# Patient Record
Sex: Female | Born: 1935 | Race: White | Hispanic: No | Smoking: Never smoker
Health system: Southern US, Community
[De-identification: ages and names within clinical notes are randomized; demographics above are authoritative.]

## PROBLEM LIST (undated history)

## (undated) DIAGNOSIS — I6350 Cerebral infarction due to unspecified occlusion or stenosis of unspecified cerebral artery: Secondary | ICD-10-CM

## (undated) DIAGNOSIS — I739 Peripheral vascular disease, unspecified: Secondary | ICD-10-CM

## (undated) DIAGNOSIS — I447 Left bundle-branch block, unspecified: Secondary | ICD-10-CM

## (undated) DIAGNOSIS — I5042 Chronic combined systolic (congestive) and diastolic (congestive) heart failure: Secondary | ICD-10-CM

## (undated) DIAGNOSIS — Z8619 Personal history of other infectious and parasitic diseases: Secondary | ICD-10-CM

## (undated) DIAGNOSIS — I6529 Occlusion and stenosis of unspecified carotid artery: Secondary | ICD-10-CM

## (undated) DIAGNOSIS — I10 Essential (primary) hypertension: Secondary | ICD-10-CM

## (undated) DIAGNOSIS — I350 Nonrheumatic aortic (valve) stenosis: Secondary | ICD-10-CM

## (undated) DIAGNOSIS — M1990 Unspecified osteoarthritis, unspecified site: Secondary | ICD-10-CM

## (undated) DIAGNOSIS — S82409A Unspecified fracture of shaft of unspecified fibula, initial encounter for closed fracture: Secondary | ICD-10-CM

## (undated) DIAGNOSIS — D509 Iron deficiency anemia, unspecified: Secondary | ICD-10-CM

## (undated) DIAGNOSIS — E785 Hyperlipidemia, unspecified: Secondary | ICD-10-CM

## (undated) DIAGNOSIS — I340 Nonrheumatic mitral (valve) insufficiency: Secondary | ICD-10-CM

## (undated) DIAGNOSIS — Z953 Presence of xenogenic heart valve: Secondary | ICD-10-CM

## (undated) HISTORY — DX: Cerebral infarction due to unspecified occlusion or stenosis of unspecified cerebral artery: I63.50

## (undated) HISTORY — DX: Left bundle-branch block, unspecified: I44.7

## (undated) HISTORY — DX: Iron deficiency anemia, unspecified: D50.9

## (undated) HISTORY — PX: PACEMAKER INSERTION: SHX728

## (undated) HISTORY — DX: Nonrheumatic aortic (valve) stenosis: I35.0

## (undated) HISTORY — DX: Nonrheumatic mitral (valve) insufficiency: I34.0

## (undated) HISTORY — PX: COLONOSCOPY: SHX174

## (undated) HISTORY — DX: Peripheral vascular disease, unspecified: I73.9

## (undated) HISTORY — DX: Occlusion and stenosis of unspecified carotid artery: I65.29

---

## 2000-04-03 ENCOUNTER — Encounter: Payer: Self-pay | Admitting: Internal Medicine

## 2000-04-03 ENCOUNTER — Encounter: Admission: RE | Admit: 2000-04-03 | Discharge: 2000-04-03 | Payer: Self-pay | Admitting: Internal Medicine

## 2001-04-09 ENCOUNTER — Encounter: Payer: Self-pay | Admitting: Internal Medicine

## 2001-04-09 ENCOUNTER — Encounter: Admission: RE | Admit: 2001-04-09 | Discharge: 2001-04-09 | Payer: Self-pay | Admitting: Internal Medicine

## 2001-04-30 ENCOUNTER — Other Ambulatory Visit: Admission: RE | Admit: 2001-04-30 | Discharge: 2001-04-30 | Payer: Self-pay | Admitting: Gynecology

## 2002-04-15 ENCOUNTER — Encounter: Payer: Self-pay | Admitting: Internal Medicine

## 2002-04-15 ENCOUNTER — Encounter: Admission: RE | Admit: 2002-04-15 | Discharge: 2002-04-15 | Payer: Self-pay | Admitting: Internal Medicine

## 2002-11-11 ENCOUNTER — Other Ambulatory Visit: Admission: RE | Admit: 2002-11-11 | Discharge: 2002-11-11 | Payer: Self-pay | Admitting: Gynecology

## 2003-04-21 ENCOUNTER — Encounter: Admission: RE | Admit: 2003-04-21 | Discharge: 2003-04-21 | Payer: Self-pay | Admitting: Internal Medicine

## 2003-04-21 ENCOUNTER — Encounter: Payer: Self-pay | Admitting: Internal Medicine

## 2003-11-17 ENCOUNTER — Other Ambulatory Visit: Admission: RE | Admit: 2003-11-17 | Discharge: 2003-11-17 | Payer: Self-pay | Admitting: Gynecology

## 2003-11-27 ENCOUNTER — Encounter: Admission: RE | Admit: 2003-11-27 | Discharge: 2003-11-27 | Payer: Self-pay | Admitting: Internal Medicine

## 2003-11-27 ENCOUNTER — Emergency Department (HOSPITAL_COMMUNITY): Admission: EM | Admit: 2003-11-27 | Discharge: 2003-11-28 | Payer: Self-pay | Admitting: Emergency Medicine

## 2003-12-10 ENCOUNTER — Ambulatory Visit (HOSPITAL_COMMUNITY): Admission: RE | Admit: 2003-12-10 | Discharge: 2003-12-10 | Payer: Self-pay | Admitting: Internal Medicine

## 2004-05-03 ENCOUNTER — Encounter: Admission: RE | Admit: 2004-05-03 | Discharge: 2004-05-03 | Payer: Self-pay | Admitting: Internal Medicine

## 2004-11-01 ENCOUNTER — Other Ambulatory Visit: Admission: RE | Admit: 2004-11-01 | Discharge: 2004-11-01 | Payer: Self-pay | Admitting: Gynecology

## 2004-11-13 ENCOUNTER — Ambulatory Visit: Payer: Self-pay | Admitting: Internal Medicine

## 2004-11-15 ENCOUNTER — Ambulatory Visit: Payer: Self-pay | Admitting: Internal Medicine

## 2004-12-06 ENCOUNTER — Ambulatory Visit: Payer: Self-pay | Admitting: Internal Medicine

## 2004-12-20 ENCOUNTER — Ambulatory Visit: Payer: Self-pay | Admitting: Internal Medicine

## 2004-12-27 ENCOUNTER — Ambulatory Visit: Payer: Self-pay | Admitting: Internal Medicine

## 2004-12-27 HISTORY — PX: POLYPECTOMY: SHX149

## 2005-05-09 ENCOUNTER — Encounter: Admission: RE | Admit: 2005-05-09 | Discharge: 2005-05-09 | Payer: Self-pay | Admitting: Internal Medicine

## 2005-08-15 ENCOUNTER — Ambulatory Visit: Payer: Self-pay | Admitting: Internal Medicine

## 2005-09-12 ENCOUNTER — Ambulatory Visit: Payer: Self-pay | Admitting: Internal Medicine

## 2005-10-31 ENCOUNTER — Ambulatory Visit: Payer: Self-pay | Admitting: Internal Medicine

## 2005-11-07 ENCOUNTER — Other Ambulatory Visit: Admission: RE | Admit: 2005-11-07 | Discharge: 2005-11-07 | Payer: Self-pay | Admitting: Gynecology

## 2005-12-12 ENCOUNTER — Ambulatory Visit: Payer: Self-pay | Admitting: Internal Medicine

## 2006-01-30 ENCOUNTER — Ambulatory Visit: Payer: Self-pay | Admitting: Internal Medicine

## 2006-03-02 ENCOUNTER — Ambulatory Visit: Payer: Self-pay | Admitting: Internal Medicine

## 2006-05-15 ENCOUNTER — Encounter: Admission: RE | Admit: 2006-05-15 | Discharge: 2006-05-15 | Payer: Self-pay | Admitting: Internal Medicine

## 2006-05-29 ENCOUNTER — Encounter: Admission: RE | Admit: 2006-05-29 | Discharge: 2006-05-29 | Payer: Self-pay | Admitting: Internal Medicine

## 2006-10-20 ENCOUNTER — Encounter: Payer: Self-pay | Admitting: Internal Medicine

## 2006-11-13 ENCOUNTER — Other Ambulatory Visit: Admission: RE | Admit: 2006-11-13 | Discharge: 2006-11-13 | Payer: Self-pay | Admitting: Gynecology

## 2006-11-20 ENCOUNTER — Ambulatory Visit: Payer: Self-pay | Admitting: Internal Medicine

## 2006-11-20 LAB — CONVERTED CEMR LAB
BUN: 12 mg/dL (ref 6–23)
Basophils Absolute: 0 10*3/uL (ref 0.0–0.1)
CO2: 30 meq/L (ref 19–32)
Calcium: 9.9 mg/dL (ref 8.4–10.5)
Chloride: 98 meq/L (ref 96–112)
Cholesterol: 171 mg/dL (ref 0–200)
Eosinophils Relative: 2.6 % (ref 0.0–5.0)
Glucose, Bld: 158 mg/dL — ABNORMAL HIGH (ref 70–99)
HCT: 33.8 % — ABNORMAL LOW (ref 36.0–46.0)
HDL: 85.8 mg/dL (ref >39.0)
LDL Cholesterol: 68 mg/dL (ref 0–99)
Lymphocytes Relative: 23.4 % (ref 12.0–46.0)
MCV: 89.5 fL (ref 78.0–100.0)
Neutro Abs: 5.5 10*3/uL (ref 1.4–7.7)
Neutrophils Relative %: 68.2 % (ref 43.0–77.0)
Nitrite: NEGATIVE
Platelets: 340 10*3/uL (ref 150–400)
Potassium: 4.6 meq/L (ref 3.5–5.1)
RBC: 3.78 M/uL — ABNORMAL LOW (ref 3.87–5.11)
Specific Gravity, Urine: 1.01 (ref 1.000–1.03)
TSH: 1.32 microintl units/mL (ref 0.35–5.50)
Total Protein: 6.9 g/dL (ref 6.0–8.3)
pH: 7.5 (ref 5.0–8.0)

## 2006-12-04 ENCOUNTER — Ambulatory Visit: Payer: Self-pay | Admitting: Internal Medicine

## 2006-12-08 ENCOUNTER — Ambulatory Visit: Payer: Self-pay

## 2006-12-10 ENCOUNTER — Ambulatory Visit: Payer: Self-pay | Admitting: Cardiology

## 2006-12-25 ENCOUNTER — Ambulatory Visit: Payer: Self-pay

## 2006-12-25 ENCOUNTER — Encounter: Payer: Self-pay | Admitting: Cardiology

## 2007-01-12 ENCOUNTER — Ambulatory Visit: Payer: Self-pay | Admitting: Cardiology

## 2007-01-12 LAB — CONVERTED CEMR LAB
CO2: 30 meq/L (ref 19–32)
Chloride: 98 meq/L (ref 96–112)
Eosinophils Absolute: 0.2 10*3/uL (ref 0.0–0.6)
GFR calc Af Amer: 105 mL/min
HCT: 33.5 % — ABNORMAL LOW (ref 36.0–46.0)
INR: 1 (ref 0.9–2.0)
Lymphocytes Relative: 25.6 % (ref 12.0–46.0)
Monocytes Relative: 6 % (ref 3.0–11.0)
Platelets: 282 10*3/uL (ref 150–400)
Potassium: 4 meq/L (ref 3.5–5.1)
Prothrombin Time: 11.8 s (ref 10.0–14.0)
RBC: 3.8 M/uL — ABNORMAL LOW (ref 3.87–5.11)
Sodium: 135 meq/L (ref 135–145)
WBC: 8 10*3/uL (ref 4.5–10.5)
aPTT: 25 s — ABNORMAL LOW (ref 26.5–36.5)

## 2007-01-14 ENCOUNTER — Ambulatory Visit: Payer: Self-pay | Admitting: Cardiology

## 2007-01-14 ENCOUNTER — Inpatient Hospital Stay (HOSPITAL_BASED_OUTPATIENT_CLINIC_OR_DEPARTMENT_OTHER): Admission: RE | Admit: 2007-01-14 | Discharge: 2007-01-14 | Payer: Self-pay | Admitting: Cardiology

## 2007-02-08 ENCOUNTER — Ambulatory Visit: Payer: Self-pay | Admitting: Cardiology

## 2007-05-21 ENCOUNTER — Ambulatory Visit: Payer: Self-pay | Admitting: Internal Medicine

## 2007-05-21 LAB — CONVERTED CEMR LAB
AST: 17 units/L (ref 0–37)
Glucose, Bld: 275 mg/dL — ABNORMAL HIGH (ref 70–99)
HDL: 87.6 mg/dL (ref >39.0)
Hgb A1c MFr Bld: 12.6 % — ABNORMAL HIGH (ref 4.6–6.0)
Total CHOL/HDL Ratio: 2.5
Triglycerides: 99 mg/dL (ref 0–149)
VLDL: 20 mg/dL (ref 0–40)

## 2007-06-04 ENCOUNTER — Encounter: Admission: RE | Admit: 2007-06-04 | Discharge: 2007-06-04 | Payer: Self-pay | Admitting: Internal Medicine

## 2007-06-18 ENCOUNTER — Ambulatory Visit: Payer: Self-pay | Admitting: Internal Medicine

## 2007-07-30 ENCOUNTER — Encounter: Payer: Self-pay | Admitting: Internal Medicine

## 2007-07-30 DIAGNOSIS — I739 Peripheral vascular disease, unspecified: Secondary | ICD-10-CM

## 2007-07-30 DIAGNOSIS — E1129 Type 2 diabetes mellitus with other diabetic kidney complication: Secondary | ICD-10-CM

## 2007-07-30 DIAGNOSIS — I10 Essential (primary) hypertension: Secondary | ICD-10-CM

## 2007-07-30 DIAGNOSIS — I447 Left bundle-branch block, unspecified: Secondary | ICD-10-CM | POA: Insufficient documentation

## 2007-07-30 DIAGNOSIS — E785 Hyperlipidemia, unspecified: Secondary | ICD-10-CM

## 2007-07-30 DIAGNOSIS — Z9189 Other specified personal risk factors, not elsewhere classified: Secondary | ICD-10-CM | POA: Insufficient documentation

## 2007-07-30 HISTORY — DX: Peripheral vascular disease, unspecified: I73.9

## 2007-09-10 ENCOUNTER — Telehealth: Payer: Self-pay | Admitting: Internal Medicine

## 2007-09-17 ENCOUNTER — Ambulatory Visit: Payer: Self-pay | Admitting: Internal Medicine

## 2007-09-17 DIAGNOSIS — E783 Hyperchylomicronemia: Secondary | ICD-10-CM | POA: Insufficient documentation

## 2007-09-17 DIAGNOSIS — T7841XA Arthus phenomenon, initial encounter: Secondary | ICD-10-CM | POA: Insufficient documentation

## 2007-09-17 LAB — CONVERTED CEMR LAB
AST: 19 units/L (ref 0–37)
Bilirubin, Direct: 0.1 mg/dL (ref 0.0–0.3)
CO2: 30 meq/L (ref 19–32)
Cholesterol: 170 mg/dL (ref 0–200)
GFR calc non Af Amer: 74 mL/min
Glucose, Bld: 148 mg/dL — ABNORMAL HIGH (ref 70–99)
Hgb A1c MFr Bld: 7.8 % — ABNORMAL HIGH (ref 4.6–6.0)
Sodium: 137 meq/L (ref 135–145)
Total CHOL/HDL Ratio: 1.8
Total Protein: 6.8 g/dL (ref 6.0–8.3)
Triglycerides: 50 mg/dL (ref 0–149)
VLDL: 10 mg/dL (ref 0–40)

## 2007-09-19 ENCOUNTER — Encounter: Payer: Self-pay | Admitting: Internal Medicine

## 2007-09-29 ENCOUNTER — Ambulatory Visit: Payer: Self-pay | Admitting: Internal Medicine

## 2007-09-29 DIAGNOSIS — B029 Zoster without complications: Secondary | ICD-10-CM | POA: Insufficient documentation

## 2007-10-21 HISTORY — PX: CATARACT EXTRACTION: SUR2

## 2007-10-22 ENCOUNTER — Ambulatory Visit: Payer: Self-pay | Admitting: Internal Medicine

## 2007-12-17 ENCOUNTER — Encounter: Payer: Self-pay | Admitting: Internal Medicine

## 2008-02-11 ENCOUNTER — Ambulatory Visit: Payer: Self-pay | Admitting: Internal Medicine

## 2008-02-24 ENCOUNTER — Encounter: Payer: Self-pay | Admitting: Internal Medicine

## 2008-02-24 ENCOUNTER — Ambulatory Visit: Payer: Self-pay | Admitting: Internal Medicine

## 2008-02-29 ENCOUNTER — Encounter: Payer: Self-pay | Admitting: Internal Medicine

## 2008-04-07 ENCOUNTER — Encounter (INDEPENDENT_AMBULATORY_CARE_PROVIDER_SITE_OTHER): Payer: Self-pay | Admitting: *Deleted

## 2008-04-07 ENCOUNTER — Ambulatory Visit: Payer: Self-pay | Admitting: Internal Medicine

## 2008-04-07 DIAGNOSIS — L259 Unspecified contact dermatitis, unspecified cause: Secondary | ICD-10-CM | POA: Insufficient documentation

## 2008-04-07 DIAGNOSIS — R609 Edema, unspecified: Secondary | ICD-10-CM

## 2008-04-07 LAB — CONVERTED CEMR LAB
ALT: 15 units/L (ref 0–35)
AST: 22 units/L (ref 0–37)
Albumin: 4.2 g/dL (ref 3.5–5.2)
CO2: 30 meq/L (ref 19–32)
Calcium: 9.7 mg/dL (ref 8.4–10.5)
Cholesterol: 171 mg/dL (ref 0–200)
GFR calc Af Amer: 78 mL/min
GFR calc non Af Amer: 65 mL/min
HDL: 85 mg/dL (ref >39.0)
Hgb A1c MFr Bld: 6.9 % — ABNORMAL HIGH (ref 4.6–6.0)
Potassium: 4.2 meq/L (ref 3.5–5.1)

## 2008-04-11 ENCOUNTER — Encounter: Payer: Self-pay | Admitting: Internal Medicine

## 2008-04-12 ENCOUNTER — Encounter: Payer: Self-pay | Admitting: Internal Medicine

## 2008-06-02 ENCOUNTER — Telehealth: Payer: Self-pay | Admitting: Internal Medicine

## 2008-06-09 ENCOUNTER — Encounter: Admission: RE | Admit: 2008-06-09 | Discharge: 2008-06-09 | Payer: Self-pay | Admitting: Internal Medicine

## 2008-06-16 ENCOUNTER — Ambulatory Visit: Payer: Self-pay | Admitting: Cardiology

## 2008-07-03 ENCOUNTER — Encounter: Payer: Self-pay | Admitting: Internal Medicine

## 2008-07-14 ENCOUNTER — Ambulatory Visit: Payer: Self-pay | Admitting: Internal Medicine

## 2008-08-25 ENCOUNTER — Ambulatory Visit: Payer: Self-pay | Admitting: Internal Medicine

## 2008-08-25 LAB — CONVERTED CEMR LAB
ALT: 14 units/L (ref 0–35)
Cholesterol: 170 mg/dL (ref 0–200)
HDL: 92.4 mg/dL (ref >39.0)
Hgb A1c MFr Bld: 6.8 % — ABNORMAL HIGH (ref 4.6–6.0)
Total CHOL/HDL Ratio: 1.8
Total Protein: 7.1 g/dL (ref 6.0–8.3)
Triglycerides: 28 mg/dL (ref 0–149)

## 2008-08-29 ENCOUNTER — Encounter: Payer: Self-pay | Admitting: Internal Medicine

## 2008-09-08 ENCOUNTER — Encounter: Payer: Self-pay | Admitting: Internal Medicine

## 2008-10-18 ENCOUNTER — Telehealth: Payer: Self-pay | Admitting: Internal Medicine

## 2008-12-22 ENCOUNTER — Encounter: Payer: Self-pay | Admitting: Internal Medicine

## 2008-12-29 ENCOUNTER — Ambulatory Visit: Payer: Self-pay

## 2009-01-05 ENCOUNTER — Encounter: Payer: Self-pay | Admitting: Cardiology

## 2009-01-05 ENCOUNTER — Ambulatory Visit: Payer: Self-pay | Admitting: Cardiology

## 2009-01-05 DIAGNOSIS — I6529 Occlusion and stenosis of unspecified carotid artery: Secondary | ICD-10-CM

## 2009-01-05 DIAGNOSIS — I739 Peripheral vascular disease, unspecified: Secondary | ICD-10-CM

## 2009-01-05 HISTORY — DX: Occlusion and stenosis of unspecified carotid artery: I65.29

## 2009-01-26 ENCOUNTER — Encounter: Payer: Self-pay | Admitting: Cardiology

## 2009-01-26 ENCOUNTER — Ambulatory Visit: Payer: Self-pay

## 2009-03-23 ENCOUNTER — Ambulatory Visit: Payer: Self-pay | Admitting: Cardiology

## 2009-03-23 DIAGNOSIS — I272 Pulmonary hypertension, unspecified: Secondary | ICD-10-CM | POA: Insufficient documentation

## 2009-06-29 ENCOUNTER — Encounter: Admission: RE | Admit: 2009-06-29 | Discharge: 2009-06-29 | Payer: Self-pay | Admitting: Internal Medicine

## 2009-07-06 ENCOUNTER — Ambulatory Visit: Payer: Self-pay | Admitting: Cardiology

## 2009-09-21 ENCOUNTER — Encounter: Payer: Self-pay | Admitting: Internal Medicine

## 2009-09-25 ENCOUNTER — Telehealth: Payer: Self-pay | Admitting: Internal Medicine

## 2009-10-05 ENCOUNTER — Telehealth: Payer: Self-pay | Admitting: Internal Medicine

## 2009-10-05 ENCOUNTER — Ambulatory Visit: Payer: Self-pay | Admitting: Internal Medicine

## 2009-10-20 HISTORY — PX: CAROTID ENDARTERECTOMY: SUR193

## 2009-10-26 LAB — CONVERTED CEMR LAB
ALT: 13 units/L (ref 0–35)
Bilirubin, Direct: 0.1 mg/dL (ref 0.0–0.3)
Calcium: 9.8 mg/dL (ref 8.4–10.5)
Cholesterol: 187 mg/dL (ref 0–200)
Creatinine, Ser: 0.9 mg/dL (ref 0.4–1.2)
Glucose, Bld: 140 mg/dL — ABNORMAL HIGH (ref 70–99)
LDL Cholesterol: 89 mg/dL (ref 0–99)
Sodium: 143 meq/L (ref 135–145)
TSH: 1.47 microintl units/mL (ref 0.35–5.50)
VLDL: 10.2 mg/dL (ref 0.0–40.0)

## 2009-10-28 ENCOUNTER — Encounter: Payer: Self-pay | Admitting: Internal Medicine

## 2009-12-21 ENCOUNTER — Ambulatory Visit: Payer: Self-pay

## 2009-12-21 ENCOUNTER — Encounter: Payer: Self-pay | Admitting: Cardiology

## 2009-12-28 ENCOUNTER — Encounter: Payer: Self-pay | Admitting: Internal Medicine

## 2010-02-21 ENCOUNTER — Encounter: Payer: Self-pay | Admitting: Cardiology

## 2010-02-21 DIAGNOSIS — I359 Nonrheumatic aortic valve disorder, unspecified: Secondary | ICD-10-CM | POA: Insufficient documentation

## 2010-02-22 ENCOUNTER — Ambulatory Visit: Payer: Self-pay | Admitting: Cardiology

## 2010-03-04 ENCOUNTER — Encounter: Payer: Self-pay | Admitting: Cardiology

## 2010-03-12 ENCOUNTER — Encounter: Payer: Self-pay | Admitting: Cardiology

## 2010-03-12 ENCOUNTER — Telehealth: Payer: Self-pay | Admitting: Internal Medicine

## 2010-03-14 ENCOUNTER — Ambulatory Visit: Payer: Self-pay | Admitting: Cardiology

## 2010-03-14 DIAGNOSIS — I6350 Cerebral infarction due to unspecified occlusion or stenosis of unspecified cerebral artery: Secondary | ICD-10-CM

## 2010-03-14 HISTORY — DX: Cerebral infarction due to unspecified occlusion or stenosis of unspecified cerebral artery: I63.50

## 2010-03-15 ENCOUNTER — Ambulatory Visit: Payer: Self-pay | Admitting: Cardiology

## 2010-03-15 LAB — CONVERTED CEMR LAB
CO2: 31 meq/L (ref 19–32)
Calcium: 9.9 mg/dL (ref 8.4–10.5)
Chloride: 103 meq/L (ref 96–112)
Creatinine, Ser: 1 mg/dL (ref 0.4–1.2)
Sodium: 139 meq/L (ref 135–145)

## 2010-03-21 ENCOUNTER — Ambulatory Visit: Payer: Self-pay | Admitting: Vascular Surgery

## 2010-03-21 ENCOUNTER — Encounter: Payer: Self-pay | Admitting: Cardiology

## 2010-03-22 ENCOUNTER — Ambulatory Visit: Payer: Self-pay | Admitting: Vascular Surgery

## 2010-03-22 ENCOUNTER — Encounter: Payer: Self-pay | Admitting: Vascular Surgery

## 2010-03-22 ENCOUNTER — Inpatient Hospital Stay (HOSPITAL_COMMUNITY): Admission: RE | Admit: 2010-03-22 | Discharge: 2010-03-23 | Payer: Self-pay | Admitting: Vascular Surgery

## 2010-04-04 ENCOUNTER — Encounter: Admission: RE | Admit: 2010-04-04 | Discharge: 2010-04-04 | Payer: Self-pay | Admitting: Vascular Surgery

## 2010-04-11 ENCOUNTER — Ambulatory Visit: Payer: Self-pay | Admitting: Vascular Surgery

## 2010-04-11 ENCOUNTER — Encounter: Payer: Self-pay | Admitting: Internal Medicine

## 2010-07-05 ENCOUNTER — Encounter: Admission: RE | Admit: 2010-07-05 | Discharge: 2010-07-05 | Payer: Self-pay | Admitting: Internal Medicine

## 2010-07-05 LAB — HM MAMMOGRAPHY: HM Mammogram: NEGATIVE

## 2010-09-01 ENCOUNTER — Encounter: Payer: Self-pay | Admitting: Internal Medicine

## 2010-09-05 ENCOUNTER — Encounter: Payer: Self-pay | Admitting: Cardiology

## 2010-09-06 ENCOUNTER — Ambulatory Visit: Payer: Self-pay | Admitting: Cardiology

## 2010-09-27 ENCOUNTER — Ambulatory Visit: Payer: Self-pay

## 2010-09-27 ENCOUNTER — Encounter: Payer: Self-pay | Admitting: Cardiology

## 2010-09-27 ENCOUNTER — Ambulatory Visit (HOSPITAL_COMMUNITY)
Admission: RE | Admit: 2010-09-27 | Discharge: 2010-09-27 | Payer: Self-pay | Source: Home / Self Care | Attending: Cardiology | Admitting: Cardiology

## 2010-10-09 ENCOUNTER — Telehealth: Payer: Self-pay | Admitting: Internal Medicine

## 2010-10-09 ENCOUNTER — Ambulatory Visit: Payer: Self-pay | Admitting: Vascular Surgery

## 2010-10-10 ENCOUNTER — Telehealth: Payer: Self-pay | Admitting: Cardiology

## 2010-10-20 HISTORY — PX: CARDIAC CATHETERIZATION: SHX172

## 2010-10-23 ENCOUNTER — Encounter: Payer: Self-pay | Admitting: Internal Medicine

## 2010-10-25 ENCOUNTER — Telehealth: Payer: Self-pay | Admitting: Internal Medicine

## 2010-11-10 ENCOUNTER — Encounter: Payer: Self-pay | Admitting: Internal Medicine

## 2010-11-15 ENCOUNTER — Other Ambulatory Visit: Payer: Self-pay | Admitting: Internal Medicine

## 2010-11-15 ENCOUNTER — Ambulatory Visit
Admission: RE | Admit: 2010-11-15 | Discharge: 2010-11-15 | Payer: Self-pay | Source: Home / Self Care | Attending: Internal Medicine | Admitting: Internal Medicine

## 2010-11-15 LAB — CBC WITH DIFFERENTIAL/PLATELET
Basophils Absolute: 0 10*3/uL (ref 0.0–0.1)
Basophils Relative: 0.2 % (ref 0.0–3.0)
Eosinophils Relative: 2.3 % (ref 0.0–5.0)
Hemoglobin: 8 g/dL — ABNORMAL LOW (ref 12.0–15.0)
Lymphocytes Relative: 21.8 % (ref 12.0–46.0)
Lymphs Abs: 2.1 10*3/uL (ref 0.7–4.0)
MCHC: 33.3 g/dL (ref 30.0–36.0)
MCV: 80.3 fl (ref 78.0–100.0)
Monocytes Relative: 6.7 % (ref 3.0–12.0)
Neutrophils Relative %: 69 % (ref 43.0–77.0)
Platelets: 272 10*3/uL (ref 150.0–400.0)
RBC: 3.01 Mil/uL — ABNORMAL LOW (ref 3.87–5.11)
WBC: 9.8 10*3/uL (ref 4.5–10.5)

## 2010-11-15 LAB — BASIC METABOLIC PANEL: Calcium: 10.5 mg/dL (ref 8.4–10.5)

## 2010-11-15 LAB — LIPID PANEL
Cholesterol: 190 mg/dL (ref 0–200)
LDL Cholesterol: 75 mg/dL (ref 0–99)
Total CHOL/HDL Ratio: 2

## 2010-11-15 LAB — HEPATIC FUNCTION PANEL
ALT: 14 U/L (ref 0–35)
AST: 21 U/L (ref 0–37)
Total Bilirubin: 0.6 mg/dL (ref 0.3–1.2)

## 2010-11-21 NOTE — Letter (Signed)
Summary: Chief Financial Officer Insurance Certification Notification   Imported By: Roderic Ovens 05/07/2010 11:52:18  _____________________________________________________________________  External Attachment:    Type:   Image     Comment:   External Document

## 2010-11-21 NOTE — Letter (Signed)
Summary: [facility]  [facility] [place] [facility]   Imported By: Sherian Rein 04/30/2010 08:32:12  _____________________________________________________________________  External Attachment:    Type:   Image     Comment:   External Document

## 2010-11-21 NOTE — Miscellaneous (Signed)
  Clinical Lists Changes  Problems: Removed problem of SYSTOLIC MURMUR (ZOX-096.0) - There is aortic valve sclerosis. Added new problem of AORTIC VALVE SCLEROSIS (ICD-424.1) Added new problem of EDEMA (ICD-782.3) Observations: Added new observation of PAST MED HX: PERIPHERAL VASCULAR DISEASE (ICD-443.9) DM (ICD-250.00) HYPERTENSION (ICD-401.9) HYPERLIPIDEMIA (ICD-272.4) LBBB SYSTOLIC MURMUR (AVW-098.1) Carotid artery disease.... Doppler... September, 2010.Marland KitchenMarland KitchenMarland Kitchen 40-59% RICA, 60-79% ( high in the range, but stable) LICA  CARDIAC CATH MARCH '08 ... minimal coronary plaque STEROID ALLERGY Edema.... mild chronic EF 65-70%.... echo.... February, 2008 Aortic valve sclerosis but no stenosis..... echo.... February 2 008 Pulmonary hypertension...Marland KitchenMarland Kitchen PA pressure 60 mmHg.... echo.... February, 2008      (02/21/2010 10:43) Added new observation of REFERRING MD: Illene Regulus M.D. (02/21/2010 10:43) Added new observation of PRIMARY MD: Jacques Navy MD (02/21/2010 10:43)       Past History:  Past Medical History: PERIPHERAL VASCULAR DISEASE (ICD-443.9) DM (ICD-250.00) HYPERTENSION (ICD-401.9) HYPERLIPIDEMIA (ICD-272.4) LBBB SYSTOLIC MURMUR (XBJ-478.2) Carotid artery disease.... Doppler... September, 2010.Marland KitchenMarland KitchenMarland Kitchen 40-59% RICA, 60-79% ( high in the range, but stable) LICA  CARDIAC CATH [facility]ARCH '08 ... minimal coronary plaque STEROID ALLERGY Edema.... mild chronic EF 65-70%.... echo.... February, 2008 Aortic valve sclerosis but no stenosis..... echo.... February 2 008 Pulmonary hypertension...Marland KitchenMarland Kitchen PA pressure 60 mmHg.... echo.... February, 2008

## 2010-11-21 NOTE — Medication Information (Signed)
Summary: [facility]  [facility]   Imported By: Sherian Rein 09/04/2010 10:18:43  _____________________________________________________________________  External Attachment:    Type:   Image     Comment:   External Document

## 2010-11-21 NOTE — Progress Notes (Signed)
Summary: pt rtn call   Phone Note Call from Patient Call back at Home Phone [phone]   Caller: Patient Reason for Call: Talk to Nurse, Talk to Doctor Summary of Call: pt rtn call to get results and she is at work and would like [place] to leave the message on her home phone because we will not be able to get her at work Initial call taken by: Omer Jack,  October 10, 2010 9:09 AM  Follow-up for Phone Call        left mess of echo and carotid results on machine Meredith Staggers, RN  October 10, 2010 9:55 AM

## 2010-11-21 NOTE — Miscellaneous (Signed)
Summary: Orders Update  Clinical Lists Changes 

## 2010-11-21 NOTE — Progress Notes (Signed)
  Phone Note Refill Request Message from:  Fax from Pharmacy on Mar 12, 2010 11:47 AM  Refills Requested: Medication #1:  LOSARTAN POTASSIUM-HCTZ 100-12.5 MG TABS 1 by mouth once daily Initial call taken by: Ami Bullins CMA,  Mar 12, 2010 11:47 AM    Prescriptions: LOSARTAN POTASSIUM-HCTZ 100-12.5 MG TABS (LOSARTAN POTASSIUM-HCTZ) 1 by mouth once daily  #30 x 0   Entered by:   Ami Bullins CMA   Authorized by:   Jacques Navy MD   Signed by:   Bill Salinas CMA on 03/12/2010   Method used:   Electronically to        [facility] [address] [phone]* (retail)       [address]       [place], [place]  [address]       Ph: [phone] or [phone]       Fax: [phone]   RxID:   [id number]

## 2010-11-21 NOTE — Miscellaneous (Signed)
Summary: Orders Update  Clinical Lists Changes  Orders: Added new Referral order of CT Scan  (CT Scan) - Signed 

## 2010-11-21 NOTE — Letter (Signed)
Summary: Beather Arbour MD  Beather Arbour MD   Imported By: Sherian Rein 01/03/2010 08:37:33  _____________________________________________________________________  External Attachment:    Type:   Image     Comment:   External Document

## 2010-11-21 NOTE — Miscellaneous (Signed)
Summary: Orders Update  Clinical Lists Changes  Orders: Added new Test order of Carotid Duplex (Carotid Duplex) - Signed 

## 2010-11-21 NOTE — Progress Notes (Signed)
Summary: RF  Phone Note Call from Patient Call back at [facility] Phone [phone]   Summary of Call: Pt left vm req refill but did not give name of med.  Initial call taken by: Lamar Sprinkles, CMA,  October 09, 2010 10:03 AM  Follow-up for Phone Call        left mess to call office back w/info Follow-up by: Lamar Sprinkles, CMA,  October 09, 2010 11:04 AM  Additional Follow-up for Phone Call Additional follow up Details #1::        Pt informed  Additional Follow-up by: Lamar Sprinkles, CMA,  October 09, 2010 3:16 PM    Prescriptions: KLOR-CON M20 20 MEQ  TBCR (POTASSIUM CHLORIDE CRYS CR) once daily  #90 x 3   Entered by:   Lamar Sprinkles, CMA   Authorized by:   Jacques Navy MD   Signed by:   Lamar Sprinkles, CMA on 10/09/2010   Method used:   Faxed to ...       [facility])             , [place]         Ph: [phone]       Fax: [phone]   RxID:   [id number]

## 2010-11-21 NOTE — Miscellaneous (Signed)
  Clinical Lists Changes  Observations: Added new observation of PAST MED HX: PERIPHERAL VASCULAR DISEASE (ICD-443.9) DM (ICD-250.00) HYPERTENSION (ICD-401.9) HYPERLIPIDEMIA (ICD-272.4) LBBB SYSTOLIC MURMUR (WUJ-811.9) Carotid artery disease.... Doppler... September, 2010.Marland KitchenMarland KitchenMarland Kitchen 40-59% RICA, 60-79% ( high in the range, but stable) LICA /  Doppler... march, 2011... stable, 40-59% RICA, 60-79% LICA ( high and the range), bilateral ostial subclavian artery stenoses, there was a recommendation to consider CTA to further assess ICA stenosis and rule out surgical disease....  /  CTA abnormal..... carotid endarterectomy... June, 2011... Dr. Edilia Bo  CARDIAC CATH [facility] '08 ... minimal coronary plaque STEROID ALLERGY Edema.... mild chronic EF 65-70%.... echo.... February, 2008 Aortic valve sclerosis but no stenosis..... echo.... February 2 008 Pulmonary hypertension...Marland KitchenMarland Kitchen PA pressure 60 mmHg.... echo.... February, 2008      (09/05/2010 13:20) Added new observation of REFERRING MD: Illene Regulus M.D. (09/05/2010 13:20) Added new observation of PRIMARY MD: Jacques Navy MD (09/05/2010 13:20)       Past History:  Past Medical History: PERIPHERAL VASCULAR DISEASE (ICD-443.9) DM (ICD-250.00) HYPERTENSION (ICD-401.9) HYPERLIPIDEMIA (ICD-272.4) LBBB SYSTOLIC MURMUR (JYN-829.5) Carotid artery disease.... Doppler... September, 2010.Marland KitchenMarland KitchenMarland Kitchen 40-59% RICA, 60-79% ( high in the range, but stable) LICA /  Doppler... march, 2011... stable, 40-59% RICA, 60-79% LICA ( high and the range), bilateral ostial subclavian artery stenoses, there was a recommendation to consider CTA to further assess ICA stenosis and rule out surgical disease....  /  CTA abnormal..... carotid endarterectomy... June, 2011... Dr. Edilia Bo  CARDIAC CATH [facility] '08 ... minimal coronary plaque STEROID ALLERGY Edema.... mild chronic EF 65-70%.... echo.... February, 2008 Aortic valve sclerosis but no stenosis..... echo.... February 2  008 Pulmonary hypertension...Marland KitchenMarland Kitchen PA pressure 60 mmHg.... echo.... February, 2008

## 2010-11-21 NOTE — Assessment & Plan Note (Signed)
Summary: 6 month      Allergies Added:   Visit Type:  Follow-up Primary Provider:  Jacques Navy MD  CC:  aortic valve disease.  History of Present Illness: The patient is seen for followup of hypertension, aortic valve disease, carotid artery disease.  When I saw her last we were concerned about her carotid status.  Ultimately she had a carotid endarterectomy in June, 2011.  She has done very well.  Her last echo was done in 2008.  She had aortic valve sclerosis but no significant stenosis.  She does have pulmonary hypertension.  She's not had chest pain or shortness of breath.  Current Medications (verified): 1)  Citracal + D 250-200 Mg-Unit  Tabs (Calcium Citrate-Vitamin D) .... Four Times A Day 2)  Cartia Xt 180 Mg  Cp24 (Diltiazem Hcl Coated Beads) .... Once Daily 3)  Klor-Con M20 20 Meq  Tbcr (Potassium Chloride Crys Cr) .... Once Daily 4)  Losartan Potassium-Hctz 100-12.5 Mg Tabs (Losartan Potassium-Hctz) .Marland Kitchen.. 1 By Mouth Once Daily 5)  Metformin Hcl 1000 Mg  Tabs (Metformin Hcl) .... Take 1 Tablet By Mouth Two Times A Day 6)  Simvastatin 80 Mg Tabs (Simvastatin) .Marland Kitchen.. 1 By Mouth Once Daily 7)  Vitamin D 1000 Unit  Tabs (Cholecalciferol) .... Once Daily 8)  Adult Aspirin Low Strength 81 Mg  Tbdp (Aspirin) .... Once Daily 9)  Garlique 400 Mg  Tbec (Garlic) .... Once Daily 10)  Fish Oil 1000 Mg  Caps (Omega-3 Fatty Acids) .... Once Daily 11)  Actos 45 Mg  Tabs (Pioglitazone Hcl) .Marland Kitchen.. 1 Once Daily 30  Allergies (verified): 1)  Steroids  Past History:  Past Medical History: PERIPHERAL VASCULAR DISEASE  DM  HYPERTENSION  HYPERLIPIDEMIA LBBB SYSTOLIC MURMUR Carotid artery disease.... Doppler... September, 2010.Marland KitchenMarland KitchenMarland Kitchen 40-59% RICA, 60-79% ( high in the range, but stable) LICA /  Doppler... march, 2011... stable, 40-59% RICA, 60-79% LICA ( high and the range), bilateral ostial subclavian artery stenoses, there was a recommendation to consider CTA to further assess ICA stenosis and  rule out surgical disease....  /  CTA abnormal..... carotid endarterectomy... June, 2011... Dr. Edilia Bo  CARDIAC CATH [facility] '08 ... minimal coronary plaque STEROID ALLERGY Edema.... mild chronic EF 65-70%.... echo.... February, 2008 Aortic valve sclerosis but no stenosis..... echo.... February 2 008 Pulmonary hypertension...Marland KitchenMarland Kitchen PA pressure 60 mmHg.... echo.... February, 2008  Review of Systems       Patient denies fever, chills, headache, sweats, rash, change in vision, hearing, chest pain, cough, nausea vomiting, urinary symptoms.  All other systems are reviewed and are negative.  Vital Signs:  Patient profile:   77 year old female Height:      62 inches Weight:      159 pounds BMI:     29.19 Pulse rate:   85 / minute BP sitting:   192 / 68  (left arm) Cuff size:   regular  Vitals Entered By: Hardin Negus, RMA (September 06, 2010 12:02 PM)  Physical Exam  General:  patient is stable today. Head:  head is atraumatic. Eyes:  no xanthelasma. Neck:  patient has a radiated murmur to the neck.  Her left carotid endarterectomy site is nicely healed. Chest Wall:  no chest wall tenderness. Lungs:  lungs are clear.  Respiratory effort is nonlabored. Heart:  cardiac exam reveals S1-S2.  There is a 3/6 crescendo decrescendo systolic murmur compatible with aortic valve disease. Abdomen:  abdomen is soft. Msk:  no musculoskeletal deformities. Extremities:  no peripheral edema. Skin:  no skin rashes. Psych:  patient is oriented to person time and place.  Affect is normal.   Impression & Recommendations:  Problem # 1:  EDEMA (ICD-782.3) The patient has no edema.  No further workup needed.  Problem # 2:  AORTIC VALVE SCLEROSIS (ICD-424.1)  Her updated medication list for this problem includes:    Losartan Potassium-hctz 100-12.5 Mg Tabs (Losartan potassium-hctz) .Marland Kitchen... 1 by mouth once daily The patient's aortic murmur is more prominent.  Her last echo was done 3 years ago.  We will  schedule a followup 2-D echo to reassess LV function and the status of her aortic valve.  I'll be in touch with her with the information.  Orders: Echocardiogram (Echo)  Problem # 3:  PULMONARY HYPERTENSION (ICD-416.8) There is a history of pulmonary hypertension.  Will obtain more information from her echo.  Problem # 4:  CAROTID ARTERY STENOSIS (ICD-433.10)  Her updated medication list for this problem includes:    Adult Aspirin Low Strength 81 Mg Tbdp (Aspirin) ..... Once daily The patient had successful carotid endarterectomy in June, 2011.  She is followed carefully by Dr. Edilia Bo.  Problem # 5:  HYPERTENSION (ICD-401.9)  Her updated medication list for this problem includes:    Cartia Xt 180 Mg Cp24 (Diltiazem hcl coated beads) ..... Once daily    Losartan Potassium-hctz 100-12.5 Mg Tabs (Losartan potassium-hctz) .Marland Kitchen... 1 by mouth once daily    Adult Aspirin Low Strength 81 Mg Tbdp (Aspirin) ..... Once daily blood pressure is higher than I want to see it today.  By history there is white coat syndrome.  She insists that her pressure is always normal at [employer].  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  Other Orders: EKG w/ Interpretation (93000)  Patient Instructions: 1)  Your physician has requested that you have an echocardiogram.  Echocardiography is a painless test that uses sound waves to create images of your heart. It provides your doctor with information about the size and shape of your heart and how well your heart's chambers and valves are working.  This procedure takes approximately one hour. There are no restrictions for this procedure. 2)  Monitor your BP at [employer] and give me a call with some readings in a few weeks. 3)  Your physician wants you to follow-up in: 6 months.  You will receive a reminder letter in the mail two months in advance. If you don't receive a letter, please call  our office to schedule the follow-up appointment.

## 2010-11-21 NOTE — Letter (Signed)
Summary: [facility]   Imported By: Lennie Odor 11/08/2010 14:49:02  _____________________________________________________________________  External Attachment:    Type:   Image     Comment:   External Document

## 2010-11-21 NOTE — Progress Notes (Signed)
     Diabetes Management Exam:    Eye Exam:       Eye Exam done elsewhere          Date: 10/04/2010          Results: normal          Done by: Dr Ernesto Rutherford

## 2010-11-21 NOTE — Assessment & Plan Note (Signed)
Summary: rov      Allergies Added:   Visit Type:  Follow-up Primary Provider:  Jacques Navy MD  CC:  aortic valve disease.  History of Present Illness: The patient is seen for followup aortic valve disease, carotid artery disease, and pulmonary hypertension.  She feels well.  She is fully active.  She is not having chest pain or shortness of breath.  There is been no syncope or presyncope.  She has significant carotid artery disease. Her last carotid Doppler was done December 21, 2009.  It appeared to be stable.  However she does have significant disease.  There was a recommendation to consider CT angiography for further assessment.  Current Medications (verified): 1)  Citracal + D 250-200 Mg-Unit  Tabs (Calcium Citrate-Vitamin D) .... Four Times A Day 2)  Cartia Xt 180 Mg  Cp24 (Diltiazem Hcl Coated Beads) .... Once Daily 3)  Klor-Con M20 20 Meq  Tbcr (Potassium Chloride Crys Cr) .... Once Daily 4)  Losartan Potassium-Hctz 100-12.5 Mg Tabs (Losartan Potassium-Hctz) .Marland Kitchen.. 1 By Mouth Once Daily 5)  Metformin Hcl 1000 Mg  Tabs (Metformin Hcl) .... Take 1 Tablet By Mouth Two Times A Day 6)  Simvastatin 80 Mg Tabs (Simvastatin) .Marland Kitchen.. 1 By Mouth Once Daily 7)  Vitamin D 1000 Unit  Tabs (Cholecalciferol) .... Once Daily 8)  Adult Aspirin Low Strength 81 Mg  Tbdp (Aspirin) .... Once Daily 9)  Garlique 400 Mg  Tbec (Garlic) .... Once Daily 10)  Fish Oil 1000 Mg  Caps (Omega-3 Fatty Acids) .... Once Daily 11)  Actos 45 Mg  Tabs (Pioglitazone Hcl) .Marland Kitchen.. 1 Once Daily 30  Allergies (verified): 1)  Steroids  Past History:  Past Medical History: PERIPHERAL VASCULAR DISEASE (ICD-443.9) DM (ICD-250.00) HYPERTENSION (ICD-401.9) HYPERLIPIDEMIA (ICD-272.4) LBBB SYSTOLIC MURMUR (WUJ-811.9) Carotid artery disease.... Doppler... September, 2010.Marland KitchenMarland KitchenMarland Kitchen 40-59% RICA, 60-79% ( high in the range, but stable) LICA /  Doppler... march, 2011... stable, 40-59% RICA, 60-79% LICA ( high and the range), bilateral  ostial subclavian artery stenoses, there was a recommendation to consider CTA to further assess ICA stenosis and rule out surgical disease.  CARDIAC CATH [facility] '08 ... minimal coronary plaque STEROID ALLERGY Edema.... mild chronic EF 65-70%.... echo.... February, 2008 Aortic valve sclerosis but no stenosis..... echo.... February 2 008 Pulmonary hypertension...Marland KitchenMarland Kitchen PA pressure 60 mmHg.... echo.... February, 2008      Review of Systems       Patient denies fever, chills, headache, sweats, rash, change in vision, change in hearing, chest pain, cough, nausea vomiting, urinary symptoms.  All other systems are reviewed and are negative.  Vital Signs:  Patient profile:   77 year old female Height:      62 inches Weight:      160 pounds BMI:     29.37 Pulse rate:   94 / minute BP sitting:   184 / 62  (left arm) Cuff size:   regular  Vitals Entered By: Hardin Negus, RMA (Feb 22, 2010 11:38 AM)  Physical Exam  General:  patient is quite stable today. Head:  head is atraumatic. Eyes:  no xanthelasma. Neck:  there are carotid bruits. Chest Wall:  no chest wall tenderness. Lungs:  lungs are clear.  Respiratory effort is nonlabored. Heart:  cardiac evaluation reveals an S1 and S2.  There is a 3/6 systolic murmur. Abdomen:  abdomen is soft. Msk:  no musculoskeletal deformities. Extremities:  no peripheral edema. Skin:  no skin rashes. Psych:  patient is oriented to person time  and place.  Affect is normal.   Impression & Recommendations:  Problem # 1:  AORTIC VALVE SCLEROSIS (ICD-424.1) The patient has a history of aortic valve sclerosis.  By physical exam she may have some aortic stenosis now.  She has not had an echo since 2008 but is stable.  There no symptoms.  I will look into further echo evaluation over time.  Problem # 2:  PULMONARY HYPERTENSION (ICD-416.8)  Patient has pulmonary hypertension and has been stable.  No change in therapy.  Orders: Cardiac CTA (Cardiac  CTA)  Problem # 3:  CAROTID ARTERY STENOSIS (ICD-433.10)  Her updated medication list for this problem includes:    Adult Aspirin Low Strength 81 Mg Tbdp (Aspirin) ..... Once daily The patient has severe carotid disease.  As outlined in her carotid Doppler report we need to proceed with CT angiography to further assess her anatomy.  This will be scheduled.  Orders: Cardiac CTA (Cardiac CTA)  Problem # 4:  BUNDLE BRANCH BLOCK, LEFT (ICD-426.3)  Her updated medication list for this problem includes:    Cartia Xt 180 Mg Cp24 (Diltiazem hcl coated beads) ..... Once daily    Adult Aspirin Low Strength 81 Mg Tbdp (Aspirin) ..... Once daily There is history of left bundle branch block.  EKG is done today and reviewed by me.  She has left bundle with sinus rhythm.  No further workup.  Problem # 5:  HYPERTENSION (ICD-401.9)  Her updated medication list for this problem includes:    Cartia Xt 180 Mg Cp24 (Diltiazem hcl coated beads) ..... Once daily    Losartan Potassium-hctz 100-12.5 Mg Tabs (Losartan potassium-hctz) .Marland Kitchen... 1 by mouth once daily    Adult Aspirin Low Strength 81 Mg Tbdp (Aspirin) ..... Once daily Systolic blood pressure is mildly elevated today.  She has white coat syndrome.  Her pressure when checked at [employer] is always normal  Other Orders: EKG w/ Interpretation (93000)  Patient Instructions: 1)  Non-Cardiac CT Angiography (CTA), is a special type of CT scan that uses a computer to produce multi-dimensional views of major blood vessels throughout the body. In CT angiography, a contrast material is injected through an IV to help visualize the blood vessels. 2)  Your physician recommends that you schedule a follow-up appointment in: 6 MONTHS WITH DR.Staley Budzinski 3)  Your physician recommends that you continue on your current medications as directed. Please refer to the Current Medication list given to you today.

## 2010-11-21 NOTE — Letter (Signed)
Summary: Chief Financial Officer Insurance Certification Notification   Imported By: Roderic Ovens 05/07/2010 11:51:14  _____________________________________________________________________  External Attachment:    Type:   Image     Comment:   External Document

## 2010-11-21 NOTE — Letter (Signed)
   [facility] [address] [place], [place]  [address] Phone: [phone]      October 29, 2009   Eleen Mott [address] [place], [place] [address]  RE:  LAB RESULTS  Dear  Ms. Fors,  The following is an interpretation of your most recent lab tests.  Please take note of any instructions provided or changes to medications that have resulted from your lab work.  ELECTROLYTES:  Good - no changes needed  KIDNEY FUNCTION TESTS:  Good - no changes needed  LIVER FUNCTION TESTS:  Good - no changes needed  LIPID PANEL:  Good - no changes needed Triglyceride: 51.0   Cholesterol: 187   LDL: 89   HDL: 87.50   Chol/HDL%:  2  THYROID STUDIES:  Thyroid studies normal TSH: 1.47     DIABETIC STUDIES:  Good - no changes needed Blood Glucose: 140   HgbA1C: 6.8      Lab results are fine.    Sincerely Yours,    Jacques Navy MD

## 2010-11-21 NOTE — Assessment & Plan Note (Signed)
Summary:  Cardiology    Referring Provider:  Illene Regulus M.D. Primary Provider:  Jacques Navy MD   History of Present Illness: Yesterday I reviewed carotid CTA. There is a tight stenosis. I spoke with DR Edilia Bo who will see the patient soon. I gave him my office note and CTA report.  Allergies: 1)  Steroids

## 2010-11-27 NOTE — Assessment & Plan Note (Signed)
Summary: PER YEARLY--D/T---STC   Vital Signs:  Patient profile:   77 year old female Height:      64 inches Weight:      160 pounds BMI:     27.56 O2 Sat:      96 % Temp:     99.1 degrees F Pulse rate:   84 / minute BP sitting:   160 / 70  (left arm) Cuff size:   regular  Vitals Entered By: Lamar Sprinkles, CMA (November 15, 2010 1:53 PM) CC: Yearly exam   Primary Care Provider:  Jacques Navy MD  CC:  Yearly exam.  History of Present Illness: Patient presents for a medicare wellness exam. Her history is significant for left Endarterectomy with Dacron Patch angioplasty in June '11 for a greater than 80% blocked Left ICA. She did very well with surgery and had a follow-up carotid doppler Dec 9,'11 with a 40-59% RICA and LICA looked good. She has had follow-up with the Vascular surgeons Jan 4th , '12 with a very good report.   She has seen Dr. Myrtis Ser as recently as Dec 9th,'11 and had a good report. She had a 2 Decho with an EF 55-60% and normal wall motion.   She remains 100% independent in all ADLs. She has had no falls. She is cognitively intact and continues to work full-time at [employer]. She has no signs of depression .  Preventive Screening-Counseling & Management  Alcohol-Tobacco     Alcohol drinks/day: 0     Smoking Status: never  Caffeine-Diet-Exercise     Caffeine use/day: 2 -3 cups of coffee a day     Diet Comments: healthy diet     Does Patient Exercise: yes     Type of exercise: walking     Exercise (avg: min/session): 30-60     Times/week: 7  Hep-HIV-STD-Contraception     Hepatitis Risk: no risk noted     HIV Risk: no risk noted     STD Risk: no risk noted     Dental Visit-last 6 months yes     SBE monthly: yes     Sun Exposure-Excessive: no  Safety-Violence-Falls     Seat Belt Use: yes     Helmet Use: n/a     Firearms in the Home: no firearms in the home     Smoke Detectors: no     Violence in the Home: no risk noted     Sexual Abuse: no   Drug Use:  never.        Blood Transfusions:  no.    Current Medications (verified): 1)  Citracal + D 250-200 Mg-Unit  Tabs (Calcium Citrate-Vitamin D) .... Four Times A Day 2)  Cartia Xt 180 Mg  Cp24 (Diltiazem Hcl Coated Beads) .... Once Daily 3)  Klor-Con M20 20 Meq  Tbcr (Potassium Chloride Crys Cr) .... Once Daily 4)  Losartan Potassium-Hctz 100-12.5 Mg Tabs (Losartan Potassium-Hctz) .Marland Kitchen.. 1 By Mouth Once Daily 5)  Metformin Hcl 1000 Mg  Tabs (Metformin Hcl) .... Take 1 Tablet By Mouth Two Times A Day 6)  Simvastatin 80 Mg Tabs (Simvastatin) .Marland Kitchen.. 1 By Mouth Once Daily 7)  Vitamin D 1000 Unit  Tabs (Cholecalciferol) .... Once Daily 8)  Adult Aspirin Low Strength 81 Mg  Tbdp (Aspirin) .... Once Daily 9)  Garlique 400 Mg  Tbec (Garlic) .... Once Daily 10)  Fish Oil 1000 Mg  Caps (Omega-3 Fatty Acids) .... Once Daily 11)  Actos 45 Mg  Tabs (  Pioglitazone Hcl) .Marland Kitchen.. 1 Once Daily 30  Allergies (verified): 1)  Steroids  Past History:  Family History: Last updated: 01/04/2009 father - ? never met mother - deceased @67 : colon cancer Neg-breast cancer, CAD, DM  Social History: Last updated: 11/15/2010  Patient lives alone  here in town.  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  She is divorced after 23 years of marriage. 1 son -'26 -minister; 1 daughter ' 69: 4 grandchildren End of life care - provided packet on living will.    . She does not smoke.  Past Medical History: PERIPHERAL VASCULAR DISEASE  DM  HYPERTENSION  HYPERLIPIDEMIA LBBB SYSTOLIC MURMUR Carotid artery disease.... Doppler... September, 2010.Marland KitchenMarland KitchenMarland Kitchen 40-59% RICA, 60-79% ( high in the range, but stable) LICA /  Doppler... march, 2011... stable, 40-59% RICA, 60-79% LICA ( high and the range), bilateral ostial subclavian artery stenoses, there was a recommendation to consider CTA to further assess ICA stenosis and rule out surgical disease....  /  CTA abnormal..... carotid endarterectomy... June, 2011...  Dr. Edilia Bo  CARDIAC CATH [facility] '08 ... minimal coronary plaque STEROID ALLERGY Edema.... mild chronic EF 65-70%.... echo.... February, 2008 Aortic valve sclerosis but no stenosis..... echo.... February 2 008 Pulmonary hypertension...Marland KitchenMarland Kitchen PA pressure 60 mmHg.... echo.... February, 2008 h/o shingles  Past Surgical History: POLYPECTOMY, HX OF (ICD-V15.9) (12/27/2004) Cataract extraction/iol  '09 Left Carotid edarterectomy with dacron patch June '11 (Dr. Edilia Bo)  Social History:  Patient lives alone  here in town.  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  She is divorced after 23 years of marriage. 1 son -'50 -minister; 1 daughter ' 69: 4 grandchildren End of life care - provided packet on living will.    . She does not smoke. Caffeine use/day:  2 -3 cups of coffee a day Dental Care w/in 6 mos.:  yes Sun Exposure-Excessive:  no Seat Belt Use:  yes Drug Use:  never Blood Transfusions:  no Hepatitis Risk:  no risk noted STD Risk:  no risk noted HIV Risk:  no risk noted  Review of Systems  The patient denies anorexia, fever, weight loss, weight gain, hoarseness, chest pain, dyspnea on exertion, prolonged cough, hemoptysis, abdominal pain, incontinence, muscle weakness, suspicious skin lesions, difficulty walking, unusual weight change, and angioedema.    Physical Exam  General:  Well-developed,well-nourished,in no acute distress; alert,appropriate and cooperative throughout examination Head:  normocephalic, atraumatic, and no abnormalities observed.   Eyes:  vision grossly intact, pupils equal, pupils round, corneas and lenses clear, and no injection.   Ears:  R ear normal and L ear normal.   Nose:  no external deformity and no external erythema.   Mouth:   o buccal or palatal lesions. Posterior pharynx clear Neck:  supple, full ROM, and no thyromegaly.  II/VI murmur right neck Chest Wall:  no deformities.   Breasts:  deferred to mammogram Lungs:  Normal  respiratory effort, chest expands symmetrically. Lungs are clear to auscultation, no crackles or wheezes. Heart:  Normal rate and regular rhythm. S1 and S2 normal without gallop, murmur, click, rub or other extra sounds. Abdomen:  soft, non-tender, normal bowel sounds, and no guarding.   Genitalia:  deferred to age Msk:  normal ROM, no joint tenderness, no joint swelling, no joint warmth, and no joint deformities.   Pulses:  2+ radial  Extremities:  No clubbing, cyanosis, edema, or deformity noted with normal full range of motion of all joints.   Neurologic:  alert & oriented  X3, cranial nerves II-XII intact, gait normal, and DTRs symmetrical and normal.   Skin:  turgor normal, color normal, no rashes, and no suspicious lesions.   Cervical Nodes:  no anterior cervical adenopathy and no posterior cervical adenopathy.   Psych:  Oriented X3, memory intact for recent and remote, normally interactive, good eye contact, and not anxious appearing.     Impression & Recommendations:  Problem # 1:  CVA (ICD-434.91) Very stable with a normal exam. No report of any recent symptoms.  Her updated medication list for this problem includes:    Adult Aspirin Low Strength 81 Mg Tbdp (Aspirin) ..... Once daily  Problem # 2:  CAROTID ARTERY STENOSIS (ICD-433.10) Did very well with left CE. Has non-obstructive occlusino on the right which is being followed.  Plan - continue ASA therapy.  Her updated medication list for this problem includes:    Adult Aspirin Low Strength 81 Mg Tbdp (Aspirin) ..... Once daily  Problem # 3:  PERIPHERAL VASCULAR DISEASE (ICD-443.9) No limitation in activities. No eveidence of claudication. Carotid disease being closely followed by Vascular surgery and Cardiology.  Orders: TLB-CBC Platelet - w/Differential (85025-CBCD)  Problem # 4:  HYPERTENSION (ICD-401.9) Her updated medication list for this problem includes:    Cartia Xt 180 Mg Cp24 (Diltiazem hcl coated beads) .....  Once daily    Losartan Potassium-hctz 100-12.5 Mg Tabs (Losartan potassium-hctz) .Marland Kitchen... 1 by mouth once daily  Orders: TLB-BMP (Basic Metabolic Panel-BMET) (80048-METABOL)  BP today: 160/70 Prior BP: 192/68 (09/06/2010)  BP was more elevated at her lzst visit to Dr. Myrtis Ser. she wasn to have BP monitored and by her rep;ort it is much better than at todays visit.   Plan - continue present regimen.           Have BP checked at [facility] etc and report back.   Suboptimal control on present medication.   Labs Reviewed: K+: 4.6 (03/14/2010) Creat: : 1.0 (03/14/2010)    Problem # 5:  HYPERLIPIDEMIA (ICD-272.4)  Her updated medication list for this problem includes:    Simvastatin 80 Mg Tabs (Simvastatin) .Marland Kitchen... 1 by mouth once daily  Orders: TLB-Lipid Panel (80061-LIPID) TLB-Hepatic/Liver Function Pnl (80076-HEPATIC)  Labs Reviewed: SGOT: 21 (10/26/2009)   SGPT: 13 (10/26/2009)   HDL:87.50 (10/26/2009), 92.4 (08/25/2008)  LDL:89 (10/26/2009), 72 (08/25/2008)  Chol:187 (10/26/2009), 170 (08/25/2008)  Trig:51.0 (10/26/2009), 28 (08/25/2008)  Excellent lipid panel. No change in regimen.  Problem # 6:  DM (ICD-250.00) Her updated medication list for this problem includes:    Losartan Potassium-hctz 100-12.5 Mg Tabs (Losartan potassium-hctz) .Marland Kitchen... 1 by mouth once daily    Metformin Hcl 1000 Mg Tabs (Metformin hcl) .Marland Kitchen... Take 1 tablet by mouth two times a day    Adult Aspirin Low Strength 81 Mg Tbdp (Aspirin) ..... Once daily    Actos 45 Mg Tabs (Pioglitazone hcl) .Marland Kitchen... 1 once daily 30  Orders: TLB-A1C / Hgb A1C (Glycohemoglobin) (83036-A1C)  Addendum - A1C 7.7% - above goal of 7% but below level for mandatory medication change.  Plan continue prsent medications         Increase vigilence in reagerd to no sugar low carb diet.  If there are questions about acutal menues will refer to dietician.  Problem # 7:  Preventive Health Care (ICD-V70.0) Interval hisotyr is benign. Limited  physical exam is normal. Lab results, except for A1C, within normal limits. Last colonosocopy May '09. Last mammogram Sept '11. Immunizations: Tetnus - today; Pneumovax May '98; flu Nov '11.  In summary - a very nice woman who remains acitve and employed at the [employer]. She is medically stable except for a need to monitor her out of office BP. she will return in 6 months for routine follow-up, sooner as needed.   Complete Medication List: 1)  Citracal + D 250-200 Mg-unit Tabs (Calcium citrate-vitamin d) .... Four times a day 2)  Cartia Xt 180 Mg Cp24 (Diltiazem hcl coated beads) .... Once daily 3)  Klor-con M20 20 Meq Tbcr (Potassium chloride crys cr) .... Once daily 4)  Losartan Potassium-hctz 100-12.5 Mg Tabs (Losartan potassium-hctz) .Marland Kitchen.. 1 by mouth once daily 5)  Metformin Hcl 1000 Mg Tabs (Metformin hcl) .... Take 1 tablet by mouth two times a day 6)  Simvastatin 80 Mg Tabs (Simvastatin) .Marland Kitchen.. 1 by mouth once daily 7)  Vitamin D 1000 Unit Tabs (Cholecalciferol) .... Once daily 8)  Adult Aspirin Low Strength 81 Mg Tbdp (Aspirin) .... Once daily 9)  Garlique 400 Mg Tbec (Garlic) .... Once daily 10)  Fish Oil 1000 Mg Caps (Omega-3 fatty acids) .... Once daily 11)  Actos 45 Mg Tabs (Pioglitazone hcl) .Marland Kitchen.. 1 once daily 30  Other Orders: TD Toxoids IM 7 YR + (16109) Admin 1st Vaccine (60454) Medicare -1st Annual Wellness Visit [phone])  Patient: Joanna [name] Note: All result statuses are Final unless otherwise noted.  Tests: (1) Lipid Panel (LIPID)   Cholesterol               190 mg/dL                   9-147     ATP III Classification            Desirable:  < 200 mg/dL                    Borderline High:  200 - 239 mg/dL               High:  > = 240 mg/dL   Triglycerides             88.0 mg/dL                  8.2-956.2     Normal:  <150 mg/dL     Borderline High:  130 - 199 mg/dL   HDL                       86.57 mg/dL                 >84.69   VLDL Cholesterol          17.6  mg/dL                  6.2-95.2   LDL Cholesterol           75 mg/dL                    8-41  CHO/HDL Ratio:  CHD Risk                             2                    Men          Women     1/2 Average Risk     3.4          3.3  Average Risk          5.0          4.4     2X Average Risk          9.6          7.1     3X Average Risk          15.0          11.0                           Tests: (2) Hepatic/Liver Function Panel (HEPATIC)   Total Bilirubin           0.6 mg/dL                   1.9-1.4   Direct Bilirubin          0.1 mg/dL                   7.8-2.9   Alkaline Phosphatase      44 U/L                      39-117   AST                       21 U/L                      0-37   ALT                       14 U/L                      0-35   Total Protein             7.0 g/dL                    5.6-2.1   Albumin                   4.2 g/dL                    3.0-8.6  Tests: (3) BMP (METABOL)   Sodium                    139 mEq/L                   135-145   Potassium                 4.8 mEq/L                   3.5-5.1   Chloride                  102 mEq/L                   96-112   Carbon Dioxide            30 mEq/L                    19-32   Glucose              [H]  109 mg/dL                   57-84   BUN                  [  H]  29 mg/dL                    1-61   Creatinine                1.0 mg/dL                   0.9-6.0   Calcium                   10.5 mg/dL                  4.5-40.9   GFR                  [L]  58.98 mL/min                >60.00  Tests: (4) Hemoglobin A1C (A1C)   Hemoglobin A1C       [H]  7.5 %                       4.6-6.5     Glycemic Control Guidelines for People with Diabetes:     Non Diabetic:  <6%     Goal of Therapy: <7%     Additional Action Suggested:  >8%   Tests: (5) CBC Platelet w/Diff (CBCD)   White Cell Count          9.8 K/uL                    4.5-10.5   Red Cell Count       [L]  3.01 Mil/uL                 3.87-5.11   Hemoglobin            [L]  8.0 g/dL                    81.1-91.4     Rechecked and verified result.   Hematocrit           [L]  24.1 %                      36.0-46.0   MCV                       80.3 fl                     78.0-100.0   MCHC                      33.3 g/dL                   78.2-95.6   RDW                  [H]  16.2 %                      11.5-14.6   Platelet Count            272.0 K/uL                  150.0-400.0   Neutrophil %              69.0 %                      43.0-77.0  Lymphocyte %              21.8 %                      12.0-46.0   Monocyte %                6.7 %                       3.0-12.0   Eosinophils%              2.3 %                       0.0-5.0   Basophils %               0.2 %                       0.0-3.0   Neutrophill Absolute      6.8 K/uL                    1.4-7.7   Lymphocyte Absolute       2.1 K/uL                    0.7-4.0   Monocyte Absolute         0.7 K/uL                    0.1-1.0  Eosinophils, Absolute                             0.2 K/uL                    0.0-0.7   Basophils Absolute        0.0 K/uL                    0.0-0.1Prescriptions: ACTOS 45 MG  TABS (PIOGLITAZONE HCL) 1 once daily 30  #90 x 3   Entered and Authorized by:   Jacques Navy MD   Signed by:   Jacques Navy MD on 11/15/2010   Method used:   Faxed to ...       [employer] Environmental education officer)       [address] [address]       [place], [place]  [address]       Ph: [phone]       Fax: [phone]   RxID:   [id number] SIMVASTATIN 80 MG TABS (SIMVASTATIN) 1 by mouth once daily  #90 x 3   Entered and Authorized by:   Jacques Navy MD   Signed by:   Jacques Navy MD on 11/15/2010   Method used:   Faxed to ...       [facility] Environmental education officer)       [address] [address]       [place], [place]  [address]       Ph: [phone]       Fax: [phone]   RxID:   [id number] METFORMIN HCL 1000 MG  TABS (METFORMIN HCL) Take 1 tablet by mouth two times a day  #180 x 3   Entered and  Authorized by:   Jacques Navy MD   Signed by:   Jacques Navy MD on 11/15/2010   Method used:   Faxed to .Marland KitchenMarland Kitchen  [employer] Environmental education officer)       [address] [address]       [place], [place]  [address]       Ph: [phone]       Fax: [phone]   RxID:   [id number] LOSARTAN POTASSIUM-HCTZ 100-12.5 MG TABS (LOSARTAN POTASSIUM-HCTZ) 1 by mouth once daily  #90 x 3   Entered and Authorized by:   Jacques Navy MD   Signed by:   Jacques Navy MD on 11/15/2010   Method used:   Faxed to ...       [facility] Environmental education officer)       [address] [address]       [place], [place]  [address]       Ph: [phone]       Fax: [phone]   RxID:   [id number] KLOR-CON M20 20 MEQ  TBCR (POTASSIUM CHLORIDE CRYS CR) once daily  #90 x 3   Entered and Authorized by:   Jacques Navy MD   Signed by:   Jacques Navy MD on 11/15/2010   Method used:   Faxed to ...       [facility] Environmental education officer)       [address] [address]       [place], [place]  [address]       Ph: [phone]       Fax: [phone]   RxID:   [id number] CARTIA XT 180 MG  CP24 (DILTIAZEM HCL COATED BEADS) once daily  #90 x 3   Entered and Authorized by:   Jacques Navy MD   Signed by:   Jacques Navy MD on 11/15/2010   Method used:   Faxed to ...       [facility] Environmental education officer)       [address] [address]       [place], [place]  [address]       Ph: [phone]       Fax: [phone]   RxID:   [id number]    Orders Added: 1)  TD Toxoids IM 7 YR + [90714] 2)  Admin 1st Vaccine [90471] 3)  Medicare -1st Annual Wellness Visit [G0438] 4)  Est. Patient Level III [24235] 5)  TLB-Lipid Panel [80061-LIPID] 6)  TLB-Hepatic/Liver Function Pnl [80076-HEPATIC] 7)  TLB-BMP (Basic Metabolic Panel-BMET) [80048-METABOL] 8)  TLB-A1C / Hgb A1C (Glycohemoglobin) [83036-A1C] 9)  TLB-CBC Platelet - w/Differential [85025-CBCD]   Immunization History:  Influenza Immunization History:    Influenza:  historical (08/30/2010)  Immunizations  Administered:  Tetanus Vaccine:    Vaccine Type: Td    Site: left deltoid    Mfr: Sanofi Pasteur    Dose: 0.5 ml    Route: IM    Given by: Lamar Sprinkles, CMA    Exp. Date: 02/06/2012    Lot #: [id number]    VIS given: 09/06/08 version given November 15, 2010.   Immunization History:  Influenza Immunization History:    Influenza:  Historical (08/30/2010)  Immunizations Administered:  Tetanus Vaccine:    Vaccine Type: Td    Site: left deltoid    Mfr: Sanofi Pasteur    Dose: 0.5 ml    Route: IM    Given by: Lamar Sprinkles, CMA    Exp. Date: 02/06/2012    Lot #: [id number]    VIS given: 09/06/08 version given November 15, 2010.

## 2011-01-03 ENCOUNTER — Other Ambulatory Visit: Payer: Self-pay | Admitting: Gynecology

## 2011-01-06 LAB — BASIC METABOLIC PANEL
BUN: 13 mg/dL (ref 6–23)
Chloride: 101 mEq/L (ref 96–112)
GFR calc non Af Amer: >60 mL/min (ref >60)
Glucose, Bld: 195 mg/dL — ABNORMAL HIGH (ref 70–99)
Potassium: 3.3 mEq/L — ABNORMAL LOW (ref 3.5–5.1)
Sodium: 133 mEq/L — ABNORMAL LOW (ref 135–145)

## 2011-01-06 LAB — COMPREHENSIVE METABOLIC PANEL
ALT: 14 U/L (ref 0–35)
AST: 20 U/L (ref 0–37)
Albumin: 4.1 g/dL (ref 3.5–5.2)
CO2: 29 mEq/L (ref 19–32)
Chloride: 103 mEq/L (ref 96–112)
GFR calc Af Amer: >60 mL/min (ref >60)
GFR calc non Af Amer: >60 mL/min (ref >60)
Potassium: 4.1 mEq/L (ref 3.5–5.1)
Sodium: 139 mEq/L (ref 135–145)
Total Bilirubin: 0.6 mg/dL (ref 0.3–1.2)

## 2011-01-06 LAB — GLUCOSE, CAPILLARY: Glucose-Capillary: 244 mg/dL — ABNORMAL HIGH (ref 70–99)

## 2011-01-06 LAB — CBC
HCT: 21.8 % — ABNORMAL LOW (ref 36.0–46.0)
Hemoglobin: 7.3 g/dL — ABNORMAL LOW (ref 12.0–15.0)
MCV: 81.9 fL (ref 78.0–100.0)
Platelets: 176 10*3/uL (ref 150–400)
WBC: 5.4 10*3/uL (ref 4.0–10.5)
WBC: 8.5 10*3/uL (ref 4.0–10.5)

## 2011-01-06 LAB — HEMOGLOBIN A1C: Hgb A1c MFr Bld: 7 % — ABNORMAL HIGH (ref <5.7)

## 2011-01-06 LAB — MRSA PCR SCREENING: MRSA by PCR: NEGATIVE

## 2011-01-06 LAB — URINALYSIS, ROUTINE W REFLEX MICROSCOPIC
Hgb urine dipstick: NEGATIVE
Specific Gravity, Urine: 1.015 (ref 1.005–1.030)
pH: 7 (ref 5.0–8.0)

## 2011-01-06 LAB — TYPE AND SCREEN: Antibody Screen: NEGATIVE

## 2011-01-06 LAB — ABO/RH: ABO/RH(D): A POS

## 2011-01-06 LAB — APTT: aPTT: 27 seconds (ref 24–37)

## 2011-03-04 NOTE — Assessment & Plan Note (Signed)
OFFICE VISIT   GARTON, Lekeya H  DOB:  19-Jun-1930                                       10/23/2010  ZOXWR#:[id number]   I saw the patient in the office today for followup after her previous  left carotid endarterectomy.  This is a pleasant 77 year old woman who  developed a greater than 80% left carotid stenosis.  She had a string  sign on the left and underwent left carotid endarterectomy with Dacron  patch angioplasty on 03/22/2010.  She returns for a 47-month followup  visit.  Since I saw her last, she has had no history of stroke, TIAs,  expressive or receptive aphasia or amaurosis fugax.  She continues to  work.   REVIEW OF SYSTEMS:  Cardiovascular:  no chest pain, chest pressure,  palpitations or arrhythmias.  Pulmonary: No productive cough, bronchitis, asthma or wheezing.   PHYSICAL EXAMINATION:  This is a pleasant 77 year old woman who appears  her stated age.  Blood pressure is 156/54, heart rate 98, respiratory  rate is 12.  Lungs:  Clear bilaterally to auscultation.  Cardiovascular:  She has bilateral carotid bruits.  She has a systolic murmur.  Abdomen  is soft and non-tender with normal pitched bowel sounds. Neurologic:  Nonfocal.   I did review her carotid duplex scan which was done at the [facility]  office, and this shows a widely patent left carotid endarterectomy site  with a 40% to 59% right carotid stenosis.   She is scheduled to have yearly carotid studies at the [facility] office,  and I will see her back p.r.n.  if she develops any progression of her  stenosis on the right, I  would be happy to see her back at any time.  In the meantime, she knows to continue taking her aspirin.     Di Kindle. Edilia Bo, M.D.  Electronically Signed   CSD/MEDQ  D:  10/23/2010  T:  10/24/2010  Job:  [id number]   cc:   Luis Abed, MD, [facility]  Rosalyn Gess. Norins, MD

## 2011-03-04 NOTE — Assessment & Plan Note (Signed)
OFFICE VISIT   ZELL, Teniola H  DOB:  1929-11-26                                       04/11/2010  EAVWU#:[id number]   I saw the patient in the office today for follow-up after her recent  left carotid endarterectomy.  This is a 77 year old woman with known  carotid disease.  This progressed to greater than 80% on the left.  CT  angio had suggested a string sign on the left side.  She underwent left  carotid endarterectomy with Dacron patch angioplasty on 03/2010 in order  to lower her risk of future stroke.  She did well postoperatively and  was discharged on postoperative day #1.  She returns for her first  follow-up visit.  Overall she has been doing quite well has no specific  complaints.  She had been gradually resuming her normal activities.   PHYSICAL EXAMINATION:  Blood pressure is 180/53, heart rate is 81,  temperature 97.9.  Lungs:  Clear bilaterally to auscultation.  Cardiac  exam:  She has a regular rate and rhythm.  Neurologic exam:  Nonfocal.  Incision is healing nicely.   Overall I am pleased with her progress.  I will plan on seeing her back  in 6 months with a follow-up duplex scan.  She knows to call sooner if  she has problems.  In the meantime she knows to continue taking her  aspirin.     Di Kindle. Edilia Bo, M.D.  Electronically Signed   CSD/MEDQ  D:  04/11/2010  T:  04/12/2010  Job:  [id number]   cc:   Luis Abed, MD, [facility]  Rosalyn Gess. Norins, MD

## 2011-03-04 NOTE — Assessment & Plan Note (Signed)
[facility]                            CARDIOLOGY OFFICE NOTE   NAME:Joanna [name], Joanna [name]                       MRN:          [id number]  DATE:06/16/2008                            DOB:          [birth year]    Ms. Dickard is seen for followup.  She is actually doing well.  She has  not had any chest pain.  There has been no shortness of breath, syncope  or presyncope.  She has a history of a left bundle branch block.  She  has significant carotid disease that has been stable.  There was a  question of an abnormal Myoview scan in the past, but her  catheterization revealed no significant disease.  Her last Doppler had  been done in February 2008.  There was plan to have another one done in  followup, but this has not yet been done and we will arrange it as of  today.   PAST MEDICAL HISTORY:   ALLERGIES:  STEROIDS.   MEDICATIONS:  1. Cartia XT 180.  2. Potassium.  3. Diovan.  4. Hydrochlorothiazide.  5. Metformin.  6. Vytorin 10/40.  7. Vitamin D.  8. Aspirin.  9. Fish oil.   OTHER MEDICAL PROBLEMS:  See the list below.   REVIEW OF SYSTEMS:  Her review of systems is negative.  She feels well  and she is not having any significant problems.   PHYSICAL EXAMINATION:  VITAL SIGNS:  Weight is 162 pounds which is  stable for her.  Blood pressure 138/58 with a pulse of 90.  GENERAL:  The patient is oriented to person, time and place.  Affect is  normal.  HEENT:  No xanthelasma.  She has normal extraocular motion.  NECK:  The patient has carotid bruit.  This is on the left.  There is no  jugular venous distention.  LUNGS:  Clear.  Respiratory effort is not labored.  CARDIAC:  An S1 with an S2.  There is a soft systolic murmur.  ABDOMEN:  Soft.  EXTREMITIES:  She does have 1+ peripheral edema.   EKG shows old left bundle branch block.   PROBLEMS:  1. History of hypertension.  Blood pressure is stable today.  2. Hyperlipidemia on medication.  3.  Diabetes, treated.  4. Peripheral vascular disease.  She does have significant stenosis of      the left internal carotid.  She needs followup Dopplers done now.  5. Old left bundle branch block.  6. Systolic murmur with aortic valve sclerosis.  Followup echo was not      needed.  7. Normal LV function.  8. History of an abnormal Myoview and his cath in the past.  This may      be some distal disease but no major proximal coronary disease.   She is doing well.  She needs carotid Dopplers in the near future.  Followup in 1 year.  peripheral edema.  This is from venous  insufficiency.  It disappears completely at night.  Dr. Debby Bud has  already instructed her to wear support hose, which she says she is  willing to do when she has pants on.  She is wearing shorts today.     Luis Abed, MD, [facility]  Electronically Signed    JDK/MedQ  DD: 06/16/2008  DT: 06/17/2008  Job #: [id number]   cc:   Rosalyn Gess. Norins, MD

## 2011-03-07 NOTE — Assessment & Plan Note (Signed)
[facility]                            CARDIOLOGY OFFICE NOTE   NAME:[name], [name]                       MRN:          [id number]  DATE:01/12/2007                            DOB:          1930/07/20    [name] feels well today. See my complete note of December 10, 2006.  She has a left bundle branch block that is new over the past year. We  assessed her completely. She does have vascular disease affecting her  carotids. We decided to proceed with a 2D echo and a Myoview scan. The  echo reveals good LV function. Ejection fraction 65%. There is a mild  aortic valve sclerosis and mild pulmonary hypertension. There was mild  mitral annular calcification. Her adenosine Myoview scan is not normal.  The study reveals decreased activity in a small area at the apical  aspect of the inferior wall. There is some reversibility in this area  suggesting the possibility that there is ischemia here. There is some  dyssynergy of the septum. It could be related to bundle branch block.  Overall this is a mildly abnormal study. Today I have seen the patient  back, and she is feeling well. She has not had chest pain, but there has  been no syncope or presyncope.   I had a very careful and complete discussion with the patient and her  daughter describing the results. See the further discussion below.   ALLERGIES:  Question of a steroid allergy.   MEDICATIONS:  1. Calcium.  2. Cardia XT 180.  3. Potassium.  4. Diovan.  5. Hydrochlorothiazide.  6. Metformin.  7. Vytorin.  8. Vitamin D.  9. Aspirin.  10.Fish oil.   OTHER MEDICAL PROBLEMS:  See the list below.   REVIEW OF SYSTEMS:  She feels well, and her Review of Systems is  negative.   PHYSICAL EXAMINATION:  Physical exam today is limited.  VITAL SIGNS:  Blood pressure 170/73 today which is increased. Her  pressures while in the nuclear lab were not significantly elevated.  However, there are times she  might need further adjustment to her blood  pressure meds.  GENERAL:  The patient is oriented to person, time, and place. Affect is  normal.   PROBLEMS:  1. Hypertension. There may be a white coat component. She needs      outpatient blood pressure followup.  2. Hyperlipidemia. She is on medication.  3. Diabetes being treated.  4. Peripheral vascular disease. She does have 0-30% stenosis on the      right and 60-79% stenosis in the left carotid. This will need      followup in six months.  5. Left bundle branch block.  6. Systolic murmur. There is aortic valve sclerosis.  7. Normal LV function.  8. Abnormal Myoview scan with the probability of some distal inferior      ischemia.   I have discussed these findings at length with the patient and her  daughter. Considering the fact that she has other vascular disease  already proven and that she has diabetes and that she  has a new left  bundle branch block, I feel that proceeding with diagnostic  catheterization is appropriate. I have explained this at length. The  patient and her daughter are trying to decide. I have considered other  medication changes, but have chosen not to add them at this time. She  will make her final decision about cath.     Luis Abed, MD, [facility]  Electronically Signed    JDK/MedQ  DD: 01/12/2007  DT: 01/12/2007  Job #: [id number]   cc:   Rosalyn Gess. Norins, MD  Gretta Cool, M.D.

## 2011-03-07 NOTE — Assessment & Plan Note (Signed)
[facility]                            CARDIOLOGY OFFICE NOTE   NAME:Joanna [name], Joanna [name]                       MRN:          [id number]  DATE:12/10/2006                            DOB:          1929/12/06    Joanna [name] is a delightful 77 year old female who is here for cardiac  evaluation. She is here today with her daughter.   The patient has had the diagnosis made of a new left bundle branch  block. She is not having any chest pain. She is fully active. She is not  having any significant shortness of breath. Dr. Debby Bud also heard a left  carotid bruit and carotid Doppler study was arranged. This was done on  December 08, 2006 and I now see that the patient does have 60% to 79%  stenosis of her left internal carotid artery. She has not had any  presyncope or syncope.   PAST MEDICAL HISTORY:   ALLERGIES:  QUESTION OF STEROID ALLERGY.   MEDICATIONS:  1. Calcium.  2. Cartia XT 180.  3. Potassium.  4. Diovan hydrochlorothiazide.  5. Metformin.  6. Vytorin.  7. Vitamin D.  8. Aspirin.  9. Fish oil.   OTHER MEDICAL PROBLEMS:  See the list below.   SOCIAL HISTORY:  Patient lives here in town. 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 She is divorced. She does not smoke.   FAMILY HISTORY:  Noncontributory at this time.   REVIEW OF SYSTEMS:  Patient has some mild arthritis. Otherwise her  review of systems is negative. She walks actively everyday.   PHYSICAL EXAMINATION:  Blood pressure today is 160/78 with a pulse of  88. Her weight is 165 pounds.  Patient is oriented to person, time, and place. Affect is normal.  She has no xanthelasma. She has normal extraocular motion.  There is no jugular venous distension. There is a soft left carotid  bruit.  LUNGS: Clear. Respiratory effort is not labored.  CARDIAC EXAM: Reveals an S1 with an S2. She does have a 2/6  crescendo/decrescendo murmur at the left sternal border.  ABDOMEN:  Soft. There are no masses or bruits.  She has no peripheral edema. Her feet are warm. She has a 2+ posterior  tib pulse on the right and decreased pulses in her left foot.  She has no major musculoskeletal deformities.   EKG done today reverifies that she remains in left bundle branch block.  As mentioned above, her carotid Doppler was done on December 08, 2006.  She has moderate heterogenous plaque. There is 0% to 39% stenosis on the  right and 60% to 79% stenosis in the left carotid. This will need follow  up in 6 months.   PROBLEMS:  Include:  1. Hypertension. There is question of a white coat component. She      needs very careful attention to making sure that her blood pressure      is controlled.  2. Hyperlipidemia.  Keeping her LDL as low as possible is clearly      appropriate.  3. Diabetes.  This  is being treated and followed very carefully by Dr.      Debby Bud.  4. Peripheral vascular disease. This is now documented with her      Doppler.  5. Left bundle branch block. This is news over the years. At this      point, it appears she has no cardiac symptoms. She does need an      adenosine Myoview to be sure that she does not have silent      ischemia.  6. Systolic murmur. She needs a 2D echo to assess the murmur and to      carefully assess left ventricular function, keeping in mind that      she will have a septal abnormality related to her left bundle      branch block.   Patient is stable. I have tried to reassure her. Her daughter is here to  help her. We will obtain these tests and I will see her back in follow  up.     Luis Abed, MD, [facility]  Electronically Signed    JDK/MedQ  DD: 12/10/2006  DT: 12/10/2006  Job #: [id number]   cc:   Rosalyn Gess. Norins, MD  Gretta Cool, M.D.

## 2011-03-07 NOTE — Assessment & Plan Note (Signed)
[facility]                            CARDIOLOGY OFFICE NOTE   NAME:[name], [name]                       MRN:          [id number]  DATE:02/08/2007                            DOB:          [birth year]    Joanna [name] is seen for followup.  I had seen her last on January 13, 2007.  We decided that based on multiple factors we needed to proceed with a  diagnostic catheterization.  This was done in the outpatient lab by Dr.  Antoine Poche on January 14, 2007.  She had only minimal coronary plaque.  She  had no significant obstructing coronary lesions.  Today she is back for  followup and she is doing well.   I have had a nice discussion with her and her daughter.  She is not  having any significant symptoms.  Her cath site is nicely healed.   PAST MEDICAL HISTORY:  ALLERGIES:  QUESTION OF A STEROID ALLERGY.   MEDICATIONS:  Citracal, Cartia XT, potassium, Diovan,  hydrochlorothiazide, metformin, Vytorin, vitamin D, aspirin, fish oil.   OTHER MEDICAL PROBLEMS:  See the list on my note of January 13, 2007.   REVIEW OF SYSTEMS:  She is feeling fine and review of systems is  negative.   PHYSICAL EXAMINATION:  Blood pressure today is 143/63 which is very good  for her in the office.  She reports as an outpatient her blood pressure  is under control.  Pulse is 76.  The patient is oriented to person,  time, and place and her affect is normal.  LUNGS:  Clear.  Respiratory effort is not labored.  HEENT:  Reveals no xanthelasma.  She has normal extraocular motion.  She  does have a soft left carotid bruit.  There is no jugular venous  distention.  CARDIAC:  Reveals an S1 with an S2.  ABDOMEN:  Soft.  She has no peripheral edema.   No labs are done today.  See the results of her cath report as outlined  above.   PROBLEMS:  Are number 1 through 8 on my visit of January 13, 2007.  1. Hypertension.  Her blood pressure is controlled at this time.  2. Peripheral vascular  disease.  She will have followup carotid study      in approximately 6 months.  3. Left bundle branch block of no significance.  4. Abnormal Myoview and the question of some distal inferior ischemia.      Catheterization revealed no significant disease.  No further      cardiac workup is needed.  She does need      careful followup of her blood pressure and her peripheral vascular      disease.  I can see her back in 12 months for Cardiology followup.     Luis Abed, MD, [facility]  Electronically Signed    JDK/MedQ  DD: 02/08/2007  DT: 02/08/2007  Job #: [id number]   cc:   Rosalyn Gess. Norins, MD  Gretta Cool, M.D.

## 2011-03-07 NOTE — Cardiovascular Report (Signed)
[name], Joanna [name]                ACCOUNT NO.:  [id number]   MEDICAL RECORD NO.:  [id number]          PATIENT TYPE:  OIB   LOCATION:  NA                           FACILITY:  [facility]   PHYSICIAN:  Rollene Rotunda, MD, FACCDATE OF BIRTH:  1930/10/20   DATE OF PROCEDURE:  01/14/2007  DATE OF DISCHARGE:                            CARDIAC CATHETERIZATION   PRIMARY:  Rosalyn Gess. Norins, MD   CARDIOLOGIST:  Luis Abed, MD, [facility]   PROCEDURE:  Left heart catheterization.  Selective coronary  arteriography.   INDICATIONS:  The patient with an abnormal EKG and abnormal Cardiolite.   PROCEDURE NOTE:  Left heart catheterization performed via right femoral  artery.  Artery was cannulated using the anterior wall puncture.  A #4-  [place] arterial sheath was inserted via the modified Seldinger  technique.  Preformed Judkins and pigtail catheter were utilized.  The  patient tolerated procedure well and left lab in stable condition.   RESULTS:   HEMODYNAMIC RESULTS:  LV 137/4, AO 139/96.  Coronaries: left main was  normal.  The LAD was normal with a mid diagonal that was moderate-sized  and normal.  The circumflex in the AV groove was normal.  There is a  large ramus intermediate which was normal.  Mid obtuse marginal was  large and normal.  The right coronary artery is dominant vessel.  There  was long proximal luminal irregularities.  There was mid long 25%  stenosis.  The PDA was moderate size normal.  Posterolateral was  moderate size normal.   LEFT VENTRICULOGRAM:  Left ventriculogram was obtained in the RAO  projection.  The EF was 65% and normal.   CONCLUSION:  Mild coronary plaque.  Normal left ventricular function.   PLAN:  No further cardiac workup is suggested.  The patient will follow  with Dr. Debby Bud.      Rollene Rotunda, MD, [facility]  Electronically Signed     JH/MEDQ  D:  01/14/2007  T:  01/14/2007  Job:  [id number]   cc:   Rosalyn Gess. Norins, MD  Luis Abed, MD,  [facility]

## 2011-03-07 NOTE — Assessment & Plan Note (Signed)
[facility]                           PRIMARY CARE OFFICE NOTE   NAME:Joanna [name], Joanna [name]                       MRN:          [id number]  DATE:12/04/2006                            DOB:          1930-04-23    [name] is a delightful 77 year old woman who presents for  followup evaluation and exam.  She reports she recently has been seen by  Dr. Nicholas [name] for her annual examination, and Dr. Nicholas [name] did note that she  had developed a carotid bruit, greater on the left than the right.   Patient reports that she generally has been doing well.  She continues  to follow her blood pressures at home, and reports they have been in the  120 to 130 range over 80s when checked.  She does have a history of  white coat hypertension.   PAST MEDICAL HISTORY:  Well documented in my note of October 31, 2005  without significant change.   CURRENT MEDICATIONS:  1. Centrum Silver daily.  2. Fish oil daily.  3. Garlic 1200 mg daily.  4. Coral calcium daily.  5. Potassium 20 mEq daily.  6. Metformin mg 500 mg b.i.d.  7. Vytorin 10/40 nightly.  8. Diovan 320/25 once daily.  9. Cartia XT 180 mg daily.  10.Vitamin D daily.   HEALTH MAINTENANCE:  Patient's last mammogram was August 2007, where she  had diagnostic views done, which showed mammographic evidence of  malignancy.  Last pelvic and Pap smear by Dr. Nicholas [name] January 2008.  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  Patient had pneumonia vaccine in  1998.   REVIEW OF SYSTEMS:  Negative for any constitutional, cardiovascular,  respiratory, GI or GU complaints.   EXAMINATION:  Temperature was 97.9.  Blood pressure 182/75.  Pulse 87.  Weight 167.  GENERAL APPEARANCE:  This is a heavyset Caucasian woman who looks her  stated age, in no acute distress.  HEENT EXAM:  Normocephalic and atraumatic.  EACs and TMs were  unremarkable.   Oropharynx with native dentition in good repair.  No  buccal or palate lesions were noted.  Posterior pharynx was clear.  Conjunctivae and sclerae were clear.  Pupils equal, round and reactive.  Funduscopic exam deferred to Ophthalmology.  NECK:  Supple without thyromegaly.  NODES:  No adenopathy was noted in the cervical or supraclavicular  regions.  CHEST:  No CVA tenderness.  No deformity was noted.  LUNGS:  Clear with no rales, wheezes or rhonchi.  CARDIOVASCULAR:  Two plus radial pulse.  She had no JVD.  She had  carotid bruits bilaterally, left greater than right.  Precordium was  quiet.  She had a regular rate and rhythm without murmurs, rubs or  gallops.  BREAST EXAM:  Deferred to Gynecology.  ABDOMEN:  Soft.  No guarding or rebound.  No organosplenomegaly.  PELVIC AND RECTAL EXAM:  Deferred to Gynecology and GI.  EXTREMITIES:  Without clubbing, cyanosis, edema or deformities.  NEUROLOGIC:  Patient is awake and alert.  She is oriented to  person,  place, time and context, and her exam is nonfocal.  SKIN:  Clear with no suspicious lesions noted.   DATABASE:  Hemoglobin was 11.8 gm.  White count was 8.1 with a normal  differential.  Chemistries revealed a sodium low at 134.  Potassium  normal at 4.6.  Glucose was 158.  Creatinine was 0.8.  Liver functions  were normal.  GFR was normal at 75.  Cholesterol 171, triglycerides 88,  HDL 85.8, LDL 68.  Thyroid function normal with a TSH of 1.32.  Urinalysis was negative.   Twelve-lead electrocardiogram revealed the patient to have developed a  left bundle branch block since her last EKG performed November 2003.   ASSESSMENT AND PLAN:  1. Hypertension.  Patient does have white coat hypertension by her      report.  Clearly, her blood pressure is elevated at today's visit      despite being on 3-medication regimen.  Plan:  We will continue her      present medications.  The patient is asked to take her blood      pressure  periodically at home and keep a record.  2. Lipids.  Patient has excellent control of her hyperlipidemia with      an LDL definitely at goal at 68.  Plan:  Patient to continue her      present medications.  3. Diabetes:  Patient did not have a hemoglobin A1c.  In reviewing      laboratory from last May, patient had a serum glucose of 149 with a      corresponding A1c of 6.8%.  We will make the assumption that the      patient's hemoglobin A1c will continue to be in the 6.8% to 7%      range.  Plan:  Patient to continue with metformin and to follow a      sugar free, low-carb diet.  4. Peripheral vascular disease.  Patient does have carotid bruits      noted.  She is scheduled to have carotid Doppler study, which has      been scheduled for her at [facility] on Tuesday, December 08, 2006 at 11 a.m.  Patient is aware of this appointment and      location of the study.  5. Cardiovascular.  Patient has developed new left bundle branch      block.  She has had no cardiac complaints.  Her risk factors do      include hyperlipidemia and diabetes.  Question whether this is      ischemic in origin.  Plan:  Patient will be referred to Cardiology      for consultation and evaluation.  6. Health maintenance.  Patient is current with her gynecologist.      Current with breast screening.  Current for colon cancer screening.      Current with her pneumonia vaccine.   In summary, this is a delightful woman who is scheduled for carotid  Dopplers noted, who will be referred to Cardiology for evaluation.     Joanna Gess Norins, MD  Electronically Signed    MEN/MedQ  DD: 12/05/2006  DT: 12/05/2006  Job #: [id number]   cc:   Joanna [name]

## 2011-07-09 ENCOUNTER — Other Ambulatory Visit: Payer: Self-pay | Admitting: Internal Medicine

## 2011-07-09 DIAGNOSIS — Z1231 Encounter for screening mammogram for malignant neoplasm of breast: Secondary | ICD-10-CM

## 2011-07-18 ENCOUNTER — Ambulatory Visit
Admission: RE | Admit: 2011-07-18 | Discharge: 2011-07-18 | Disposition: A | Discharge status: Home / Self Care | Payer: Medicare Other | Source: Ambulatory Visit | Attending: Internal Medicine | Admitting: Internal Medicine

## 2011-07-18 DIAGNOSIS — Z1231 Encounter for screening mammogram for malignant neoplasm of breast: Secondary | ICD-10-CM

## 2011-09-16 ENCOUNTER — Inpatient Hospital Stay (HOSPITAL_COMMUNITY)
Admission: EM | Admit: 2011-09-16 | Discharge: 2011-09-21 | DRG: 287 | Disposition: A | Discharge status: Home / Self Care | Payer: Medicare Other | Attending: Cardiology | Admitting: Cardiology

## 2011-09-16 ENCOUNTER — Encounter: Payer: Self-pay | Admitting: Emergency Medicine

## 2011-09-16 ENCOUNTER — Emergency Department (HOSPITAL_COMMUNITY): Payer: Medicare Other

## 2011-09-16 DIAGNOSIS — E119 Type 2 diabetes mellitus without complications: Secondary | ICD-10-CM | POA: Diagnosis present

## 2011-09-16 DIAGNOSIS — R609 Edema, unspecified: Secondary | ICD-10-CM

## 2011-09-16 DIAGNOSIS — I739 Peripheral vascular disease, unspecified: Secondary | ICD-10-CM | POA: Diagnosis present

## 2011-09-16 DIAGNOSIS — I350 Nonrheumatic aortic (valve) stenosis: Secondary | ICD-10-CM

## 2011-09-16 DIAGNOSIS — Z79899 Other long term (current) drug therapy: Secondary | ICD-10-CM

## 2011-09-16 DIAGNOSIS — I509 Heart failure, unspecified: Secondary | ICD-10-CM

## 2011-09-16 DIAGNOSIS — I359 Nonrheumatic aortic valve disorder, unspecified: Secondary | ICD-10-CM

## 2011-09-16 DIAGNOSIS — I2789 Other specified pulmonary heart diseases: Secondary | ICD-10-CM | POA: Diagnosis present

## 2011-09-16 DIAGNOSIS — E785 Hyperlipidemia, unspecified: Secondary | ICD-10-CM | POA: Diagnosis present

## 2011-09-16 DIAGNOSIS — I10 Essential (primary) hypertension: Secondary | ICD-10-CM | POA: Diagnosis present

## 2011-09-16 DIAGNOSIS — R0602 Shortness of breath: Secondary | ICD-10-CM

## 2011-09-16 DIAGNOSIS — I5023 Acute on chronic systolic (congestive) heart failure: Principal | ICD-10-CM | POA: Diagnosis present

## 2011-09-16 DIAGNOSIS — Z7982 Long term (current) use of aspirin: Secondary | ICD-10-CM

## 2011-09-16 DIAGNOSIS — Z8673 Personal history of transient ischemic attack (TIA), and cerebral infarction without residual deficits: Secondary | ICD-10-CM

## 2011-09-16 DIAGNOSIS — D509 Iron deficiency anemia, unspecified: Secondary | ICD-10-CM | POA: Diagnosis present

## 2011-09-16 DIAGNOSIS — E1129 Type 2 diabetes mellitus with other diabetic kidney complication: Secondary | ICD-10-CM | POA: Insufficient documentation

## 2011-09-16 DIAGNOSIS — E876 Hypokalemia: Secondary | ICD-10-CM | POA: Diagnosis not present

## 2011-09-16 HISTORY — DX: Hyperlipidemia, unspecified: E78.5

## 2011-09-16 HISTORY — DX: Peripheral vascular disease, unspecified: I73.9

## 2011-09-16 HISTORY — DX: Unspecified osteoarthritis, unspecified site: M19.90

## 2011-09-16 HISTORY — DX: Essential (primary) hypertension: I10

## 2011-09-16 LAB — RETICULOCYTES: RBC.: 2.78 MIL/uL — ABNORMAL LOW (ref 3.87–5.11)

## 2011-09-16 LAB — URINALYSIS, ROUTINE W REFLEX MICROSCOPIC
Bilirubin Urine: NEGATIVE
Glucose, UA: NEGATIVE mg/dL
Protein, ur: NEGATIVE mg/dL
Specific Gravity, Urine: 1.007 (ref 1.005–1.030)

## 2011-09-16 LAB — IRON AND TIBC
Iron: 17 ug/dL — ABNORMAL LOW (ref 42–135)
Saturation Ratios: 4 % — ABNORMAL LOW (ref 20–55)

## 2011-09-16 LAB — HEPATIC FUNCTION PANEL
ALT: 22 U/L (ref 0–35)
AST: 27 U/L (ref 0–37)
Albumin: 3.4 g/dL — ABNORMAL LOW (ref 3.5–5.2)
Total Bilirubin: 0.3 mg/dL (ref 0.3–1.2)

## 2011-09-16 LAB — BASIC METABOLIC PANEL
BUN: 25 mg/dL — ABNORMAL HIGH (ref 6–23)
CO2: 27 mEq/L (ref 19–32)
Calcium: 10.9 mg/dL — ABNORMAL HIGH (ref 8.4–10.5)
Chloride: 99 mEq/L (ref 96–112)
Creatinine, Ser: 0.82 mg/dL (ref 0.50–1.10)
Glucose, Bld: 152 mg/dL — ABNORMAL HIGH (ref 70–99)

## 2011-09-16 LAB — CARDIAC PANEL(CRET KIN+CKTOT+MB+TROPI)
CK, MB: 6.9 ng/mL (ref 0.3–4.0)
CK, MB: 6.6 ng/mL (ref 0.3–4.0)
Relative Index: 4 — ABNORMAL HIGH (ref 0.0–2.5)
Total CK: 172 U/L (ref 7–177)
Total CK: 154 U/L (ref 7–177)
Troponin I: <0.3 ng/mL (ref <0.30)
Troponin I: <0.3 ng/mL (ref <0.30)

## 2011-09-16 LAB — GLUCOSE, CAPILLARY

## 2011-09-16 LAB — FOLATE: Folate: >20 ng/mL

## 2011-09-16 LAB — VITAMIN B12: Vitamin B-12: 320 pg/mL (ref 211–911)

## 2011-09-16 LAB — PROTIME-INR: Prothrombin Time: 14.6 seconds (ref 11.6–15.2)

## 2011-09-16 LAB — CBC
HCT: 23.5 % — ABNORMAL LOW (ref 36.0–46.0)
MCH: 24.1 pg — ABNORMAL LOW (ref 26.0–34.0)
MCV: 79.9 fL (ref 78.0–100.0)
RBC: 2.94 MIL/uL — ABNORMAL LOW (ref 3.87–5.11)
WBC: 7.1 10*3/uL (ref 4.0–10.5)

## 2011-09-16 MED ORDER — LORAZEPAM 2 MG/ML IJ SOLN
0.5000 mg | Freq: Once | INTRAMUSCULAR | Status: AC
  Administered 2011-09-16: 0.5 mg via INTRAVENOUS
  Filled 2011-09-16: qty 1

## 2011-09-16 MED ORDER — INSULIN ASPART 100 UNIT/ML ~~LOC~~ SOLN
0.0000 [IU] | Freq: Three times a day (TID) | SUBCUTANEOUS | Status: DC
  Administered 2011-09-17 (×3): 1 [IU] via SUBCUTANEOUS
  Administered 2011-09-18: 3 [IU] via SUBCUTANEOUS
  Administered 2011-09-18: 1 [IU] via SUBCUTANEOUS
  Administered 2011-09-19: 2 [IU] via SUBCUTANEOUS
  Administered 2011-09-20: 1 [IU] via SUBCUTANEOUS
  Administered 2011-09-20 (×2): 2 [IU] via SUBCUTANEOUS
  Administered 2011-09-21: 1 [IU] via SUBCUTANEOUS
  Filled 2011-09-16: qty 3

## 2011-09-16 MED ORDER — SODIUM CHLORIDE 0.9 % IJ SOLN
3.0000 mL | Freq: Two times a day (BID) | INTRAMUSCULAR | Status: DC
  Administered 2011-09-16: 3 mL via INTRAVENOUS

## 2011-09-16 MED ORDER — HYDROCHLOROTHIAZIDE 12.5 MG PO CAPS
12.5000 mg | ORAL_CAPSULE | Freq: Every day | ORAL | Status: DC
  Administered 2011-09-16: 12.5 mg via ORAL
  Filled 2011-09-16 (×2): qty 1

## 2011-09-16 MED ORDER — FUROSEMIDE 10 MG/ML IJ SOLN
20.0000 mg | Freq: Once | INTRAMUSCULAR | Status: AC
  Administered 2011-09-16: 20 mg via INTRAVENOUS
  Filled 2011-09-16: qty 2

## 2011-09-16 MED ORDER — CALCIUM CARBONATE-VITAMIN D 500-200 MG-UNIT PO TABS
1.0000 | ORAL_TABLET | Freq: Two times a day (BID) | ORAL | Status: DC
  Administered 2011-09-16 – 2011-09-21 (×10): 1 via ORAL
  Filled 2011-09-16 (×11): qty 1

## 2011-09-16 MED ORDER — FUROSEMIDE 10 MG/ML IJ SOLN
40.0000 mg | Freq: Two times a day (BID) | INTRAMUSCULAR | Status: DC
  Administered 2011-09-16 – 2011-09-18 (×4): 40 mg via INTRAVENOUS
  Filled 2011-09-16 (×5): qty 4

## 2011-09-16 MED ORDER — ONDANSETRON HCL 4 MG/2ML IJ SOLN: 4.0000 mg | Freq: Four times a day (QID) | INTRAMUSCULAR | Status: DC | PRN

## 2011-09-16 MED ORDER — CALCIUM CITRATE-VITAMIN D 315-200 MG-UNIT PO TABS: 1.0000 | ORAL_TABLET | Freq: Two times a day (BID) | ORAL | Status: DC

## 2011-09-16 MED ORDER — PIOGLITAZONE HCL 45 MG PO TABS
45.0000 mg | ORAL_TABLET | Freq: Every day | ORAL | Status: DC
  Administered 2011-09-17: 45 mg via ORAL
  Filled 2011-09-16 (×2): qty 1

## 2011-09-16 MED ORDER — OMEGA-3-ACID ETHYL ESTERS 1 G PO CAPS
1.0000 g | ORAL_CAPSULE | Freq: Three times a day (TID) | ORAL | Status: DC
  Administered 2011-09-16 – 2011-09-21 (×13): 1 g via ORAL
  Filled 2011-09-16 (×16): qty 1

## 2011-09-16 MED ORDER — METFORMIN HCL 500 MG PO TABS
500.0000 mg | ORAL_TABLET | Freq: Two times a day (BID) | ORAL | Status: DC
  Administered 2011-09-17: 500 mg via ORAL
  Filled 2011-09-16 (×3): qty 1

## 2011-09-16 MED ORDER — ACETAMINOPHEN 325 MG PO TABS: 650.0000 mg | ORAL_TABLET | ORAL | Status: DC | PRN

## 2011-09-16 MED ORDER — DILTIAZEM HCL ER COATED BEADS 180 MG PO CP24
180.0000 mg | ORAL_CAPSULE | Freq: Every day | ORAL | Status: DC
  Administered 2011-09-16 – 2011-09-18 (×3): 180 mg via ORAL
  Filled 2011-09-16 (×4): qty 1

## 2011-09-16 MED ORDER — LOSARTAN POTASSIUM 50 MG PO TABS
50.0000 mg | ORAL_TABLET | Freq: Every day | ORAL | Status: DC
  Filled 2011-09-16: qty 1

## 2011-09-16 MED ORDER — SODIUM CHLORIDE 0.9 % IV SOLN: 250.0000 mL | INTRAVENOUS | Status: DC | PRN

## 2011-09-16 MED ORDER — SODIUM CHLORIDE 0.9 % IJ SOLN: 3.0000 mL | INTRAMUSCULAR | Status: DC | PRN

## 2011-09-16 MED ORDER — ASPIRIN EC 81 MG PO TBEC
81.0000 mg | DELAYED_RELEASE_TABLET | Freq: Every day | ORAL | Status: DC
  Administered 2011-09-16 – 2011-09-17 (×2): 81 mg via ORAL
  Filled 2011-09-16 (×2): qty 1

## 2011-09-16 MED ORDER — LOSARTAN POTASSIUM-HCTZ 100-12.5 MG PO TABS: 1.0000 | ORAL_TABLET | Freq: Every day | ORAL | Status: DC

## 2011-09-16 MED ORDER — ASPIRIN EC 81 MG PO TBEC: 81.0000 mg | DELAYED_RELEASE_TABLET | Freq: Every day | ORAL | Status: DC

## 2011-09-16 MED ORDER — VITAMIN D3 25 MCG (1000 UNIT) PO TABS
2000.0000 [IU] | ORAL_TABLET | Freq: Every day | ORAL | Status: DC
  Administered 2011-09-16 – 2011-09-21 (×6): 2000 [IU] via ORAL
  Filled 2011-09-16 (×6): qty 2

## 2011-09-16 MED ORDER — ROSUVASTATIN CALCIUM 20 MG PO TABS
20.0000 mg | ORAL_TABLET | Freq: Every day | ORAL | Status: DC
  Administered 2011-09-16 – 2011-09-21 (×6): 20 mg via ORAL
  Filled 2011-09-16 (×6): qty 1

## 2011-09-16 MED ORDER — LOSARTAN POTASSIUM 50 MG PO TABS
100.0000 mg | ORAL_TABLET | Freq: Every day | ORAL | Status: DC
  Administered 2011-09-16: 100 mg via ORAL
  Filled 2011-09-16 (×2): qty 2

## 2011-09-16 MED ORDER — POTASSIUM CHLORIDE CRYS ER 20 MEQ PO TBCR
20.0000 meq | EXTENDED_RELEASE_TABLET | Freq: Every day | ORAL | Status: DC
  Administered 2011-09-16: 20 meq via ORAL
  Filled 2011-09-16 (×2): qty 1

## 2011-09-16 NOTE — ED Notes (Signed)
Clear, light yellow urine returned upon placement of foley catheter

## 2011-09-16 NOTE — ED Notes (Signed)
Vital signs stable. 

## 2011-09-16 NOTE — Progress Notes (Signed)
  Echocardiogram 2D Echocardiogram has been performed.  Joanna [name] Joanna [name] 09/16/2011, 5:08 PM

## 2011-09-16 NOTE — ED Notes (Signed)
Family at bedside. 

## 2011-09-16 NOTE — ED Notes (Addendum)
Last 3 days pt with difficulty breathing esp at night. Unable to sleep lying down. EMS arrived sats in low 90s. 34mg  lasix, 4mg  zofran, 1 nitro sl, EKG, 20g left hand

## 2011-09-16 NOTE — ED Notes (Signed)
PT absent from room for testing 

## 2011-09-16 NOTE — ED Notes (Signed)
Patient denies pain and is resting comfortably.  

## 2011-09-16 NOTE — ED Provider Notes (Addendum)
History     CSN: [id number] Arrival date & time: 09/16/2011 10:50 AM   First MD Initiated Contact with Patient 09/16/11 1110      Chief Complaint  Patient presents with  . Respiratory Distress    (Consider location/radiation/quality/duration/timing/severity/associated sxs/prior treatment) HPI Comments: Patient presents with slowly progressive and worsening shortness of breath over the last few weeks.  It is specifically worsened over the last 3 days and the patient has had orthopnea at home.  She's had a sleep completely upright due to her difficulty breathing.  She's noted worsening bilateral lower extremity swelling that is up to her knees.  She's been taking all of her heart and heart failure medications but her symptoms are worsening despite that.  Patient denies fevers or chest pain.  EMS was called today because of significant respiratory distress and the patient was found to be satting only 90% and a gave her Lasix and nitroglycerin in route.  They also placed her on BiPAP which is been continued here.  Patient is a 77 y.o. female presenting with shortness of breath. The history is provided by the patient and a relative.  Shortness of Breath  The current episode started 3 to 5 days ago. The problem occurs continuously. The problem has been gradually worsening. The problem is severe. The symptoms are relieved by nothing. The symptoms are aggravated by activity and a supine position. Associated symptoms include orthopnea and shortness of breath. Pertinent negatives include no chest pain, no chest pressure, no fever, no rhinorrhea, no sore throat, no cough and no wheezing.    Past Medical History  Diagnosis Date  . HTN (hypertension)   . Diabetes mellitus   . Coronary artery disease   . CHF (congestive heart failure)     Past Surgical History  Procedure Date  . Carotid endarterectomy     No family history on file.  History  Substance Use Topics  . Smoking status: Never  Smoker   . Smokeless tobacco: Not on file  . Alcohol Use: No    OB History    Grav Para Term Preterm Abortions TAB SAB Ect Mult Living                  Review of Systems  Constitutional: Negative.  Negative for fever and chills.  HENT: Negative.  Negative for sore throat and rhinorrhea.   Eyes: Negative.  Negative for discharge and redness.  Respiratory: Positive for shortness of breath. Negative for cough and wheezing.   Cardiovascular: Positive for orthopnea and leg swelling. Negative for chest pain.  Gastrointestinal: Negative.  Negative for nausea, vomiting, abdominal pain and diarrhea.  Genitourinary: Negative.  Negative for dysuria and vaginal discharge.  Musculoskeletal: Negative.  Negative for back pain.  Skin: Negative.  Negative for color change and rash.  Neurological: Negative.  Negative for syncope and headaches.  Hematological: Negative.  Negative for adenopathy.  Psychiatric/Behavioral: Negative.  Negative for confusion.  All other systems reviewed and are negative.    Allergies  Prednisone  Home Medications  No current outpatient prescriptions on file.  BP 152/86  Pulse 122  Temp(Src) 98 F (36.7 C) (Tympanic)  Resp 28  SpO2 99%  Physical Exam  Constitutional: She is oriented to person, place, and time. She appears well-developed and well-nourished.  Non-toxic appearance. She does not have a sickly appearance.  HENT:  Head: Normocephalic and atraumatic.  Eyes: Conjunctivae, EOM and lids are normal. Pupils are equal, round, and reactive to light. No scleral  icterus.  Neck: Trachea normal and normal range of motion. Neck supple.  Cardiovascular: Regular rhythm, normal heart sounds and normal pulses.  Tachycardia present.   Pulmonary/Chest: She has decreased breath sounds. She has no wheezes. She has no rhonchi.       Patient is on BiPAP when I enter the room and at this time is in no distress on the BiPAP.  She is able to talk in short sentences.    Abdominal: Soft. Normal appearance. There is no tenderness. There is no rebound, no guarding and no CVA tenderness.  Musculoskeletal: Normal range of motion.  Neurological: She is alert and oriented to person, place, and time. She has normal strength.  Skin: Skin is warm, dry and intact. No rash noted.  Psychiatric: She has a normal mood and affect. Her behavior is normal. Judgment and thought content normal.    ED Course  Procedures (including critical care time)  Results for orders placed during the hospital encounter of 09/16/11  CBC      Component Value Range   WBC 7.1  4.0 - 10.5 (K/uL)   RBC 2.94 (*) 3.87 - 5.11 (MIL/uL)   Hemoglobin 7.1 (*) 12.0 - 15.0 (g/dL)   HCT 16.1 (*) 09.6 - 46.0 (%)   MCV 79.9  78.0 - 100.0 (fL)   MCH 24.1 (*) 26.0 - 34.0 (pg)   MCHC 30.2  30.0 - 36.0 (g/dL)   RDW 04.5 (*) 40.9 - 15.5 (%)   Platelets 373  150 - 400 (K/uL)  BASIC METABOLIC PANEL      Component Value Range   Sodium 136  135 - 145 (mEq/L)   Potassium 4.7  3.5 - 5.1 (mEq/L)   Chloride 99  96 - 112 (mEq/L)   CO2 27  19 - 32 (mEq/L)   Glucose, Bld 152 (*) 70 - 99 (mg/dL)   BUN 25 (*) 6 - 23 (mg/dL)   Creatinine, Ser 8.11  0.50 - 1.10 (mg/dL)   Calcium 91.4 (*) 8.4 - 10.5 (mg/dL)   GFR calc non Af Amer 66 (*) >90 (mL/min)   GFR calc Af Amer 77 (*) >90 (mL/min)  PROTIME-INR      Component Value Range   Prothrombin Time 14.6  11.6 - 15.2 (seconds)   INR 1.12  0.00 - 1.49   APTT      Component Value Range   aPTT 29  24 - 37 (seconds)  CARDIAC PANEL(CRET KIN+CKTOT+MB+TROPI)      Component Value Range   Total CK 172  7 - 177 (U/L)   CK, MB 6.9 (*) 0.3 - 4.0 (ng/mL)   Troponin I <0.30  <0.30 (ng/mL)   Relative Index 4.0 (*) 0.0 - 2.5   PRO B NATRIURETIC PEPTIDE      Component Value Range   BNP, POC 8569.0 (*) 0 - 450 (pg/mL)   Dg Chest Portable 1 View  09/16/2011  *RADIOLOGY REPORT*  Clinical Data: Shortness of breath.  PORTABLE CHEST - 1 VIEW  Comparison: 03/22/2010  Findings:  There is cardiomegaly with vascular congestion. Bilateral interstitial prominence may reflect interstitial edema. Focal bibasilar atelectasis or infiltrates, left greater than right.  Question small bilateral effusions.  IMPRESSION: Cardiomegaly with interstitial edema.  Bibasilar atelectasis or infiltrates, left greater than right.  Small effusions.  Original Report Authenticated By: Cyndie Chime, M.D.      Date: 09/16/2011  Rate: 119  Rhythm: sinus tachycardia  QRS Axis: left  Intervals: normal  ST/T Wave abnormalities: indeterminate  Conduction Disutrbances:left bundle branch block  Narrative Interpretation:   Old EKG Reviewed: unchanged from 02/22/2010 which also had left bundle branch block and left axis deviation.    MDM  Shunt with symptoms of an acute CHF exacerbation on evaluation.  Her chest x-ray confirms that she is pulmonary edema.  Patient was doing well on BiPAP that was placed by EMS and has been continued here in the emergency department.  She is starting to diuresis with the Lasix given by EMS and here in the emergency department as well.  Given her significant pulmonary edema I did contact cardiology to consult for admission for her CHF exacerbation.  Her EKG shows a left bundle branch block which is present on her old EKG.  Her tachycardia is also resolving with treatment here as her heart rate is now down in the 80s to 90s.  Patient may be able to be weaned off of BiPAP in the next one to 2 hours with continued improvement.  Cardiology is at the bedside currently.  CRITICAL CARE Performed by: Emeline General A   Total critical care time: 34 minutes  Critical care time was exclusive of separately billable procedures and treating other patients.  Critical care was necessary to treat or prevent imminent or life-threatening deterioration.  Critical care was time spent personally by me on the following activities: development of treatment plan with patient and/or  surrogate as well as nursing, discussions with consultants, evaluation of patient's response to treatment, examination of patient, obtaining history from patient or surrogate, ordering and performing treatments and interventions, ordering and review of laboratory studies, ordering and review of radiographic studies, pulse oximetry and re-evaluation of patient's condition.         Nat Christen, MD 09/16/11 1409  Nat Christen, MD 09/16/11 [phone]

## 2011-09-16 NOTE — ED Notes (Signed)
Patient is resting comfortably. 

## 2011-09-16 NOTE — ED Notes (Signed)
Attempt to call report to [phone]. 

## 2011-09-16 NOTE — ED Notes (Signed)
Family at bedside. Updated on patient status/ plan of care

## 2011-09-16 NOTE — H&P (Signed)
[facility] Cardiology Hospital Admission Note Date: 09/16/2011  Patient name: Joanna [name] Medical record number: [id number] Date of birth: 15-Apr-1930 Age: 77 y.o. Gender: female PCP: Illene Regulus, MD, MD  Chief Complaint: SOB and LE edema.   History of Present Illness: Patient is a 77 year old white female with no prior cardiac history, normal recent 2-D echo in 09/2011 and a normal cardiac catheterization in 2008, with a past medical history of hypertension, diabetes mellitus type II, hyperlipidemia, and peripheral artery disease. Presents to [facility] ED with complaints of lower extremity edema orthopnea/PND for the past 3 weeks with progressive shortness of breath for the past 3 days, when the patient woke up this morning and she noted increased difficulty breathing, this prompted her to come to ED for further evaluation. Patient reports that she has not had any recent changes in her diet or medications, and denies being ill recently. Upon arrival to the ED she was found to be volume overloaded, we were contacted to admit the patient for management and further workup of her newly diagnosed CHF. Patient reports that otherwise she is feeling well, denies any chest pain, fever, chills, nausea or vomiting.    Meds:   aspirin EC 81 MG tablet    calcium citrate-vitamin D (CITRACAL+D) 315-200 MG-UNIT    cholecalciferol (VITAMIN D) 1000 UNITS tablet    diltiazem (CARDIZEM CD) 180 MG 24 hr capsule    fish oil-omega-3 fatty acids 1000 MG capsule    Garlic 1000 MG CAPS    losartan-hydrochlorothiazide (HYZAAR) 100-12.5 MG per tablet    metFORMIN (GLUCOPHAGE) 1000 MG tablet   pioglitazone (ACTOS) 45 MG tablet    potassium chloride SA (K-DUR,KLOR-CON) 20 MEQ tablet   simvastatin (ZOCOR) 80 MG tablet   Allergies: Prednisone Past Medical History  Diagnosis Date  . HTN (hypertension)   . Diabetes mellitus   . Peripheral artery disease   . Hyperlipidemia   . Anemia    Past Surgical History    Procedure Date  . Carotid endarterectomy    History reviewed. No pertinent family history. History   Social History  . Marital Status: Divorced    Spouse Name: N/A    Number of Children: 2  . Years of Education: N/A   Occupational History  . Works as a Firefighter   Social History Main Topics  . Smoking status: Never Smoker   . Smokeless tobacco: No  . Alcohol Use: No  . Drug Use: No  . Sexually Active: No   Social History Narrative  . Lives at home alone, is able to perform all ADL by herself    Review of Systems: Negative except per HPI   Physical Exam: Blood pressure 100/48, pulse 89, temperature 98 F (36.7 C), temperature source Tympanic, resp. rate 19, SpO2 92.00%. General:  alert, well-developed, and cooperative to examination.   Head:  normocephalic and atraumatic.   Eyes:  vision grossly intact, pupils equal, pupils round, pupils reactive to light, no injection and anicteric.   Mouth:  pharynx pink and moist, no erythema, and no exudates.   Neck:  supple, full ROM, no thyromegaly, JVD elevated to the angle of the mandible  Lungs:  normal respiratory effort, no accessory muscle use, bibasilar crackles Heart:  normal rate, regular rhythm, 2/6 SEM at LUSB and radiates to carotids. Abdomen:  soft, non-tender, normal bowel sounds, no distention, no guarding, no rebound tenderness, no hepatomegaly, and no splenomegaly.   Msk:  no joint swelling, no joint warmth,  and no redness over joints.   Pulses:  2+ DP/PT pulses bilaterally Extremities:  No cyanosis, clubbing, 3+ LE pitting edema Neurologic:  alert & oriented X3, cranial nerves II-XII intact, strength normal in all extremities, sensation intact to light touch. Skin:  turgor normal and no rashes.   Psych:  Oriented X3, memory intact for recent and remote, normally interactive, good eye contact, not anxious appearing, and not depressed appearing.    Lab results: Basic Metabolic Panel:  [facility] 09/16/11  1129  NA 136  K 4.7  CL 99  CO2 27  GLUCOSE 152*  BUN 25*  CREATININE 0.82  CALCIUM 10.9*  MG --  PHOS --   CBC:  Basename 09/16/11 1129  WBC 7.1  NEUTROABS --  HGB 7.1*  HCT 23.5*  MCV 79.9  PLT 373   Cardiac Enzymes:  Basename 09/16/11 1129  CKTOTAL 172  CKMB 6.9*  CKMBINDEX --  TROPONINI <0.30   BNP:  Basename 09/16/11 1215  POCBNP 8569.0*   Coagulation:  Basename 09/16/11 1129  LABPROT 14.6  INR 1.12    Imaging results:  Dg Chest Portable 1 View  09/16/2011  *RADIOLOGY REPORT*  Clinical Data: Shortness of breath.  PORTABLE CHEST - 1 VIEW  Comparison: 03/22/2010  Findings: There is cardiomegaly with vascular congestion. Bilateral interstitial prominence may reflect interstitial edema. Focal bibasilar atelectasis or infiltrates, left greater than right.  Question small bilateral effusions.  IMPRESSION: Cardiomegaly with interstitial edema.  Bibasilar atelectasis or infiltrates, left greater than right.  Small effusions.  Original Report Authenticated By: Cyndie Chime, M.D.    Other results: EKG: Sinus tach, LBBB, Unchanged from prior EKG.   Assessment & Plan by Problem: 77 yo female with PMH DM, HTN, HL presents with progressive PND and orthopnea for the past 3 weeks, presented to the ED today given worsening SOB. Exam and objective findings are consistent with CHF exacerbation.   CHF (congestive heart failure) - Unclear trigger for this as patient has no recent illness, change in meds or diet, and prior Echo from 09/2011 showed normal EF 55/60% with G1 diastolic dysfunction.  --2D Echo  --Lasix 40mg  IV BID  --strict i/o  --CE x3   ANEMIA - This has been ongoing for past several months with Hg as low as 7, it does not appear to have been worked up in the past.  --Hemocult stool > if positive consider GI consult.  --Anemia Panel   DM - Well controlled on oral antihyperglycemic agents.  --Hemoglobin A1c  --CBG q ACHS  --SSI-Sensitive    HYPERLIPIDEMIA -   --Check Fasting lipids in AM  --Continue home statin   HYPERTENSION  --Continue home meds   Signed: Jalicia Roszak 09/16/2011, 3:22 PM   History reviewed with the patient.  She presents with increased dyspnea and PND/orthopnea x 3 weeks.  She has had increased lower extremity edema.  She is not describing angina.  In the ER she has pulmonary edema on CXR and exam.  She is improved with Lasix.  She has a significant anemia that has been present before but for which we do not see an etiology in previous records  The patient exam reveals apical mid peaking AS murmur, JVD to the jaw at 45 degrees, moderate/severe bilateral edema..  All available labs, radiology testing, previous records reviewed. Agree with documented assessment and plan.  She will be treated with IV lasix, enzymes cycled and a repeat echo. Given the anemia I will treat with a low dose ASA  but no heparin at this time.  She will need an anemia work up during this admission.  Fayrene Fearing Hochrein  2:50 PM 09/05/2011

## 2011-09-16 NOTE — ED Notes (Signed)
cbg 194 

## 2011-09-16 NOTE — ED Notes (Signed)
Meal ordered for lunch 

## 2011-09-17 ENCOUNTER — Encounter (HOSPITAL_COMMUNITY): Payer: Self-pay | Admitting: General Practice

## 2011-09-17 DIAGNOSIS — I519 Heart disease, unspecified: Secondary | ICD-10-CM

## 2011-09-17 DIAGNOSIS — I5021 Acute systolic (congestive) heart failure: Secondary | ICD-10-CM

## 2011-09-17 LAB — BASIC METABOLIC PANEL
BUN: 23 mg/dL (ref 6–23)
BUN: 22 mg/dL (ref 6–23)
CO2: 30 mEq/L (ref 19–32)
Chloride: 95 mEq/L — ABNORMAL LOW (ref 96–112)
Creatinine, Ser: 0.99 mg/dL (ref 0.50–1.10)
Glucose, Bld: 138 mg/dL — ABNORMAL HIGH (ref 70–99)
Glucose, Bld: 149 mg/dL — ABNORMAL HIGH (ref 70–99)
Potassium: 3.4 mEq/L — ABNORMAL LOW (ref 3.5–5.1)
Potassium: 4.1 mEq/L (ref 3.5–5.1)
Sodium: 136 mEq/L (ref 135–145)

## 2011-09-17 LAB — CARDIAC PANEL(CRET KIN+CKTOT+MB+TROPI)
CK, MB: 5 ng/mL — ABNORMAL HIGH (ref 0.3–4.0)
CK, MB: 4.2 ng/mL — ABNORMAL HIGH (ref 0.3–4.0)
Relative Index: INVALID (ref 0.0–2.5)
Total CK: 98 U/L (ref 7–177)
Troponin I: <0.3 ng/mL (ref <0.30)

## 2011-09-17 LAB — CBC
HCT: 24.8 % — ABNORMAL LOW (ref 36.0–46.0)
Hemoglobin: 7.3 g/dL — ABNORMAL LOW (ref 12.0–15.0)
MCH: 23.7 pg — ABNORMAL LOW (ref 26.0–34.0)
MCH: 23.7 pg — ABNORMAL LOW (ref 26.0–34.0)
MCV: 80.5 fL (ref 78.0–100.0)
Platelets: UNDETERMINED 10*3/uL (ref 150–400)
RBC: 3.25 MIL/uL — ABNORMAL LOW (ref 3.87–5.11)
RBC: 3.08 MIL/uL — ABNORMAL LOW (ref 3.87–5.11)
WBC: 7.4 10*3/uL (ref 4.0–10.5)
WBC: 8.9 10*3/uL (ref 4.0–10.5)

## 2011-09-17 LAB — GLUCOSE, CAPILLARY
Glucose-Capillary: 129 mg/dL — ABNORMAL HIGH (ref 70–99)
Glucose-Capillary: 143 mg/dL — ABNORMAL HIGH (ref 70–99)
Glucose-Capillary: 174 mg/dL — ABNORMAL HIGH (ref 70–99)

## 2011-09-17 MED ORDER — POTASSIUM CHLORIDE CRYS ER 20 MEQ PO TBCR
20.0000 meq | EXTENDED_RELEASE_TABLET | Freq: Once | ORAL | Status: AC
  Administered 2011-09-17: 20 meq via ORAL

## 2011-09-17 MED ORDER — METFORMIN HCL 500 MG PO TABS
1000.0000 mg | ORAL_TABLET | Freq: Two times a day (BID) | ORAL | Status: DC
  Administered 2011-09-17: 1000 mg via ORAL
  Filled 2011-09-17 (×4): qty 2

## 2011-09-17 MED ORDER — POTASSIUM CHLORIDE CRYS ER 20 MEQ PO TBCR
20.0000 meq | EXTENDED_RELEASE_TABLET | Freq: Two times a day (BID) | ORAL | Status: AC
  Administered 2011-09-17 – 2011-09-18 (×3): 20 meq via ORAL
  Filled 2011-09-17 (×4): qty 1

## 2011-09-17 MED ORDER — INFLUENZA VIRUS VACC SPLIT PF IM SUSP
0.5000 mL | INTRAMUSCULAR | Status: AC
  Administered 2011-09-18: 0.5 mL via INTRAMUSCULAR
  Filled 2011-09-17: qty 0.5

## 2011-09-17 MED ORDER — SODIUM CHLORIDE 0.9 % IV SOLN: 250.0000 mL | INTRAVENOUS | Status: DC | PRN

## 2011-09-17 MED ORDER — CARVEDILOL 3.125 MG PO TABS
3.1250 mg | ORAL_TABLET | Freq: Two times a day (BID) | ORAL | Status: DC
  Administered 2011-09-17 – 2011-09-19 (×4): 3.125 mg via ORAL
  Filled 2011-09-17 (×8): qty 1

## 2011-09-17 MED ORDER — SODIUM CHLORIDE 0.9 % IJ SOLN
3.0000 mL | INTRAMUSCULAR | Status: DC | PRN
  Administered 2011-09-19: 3 mL via INTRAVENOUS

## 2011-09-17 MED ORDER — FERROUS SULFATE 325 (65 FE) MG PO TABS
325.0000 mg | ORAL_TABLET | Freq: Two times a day (BID) | ORAL | Status: DC
  Administered 2011-09-17 – 2011-09-21 (×8): 325 mg via ORAL
  Filled 2011-09-17 (×10): qty 1

## 2011-09-17 MED ORDER — SODIUM CHLORIDE 0.9 % IV SOLN
INTRAVENOUS | Status: DC
  Administered 2011-09-18 – 2011-09-19 (×2): via INTRAVENOUS

## 2011-09-17 MED ORDER — SPIRONOLACTONE 12.5 MG HALF TABLET
12.5000 mg | ORAL_TABLET | Freq: Every day | ORAL | Status: DC
  Administered 2011-09-17 – 2011-09-21 (×5): 12.5 mg via ORAL
  Filled 2011-09-17 (×5): qty 1

## 2011-09-17 MED ORDER — SODIUM CHLORIDE 0.9 % IJ SOLN
3.0000 mL | Freq: Two times a day (BID) | INTRAMUSCULAR | Status: DC
  Administered 2011-09-17 – 2011-09-21 (×7): 3 mL via INTRAVENOUS

## 2011-09-17 MED ORDER — DOCUSATE SODIUM 100 MG PO CAPS
100.0000 mg | ORAL_CAPSULE | Freq: Two times a day (BID) | ORAL | Status: DC
  Administered 2011-09-17 – 2011-09-21 (×9): 100 mg via ORAL
  Filled 2011-09-17 (×10): qty 1

## 2011-09-17 MED ORDER — LOSARTAN POTASSIUM 50 MG PO TABS
100.0000 mg | ORAL_TABLET | Freq: Every day | ORAL | Status: DC
  Administered 2011-09-17 – 2011-09-20 (×4): 100 mg via ORAL
  Filled 2011-09-17 (×4): qty 2

## 2011-09-17 NOTE — Progress Notes (Addendum)
Subjective:   Patient is a 77 year old white female with mild to moderate aortic stenosis by echo in 09/2010 and a normal cardiac catheterization in 2008, with a past medical history of hypertension, diabetes mellitus type II, hyperlipidemia, and peripheral artery disease.   Admitted yesterday with progressive SOB/edema/chest tightness over the last 3 weeks. Found to have acute systolic HF.EF 30-35% Symptoms much improved with diuresis. Echo suggests moderate to severe AS with question of LV apical thrombus.   Currently denies CP, orthopnea or PND.     Intake/Output Summary (Last 24 hours) at 09/17/11 1217 Last data filed at 09/17/11 1127  Gross per 24 hour  Intake    240 ml  Output   4050 ml  Net  -3810 ml    Current meds:    . aspirin EC  81 mg Oral Daily  . calcium-vitamin D  1 tablet Oral BID  . cholecalciferol  2,000 Units Oral Daily  . diltiazem  180 mg Oral Daily  . furosemide  40 mg Intravenous BID  . influenza  inactive virus vaccine  0.5 mL Intramuscular Tomorrow-1000  . insulin aspart  0-9 Units Subcutaneous TID WC  . losartan  100 mg Oral Daily  . metFORMIN  500 mg Oral BID WC  . omega-3 acid ethyl esters  1 g Oral TID  . potassium chloride SA  20 mEq Oral BID  . potassium chloride  20 mEq Oral Once  . rosuvastatin  20 mg Oral Daily  . sodium chloride  3 mL Intravenous Q12H  . spironolactone  12.5 mg Oral Daily  . DISCONTD: aspirin EC  81 mg Oral Daily  . DISCONTD: calcium citrate-vitamin D  1-2 tablet Oral BID  . DISCONTD: hydrochlorothiazide  12.5 mg Oral Daily  . DISCONTD: losartan  100 mg Oral Daily  . DISCONTD: losartan  50 mg Oral Daily  . DISCONTD: losartan-hydrochlorothiazide  1 tablet Oral Daily  . DISCONTD: pioglitazone  45 mg Oral Q breakfast  . DISCONTD: potassium chloride SA  20 mEq Oral Daily   Infusions:     Objective:  Blood pressure 108/61, pulse 80, temperature 98 F (36.7 C), temperature source Oral, resp. rate 20, height 5\' 3"  (1.6  m), weight 68.1 kg (150 lb 2.1 oz), SpO2 95.00%. Weight change:   Physical Exam: General:  Well appearing. No resp difficulty HEENT: normal Neck: supple. JVP to jaw  . Carotids 2+ bilat; + bilat bruits. No lymphadenopathy or thryomegaly appreciated. Cor: PMI nondisplaced. Regular rate & rhythm. No rubs, gallops or 2/6  AS murmur S2 well preserved Lungs: LLL crackles on 2 liters Abdomen: soft, nontender, nondistended. No hepatosplenomegaly. No bruits or masses. Good bowel sounds. Extremities: no cyanosis, clubbing, rash, 1-2+ extremity edema Neuro: alert & orientedx3, cranial nerves grossly intact. moves all 4 extremities w/o difficulty. Affect pleasant  Telemetry:   Lab Results: Basic Metabolic Panel:  Lab 09/17/11 1610 09/16/11 1129  NA 136 136  K 3.4* 4.7  CL 97 99  CO2 30 27  GLUCOSE 138* 152*  BUN 23 25*  CREATININE 0.95 0.82  CALCIUM 10.3 10.9*  MG -- --  PHOS -- --   Liver Function Tests:  Lab 09/16/11 1539  AST 27  ALT 22  ALKPHOS 33*  BILITOT 0.3  PROT 5.8*  ALBUMIN 3.4*   No results found for this basename: LIPASE:5,AMYLASE:5 in the last 168 hours No results found for this basename: AMMONIA:5 in the last 168 hours CBC:  Lab 09/16/11 1129  WBC  7.1  NEUTROABS --  HGB 7.1*  HCT 23.5*  MCV 79.9  PLT 373   Cardiac Enzymes:  Lab 09/17/11 0830 09/17/11 0116 09/16/11 1539 09/16/11 1129  CKTOTAL 98 121 154 172  CKMB 4.2* 5.0* 6.6* 6.9*  CKMBINDEX -- -- -- --  TROPONINI <0.30 <0.30 <0.30 <0.30   BNP:  Lab 09/16/11 1215  POCBNP 8569.0*   CBG:  Lab 09/17/11 1110 09/17/11 0610 09/16/11 2240  GLUCAP 143* 129* 179*   Microbiology: No results found for this basename: cult   No results found for this basename: CULT:2,SDES:2 in the last 168 hours  Imaging: Dg Chest Portable 1 View  09/16/2011  *RADIOLOGY REPORT*  Clinical Data: Shortness of breath.  PORTABLE CHEST - 1 VIEW  Comparison: 03/22/2010  Findings: There is cardiomegaly with vascular  congestion. Bilateral interstitial prominence may reflect interstitial edema. Focal bibasilar atelectasis or infiltrates, left greater than right.  Question small bilateral effusions.  IMPRESSION: Cardiomegaly with interstitial edema.  Bibasilar atelectasis or infiltrates, left greater than right.  Small effusions.  Original Report Authenticated By: Cyndie Chime, M.D.     ASSESSMENT:  1. Acute systolic heart failure EF 30-35% 2. HTN 3. DM 4. Hyperlipidemia 5. Anemia 6. questionable apical thrombus 7. Hypokalemia  PLAN/DISCUSSION:  77 y/o woman with h/o mild to moderate AS, DM2 admitted with acute systolic HF. EF 30%. Improving with diuresis. Major question is what is etiology of her cardiomyopathy. On exam, AS doesn't appear severe enough to cause LV dysfunction. Thus will proceed with R and L heart cath to rule out ischemic heart disease and assess hemodynamics. If no LV clot on echo will also attempt to cross aortic valve and further interrogate its severity. (gradients on echo may be underestimated due to LV dysfunction). If LV clot will likely need TEE. Start low-dose b-blocker.   Will need anemia anemia work-up. Check stool guaiac and labs.   MD time spent 40 minutes.

## 2011-09-17 NOTE — Plan of Care (Signed)
Problem: Limited Adherence to Nutrition-Related Recommendations (NB-1.6) Goal: Nutrition education Formal process to instruct or train a patient/client in a skill or to impart knowledge to help patients/clients voluntarily manage or modify food choices and eating behavior to maintain or improve health.  Outcome: Completed/Met Date Met:  09/17/11 Chart reviewed. Discussed CHF diet guidelines with patient; handout from [employer] provided. PO intake 100%. No further nutrition intervention at this time.

## 2011-09-17 NOTE — Progress Notes (Signed)
Clinical Social worker completed the psychosocial assessment which can be found in the shadow chart. Patient has no history of falls, continues to work and is very independent at home. Patient also has no desire for home health services. Clinical Social Worker will sign off for now as social work intervention is no longer needed. Please consult [place] again if new need arises.

## 2011-09-17 NOTE — Progress Notes (Signed)
Subjective:  Patient is a 77 year old white female with mild to moderate aortic stenosis by echo in 09/2010 and a normal cardiac catheterization in 2008, with a past medical history of hypertension, diabetes mellitus type II, hyperlipidemia, and peripheral artery disease.   Admitted yesterday with progressive SOB/edema over the last 3 weeks. She works full time and but over the last 3 week she was unable to perform her job due to SOB. She had +PND/Othopnea and SOB at rest. PTA independent and living alone. Followed by Dr Myrtis Ser for HTN.   Breathing better today after Lasix 60 mg IV. Denis SOB./PND/CP. Diuresed 2 liters. ECHO EF decreased from 55%-60 09/2010 to 30-35%. Aortic valve moderate (to severe) stenosis noted compared to mild stenosis last year.   Denies CP, orthopnea or PND.     Intake/Output Summary (Last 24 hours) at 09/17/11 0853 Last data filed at 09/17/11 0834  Gross per 24 hour  Intake    240 ml  Output   2250 ml  Net  -2010 ml    Current meds:    . aspirin EC  81 mg Oral Daily  . calcium-vitamin D  1 tablet Oral BID  . cholecalciferol  2,000 Units Oral Daily  . diltiazem  180 mg Oral Daily  . furosemide  20 mg Intravenous Once  . furosemide  40 mg Intravenous BID  . losartan  100 mg Oral Daily   And  . hydrochlorothiazide  12.5 mg Oral Daily  . insulin aspart  0-9 Units Subcutaneous TID WC  . LORazepam  0.5 mg Intravenous Once  . metFORMIN  500 mg Oral BID WC  . omega-3 acid ethyl esters  1 g Oral TID  . pioglitazone  45 mg Oral Q breakfast  . potassium chloride SA  20 mEq Oral Daily  . rosuvastatin  20 mg Oral Daily  . sodium chloride  3 mL Intravenous Q12H  . DISCONTD: aspirin EC  81 mg Oral Daily  . DISCONTD: calcium citrate-vitamin D  1-2 tablet Oral BID  . DISCONTD: losartan  50 mg Oral Daily  . DISCONTD: losartan-hydrochlorothiazide  1 tablet Oral Daily   Infusions:     Objective:  Blood pressure 108/61, pulse 80, temperature 98 F (36.7 C),  temperature source Oral, resp. rate 20, height 5\' 3"  (1.6 m), weight 68.1 kg (150 lb 2.1 oz), SpO2 95.00%. Weight change:   Physical Exam: General:  Well appearing. No resp difficulty HEENT: normal Neck: supple. JVP to jaw  . Carotids 2+ bilat; + bilat bruits. No lymphadenopathy or thryomegaly appreciated. Cor: PMI nondisplaced. Regular rate & rhythm. No rubs, gallops or 2/6  AS murmur S2 well preserved Lungs: LLL crackles on 2 liters Abdomen: soft, nontender, nondistended. No hepatosplenomegaly. No bruits or masses. Good bowel sounds. Extremities: no cyanosis, clubbing, rash, 1-2+ extremity edema Neuro: alert & orientedx3, cranial nerves grossly intact. moves all 4 extremities w/o difficulty. Affect pleasant  Telemetry:   Lab Results: Basic Metabolic Panel:  Lab 09/17/11 1610 09/16/11 1129  NA 136 136  K 3.4* 4.7  CL 97 99  CO2 30 27  GLUCOSE 138* 152*  BUN 23 25*  CREATININE 0.95 0.82  CALCIUM 10.3 10.9*  MG -- --  PHOS -- --   Liver Function Tests:  Lab 09/16/11 1539  AST 27  ALT 22  ALKPHOS 33*  BILITOT 0.3  PROT 5.8*  ALBUMIN 3.4*   No results found for this basename: LIPASE:5,AMYLASE:5 in the last 168 hours No results found  for this basename: AMMONIA:5 in the last 168 hours CBC:  Lab 09/16/11 1129  WBC 7.1  NEUTROABS --  HGB 7.1*  HCT 23.5*  MCV 79.9  PLT 373   Cardiac Enzymes:  Lab 09/17/11 0116 09/16/11 1539 09/16/11 1129  CKTOTAL 121 154 172  CKMB 5.0* 6.6* 6.9*  CKMBINDEX -- -- --  TROPONINI <0.30 <0.30 <0.30   BNP:  Lab 09/16/11 1215  POCBNP 8569.0*   CBG:  Lab 09/17/11 0610 09/16/11 2240  GLUCAP 129* 179*   Microbiology: No results found for this basename: cult   No results found for this basename: CULT:2,SDES:2 in the last 168 hours  Imaging: Dg Chest Portable 1 View  09/16/2011  *RADIOLOGY REPORT*  Clinical Data: Shortness of breath.  PORTABLE CHEST - 1 VIEW  Comparison: 03/22/2010  Findings: There is cardiomegaly with  vascular congestion. Bilateral interstitial prominence may reflect interstitial edema. Focal bibasilar atelectasis or infiltrates, left greater than right.  Question small bilateral effusions.  IMPRESSION: Cardiomegaly with interstitial edema.  Bibasilar atelectasis or infiltrates, left greater than right.  Small effusions.  Original Report Authenticated By: Cyndie Chime, M.D.     ASSESSMENT:  1. Acute systolic heart failure EF 30-35% 2. HTN 3. DM 4. Hyperlipidemia 5. Anemia 6. questionable apical thrombus 7. Hypokalemia PLAN/DISCUSSION: 77 year old female with new acute systolic heart failure with decreased EF noted. Progressive aortic stenosis noted. Volume status remains elevated.WIill stop Actos due systolic heart failure and volume overload. Will order 2 ECHO with contrast to further evaluate possible apical thrombus. Add Spironolactone 12.5 mg daily. Give extra 20 meq KDUR this am and then 20 meq KDUR bid. Will case management and dietitian for HF needs   LOS: 1 day   CLEGG,AMY, NP 09/17/2011, 8:53 AM  Patient seen and examined with Tonye Becket, NP. We discussed all aspects of the encounter. I agree with the assessment and plan as stated above. See my full  note.   Aramis Zobel BensimhonMD 12:17 PM

## 2011-09-17 NOTE — Progress Notes (Signed)
  Echocardiogram 2D Echocardiogram with Definity has been performed.  Cathie Beams Deneen 09/17/2011, 12:53 PM

## 2011-09-18 DIAGNOSIS — D509 Iron deficiency anemia, unspecified: Secondary | ICD-10-CM

## 2011-09-18 LAB — GLUCOSE, CAPILLARY
Glucose-Capillary: 155 mg/dL — ABNORMAL HIGH (ref 70–99)
Glucose-Capillary: 128 mg/dL — ABNORMAL HIGH (ref 70–99)
Glucose-Capillary: 195 mg/dL — ABNORMAL HIGH (ref 70–99)

## 2011-09-18 LAB — BASIC METABOLIC PANEL
BUN: 31 mg/dL — ABNORMAL HIGH (ref 6–23)
CO2: 32 mEq/L (ref 19–32)
Chloride: 97 mEq/L (ref 96–112)
GFR calc Af Amer: 58 mL/min — ABNORMAL LOW (ref >90)
Glucose, Bld: 146 mg/dL — ABNORMAL HIGH (ref 70–99)
Potassium: 3.9 mEq/L (ref 3.5–5.1)

## 2011-09-18 LAB — CBC
MCH: 24.1 pg — ABNORMAL LOW (ref 26.0–34.0)
Platelets: 308 10*3/uL (ref 150–400)
RBC: 2.82 MIL/uL — ABNORMAL LOW (ref 3.87–5.11)

## 2011-09-18 MED ORDER — METFORMIN HCL 500 MG PO TABS
1000.0000 mg | ORAL_TABLET | Freq: Two times a day (BID) | ORAL | Status: DC
  Administered 2011-09-21: 1000 mg via ORAL
  Filled 2011-09-18 (×3): qty 2

## 2011-09-18 MED ORDER — FUROSEMIDE 40 MG PO TABS
40.0000 mg | ORAL_TABLET | Freq: Two times a day (BID) | ORAL | Status: DC
  Filled 2011-09-18 (×2): qty 1

## 2011-09-18 MED ORDER — FUROSEMIDE 10 MG/ML IJ SOLN
20.0000 mg | Freq: Once | INTRAMUSCULAR | Status: AC
  Administered 2011-09-19: 20 mg via INTRAVENOUS

## 2011-09-18 MED ORDER — SODIUM CHLORIDE 0.9 % IJ SOLN: 3.0000 mL | INTRAMUSCULAR | Status: DC | PRN

## 2011-09-18 MED ORDER — SODIUM CHLORIDE 0.9 % IV SOLN: 250.0000 mL | INTRAVENOUS | Status: DC | PRN

## 2011-09-18 MED ORDER — SODIUM CHLORIDE 0.9 % IJ SOLN: 3.0000 mL | Freq: Two times a day (BID) | INTRAMUSCULAR | Status: DC

## 2011-09-18 NOTE — Consult Note (Signed)
[facility] Consult: 2:44 PM 09/18/2011   Referring Provider: Bishop Dublin  Primary Care Physician:  Illene Regulus, MD, MD Primary Gastroenterologist:  gessner   Reason for Consultation:  anemia  HPI: Joanna [name] is a 77 y.o. female.  Admitted  2 days ago with CHF: sob and edema, fatigue.  Anemic with Hgb of 7.1, mcv normal.   Hemmoccult status unknown.  Last colonoscopy cc'd below was in 2009.  Hgb in June 2011 was 9.0 6/3/2011pre carotid artery surgery and 7.3 following surgery  03/23/2010.  Did not get transfused.  Not taking iron at home. Takes up to 4 full strength Aspirin a day, one for heart the others for joint pain and stiffness. Took it for 2 weeks after carotid surgery but it caused stomach pain and she stopped it. Eats red meet rarely, every 3 months or so.  Diet mostly grains, fruits, cereals. No nausea.  Appetite decreased last few weeks,  No n/v.  No belly pain. Occasional constipation relieved by stools softener.   Past Medical History  Diagnosis Date  . HTN (hypertension)   . Diabetes mellitus   . Peripheral artery disease   . Hyperlipidemia   . Anemia   . PONV (postoperative nausea and vomiting)   . Shortness of breath   . CHF (congestive heart failure)   . Arthritis     Past Surgical History  Procedure Date  . Carotid endarterectomy     Prior to Admission medications   Medication Sig Start Date End Date Taking? Authorizing Provider  aspirin EC 81 MG tablet Take 81 mg by mouth daily.     Yes Historical Provider, MD  calcium citrate-vitamin D (CITRACAL+D) 315-200 MG-UNIT per tablet Take 1-2 tablets by mouth 2 (two) times daily.    Yes Historical Provider, MD  cholecalciferol (VITAMIN D) 1000 UNITS tablet Take 2,000 Units by mouth daily.     Yes Historical Provider, MD  diltiazem (CARDIZEM CD) 180 MG 24 hr capsule Take 180 mg by mouth daily.     Yes Historical Provider, MD  fish oil-omega-3 fatty acids 1000 MG capsule Take  1 g by mouth 3 (three) times daily.     Yes Historical Provider, MD  Garlic 1000 MG CAPS Take 1 capsule by mouth daily.     Yes Historical Provider, MD  losartan-hydrochlorothiazide (HYZAAR) 100-12.5 MG per tablet Take 1 tablet by mouth daily.     Yes Historical Provider, MD  metFORMIN (GLUCOPHAGE) 1000 MG tablet Take 500 mg by mouth 2 (two) times daily with a meal.     Yes Historical Provider, MD  pioglitazone (ACTOS) 45 MG tablet Take 45 mg by mouth daily.     Yes Historical Provider, MD  potassium chloride SA (K-DUR,KLOR-CON) 20 MEQ tablet Take 20 mEq by mouth daily.     Yes Historical Provider, MD  simvastatin (ZOCOR) 80 MG tablet Take 80 mg by mouth daily.     Yes Historical Provider, MD    Scheduled Meds:    . calcium-vitamin D  1 tablet Oral BID  . carvedilol  3.125 mg Oral BID WC  . cholecalciferol  2,000 Units Oral Daily  . diltiazem  180 mg Oral Daily  . docusate sodium  100 mg Oral BID  . ferrous sulfate  325 mg Oral BID WC  . influenza  inactive virus vaccine  0.5 mL Intramuscular Tomorrow-1000  . insulin aspart  0-9 Units Subcutaneous TID WC  . losartan  100 mg Oral Daily  . metFORMIN  1,000 mg Oral BID WC  . omega-3 acid ethyl esters  1 g Oral TID  . potassium chloride SA  20 mEq Oral BID  . rosuvastatin  20 mg Oral Daily  . sodium chloride  3 mL Intravenous Q12H  . spironolactone  12.5 mg Oral Daily  . DISCONTD: furosemide  40 mg Intravenous BID  . DISCONTD: furosemide  40 mg Oral BID  . DISCONTD: metFORMIN  1,000 mg Oral BID WC  . DISCONTD: metFORMIN  500 mg Oral BID WC   Infusions:    . sodium chloride 10 mL/hr at 09/18/11 0553   PRN Meds: sodium chloride, sodium chloride, acetaminophen, ondansetron (ZOFRAN) IV, sodium chloride   Allergies as of 09/16/2011 - Review Complete 09/16/2011  Allergen Reaction Noted  . Other Hives 09/16/2011  . Prednisone Hives and Other (See Comments) 09/16/2011    History reviewed. No pertinent family history.  History    Social History  . Marital Status: Divorced    Spouse Name: N/A    Number of Children: N/A  . Years of Education: N/A   Occupational History  . Not on file.   Social History Main Topics  . Smoking status: Never Smoker   . Smokeless tobacco: Never Used  . Alcohol Use: No  . Drug Use: No  . Sexually Active: Not Currently    Birth Control/ Protection: Post-menopausal   Other Topics Concern  . Not on file   Social History Narrative  . No narrative on file    REVIEW OF SYSTEMS: Constitutional:  Weakness, fatigue ENT:  No epistaxis Pulm:  Dyspnea, better following diuretics CV:  No chest pain GU:  No dysuria, + increased urine after diuretics GI:  Above.  No dysphagia Heme:  No unusual bleeding.    Transfusions:  None before Neuro:  No HA, no syncope Derm:  No rash, itching. + dry skin Endocrine:  No sweats Immunization:  Flu shot up to datwe Travel:  None out of state   PHYSICAL EXAM: Vital signs in last 24 hours: Temp:  [98.5 F (36.9 C)-99.2 F (37.3 C)] 99.2 F (37.3 C) (11/29 1337) Pulse Rate:  [90-95] 95  (11/29 1337) Resp:  [20] 20  (11/29 1337) BP: (85-116)/(47-63) 85/47 mmHg (11/29 1345) SpO2:  [90 %-97 %] 97 % (11/29 1337) Weight:  [67 kg (147 lb 11.3 oz)] 147 lb 11.3 oz (67 kg) (11/29 0607)  Last BM Date: 09/17/11  General: Looks well Head:  No trauma  Eyes:  No pallor Ears:  Slight HOH  Nose:  No discharge Mouth:  Pink, moist MM Neck:  no JVD Lungs:  Clear B.  No dyspnea with speach Heart: RRR. Harsh systolic murmer Abdomen:  Soft, NT, ND, BS active.   Rectal: FOB- on good specimen.  No mass   Musc/Skeltl: no joint swelling Extremities:  Slight pedal edema  Neurologic:  Oriented to place,time, self, condition Skin:  No rash Tattoos:  none Psych:  Cooperative, relaxed  Intake/Output from previous day: 11/28 0701 - 11/29 0700 In: 606.5 [P.O.:600; I.V.:6.5] Out: 2600 [Urine:2600] Intake/Output this shift: Total I/O In: 240  [P.O.:240] Out: 1400 [Urine:1400]  LAB RESULTS:  Basename 09/18/11 0900 09/17/11 1530 09/17/11 1206  WBC 7.4 8.9 7.4  HGB 6.8* 7.3* 7.7*  HCT 22.6* 24.8* 26.4*  PLT 308 347 PLATELET CLUMPS NOTED ON SMEAR, UNABLE TO ESTIMATE   BMET Lab Results  Component Value Date   NA 136 09/18/2011   NA 139 09/17/2011   NA 136 09/17/2011  K 3.9 09/18/2011   K 4.1 09/17/2011   K 3.4* 09/17/2011   CL 97 09/18/2011   CL 95* 09/17/2011   CL 97 09/17/2011   CO2 32 09/18/2011   CO2 34* 09/17/2011   CO2 30 09/17/2011   GLUCOSE 146* 09/18/2011   GLUCOSE 149* 09/17/2011   GLUCOSE 138* 09/17/2011   BUN 31* 09/18/2011   BUN 22 09/17/2011   BUN 23 09/17/2011   CREATININE 1.03 09/18/2011   CREATININE 0.99 09/17/2011   CREATININE 0.95 09/17/2011   CALCIUM 10.7* 09/18/2011   CALCIUM 11.1* 09/17/2011   CALCIUM 10.3 09/17/2011   LFT Lab Results  Component Value Date   PROT 5.8* 09/16/2011   PROT 7.0 11/15/2010   PROT 6.9 03/22/2010   ALBUMIN 3.4* 09/16/2011   ALBUMIN 4.2 11/15/2010   ALBUMIN 4.1 03/22/2010   AST 27 09/16/2011   AST 21 11/15/2010   AST 20 03/22/2010   ALT 22 09/16/2011   ALT 14 11/15/2010   ALT 14 03/22/2010   ALKPHOS 33* 09/16/2011   ALKPHOS 44 11/15/2010   ALKPHOS 42 03/22/2010   BILITOT 0.3 09/16/2011   BILITOT 0.6 11/15/2010   BILITOT 0.6 03/22/2010   BILIDIR <0.1 09/16/2011   BILIDIR 0.1 11/15/2010   BILIDIR 0.1 10/26/2009   IBILI NOT CALCULATED 09/16/2011   PT/INR Lab Results  Component Value Date   INR 1.12 09/17/2011   INR 1.12 09/16/2011   INR 1.02 03/22/2010     RADIOLOGY STUDIES: No results found.  ENDOSCOPIC STUDIES:  Never had an EGD    Endo/oscopies by Iva Boop, MD on 02/24/2008 10:21 AM       Colonoscopy  Procedure date: 02/24/2008  Location: [facility].  Surveillance of:  Adenomatous Polyp(s). This is an initial surveillance exam. Initial  polypectomy was performed in 2006. in Mar. 1-2 Polyps were found at  Index Exam. Largest polyp  removed was 6 to 9 mm. Prior polyp located  in both proximal and distal colon.  Increased Risk Screening:  For family history of colorectal neoplasia, in parent   Exam:  Extent of exam reached: Cecum, extent intended: Cecum. The cecum was  identified by appendiceal orifice and IC valve. Patient position: on  left side. Time to Cecum: 00:06:22. Time for Withdrawl: 00:12:57.  Colon retroflexion performed. Images taken. ASA Classification: II.  Tolerance: excellent.  Monitoring:  Pulse and BP monitoring, Oximetry used. Supplemental O2 given.  Colon Prep  Used MiraLax for colon prep. Prep results: good.  Sedation Meds:  Patient assessed and found to be appropriate for moderate (conscious)  sedation. Fentanyl 50 mcg. given IV. Versed 7 mg. given IV.  Findings  - DIVERTICULOSIS: Ascending Colon to Sigmoid Colon. Comments: mod-  severe in sigmoid.  POLYP: Cecum, Maximum size: 3 mm. diminutive, sessile polyp.  Procedure: biopsy without cautery, removed, retrieved, Polyp sent  to pathology.  HEMORRHOIDS: External. Size: Small.  Assessment  Comments:  1) 3 MM CECAL POLYP REMOVED  2) PAN-DIVERTICULOSIS  3) SMALL EXTERNAL HEMORRHOIDS  4) GOOD PREP  5) HX OF ADENOMAS AND FHX COLON CANCER   Await pathology to schedule patient. Colonoscopy, LIKELY 5 YRS IF  HEALTHY,   SURGICAL PATHOLOGY  Case #: [id number]  Patient Name: SUNDERLAND, Avanell H.  Office Chart Number: [id number]  MRN: [id number]  Pathologist: Alden Server A. Delila Spence, MD  DOB/Age [birth year] (Age: 35) Gender: F  Date Taken: 02/24/2008  Date Received: 02/24/2008  FINAL DIAGNOSIS  MICROSCOPIC EXAMINATION AND DIAGNOSIS  COLON, CECUM, POLYP(S): ADENOMATOUS POLYP(S). NO  HIGH GRADE  DYSPLASIA OR INVASIVE MALIGNANCY IDENTIFIED.   Date Reported: 02/27/2008 Lyn Hollingshead. Delila Spence, MD      IMPRESSION: 1.  Iron deficiency anemia.  FOB-.  Has been anemic since at least 03/2010.  Did not tolerate po iron before, seems to be tolerating at present.  Wonder  if this is lack of dietary iron since she is fob- and gives no hx to suggest blood loss. 2 units of blood ordered. 2.  CHF 3.  Hx colon adenoma, due repeat colonoscopy 2014.  PLAN 1.  See dr Lamar Sprinkles reccommendations.   LOS: 2 days   Jennye Moccasin  09/18/2011, 2:44 PM Pager: [phone]  GI ATTENDING HX REVIEWED. PATIENT SEEN AND EXAMINED. LABS AND PRIOR ENDOSCOPIC PROCEDURES REVIEWED. AGREE WITH H&P AS OUTLINED ABOVE. ELDERLY FEMALE WITH SIGNIFICANT MEDICAL PROBLEMS AS OUTLINED. ASKED TO SEE FOR ANEMIA. THIS IS CHRONIC, LIKELY IRON DEFICIENT, AND NOT SIGNIFICANTLY CHANGED OVER THE PAST YEAR. GI ROS IS NEGATIVE. STOOLS ARE NEGATIVE FOR OCCULT BLOOD. PRIOR COLONOSCOPY IN 2006 AND AGAIN IN 2009 AS OUTLINED. HER ANEMIA MAY BE DUE TO OCCULT BLOOD LOSS IN GUT (THOUGH NO EVIDENCE) FROM SUCH ENTITIES AS AVMs OR EROSIONS.  RECOMMEND: 1. TRANSFUSE TO DESIRED HG                            2. IRON REPLACEMENT THERAPY - FESO4 325MG  BID                            3. OUT PATIENT MONITORING OF CBC BY PCP  - MONTHLY                            4. CONSIDER MORE ADVANCED GI WORKUP (EGD/CAPSULE ENDOSCOPY) IF ANEMIA REFRACTORY TO IRON, RECURRENT, OR IN ASSOCIATION WITH GI BLEEDING (OCCULT OR OVERT). DISCUSSED WITH PATIENT. WE ARE AVAILABLE PRN. WILL SIGN OFF. THANKS...JNP [phone]

## 2011-09-18 NOTE — Progress Notes (Signed)
Subjective:  Patient is a 77 year old white female with mild to moderate aortic stenosis by echo in 09/2010 and a normal cardiac catheterization in 2008, with a past medical history of hypertension, diabetes mellitus type II, hyperlipidemia, and peripheral artery disease  11/28 ECHO with contrast - EF 25-30%NO LV thrombus Beta blocker initiated yesterday blood pressure stable.   Feels better. Denies dyspnea when ambulating in room.  Denies SOB/PND/[place].  Weight decreased 3 pounds. Total weight loss 10 pounds since admit. SW consult appreciated no HH needs identified. BM yesterday not sent for guiac. Hgb continues to drop. No obvious sites of bleeding.   Intake/Output Summary (Last 24 hours) at 09/18/11 0902 Last data filed at 09/18/11 0830  Gross per 24 hour  Intake  366.5 ml  Output   2600 ml  Net -2233.5 ml    Current meds:    . calcium-vitamin D  1 tablet Oral BID  . carvedilol  3.125 mg Oral BID WC  . cholecalciferol  2,000 Units Oral Daily  . diltiazem  180 mg Oral Daily  . docusate sodium  100 mg Oral BID  . ferrous sulfate  325 mg Oral BID WC  . furosemide  40 mg Intravenous BID  . influenza  inactive virus vaccine  0.5 mL Intramuscular Tomorrow-1000  . insulin aspart  0-9 Units Subcutaneous TID WC  . losartan  100 mg Oral Daily  . metFORMIN  1,000 mg Oral BID WC  . omega-3 acid ethyl esters  1 g Oral TID  . potassium chloride SA  20 mEq Oral BID  . potassium chloride  20 mEq Oral Once  . rosuvastatin  20 mg Oral Daily  . sodium chloride  3 mL Intravenous Q12H  . spironolactone  12.5 mg Oral Daily  . DISCONTD: aspirin EC  81 mg Oral Daily  . DISCONTD: hydrochlorothiazide  12.5 mg Oral Daily  . DISCONTD: losartan  100 mg Oral Daily  . DISCONTD: metFORMIN  500 mg Oral BID WC  . DISCONTD: pioglitazone  45 mg Oral Q breakfast  . DISCONTD: potassium chloride SA  20 mEq Oral Daily  . DISCONTD: sodium chloride  3 mL Intravenous Q12H   Infusions:    . sodium chloride  10 mL/hr at 09/18/11 0553     Objective:  Blood pressure 116/63, pulse 90, temperature 98.6 F (37 C), temperature source Oral, resp. rate 20, height 5\' 3"  (1.6 m), weight 67 kg (147 lb 11.3 oz), SpO2 90.00%. Weight change: -2.6 kg (-5 lb 11.7 oz)  Physical Exam: General:  Well appearing. Somewhat pale. No resp difficulty HEENT: normal Neck: supple. JVP 10. Carotids 2+ bilat; no bruits. No lymphadenopathy or thryomegaly appreciated. Cor: PMI nondisplaced. Regular rate & rhythm. No rubs, gallops or 2/6 AS murmurs. Lungs: clear on room air Abdomen: soft, nontender, nondistended. No hepatosplenomegaly. No bruits or masses. Good bowel sounds. Extremities: no cyanosis, clubbing, rash, trace lower extremity edema Neuro: alert & orientedx3, cranial nerves grossly intact. moves all 4 extremities w/o difficulty. Affect pleasant  Telemetry: SR BBB  Lab Results: Basic Metabolic Panel:  Lab 09/18/11 4540 09/17/11 1530 09/17/11 0130 09/16/11 1129  NA 136 139 136 136  K 3.9 4.1 -- --  CL 97 95* 97 99  CO2 32 34* 30 27  GLUCOSE 146* 149* 138* 152*  BUN 31* 22 23 25*  CREATININE 1.03 0.99 0.95 0.82  CALCIUM 10.7* 11.1* 10.3 10.9*  MG -- -- -- --  PHOS -- -- -- --  Liver Function Tests:  Lab 09/16/11 1539  AST 27  ALT 22  ALKPHOS 33*  BILITOT 0.3  PROT 5.8*  ALBUMIN 3.4*   No results found for this basename: LIPASE:5,AMYLASE:5 in the last 168 hours No results found for this basename: AMMONIA:5 in the last 168 hours CBC:  Lab 09/17/11 1530 09/17/11 1206 09/16/11 1129  WBC 8.9 7.4 7.1  NEUTROABS -- -- --  HGB 7.3* 7.7* 7.1*  HCT 24.8* 26.4* 23.5*  MCV 80.5 81.2 79.9  PLT 347 PLATELET CLUMPS NOTED ON SMEAR, UNABLE TO ESTIMATE 373   Cardiac Enzymes:  Lab 09/17/11 0830 09/17/11 0116 09/16/11 1539 09/16/11 1129  CKTOTAL 98 121 154 172  CKMB 4.2* 5.0* 6.6* 6.9*  CKMBINDEX -- -- -- --  TROPONINI <0.30 <0.30 <0.30 <0.30   BNP:  Lab 09/16/11 1215  POCBNP 8569.0*    CBG:  Lab 09/18/11 0659 09/17/11 2046 09/17/11 1703 09/17/11 1110 09/17/11 0610  GLUCAP 155* 174* 138* 143* 129*   Microbiology: No results found for this basename: cult   No results found for this basename: CULT:2,SDES:2 in the last 168 hours  Imaging: Dg Chest Portable 1 View  09/16/2011  *RADIOLOGY REPORT*  Clinical Data: Shortness of breath.  PORTABLE CHEST - 1 VIEW  Comparison: 03/22/2010  Findings: There is cardiomegaly with vascular congestion. Bilateral interstitial prominence may reflect interstitial edema. Focal bibasilar atelectasis or infiltrates, left greater than right.  Question small bilateral effusions.  IMPRESSION: Cardiomegaly with interstitial edema.  Bibasilar atelectasis or infiltrates, left greater than right.  Small effusions.  Original Report Authenticated By: Cyndie Chime, M.D.     ASSESSMENT:  1. Acute systolic heart failure EF 30%  2. HTN  3. DM  4. Hyperlipidemia  5. Anemia, iron deficient 6. Hypokalemia (resolved) 7. Aortic stenosis   PLAN/DISCUSSION  Clinically improving from HF standpoint but hgb continues to drop. No obvious source. Will transfuse 2 units. Start iron. GI to see. If stable will plan diagnostic R & L heart cath tomorrow. Give lasix after transfusion.   Reuel Boom BensimhonMD 4:57 PM

## 2011-09-19 ENCOUNTER — Encounter (HOSPITAL_COMMUNITY): Payer: Self-pay | Admitting: Internal Medicine

## 2011-09-19 ENCOUNTER — Encounter (HOSPITAL_COMMUNITY)
Admission: EM | Disposition: A | Discharge status: Home / Self Care | Payer: Self-pay | Source: Home / Self Care | Attending: Cardiology

## 2011-09-19 DIAGNOSIS — I359 Nonrheumatic aortic valve disorder, unspecified: Secondary | ICD-10-CM

## 2011-09-19 HISTORY — PX: LEFT AND RIGHT HEART CATHETERIZATION WITH CORONARY ANGIOGRAM: SHX5449

## 2011-09-19 LAB — POCT I-STAT 3, VENOUS BLOOD GAS (G3P V)
Bicarbonate: 30.2 mEq/L — ABNORMAL HIGH (ref 20.0–24.0)
pCO2, Ven: 44.4 mmHg — ABNORMAL LOW (ref 45.0–50.0)
pH, Ven: 7.436 — ABNORMAL HIGH (ref 7.250–7.300)
pH, Ven: 7.444 — ABNORMAL HIGH (ref 7.250–7.300)
pO2, Ven: 30 mmHg (ref 30.0–45.0)

## 2011-09-19 LAB — CREATININE, SERUM
GFR calc Af Amer: >90 mL/min (ref >90)
GFR calc non Af Amer: 78 mL/min — ABNORMAL LOW (ref >90)

## 2011-09-19 LAB — GLUCOSE, CAPILLARY
Glucose-Capillary: 167 mg/dL — ABNORMAL HIGH (ref 70–99)
Glucose-Capillary: 142 mg/dL — ABNORMAL HIGH (ref 70–99)

## 2011-09-19 LAB — TYPE AND SCREEN
ABO/RH(D): A POS
Unit division: 0
Unit division: 0

## 2011-09-19 LAB — BASIC METABOLIC PANEL
CO2: 34 mEq/L — ABNORMAL HIGH (ref 19–32)
Calcium: 10.8 mg/dL — ABNORMAL HIGH (ref 8.4–10.5)
Chloride: 96 mEq/L (ref 96–112)
Creatinine, Ser: 0.86 mg/dL (ref 0.50–1.10)
Glucose, Bld: 190 mg/dL — ABNORMAL HIGH (ref 70–99)
Sodium: 138 mEq/L (ref 135–145)

## 2011-09-19 LAB — CBC
HCT: 30.8 % — ABNORMAL LOW (ref 36.0–46.0)
Hemoglobin: 9.7 g/dL — ABNORMAL LOW (ref 12.0–15.0)
MCHC: 31.5 g/dL (ref 30.0–36.0)
MCV: 80.4 fL (ref 78.0–100.0)
WBC: 8.9 10*3/uL (ref 4.0–10.5)

## 2011-09-19 LAB — POCT I-STAT 3, ART BLOOD GAS (G3+)
Bicarbonate: 31.5 mEq/L — ABNORMAL HIGH (ref 20.0–24.0)
pCO2 arterial: 40.4 mmHg (ref 35.0–45.0)
pH, Arterial: 7.5 — ABNORMAL HIGH (ref 7.350–7.400)
pO2, Arterial: 67 mmHg — ABNORMAL LOW (ref 80.0–100.0)

## 2011-09-19 LAB — HEMOGLOBIN AND HEMATOCRIT, BLOOD
HCT: 31.9 % — ABNORMAL LOW (ref 36.0–46.0)
Hemoglobin: 9.9 g/dL — ABNORMAL LOW (ref 12.0–15.0)

## 2011-09-19 SURGERY — LEFT AND RIGHT HEART CATHETERIZATION WITH CORONARY ANGIOGRAM
Anesthesia: LOCAL

## 2011-09-19 MED ORDER — ASPIRIN 81 MG PO CHEW
324.0000 mg | CHEWABLE_TABLET | ORAL | Status: AC
  Administered 2011-09-20: 324 mg via ORAL
  Filled 2011-09-19: qty 4

## 2011-09-19 MED ORDER — NITROGLYCERIN 0.2 MG/ML ON CALL CATH LAB
INTRAVENOUS | Status: AC
  Filled 2011-09-19: qty 1

## 2011-09-19 MED ORDER — FUROSEMIDE 40 MG PO TABS
40.0000 mg | ORAL_TABLET | Freq: Every day | ORAL | Status: DC
  Administered 2011-09-19 – 2011-09-21 (×3): 40 mg via ORAL
  Filled 2011-09-19 (×3): qty 1

## 2011-09-19 MED ORDER — METFORMIN HCL 500 MG PO TABS: 500.0000 mg | ORAL_TABLET | Freq: Two times a day (BID) | ORAL | Status: DC

## 2011-09-19 MED ORDER — SODIUM CHLORIDE 0.9 % IV SOLN
INTRAVENOUS | Status: AC
  Administered 2011-09-19: 15:00:00 via INTRAVENOUS

## 2011-09-19 MED ORDER — LIDOCAINE HCL (PF) 1 % IJ SOLN
INTRAMUSCULAR | Status: AC
  Filled 2011-09-19: qty 30

## 2011-09-19 MED ORDER — ACETAMINOPHEN 325 MG PO TABS: 650.0000 mg | ORAL_TABLET | ORAL | Status: DC | PRN

## 2011-09-19 MED ORDER — SODIUM CHLORIDE 0.9 % IV SOLN: INTRAVENOUS | Status: DC

## 2011-09-19 MED ORDER — HEPARIN SODIUM (PORCINE) 5000 UNIT/ML IJ SOLN
5000.0000 [IU] | Freq: Three times a day (TID) | INTRAMUSCULAR | Status: DC
  Administered 2011-09-19 – 2011-09-21 (×5): 5000 [IU] via SUBCUTANEOUS
  Filled 2011-09-19 (×8): qty 1

## 2011-09-19 MED ORDER — CARVEDILOL 6.25 MG PO TABS
6.2500 mg | ORAL_TABLET | Freq: Two times a day (BID) | ORAL | Status: DC
  Administered 2011-09-19 – 2011-09-21 (×3): 6.25 mg via ORAL
  Filled 2011-09-19 (×6): qty 1

## 2011-09-19 MED ORDER — CARVEDILOL 3.125 MG PO TABS
3.1250 mg | ORAL_TABLET | Freq: Once | ORAL | Status: AC
  Administered 2011-09-19: 3.125 mg via ORAL
  Filled 2011-09-19: qty 1

## 2011-09-19 MED ORDER — FUROSEMIDE 10 MG/ML IJ SOLN
20.0000 mg | Freq: Once | INTRAMUSCULAR | Status: AC
  Administered 2011-09-19: 20 mg via INTRAVENOUS
  Filled 2011-09-19: qty 2

## 2011-09-19 MED ORDER — HEPARIN (PORCINE) IN NACL 2-0.9 UNIT/ML-% IJ SOLN
INTRAMUSCULAR | Status: AC
  Filled 2011-09-19: qty 2000

## 2011-09-19 MED ORDER — ONDANSETRON HCL 4 MG/2ML IJ SOLN: 4.0000 mg | Freq: Four times a day (QID) | INTRAMUSCULAR | Status: DC | PRN

## 2011-09-19 NOTE — Interval H&P Note (Signed)
History and Physical Interval Note:  09/19/2011 1:05 PM  Joanna [name]  has presented today for surgery, with the diagnosis of aortic stenosis and heart failure. The various methods of treatment have been discussed with the patient and family. After consideration of risks, benefits and other options for treatment, the patient has consented to  Procedure(s): LEFT AND RIGHT HEART CATHETERIZATION WITH CORONARY ANGIOGRAM as a surgical intervention .  The patients' history has been reviewed, patient examined, no change in status, stable for surgery.  I have reviewed the patients' chart and labs.  Questions were answered to the patient's satisfaction.     Daniel Bensimhon

## 2011-09-19 NOTE — Progress Notes (Signed)
COURTESY NOTE Patient admitted with CHF - has had a good diuresis. For cath today per note. Also with iron deficiency anemia.   MAde visit with patient - offered encouragement.  Thanks for the great care.

## 2011-09-19 NOTE — Progress Notes (Signed)
Subjective:   Patient is a 77 year old white female with mild to moderate aortic stenosis by echo in 09/2010 and a normal cardiac catheterization in 2008, with a past medical history of hypertension, diabetes mellitus type II, hyperlipidemia, and peripheral artery disease . Admitted with acute systolic HF (new onset)  Received 2UPRBC yesterday due to decrease HGB 6.8. HGB post tranfusion 9.9. GI consult appreciated with recommendations for ferrous sulfate and outpatient follow up primary care physician.   11/28 ECHO with contrast - EF 25-30% No LV thrombus    Breathing improved with diuresis.Weight stable. Feels better. Denies SOB/CP/PND/Orthopnea. For cath today.     Intake/Output Summary (Last 24 hours) at 09/19/11 0946 Last data filed at 09/19/11 0454  Gross per 24 hour  Intake   2140 ml  Output   2101 ml  Net     39 ml    Current meds:    . calcium-vitamin D  1 tablet Oral BID  . carvedilol  3.125 mg Oral BID WC  . cholecalciferol  2,000 Units Oral Daily  . diltiazem  180 mg Oral Daily  . docusate sodium  100 mg Oral BID  . ferrous sulfate  325 mg Oral BID WC  . furosemide  20 mg Intravenous Once  . furosemide  40 mg Oral Daily  . influenza  inactive virus vaccine  0.5 mL Intramuscular Tomorrow-1000  . insulin aspart  0-9 Units Subcutaneous TID WC  . losartan  100 mg Oral Daily  . metFORMIN  1,000 mg Oral BID WC  . omega-3 acid ethyl esters  1 g Oral TID  . potassium chloride SA  20 mEq Oral BID  . rosuvastatin  20 mg Oral Daily  . sodium chloride  3 mL Intravenous Q12H  . spironolactone  12.5 mg Oral Daily  . DISCONTD: furosemide  40 mg Oral BID  . DISCONTD: sodium chloride  3 mL Intravenous Q12H   Infusions:    . sodium chloride 10 mL/hr at 09/19/11 0011     Objective:  Blood pressure 122/67, pulse 94, temperature 99.6 F (37.6 C), temperature source Oral, resp. rate 16, height 5\' 3"  (1.6 m), weight 66.815 kg (147 lb 4.8 oz), SpO2 93.00%. Weight change:  -0.185 kg (-6.5 oz)  Physical Exam: General:  Well appearing. No resp difficulty HEENT: normal Neck: supple. JVP 6-7 Carotids 2+ bilat; no bruits. No lymphadenopathy or thryomegaly appreciated. Cor: PMI nondisplaced. Regular rate & rhythm. No rubs, gallops or murmurs. Lungs: clear Abdomen: soft, nontender, nondistended. No hepatosplenomegaly. No bruits or masses. Good bowel sounds. Extremities: no cyanosis, clubbing, rash, trace edema Neuro: alert & orientedx3, cranial nerves grossly intact. moves all 4 extremities w/o difficulty. Affect pleasant  Telemetry: SR BBB  Lab Results: Basic Metabolic Panel:  Lab 09/19/11 0981 09/18/11 0649 09/17/11 1530 09/17/11 0130 09/16/11 1129  NA 138 136 139 136 136  K 3.5 3.9 -- -- --  CL 96 97 95* 97 99  CO2 34* 32 34* 30 27  GLUCOSE 190* 146* 149* 138* 152*  BUN 29* 31* 22 23 25*  CREATININE 0.86 1.03 0.99 0.95 0.82  CALCIUM 10.8* 10.7* 11.1* 10.3 10.9*  MG -- -- -- -- --  PHOS -- -- -- -- --   Liver Function Tests:  Lab 09/16/11 1539  AST 27  ALT 22  ALKPHOS 33*  BILITOT 0.3  PROT 5.8*  ALBUMIN 3.4*   No results found for this basename: LIPASE:5,AMYLASE:5 in the last 168 hours No results found for  this basename: AMMONIA:5 in the last 168 hours CBC:  Lab 09/19/11 0605 09/18/11 0900 09/17/11 1530 09/17/11 1206 09/16/11 1129  WBC -- 7.4 8.9 7.4 7.1  NEUTROABS -- -- -- -- --  HGB 9.9* 6.8* 7.3* 7.7* 7.1*  HCT 31.9* 22.6* 24.8* 26.4* 23.5*  MCV -- 80.1 80.5 81.2 79.9  PLT -- 308 347 PLATELET CLUMPS NOTED ON SMEAR, UNABLE TO ESTIMATE 373   Cardiac Enzymes:  Lab 09/17/11 0830 09/17/11 0116 09/16/11 1539 09/16/11 1129  CKTOTAL 98 121 154 172  CKMB 4.2* 5.0* 6.6* 6.9*  CKMBINDEX -- -- -- --  TROPONINI <0.30 <0.30 <0.30 <0.30   BNP:  Lab 09/16/11 1215  POCBNP 8569.0*   CBG:  Lab 09/19/11 0631 09/18/11 2149 09/18/11 1606 09/18/11 1058 09/18/11 0659  GLUCAP 167* 195* 221* 128* 155*   Microbiology: No results found for this  basename: cult   No results found for this basename: CULT:2,SDES:2 in the last 168 hours  Imaging: No results found.   ASSESSMENT:  1. Acute systolic heart failure EF 30%  2. HTN  3. DM  4. Hyperlipidemia  5. Anemia, iron deficient - Hgb nadir 6.6 s/p 2u RBC      --colonoscopies neg 2006 & 2009.      --GI feels possibly due to poor dietary intake vs AVMs 6. Hypokalemia (resolved)  7. Aortic stenosis . moderate to severe by echo (peak gradient - no mean reported). AVA 1.0cm2    PLAN/DISCUSSION: Volume status improving  Feels better after transfusion of RBCs yesterday. GI input appreciated. For R and L heart cath today for further hemodynamics. Lasix 40 mg po daily. Titrate carvedilol. Add digoxin.    LOS: 3 days    Arvilla Meres, MD 09/19/2011, 9:46 AM

## 2011-09-19 NOTE — Op Note (Addendum)
Cardiac Cath Procedure Note  Indication: Aortic stenosis and heart failure  Procedures performed:  1) Right heart cathererization 2) Selective coronary angiography 3) Left heart catheterization   Description of procedure:     The risks and indication of the procedure were explained. Consent was signed and placed on the chart. An appropriate timeout was taken prior to the procedure. The right groin was prepped and draped in the routine sterile fashion and anesthetized with 1% local lidocaine.   A 6 FR arterial sheath was placed in the right femoral artery using a modified Seldinger technique. Standard catheters including a JL4, JR4 were used. All catheter exchanges were made over a wire. A 7 FR venous sheath was placed in the right femoral vein using a modified Seldinger technique. A standard Swan-Ganz catheter was used for the procedure. The aortic valve was crossed very easily using a 5FR JR4 and long straight wire. Simultaneous LV and FA pressures were obtained.  Complications:  None apparent  Findings:  RA = 8 RV = 58/2/11 PA =  54/22 (37) PCW = 20 Fick cardiac output/index = 4.57/2.67 PVR = 3.7 Woods FA sat = 95% PA sat = 58%, 61%  Ao Pressure: 119/55 (82)  LV Pressure:  140/10/22  AV gradient: Max 21 mean 12 mmHG  AVA: 1.62cm2   Left main: Large normal  LAD:  Gave off 2 diagonals. 2nd diagonal very large. (larged than LAD). Mid to distal LAD small with mild 30% plaquing  LCX: small. Angiographically normal  RCA: Dominant. Very large. Minimal plaque in the midsection.    Assessment: 1. Minimal non-obstructive CAD 2. CHF with mildly elevated filling pressures and preserved cardiac output. EF ~ 30% by echo 3. Mild pulmonary HTN 4. Mild aortic stenosis  Plan/Discussion:  Aortic stenosis is mild. CHF relatively well compensated. Medical treatment for HF including titration of ACE-I and b-blocker as tolerated.   Arvilla Meres, MD 1:43 PM

## 2011-09-19 NOTE — Progress Notes (Signed)
Inpatient Diabetes Program Recommendations  AACE/ADA: New Consensus Statement on Inpatient Glycemic Control (2009)  Target Ranges:  Prepandial:   less than 140 mg/dL      Peak postprandial:   less than 180 mg/dL (1-2 hours)      Critically ill patients:  140 - 180 mg/dL   Reason for Visit: Hyperglycemia  Inpatient Diabetes Program Recommendations Insulin - Basal: may benefit from adding Lantus 5 units QHS.

## 2011-09-19 NOTE — H&P (View-Only) (Signed)
Subjective:   Patient is a 77 year old white female with mild to moderate aortic stenosis by echo in 09/2010 and a normal cardiac catheterization in 2008, with a past medical history of hypertension, diabetes mellitus type II, hyperlipidemia, and peripheral artery disease . Admitted with acute systolic HF (new onset)  Received 2UPRBC yesterday due to decrease HGB 6.8. HGB post tranfusion 9.9. GI consult appreciated with recommendations for ferrous sulfate and outpatient follow up primary care physician.   11/28 ECHO with contrast - EF 25-30% No LV thrombus    Breathing improved with diuresis.Weight stable. Feels better. Denies SOB/CP/PND/Orthopnea. For cath today.     Intake/Output Summary (Last 24 hours) at 09/19/11 0946 Last data filed at 09/19/11 0829  Gross per 24 hour  Intake   2140 ml  Output   2101 ml  Net     39 ml    Current meds:    . calcium-vitamin D  1 tablet Oral BID  . carvedilol  3.125 mg Oral BID WC  . cholecalciferol  2,000 Units Oral Daily  . diltiazem  180 mg Oral Daily  . docusate sodium  100 mg Oral BID  . ferrous sulfate  325 mg Oral BID WC  . furosemide  20 mg Intravenous Once  . furosemide  40 mg Oral Daily  . influenza  inactive virus vaccine  0.5 mL Intramuscular Tomorrow-1000  . insulin aspart  0-9 Units Subcutaneous TID WC  . losartan  100 mg Oral Daily  . metFORMIN  1,000 mg Oral BID WC  . omega-3 acid ethyl esters  1 g Oral TID  . potassium chloride SA  20 mEq Oral BID  . rosuvastatin  20 mg Oral Daily  . sodium chloride  3 mL Intravenous Q12H  . spironolactone  12.5 mg Oral Daily  . DISCONTD: furosemide  40 mg Oral BID  . DISCONTD: sodium chloride  3 mL Intravenous Q12H   Infusions:    . sodium chloride 10 mL/hr at 09/19/11 0011     Objective:  Blood pressure 122/67, pulse 94, temperature 99.6 F (37.6 C), temperature source Oral, resp. rate 16, height 5' 3" (1.6 m), weight 66.815 kg (147 lb 4.8 oz), SpO2 93.00%. Weight change:  -0.185 kg (-6.5 oz)  Physical Exam: General:  Well appearing. No resp difficulty HEENT: normal Neck: supple. JVP 6-7 Carotids 2+ bilat; no bruits. No lymphadenopathy or thryomegaly appreciated. Cor: PMI nondisplaced. Regular rate & rhythm. No rubs, gallops or murmurs. Lungs: clear Abdomen: soft, nontender, nondistended. No hepatosplenomegaly. No bruits or masses. Good bowel sounds. Extremities: no cyanosis, clubbing, rash, trace edema Neuro: alert & orientedx3, cranial nerves grossly intact. moves all 4 extremities w/o difficulty. Affect pleasant  Telemetry: SR BBB  Lab Results: Basic Metabolic Panel:  Lab 09/19/11 0605 09/18/11 0649 09/17/11 1530 09/17/11 0130 09/16/11 1129  NA 138 136 139 136 136  K 3.5 3.9 -- -- --  CL 96 97 95* 97 99  CO2 34* 32 34* 30 27  GLUCOSE 190* 146* 149* 138* 152*  BUN 29* 31* 22 23 25*  CREATININE 0.86 1.03 0.99 0.95 0.82  CALCIUM 10.8* 10.7* 11.1* 10.3 10.9*  MG -- -- -- -- --  PHOS -- -- -- -- --   Liver Function Tests:  Lab 09/16/11 1539  AST 27  ALT 22  ALKPHOS 33*  BILITOT 0.3  PROT 5.8*  ALBUMIN 3.4*   No results found for this basename: LIPASE:5,AMYLASE:5 in the last 168 hours No results found for   this basename: AMMONIA:5 in the last 168 hours CBC:  Lab 09/19/11 0605 09/18/11 0900 09/17/11 1530 09/17/11 1206 09/16/11 1129  WBC -- 7.4 8.9 7.4 7.1  NEUTROABS -- -- -- -- --  HGB 9.9* 6.8* 7.3* 7.7* 7.1*  HCT 31.9* 22.6* 24.8* 26.4* 23.5*  MCV -- 80.1 80.5 81.2 79.9  PLT -- 308 347 PLATELET CLUMPS NOTED ON SMEAR, UNABLE TO ESTIMATE 373   Cardiac Enzymes:  Lab 09/17/11 0830 09/17/11 0116 09/16/11 1539 09/16/11 1129  CKTOTAL 98 121 154 172  CKMB 4.2* 5.0* 6.6* 6.9*  CKMBINDEX -- -- -- --  TROPONINI <0.30 <0.30 <0.30 <0.30   BNP:  Lab 09/16/11 1215  POCBNP 8569.0*   CBG:  Lab 09/19/11 0631 09/18/11 2149 09/18/11 1606 09/18/11 1058 09/18/11 0659  GLUCAP 167* 195* 221* 128* 155*   Microbiology: No results found for this  basename: cult   No results found for this basename: CULT:2,SDES:2 in the last 168 hours  Imaging: No results found.   ASSESSMENT:  1. Acute systolic heart failure EF 30%  2. HTN  3. DM  4. Hyperlipidemia  5. Anemia, iron deficient - Hgb nadir 6.6 s/p 2u RBC      --colonoscopies neg 2006 & 2009.      --GI feels possibly due to poor dietary intake vs AVMs 6. Hypokalemia (resolved)  7. Aortic stenosis . moderate to severe by echo (peak gradient 35mmHG - no mean reported). AVA 1.0cm2    PLAN/DISCUSSION: Volume status improving  Feels better after transfusion of RBCs yesterday. GI input appreciated. For R and L heart cath today for further hemodynamics. Lasix 40 mg po daily. Titrate carvedilol. Add digoxin.    LOS: 3 days    Olusegun Gerstenberger, MD 09/19/2011, 9:46 AM  

## 2011-09-19 NOTE — Progress Notes (Signed)
09/19/11 1840 Nursing Note: Pt's right groin site level 0. Pt does not complain of discomfort. No swelling, bleeding or hematoma present. Distal Pulse +2 at baseline.  Will continue to monitor site.

## 2011-09-20 DIAGNOSIS — I5023 Acute on chronic systolic (congestive) heart failure: Secondary | ICD-10-CM

## 2011-09-20 LAB — GLUCOSE, CAPILLARY
Glucose-Capillary: 175 mg/dL — ABNORMAL HIGH (ref 70–99)
Glucose-Capillary: 180 mg/dL — ABNORMAL HIGH (ref 70–99)
Glucose-Capillary: 166 mg/dL — ABNORMAL HIGH (ref 70–99)

## 2011-09-20 LAB — BASIC METABOLIC PANEL
Calcium: 9.9 mg/dL (ref 8.4–10.5)
GFR calc Af Amer: >90 mL/min (ref >90)
GFR calc non Af Amer: 79 mL/min — ABNORMAL LOW (ref >90)
Potassium: 3.2 mEq/L — ABNORMAL LOW (ref 3.5–5.1)
Sodium: 138 mEq/L (ref 135–145)

## 2011-09-20 MED ORDER — LOSARTAN POTASSIUM 50 MG PO TABS
100.0000 mg | ORAL_TABLET | Freq: Every day | ORAL | Status: DC
  Administered 2011-09-21: 100 mg via ORAL
  Filled 2011-09-20: qty 2

## 2011-09-20 MED ORDER — POTASSIUM CHLORIDE CRYS ER 20 MEQ PO TBCR
40.0000 meq | EXTENDED_RELEASE_TABLET | Freq: Once | ORAL | Status: AC
  Administered 2011-09-20: 40 meq via ORAL
  Filled 2011-09-20: qty 2

## 2011-09-20 MED ORDER — POTASSIUM CHLORIDE CRYS ER 20 MEQ PO TBCR
20.0000 meq | EXTENDED_RELEASE_TABLET | Freq: Every day | ORAL | Status: DC
  Administered 2011-09-21: 20 meq via ORAL
  Filled 2011-09-20: qty 1

## 2011-09-20 NOTE — Progress Notes (Signed)
Dr. Truitt Merle about Joanna [name] BP. Joanna [name] is asymptomatic.  Morning medications believed to be the cause of low BP. Will monitor BP until WDL and notify dr if BP drops anymore.

## 2011-09-20 NOTE — Progress Notes (Signed)
Patient ID: Joanna [name], female   DOB: Mar 28, 1930, 77 y.o.   MRN: [id number] Subjective:  Dyspnea improved. Feeling better.  Objective:  Vital Signs in the last 24 hours: Temp:  [98.2 F (36.8 C)-98.4 F (36.9 C)] 98.3 F (36.8 C) (12/01 0704) Pulse Rate:  [79-91] 79  (12/01 0704) Resp:  [16-18] 18  (12/01 0704) BP: (109-123)/(58-68) 109/58 mmHg (12/01 0704) SpO2:  [93 %-97 %] 93 % (12/01 0704) Weight:  [64.955 kg (143 lb 3.2 oz)] 143 lb 3.2 oz (64.955 kg) (12/01 0704)  Intake/Output from previous day: 11/30 0701 - 12/01 0700 In: 1040 [P.O.:840; I.V.:200] Out: 1776 [Urine:1775; Stool:1] Intake/Output from this shift: Total I/O In: 243 [P.O.:240; I.V.:3] Out: 1320 [Urine:1320]  Physical Exam: elderly appearing NAD HEENT: Unremarkable Neck:  7 cm JVD, no thyromegally Lungs:  Clear with minimal basilar rales. HEART:  Regular rate rhythm, 2/6 systolic murmurs, no rubs, no clicks Abd:  Flat, positive bowel sounds, no organomegally, no rebound, no guarding Ext:  2 plus pulses, no edema, no cyanosis, no clubbing Skin:  No rashes no nodules Neuro:  CN II through XII intact, motor grossly intact  Lab Results:  Basename 09/19/11 1549 09/19/11 0605 09/18/11 0900  WBC 8.9 -- 7.4  HGB 9.7* 9.9* --  PLT 309 -- 308    Basename 09/19/11 1549 09/19/11 0605 09/18/11 0649  NA -- 138 136  K -- 3.5 3.9  CL -- 96 97  CO2 -- 34* 32  GLUCOSE -- 190* 146*  BUN -- 29* 31*  CREATININE 0.75 0.86 --   No results found for this basename: TROPONINI:2,CK,MB:2 in the last 72 hours Hepatic Function Panel No results found for this basename: PROT,ALBUMIN,AST,ALT,ALKPHOS,BILITOT,BILIDIR,IBILI in the last 72 hours No results found for this basename: CHOL in the last 72 hours No results found for this basename: PROTIME in the last 72 hours  Imaging: No results found.  Cardiac Studies: Right and left heart cath results reviewed. Assessment/Plan:   1. Acute on chronic systolic heart failure  - continue diuresis. Hopefully discharge in next 24-48 hours. Continue current meds.  2. HTN - blood pressure is well controlled.   LOS: 4 days    Lewayne Bunting 09/20/2011, 11:02 AM

## 2011-09-20 NOTE — Progress Notes (Signed)
Pts bp is 78/36. Answering service for Dr. Debby Bud was notified.

## 2011-09-20 NOTE — Progress Notes (Signed)
COURTESY NOTE  Reviewed Cath - EF 30%, no obstructive disease  She is feeling much better, reports no difficulty breathing.  I have asked her to call for an appointment the ifrst week in January, sooner as needed.  THANKS

## 2011-09-21 DIAGNOSIS — D509 Iron deficiency anemia, unspecified: Secondary | ICD-10-CM

## 2011-09-21 DIAGNOSIS — I350 Nonrheumatic aortic (valve) stenosis: Secondary | ICD-10-CM

## 2011-09-21 DIAGNOSIS — I509 Heart failure, unspecified: Secondary | ICD-10-CM

## 2011-09-21 HISTORY — DX: Iron deficiency anemia, unspecified: D50.9

## 2011-09-21 LAB — BASIC METABOLIC PANEL
Chloride: 95 mEq/L — ABNORMAL LOW (ref 96–112)
Creatinine, Ser: 0.88 mg/dL (ref 0.50–1.10)
GFR calc Af Amer: 71 mL/min — ABNORMAL LOW (ref >90)
GFR calc non Af Amer: 61 mL/min — ABNORMAL LOW (ref >90)
Glucose, Bld: 158 mg/dL — ABNORMAL HIGH (ref 70–99)
Potassium: 3.3 mEq/L — ABNORMAL LOW (ref 3.5–5.1)

## 2011-09-21 LAB — GLUCOSE, CAPILLARY
Glucose-Capillary: 148 mg/dL — ABNORMAL HIGH (ref 70–99)
Glucose-Capillary: 178 mg/dL — ABNORMAL HIGH (ref 70–99)

## 2011-09-21 MED ORDER — FERROUS SULFATE 325 (65 FE) MG PO TABS: 325.0000 mg | ORAL_TABLET | Freq: Two times a day (BID) | ORAL | Status: DC

## 2011-09-21 MED ORDER — LOSARTAN POTASSIUM 100 MG PO TABS: 100.0000 mg | ORAL_TABLET | Freq: Every day | ORAL | Status: DC

## 2011-09-21 MED ORDER — POTASSIUM CHLORIDE CRYS ER 20 MEQ PO TBCR
40.0000 meq | EXTENDED_RELEASE_TABLET | Freq: Once | ORAL | Status: AC
  Administered 2011-09-21: 40 meq via ORAL
  Filled 2011-09-21: qty 2

## 2011-09-21 MED ORDER — SPIRONOLACTONE 12.5 MG HALF TABLET: 12.5000 mg | ORAL_TABLET | Freq: Every day | ORAL | Status: DC

## 2011-09-21 MED ORDER — CARVEDILOL 6.25 MG PO TABS: 6.2500 mg | ORAL_TABLET | Freq: Two times a day (BID) | ORAL | Status: DC

## 2011-09-21 MED ORDER — DSS 100 MG PO CAPS: 100.0000 mg | ORAL_CAPSULE | Freq: Two times a day (BID) | ORAL | Status: AC

## 2011-09-21 MED ORDER — FUROSEMIDE 40 MG PO TABS: 40.0000 mg | ORAL_TABLET | Freq: Every day | ORAL | Status: DC

## 2011-09-21 NOTE — Discharge Summary (Addendum)
Discharge Summary   Patient ID: Joanna [name],  MRN: [id number], DOB/AGE: 1930/05/25 77 y.o.  Admit date: 09/16/2011 Discharge date: 09/21/2011  Discharge Diagnoses Principal Problem:  *CHF (congestive heart failure) Active Problems:  DM  HYPERLIPIDEMIA  HYPERTENSION  Iron deficiency anemia  Aortic stenosis  Allergies Allergies  Allergen Reactions  . Other Hives    STEROIDS  . Prednisone Hives and Other (See Comments)    She is allergic to all steroids!    Procedures:  Right heart catheterization, selective coronary angiography, left heart catheterization  Findings:  RA = 8  RV = 58/2/11  PA = 54/22 (37)  PCW = 20  Fick cardiac output/index = 4.57/2.67  PVR = 3.7 Woods  FA sat = 95%  PA sat = 58%, 61%  Ao Pressure: 119/55 (82)  LV Pressure: 140/10/22  AV gradient: Max 21 mean 12 mmHG  AVA: 1.62cm2  Left main: Large normal  LAD: Gave off 2 diagonals. 2nd diagonal very large. (larged than LAD). Mid to distal LAD small with mild 30% plaquing  LCX: small. Angiographically normal  RCA: Dominant. Very large. Minimal plaque in the midsection.   Assessment:  1. Minimal non-obstructive CAD  2. CHF with mildly elevated filling pressures and preserved cardiac output. EF ~ 30% by echo  3. Mild pulmonary HTN  4. Mild aortic stenosis  2D echocardiogram with contrast  Study Conclusions  - Left ventricle: Systolic function was severely reduced. The estimated ejection fraction was in the range of 25% to 30%. - Impressions: Limited 2-D contrast echo to evaluate for LV thrombus. LV dilated EF 25-30% no LV thrombus appreciated.  Impressions:  - Limited 2-D contrast echo to evaluate for LV thrombus. LV dilated EF 25-30% no LV thrombus appreciated.  History of Present Illness: Joanna [name] is a 77 yo female with PMHx significant for hypertension, type 2 diabetes mellitus, hyperlipidemia, peripheral artery disease no previous cardiac history (normal 2-D echo and 09/2010 ,  normal cardiac catheterization 2008) was admitted to [facility] with acute systolic heart failure.  Hospital Course   She presented to [facility] with worsening shortness of breath, PND, orthopnea and lower extremity edema x3 days. She denied diet or medication changes or recent illness. She was found to be volume overloaded in the ED, without chest pain, fever, chills, nausea or vomiting. Chest x-ray revealed cardiomegaly with interstitial edema, bibasilar atelectasis versus infiltrates and small effusions. BNP elevated at 8569.0. She was subsequently admitted for suspected acute systolic heart failure.   2-D echocardiogram order revealed LVEF of 25-30%, moderate LVH, mild LV dilation, diffuse LV hypokinesis, moderate aortic valve stenosis moderate MVR and LAE. This had changed from echo done 2011 revealing LVEF of 55-60%, and only mild aortic stenosis.  She was started on Lasix IV, diuresed well and was scheduled for cardiac catheterization to assess further hemodynamics and to determine an ischemic source of her worsening heart failure. She was informed, consented and agreed to the procedure which elicited the above findings revealing minimal nonobstructive CAD, mild pulmonary hypertension, mild aortic stenosis and confirmed EF. Plan was for medical management including ACE inhibitor/ARB and beta blocker.  She was also found to be anemic with hemoglobin less than 7. Hemoccult was negative and GI was consulted. Previous colonoscopies are reviewed and suspicion for active bleeding was low. She was diagnosed with chronic iron deficiency anemia and transfused 2 pRBCs and started on iron with moderate improvement in her hemoglobin.  She continued to diuresis well after transitioning  to Lasix PO with her symptoms improving- dyspnea improved and edema resolved. She feels that she is at her baseline today and will be discharged. Medication adjustments and changes have been made during this admission and  will be continued on discharge. She will follow up soon in the heart failure clinic with Dr. Gala Romney and with her PCP Dr. Debby Bud to follow chronic iron deficiency anemia.   Discharge Vitals:  Blood pressure 104/63, pulse 89, temperature 99.1 F (37.3 C), temperature source Oral, resp. rate 16, height 5\' 3"  (1.6 m), weight 66.044 kg (145 lb 9.6 oz), SpO2 91.00%.   Weight change:   On admission 69.6 kg 12/1: 64.9kg Net: -5.4 kg or  I/O net since admission: -5.6965 L  Labs: Recent Labs  [facility] 09/19/11 1549 09/19/11 0605   WBC 8.9 --   HGB 9.7* 9.9*   HCT 30.8* 31.9*   MCV 80.4 --   PLT 309 --    Lab 09/21/11 0500 09/20/11 1100 09/19/11 1549 09/19/11 0605 09/16/11 1539  NA 135 138 -- 138 --  K 3.3* 3.2* -- 3.5 --  CL 95* 94* -- 96 --  CO2 35* 34* -- 34* --  BUN 24* 20 -- 29* --  CREATININE 0.88 0.74 0.75 -- --  CALCIUM 10.0 9.9 -- 10.8* --  PROT -- -- -- -- 5.8*  BILITOT -- -- -- -- 0.3  ALKPHOS -- -- -- -- 33*  ALT -- -- -- -- 22  AST -- -- -- -- 27  AMYLASE -- -- -- -- --  LIPASE -- -- -- -- --  GLUCOSE 158* 195* -- 190* --   Results for Joanna [name], Joanna [name] (MRN [id number]) as of 09/21/2011 14:02  Ref. Range 09/16/2011 12:15  BNP, POC Latest Range: 0-450 pg/mL 8569.0 ([name])   Results for Joanna [name], Joanna [name] (MRN [id number]) as of 09/21/2011 14:02  Ref. Range 09/16/2011 11:29 09/16/2011 15:39 09/17/2011 01:16 09/17/2011 08:30  CK, MB Latest Range: 0.3-4.0 ng/mL 6.9 (HH) 6.6 (HH) 5.0 ([name]) 4.2 ([name])  CK Total Latest Range: 7-177 U/L 172 154 121 98  Troponin I Latest Range: <0.30 ng/mL <0.30 <0.30 <0.30 <0.30   Disposition:  Discharge Orders    Future Appointments: Provider: Department: Dept Phone: Center:   09/26/2011 9:15 AM [facility] [phone] None   11/12/2011 1:30 PM Duke Salvia, MD [facility] [phone] [facility]     Follow-up Information    Follow up with Arvilla Meres, MD on 09/26/2011. (12/7 at 9:15 [facility] [address] [phone])    Contact information:   [address] [place] [place] [address] [phone]       Follow up with Illene Regulus, MD. (Please call first week of January or sooner if needed.  PCP will follow monthly blood work for iron deficiency anemia.)    Contact information:   [address] [facility] [address] [place] [address] [place] [address] [phone]          Discharge Medications:  Current Discharge Medication List    START taking these medications   Details  carvedilol (COREG) 6.25 MG tablet Take 1 tablet (6.25 mg total) by mouth 2 (two) times daily with a meal. Qty: 60 tablet, Refills: 3    docusate sodium 100 MG CAPS Take 100 mg by mouth 2 (two) times daily. Qty: 10 capsule, Refills: 3    ferrous sulfate 325 (65 FE) MG tablet Take 1 tablet (325 mg total) by mouth  2 (two) times daily with a meal. Qty: 60 tablet, Refills: 3    furosemide (LASIX) 40 MG tablet Take 1 tablet (40 mg total) by mouth daily. Qty: 30 tablet, Refills: 3    losartan (COZAAR) 100 MG tablet Take 1 tablet (100 mg total) by mouth daily. Qty: 30 tablet, Refills: 3    spironolactone (ALDACTONE) 12.5 mg TABS Take 0.5 tablets (12.5 mg total) by mouth daily. Qty: 30 tablet, Refills: 3      CONTINUE these medications which have NOT CHANGED   Details  aspirin EC 81 MG tablet Take 81 mg by mouth daily.      calcium citrate-vitamin D (CITRACAL+D) 315-200 MG-UNIT per tablet Take 1-2 tablets by mouth 2 (two) times daily.     cholecalciferol (VITAMIN D) 1000 UNITS tablet Take 2,000 Units by mouth daily.      fish oil-omega-3 fatty acids 1000 MG capsule Take 1 g by mouth 3 (three) times daily.      Garlic 1000 MG CAPS Take 1 capsule by mouth daily.      metFORMIN (GLUCOPHAGE) 1000 MG tablet Take 500 mg by mouth 2 (two) times daily with a meal.      pioglitazone (ACTOS) 45 MG tablet Take 45 mg by mouth daily.      potassium chloride SA (K-DUR,KLOR-CON) 20 MEQ  tablet Take 20 mEq by mouth daily.      simvastatin (ZOCOR) 80 MG tablet Take 80 mg by mouth daily.        STOP taking these medications     diltiazem (CARDIZEM CD) 180 MG 24 hr capsule Comments:  Reason for Stopping:       losartan-hydrochlorothiazide (HYZAAR) 100-12.5 MG per tablet Comments:  Reason for Stopping:          Outstanding Labs/Studies: Pt's potassium was low at 3.3 this morning. She was given potassium 40 mEq. Will resume outpatient potassium supplementation.  Duration of Discharge Encounter: 40 minutes including physician time.  Signed, Hurman Horn, PA-C 09/21/2011, 1:29 PM    Agree Lewayne Bunting, M.D.

## 2011-09-21 NOTE — Progress Notes (Signed)
Patient ID: Joanna [name], female   DOB: 1930-01-26, 77 y.o.   MRN: [id number] Subjective:  Dyspnea improved. Feeling better. Thinks she is at baseline.  Objective:  Vital Signs in the last 24 hours: Temp:  [98.3 F (36.8 C)-99.1 F (37.3 C)] 99.1 F (37.3 C) (12/02 0300) Pulse Rate:  [71-94] 89  (12/02 0710) Resp:  [14-18] 16  (12/02 0300) BP: (74-130)/(36-75) 104/63 mmHg (12/02 0710) SpO2:  [91 %-96 %] 91 % (12/02 0300) Weight:  [66.044 kg (145 lb 9.6 oz)] 145 lb 9.6 oz (66.044 kg) (12/02 0300)  Intake/Output from previous day: 12/01 0701 - 12/02 0700 In: 1123 [P.O.:1120; I.V.:3] Out: 2520 [Urine:2520] Intake/Output from this shift: Total I/O In: 240 [P.O.:240] Out: -   Physical Exam: elderly appearing NAD HEENT: Unremarkable Neck:  7 cm JVD, no thyromegally Lungs:  Clear with minimal basilar rales. HEART:  Regular rate rhythm, 1/6 systolic murmur, no rubs, no clicks Abd:  Flat, positive bowel sounds, no organomegally, no rebound, no guarding Ext:  2 plus pulses, no edema, no cyanosis, no clubbing Skin:  No rashes no nodules Neuro:  CN II through XII intact, motor grossly intact  Lab Results:  Basename 09/19/11 1549 09/19/11 0605  WBC 8.9 --  HGB 9.7* 9.9*  PLT 309 --    Basename 09/21/11 0500 09/20/11 1100  NA 135 138  K 3.3* 3.2*  CL 95* 94*  CO2 35* 34*  GLUCOSE 158* 195*  BUN 24* 20  CREATININE 0.88 0.74   No results found for this basename: TROPONINI:2,CK,MB:2 in the last 72 hours Hepatic Function Panel No results found for this basename: PROT,ALBUMIN,AST,ALT,ALKPHOS,BILITOT,BILIDIR,IBILI in the last 72 hours No results found for this basename: CHOL in the last 72 hours No results found for this basename: PROTIME in the last 72 hours  Imaging: No results found.  Cardiac Studies: Right and left heart cath results reviewed. Tele - NSR Assessment/Plan:   1. Acute on chronic systolic heart failure - continue diuresis. Hopefully discharge later  today. Continue current meds. She will need early followup.  2. HTN - blood pressure is well controlled.   3. Hypokalemia - will replete prior to discharge.  LOS: 5 days    Lewayne Bunting 09/21/2011, 9:49 AM

## 2011-09-21 NOTE — Discharge Summary (Deleted)
Discharge Summary   Patient ID: Joanna [name],  MRN: [id number], DOB/AGE: 1930/04/24 77 y.o.  Admit date: 09/16/2011 Discharge date: 09/21/2011  Discharge Diagnoses Principal Problem:  *CHF (congestive heart failure) Active Problems:  DM  HYPERLIPIDEMIA  HYPERTENSION  Iron deficiency anemia  Aortic stenosis  Allergies Allergies  Allergen Reactions  . Other Hives    STEROIDS  . Prednisone Hives and Other (See Comments)    She is allergic to all steroids!    Procedures:  Right heart catheterization, selective coronary angiography, left heart catheterization  Findings:  RA = 8  RV = 58/2/11  PA = 54/22 (37)  PCW = 20  Fick cardiac output/index = 4.57/2.67  PVR = 3.7 Woods  FA sat = 95%  PA sat = 58%, 61%  Ao Pressure: 119/55 (82)  LV Pressure: 140/10/22  AV gradient: Max 21 mean 12 mmHG  AVA: 1.62cm2  Left main: Large normal  LAD: Gave off 2 diagonals. 2nd diagonal very large. (larged than LAD). Mid to distal LAD small with mild 30% plaquing  LCX: small. Angiographically normal  RCA: Dominant. Very large. Minimal plaque in the midsection.   Assessment:  1. Minimal non-obstructive CAD  2. CHF with mildly elevated filling pressures and preserved cardiac output. EF ~ 30% by echo  3. Mild pulmonary HTN  4. Mild aortic stenosis  2D echocardiogram with contrast  Study Conclusions  - Left ventricle: Systolic function was severely reduced. The estimated ejection fraction was in the range of 25% to 30%. - Impressions: Limited 2-D contrast echo to evaluate for LV thrombus. LV dilated EF 25-30% no LV thrombus appreciated.  Impressions:  - Limited 2-D contrast echo to evaluate for LV thrombus. LV dilated EF 25-30% no LV thrombus appreciated.  History of Present Illness: [name] is a 77 yo female with PMHx significant for hypertension, type 2 diabetes mellitus, hyperlipidemia, peripheral artery disease no previous cardiac history (normal 2-D echo and 09/2010 ,  normal cardiac catheterization 2008) was admitted to [facility] with acute systolic heart failure.  Hospital Course   She presented to [facility] with worsening shortness of breath, PND, orthopnea and lower extremity edema x3 days. She denied diet or medication changes or recent illness. She was found to be volume overloaded in the ED, without chest pain, fever, chills, nausea or vomiting. Chest x-ray revealed cardiomegaly with interstitial edema, bibasilar atelectasis versus infiltrates and small effusions. BNP elevated at 8569.0. She was subsequently admitted for suspected acute systolic heart failure.   2-D echocardiogram order revealed LVEF of 25-30%, moderate LVH, mild LV dilation, diffuse LV hypokinesis, moderate aortic valve stenosis moderate MVR and LAE. This had changed from echo done 2011 revealing LVEF of 55-60%, and only mild aortic stenosis.  She was started on Lasix IV, diuresed well and was scheduled for cardiac catheterization to assess further hemodynamics and to determine an ischemic source of her worsening heart failure. She was informed, consented and agreed to the procedure which elicited the above findings revealing minimal nonobstructive CAD, mild pulmonary hypertension, mild aortic stenosis and confirmed EF. Plan was for medical management including ACE inhibitor/ARB and beta blocker.  She was also found to be anemic with hemoglobin less than 7. Hemoccult was negative and GI was consulted. Previous colonoscopies are reviewed and suspicion for active bleeding was low. She was diagnosed with chronic iron deficiency anemia and transfused 2 pRBCs and started on iron with moderate improvement in her hemoglobin.  She continued to diuresis well after  transitioning to Lasix PO with her symptoms improving- dyspnea improved and edema resolved. She feels that she is at her baseline today and will be discharged. Medication adjustments and changes have been made during this admission and  will be continued on discharge. She will follow up soon in the heart failure clinic with Dr. Gala Romney and with her PCP Dr. Debby Bud to follow chronic iron deficiency anemia.   Discharge Vitals:  Blood pressure 96/55, pulse 85, temperature 98.3 F (36.8 C), temperature source Oral, resp. rate 18, height 5\' 3"  (1.6 m), weight 66.044 kg (145 lb 9.6 oz), SpO2 92.00%.   Weight change:   On admission 69.6 kg 12/1: 64.9kg Net: -5.4 kg or  I/O net since admission: -5.6965 L  Labs: Recent Labs  [facility] 09/19/11 1549 09/19/11 0605   WBC 8.9 --   HGB 9.7* 9.9*   HCT 30.8* 31.9*   MCV 80.4 --   PLT 309 --    Lab 09/21/11 0500 09/20/11 1100 09/19/11 1549 09/19/11 0605 09/16/11 1539  NA 135 138 -- 138 --  K 3.3* 3.2* -- 3.5 --  CL 95* 94* -- 96 --  CO2 35* 34* -- 34* --  BUN 24* 20 -- 29* --  CREATININE 0.88 0.74 0.75 -- --  CALCIUM 10.0 9.9 -- 10.8* --  PROT -- -- -- -- 5.8*  BILITOT -- -- -- -- 0.3  ALKPHOS -- -- -- -- 33*  ALT -- -- -- -- 22  AST -- -- -- -- 27  AMYLASE -- -- -- -- --  LIPASE -- -- -- -- --  GLUCOSE 158* 195* -- 190* --   Results for Cache, Decoursey Linsey H (MRN [id number]) as of 09/21/2011 14:02  Ref. Range 09/16/2011 12:15  BNP, POC Latest Range: 0-450 pg/mL 8569.0 (H)   Results for MITSY, OWEN (MRN [id number]) as of 09/21/2011 14:02  Ref. Range 09/16/2011 11:29 09/16/2011 15:39 09/17/2011 01:16 09/17/2011 08:30  CK, MB Latest Range: 0.3-4.0 ng/mL 6.9 (HH) 6.6 (HH) 5.0 (H) 4.2 (H)  CK Total Latest Range: 7-177 U/L 172 154 121 98  Troponin I Latest Range: <0.30 ng/mL <0.30 <0.30 <0.30 <0.30   Disposition:  Discharge Orders    Future Appointments: Provider: Department: Dept Phone: Center:   09/26/2011 9:15 AM [facility] [phone] None   11/12/2011 1:30 PM Duke Salvia, MD [facility] [phone] [facility]     Follow-up Information    Follow up with Arvilla Meres, MD on 09/26/2011. (12/7 at 9:15 [facility] [address] [phone])    Contact information:   [address] [place] [place] [address] [phone]       Follow up with Illene Regulus, MD. (Please call first week of January or sooner if needed.  PCP will follow monthly blood work for iron deficiency anemia.)    Contact information:   [address] [facility] [address] [place] [address] [phone]          Discharge Medications:  Current Discharge Medication List    START taking these medications   Details  carvedilol (COREG) 6.25 MG tablet Take 1 tablet (6.25 mg total) by mouth 2 (two) times daily with a meal. Qty: 60 tablet, Refills: 3    docusate sodium 100 MG CAPS Take 100 mg by mouth 2 (two) times daily. Qty: 10 capsule, Refills: 3    ferrous sulfate 325 (65 FE) MG tablet Take 1 tablet (325 mg total) by  mouth 2 (two) times daily with a meal. Qty: 60 tablet, Refills: 3    furosemide (LASIX) 40 MG tablet Take 1 tablet (40 mg total) by mouth daily. Qty: 30 tablet, Refills: 3    losartan (COZAAR) 100 MG tablet Take 1 tablet (100 mg total) by mouth daily. Qty: 30 tablet, Refills: 3    spironolactone (ALDACTONE) 12.5 mg TABS Take 0.5 tablets (12.5 mg total) by mouth daily. Qty: 30 tablet, Refills: 3      CONTINUE these medications which have NOT CHANGED   Details  aspirin EC 81 MG tablet Take 81 mg by mouth daily.      calcium citrate-vitamin D (CITRACAL+D) 315-200 MG-UNIT per tablet Take 1-2 tablets by mouth 2 (two) times daily.     cholecalciferol (VITAMIN D) 1000 UNITS tablet Take 2,000 Units by mouth daily.      fish oil-omega-3 fatty acids 1000 MG capsule Take 1 g by mouth 3 (three) times daily.      Garlic 1000 MG CAPS Take 1 capsule by mouth daily.      metFORMIN (GLUCOPHAGE) 1000 MG tablet Take 500 mg by mouth 2 (two) times daily with a meal.      pioglitazone (ACTOS) 45 MG tablet Take 45 mg by mouth daily.      potassium chloride SA (K-DUR,KLOR-CON) 20 MEQ  tablet Take 20 mEq by mouth daily.      simvastatin (ZOCOR) 80 MG tablet Take 80 mg by mouth daily.        STOP taking these medications     diltiazem (CARDIZEM CD) 180 MG 24 hr capsule Comments:  Reason for Stopping:       losartan-hydrochlorothiazide (HYZAAR) 100-12.5 MG per tablet Comments:  Reason for Stopping:          Outstanding Labs/Studies: Pt's potassium was low at 3.3 this morning. She was given potassium 40 mEq. Will resume outpatient potassium supplementation.  Duration of Discharge Encounter: 40 minutes including physician time.  Signed, Hurman Horn, PA-C 09/21/2011, 2:15 PM

## 2011-09-22 ENCOUNTER — Other Ambulatory Visit: Payer: Self-pay | Admitting: *Deleted

## 2011-09-22 MED ORDER — METFORMIN HCL 1000 MG PO TABS: 500.0000 mg | ORAL_TABLET | Freq: Two times a day (BID) | ORAL | Status: DC

## 2011-09-22 MED ORDER — LOSARTAN POTASSIUM 100 MG PO TABS: 100.0000 mg | ORAL_TABLET | Freq: Every day | ORAL | Status: DC

## 2011-09-22 MED ORDER — SIMVASTATIN 80 MG PO TABS: 80.0000 mg | ORAL_TABLET | Freq: Every day | ORAL | Status: DC

## 2011-09-22 MED ORDER — PIOGLITAZONE HCL 45 MG PO TABS: 45.0000 mg | ORAL_TABLET | Freq: Every day | ORAL | Status: DC

## 2011-09-22 MED ORDER — CARVEDILOL 6.25 MG PO TABS: 6.2500 mg | ORAL_TABLET | Freq: Two times a day (BID) | ORAL | Status: DC

## 2011-09-22 MED FILL — Perflutren Lipid Microsphere IV Susp 6.52 MG/ML: INTRAVENOUS | Qty: 2 | Status: AC

## 2011-09-23 ENCOUNTER — Telehealth: Payer: Self-pay | Admitting: Internal Medicine

## 2011-09-23 NOTE — Telephone Encounter (Signed)
New problem Pt's daughter is calling Per her daughter she came from hospital on Sunday. Her weight has went from 144.5 to 149 since yesterday. She wants to talk to you.

## 2011-09-23 NOTE — Telephone Encounter (Signed)
Joanna [name]'s daughter called to report that Joanna [name] was up from a weight of 144.5 yesterday to 149 today.  She denies increased swelling, sob, etc and states she feels well.

## 2011-09-23 NOTE — Telephone Encounter (Signed)
143 Sun, 144.5 Mon and 149 today, feet are a little swollen this afternoon, no CP or SOB, daughter is very much on top of her sodium intake and she had under 1000 mg yesterday, since wt increasing will go ahead and give pt extra 40 mg lasix today, daughter will call me tomorrow if wt not back down

## 2011-09-26 ENCOUNTER — Other Ambulatory Visit: Payer: Self-pay

## 2011-09-26 ENCOUNTER — Ambulatory Visit (HOSPITAL_COMMUNITY)
Admit: 2011-09-26 | Discharge: 2011-09-26 | Disposition: A | Discharge status: Home / Self Care | Payer: Medicare Other | Source: Ambulatory Visit | Attending: Internal Medicine | Admitting: Internal Medicine

## 2011-09-26 ENCOUNTER — Telehealth (HOSPITAL_COMMUNITY): Payer: Self-pay | Admitting: Adult Health

## 2011-09-26 VITALS — BP 114/48 | HR 78 | Wt 145.8 lb

## 2011-09-26 DIAGNOSIS — I509 Heart failure, unspecified: Secondary | ICD-10-CM | POA: Insufficient documentation

## 2011-09-26 DIAGNOSIS — I5022 Chronic systolic (congestive) heart failure: Secondary | ICD-10-CM | POA: Insufficient documentation

## 2011-09-26 LAB — BASIC METABOLIC PANEL
BUN: 31 mg/dL — ABNORMAL HIGH (ref 6–23)
CO2: 27 mEq/L (ref 19–32)
Calcium: 11 mg/dL — ABNORMAL HIGH (ref 8.4–10.5)
Creatinine, Ser: 0.95 mg/dL (ref 0.50–1.10)

## 2011-09-26 NOTE — Assessment & Plan Note (Signed)
Mild to moderate. Discussed possible progression of AV disease down the road.

## 2011-09-26 NOTE — Progress Notes (Signed)
Patient ID: Joanna [name], female   DOB: 1930/03/18, 77 y.o.   MRN: [id number]  HPI:  Ms. Steffenhagen is a 77 year old woman with aortic stenosis, hypertension, diabetes mellitus type II, hyperlipidemia, and peripheral artery disease.  Admitted earlier this month with acute systolic HF. EF 30%. Cath with normal coronaries and normal cardiac output (see below). AS was mild to moderate. In hospital also found to be iron-deficient. Previous colonoscopies normal. Seen by GI who suggested trial of iron and see if it improved. If not, repeat endo. Stool guaiac was negative.  D/C from hospital 12/ 3/ 2012.  D/C weight 145 (admit weight 160) Admit due to progressive SOB. ARB/Beta blocker initiated and titrated.   She is here for post hospitalization follow up. Weight at home 145 pounds . She did have a weight a 4 pound weight gain. She did take one extra Lasix. She does complain of fatigue and dizziness in the morning. Gets up slowly and then dizziness resolves. No presyncope.   Denies SOB/PND/Orthopnea. Intermittent chest pain every other day.  Able to sleep on one pillow. Lower extremity edema. Tries to follow low salt diet with the assistance of her daughter. Tolerating iron well.    Findings:  RA = 8  RV = 58/2/11 PA = 54/22 (37) PCW = 20 Fick cardiac output/index = 4.57/2.67 PVR = 3.7 Woods    ROS: All systems negative except as listed in HPI, PMH and Problem List.  Past Medical History  Diagnosis Date  . HTN (hypertension)   . Diabetes mellitus   . Peripheral artery disease   . Hyperlipidemia   . Anemia   . PONV (postoperative nausea and vomiting)   . Shortness of breath   . CHF (congestive heart failure)   . Arthritis     Current Outpatient Prescriptions  Medication Sig Dispense Refill  . aspirin EC 81 MG tablet Take 81 mg by mouth daily.        . calcium citrate-vitamin D (CITRACAL+D) 315-200 MG-UNIT per tablet Take 1-2 tablets by mouth 2 (two) times daily.       . carvedilol (COREG)  6.25 MG tablet Take 1 tablet (6.25 mg total) by mouth 2 (two) times daily with a meal.  180 tablet  3  . cholecalciferol (VITAMIN D) 1000 UNITS tablet Take 2,000 Units by mouth daily.        Marland Kitchen docusate sodium 100 MG CAPS Take 100 mg by mouth 2 (two) times daily.  10 capsule  3  . ferrous sulfate 325 (65 FE) MG tablet Take 1 tablet (325 mg total) by mouth 2 (two) times daily with a meal.  60 tablet  3  . fish oil-omega-3 fatty acids 1000 MG capsule Take 1 g by mouth 2 (two) times daily. 2 caps in the AM, 1 cap in the PM      . furosemide (LASIX) 40 MG tablet Take 1 tablet (40 mg total) by mouth daily.  30 tablet  3  . Garlic 1000 MG CAPS Take 1 capsule by mouth daily.        Marland Kitchen losartan (COZAAR) 100 MG tablet Take 1 tablet (100 mg total) by mouth daily.  30 tablet  3  . metFORMIN (GLUCOPHAGE) 1000 MG tablet Take 1,000 mg by mouth 2 (two) times daily with a meal.        . pioglitazone (ACTOS) 45 MG tablet Take 1 tablet (45 mg total) by mouth daily.  90 tablet  3  . potassium  chloride SA (K-DUR,KLOR-CON) 20 MEQ tablet Take 20 mEq by mouth daily.        . simvastatin (ZOCOR) 80 MG tablet Take 1 tablet (80 mg total) by mouth daily.  90 tablet  3  . spironolactone (ALDACTONE) 12.5 mg TABS Take 0.5 tablets (12.5 mg total) by mouth daily.  30 tablet  3     PHYSICAL EXAM: Filed Vitals:   09/26/11 0928  BP: 114/48  Pulse: 78   Weight change: 145 no change General:  Well appearing. No resp difficulty HEENT: normal Neck: supple. JVP 6-7 Carotids 2+ bilaterally; no bruits. No lymphadenopathy or thryomegaly appreciated. Cor: PMI normal. Regular rate & rhythm. No rubs, gallops or 2/6 aortic murmur. Lungs: clear Abdomen: soft, nontender, nondistended. No hepatosplenomegaly. No bruits or masses. Good bowel sounds. Extremities: no cyanosis, clubbing, rash, trace edema Neuro: alert & orientedx3, cranial nerves grossly intact. Moves all 4 extremities w/o difficulty. Affect pleasant   ECG: NSR 67 LBBB  (chronic)   ASSESSMENT & PLAN:

## 2011-09-26 NOTE — Assessment & Plan Note (Signed)
Doing well. JVP slightly up but weight relatively stable and has some dizziness. Given AS would not push volume status any lower. Goal weight 143-146. Reviewed sliding scale lasix regimen closely. Discussed safety measures at length to prevent falling. Will place TED hose, get her a walker to get from bed to bathroom and suggested a Life Alert system. Call if dizziness getting worse. Will check labs and see back in 2 weeks.   Total MD time spent = 40 mins with over 70% of that time dedicated to counseling and discussions described above.

## 2011-09-26 NOTE — Patient Instructions (Signed)
Labs today  We have given you a prescription for compression hose  We have given you a prescription for a rolling walker  You may return to work 4 hours a day for 4 days a week  Your physician recommends that you schedule a follow-up appointment in: 2 weeks

## 2011-09-26 NOTE — Telephone Encounter (Signed)
Provided lab results.Instructed to stop potassium. Recheck labs next week.

## 2011-09-26 NOTE — Assessment & Plan Note (Signed)
Continue iron. F/u with PCP.

## 2011-09-30 ENCOUNTER — Telehealth (HOSPITAL_COMMUNITY): Payer: Self-pay | Admitting: *Deleted

## 2011-09-30 NOTE — Telephone Encounter (Signed)
Spoke w/pt's daughter her wt has continued to decrease and is 139 today, per Tonye Becket, NP have pt stop Lasix and take only if wt up 3 lbs in 24 hours daughter verbalizes understanding,

## 2011-09-30 NOTE — Telephone Encounter (Signed)
Ms Kirn's daughter called this am.  She is concerned about her mother's weight loss, she is currently down to 139 lbs.  She is wondering if she should change the amount of lasix she is taking.

## 2011-10-03 ENCOUNTER — Encounter: Payer: Medicare Other | Admitting: Internal Medicine

## 2011-10-10 ENCOUNTER — Ambulatory Visit (HOSPITAL_COMMUNITY)
Admission: RE | Admit: 2011-10-10 | Discharge: 2011-10-10 | Disposition: A | Discharge status: Home / Self Care | Payer: Medicare Other | Source: Ambulatory Visit | Attending: Internal Medicine | Admitting: Internal Medicine

## 2011-10-10 VITALS — BP 118/54 | HR 94 | Wt 147.0 lb

## 2011-10-10 DIAGNOSIS — I5022 Chronic systolic (congestive) heart failure: Secondary | ICD-10-CM | POA: Insufficient documentation

## 2011-10-10 DIAGNOSIS — I350 Nonrheumatic aortic (valve) stenosis: Secondary | ICD-10-CM

## 2011-10-10 DIAGNOSIS — I359 Nonrheumatic aortic valve disorder, unspecified: Secondary | ICD-10-CM | POA: Insufficient documentation

## 2011-10-10 DIAGNOSIS — D509 Iron deficiency anemia, unspecified: Secondary | ICD-10-CM | POA: Insufficient documentation

## 2011-10-10 DIAGNOSIS — I509 Heart failure, unspecified: Secondary | ICD-10-CM | POA: Insufficient documentation

## 2011-10-10 LAB — BASIC METABOLIC PANEL
CO2: 25 mEq/L (ref 19–32)
Chloride: 97 mEq/L (ref 96–112)
GFR calc Af Amer: >90 mL/min (ref >90)
Potassium: 5.6 mEq/L — ABNORMAL HIGH (ref 3.5–5.1)

## 2011-10-10 LAB — CBC
HCT: 33.4 % — ABNORMAL LOW (ref 36.0–46.0)
Hemoglobin: 10.4 g/dL — ABNORMAL LOW (ref 12.0–15.0)
MCV: 84.6 fL (ref 78.0–100.0)
RBC: 3.95 MIL/uL (ref 3.87–5.11)
RDW: 20 % — ABNORMAL HIGH (ref 11.5–15.5)
WBC: 8.4 10*3/uL (ref 4.0–10.5)

## 2011-10-10 MED ORDER — FUROSEMIDE 40 MG PO TABS: 20.0000 mg | ORAL_TABLET | Freq: Every day | ORAL | Status: DC

## 2011-10-10 MED ORDER — CARVEDILOL 6.25 MG PO TABS: 9.3750 mg | ORAL_TABLET | Freq: Two times a day (BID) | ORAL | Status: DC

## 2011-10-10 NOTE — Assessment & Plan Note (Signed)
Stable. Mild to moderate AS.

## 2011-10-10 NOTE — Assessment & Plan Note (Addendum)
Volume status mildly elevated. Will restart at 20 daily. Gave explicit directions about sliding scale diuretic. Goal weight 140-144. Less than 140 no lasix. 140-144 lasix 20 daily. Weight 145 or greater take lasix 40 daily. Increase carvedilol 9.375 bid.   MD time spent 40 mins

## 2011-10-10 NOTE — Assessment & Plan Note (Signed)
On iron. Recheck CBC.

## 2011-10-10 NOTE — Progress Notes (Signed)
Patient ID: Joanna [name], female   DOB: Feb 25, 1930, 77 y.o.   MRN: [id number]  HPI:  Joanna [name] is a 77 year old woman with aortic stenosis, hypertension, diabetes mellitus type II, hyperlipidemia, and peripheral artery disease.  Admitted earlier this month with acute systolic HF, EF 30%. Cath with normal coronaries and normal cardiac output (see below). AS was mild to moderate. In hospital also found to be iron-deficient. Previous colonoscopies normal. Seen by GI who suggested trial of iron and see if it improved. If not, repeat endo. Stool guaiac was negative.   RA = 8 RV = 58/2/11 PA = 54/22 (37) PCW = 20 Fick cardiac output/index = 4.57/2.67 PVR = 3.7 Woods   D/C from hospital 12/ 3/ 2012. D/C weight 145 (admit weight 160) Admit due to progressive SOB. ARB/Beta blocker initiated and titrated.   She returns for follow up today. She feels great. She is working 4 hours a day 4 d/wk without difficulty. No SOB/PND/Orthopnea. Weight stable 141-144 at home. Weight decreased to 139 at home and lasix was changed to prn only. Has not taken any lasix in several days. Tries to follow low salt diet with the assistance of her daughter. Tolerating iron well.   ROS: All systems negative except as listed in HPI, PMH and Problem List.  Past Medical History  Diagnosis Date  . HTN (hypertension)   . Diabetes mellitus   . Peripheral artery disease   . Hyperlipidemia   . Anemia   . PONV (postoperative nausea and vomiting)   . Shortness of breath   . CHF (congestive heart failure)   . Arthritis     Current Outpatient Prescriptions  Medication Sig Dispense Refill  . aspirin EC 81 MG tablet Take 81 mg by mouth daily.        . calcium citrate-vitamin D (CITRACAL+D) 315-200 MG-UNIT per tablet Take 1-2 tablets by mouth 2 (two) times daily.       . carvedilol (COREG) 6.25 MG tablet Take 1.5 tablets (9.375 mg total) by mouth 2 (two) times daily with a meal.  180 tablet  3  . cholecalciferol (VITAMIN D) 1000  UNITS tablet Take 2,000 Units by mouth daily.        Marland Kitchen docusate sodium (COLACE) 100 MG capsule Take 100 mg by mouth 2 (two) times daily.        . ferrous sulfate 325 (65 FE) MG tablet Take 1 tablet (325 mg total) by mouth 2 (two) times daily with a meal.  60 tablet  3  . fish oil-omega-3 fatty acids 1000 MG capsule Take 1 g by mouth 2 (two) times daily. 2 caps in the AM, 1 cap in the PM      . furosemide (LASIX) 40 MG tablet Take 0.5 tablets (20 mg total) by mouth daily.  30 tablet  0  . Garlic 1000 MG CAPS Take 1 capsule by mouth daily.        Marland Kitchen losartan (COZAAR) 100 MG tablet Take 1 tablet (100 mg total) by mouth daily.  30 tablet  3  . metFORMIN (GLUCOPHAGE) 1000 MG tablet Take 1,000 mg by mouth 2 (two) times daily with a meal.        . pioglitazone (ACTOS) 45 MG tablet Take 1 tablet (45 mg total) by mouth daily.  90 tablet  3  . simvastatin (ZOCOR) 80 MG tablet Take 1 tablet (80 mg total) by mouth daily.  90 tablet  3  . spironolactone (ALDACTONE) 12.5 mg  TABS Take 0.5 tablets (12.5 mg total) by mouth daily.  30 tablet  3     PHYSICAL EXAM: Filed Vitals:   10/10/11 1020  BP: 118/54  Pulse: 94   Wt 147 (145) General:  Well appearing. No resp difficulty HEENT: normal Neck: supple. JVP 8 Carotids 2+ bilaterally; no bruits. No lymphadenopathy or thryomegaly appreciated. Cor: PMI normal. Regular rate & rhythm. No rubs, gallops or 2/6 aortic murmur. Lungs: clear Abdomen: soft, nontender, nondistended. No hepatosplenomegaly. No bruits or masses. Good bowel sounds. Extremities: no cyanosis, clubbing, rash, trace-1+ edema Neuro: alert & orientedx3, cranial nerves grossly intact. Moves all 4 extremities w/o difficulty. Affect pleasant  ASSESSMENT & PLAN:

## 2011-10-10 NOTE — Patient Instructions (Addendum)
Take lasix 40 mg today.  Take lasix 20 mg (1/2 tab).  If weight below 140 lbs do not take any lasix.  If weight above 145 lbs take lasix 40 mg (1 tab).   Increase carvedilol 9.375 mg (1.5 tab) twice daily   Labs today.   Do the following things EVERYDAY: 1) Weigh yourself in the morning before breakfast. Write it down and keep it in a log. 2) Take your medicines as prescribed 3) Eat low salt foods-Limit salt (sodium) to 2000mg  per day.  4) Stay as active as you can everyday

## 2011-10-13 ENCOUNTER — Other Ambulatory Visit: Payer: Self-pay | Admitting: *Deleted

## 2011-10-13 MED ORDER — METFORMIN HCL 1000 MG PO TABS: 1000.0000 mg | ORAL_TABLET | Freq: Two times a day (BID) | ORAL | Status: DC

## 2011-10-13 MED ORDER — PIOGLITAZONE HCL 45 MG PO TABS: 45.0000 mg | ORAL_TABLET | Freq: Every day | ORAL | Status: DC

## 2011-10-13 MED ORDER — FERROUS SULFATE 325 (65 FE) MG PO TABS: 325.0000 mg | ORAL_TABLET | Freq: Two times a day (BID) | ORAL | Status: DC

## 2011-10-16 ENCOUNTER — Telehealth: Payer: Self-pay | Admitting: Internal Medicine

## 2011-10-16 NOTE — Telephone Encounter (Signed)
Received copies from [facility],on 12.27.12 . Forwarded 1 pages to Dr. Debby Bud ,for review.  sj

## 2011-10-17 ENCOUNTER — Telehealth (HOSPITAL_COMMUNITY): Payer: Self-pay | Admitting: Vascular Surgery

## 2011-10-17 NOTE — Telephone Encounter (Signed)
Heather,         Please call Marche Hottenstein needs to come in to get blood work. Told someone will call her Monday morning. She gets off of work at 1130am. [employer]

## 2011-10-20 ENCOUNTER — Encounter: Payer: Medicare Other | Admitting: *Deleted

## 2011-10-23 ENCOUNTER — Other Ambulatory Visit (HOSPITAL_COMMUNITY): Payer: Self-pay | Admitting: Adult Health

## 2011-10-23 ENCOUNTER — Other Ambulatory Visit: Payer: Medicare Other | Admitting: *Deleted

## 2011-10-23 DIAGNOSIS — I5022 Chronic systolic (congestive) heart failure: Secondary | ICD-10-CM

## 2011-10-23 LAB — BASIC METABOLIC PANEL
BUN: 27 mg/dL — ABNORMAL HIGH (ref 6–23)
CO2: 30 mEq/L (ref 19–32)
Chloride: 101 mEq/L (ref 96–112)
Glucose, Bld: 116 mg/dL — ABNORMAL HIGH (ref 70–99)
Potassium: 5 mEq/L (ref 3.5–5.1)

## 2011-10-24 ENCOUNTER — Encounter: Payer: Self-pay | Admitting: Internal Medicine

## 2011-10-27 NOTE — Telephone Encounter (Signed)
Pt had f/u labs, spoke w/pt's daughter on 1/7

## 2011-11-05 ENCOUNTER — Other Ambulatory Visit: Payer: Self-pay | Admitting: *Deleted

## 2011-11-05 DIAGNOSIS — I6529 Occlusion and stenosis of unspecified carotid artery: Secondary | ICD-10-CM

## 2011-11-06 ENCOUNTER — Other Ambulatory Visit: Payer: Self-pay | Admitting: *Deleted

## 2011-11-06 DIAGNOSIS — I6529 Occlusion and stenosis of unspecified carotid artery: Secondary | ICD-10-CM

## 2011-11-07 ENCOUNTER — Encounter (INDEPENDENT_AMBULATORY_CARE_PROVIDER_SITE_OTHER): Payer: Medicare Other | Admitting: *Deleted

## 2011-11-07 DIAGNOSIS — I6529 Occlusion and stenosis of unspecified carotid artery: Secondary | ICD-10-CM

## 2011-11-12 ENCOUNTER — Ambulatory Visit (INDEPENDENT_AMBULATORY_CARE_PROVIDER_SITE_OTHER): Payer: Medicare Other | Admitting: Internal Medicine

## 2011-11-12 DIAGNOSIS — D509 Iron deficiency anemia, unspecified: Secondary | ICD-10-CM

## 2011-11-12 DIAGNOSIS — I10 Essential (primary) hypertension: Secondary | ICD-10-CM

## 2011-11-12 DIAGNOSIS — I359 Nonrheumatic aortic valve disorder, unspecified: Secondary | ICD-10-CM

## 2011-11-12 DIAGNOSIS — Z0000 Encounter for general adult medical examination without abnormal findings: Secondary | ICD-10-CM

## 2011-11-12 DIAGNOSIS — I6529 Occlusion and stenosis of unspecified carotid artery: Secondary | ICD-10-CM

## 2011-11-12 DIAGNOSIS — E119 Type 2 diabetes mellitus without complications: Secondary | ICD-10-CM

## 2011-11-12 DIAGNOSIS — I350 Nonrheumatic aortic (valve) stenosis: Secondary | ICD-10-CM

## 2011-11-12 DIAGNOSIS — E785 Hyperlipidemia, unspecified: Secondary | ICD-10-CM

## 2011-11-12 DIAGNOSIS — I5022 Chronic systolic (congestive) heart failure: Secondary | ICD-10-CM

## 2011-11-12 MED ORDER — PIOGLITAZONE HCL 45 MG PO TABS: 45.0000 mg | ORAL_TABLET | Freq: Every day | ORAL | Status: DC

## 2011-11-12 MED ORDER — METFORMIN HCL 1000 MG PO TABS: 1000.0000 mg | ORAL_TABLET | Freq: Two times a day (BID) | ORAL | Status: DC

## 2011-11-12 MED ORDER — SIMVASTATIN 80 MG PO TABS: 80.0000 mg | ORAL_TABLET | Freq: Every day | ORAL | Status: DC

## 2011-11-12 NOTE — Assessment & Plan Note (Addendum)
Lab Results  Component Value Date   CHOL 190 11/15/2010   HDL 97.30 11/15/2010   LDLCALC 75 11/15/2010   LDLDIRECT 119.3 05/21/2007   TRIG 88.0 11/15/2010   CHOLHDL 2 11/15/2010   Excellent control  Due for follow-up lab at next draw - Early March

## 2011-11-12 NOTE — Assessment & Plan Note (Signed)
Lab Results  Component Value Date   HGBA1C 6.7* 09/17/2011   Excellent control. Due for follow up lab in early march.  Plan - continue present regimen.

## 2011-11-12 NOTE — Assessment & Plan Note (Signed)
Followed by Cardiology - working to tightly control fluid status and BP

## 2011-11-12 NOTE — Assessment & Plan Note (Signed)
Lab Results  Component Value Date   HGB 10.4* 10/10/2011   Plan - continue iron replacement therapy.           Follow-up lab in March

## 2011-11-12 NOTE — Assessment & Plan Note (Signed)
Patient appears clinically stable at today's visit. Medications reviewed. She will follow-up with Dr. Gala Romney as directed

## 2011-11-12 NOTE — Progress Notes (Signed)
Subjective:    Patient ID: Joanna [name], female    DOB: December 12, 1929, 78 y.o.   MRN: [id number]  HPI The patient is here for annual Medicare wellness examination and management of other chronic and acute problems. She is feeling really well.   IN the interval since her last exam she was hospitalized in Nov '12 for acute systolic heart failure. Studies revealed an EF 25-50% with LV hypokinesis and AS on echo; cardiac cath with non-obstructive disease. She has followed with Dr. Gala Romney Dec 7th and 21st and was doing well but due to minor weight gain she was restarted on lasix.  During her hospitalization she was found to have iron deficiency anemia and was started on iron replacement. Due to recen t colonoscopy GI did not feel she needed any further GI testing and she has been taking iron supplement.   The risk factors are reflected in the social history.  The roster of all physicians providing medical care to patient - is listed in the Snapshot section of the chart.  Activities of daily living:  The patient is 100% inedpendent in all ADLs: dressing, toileting, feeding as well as independent mobility  Home safety : The patient has no smoke detectors in the home. Falls - house is fall -safe. She needs to consider grab bars.They wear seatbelts. No firearms at home .  There is no risks for hepatitis, STDs or HIV. There is no   history of blood transfusion. They have no travel history to infectious disease endemic areas of the world.  The patient has seen their dentist in the last six month. They have seen their eye doctor in the last year. They deny any hearing difficulty and have not had audiologic testing in the last year.  They do not  have excessive sun exposure. Discussed the need for sun protection: hats, long sleeves and use of sunscreen if there is significant sun exposure.   Diet: the importance of a healthy diet is discussed. They do have a healthy diet.  The patient has a regular  exercise program: walking , 30 min duration, 7 per week.  The benefits of regular aerobic exercise were discussed.  Depression screen: there are no signs or vegative symptoms of depression- irritability, change in appetite, anhedonia, sadness/tearfullness.  Cognitive assessment: the patient manages all their financial and personal affairs and is actively engaged.   The following portions of the patient's history were reviewed and updated as appropriate: allergies, current medications, past family history, past medical history,  past surgical history, past social history  and problem list.  Vision, hearing, body mass index were assessed and reviewed.   During the course of the visit the patient was educated and counseled about appropriate screening and preventive services including : fall prevention , diabetes screening, nutrition counseling, colorectal cancer screening, and recommended immunizations.  Past Medical History  Diagnosis Date  . HTN (hypertension)   . Diabetes mellitus   . Peripheral artery disease   . Hyperlipidemia   . Anemia   . PONV (postoperative nausea and vomiting)   . Shortness of breath   . CHF (congestive heart failure)   . Arthritis   . Aortic stenosis 09/21/2011  . AORTIC VALVE SCLEROSIS 02/21/2010  . 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 BUNDLE BRANCH BLOCK, LEFT 07/30/2007  . CAROTID ARTERY STENOSIS 01/05/2009  . CVA 03/14/2010  . DM 07/30/2007  . Edema 04/07/2008  . Herpes zoster without mention of complication 09/29/2007  . 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 HYPERLIPIDEMIA  07/30/2007  . HYPERTENSION 07/30/2007  . Iron deficiency anemia 09/21/2011  . PERIPHERAL VASCULAR DISEASE 07/30/2007  . POLYPECTOMY, HX OF 07/30/2007  . PULMONARY HYPERTENSION 03/23/2009  . SKIN RASH, ALLERGIC 04/07/2008   Past Surgical History  Procedure Date  . Carotid endarterectomy   . Polypectomy 12/27/04  . Cataract extraction 2009   Family History  Problem Relation Age of Onset  .  Colon cancer Mother   . Diabetes Neg Hx   . Coronary artery disease Neg Hx    History   Social History  . Marital Status: Divorced    Spouse Name: N/A    Number of Children: N/A  . Years of Education: N/A   Occupational History  . Not on file.   Social History Main Topics  . Smoking status: Never Smoker   . Smokeless tobacco: Never Used  . Alcohol Use: No  . Drug Use: No  . Sexually Active: Not Currently    Birth Control/ Protection: Post-menopausal   Other Topics Concern  . Not on file   Social History Narrative   Patient lives alone here in townShe continues to work, helping to bind and oversew booksShe is divorced after 23 years of marriage1 son- '61, minister; 1 dtr '55; 4 grandchildrenEnd of life care-provided packet on living well        Review of Systems Constitutional:  Negative for fever, chills, activity change and unexpected weight change.  HEENT:  Negative for hearing loss, ear pain, congestion, neck stiffness and postnasal drip. Negative for sore throat or swallowing problems. Negative for dental complaints.   Eyes: Negative for vision loss or change in visual acuity.  Respiratory: Negative for chest tightness and wheezing. Negative for DOE.   Cardiovascular: Negative for chest pain or palpitations. No decreased exercise tolerance Gastrointestinal: No change in bowel habit. No bloating or gas. No reflux or indigestion Genitourinary: Negative for urgency, frequency, flank pain and difficulty urinating.  Musculoskeletal: Positive for myalgias, back pain, arthralgias and gait problem. worse pain in knees Neurological: Negative for dizziness, tremors, weakness and headaches.  Hematological: Negative for adenopathy.  Psychiatric/Behavioral: Negative for behavioral problems and dysphoric mood.       Objective:   Physical Exam Filed Vitals:   11/12/11 1322  BP: 144/82  Pulse: 83  Temp: 98 F (36.7 C)   Gen'l - WNWD white woman in no distress HEENT -  [place]/AT, lid lag from excess blepheral tissue but not crossing the pupil Neck - supple Pum - normal respirations, Lungs CTAP Cor - RRR Neuro - A&O x 3, CN II- XII normal  Lab Results  Component Value Date   WBC 8.4 10/10/2011   HGB 10.4* 10/10/2011   HCT 33.4* 10/10/2011   PLT 346 10/10/2011   GLUCOSE 116* 10/23/2011   CHOL 190 11/15/2010   TRIG 88.0 11/15/2010   HDL 97.30 11/15/2010   LDLDIRECT 119.3 05/21/2007   LDLCALC 75 11/15/2010   ALT 22 09/16/2011   AST 27 09/16/2011   NA 138 10/23/2011   K 5.0 10/23/2011   CL 101 10/23/2011   CREATININE 0.9 10/23/2011   BUN 27* 10/23/2011   CO2 30 10/23/2011   TSH 1.47 10/26/2009   INR 1.12 09/17/2011   HGBA1C 6.7* 09/17/2011         Assessment & Plan:

## 2011-11-12 NOTE — Assessment & Plan Note (Signed)
BP Readings from Last 3 Encounters:  11/12/11 144/82  10/10/11 118/54  09/26/11 114/48   Generally good control.  Plan - continue present regimen

## 2011-11-12 NOTE — Assessment & Plan Note (Signed)
Carotid Doppler Jan 18th , '13 - results pending

## 2011-11-13 DIAGNOSIS — Z0000 Encounter for general adult medical examination without abnormal findings: Secondary | ICD-10-CM | POA: Insufficient documentation

## 2011-11-13 NOTE — Assessment & Plan Note (Signed)
Interval history has been eventful with hospitalization for CHF with a cardiomyopathy now well managed by Dr. Gala Romney; for iron deficiency anemia now on iron replacement. Fortunately she is feeling well today and has returned to work 4 hours a day. Limited physical exam is unremarkable. Most recent labs reviewed: good control of blood sugar, general chemistries are OK. She is current with colorectal cancer screening, she is current with breast cancer screening. Immunizations are up to date.   In summary - a very nice woman who is medically stable at today's exam. She will follow-up with Dr. Gala Romney as scheduled and will return to Primary Care as needed or in 4 months.

## 2011-11-17 ENCOUNTER — Ambulatory Visit (HOSPITAL_COMMUNITY)
Admission: RE | Admit: 2011-11-17 | Discharge: 2011-11-17 | Disposition: A | Discharge status: Home / Self Care | Payer: Medicare Other | Source: Ambulatory Visit | Attending: Internal Medicine | Admitting: Internal Medicine

## 2011-11-17 VITALS — BP 122/50 | HR 95 | Wt 145.5 lb

## 2011-11-17 DIAGNOSIS — I350 Nonrheumatic aortic (valve) stenosis: Secondary | ICD-10-CM

## 2011-11-17 DIAGNOSIS — I359 Nonrheumatic aortic valve disorder, unspecified: Secondary | ICD-10-CM

## 2011-11-17 DIAGNOSIS — I5022 Chronic systolic (congestive) heart failure: Secondary | ICD-10-CM

## 2011-11-17 MED ORDER — FUROSEMIDE 40 MG PO TABS: 20.0000 mg | ORAL_TABLET | ORAL | Status: DC

## 2011-11-17 MED ORDER — CARVEDILOL 6.25 MG PO TABS: 12.5000 mg | ORAL_TABLET | Freq: Two times a day (BID) | ORAL | Status: DC

## 2011-11-17 NOTE — Progress Notes (Signed)
Patient ID: Joanna [name], female   DOB: 17-Feb-1930, 78 y.o.   MRN: [id number]  HPI:  [name] is a 78 year old woman with aortic stenosis, hypertension, diabetes mellitus type II, hyperlipidemia, and peripheral artery disease.  Admitted earlier this month with acute systolic HF, EF 30%. Cath with normal coronaries and normal cardiac output (see below). AS was mild to moderate. In hospital also found to be iron-deficient. Previous colonoscopies normal. Seen by GI who suggested trial of iron and see if it improved. If not, repeat endo. Stool guaiac was negative.   RA = 8 RV = 58/2/11 PA = 54/22 (37) PCW = 20 Fick cardiac output/index = 4.57/2.67 PVR = 3.7 Woods   D/C from hospital 12/ 3/ 2012. D/C weight 145 (admit weight 160) Admit due to progressive SOB. ARB/Beta blocker initiated and titrated.   At last visit carvedilol titrated to 9.375 bid.  Tolerated well.  She returns for follow up today. She feels great. She is working 4 hours a day 5 d/wk without difficulty. No SOB/PND/Orthopnea. Weight stable 141-144 at home. Has only had to take one dose of lasix. Spironolactone stopped due to persistent hyperkalemia. No dizziness, orthopnea/PND. Walks around building at work and feels great.    ROS: All systems negative except as listed in HPI, PMH and Problem List.  Past Medical History  Diagnosis Date  . HTN (hypertension)   . Diabetes mellitus   . Peripheral artery disease   . Hyperlipidemia   . Anemia   . PONV (postoperative nausea and vomiting)   . Shortness of breath   . CHF (congestive heart failure)   . Arthritis     Current Outpatient Prescriptions  Medication Sig Dispense Refill  . aspirin EC 81 MG tablet Take 81 mg by mouth daily.       . calcium citrate-vitamin D (CITRACAL+D) 315-200 MG-UNIT per tablet Take 2 tablets by mouth 2 (two) times daily.       . carvedilol (COREG) 6.25 MG tablet Take 1.5 tablets (9.375 mg total) by mouth 2 (two) times daily with a meal.  180 tablet   3  . cholecalciferol (VITAMIN D) 1000 UNITS tablet Take 2,000 Units by mouth daily.        Marland Kitchen docusate sodium (COLACE) 100 MG capsule Take 100 mg by mouth 2 (two) times daily.        . ferrous sulfate 325 (65 FE) MG tablet Take 1 tablet (325 mg total) by mouth 2 (two) times daily with a meal.  180 tablet  1  . fish oil-omega-3 fatty acids 1000 MG capsule Take 1 g by mouth 2 (two) times daily.       . furosemide (LASIX) 40 MG tablet Take 0.5 tablets (20 mg total) by mouth daily.  30 tablet  0  . Garlic 1000 MG CAPS Take 1 capsule by mouth daily.        Marland Kitchen losartan (COZAAR) 100 MG tablet Take 1 tablet (100 mg total) by mouth daily.  30 tablet  3  . metFORMIN (GLUCOPHAGE) 1000 MG tablet Take 1 tablet (1,000 mg total) by mouth 2 (two) times daily with a meal.  180 tablet  1  . pioglitazone (ACTOS) 45 MG tablet Take 1 tablet (45 mg total) by mouth daily.  90 tablet  3  . simvastatin (ZOCOR) 80 MG tablet Take 1 tablet (80 mg total) by mouth daily.  90 tablet  3  . spironolactone (ALDACTONE) 12.5 mg TABS Take 0.5 tablets (12.5  mg total) by mouth daily.  30 tablet  3     PHYSICAL EXAM: Filed Vitals:   11/17/11 1315  BP: 122/50  Pulse: 95   Wt 145 (144) General:  Well appearing. No resp difficulty HEENT: normal Neck: supple. JVP 9-10 + prominent CV waves Carotids 2+ bilaterally; no bruits. No lymphadenopathy or thryomegaly appreciated. Cor: PMI normal. Regular rate & rhythm. No rubs, gallops or 2/6 aortic murmur. Lungs: clear Abdomen: soft, nontender, nondistended. No hepatosplenomegaly. No bruits or masses. Good bowel sounds. Extremities: no cyanosis, clubbing, rash, trace-1+ edema Neuro: alert & orientedx3, cranial nerves grossly intact. Moves all 4 extremities w/o difficulty. Affect pleasant  ASSESSMENT & PLAN:

## 2011-11-17 NOTE — Assessment & Plan Note (Signed)
Stable. Asymptomatic. Continue to follow.

## 2011-11-17 NOTE — Assessment & Plan Note (Signed)
Continues to improve. Had to stop spironolactone recently due to hyperkalemia. Volume status mildly elevated. Restart lasix at 20 mg every other day. Increase carvedilol to 12.5 bid. Ok to go back to work full time with breaks as needed. BMET 2 weeks. Return in 1 month with echo.

## 2011-11-17 NOTE — Patient Instructions (Signed)
Take carvedilol 12.5 mg twice a day  Take Lasix 20 mg every other day  Obtain BMET at [facility] in 2 weeks  Please schedule an ECHO in one month with your follow up.  You may return to work full time.

## 2011-11-21 ENCOUNTER — Other Ambulatory Visit: Payer: Medicare Other | Admitting: *Deleted

## 2011-11-21 DIAGNOSIS — I5022 Chronic systolic (congestive) heart failure: Secondary | ICD-10-CM

## 2011-11-21 LAB — BASIC METABOLIC PANEL
Chloride: 96 mEq/L (ref 96–112)
GFR: 58.83 mL/min — ABNORMAL LOW (ref >60.00)
Glucose, Bld: 96 mg/dL (ref 70–99)
Potassium: 4.1 mEq/L (ref 3.5–5.1)
Sodium: 135 mEq/L (ref 135–145)

## 2011-11-28 ENCOUNTER — Telehealth: Payer: Self-pay | Admitting: Internal Medicine

## 2011-11-28 MED ORDER — SIMVASTATIN 80 MG PO TABS: 80.0000 mg | ORAL_TABLET | Freq: Every day | ORAL | Status: DC

## 2011-11-28 NOTE — Telephone Encounter (Signed)
PT NEEDS A REFILL ON SIMVASTATIN SENT TO [facility] IN [place].

## 2011-12-18 ENCOUNTER — Ambulatory Visit (HOSPITAL_COMMUNITY)
Admission: RE | Admit: 2011-12-18 | Discharge: 2011-12-18 | Disposition: A | Discharge status: Home / Self Care | Payer: Medicare Other | Source: Ambulatory Visit | Attending: Internal Medicine | Admitting: Internal Medicine

## 2011-12-18 ENCOUNTER — Ambulatory Visit (HOSPITAL_COMMUNITY)
Admission: RE | Admit: 2011-12-18 | Discharge: 2011-12-18 | Disposition: A | Discharge status: Home / Self Care | Payer: Medicare Other | Source: Ambulatory Visit | Attending: Adult Health | Admitting: Adult Health

## 2011-12-18 ENCOUNTER — Encounter (HOSPITAL_COMMUNITY): Payer: Self-pay

## 2011-12-18 VITALS — BP 132/56 | HR 102 | Wt 146.0 lb

## 2011-12-18 DIAGNOSIS — I5022 Chronic systolic (congestive) heart failure: Secondary | ICD-10-CM

## 2011-12-18 DIAGNOSIS — E785 Hyperlipidemia, unspecified: Secondary | ICD-10-CM | POA: Insufficient documentation

## 2011-12-18 DIAGNOSIS — I509 Heart failure, unspecified: Secondary | ICD-10-CM | POA: Insufficient documentation

## 2011-12-18 DIAGNOSIS — M129 Arthropathy, unspecified: Secondary | ICD-10-CM | POA: Insufficient documentation

## 2011-12-18 DIAGNOSIS — I739 Peripheral vascular disease, unspecified: Secondary | ICD-10-CM | POA: Insufficient documentation

## 2011-12-18 DIAGNOSIS — D649 Anemia, unspecified: Secondary | ICD-10-CM | POA: Insufficient documentation

## 2011-12-18 DIAGNOSIS — E119 Type 2 diabetes mellitus without complications: Secondary | ICD-10-CM | POA: Insufficient documentation

## 2011-12-18 DIAGNOSIS — I10 Essential (primary) hypertension: Secondary | ICD-10-CM | POA: Insufficient documentation

## 2011-12-18 DIAGNOSIS — I2789 Other specified pulmonary heart diseases: Secondary | ICD-10-CM | POA: Insufficient documentation

## 2011-12-18 DIAGNOSIS — I359 Nonrheumatic aortic valve disorder, unspecified: Secondary | ICD-10-CM

## 2011-12-18 DIAGNOSIS — Z7982 Long term (current) use of aspirin: Secondary | ICD-10-CM | POA: Insufficient documentation

## 2011-12-18 DIAGNOSIS — I350 Nonrheumatic aortic (valve) stenosis: Secondary | ICD-10-CM

## 2011-12-18 DIAGNOSIS — Q251 Coarctation of aorta: Secondary | ICD-10-CM | POA: Insufficient documentation

## 2011-12-18 MED ORDER — CARVEDILOL 6.25 MG PO TABS: 12.5000 mg | ORAL_TABLET | Freq: Two times a day (BID) | ORAL | Status: DC

## 2011-12-18 NOTE — Progress Notes (Signed)
  Echocardiogram 2D Echocardiogram has been performed.  Bridgit Eynon, Real Cons 12/18/2011, 4:23 PM

## 2011-12-18 NOTE — Assessment & Plan Note (Addendum)
Doing well from a functional standpoint - NYHA I-II. However volume status mildly elevated. Will restart Lasix 20 mg Monday-Wednesday -Friday . Increase Carvedilol 12.5 mg twice a day. I have reviewed ECHO results images personally and EF still about 30% - ? Minimally improved. Will continue to titrate meds and repeat echo in a couple months. If EF not improving may need to consider referral for ICD.

## 2011-12-18 NOTE — Patient Instructions (Signed)
Take Carvedilol 12.5 mg twice a day  Take Lasix 20 mg Monday-Wednesday-Friday  Follow up in 6 weeks.  Do the following things EVERYDAY: 1) Weigh yourself in the morning before breakfast. Write it down and keep it in a log. 2) Take your medicines as prescribed 3) Eat low salt foods-Limit salt (sodium) to 2000mg  per day.  4) Stay as active as you can everyday

## 2011-12-18 NOTE — Assessment & Plan Note (Signed)
Stable. Asymptomatic. Continue to follow.  

## 2011-12-28 NOTE — Progress Notes (Signed)
Patient ID: Joanna [name], female   DOB: [birth year], 78 y.o.   MRN: [id number] Patient ID: Joanna [name], female   DOB: November 28, 1929, 78 y.o.   MRN: [id number]  HPI:  Joanna [name] is a 78 year old woman with aortic stenosis, hypertension, diabetes mellitus type II, hyperlipidemia, and peripheral artery disease.  Admitted 12/12 with acute systolic HF, EF 30%. Cath with normal coronaries and normal cardiac output (see below). AS was mild to moderate. In hospital also found to be iron-deficient. Previous colonoscopies normal. Seen by GI who suggested trial of iron and see if it improved. If not, repeat endo. Stool guaiac was negative.   RA = 8 RV = 58/2/11 PA = 54/22 (37) PCW = 20 Fick cardiac output/index = 4.57/2.67 PVR = 3.7 Woods   D/C from hospital 12/ 3/ 2012. D/C weight 145 (admit weight 160) Admit due to progressive SOB. ARB/Beta blocker initiated and titrated.   12/18/11 ECHO 30-35% mod AS mean gradient 27  She returns for follow up today. Weight at home 142 pounds. Denies SOB/PND/Orthopnea/CP. She is taking lasix if her weight is 145-146. She has required one 20 mg Lasix. Working full time. Compliant with medication.    ROS: All systems negative except as listed in HPI, PMH and Problem List.  Past Medical History  Diagnosis Date  . HTN (hypertension)   . Diabetes mellitus   . Peripheral artery disease   . Hyperlipidemia   . Anemia   . PONV (postoperative nausea and vomiting)   . Shortness of breath   . CHF (congestive heart failure)   . Arthritis     Current Outpatient Prescriptions  Medication Sig Dispense Refill  . aspirin EC 81 MG tablet Take 81 mg by mouth daily.       . calcium citrate-vitamin D (CITRACAL+D) 315-200 MG-UNIT per tablet Take 2 tablets by mouth 2 (two) times daily.       . carvedilol (COREG) 6.25 MG tablet Take 2 tablets (12.5 mg total) by mouth 2 (two) times daily with a meal.  60 tablet  6  . cholecalciferol (VITAMIN D) 1000 UNITS tablet Take 2,000 Units by  mouth daily.        Marland Kitchen docusate sodium (COLACE) 100 MG capsule Take 100 mg by mouth 2 (two) times daily.        . ferrous sulfate 325 (65 FE) MG tablet Take 1 tablet (325 mg total) by mouth 2 (two) times daily with a meal.  180 tablet  1  . fish oil-omega-3 fatty acids 1000 MG capsule Take 1 g by mouth 2 (two) times daily.       . furosemide (LASIX) 40 MG tablet Take 0.5 tablets (20 mg total) by mouth every other day.  30 tablet  0  . Garlic 1000 MG CAPS Take 1 capsule by mouth daily.        Marland Kitchen losartan (COZAAR) 100 MG tablet Take 1 tablet (100 mg total) by mouth daily.  30 tablet  3  . metFORMIN (GLUCOPHAGE) 1000 MG tablet Take 1 tablet (1,000 mg total) by mouth 2 (two) times daily with a meal.  180 tablet  1  . pioglitazone (ACTOS) 45 MG tablet Take 1 tablet (45 mg total) by mouth daily.  90 tablet  3  . simvastatin (ZOCOR) 80 MG tablet Take 1 tablet (80 mg total) by mouth daily.  30 tablet  1     PHYSICAL EXAM: Filed Vitals:   12/18/11 1618  BP: 132/56  Pulse: 102   Wt 146 (145) General:  Well appearing. No resp difficulty HEENT: normal Neck: supple. JVP 10-11 + prominent CV waves Carotids 2+ bilaterally; no bruits. No lymphadenopathy or thryomegaly appreciated. Cor: PMI normal. Regular rate & rhythm. No rubs, gallops or 2/6 aortic murmur. Lungs: clear Abdomen: soft, nontender, nondistended. No hepatosplenomegaly. No bruits or masses. Good bowel sounds. Extremities: no cyanosis, clubbing, rash, trace-1+ edema Neuro: alert & orientedx3, cranial nerves grossly intact. Moves all 4 extremities w/o difficulty. Affect pleasant  ASSESSMENT & PLAN:

## 2011-12-28 NOTE — Assessment & Plan Note (Signed)
This is moderate by echo. Continue to follow.

## 2012-02-02 ENCOUNTER — Ambulatory Visit (HOSPITAL_COMMUNITY): Payer: Medicare Other

## 2012-02-05 ENCOUNTER — Ambulatory Visit (HOSPITAL_COMMUNITY)
Admission: RE | Admit: 2012-02-05 | Discharge: 2012-02-05 | Disposition: A | Discharge status: Home / Self Care | Payer: Medicare Other | Source: Ambulatory Visit | Attending: Internal Medicine | Admitting: Internal Medicine

## 2012-02-05 VITALS — BP 148/60 | HR 78 | Wt 145.8 lb

## 2012-02-05 DIAGNOSIS — I5022 Chronic systolic (congestive) heart failure: Secondary | ICD-10-CM

## 2012-02-05 LAB — BASIC METABOLIC PANEL
CO2: 31 mEq/L (ref 19–32)
Chloride: 102 mEq/L (ref 96–112)
Creatinine, Ser: 0.96 mg/dL (ref 0.50–1.10)
Glucose, Bld: 110 mg/dL — ABNORMAL HIGH (ref 70–99)

## 2012-02-05 MED ORDER — FUROSEMIDE 40 MG PO TABS: ORAL_TABLET | ORAL | Status: DC

## 2012-02-05 MED ORDER — CARVEDILOL 6.25 MG PO TABS: 18.7500 mg | ORAL_TABLET | Freq: Two times a day (BID) | ORAL | Status: DC

## 2012-02-05 NOTE — Assessment & Plan Note (Signed)
Functional status remains NYHA II despite mild volume overload on exam.  She will take 40/40 mg today and tomorrow then return to her alternating dose 40/40 with 40/20mg .  She will also increase her carvedilol 18.75 mg BID.  Will follow up in 3-4 weeks for further titration and then reschedule echo if she is able to tolerate titration.  Labs today.

## 2012-02-05 NOTE — Patient Instructions (Signed)
Increase carvedilol 2.5 tabs (18.75 mg) twice daily.  Increase lasix dose tomorrow (Friday) to 1 tab twice daily.  Follow up 3-4 weeks.    Do the following things EVERYDAY: 1) Weigh yourself in the morning before breakfast. Write it down and keep it in a log. 2) Take your medicines as prescribed 3) Eat low salt foods--Limit salt (sodium) to 2000mg  per day.  4) Stay as active as you can everyday

## 2012-02-05 NOTE — Progress Notes (Signed)
HPI:  Joanna [name] is a 78 year old woman with aortic stenosis, hypertension, diabetes mellitus type II, hyperlipidemia, and peripheral artery disease.   Admitted 12/12 with acute systolic HF, EF 30%. Cath with normal coronaries and normal cardiac output (see below). AS was mild to moderate. In hospital also found to be iron-deficient. Previous colonoscopies normal. Seen by GI who suggested trial of iron and see if it improved. If not, repeat endo. Stool guaiac was negative.   RA = 8 RV = 58/2/11 PA = 54/22 (37) PCW = 20 Fick cardiac output/index = 4.57/2.67 PVR = 3.7 Woods   D/C from hospital 12/ 3/ 2012. D/C weight 145 (admit weight 160) Admit due to progressive SOB. ARB/Beta blocker initiated and titrated.   12/18/11 ECHO 30-35% mod AS mean gradient 27  She returns for follow up today. She feels good.  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  Weight at home stable at 138-142 pounds.  Denies SOB/PND/Orthopnea/CP.  Walks 1/2 mile almost everyday.  Not requiring extra dose of lasix.  Compliant with medication.    ROS: All systems negative except as listed in HPI, PMH and Problem List.  Past Medical History  Diagnosis Date  . HTN (hypertension)   . Diabetes mellitus   . Peripheral artery disease   . Hyperlipidemia   . Anemia   . PONV (postoperative nausea and vomiting)   . Shortness of breath   . CHF (congestive heart failure)   . Arthritis   . Aortic stenosis 09/21/2011  . AORTIC VALVE SCLEROSIS 02/21/2010  . 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 BUNDLE BRANCH BLOCK, LEFT 07/30/2007  . CAROTID ARTERY STENOSIS 01/05/2009  . CVA 03/14/2010  . DM 07/30/2007  . Edema 04/07/2008  . Herpes zoster without mention of complication 09/29/2007  . 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 HYPERLIPIDEMIA 07/30/2007  . HYPERTENSION 07/30/2007  . Iron deficiency anemia 09/21/2011  . PERIPHERAL VASCULAR DISEASE 07/30/2007  . POLYPECTOMY, HX OF 07/30/2007  . PULMONARY HYPERTENSION  03/23/2009  . SKIN RASH, ALLERGIC 04/07/2008     Current Outpatient Prescriptions  Medication Sig Dispense Refill  . aspirin EC 81 MG tablet Take 81 mg by mouth daily.       . calcium citrate-vitamin D (CITRACAL+D) 315-200 MG-UNIT per tablet Take 2 tablets by mouth 2 (two) times daily.       . carvedilol (COREG) 6.25 MG tablet Take 2 tablets (12.5 mg total) by mouth 2 (two) times daily with a meal.  60 tablet  6  . cholecalciferol (VITAMIN D) 1000 UNITS tablet Take 2,000 Units by mouth daily.        Marland Kitchen docusate sodium (COLACE) 100 MG capsule Take 100 mg by mouth 2 (two) times daily.        . ferrous sulfate 325 (65 FE) MG tablet Take 1 tablet (325 mg total) by mouth 2 (two) times daily with a meal.  180 tablet  1  . fish oil-omega-3 fatty acids 1000 MG capsule Take 1 g by mouth 2 (two) times daily.       . furosemide (LASIX) 40 MG tablet Take 1 tab every Sun, Tue, Thur and Sat and 1/2 tab every Mon, Wed and Fri.  Can take 1 tab on Mon, Wed and Fri if your wt is up 3 lbs  90 tablet  3  . Garlic 1000 MG CAPS Take 1 capsule by mouth daily.        Marland Kitchen losartan (COZAAR) 100 MG tablet  Take 1 tablet (100 mg total) by mouth daily.  30 tablet  3  . metFORMIN (GLUCOPHAGE) 1000 MG tablet Take 1 tablet (1,000 mg total) by mouth 2 (two) times daily with a meal.  180 tablet  1  . pioglitazone (ACTOS) 45 MG tablet Take 1 tablet (45 mg total) by mouth daily.  90 tablet  3  . simvastatin (ZOCOR) 80 MG tablet Take 1 tablet (80 mg total) by mouth daily.  30 tablet  1  . DISCONTD: furosemide (LASIX) 40 MG tablet Take 0.5 tablets (20 mg total) by mouth every other day.  30 tablet  0  . DISCONTD: furosemide (LASIX) 40 MG tablet Take 1 tab every Sun, Tue, Thur and Sat and 1/2 tab every Mon, Wed and Fri.  Can take 1 tab on Mon, Wed and Fri if your wt is up 3 lbs      . DISCONTD: furosemide (LASIX) 40 MG tablet Take 1 tablet (40 mg total) by mouth daily.  30 tablet  3     PHYSICAL EXAM: Filed Vitals:   02/05/12 1007    BP: 148/60  Pulse: 78  Weight: 145 lb 12 oz (66.112 kg)  SpO2: 95%    General:  Well appearing. No resp difficulty HEENT: normal Neck: supple. JVP 10-11 + prominent CV waves Carotids 2+ bilaterally; no bruits. No lymphadenopathy or thryomegaly appreciated. Cor: PMI normal. Regular rate & rhythm. No rubs, gallops or 2/6 aortic murmur. Lungs: clear Abdomen: soft, nontender, nondistended. No hepatosplenomegaly. No bruits or masses. Good bowel sounds. Extremities: no cyanosis, clubbing, rash, trace-1+ edema Neuro: alert & orientedx3, cranial nerves grossly intact. Moves all 4 extremities w/o difficulty. Affect pleasant  ASSESSMENT & PLAN:

## 2012-02-12 ENCOUNTER — Other Ambulatory Visit: Payer: Self-pay | Admitting: Physician Assistant

## 2012-02-26 ENCOUNTER — Encounter (HOSPITAL_COMMUNITY): Payer: Medicare Other

## 2012-03-04 ENCOUNTER — Encounter (HOSPITAL_COMMUNITY): Payer: Self-pay

## 2012-03-04 ENCOUNTER — Ambulatory Visit (HOSPITAL_COMMUNITY)
Admission: RE | Admit: 2012-03-04 | Discharge: 2012-03-04 | Disposition: A | Discharge status: Home / Self Care | Payer: Medicare Other | Source: Ambulatory Visit | Attending: Internal Medicine | Admitting: Internal Medicine

## 2012-03-04 VITALS — BP 128/56 | HR 74 | Ht 64.0 in | Wt 145.8 lb

## 2012-03-04 DIAGNOSIS — I350 Nonrheumatic aortic (valve) stenosis: Secondary | ICD-10-CM

## 2012-03-04 DIAGNOSIS — I359 Nonrheumatic aortic valve disorder, unspecified: Secondary | ICD-10-CM

## 2012-03-04 DIAGNOSIS — I5022 Chronic systolic (congestive) heart failure: Secondary | ICD-10-CM | POA: Insufficient documentation

## 2012-03-04 NOTE — Patient Instructions (Addendum)
Follow up in 2 month   Please schedule ECHO in next 2 weeks   Do the following things EVERYDAY: 1) Weigh yourself in the morning before breakfast. Write it down and keep it in a log. 2) Take your medicines as prescribed 3) Eat low salt foods--Limit salt (sodium) to 2000 mg per day.  4) Stay as active as you can everyday 5) Limit all fluids for the day to less than 2 liters

## 2012-03-04 NOTE — Assessment & Plan Note (Addendum)
She continues to do quite well. NYHA II. She remains very active working and walking several times per week. She did not tolerate up titration of carvedilol due to fatigue. Volume status is stable on current diuretics. Will repeat ECHO in the next couple of weeks to evaluate EF and aortic valve. If EF remains reduced will need to consider referral for possible ICD. Follow up in 2-3 months.

## 2012-03-20 NOTE — Progress Notes (Signed)
Patient ID: Joanna [name], female   DOB: 1929/11/17, 78 y.o.   MRN: [id number]  HPI:  Ms. Towle is a 78 year old woman with aortic stenosis, hypertension, diabetes mellitus type II, hyperlipidemia, and peripheral artery disease.   Admitted 76/12 with acute systolic HF, EF 30%. Cath with normal coronaries and normal cardiac output (see below). AS was mild to moderate. In hospital also found to be iron-deficient. Previous colonoscopies normal. Seen by GI who suggested trial of iron and see if it improved. If not, repeat endo. Stool guaiac was negative.   RA = 8 RV = 58/2/11 PA = 54/22 (37) PCW = 20 Fick cardiac output/index = 4.57/2.67 PVR = 3.7 Woods   D/C from hospital 12/ 3/ 2012. D/C weight 145 (admit weight 160) Admit due to progressive SOB. ARB/Beta blocker initiated and titrated.   12/18/11 ECHO 30-35% mod AS mean gradient 27  She returns for follow up. Last visit carvedilol increased to 18.75 mg bid and instructed to alternate Lasix 40 mg and 20 mg the next. She did not tolerate carvedilol 18.75 mg and stopped taking it after one dose. Now back to 12.5 bid.  Denies SOB/PND/Orthopnea. She walks 1/2-1 mile per week. 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 Compliant with medicaitons. Weighing routinely.  ROS: All systems negative except as listed in HPI, PMH and Problem List.  Past Medical History  Diagnosis Date  . HTN (hypertension)   . Diabetes mellitus   . Peripheral artery disease   . Hyperlipidemia   . Anemia   . PONV (postoperative nausea and vomiting)   . Shortness of breath   . CHF (congestive heart failure)   . Arthritis   . Aortic stenosis 09/21/2011  . AORTIC VALVE SCLEROSIS 02/21/2010  . 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 BUNDLE BRANCH BLOCK, LEFT 07/30/2007  . CAROTID ARTERY STENOSIS 01/05/2009  . CVA 03/14/2010  . DM 07/30/2007  . Edema 04/07/2008  . Herpes zoster without mention of complication 09/29/2007  . 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  HYPERLIPIDEMIA 07/30/2007  . HYPERTENSION 07/30/2007  . Iron deficiency anemia 09/21/2011  . PERIPHERAL VASCULAR DISEASE 07/30/2007  . POLYPECTOMY, HX OF 07/30/2007  . PULMONARY HYPERTENSION 03/23/2009  . SKIN RASH, ALLERGIC 04/07/2008     Current Outpatient Prescriptions  Medication Sig Dispense Refill  . aspirin EC 81 MG tablet Take 81 mg by mouth daily.       . calcium citrate-vitamin D (CITRACAL+D) 315-200 MG-UNIT per tablet Take 2 tablets by mouth 2 (two) times daily.       . carvedilol (COREG) 6.25 MG tablet Take 3 tablets (18.75 mg total) by mouth 2 (two) times daily with a meal.  60 tablet  6  . cholecalciferol (VITAMIN D) 1000 UNITS tablet Take 2,000 Units by mouth daily.        Marland Kitchen docusate sodium (COLACE) 100 MG capsule Take 100 mg by mouth 2 (two) times daily.        . ferrous sulfate 325 (65 FE) MG tablet Take 1 tablet (325 mg total) by mouth 2 (two) times daily with a meal.  180 tablet  1  . fish oil-omega-3 fatty acids 1000 MG capsule Take 1 g by mouth 2 (two) times daily.       . furosemide (LASIX) 40 MG tablet Take 1 tab every Sun, Tue, Thur and Sat and 1/2 tab every Mon, Wed and Fri.  Can take 1 tab on Mon, Wed and Fri if your wt is  up 3 lbs  90 tablet  3  . Garlic 1000 MG CAPS Take 1 capsule by mouth daily.        Marland Kitchen losartan (COZAAR) 100 MG tablet Take 1 tablet (100 mg total) by mouth daily.  30 tablet  3  . metFORMIN (GLUCOPHAGE) 1000 MG tablet Take 1 tablet (1,000 mg total) by mouth 2 (two) times daily with a meal.  180 tablet  1  . pioglitazone (ACTOS) 45 MG tablet Take 1 tablet (45 mg total) by mouth daily.  90 tablet  3  . simvastatin (ZOCOR) 80 MG tablet Take 1 tablet (80 mg total) by mouth daily.  30 tablet  1  . DISCONTD: furosemide (LASIX) 40 MG tablet Take 1 tablet (40 mg total) by mouth daily.  30 tablet  3     PHYSICAL EXAM: Filed Vitals:   03/04/12 1520  BP: 128/56  Pulse: 74  Height: 5\' 4"  (1.626 m)  Weight: 145 lb 12.8 oz (66.134 kg)  SpO2: 98%   145  (145 pounds)  General:  Well appearing. No resp difficulty HEENT: normal Neck: supple. JVP prominent CV waves Carotids 2+ bilaterally; no bruits. No lymphadenopathy or thryomegaly appreciated. Cor: PMI normal. Regular rate & rhythm. No rubs, gallops or 2/6 aortic murmur. Lungs: clear Abdomen: soft, nontender, nondistended. No hepatosplenomegaly. No bruits or masses. Good bowel sounds. Extremities: no cyanosis, clubbing, rash, trace-1+ edema Neuro: alert & orientedx3, cranial nerves grossly intact. Moves all 4 extremities w/o difficulty. Affect pleasant  ASSESSMENT & PLAN:

## 2012-03-20 NOTE — Assessment & Plan Note (Signed)
This is in moderate range. Will continue to follow.

## 2012-03-26 ENCOUNTER — Telehealth (HOSPITAL_COMMUNITY): Payer: Self-pay | Admitting: *Deleted

## 2012-03-26 ENCOUNTER — Ambulatory Visit (HOSPITAL_COMMUNITY)
Admission: RE | Admit: 2012-03-26 | Discharge: 2012-03-26 | Disposition: A | Discharge status: Home / Self Care | Payer: Medicare Other | Source: Ambulatory Visit | Attending: Internal Medicine | Admitting: Internal Medicine

## 2012-03-26 DIAGNOSIS — I509 Heart failure, unspecified: Secondary | ICD-10-CM | POA: Insufficient documentation

## 2012-03-26 DIAGNOSIS — I5022 Chronic systolic (congestive) heart failure: Secondary | ICD-10-CM

## 2012-03-26 DIAGNOSIS — I059 Rheumatic mitral valve disease, unspecified: Secondary | ICD-10-CM | POA: Insufficient documentation

## 2012-03-26 MED ORDER — FUROSEMIDE 40 MG PO TABS: ORAL_TABLET | ORAL | Status: DC

## 2012-03-26 NOTE — Telephone Encounter (Signed)
Refill called in for Lasix.  Joanna [name] was was appreciative of the call.

## 2012-03-26 NOTE — Progress Notes (Signed)
  Echocardiogram 2D Echocardiogram has been performed.  Joanna [name] 03/26/2012, 12:21 PM

## 2012-03-26 NOTE — Telephone Encounter (Signed)
Joanna [name] was in today, she needs a refill called in for her furosemide (lasix) to [facility].  She is out of refills. You can leave a message on her home phone number to let her know you called it in. Thanks.

## 2012-03-30 ENCOUNTER — Other Ambulatory Visit: Payer: Self-pay | Admitting: Physician Assistant

## 2012-04-26 ENCOUNTER — Other Ambulatory Visit (HOSPITAL_COMMUNITY): Payer: Self-pay | Admitting: Adult Health

## 2012-04-26 MED ORDER — FUROSEMIDE 40 MG PO TABS: ORAL_TABLET | ORAL | Status: DC

## 2012-05-13 ENCOUNTER — Ambulatory Visit (HOSPITAL_COMMUNITY)
Admission: RE | Admit: 2012-05-13 | Discharge: 2012-05-13 | Disposition: A | Discharge status: Home / Self Care | Payer: Medicare Other | Source: Ambulatory Visit | Attending: Internal Medicine | Admitting: Internal Medicine

## 2012-05-13 ENCOUNTER — Encounter (HOSPITAL_COMMUNITY): Payer: Self-pay

## 2012-05-13 VITALS — BP 140/78 | HR 80 | Ht 62.0 in | Wt 142.1 lb

## 2012-05-13 DIAGNOSIS — I350 Nonrheumatic aortic (valve) stenosis: Secondary | ICD-10-CM

## 2012-05-13 DIAGNOSIS — I5022 Chronic systolic (congestive) heart failure: Secondary | ICD-10-CM

## 2012-05-13 DIAGNOSIS — I359 Nonrheumatic aortic valve disorder, unspecified: Secondary | ICD-10-CM

## 2012-05-13 NOTE — Assessment & Plan Note (Addendum)
Moderate. Asymptomatic. Continue to follow.

## 2012-05-13 NOTE — Assessment & Plan Note (Addendum)
Doing well NYHA II. Volume status stable. Unable to titrate carvedilol due to fatigue. Will continue current regimen. ECHO results reviewed during office visit. EF 35%. She is very reluctant about ICD. Will repeat ECHO in 3 month. If EF< 35 % will need to readdress appropriateness/referral for ICD. Follow up in 3 months.

## 2012-05-13 NOTE — Patient Instructions (Addendum)
Follow up in 3 months with an ECHO  Do the following things EVERYDAY: 1) Weigh yourself in the morning before breakfast. Write it down and keep it in a log. 2) Take your medicines as prescribed 3) Eat low salt foods-Limit salt (sodium) to 2000 mg per day.  4) Stay as active as you can everyday 5) Limit all fluids for the day to less than 2 liters 

## 2012-05-16 NOTE — Progress Notes (Signed)
Patient ID: Joanna [name], female   DOB: [birth year], 78 y.o.   MRN: [id number]  HPI:  Joanna [name] is a 78 year old woman with aortic stenosis, hypertension, diabetes mellitus type II, hyperlipidemia, and peripheral artery disease.   Admitted 12/12 with acute systolic HF, EF 30%. Cath with normal coronaries and normal cardiac output (see below). AS was mild to moderate. In hospital also found to be iron-deficient. Previous colonoscopies normal. Seen by GI who suggested trial of iron and see if it improved. If not, repeat endo. Stool guaiac was negative.   RA = 8 RV = 58/2/11 PA = 54/22 (37) PCW = 20 Fick cardiac output/index = 4.57/2.67 PVR = 3.7 Woods   D/C from hospital 12/ 3/ 2012. D/C weight 145 (admit weight 160) Admit due to progressive SOB. ARB/Beta blocker initiated and titrated.   12/18/11 ECHO 30-35% mod AS mean gradient 27 03/26/12 ECHO 35% mod AS mean gradient 27  Unable to tolerate up titration of carvedilol due to fatigue.   She returns for follow up. Feels great. Denies SOB/PND/Orthopnea. Walks 15 minutes per day. Continues to work full time. Compliant with medications.   ROS: All systems negative except as listed in HPI, PMH and Problem List.  Past Medical History  Diagnosis Date  . HTN (hypertension)   . Diabetes mellitus   . Peripheral artery disease   . Hyperlipidemia   . Anemia   . PONV (postoperative nausea and vomiting)   . Shortness of breath   . CHF (congestive heart failure)   . Arthritis   . Aortic stenosis 09/21/2011  . AORTIC VALVE SCLEROSIS 02/21/2010  . 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 BUNDLE BRANCH BLOCK, LEFT 07/30/2007  . CAROTID ARTERY STENOSIS 01/05/2009  . CVA 03/14/2010  . DM 07/30/2007  . Edema 04/07/2008  . Herpes zoster without mention of complication 09/29/2007  . 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 HYPERLIPIDEMIA 07/30/2007  . HYPERTENSION 07/30/2007  . Iron deficiency anemia 09/21/2011  . PERIPHERAL VASCULAR DISEASE 07/30/2007  . POLYPECTOMY,  HX OF 07/30/2007  . PULMONARY HYPERTENSION 03/23/2009  . SKIN RASH, ALLERGIC 04/07/2008     Current Outpatient Prescriptions  Medication Sig Dispense Refill  . aspirin EC 81 MG tablet Take 81 mg by mouth daily.       . calcium citrate-vitamin D (CITRACAL+D) 315-200 MG-UNIT per tablet Take 2 tablets by mouth 2 (two) times daily.       . carvedilol (COREG) 6.25 MG tablet Take 6.25 mg by mouth 2 (two) times daily with a meal. Take 2 tabs bid      . cholecalciferol (VITAMIN D) 1000 UNITS tablet Take 2,000 Units by mouth daily.        Marland Kitchen docusate sodium (COLACE) 100 MG capsule Take 100 mg by mouth 2 (two) times daily.        . ferrous sulfate 325 (65 FE) MG tablet Take 1 tablet (325 mg total) by mouth 2 (two) times daily with a meal.  180 tablet  1  . fish oil-omega-3 fatty acids 1000 MG capsule Take 1 g by mouth 2 (two) times daily.       . furosemide (LASIX) 40 MG tablet Take 1 tab every Sun, Tue, Thur and Sat and 1/2 tab every Mon, Wed and Fri.  Can take 1 tab on Mon, Wed and Fri if your wt is up 3 lbs  90 tablet  6  . Garlic 1000 MG CAPS Take 1 capsule by mouth daily.        Marland Kitchen  losartan (COZAAR) 100 MG tablet Take 1 tablet (100 mg total) by mouth daily.  30 tablet  3  . metFORMIN (GLUCOPHAGE) 1000 MG tablet Take 1 tablet (1,000 mg total) by mouth 2 (two) times daily with a meal.  180 tablet  1  . pioglitazone (ACTOS) 45 MG tablet Take 1 tablet (45 mg total) by mouth daily.  90 tablet  3  . simvastatin (ZOCOR) 80 MG tablet Take 1 tablet (80 mg total) by mouth daily.  30 tablet  1  . DISCONTD: furosemide (LASIX) 40 MG tablet Take 1 tablet (40 mg total) by mouth daily.  30 tablet  3     PHYSICAL EXAM: Filed Vitals:   05/13/12 1554  BP: 140/78  Pulse: 80  Height: 5\' 2"  (1.575 m)  Weight: 142 lb 1.9 oz (64.465 kg)  SpO2: 99%   142 (145) General:  Well appearing. No resp difficulty HEENT: normal Neck: supple. JVP 6-7 prominent CV waves Carotids 2+ bilaterally; no bruits. No lymphadenopathy  or thryomegaly appreciated. Cor: PMI normal. Regular rate & rhythm. No rubs, gallops or 2/6 aortic murmur. Lungs: clear Abdomen: soft, nontender, nondistended. No hepatosplenomegaly. No bruits or masses. Good bowel sounds. Extremities: no cyanosis, clubbing, rash, trace-1+ edema Neuro: alert & orientedx3, cranial nerves grossly intact. Moves all 4 extremities w/o difficulty. Affect pleasant  ASSESSMENT & PLAN:

## 2012-06-18 ENCOUNTER — Other Ambulatory Visit: Payer: Self-pay | Admitting: Internal Medicine

## 2012-06-18 DIAGNOSIS — Z1231 Encounter for screening mammogram for malignant neoplasm of breast: Secondary | ICD-10-CM

## 2012-07-23 ENCOUNTER — Ambulatory Visit
Admission: RE | Admit: 2012-07-23 | Discharge: 2012-07-23 | Disposition: A | Discharge status: Home / Self Care | Payer: Medicare Other | Source: Ambulatory Visit | Attending: Internal Medicine | Admitting: Internal Medicine

## 2012-07-23 DIAGNOSIS — Z1231 Encounter for screening mammogram for malignant neoplasm of breast: Secondary | ICD-10-CM

## 2012-08-05 ENCOUNTER — Other Ambulatory Visit: Payer: Self-pay

## 2012-08-05 MED ORDER — PIOGLITAZONE HCL 45 MG PO TABS: 45.0000 mg | ORAL_TABLET | Freq: Every day | ORAL | Status: DC

## 2012-08-05 MED ORDER — LOSARTAN POTASSIUM 100 MG PO TABS: 100.0000 mg | ORAL_TABLET | Freq: Every day | ORAL | Status: DC

## 2012-08-05 MED ORDER — SIMVASTATIN 80 MG PO TABS: 80.0000 mg | ORAL_TABLET | Freq: Every day | ORAL | Status: DC

## 2012-08-05 MED ORDER — METFORMIN HCL 1000 MG PO TABS: 1000.0000 mg | ORAL_TABLET | Freq: Two times a day (BID) | ORAL | Status: DC

## 2012-08-05 MED ORDER — FUROSEMIDE 40 MG PO TABS: ORAL_TABLET | ORAL | Status: DC

## 2012-08-05 MED ORDER — CARVEDILOL 6.25 MG PO TABS: 6.2500 mg | ORAL_TABLET | Freq: Two times a day (BID) | ORAL | Status: DC

## 2012-08-12 ENCOUNTER — Other Ambulatory Visit: Payer: Self-pay | Admitting: *Deleted

## 2012-08-13 ENCOUNTER — Other Ambulatory Visit: Payer: Self-pay | Admitting: *Deleted

## 2012-08-13 MED ORDER — CARVEDILOL 6.25 MG PO TABS: ORAL_TABLET | ORAL | Status: DC

## 2012-08-19 ENCOUNTER — Other Ambulatory Visit: Payer: Self-pay | Admitting: *Deleted

## 2012-08-19 MED ORDER — CARVEDILOL 6.25 MG PO TABS: ORAL_TABLET | ORAL | Status: DC

## 2012-08-20 ENCOUNTER — Telehealth (HOSPITAL_COMMUNITY): Payer: Self-pay | Admitting: Cardiology

## 2012-08-20 NOTE — Telephone Encounter (Signed)
Requested refills on potassium. Med is not on medication list  Ok to refill?

## 2012-08-20 NOTE — Telephone Encounter (Signed)
Request refills

## 2012-08-23 NOTE — Telephone Encounter (Signed)
Potassium was stopped in Dec, have called pt and Left message to call back

## 2012-08-26 NOTE — Telephone Encounter (Signed)
Labs reviewed by RN, will forward to MD for review

## 2012-09-06 NOTE — Telephone Encounter (Signed)
Still unable to reach pt, have left multiple messages to call back

## 2012-10-06 NOTE — Telephone Encounter (Signed)
Have never heard back from pt after many messages left

## 2012-10-21 ENCOUNTER — Encounter: Payer: Self-pay | Admitting: Internal Medicine

## 2012-11-15 ENCOUNTER — Other Ambulatory Visit (INDEPENDENT_AMBULATORY_CARE_PROVIDER_SITE_OTHER): Payer: Medicare Other

## 2012-11-15 ENCOUNTER — Ambulatory Visit (INDEPENDENT_AMBULATORY_CARE_PROVIDER_SITE_OTHER): Payer: Medicare Other | Admitting: Internal Medicine

## 2012-11-15 ENCOUNTER — Encounter: Payer: Self-pay | Admitting: Internal Medicine

## 2012-11-15 VITALS — BP 122/70 | HR 80 | Temp 98.5°F | Resp 10 | Ht 63.0 in | Wt 140.1 lb

## 2012-11-15 DIAGNOSIS — D509 Iron deficiency anemia, unspecified: Secondary | ICD-10-CM

## 2012-11-15 DIAGNOSIS — E119 Type 2 diabetes mellitus without complications: Secondary | ICD-10-CM

## 2012-11-15 DIAGNOSIS — E785 Hyperlipidemia, unspecified: Secondary | ICD-10-CM

## 2012-11-15 DIAGNOSIS — Z0000 Encounter for general adult medical examination without abnormal findings: Secondary | ICD-10-CM

## 2012-11-15 DIAGNOSIS — I6529 Occlusion and stenosis of unspecified carotid artery: Secondary | ICD-10-CM

## 2012-11-15 DIAGNOSIS — I10 Essential (primary) hypertension: Secondary | ICD-10-CM

## 2012-11-15 DIAGNOSIS — Z23 Encounter for immunization: Secondary | ICD-10-CM

## 2012-11-15 DIAGNOSIS — I5022 Chronic systolic (congestive) heart failure: Secondary | ICD-10-CM

## 2012-11-15 LAB — COMPREHENSIVE METABOLIC PANEL
CO2: 33 mEq/L — ABNORMAL HIGH (ref 19–32)
Calcium: 10.1 mg/dL (ref 8.4–10.5)
Chloride: 97 mEq/L (ref 96–112)
GFR: 48.22 mL/min — ABNORMAL LOW (ref >60.00)
Glucose, Bld: 147 mg/dL — ABNORMAL HIGH (ref 70–99)
Sodium: 139 mEq/L (ref 135–145)
Total Bilirubin: 0.6 mg/dL (ref 0.3–1.2)
Total Protein: 6.8 g/dL (ref 6.0–8.3)

## 2012-11-15 LAB — LIPID PANEL
Cholesterol: 207 mg/dL — ABNORMAL HIGH (ref 0–200)
Triglycerides: 138 mg/dL (ref 0.0–149.0)

## 2012-11-15 LAB — HEPATIC FUNCTION PANEL
AST: 18 U/L (ref 0–37)
Total Bilirubin: 0.6 mg/dL (ref 0.3–1.2)

## 2012-11-15 LAB — IBC PANEL: Saturation Ratios: 6 % — ABNORMAL LOW (ref 20.0–50.0)

## 2012-11-15 LAB — LDL CHOLESTEROL, DIRECT: Direct LDL: 110.1 mg/dL

## 2012-11-15 MED ORDER — FUROSEMIDE 40 MG PO TABS: ORAL_TABLET | ORAL | Status: DC

## 2012-11-15 MED ORDER — CARVEDILOL 6.25 MG PO TABS: ORAL_TABLET | ORAL | Status: DC

## 2012-11-15 MED ORDER — INFLUENZA VIRUS VACC SPLIT PF IM SUSP: 0.5000 mL | INTRAMUSCULAR | Status: AC

## 2012-11-15 NOTE — Patient Instructions (Addendum)
Thanks for coming in. All looks well  Lab today and a report will be sent to you.  You should be hearing from Dr. Gala Romney   You should have the shingles vaccine - check with your pharmacist about cost  See you next year.

## 2012-11-15 NOTE — Progress Notes (Signed)
Subjective:    Patient ID: Joanna [name], female    DOB: 10/14/1930, 79 y.o.   MRN: [id number]  HPI The patient is here for annual Medicare wellness examination and management of other chronic and acute problems. Interval history is negative for any major medical illness, surgery or injury.    The risk factors are reflected in the social history.  The roster of all physicians providing medical care to patient - is listed in the Snapshot section of the chart.  Activities of daily living:  The patient is 100% inedpendent in all ADLs: dressing, toileting, feeding as well as independent mobility  Home safety : The patient does not have smoke detectors in the home. Falls - no falls. Home is fall safe.  They wear seatbelts. No firearms at home  There is no risks for hepatitis, STDs or HIV. There is no   history of blood transfusion. They have no travel history to infectious disease endemic areas of the world.  The patient has seen their dentist in the last six month. They have seen their eye doctor in the last year. They deny any hearing difficulty and have not had audiologic testing in the last year.    They do not  have excessive sun exposure. Discussed the need for sun protection: hats, long sleeves and use of sunscreen if there is significant sun exposure.   Diet: the importance of a healthy diet is discussed. They do have a healthy diet.  The patient has a regular exercise program: walks daily , 20 duration,  5 per week.  The benefits of regular aerobic exercise were discussed.  Depression screen: there are no signs or vegative symptoms of depression- irritability, change in appetite, anhedonia, sadness/tearfullness.  Cognitive assessment: the patient manages all their financial and personal affairs and is actively engaged.   The following portions of the patient's history were reviewed and updated as appropriate: allergies, current medications, past family history, past medical history,   past surgical history, past social history  and problem list.  Past Medical History  Diagnosis Date  . HTN (hypertension)   . Diabetes mellitus   . Peripheral artery disease   . Hyperlipidemia   . Anemia   . PONV (postoperative nausea and vomiting)   . Shortness of breath   . CHF (congestive heart failure)   . Arthritis   . Aortic stenosis 09/21/2011  . AORTIC VALVE SCLEROSIS 02/21/2010  . 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 BUNDLE BRANCH BLOCK, LEFT 07/30/2007  . CAROTID ARTERY STENOSIS 01/05/2009  . CVA 03/14/2010  . DM 07/30/2007  . Edema 04/07/2008  . Herpes zoster without mention of complication 09/29/2007  . 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 HYPERLIPIDEMIA 07/30/2007  . HYPERTENSION 07/30/2007  . Iron deficiency anemia 09/21/2011  . PERIPHERAL VASCULAR DISEASE 07/30/2007  . POLYPECTOMY, HX OF 07/30/2007  . PULMONARY HYPERTENSION 03/23/2009  . SKIN RASH, ALLERGIC 04/07/2008   Past Surgical History  Procedure Date  . Carotid endarterectomy   . Polypectomy 12/27/04  . Cataract extraction 2009   Family History  Problem Relation Age of Onset  . Colon cancer Mother   . Diabetes Neg Hx   . Coronary artery disease Neg Hx    History   Social History  . Marital Status: Divorced    Spouse Name: N/A    Number of Children: N/A  . Years of Education: N/A   Occupational History  . Not on file.   Social History Main Topics  . Smoking status: Never Smoker   .  Smokeless tobacco: Never Used  . Alcohol Use: No  . Drug Use: No  . Sexually Active: Not Currently    Birth Control/ Protection: Post-menopausal   Other Topics Concern  . Not on file   Social History Narrative   Patient lives alone here [place] townShe continues to work, helping to bind and oversew booksShe is divorced after 23 years of marriage1 son- '61, minister; 1 dtr '55; 4 grandchildrenEnd of life care-provided packet on living well    Current Outpatient Prescriptions on File Prior to Visit  Medication Sig Dispense  Refill  . aspirin EC 81 MG tablet Take 81 mg by mouth daily.       . calcium citrate-vitamin D (CITRACAL+D) 315-200 MG-UNIT per tablet Take 2 tablets by mouth 2 (two) times daily.       . carvedilol (COREG) 6.25 MG tablet Take 2 tablets 2 x daily. In the morning and afternoon  120 tablet  3  . cholecalciferol (VITAMIN D) 1000 UNITS tablet Take 2,000 Units by mouth daily.        Marland Kitchen docusate sodium (COLACE) 100 MG capsule Take 100 mg by mouth 2 (two) times daily.        . ferrous sulfate 325 (65 FE) MG tablet Take 325 mg by mouth 2 (two) times daily with a meal.      . fish oil-omega-3 fatty acids 1000 MG capsule Take 1 g by mouth 2 (two) times daily.       . furosemide (LASIX) 40 MG tablet Take 1 tab every Sun, Tue, Thur and Sat and 1/2 tab every Mon, Wed and Fri.  Can take 1 tab on Mon, Wed and Fri if your wt is up 3 lbs  90 tablet  1  . Garlic 1000 MG CAPS Take 1 capsule by mouth daily.        Marland Kitchen losartan (COZAAR) 100 MG tablet Take 1 tablet (100 mg total) by mouth daily.  90 tablet  1  . metFORMIN (GLUCOPHAGE) 1000 MG tablet Take 1 tablet (1,000 mg total) by mouth 2 (two) times daily with a meal.  180 tablet  1  . pioglitazone (ACTOS) 45 MG tablet Take 1 tablet (45 mg total) by mouth daily.  90 tablet  1  . simvastatin (ZOCOR) 80 MG tablet Take 1 tablet (80 mg total) by mouth daily.  90 tablet  1     Vision, hearing, body mass index were assessed and reviewed.   During the course of the visit the patient was educated and counseled about appropriate screening and preventive services including : fall prevention , diabetes screening, nutrition counseling, colorectal cancer screening, and recommended immunizations.    Review of Systems Constitutional:  Negative for fever, chills, activity change and unexpected weight change.  HEENT:  Negative for hearing loss, ear pain, congestion, neck stiffness and postnasal drip. Negative for sore throat or swallowing problems. Negative for dental complaints.    Eyes: Negative for vision loss or change in visual acuity.  Respiratory: Negative for chest tightness and wheezing. Negative for DOE.   Cardiovascular: Negative for chest pain or palpitations. No decreased exercise tolerance Gastrointestinal: No change in bowel habit. No bloating or gas. No reflux or indigestion Genitourinary: Negative for urgency, frequency, flank pain and difficulty urinating.  Musculoskeletal: Negative for myalgias, back pain, arthralgias and gait problem.  Neurological: Negative for dizziness, tremors, weakness and headaches.  Hematological: Negative for adenopathy.  Psychiatric/Behavioral: Negative for behavioral problems and dysphoric mood.  Objective:   Physical Exam Filed Vitals:   11/15/12 1322  BP: 122/70  Pulse: 80  Temp: 98.5 F (36.9 C)  Resp: 10   Wt Readings from Last 3 Encounters:  11/15/12 140 lb 1.3 oz (63.54 kg)  05/13/12 142 lb 1.9 oz (64.465 kg)  03/04/12 145 lb 12.8 oz (66.134 kg)   Gen'l: well nourished, well developed white Woman in no distress HEENT - [place]/AT, EACs/TMs normal, oropharynx with native dentition in good condition, no buccal or palatal lesions, posterior pharynx clear, mucous membranes moist. C&S clear, PERRLA, fundi - normal Neck - supple, no thyromegaly Nodes- negative submental, cervical, supraclavicular regions Chest - no deformity, no CVAT Lungs - clear without rales, wheezes. No increased work of breathing Breast - deferred to Dr. Nicholas Lose Cardiovascular - regular rate and rhythm, quiet precordium, III/VI systolic murmur RSB, II/VI high pitched murmur best at LSB and apex, no rubs or gallops, 2+ radial, DP and PT pulses Abdomen - BS+ x 4, no HSM, no guarding or rebound or tenderness Pelvic - deferred to gyn Rectal - deferred to gyn Extremities - no clubbing, cyanosis, edema or deformity.  Neuro - A&O x 3, CN II-XII normal, motor strength normal and equal, DTRs 2+ and symmetrical biceps, radial, and patellar  tendons. Cerebellar - no tremor, no rigidity, fluid movement and normal gait. Derm - Head, neck, back, abdomen and extremities without suspicious lesions  Lab Results  Component Value Date   WBC 6.9 11/15/2012   HGB 7.7 Repeated and verified X2.* 11/15/2012   HCT 23.4 Repeated and verified X2.* 11/15/2012   PLT 224.0 11/15/2012   GLUCOSE 147* 11/15/2012   CHOL 207* 11/15/2012   TRIG 138.0 11/15/2012   HDL 66.00 11/15/2012   LDLDIRECT 110.1 11/15/2012   LDLCALC 75 11/15/2010   ALT 11 11/15/2012   ALT 11 11/15/2012   AST 18 11/15/2012   AST 18 11/15/2012   NA 139 11/15/2012   K 4.2 11/15/2012   CL 97 11/15/2012   CREATININE 1.2 11/15/2012   BUN 37* 11/15/2012   CO2 33* 11/15/2012   TSH 1.47 10/26/2009   INR 1.12 09/17/2011   HGBA1C 6.9* 11/15/2012          Assessment & Plan:

## 2012-11-19 ENCOUNTER — Telehealth: Payer: Self-pay | Admitting: *Deleted

## 2012-11-19 LAB — CBC WITH DIFFERENTIAL/PLATELET
Basophils Relative: 0.1 % (ref 0.0–3.0)
Eosinophils Relative: 2.8 % (ref 0.0–5.0)
HCT: 23.4 % — CL (ref 36.0–46.0)
Hemoglobin: 7.7 g/dL — CL (ref 12.0–15.0)
Lymphocytes Relative: 23.7 % (ref 12.0–46.0)
Lymphs Abs: 1.6 10*3/uL (ref 0.7–4.0)
Monocytes Relative: 6 % (ref 3.0–12.0)
Neutro Abs: 4.7 10*3/uL (ref 1.4–7.7)
RBC: 2.61 Mil/uL — ABNORMAL LOW (ref 3.87–5.11)
WBC: 6.9 10*3/uL (ref 4.5–10.5)

## 2012-11-19 LAB — HEMOGLOBIN A1C: Hgb A1c MFr Bld: 6.9 % — ABNORMAL HIGH (ref 4.6–6.5)

## 2012-11-19 NOTE — Assessment & Plan Note (Signed)
Interval history: she has been feeling well and has not had any major acute medical problems. She did have Carotid dopplers 1 year ago. Physical exam, limited, OK. Lab results are in normal range except for Iron and Hgb. She is current with colorectal cancer screening with last study in '09. Immunizations are up to date.  In summary - a very nice woman who appears medically stable but her iron and Hgb are worrisomely low and will need to be addressed.

## 2012-11-19 NOTE — Assessment & Plan Note (Signed)
BP Readings from Last 3 Encounters:  11/15/12 122/70  05/13/12 140/78  03/04/12 128/56   Good control on present medications. Lab reveals normal renal function and electrolytes.

## 2012-11-19 NOTE — Assessment & Plan Note (Signed)
LDL is definitely better than goal of 100 or less; HDL is robust.  Plan Continue present medications

## 2012-11-19 NOTE — Assessment & Plan Note (Signed)
Stable with no signs of decompensation despite anemia.  Plan - continue present regimen  For transfusion will need hospital observation.

## 2012-11-19 NOTE — Assessment & Plan Note (Signed)
Persistent Iron deficiency anemia with Iron % saturation of 6%, Hgb 7.7.  Plan Observation admission for  transfusion 2u PRBCs and IV Iron

## 2012-11-19 NOTE — Telephone Encounter (Signed)
Rec call from [facility] lab. Pt's Hgb is 7.7. Per Dr. Posey Rea- I advised pt to keep taking her Ferrous Sulfate 1 bid and f/u with PCP in 1 month. Pt understands.

## 2012-11-19 NOTE — Assessment & Plan Note (Signed)
Stable with last study Jan '13.   Plan - due for annual carotid doppler.

## 2012-11-19 NOTE — Assessment & Plan Note (Signed)
No reports of hypoglycemia. She does try to follow a sugar free diet.  A1C 6.9% - at goal  Plan Continue present medications  Stick with no sugar diet.

## 2012-11-20 ENCOUNTER — Observation Stay (HOSPITAL_COMMUNITY)
Admission: AD | Admit: 2012-11-20 | Discharge: 2012-11-21 | Disposition: A | Discharge status: Home / Self Care | Payer: Medicare Other | Source: Ambulatory Visit | Attending: Internal Medicine | Admitting: Internal Medicine

## 2012-11-20 ENCOUNTER — Encounter (HOSPITAL_COMMUNITY): Payer: Self-pay | Admitting: *Deleted

## 2012-11-20 DIAGNOSIS — D509 Iron deficiency anemia, unspecified: Principal | ICD-10-CM | POA: Insufficient documentation

## 2012-11-20 DIAGNOSIS — E785 Hyperlipidemia, unspecified: Secondary | ICD-10-CM | POA: Insufficient documentation

## 2012-11-20 DIAGNOSIS — Z8673 Personal history of transient ischemic attack (TIA), and cerebral infarction without residual deficits: Secondary | ICD-10-CM | POA: Insufficient documentation

## 2012-11-20 DIAGNOSIS — I5022 Chronic systolic (congestive) heart failure: Secondary | ICD-10-CM

## 2012-11-20 DIAGNOSIS — I428 Other cardiomyopathies: Secondary | ICD-10-CM | POA: Insufficient documentation

## 2012-11-20 DIAGNOSIS — I10 Essential (primary) hypertension: Secondary | ICD-10-CM | POA: Insufficient documentation

## 2012-11-20 DIAGNOSIS — Z7982 Long term (current) use of aspirin: Secondary | ICD-10-CM | POA: Insufficient documentation

## 2012-11-20 DIAGNOSIS — E119 Type 2 diabetes mellitus without complications: Secondary | ICD-10-CM | POA: Insufficient documentation

## 2012-11-20 DIAGNOSIS — I509 Heart failure, unspecified: Secondary | ICD-10-CM | POA: Insufficient documentation

## 2012-11-20 DIAGNOSIS — Z79899 Other long term (current) drug therapy: Secondary | ICD-10-CM | POA: Insufficient documentation

## 2012-11-20 LAB — ABO/RH: ABO/RH(D): A POS

## 2012-11-20 LAB — CBC
HCT: 24.6 % — ABNORMAL LOW (ref 36.0–46.0)
Hemoglobin: 7.6 g/dL — ABNORMAL LOW (ref 12.0–15.0)
MCH: 29 pg (ref 26.0–34.0)
MCHC: 30.9 g/dL (ref 30.0–36.0)
MCV: 93.9 fL (ref 78.0–100.0)

## 2012-11-20 LAB — CREATININE, SERUM: GFR calc non Af Amer: 57 mL/min — ABNORMAL LOW (ref >90)

## 2012-11-20 MED ORDER — ASPIRIN EC 81 MG PO TBEC
81.0000 mg | DELAYED_RELEASE_TABLET | Freq: Every day | ORAL | Status: DC
Start: 2012-11-21 — End: 2012-11-21
  Administered 2012-11-21: 81 mg via ORAL
  Filled 2012-11-20: qty 1

## 2012-11-20 MED ORDER — FUROSEMIDE 40 MG PO TABS
40.0000 mg | ORAL_TABLET | Freq: Every day | ORAL | Status: DC
  Administered 2012-11-21: 40 mg via ORAL
  Filled 2012-11-20: qty 1

## 2012-11-20 MED ORDER — ACETAMINOPHEN 325 MG PO TABS: 650.0000 mg | ORAL_TABLET | Freq: Four times a day (QID) | ORAL | Status: DC | PRN

## 2012-11-20 MED ORDER — SODIUM CHLORIDE 0.45 % IV SOLN
50.0000 mL/h | INTRAVENOUS | Status: DC
  Administered 2012-11-20: 50 mL/h via INTRAVENOUS

## 2012-11-20 MED ORDER — SODIUM CHLORIDE 0.9 % IV SOLN
25.0000 mg | Freq: Once | INTRAVENOUS | Status: AC
  Administered 2012-11-20: 25 mg via INTRAVENOUS
  Filled 2012-11-20: qty 0.5

## 2012-11-20 MED ORDER — SODIUM CHLORIDE 0.9 % IV SOLN: INTRAVENOUS | Status: DC

## 2012-11-20 MED ORDER — LOSARTAN POTASSIUM 50 MG PO TABS
100.0000 mg | ORAL_TABLET | Freq: Every day | ORAL | Status: DC
  Administered 2012-11-21: 100 mg via ORAL
  Filled 2012-11-20: qty 2

## 2012-11-20 MED ORDER — ENOXAPARIN SODIUM 40 MG/0.4ML ~~LOC~~ SOLN
40.0000 mg | SUBCUTANEOUS | Status: DC
  Administered 2012-11-20: 40 mg via SUBCUTANEOUS
  Filled 2012-11-20 (×2): qty 0.4

## 2012-11-20 MED ORDER — CARVEDILOL 12.5 MG PO TABS: 12.5000 mg | ORAL_TABLET | Freq: Two times a day (BID) | ORAL | Status: DC

## 2012-11-20 MED ORDER — CARVEDILOL 12.5 MG PO TABS
12.5000 mg | ORAL_TABLET | Freq: Two times a day (BID) | ORAL | Status: DC
  Administered 2012-11-20 – 2012-11-21 (×2): 12.5 mg via ORAL
  Filled 2012-11-20 (×4): qty 1

## 2012-11-20 MED ORDER — ACETAMINOPHEN 650 MG RE SUPP: 650.0000 mg | Freq: Four times a day (QID) | RECTAL | Status: DC | PRN

## 2012-11-20 MED ORDER — FUROSEMIDE 10 MG/ML IJ SOLN
20.0000 mg | Freq: Once | INTRAMUSCULAR | Status: DC
  Filled 2012-11-20: qty 2

## 2012-11-20 MED ORDER — LOSARTAN POTASSIUM 50 MG PO TABS: 100.0000 mg | ORAL_TABLET | Freq: Every day | ORAL | Status: DC

## 2012-11-20 MED ORDER — SODIUM CHLORIDE 0.9 % IV SOLN
500.0000 mg | Freq: Every day | INTRAVENOUS | Status: AC
  Administered 2012-11-21 (×2): 500 mg via INTRAVENOUS
  Filled 2012-11-20 (×2): qty 10

## 2012-11-20 MED ORDER — METFORMIN HCL 500 MG PO TABS
1000.0000 mg | ORAL_TABLET | Freq: Two times a day (BID) | ORAL | Status: DC
  Administered 2012-11-20 – 2012-11-21 (×2): 1000 mg via ORAL
  Filled 2012-11-20 (×4): qty 2

## 2012-11-20 MED ORDER — ATORVASTATIN CALCIUM 40 MG PO TABS
40.0000 mg | ORAL_TABLET | Freq: Every day | ORAL | Status: DC
  Administered 2012-11-20: 40 mg via ORAL
  Filled 2012-11-20 (×2): qty 1

## 2012-11-20 MED ORDER — METFORMIN HCL 500 MG PO TABS: 1000.0000 mg | ORAL_TABLET | Freq: Two times a day (BID) | ORAL | Status: DC

## 2012-11-20 MED ORDER — PIOGLITAZONE HCL 45 MG PO TABS
45.0000 mg | ORAL_TABLET | Freq: Every day | ORAL | Status: DC
  Administered 2012-11-21: 45 mg via ORAL
  Filled 2012-11-20: qty 1

## 2012-11-20 NOTE — H&P (Signed)
Joanna [name] is an 79 y.o. female.   Chief Complaint: Increased dyspnea, weakness in setting of anemia HPI: Joanna [name], a very active and alert 79 y/o, has a h/o iron deficiency anemia with last transfusion in Nov '12 when Hgb was 6.8. Her last colonoscopy was in '09 and in '12 GI consultant did NOT recommend repeat study. She has had no outward signs of bleeding. Over the past several weeks she does endorse increased DOE, tachycardia with effort, decreased stamina but denies chest pain, abdominal pain or other focal symptoms. She does have a known cardiomyopathy with last EF 35% with LVH July '13. Due to being symptomatic with her anemia, Hgb 7.7 g, in a setting of cardiomyopathy with a need for great care in fluid management she is now brought in for transfusion and IV iron. A full explanation of the need for transfusion and the risks/benefits of transfusion was presented to her and her son and she agrees to transfusion.  Past Medical History  Diagnosis Date  . HTN (hypertension)   . Diabetes mellitus   . Peripheral artery disease   . Hyperlipidemia   . Anemia   . PONV (postoperative nausea and vomiting)   . Shortness of breath   . CHF (congestive heart failure)   . Arthritis   . Aortic stenosis 09/21/2011  . AORTIC VALVE SCLEROSIS 02/21/2010  . 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 BUNDLE BRANCH BLOCK, LEFT 07/30/2007  . CAROTID ARTERY STENOSIS 01/05/2009  . CVA 03/14/2010  . DM 07/30/2007  . Edema 04/07/2008  . Herpes zoster without mention of complication 09/29/2007  . 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 HYPERLIPIDEMIA 07/30/2007  . HYPERTENSION 07/30/2007  . Iron deficiency anemia 09/21/2011  . PERIPHERAL VASCULAR DISEASE 07/30/2007  . POLYPECTOMY, HX OF 07/30/2007  . PULMONARY HYPERTENSION 03/23/2009  . SKIN RASH, ALLERGIC 04/07/2008    Past Surgical History  Procedure Date  . Carotid endarterectomy   . Polypectomy 12/27/04  . Cataract extraction 2009    Family History  Problem  Relation Age of Onset  . Colon cancer Mother   . Diabetes Neg Hx   . Coronary artery disease Neg Hx    Social History:  reports that she has never smoked. She has never used smokeless tobacco. She reports that she does not drink alcohol or use illicit drugs. Patient lives alone here in town. 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 She is divorced after 23 years of marriage1 son- '61, minister; 1 dtr '55; 4 grandchildren. End of life care-provided packet on living wills Jan '14  Allergies:  Allergies  Allergen Reactions  . Other Hives    STEROIDS  . Prednisone Hives and Other (See Comments)    She is allergic to all steroids!    Medications Prior to Admission  Medication Sig Dispense Refill  . aspirin EC 81 MG tablet Take 81 mg by mouth daily.       . calcium citrate-vitamin D (CITRACAL+D) 315-200 MG-UNIT per tablet Take 2 tablets by mouth 2 (two) times daily.       . carvedilol (COREG) 6.25 MG tablet Take 12.5 mg by mouth 2 (two) times daily. Take 2 tablets 2 x daily. In the morning and afternoon      . cholecalciferol (VITAMIN D) 1000 UNITS tablet Take 2,000 Units by mouth daily.        Marland Kitchen docusate sodium (COLACE) 100 MG capsule Take 100 mg by mouth 2 (two) times daily.        Marland Kitchen  ferrous sulfate 325 (65 FE) MG tablet Take 325 mg by mouth 2 (two) times daily with a meal.      . fish oil-omega-3 fatty acids 1000 MG capsule Take 1 g by mouth 2 (two) times daily.       . furosemide (LASIX) 40 MG tablet Take 1 tab every Sun, Tue, Thur and Sat and 1/2 tab every Mon, Wed and Fri.  Can take 1 tab on Mon, Wed and Fri if your wt is up 3 lbs  90 tablet  3  . Garlic 1000 MG CAPS Take 1 capsule by mouth daily.        Marland Kitchen losartan (COZAAR) 100 MG tablet Take 1 tablet (100 mg total) by mouth daily.  90 tablet  1  . metFORMIN (GLUCOPHAGE) 1000 MG tablet Take 1 tablet (1,000 mg total) by mouth 2 (two) times daily with a meal.  180 tablet  1  . pioglitazone (ACTOS) 45 MG tablet Take 1  tablet (45 mg total) by mouth daily.  90 tablet  1  . simvastatin (ZOCOR) 80 MG tablet Take 1 tablet (80 mg total) by mouth daily.  90 tablet  1  . [DISCONTINUED] carvedilol (COREG) 6.25 MG tablet Take 2 tablets 2 x daily. In the morning and afternoon  120 tablet  3    Review of Systems  Constitutional: Negative for fever, chills and weight loss.  HENT: Negative for hearing loss and congestion.   Eyes: Negative.   Respiratory: Positive for shortness of breath. Negative for cough and hemoptysis.   Cardiovascular: Negative for chest pain, palpitations, orthopnea, claudication and leg swelling.  Gastrointestinal: Negative.   Genitourinary: Negative.   Musculoskeletal: Negative.   Skin: Negative.   Neurological: Positive for weakness. Negative for headaches.  Endo/Heme/Allergies: Negative.   Psychiatric/Behavioral: Negative.    Lab Results  Component Value Date   WBC 6.9 11/15/2012   HGB 7.7 Repeated and verified X2.* 11/15/2012   HCT 23.4 Repeated and verified X2.* 11/15/2012   PLT 224.0 11/15/2012   GLUCOSE 147* 11/15/2012   CHOL 207* 11/15/2012   TRIG 138.0 11/15/2012   HDL 66.00 11/15/2012   LDLDIRECT 110.1 11/15/2012   LDLCALC 75 11/15/2010        ALT 11 11/15/2012   AST 18 11/15/2012        NA 139 11/15/2012   K 4.2 11/15/2012   CL 97 11/15/2012   CREATININE 1.2 11/15/2012   BUN 37* 11/15/2012   CO2 33* 11/15/2012   TSH 1.47 10/26/2009   INR 1.12 09/17/2011   HGBA1C 6.9* 11/15/2012       Iron                         29                                                                    11/15/2012      Iron % Sat              6%  11/15/2012   Blood pressure 140/58, pulse 78, temperature 98 F (36.7 C), temperature source Oral, resp. rate 14, height 5\' 4"  (1.626 m), weight 141 lb 3.2 oz (64.048 kg), SpO2 98.00%. Physical Exam  Gen'l - WNWD elderly white woman who looks great for her age HEENT- [place]/AT, C&S clear Neck -  supple Cor 2+ radial pulse, RRR with PVC's, III/VI holosystolic murmur best at LSB, II/VI murmur at RSB, no JVD, No carotid bruits (sounds c/w radiation of cardiac murmur). Pulm - normal respirations, no increased WOB at rest. Abd - BS+, soft, no guarding or rebound Pelvic/rectal exams deferred Neuro - A&O x 3, CN II-XII normal Assessment/Plan 1. Iron deficiency anemia - Joanna [name] is symptomatic due to her anemia with increase cardiac work in a setting of pre-existing cardiomyopathy. Plan T&C & transfuse 2 units of pRBCs  Iron infusion  Will continue her home medications.  Illene Regulus 11/20/2012, 11:33 AM

## 2012-11-20 NOTE — Progress Notes (Addendum)
MEDICATION RELATED CONSULT NOTE - INITIAL   Pharmacy Consult for IV iron Indication: iron deficiency anemia  Allergies  Allergen Reactions  . Other Hives    STEROIDS  . Prednisone Hives and Other (See Comments)    She is allergic to all steroids!    Patient Measurements: Height: 5\' 4"  (162.6 cm) Weight: 141 lb 3.2 oz (64.048 kg) IBW/kg (Calculated) : 54.7  Adjusted Body Weight:   Vital Signs: Temp: 98 F (36.7 C) (02/01 1005) Temp src: Oral (02/01 1005) BP: 140/58 mmHg (02/01 1005) Pulse Rate: 78  (02/01 1005) Intake/Output from previous day:   Intake/Output from this shift:    Labs: No results found for this basename: WBC:3,HGB:3,HCT:3,PLT:3,APTT:3;INR:3,CREATININE:3,LABCREA:3,CREATININE:3,CREAT24HRUR:3,MG:3,PHOS:3,ALBUMIN:3,PROT:3,ALBUMIN:3,AST:3,ALT:3,ALKPHOS:3,BILITOT:3,BILIDIR:3,IBILI:3 in the last 72 hours Estimated Creatinine Clearance: 32.3 ml/min (by C-G formula based on Cr of 1.2).   Microbiology: No results found for this or any previous visit (from the past 720 hour(s)).  Medical History: Past Medical History  Diagnosis Date  . HTN (hypertension)   . Diabetes mellitus   . Peripheral artery disease   . Hyperlipidemia   . Anemia   . PONV (postoperative nausea and vomiting)   . Shortness of breath   . CHF (congestive heart failure)   . Arthritis   . Aortic stenosis 09/21/2011  . AORTIC VALVE SCLEROSIS 02/21/2010  . 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 BUNDLE BRANCH BLOCK, LEFT 07/30/2007  . CAROTID ARTERY STENOSIS 01/05/2009  . CVA 03/14/2010  . DM 07/30/2007  . Edema 04/07/2008  . Herpes zoster without mention of complication 09/29/2007  . 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 HYPERLIPIDEMIA 07/30/2007  . HYPERTENSION 07/30/2007  . Iron deficiency anemia 09/21/2011  . PERIPHERAL VASCULAR DISEASE 07/30/2007  . POLYPECTOMY, HX OF 07/30/2007  . PULMONARY HYPERTENSION 03/23/2009  . SKIN RASH, ALLERGIC 04/07/2008    Medications:  Scheduled:    . aspirin EC   81 mg Oral Daily  . atorvastatin  40 mg Oral q1800  . carvedilol  12.5 mg Oral BID WC  . enoxaparin (LOVENOX) injection  40 mg Subcutaneous Q24H  . furosemide  20 mg Intravenous Once  . furosemide  40 mg Oral Daily  . losartan  100 mg Oral Daily  . metFORMIN  1,000 mg Oral BID WC  . pioglitazone  45 mg Oral Daily  . [DISCONTINUED] carvedilol  12.5 mg Oral BID  . [DISCONTINUED] losartan  100 mg Oral Daily  . [DISCONTINUED] metFORMIN  1,000 mg Oral BID WC    Assessment: 82 YOF presents with increased dyspnea and weakness. Hgb = 7.7 at admission, she has known h/o iron deficiency.  Iron low at  29, pharmacy asked to dose IV iron. She will also be getting transfused.   For a target Hgb of 12, calculated iron deficiency is 1250mg   Goal of Therapy:  Correction of iron/Hgb  Plan:   Iron dextran 25mg  test dose, followed 500mg  IV x 2 doses. Give 1st dose tonight following transfusion  Recommend re-starting oral iron therapy  Dannielle Huh 11/20/2012,12:14 PM

## 2012-11-21 LAB — TYPE AND SCREEN
Antibody Screen: NEGATIVE
Unit division: 0

## 2012-11-21 LAB — GLUCOSE, CAPILLARY

## 2012-11-21 NOTE — Telephone Encounter (Signed)
Admitted to hospital 11/20/12 for transfusion 2 u PRBCs and iron IV

## 2012-11-21 NOTE — Progress Notes (Signed)
Subjective: Completed blood transfusion w/o incident. Finishing second 500mg  IV iron. Feels good  Objective: Lab: Lab Results  Component Value Date   WBC 5.6 11/20/2012   HGB 9.1* 11/20/2012   HCT 27.6* 11/20/2012   MCV 93.9 11/20/2012   PLT 220 11/20/2012   BMET    Component Value Date/Time   NA 139 11/15/2012 1437   K 4.2 11/15/2012 1437   CL 97 11/15/2012 1437   CO2 33* 11/15/2012 1437   GLUCOSE 147* 11/15/2012 1437   BUN 37* 11/15/2012 1437   CREATININE 0.92 11/20/2012 1237   CALCIUM 10.1 11/15/2012 1437   GFRNONAA 57* 11/20/2012 1237   GFRAA 66* 11/20/2012 1237     Imaging:  Scheduled Meds:   . aspirin EC  81 mg Oral Daily  . atorvastatin  40 mg Oral q1800  . carvedilol  12.5 mg Oral BID WC  . enoxaparin (LOVENOX) injection  40 mg Subcutaneous Q24H  . furosemide  20 mg Intravenous Once  . furosemide  40 mg Oral Daily  . iron dextran (INFED/DEXFERRUM) infusion  500 mg Intravenous Daily  . losartan  100 mg Oral Daily  . metFORMIN  1,000 mg Oral BID WC  . pioglitazone  45 mg Oral Daily   Continuous Infusions:   . sodium chloride 50 mL/hr (11/20/12 1400)  . sodium chloride     PRN Meds:.acetaminophen, acetaminophen   Physical Exam: Filed Vitals:   11/21/12 0606  BP: 144/42  Pulse: 55  Temp: 98.3 F (36.8 C)  Resp: 18        Assessment/Plan: Tolerated transfusion well and is tolerating IV iron infusion. For d/c home after iron infusion complete Dictated # [id number]   Illene Regulus [facility] (o) [phone]; (c) [phone] Call-grp - [facility]  Tele: [phone]  11/21/2012, 12:02 PM

## 2012-11-21 NOTE — Progress Notes (Signed)
Patient discharged to home with family, discharge instructions reviewed with patient and daughter who verbalized understanding. 

## 2012-11-22 ENCOUNTER — Telehealth: Payer: Self-pay | Admitting: General Practice

## 2012-11-22 NOTE — Discharge Summary (Signed)
NAMEStepanie, Joanna [name]                ACCOUNT NO.:  [id number]  MEDICAL RECORD NO.:  [id number]  LOCATION:  1301                         FACILITY:  [facility]  PHYSICIAN:  Joanna Gess. Cherrie Franca, MD  DATE OF BIRTH:  [birth year]  DATE OF ADMISSION:  11/20/2012 DATE OF DISCHARGE:  11/21/2012                              DISCHARGE SUMMARY   ADMITTING DIAGNOSIS:  Iron deficiency anemia.  DISCHARGE DIAGNOSIS:  Iron deficiency anemia.  CONSULTANTS:  None.  PROCEDURES: 1. Transfusion of 2 units of packed cells. 2. Transfusion/infusion of elemental iron.  HISTORY OF PRESENT ILLNESS:  Joanna [name] is a delightful 79 year old woman who has a longstanding history of iron deficiency anemia.  She has had previous GI evaluation.  She had a normal colonoscopy in 2009.  The patient's hemoglobins averages around 10 range.  At routine physical exam, she was noted to have a hemoglobin of 7.7 g.  On questioning, the patient did endorse having shortness of breath, decreased exercise tolerance, and decreased energy.  Because she had symptomatic anemia in the setting of a known cardiomyopathy with an ejection fraction of 35%, with anemia causing undue increased workload on her cardiovascular system, she was admitted for IV transfusion.  Please see the H and P for past medical history, family history, social history, and admission exam.  HOSPITAL COURSE:  The patient was typed and crossmatched and transfused 2 units of packed red cells.  Hemoglobin following transfusion was 9.1 g, up from 7.6 g.  The patient had iron deficiency with an iron saturation of 6%.  She was given 1000 mg of iron IV.  She tolerated this well.  With the patient having completed transfusion of 2 units of packed red cells and the infusion of 1000 mg of iron, she at this point, will be ready for discharge once the iron infusion is complete.  The patient reports she does feel stronger and better and has less shortness of  breath.  DISCHARGE EXAMINATION:  VITAL SIGNS:  Temperature was 98.3, blood pressure 144/42, pulse was 55, respirations 18, oxygen saturations 95% on room air. GENERAL APPEARANCE:  This is a well nourished, 79 year old, who looks to be in no acute distress. HEENT:  Unremarkable. PULMONARY:  The patient has no increased work of breathing. LUNGS:  Clear. CARDIOVASCULAR:  2+ radial pulse.  Her precordium was quiet.  She had a regular rate and rhythm with premature ventricular contractions.  No further exam conducted.  FINAL LABORATORY:  Hemoglobin following transfusion was 9.1 g.  DISPOSITION:  The patient is to be discharged to home.  She will continue on her home medications including over-the-counter iron supplement.  The patient will be seen in the office in 14 days for a followup lab work, and reassessment.  The patient's condition at time of discharge dictation is stable and improved.     Joanna Gess Chidiebere Wynn, MD     MEN/MEDQ  D:  11/21/2012  T:  11/22/2012  Job:  [id number]

## 2012-11-22 NOTE — Telephone Encounter (Signed)
LMOM

## 2012-11-25 ENCOUNTER — Telehealth: Payer: Self-pay | Admitting: General Practice

## 2012-11-25 NOTE — Telephone Encounter (Signed)
LMOM for pt to call office  

## 2012-11-29 ENCOUNTER — Ambulatory Visit: Payer: Medicare Other | Admitting: Internal Medicine

## 2012-12-06 ENCOUNTER — Ambulatory Visit: Payer: Medicare Other | Admitting: Internal Medicine

## 2012-12-06 DIAGNOSIS — I6529 Occlusion and stenosis of unspecified carotid artery: Secondary | ICD-10-CM

## 2012-12-07 ENCOUNTER — Other Ambulatory Visit (INDEPENDENT_AMBULATORY_CARE_PROVIDER_SITE_OTHER): Payer: Medicare Other

## 2012-12-07 ENCOUNTER — Ambulatory Visit (INDEPENDENT_AMBULATORY_CARE_PROVIDER_SITE_OTHER): Payer: Medicare Other | Admitting: Internal Medicine

## 2012-12-07 ENCOUNTER — Encounter: Payer: Self-pay | Admitting: Internal Medicine

## 2012-12-07 VITALS — BP 120/58 | HR 74 | Temp 99.2°F | Resp 10 | Wt 141.0 lb

## 2012-12-07 DIAGNOSIS — D509 Iron deficiency anemia, unspecified: Secondary | ICD-10-CM

## 2012-12-07 LAB — HEMOGLOBIN AND HEMATOCRIT, BLOOD: HCT: 31.2 % — ABNORMAL LOW (ref 36.0–46.0)

## 2012-12-07 NOTE — Progress Notes (Signed)
  Subjective:    Patient ID: Joanna [name], female    DOB: [birth year], 79 y.o.   MRN: [id number]  HPI Joanna [name] was found at her annual exam to have a Hgb of 7.7g. She had been feeling weak and short of breath. She was admitted on observation Feb 1st for transfusion of 2 units PRBCs and and iron infusion. She has been feeling a lot better. She has been taking iron tablets and lots of dietary iron. She has not missed a day of work.  PMH, FamHx and SocHx reviewed for any changes and relevance. Current Outpatient Prescriptions on File Prior to Visit  Medication Sig Dispense Refill  . aspirin EC 81 MG tablet Take 81 mg by mouth daily.       . calcium citrate-vitamin D (CITRACAL+D) 315-200 MG-UNIT per tablet Take 2 tablets by mouth 2 (two) times daily.       . carvedilol (COREG) 6.25 MG tablet Take 12.5 mg by mouth 2 (two) times daily. Take 2 tablets 2 x daily. In the morning and afternoon      . cholecalciferol (VITAMIN D) 1000 UNITS tablet Take 2,000 Units by mouth daily.        Marland Kitchen docusate sodium (COLACE) 100 MG capsule Take 100 mg by mouth 2 (two) times daily.        . ferrous sulfate 325 (65 FE) MG tablet Take 325 mg by mouth 2 (two) times daily with a meal.      . fish oil-omega-3 fatty acids 1000 MG capsule Take 1 g by mouth 2 (two) times daily.       . furosemide (LASIX) 40 MG tablet Take 1 tab every Sun, Tue, Thur and Sat and 1/2 tab every Mon, Wed and Fri.  Can take 1 tab on Mon, Wed and Fri if your wt is up 3 lbs  90 tablet  3  . Garlic 1000 MG CAPS Take 1 capsule by mouth daily.        Marland Kitchen losartan (COZAAR) 100 MG tablet Take 1 tablet (100 mg total) by mouth daily.  90 tablet  1  . metFORMIN (GLUCOPHAGE) 1000 MG tablet Take 1 tablet (1,000 mg total) by mouth 2 (two) times daily with a meal.  180 tablet  1  . pioglitazone (ACTOS) 45 MG tablet Take 1 tablet (45 mg total) by mouth daily.  90 tablet  1  . simvastatin (ZOCOR) 80 MG tablet Take 1 tablet (80 mg total) by mouth daily.  90 tablet  1    No current facility-administered medications on file prior to visit.      Review of Systems System review is negative for any constitutional, cardiac, pulmonary, GI or neuro symptoms or complaints other than as described in the HPI.     Objective:   Physical Exam Filed Vitals:   12/07/12 1522  BP: 120/58  Pulse: 74  Temp: 99.2 F (37.3 C)  Resp: 10   HEENT- eyelids are red. C&S clear w/o pallor Cor RRR IV/VI systolic murmur Pulm - clear to AP       Assessment & Plan:

## 2012-12-07 NOTE — Assessment & Plan Note (Addendum)
Recent hospitalization for transfusion of 2 units blood for symptomatic anemia with Hgb 7.7g  She also had Iron infusion. Since d/c doing well  Plan Lab today: Hemoglobin - will call with results.   Continue iron tablets and all the good dietary iron.  Addendum:  Lab Results  Component Value Date   HGB 10.4* 12/07/2012

## 2012-12-07 NOTE — Patient Instructions (Addendum)
Recent hospitalization for transfusion of 2 units blood for symptomatic anemia with Hgb 7.7g  She also had Iron infusion. Since d/c doing well  Plan Lab today: Hemoglobin - will call with results.   Continue iron tablets and all the good dietary iron.

## 2012-12-22 DIAGNOSIS — D509 Iron deficiency anemia, unspecified: Secondary | ICD-10-CM

## 2013-01-27 ENCOUNTER — Ambulatory Visit (HOSPITAL_COMMUNITY)
Admission: RE | Admit: 2013-01-27 | Discharge: 2013-01-27 | Disposition: A | Discharge status: Home / Self Care | Payer: Medicare Other | Source: Ambulatory Visit | Attending: Internal Medicine | Admitting: Internal Medicine

## 2013-01-27 ENCOUNTER — Encounter (HOSPITAL_COMMUNITY): Payer: Self-pay

## 2013-01-27 VITALS — BP 140/68 | HR 77 | Wt 145.8 lb

## 2013-01-27 DIAGNOSIS — Z860100 Personal history of colon polyps, unspecified: Secondary | ICD-10-CM | POA: Insufficient documentation

## 2013-01-27 DIAGNOSIS — I447 Left bundle-branch block, unspecified: Secondary | ICD-10-CM | POA: Insufficient documentation

## 2013-01-27 DIAGNOSIS — B029 Zoster without complications: Secondary | ICD-10-CM | POA: Insufficient documentation

## 2013-01-27 DIAGNOSIS — E785 Hyperlipidemia, unspecified: Secondary | ICD-10-CM | POA: Insufficient documentation

## 2013-01-27 DIAGNOSIS — I10 Essential (primary) hypertension: Secondary | ICD-10-CM | POA: Insufficient documentation

## 2013-01-27 DIAGNOSIS — E783 Hyperchylomicronemia: Secondary | ICD-10-CM | POA: Insufficient documentation

## 2013-01-27 DIAGNOSIS — Z8673 Personal history of transient ischemic attack (TIA), and cerebral infarction without residual deficits: Secondary | ICD-10-CM | POA: Insufficient documentation

## 2013-01-27 DIAGNOSIS — I509 Heart failure, unspecified: Secondary | ICD-10-CM | POA: Insufficient documentation

## 2013-01-27 DIAGNOSIS — I5022 Chronic systolic (congestive) heart failure: Secondary | ICD-10-CM

## 2013-01-27 DIAGNOSIS — I350 Nonrheumatic aortic (valve) stenosis: Secondary | ICD-10-CM

## 2013-01-27 DIAGNOSIS — I739 Peripheral vascular disease, unspecified: Secondary | ICD-10-CM | POA: Insufficient documentation

## 2013-01-27 DIAGNOSIS — I359 Nonrheumatic aortic valve disorder, unspecified: Secondary | ICD-10-CM

## 2013-01-27 DIAGNOSIS — D509 Iron deficiency anemia, unspecified: Secondary | ICD-10-CM | POA: Insufficient documentation

## 2013-01-27 DIAGNOSIS — I6529 Occlusion and stenosis of unspecified carotid artery: Secondary | ICD-10-CM | POA: Insufficient documentation

## 2013-01-27 DIAGNOSIS — E119 Type 2 diabetes mellitus without complications: Secondary | ICD-10-CM | POA: Insufficient documentation

## 2013-01-27 MED ORDER — FUROSEMIDE 40 MG PO TABS: ORAL_TABLET | ORAL | Status: DC

## 2013-01-27 NOTE — Assessment & Plan Note (Signed)
Volume status looks good.  Will continue lasix 40 mg BID.  Will have her move her second dose to earlier in the day so that it is not keeping her up at night.  Can not titrate carvedilol further due to increased fatigue.  Will follow up with repeat echo to reassess LV and aortic vlave.

## 2013-01-27 NOTE — Assessment & Plan Note (Signed)
Moderate per last echo, will repeat echo at follow up.  Continue medical management.

## 2013-01-27 NOTE — Progress Notes (Signed)
HPI:  [name] is a 79 year old woman with aortic stenosis, hypertension, diabetes mellitus type II, hyperlipidemia, and peripheral artery disease.   Admitted 12/12 with acute systolic HF, EF 30%. Cath with normal coronaries and normal cardiac output (see below). AS was mild to moderate. In hospital also found to be iron-deficient. Previous colonoscopies normal. Seen by GI who suggested trial of iron and see if it improved. If not, repeat endo. Stool guaiac was negative.   RA = 8 RV = 58/2/11 PA = 54/22 (37) PCW = 20 Fick cardiac output/index = 4.57/2.67 PVR = 3.7 [place]   D/C from hospital 12/ 3/ 2012. D/C weight 145 (admit weight 160) Admit due to progressive SOB. ARB/Beta blocker initiated and titrated.   12/18/11 ECHO 30-35% mod AS mean gradient 27 03/26/12 ECHO 35% mod AS mean gradient 27  Unable to tolerate up titration of carvedilol due to fatigue.   She returns for follow up today.  She was admitted in Feb for Hg 7.7 requiring 2 units of pRBCs and elemental iron.  Discharge Hgb 9.1 and on supplemental iron.  She will start working part time in May, she has been there 60 yrs.  She is feeling great today.  She denies SOB/PND/Orthopnea.  Compliant with medications. 138 pounds at home.    ROS: All systems negative except as listed in HPI, PMH and Problem List.  Past Medical History  Diagnosis Date  . HTN (hypertension)   . Diabetes mellitus   . Peripheral artery disease   . Hyperlipidemia   . Anemia   . PONV (postoperative nausea and vomiting)   . Shortness of breath   . CHF (congestive heart failure)   . Arthritis   . Aortic stenosis 09/21/2011  . AORTIC VALVE SCLEROSIS 02/21/2010  . 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 BUNDLE BRANCH BLOCK, LEFT 07/30/2007  . CAROTID ARTERY STENOSIS 01/05/2009  . CVA 03/14/2010  . DM 07/30/2007  . Edema 04/07/2008  . Herpes zoster without mention of complication 09/29/2007  . 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 HYPERLIPIDEMIA 07/30/2007  . HYPERTENSION  07/30/2007  . Iron deficiency anemia 09/21/2011  . PERIPHERAL VASCULAR DISEASE 07/30/2007  . POLYPECTOMY, HX OF 07/30/2007  . PULMONARY HYPERTENSION 03/23/2009  . SKIN RASH, ALLERGIC 04/07/2008     Current Outpatient Prescriptions  Medication Sig Dispense Refill  . aspirin EC 81 MG tablet Take 81 mg by mouth daily.       . calcium citrate-vitamin D (CITRACAL+D) 315-200 MG-UNIT per tablet Take 2 tablets by mouth 2 (two) times daily.       . carvedilol (COREG) 6.25 MG tablet Take 12.5 mg by mouth 2 (two) times daily. Take 2 tablets 2 x daily. In the morning and afternoon      . cholecalciferol (VITAMIN D) 1000 UNITS tablet Take 2,000 Units by mouth daily.        Marland Kitchen docusate sodium (COLACE) 100 MG capsule Take 100 mg by mouth 2 (two) times daily.        . ferrous sulfate 325 (65 FE) MG tablet Take 325 mg by mouth 2 (two) times daily with a meal.      . fish oil-omega-3 fatty acids 1000 MG capsule Take 1 g by mouth 2 (two) times daily.       . furosemide (LASIX) 40 MG tablet Take 1 tab every Sun, Tue, Thur and Sat and 1/2 tab every Mon, Wed and Fri.  Can take 1 tab on Mon, Wed and Fri if your wt is  up 3 lbs  90 tablet  3  . Garlic 1000 MG CAPS Take 1 capsule by mouth daily.        Marland Kitchen losartan (COZAAR) 100 MG tablet Take 1 tablet (100 mg total) by mouth daily.  90 tablet  1  . metFORMIN (GLUCOPHAGE) 1000 MG tablet Take 1 tablet (1,000 mg total) by mouth 2 (two) times daily with a meal.  180 tablet  1  . pioglitazone (ACTOS) 45 MG tablet Take 1 tablet (45 mg total) by mouth daily.  90 tablet  1  . simvastatin (ZOCOR) 80 MG tablet Take 1 tablet (80 mg total) by mouth daily.  90 tablet  1   No current facility-administered medications for this encounter.     PHYSICAL EXAM: Filed Vitals:   01/27/13 1603  BP: 140/68  Pulse: 77  Weight: 145 lb 12.8 oz (66.134 kg)  SpO2: 97%    General:  Well appearing. No resp difficulty HEENT: normal Neck: supple. JVP 6-7 prominent CV waves Carotids 2+  bilaterally; no bruits. No lymphadenopathy or thryomegaly appreciated. Cor: PMI normal. Regular rate & rhythm. No rubs, gallops or 2/6 aortic murmur. Lungs: clear Abdomen: soft, nontender, nondistended. No hepatosplenomegaly. No bruits or masses. Good bowel sounds. Extremities: no cyanosis, clubbing, rash, trace-1+ edema Neuro: alert & orientedx3, cranial nerves grossly intact. Moves all 4 extremities w/o difficulty. Affect pleasant  ASSESSMENT & PLAN:

## 2013-02-04 ENCOUNTER — Other Ambulatory Visit: Payer: Self-pay | Admitting: Internal Medicine

## 2013-02-15 ENCOUNTER — Telehealth: Payer: Self-pay | Admitting: *Deleted

## 2013-02-15 MED ORDER — METFORMIN HCL 1000 MG PO TABS: ORAL_TABLET | ORAL | Status: DC

## 2013-02-15 NOTE — Telephone Encounter (Signed)
Message copied by Carin Primrose on Tue Feb 15, 2013  8:10 AM ------      Message from: Noralee Space      Created: Mon Feb 14, 2013  2:17 PM       Hey this lady was calling me about a refill on her Metformin, I saw that you sent it in on 4/18 along with some others, she said she got those but was told they didn't get the refill for metformin, can you please send her in a prescription to [facility], thanks ------

## 2013-03-03 ENCOUNTER — Other Ambulatory Visit: Payer: Self-pay | Admitting: Internal Medicine

## 2013-04-05 ENCOUNTER — Encounter: Payer: Self-pay | Admitting: Internal Medicine

## 2013-04-21 ENCOUNTER — Other Ambulatory Visit: Payer: Self-pay | Admitting: Internal Medicine

## 2013-04-28 ENCOUNTER — Other Ambulatory Visit (HOSPITAL_COMMUNITY): Payer: Self-pay | Admitting: Physician Assistant

## 2013-04-28 ENCOUNTER — Other Ambulatory Visit: Payer: Self-pay | Admitting: Internal Medicine

## 2013-05-09 ENCOUNTER — Inpatient Hospital Stay (HOSPITAL_COMMUNITY)
Admission: EM | Admit: 2013-05-09 | Discharge: 2013-05-11 | DRG: 306 | Disposition: A | Discharge status: Home / Self Care | Payer: Medicare Other | Attending: Cardiology | Admitting: Cardiology

## 2013-05-09 ENCOUNTER — Encounter (HOSPITAL_COMMUNITY): Payer: Self-pay

## 2013-05-09 ENCOUNTER — Ambulatory Visit (HOSPITAL_COMMUNITY)
Admission: RE | Admit: 2013-05-09 | Discharge: 2013-05-09 | Disposition: A | Discharge status: Home / Self Care | Payer: Medicare Other | Source: Ambulatory Visit | Attending: Internal Medicine | Admitting: Internal Medicine

## 2013-05-09 ENCOUNTER — Emergency Department (HOSPITAL_COMMUNITY): Payer: Medicare Other

## 2013-05-09 ENCOUNTER — Ambulatory Visit (HOSPITAL_BASED_OUTPATIENT_CLINIC_OR_DEPARTMENT_OTHER)
Admission: RE | Admit: 2013-05-09 | Discharge: 2013-05-09 | Disposition: A | Discharge status: Home / Self Care | Payer: Medicare Other | Source: Ambulatory Visit | Attending: Cardiology | Admitting: Cardiology

## 2013-05-09 VITALS — BP 130/82 | HR 98 | Wt 144.8 lb

## 2013-05-09 DIAGNOSIS — I359 Nonrheumatic aortic valve disorder, unspecified: Secondary | ICD-10-CM

## 2013-05-09 DIAGNOSIS — I5022 Chronic systolic (congestive) heart failure: Secondary | ICD-10-CM

## 2013-05-09 DIAGNOSIS — I6529 Occlusion and stenosis of unspecified carotid artery: Secondary | ICD-10-CM

## 2013-05-09 DIAGNOSIS — J9600 Acute respiratory failure, unspecified whether with hypoxia or hypercapnia: Secondary | ICD-10-CM | POA: Diagnosis present

## 2013-05-09 DIAGNOSIS — I447 Left bundle-branch block, unspecified: Secondary | ICD-10-CM | POA: Diagnosis present

## 2013-05-09 DIAGNOSIS — I059 Rheumatic mitral valve disease, unspecified: Secondary | ICD-10-CM

## 2013-05-09 DIAGNOSIS — I509 Heart failure, unspecified: Secondary | ICD-10-CM | POA: Diagnosis present

## 2013-05-09 DIAGNOSIS — I739 Peripheral vascular disease, unspecified: Secondary | ICD-10-CM | POA: Diagnosis present

## 2013-05-09 DIAGNOSIS — J810 Acute pulmonary edema: Secondary | ICD-10-CM

## 2013-05-09 DIAGNOSIS — I10 Essential (primary) hypertension: Secondary | ICD-10-CM | POA: Diagnosis present

## 2013-05-09 DIAGNOSIS — Z8673 Personal history of transient ischemic attack (TIA), and cerebral infarction without residual deficits: Secondary | ICD-10-CM

## 2013-05-09 DIAGNOSIS — E119 Type 2 diabetes mellitus without complications: Secondary | ICD-10-CM | POA: Diagnosis present

## 2013-05-09 DIAGNOSIS — Z602 Problems related to living alone: Secondary | ICD-10-CM

## 2013-05-09 DIAGNOSIS — I080 Rheumatic disorders of both mitral and aortic valves: Principal | ICD-10-CM | POA: Diagnosis present

## 2013-05-09 DIAGNOSIS — E785 Hyperlipidemia, unspecified: Secondary | ICD-10-CM

## 2013-05-09 DIAGNOSIS — I350 Nonrheumatic aortic (valve) stenosis: Secondary | ICD-10-CM

## 2013-05-09 DIAGNOSIS — I428 Other cardiomyopathies: Secondary | ICD-10-CM | POA: Diagnosis present

## 2013-05-09 DIAGNOSIS — I5023 Acute on chronic systolic (congestive) heart failure: Secondary | ICD-10-CM | POA: Diagnosis present

## 2013-05-09 DIAGNOSIS — I2789 Other specified pulmonary heart diseases: Secondary | ICD-10-CM | POA: Diagnosis present

## 2013-05-09 LAB — POCT I-STAT, CHEM 8
BUN: 35 mg/dL — ABNORMAL HIGH (ref 6–23)
Calcium, Ion: 1.32 mmol/L — ABNORMAL HIGH (ref 1.13–1.30)
Chloride: 101 mEq/L (ref 96–112)
Glucose, Bld: 334 mg/dL — ABNORMAL HIGH (ref 70–99)

## 2013-05-09 LAB — URINALYSIS, ROUTINE W REFLEX MICROSCOPIC
Ketones, ur: NEGATIVE mg/dL
Nitrite: NEGATIVE
Specific Gravity, Urine: 1.017 (ref 1.005–1.030)
pH: 7 (ref 5.0–8.0)

## 2013-05-09 LAB — MRSA PCR SCREENING: MRSA by PCR: NEGATIVE

## 2013-05-09 LAB — CBC WITH DIFFERENTIAL/PLATELET
Basophils Relative: 0 % (ref 0–1)
Eosinophils Relative: 1 % (ref 0–5)
HCT: 40.9 % (ref 36.0–46.0)
Hemoglobin: 13.2 g/dL (ref 12.0–15.0)
Lymphs Abs: 9.4 10*3/uL — ABNORMAL HIGH (ref 0.7–4.0)
MCH: 31.3 pg (ref 26.0–34.0)
MCV: 96.9 fL (ref 78.0–100.0)
Monocytes Absolute: 0.9 10*3/uL (ref 0.1–1.0)
RBC: 4.22 MIL/uL (ref 3.87–5.11)

## 2013-05-09 LAB — TROPONIN I: Troponin I: <0.3 ng/mL (ref <0.30)

## 2013-05-09 LAB — BASIC METABOLIC PANEL
BUN: 32 mg/dL — ABNORMAL HIGH (ref 6–23)
Chloride: 98 mEq/L (ref 96–112)
Glucose, Bld: 336 mg/dL — ABNORMAL HIGH (ref 70–99)
Potassium: 5.3 mEq/L — ABNORMAL HIGH (ref 3.5–5.1)

## 2013-05-09 LAB — POCT I-STAT 3, ART BLOOD GAS (G3+)
Acid-base deficit: 1 mmol/L (ref 0.0–2.0)
pO2, Arterial: 128 mmHg — ABNORMAL HIGH (ref 80.0–100.0)

## 2013-05-09 LAB — CK TOTAL AND CKMB (NOT AT ARMC): Relative Index: 2 (ref 0.0–2.5)

## 2013-05-09 LAB — URINE MICROSCOPIC-ADD ON

## 2013-05-09 MED ORDER — SODIUM CHLORIDE 0.9 % IV SOLN
INTRAVENOUS | Status: AC
  Administered 2013-05-09: 100 mL
  Filled 2013-05-09: qty 100

## 2013-05-09 MED ORDER — INSULIN ASPART 100 UNIT/ML ~~LOC~~ SOLN
0.0000 [IU] | Freq: Three times a day (TID) | SUBCUTANEOUS | Status: DC
  Administered 2013-05-10 – 2013-05-11 (×5): 2 [IU] via SUBCUTANEOUS

## 2013-05-09 MED ORDER — ETOMIDATE 2 MG/ML IV SOLN
INTRAVENOUS | Status: AC
  Filled 2013-05-09: qty 20

## 2013-05-09 MED ORDER — ASPIRIN EC 81 MG PO TBEC
81.0000 mg | DELAYED_RELEASE_TABLET | Freq: Every day | ORAL | Status: DC
  Administered 2013-05-10 – 2013-05-11 (×2): 81 mg via ORAL
  Filled 2013-05-09 (×2): qty 1

## 2013-05-09 MED ORDER — ROCURONIUM BROMIDE 50 MG/5ML IV SOLN
INTRAVENOUS | Status: AC
  Filled 2013-05-09: qty 2

## 2013-05-09 MED ORDER — INSULIN ASPART 100 UNIT/ML ~~LOC~~ SOLN
5.0000 [IU] | Freq: Once | SUBCUTANEOUS | Status: AC
  Administered 2013-05-09: 5 [IU] via INTRAVENOUS
  Filled 2013-05-09: qty 1

## 2013-05-09 MED ORDER — SODIUM CHLORIDE 0.9 % IJ SOLN: 3.0000 mL | INTRAMUSCULAR | Status: DC | PRN

## 2013-05-09 MED ORDER — NITROGLYCERIN IN D5W 200-5 MCG/ML-% IV SOLN
INTRAVENOUS | Status: AC
  Administered 2013-05-09: 50000 ug
  Filled 2013-05-09: qty 250

## 2013-05-09 MED ORDER — LOSARTAN POTASSIUM 50 MG PO TABS
100.0000 mg | ORAL_TABLET | Freq: Every day | ORAL | Status: DC
  Administered 2013-05-10 – 2013-05-11 (×2): 100 mg via ORAL
  Filled 2013-05-09 (×2): qty 2

## 2013-05-09 MED ORDER — ATORVASTATIN CALCIUM 40 MG PO TABS
40.0000 mg | ORAL_TABLET | Freq: Every day | ORAL | Status: DC
  Administered 2013-05-09 – 2013-05-10 (×2): 40 mg via ORAL
  Filled 2013-05-09 (×3): qty 1

## 2013-05-09 MED ORDER — CALCIUM CITRATE-VITAMIN D 315-200 MG-UNIT PO TABS: 2.0000 | ORAL_TABLET | Freq: Two times a day (BID) | ORAL | Status: DC

## 2013-05-09 MED ORDER — TRIAMCINOLONE ACETONIDE 0.1 % EX CREA
1.0000 "application " | TOPICAL_CREAM | Freq: Two times a day (BID) | CUTANEOUS | Status: DC | PRN
  Filled 2013-05-09: qty 15

## 2013-05-09 MED ORDER — HEPARIN SODIUM (PORCINE) 5000 UNIT/ML IJ SOLN
5000.0000 [IU] | Freq: Three times a day (TID) | INTRAMUSCULAR | Status: DC
  Filled 2013-05-09: qty 1

## 2013-05-09 MED ORDER — FUROSEMIDE 10 MG/ML IJ SOLN
40.0000 mg | Freq: Two times a day (BID) | INTRAMUSCULAR | Status: DC
  Administered 2013-05-09: 40 mg via INTRAVENOUS
  Filled 2013-05-09 (×3): qty 4

## 2013-05-09 MED ORDER — ONDANSETRON HCL 4 MG/2ML IJ SOLN: 4.0000 mg | Freq: Four times a day (QID) | INTRAMUSCULAR | Status: DC | PRN

## 2013-05-09 MED ORDER — FUROSEMIDE 10 MG/ML IJ SOLN: 20.0000 mg | Freq: Once | INTRAMUSCULAR | Status: AC

## 2013-05-09 MED ORDER — OMEGA-3 FATTY ACIDS 1000 MG PO CAPS: 1.0000 g | ORAL_CAPSULE | Freq: Two times a day (BID) | ORAL | Status: DC

## 2013-05-09 MED ORDER — LIDOCAINE HCL (CARDIAC) 20 MG/ML IV SOLN
INTRAVENOUS | Status: AC
  Filled 2013-05-09: qty 5

## 2013-05-09 MED ORDER — CALCIUM CARBONATE-VITAMIN D 500-200 MG-UNIT PO TABS
1.0000 | ORAL_TABLET | Freq: Two times a day (BID) | ORAL | Status: DC
  Administered 2013-05-10 – 2013-05-11 (×3): 1 via ORAL
  Filled 2013-05-09 (×4): qty 1

## 2013-05-09 MED ORDER — LOSARTAN POTASSIUM 50 MG PO TABS: 100.0000 mg | ORAL_TABLET | Freq: Every day | ORAL | Status: DC

## 2013-05-09 MED ORDER — SUCCINYLCHOLINE CHLORIDE 20 MG/ML IJ SOLN
INTRAMUSCULAR | Status: AC
  Filled 2013-05-09: qty 1

## 2013-05-09 MED ORDER — CALCIUM GLUCONATE 10 % IV SOLN
INTRAVENOUS | Status: AC
  Administered 2013-05-09: 1000 mg
  Filled 2013-05-09: qty 10

## 2013-05-09 MED ORDER — SODIUM CHLORIDE 0.9 % IV SOLN: 250.0000 mL | INTRAVENOUS | Status: DC | PRN

## 2013-05-09 MED ORDER — FERROUS SULFATE 325 (65 FE) MG PO TABS
325.0000 mg | ORAL_TABLET | Freq: Two times a day (BID) | ORAL | Status: DC
  Administered 2013-05-10 – 2013-05-11 (×3): 325 mg via ORAL
  Filled 2013-05-09 (×5): qty 1

## 2013-05-09 MED ORDER — OMEGA-3-ACID ETHYL ESTERS 1 G PO CAPS
1.0000 g | ORAL_CAPSULE | Freq: Two times a day (BID) | ORAL | Status: DC
  Administered 2013-05-10 – 2013-05-11 (×3): 1 g via ORAL
  Filled 2013-05-09 (×4): qty 1

## 2013-05-09 MED ORDER — CARVEDILOL 12.5 MG PO TABS
12.5000 mg | ORAL_TABLET | Freq: Two times a day (BID) | ORAL | Status: AC
  Administered 2013-05-10 (×2): 12.5 mg via ORAL
  Filled 2013-05-09 (×3): qty 1

## 2013-05-09 MED ORDER — FUROSEMIDE 10 MG/ML IJ SOLN
INTRAMUSCULAR | Status: AC
  Administered 2013-05-09: 20 mg via INTRAVENOUS
  Filled 2013-05-09: qty 4

## 2013-05-09 MED ORDER — VITAMIN D3 25 MCG (1000 UNIT) PO TABS
2000.0000 [IU] | ORAL_TABLET | Freq: Every day | ORAL | Status: DC
  Administered 2013-05-10 – 2013-05-11 (×2): 2000 [IU] via ORAL
  Filled 2013-05-09 (×2): qty 2

## 2013-05-09 MED ORDER — SODIUM CHLORIDE 0.9 % IJ SOLN
3.0000 mL | Freq: Two times a day (BID) | INTRAMUSCULAR | Status: DC
  Administered 2013-05-10 – 2013-05-11 (×3): 3 mL via INTRAVENOUS

## 2013-05-09 MED ORDER — DOCUSATE SODIUM 100 MG PO CAPS
100.0000 mg | ORAL_CAPSULE | Freq: Two times a day (BID) | ORAL | Status: DC
  Administered 2013-05-09 – 2013-05-11 (×4): 100 mg via ORAL
  Filled 2013-05-09 (×5): qty 1

## 2013-05-09 MED ORDER — INSULIN ASPART 100 UNIT/ML ~~LOC~~ SOLN
0.0000 [IU] | Freq: Every day | SUBCUTANEOUS | Status: DC
  Administered 2013-05-09: 2 [IU] via SUBCUTANEOUS
  Administered 2013-05-10: 4 [IU] via SUBCUTANEOUS

## 2013-05-09 MED ORDER — ACETAMINOPHEN 325 MG PO TABS: 650.0000 mg | ORAL_TABLET | ORAL | Status: DC | PRN

## 2013-05-09 NOTE — ED Provider Notes (Signed)
History    CSN: [id number] Arrival date & time 05/09/13  1624  First MD Initiated Contact with Patient 05/09/13 1627     Chief Complaint  Patient presents with  . Shortness of Breath   (Consider location/radiation/quality/duration/timing/severity/associated sxs/prior Treatment) Patient is a 79 y.o. female presenting with shortness of breath.  Shortness of Breath Joanna [name] 79 y.o. with a history of hypertension, diabetes, congestive heart failure, and severe aortic stenosis presents from the cardiology clinic for sudden onset of shortness of breath. History is limited to the hospital staff as the patient is combative and short of breath. Patient presented for a usual followup today in the cardiology clinic. She was noted to have an increase in weight gain approximately 4-5 pounds. As the patient was exiting, she reportedly had an acute onset of shortness of breath. Shortness of breath is worsening. Shortness of breath associated with diaphoresis. No known exacerbating or relieving factors at this time. No interventions given in clinic. Past Medical History  Diagnosis Date  . HTN (hypertension)   . Diabetes mellitus   . Peripheral artery disease   . Hyperlipidemia   . Anemia   . PONV (postoperative nausea and vomiting)   . Shortness of breath   . CHF (congestive heart failure)   . Arthritis   . Aortic stenosis 09/21/2011  . AORTIC VALVE SCLEROSIS 02/21/2010  . 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 BUNDLE BRANCH BLOCK, LEFT 07/30/2007  . CAROTID ARTERY STENOSIS 01/05/2009  . CVA 03/14/2010  . DM 07/30/2007  . Edema 04/07/2008  . Herpes zoster without mention of complication 09/29/2007  . 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 HYPERLIPIDEMIA 07/30/2007  . HYPERTENSION 07/30/2007  . Iron deficiency anemia 09/21/2011  . PERIPHERAL VASCULAR DISEASE 07/30/2007  . POLYPECTOMY, HX OF 07/30/2007  . PULMONARY HYPERTENSION 03/23/2009  . SKIN RASH, ALLERGIC 04/07/2008   Past Surgical History   Procedure Laterality Date  . Carotid endarterectomy    . Polypectomy  12/27/04  . Cataract extraction  2009   Family History  Problem Relation Age of Onset  . Colon cancer Mother   . Diabetes Neg Hx   . Coronary artery disease Neg Hx    History  Substance Use Topics  . Smoking status: Never Smoker   . Smokeless tobacco: Never Used  . Alcohol Use: No   OB History   Grav Para Term Preterm Abortions TAB SAB Ect Mult Living                 Review of Systems  Unable to perform ROS Respiratory: Positive for shortness of breath.     Allergies  Other and Prednisone  Home Medications   No current outpatient prescriptions on file. BP 114/57  Pulse 91  Temp(Src) 97.8 F (36.6 C) (Oral)  Resp 19  Ht 5\' 5"  (1.651 m)  Wt 140 lb 10.5 oz (63.8 kg)  BMI 23.41 kg/m2  SpO2 96% Physical Exam  Constitutional: She is oriented to person, place, and time. She appears well-developed and well-nourished. She appears distressed.  HENT:  Head: Normocephalic and atraumatic.  Right Ear: External ear normal.  Left Ear: External ear normal.  Eyes: Conjunctivae and EOM are normal. Pupils are equal, round, and reactive to light. Right eye exhibits no discharge. Left eye exhibits no discharge.  Neck: Normal range of motion. Neck supple. No JVD present.  Cardiovascular: Regular rhythm and normal heart sounds.  Tachycardia present.  Exam reveals no friction rub.   Pulses:      Dorsalis  pedis pulses are 2+ on the right side, and 2+ on the left side.  Heart sounds difficult to assess due to patient is a combative Systolic murmur at the right upper sternal border  Pulmonary/Chest: No stridor. She is in respiratory distress (Tachypneic, accessory muscle usage). She has no wheezes. She has rales (Diffusely bilaterally). She exhibits no tenderness.  Abdominal: Soft. Bowel sounds are normal. She exhibits no distension. There is no tenderness. There is no rebound and no guarding.  Musculoskeletal: Normal  range of motion. She exhibits no edema.  Neurological: She is alert and oriented to person, place, and time.  Skin: Skin is warm. No rash noted. She is diaphoretic.  Psychiatric: Her mood appears anxious. She is combative (Minimal).    ED Course  Procedures (including critical care time) Labs Reviewed  BASIC METABOLIC PANEL - Abnormal; Notable for the following:    Potassium 5.3 (*)    Glucose, Bld 336 (*)    BUN 32 (*)    GFR calc non Af Amer 49 (*)    GFR calc Af Amer 57 (*)    All other components within normal limits  PRO B NATRIURETIC PEPTIDE - Abnormal; Notable for the following:    Pro B Natriuretic peptide (BNP) 4292.0 (*)    All other components within normal limits  CBC WITH DIFFERENTIAL - Abnormal; Notable for the following:    WBC 18.5 (*)    Lymphocytes Relative 51 (*)    Neutro Abs 8.0 (*)    Lymphs Abs 9.4 (*)    All other components within normal limits  URINALYSIS, ROUTINE W REFLEX MICROSCOPIC - Abnormal; Notable for the following:    APPearance HAZY (*)    Glucose, UA 500 (*)    Hgb urine dipstick LARGE (*)    Protein, ur >300 (*)    Leukocytes, UA SMALL (*)    All other components within normal limits  URINE MICROSCOPIC-ADD ON - Abnormal; Notable for the following:    Squamous Epithelial / LPF MANY (*)    Bacteria, UA FEW (*)    All other components within normal limits  TROPONIN I - Abnormal; Notable for the following:    Troponin I 1.57 (*)    All other components within normal limits  POCT I-STAT, CHEM 8 - Abnormal; Notable for the following:    Potassium 5.2 (*)    BUN 35 (*)    Creatinine, Ser 1.30 (*)    Glucose, Bld 334 (*)    Calcium, Ion 1.32 (*)    All other components within normal limits  POCT I-STAT 3, BLOOD GAS (G3+) - Abnormal; Notable for the following:    pH, Arterial 7.285 (*)    pCO2 arterial 56.7 (*)    pO2, Arterial 128.0 (*)    Bicarbonate 27.0 (*)    All other components within normal limits  MRSA PCR SCREENING  URINE  CULTURE  TROPONIN I  CK TOTAL AND CKMB  BASIC METABOLIC PANEL  TROPONIN I  CBC  TROPONIN I  TSH  POCT I-STAT TROPONIN I   Dg Chest Portable 1 View  05/09/2013   *RADIOLOGY REPORT*  Clinical Data: Respiratory distress and shortness of breath.  PORTABLE CHEST - 1 VIEW  Comparison: 09/16/2011  Findings: The heart is enlarged.  External defibrillator pads overlie the patient, obscuring detail.  There is persistent density at the lung bases although there has been some improvement in aeration.  Persistent interstitial edema.  Suspect a left-sided pleural effusion.  IMPRESSION:  1.  Cardiomegaly and interstitial edema. 2.  Improved aeration at the lung bases.   Original Report Authenticated By: Norva Pavlov, M.D.   1. Acute respiratory failure   2. Acute pulmonary edema   3. Aortic stenosis    Results for orders placed during the hospital encounter of 05/09/13  MRSA PCR SCREENING      Result Value Range   MRSA by PCR NEGATIVE  NEGATIVE  BASIC METABOLIC PANEL      Result Value Range   Sodium 137  135 - 145 mEq/L   Potassium 5.3 (*) 3.5 - 5.1 mEq/L   Chloride 98  96 - 112 mEq/L   CO2 22  19 - 32 mEq/L   Glucose, Bld 336 (*) 70 - 99 mg/dL   BUN 32 (*) 6 - 23 mg/dL   Creatinine, Ser 1.19  0.50 - 1.10 mg/dL   Calcium 14.7  8.4 - 82.9 mg/dL   GFR calc non Af Amer 49 (*) >90 mL/min   GFR calc Af Amer 57 (*) >90 mL/min  PRO B NATRIURETIC PEPTIDE      Result Value Range   Pro B Natriuretic peptide (BNP) 4292.0 (*) 0 - 450 pg/mL  CBC WITH DIFFERENTIAL      Result Value Range   WBC 18.5 (*) 4.0 - 10.5 K/uL   RBC 4.22  3.87 - 5.11 MIL/uL   Hemoglobin 13.2  12.0 - 15.0 g/dL   HCT 56.2  13.0 - 86.5 %   MCV 96.9  78.0 - 100.0 fL   MCH 31.3  26.0 - 34.0 pg   MCHC 32.3  30.0 - 36.0 g/dL   RDW 78.4  69.6 - 29.5 %   Platelets 267  150 - 400 K/uL   Neutrophils Relative % 43  43 - 77 %   Lymphocytes Relative 51 (*) 12 - 46 %   Monocytes Relative 5  3 - 12 %   Eosinophils Relative 1  0 - 5 %    Basophils Relative 0  0 - 1 %   Neutro Abs 8.0 (*) 1.7 - 7.7 K/uL   Lymphs Abs 9.4 (*) 0.7 - 4.0 K/uL   Monocytes Absolute 0.9  0.1 - 1.0 K/uL   Eosinophils Absolute 0.2  0.0 - 0.7 K/uL   Basophils Absolute 0.0  0.0 - 0.1 K/uL   WBC Morphology ABSOLUTE LYMPHOCYTOSIS    TROPONIN I      Result Value Range   Troponin I <0.30  <0.30 ng/mL  CK TOTAL AND CKMB      Result Value Range   Total CK 119  7 - 177 U/L   CK, MB 2.4  0.3 - 4.0 ng/mL   Relative Index 2.0  0.0 - 2.5  URINALYSIS, ROUTINE W REFLEX MICROSCOPIC      Result Value Range   Color, Urine YELLOW  YELLOW   APPearance HAZY (*) CLEAR   Specific Gravity, Urine 1.017  1.005 - 1.030   pH 7.0  5.0 - 8.0   Glucose, UA 500 (*) NEGATIVE mg/dL   Hgb urine dipstick LARGE (*) NEGATIVE   Bilirubin Urine NEGATIVE  NEGATIVE   Ketones, ur NEGATIVE  NEGATIVE mg/dL   Protein, ur >284 (*) NEGATIVE mg/dL   Urobilinogen, UA 1.0  0.0 - 1.0 mg/dL   Nitrite NEGATIVE  NEGATIVE   Leukocytes, UA SMALL (*) NEGATIVE  URINE MICROSCOPIC-ADD ON      Result Value Range   Squamous Epithelial / LPF MANY (*) RARE  WBC, UA 7-10  <3 WBC/hpf   RBC / HPF 7-10  <3 RBC/hpf   Bacteria, UA FEW (*) RARE   Urine-Other MUCOUS PRESENT    TROPONIN I      Result Value Range   Troponin I 1.57 (*) <0.30 ng/mL  POCT I-STAT, CHEM 8      Result Value Range   Sodium 138  135 - 145 mEq/L   Potassium 5.2 (*) 3.5 - 5.1 mEq/L   Chloride 101  96 - 112 mEq/L   BUN 35 (*) 6 - 23 mg/dL   Creatinine, Ser 1.61 (*) 0.50 - 1.10 mg/dL   Glucose, Bld 096 (*) 70 - 99 mg/dL   Calcium, Ion 0.45 (*) 1.13 - 1.30 mmol/L   TCO2 29  0 - 100 mmol/L   Hemoglobin 14.3  12.0 - 15.0 g/dL   HCT 40.9  81.1 - 91.4 %  POCT I-STAT TROPONIN I      Result Value Range   Troponin i, poc 0.00  0.00 - 0.08 ng/mL   Comment 3           POCT I-STAT 3, BLOOD GAS (G3+)      Result Value Range   pH, Arterial 7.285 (*) 7.350 - 7.450   pCO2 arterial 56.7 (*) 35.0 - 45.0 mmHg   pO2, Arterial 128.0  (*) 80.0 - 100.0 mmHg   Bicarbonate 27.0 (*) 20.0 - 24.0 mEq/L   TCO2 29  0 - 100 mmol/L   O2 Saturation 98.0     Acid-base deficit 1.0  0.0 - 2.0 mmol/L   Patient temperature 98.6 F     Collection site RADIAL, ALLEN'S TEST ACCEPTABLE     Drawn by RT     Sample type ARTERIAL      MDM  Jalisa H Mcghie 79 y.o. presents with shortness of breath and acute respiratory distress. It was sudden onset. Patient was combative and slightly agitated initially. Systolic blood pressure initially was 200. Chest x-ray showed diffuse bilateral pulmonary edema. EKG showed a wide complex tachycardia but appeared somewhat similar to previous EKGs in morphology. I suspect an acute CHF exacerbation. Patient was started on BiPAP and nitroglycerin drip. Patient reports improvement of symptoms.  BNP elevated. Troponin negative. Chest x-ray suggestive of pulmonary edema. CHF exacerbation likely. White blood cell count elevated as well, however no focal consolidation noted on chest x-ray. Therefore did not initiate antibiotics. Patient was markedly improved with BiPAP and nitroglycerin. She was transitioned off BiPAP nasal cannula. Admission for acute CHF exacerbation indicated. Patient was admitted to the critical care team. Considering the rapid turnaround with the interventions described above I doubt pulmonary embolism.  Labs, EKG, and imaging reviewed. I discussed this patient's care with my attending, Dr. Jeraldine Loots.  Sena Hitch, MD 05/10/13 [id number]

## 2013-05-09 NOTE — ED Notes (Signed)
Pt brought to ED by Heart failure clinic staff. Pt was in clinic for follow up visit. Pt stood up to leave and suddenly became diaphoretic and SOB.

## 2013-05-09 NOTE — ED Notes (Signed)
[facility] approx 1600 via wheel chair by 2 RN's, pt presented with a NR applied at 15 L, pt diaphoretic, gray in color, and altered mental status. Pt unable to follow commands nor responding to painful or verbal stimulus.  Pt's clothing removed immediately, placed in a gown, on monitor, Zoll pads applied, 2 large bore IV's placed.

## 2013-05-09 NOTE — Progress Notes (Signed)
Pt tx from ED on monitor, with RN. Pt ax4, on 2L O2, and able to move herself to bed. Family at bedside, will continue to monitor.

## 2013-05-09 NOTE — ED Notes (Signed)
Pt very diaphoretic, lethargic, restless, not answering questions, not making meaningful eye contact. EDP and resident to bedside. 3 RN's, EMT, RT all to bedside immediately upon pt's arrival by wheelchair.

## 2013-05-09 NOTE — Progress Notes (Signed)
Patient ID: Joanna [name], female   DOB: Jan 24, 1930, 79 y.o.   MRN: [id number] HPI:  Ms. Logie is a 79 year old woman with aortic stenosis, hypertension, diabetes mellitus type II, hyperlipidemia, and peripheral artery disease.   Admitted 12/12 with acute systolic HF, EF 30%. Cath with normal coronaries and normal cardiac output (see below). AS was mild to moderate. In hospital also found to be iron-deficient. Previous colonoscopies normal. Seen by GI who suggested trial of iron and see if it improved. If not, repeat endo. Stool guaiac was negative.   RA = 8 RV = 58/2/11 PA = 54/22 (37) PCW = 20 Fick cardiac output/index = 4.57/2.67 PVR = 3.7 [place]   D/C from hospital 12/ 3/ 2012. D/C weight 145 (admit weight 160) Admit due to progressive SOB. ARB/Beta blocker initiated and titrated.   12/18/11 ECHO EF 30-35% mod AS mean gradient 27 03/26/12 ECHO EF 35% mod AS mean gradient 27  05/09/13 ECHO EF 30%, mild LVH, moderate MR, septal-lateral dyssynchrony, suspect severe AS with AVA 0.6 and peak/mean gradient 63/33 mmHg.     She returns for follow up today. Denies SOB/PND/Orthopnea. Able to walk up steps without difficulty. Weight at home 135-138 pounds. Compliant with medications. Unable to tolerate up titration of carvedilol due to fatigue. Lives alone. Continues to work part time.   As above, she has been doing quite well with NYHA class II symptoms at home.  However, when she was walking out of the office she suddenly developed diaphoresis and severe dyspnea. No chest pain.  Oxygen saturation in the 90s.  We put on a non-rebreather mask and O2 sat went up to the 90s.  SBP in 160s during this episode.  Patient was taken to the ER.   ECG: NSR, LBBB  ROS: All systems negative except as listed in HPI, PMH and Problem List.  Past Medical History  Diagnosis Date  . HTN (hypertension)   . Diabetes mellitus   . Peripheral artery disease   . Hyperlipidemia   . Anemia   . PONV (postoperative nausea and  vomiting)   . Shortness of breath   . CHF (congestive heart failure)   . Arthritis   . Aortic stenosis 09/21/2011  . AORTIC VALVE SCLEROSIS 02/21/2010  . 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 BUNDLE BRANCH BLOCK, LEFT 07/30/2007  . CAROTID ARTERY STENOSIS 01/05/2009  . CVA 03/14/2010  . DM 07/30/2007  . Edema 04/07/2008  . Herpes zoster without mention of complication 09/29/2007  . 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 HYPERLIPIDEMIA 07/30/2007  . HYPERTENSION 07/30/2007  . Iron deficiency anemia 09/21/2011  . PERIPHERAL VASCULAR DISEASE 07/30/2007  . POLYPECTOMY, HX OF 07/30/2007  . PULMONARY HYPERTENSION 03/23/2009  . SKIN RASH, ALLERGIC 04/07/2008     Current Outpatient Prescriptions  Medication Sig Dispense Refill  . aspirin EC 81 MG tablet Take 81 mg by mouth daily.       . calcium citrate-vitamin D (CITRACAL+D) 315-200 MG-UNIT per tablet Take 2 tablets by mouth 2 (two) times daily.       . carvedilol (COREG) 6.25 MG tablet Take 12.5 mg by mouth 2 (two) times daily. Take 2 tablets 2 x daily. In the morning and afternoon      . carvedilol (COREG) 6.25 MG tablet Take 2 tablets by mouth  twice a day in the morning  and in the afternoon  240 tablet  1  . cholecalciferol (VITAMIN D) 1000 UNITS tablet Take 2,000 Units by mouth daily.        Marland Kitchen  docusate sodium (COLACE) 100 MG capsule Take 100 mg by mouth 2 (two) times daily.        . ferrous sulfate 325 (65 FE) MG tablet Take 325 mg by mouth 2 (two) times daily with a meal.      . fish oil-omega-3 fatty acids 1000 MG capsule Take 1 g by mouth 2 (two) times daily.       . furosemide (LASIX) 40 MG tablet Take 1 tablet by mouth in  the morning and at lunch  time  180 tablet  3  . Garlic 1000 MG CAPS Take 1 capsule by mouth daily.        Marland Kitchen losartan (COZAAR) 100 MG tablet Take 1 tablet by mouth  daily  90 tablet  2  . metFORMIN (GLUCOPHAGE) 1000 MG tablet Take 1 tablet by mouth  twice a day with meals  180 tablet  2  . pioglitazone (ACTOS) 45 MG tablet  Take 1 tablet by mouth  daily.  90 tablet  0  . simvastatin (ZOCOR) 80 MG tablet Take 1 tablet by mouth  daily  90 tablet  2   No current facility-administered medications for this encounter.     PHYSICAL EXAM: Filed Vitals:   05/09/13 1527  BP: 130/82  Pulse: 98  Weight: 144 lb 12.8 oz (65.681 kg)  SpO2: 96%    General:  Elderly Well appearing. No resp difficulty HEENT: normal Neck: supple. JVP 6-7 initially, rose to 12 cm during respiratory distress episode.  Carotids 2+ bilaterally; no bruits. No lymphadenopathy or thryomegaly appreciated. Cor: PMI normal. Regular rate & rhythm. Paradoxically split S2.  3/6 crescendo-decrescendo murmur RUSB with S2 heard. Lungs: clear initially, diffuse wheezes during episode of respiratory distress.  Abdomen: soft, nontender, nondistended. No hepatosplenomegaly. No bruits or masses. Good bowel sounds. Extremities: no cyanosis, clubbing, rash, edema Neuro: alert & orientedx3, cranial nerves grossly intact. Moves all 4 extremities w/o difficulty. Affect pleasant  ASSESSMENT & PLAN: 1. CHF: Chronic systolic CHF, nonischemic cardiomyopathy with EF 30% and septal-lateral dyssynchrony.  She had been quite stable recently with NYHA class II symptoms.  Today, on walking out of clinic it appears that she developed flash pulmonary edema.  I suspect that she does have severe aortic stenosis which predisposed her to this.  We took her to the ER.  - Continue Coreg and losartan at current doses.  - Will likely need IV Lasix in the setting of presumed flash pulmonary edema . - Careful with nitrates in setting of suspected severe AS.   - LBBB with EF < 35%: Patient would be candidate for CRT.  However, with nonischemic cardiomyopathy, if she has severe AS, EF may improve with valve replacement.  May be best to aim therapy first at valve and then re-assess need for CRT.  2. Carotid stenosis: Carotid dopplers 2/14 with 40-59% RICA stenosis, repeat in 2/15.  3.  Hyperlipidemia: Given known vascular disease (carotid stenosis), continue statin.  Will need to check lipids.  4. Aortic stenosis: Suspect she may have low gradient severe aortic stenosis based on today's echo. This may have predisposed her to the flash pulmonary edema episode that she had today.  When she stabilizes, she should have low dose dobutamine stress echo to confirm severe AS.  She would likely be a candidate for traditional AVR.    [name] 05/09/2013 4:42 PM

## 2013-05-09 NOTE — Patient Instructions (Addendum)
Follow up in 1 month   Do the following things EVERYDAY: 1) Weigh yourself in the morning before breakfast. Write it down and keep it in a log. 2) Take your medicines as prescribed 3) Eat low salt foods-Limit salt (sodium) to 2000 mg per day.  4) Stay as active as you can everyday 5) Limit all fluids for the day to less than 2 liters 

## 2013-05-09 NOTE — ED Notes (Signed)
RN unable to take report 

## 2013-05-09 NOTE — ED Notes (Signed)
Pt maintaining her O2 sats on 2L [place], denies SOB, pt a/o x4

## 2013-05-09 NOTE — Consult Note (Signed)
PULMONARY  / CRITICAL CARE MEDICINE  Name: Joanna [name] MRN: [id number] DOB: May 14, 1930    ADMISSION DATE:  05/09/2013 CONSULTATION DATE:  05/09/2013   REFERRING MD :  ER  CHIEF COMPLAINT:  Can't breath  BRIEF PATIENT DESCRIPTION:  79 yo female sent from cardiology office with flash pulmonary edema.  She has hx of aortic stenosis, systolic heart failure, and HTN.  She improved in ER with NTG gtt, lasix, and BiPAP.  PCCM consulted to assist with respiratory management.  SIGNIFICANT EVENTS: 7/21 Admit  STUDIES:  7/21 Echo >> mild LVH, EF 30%, mean AV gradient 33 mmHg, mod MR, mod LA dilation, PAS 55 mmHg  LINES / TUBES: PIV   CULTURES: None  ANTIBIOTICS: None  HISTORY OF PRESENT ILLNESS:   79 yo female with hx if CHF and AS was seen in cardiology office earlier today.  On initial presentation to outpt office she did not have symptoms.  When she was getting ready to leave she developed sudden onset of dyspnea.  She also had a dry cough.  She was sent to ER.  She was started on ntg gtt, BiPAP, and given IV lasix.  Her SBP on arrival to ER was in 180's >> since improved.  Her breathing has improved with therapies in ER.  She denies chest pain, nausea, abdominal pain, fever, sputum, or leg swelling.  She does not recall having discussion about advanced directives or living will before.  PAST MEDICAL HISTORY :  Past Medical History  Diagnosis Date  . HTN (hypertension)   . Diabetes mellitus   . Peripheral artery disease   . Hyperlipidemia   . Anemia   . PONV (postoperative nausea and vomiting)   . Shortness of breath   . CHF (congestive heart failure)   . Arthritis   . Aortic stenosis 09/21/2011  . AORTIC VALVE SCLEROSIS 02/21/2010  . 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 BUNDLE BRANCH BLOCK, LEFT 07/30/2007  . CAROTID ARTERY STENOSIS 01/05/2009  . CVA 03/14/2010  . DM 07/30/2007  . Edema 04/07/2008  . Herpes zoster without mention of complication 09/29/2007  .  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 HYPERLIPIDEMIA 07/30/2007  . HYPERTENSION 07/30/2007  . Iron deficiency anemia 09/21/2011  . PERIPHERAL VASCULAR DISEASE 07/30/2007  . POLYPECTOMY, HX OF 07/30/2007  . PULMONARY HYPERTENSION 03/23/2009  . SKIN RASH, ALLERGIC 04/07/2008   Past Surgical History  Procedure Laterality Date  . Carotid endarterectomy    . Polypectomy  12/27/04  . Cataract extraction  2009   Prior to Admission medications   Medication Sig Start Date End Date Taking? Authorizing Provider  aspirin EC 81 MG tablet Take 81 mg by mouth daily.     Historical Provider, MD  calcium citrate-vitamin D (CITRACAL+D) 315-200 MG-UNIT per tablet Take 2 tablets by mouth 2 (two) times daily.     Historical Provider, MD  carvedilol (COREG) 6.25 MG tablet Take 12.5 mg by mouth 2 (two) times daily. Take 2 tablets 2 x daily. In the morning and afternoon 11/15/12   Jacques Navy, MD  carvedilol (COREG) 6.25 MG tablet Take 2 tablets by mouth  twice a day in the morning  and in the afternoon 04/28/13   Jacques Navy, MD  cholecalciferol (VITAMIN D) 1000 UNITS tablet Take 2,000 Units by mouth daily.      Historical Provider, MD  docusate sodium (COLACE) 100 MG capsule Take 100 mg by mouth 2 (two) times daily.      Historical Provider, MD  ferrous sulfate  325 (65 FE) MG tablet Take 325 mg by mouth 2 (two) times daily with a meal. 10/13/11   Jacques Navy, MD  fish oil-omega-3 fatty acids 1000 MG capsule Take 1 g by mouth 2 (two) times daily.     Historical Provider, MD  furosemide (LASIX) 40 MG tablet Take 1 tablet by mouth in  the morning and at lunch  time 04/28/13   Dolores Patty, MD  Garlic 1000 MG CAPS Take 1 capsule by mouth daily.      Historical Provider, MD  losartan (COZAAR) 100 MG tablet Take 1 tablet by mouth  daily 02/04/13   Jacques Navy, MD  metFORMIN (GLUCOPHAGE) 1000 MG tablet Take 1 tablet by mouth  twice a day with meals 02/15/13   Jacques Navy, MD  pioglitazone (ACTOS)  45 MG tablet Take 1 tablet by mouth  daily. 04/21/13   Jacques Navy, MD  simvastatin (ZOCOR) 80 MG tablet Take 1 tablet by mouth  daily 02/04/13   Jacques Navy, MD   Allergies  Allergen Reactions  . Other Hives    STEROIDS  . Prednisone Hives and Other (See Comments)    She is allergic to all steroids!    FAMILY HISTORY:  Family History  Problem Relation Age of Onset  . Colon cancer Mother   . Diabetes Neg Hx   . Coronary artery disease Neg Hx    SOCIAL HISTORY:  reports that she has never smoked. She has never used smokeless tobacco. She reports that she does not drink alcohol or use illicit drugs.  REVIEW OF SYSTEMS:  Negative except above.  SUBJECTIVE:   VITAL SIGNS: Pulse Rate:  [95-126] 102 (07/21 1730) Resp:  [14-33] 23 (07/21 1730) BP: (130-195)/(80-109) 153/81 mmHg (07/21 1730) SpO2:  [95 %-100 %] 99 % (07/21 1730) Weight:  [144 lb 12.8 oz (65.681 kg)] 144 lb 12.8 oz (65.681 kg) (07/21 1527) BiPAP and FiO2 60%  INTAKE / OUTPUT: Intake/Output   None     PHYSICAL EXAMINATION: General: No distress, no accessory muscle use, speaks in full sentences Neuro:  Alert, CN intact, normal strength, follows commands HEENT:  BiPAP mask on Cardiovascular:  Regular, 3/6 SM Lungs:  B/L crackles, no wheeze Abdomen:  Soft, non tender, decreased bowel sounds Musculoskeletal:  No edema Skin:  No rashes  LABS: CBC Recent Labs     05/09/13  1646  05/09/13  1707  WBC   --   18.5*  HGB  14.3  13.2  HCT  42.0  40.9  PLT   --   267    Coag's No results found for this basename: APTT, INR,  in the last 72 hours  BMET Recent Labs     05/09/13  1646  05/09/13  1707  NA  138  137  K  5.2*  5.3*  CL  101  98  CO2   --   22  BUN  35*  32*  CREATININE  1.30*  1.05  GLUCOSE  334*  336*    Electrolytes Recent Labs     05/09/13  1707  CALCIUM  10.4    Sepsis Markers No results found for this basename: LACTICACIDVEN, PROCALCITON, O2SATVEN,  in the last 72  hours  ABG Recent Labs     05/09/13  1725  PHART  7.285*  PCO2ART  56.7*  PO2ART  128.0*    Liver Enzymes No results found for this basename: AST, ALT, ALKPHOS, BILITOT, ALBUMIN,  in the last 72 hours  Cardiac Enzymes Recent Labs     05/09/13  1707  TROPONINI  <0.30  PROBNP  4292.0*    Glucose No results found for this basename: GLUCAP,  in the last 72 hours  Imaging Dg Chest Portable 1 View  05/09/2013   *RADIOLOGY REPORT*  Clinical Data: Respiratory distress and shortness of breath.  PORTABLE CHEST - 1 VIEW  Comparison: 09/16/2011  Findings: The heart is enlarged.  External defibrillator pads overlie the patient, obscuring detail.  There is persistent density at the lung bases although there has been some improvement in aeration.  Persistent interstitial edema.  Suspect a left-sided pleural effusion.  IMPRESSION:  1.  Cardiomegaly and interstitial edema. 2.  Improved aeration at the lung bases.   Original Report Authenticated By: Norva Pavlov, M.D.       ASSESSMENT / PLAN:  PULMONARY A: Acute hypoxic/hypercapnic respiratory failure 2nd to acute pulmonary edema. P:   -BiPAP prn >> no indication for intubation at this time -f/u CXR -oxygen to keep SpO2 > 92%  CARDIOVASCULAR A: Aortic stenosis >> likely may difficulty at present. Acute on chronic systolic/diastolic heart failure. Hx of HTN, Hyperlipidemia. Hx of LBBB. P:  -NTG gtt per cardiology -continue diuresis per cardiology  RENAL A: Hyperkalemia >> likely related to acidosis. P:   -f/u renal fx, and electrolytes -monitor urine outpt >> keep foley in for now  GASTROINTESTINAL A:  Nutrition. P:   -modified carbohydrate diet   HEMATOLOGIC A: Leukocytosis >> likely related to acute stress. P:  -f/u CBC  INFECTIOUS A:  No evidence for infection. P:   -monitor clinically  ENDOCRINE A:  DM type II with hyperglycemia.   P:   -per primary team  NEUROLOGIC A:  Hx of CVA. P:   -ASA per  primary team  Goals of Care >> advised family to discuss about appropriate goals of care given multiple comorbid conditions and advanced age.  Have asked ER to discuss with cardiology about remaining as primary service, and PCCM will remain as Research scientist (medical).  Critical care time 40 minutes.  Coralyn Helling, MD [facility] Pulmonary/Critical Care 05/09/2013, 6:04 PM Pager:  [phone] After 3pm call: [phone]

## 2013-05-09 NOTE — H&P (Signed)
History and Physical  Patient ID: Joanna [name] MRN: [id number], DOB: 03/04/1930 Date of Encounter: 05/09/2013, 7:07 PM Primary Physician: Illene Regulus, MD Primary Cardiologist: CHF clinic  Chief Complaint: SOB, diaphoresis Reason for Admission: [place]ash pulmonary edema  HPI: Joanna [name] is a 79 year old woman with aortic stenosis, hypertension, diabetes mellitus type II, hyperlipidemia, and peripheral artery disease.    Admitted 12/12 with acute systolic HF, EF 30%. Cath with normal coronaries and normal cardiac output (see below). AS was mild to moderate. In hospital also found to be iron-deficient. Previous colonoscopies normal. Seen by GI who suggested trial of iron and see if it improved. If not, repeat endo. Stool guaiac was negative.  RA = 8 RV = 58/2/11 PA = 54/22 (37) PCW = 20 Fick cardiac output/index = 4.57/2.67 PVR = 3.7 [place]   D/C from hospital 12/ 3/ 2012. D/C weight 145 (admit weight 160) Admit due to progressive SOB. ARB/Beta blocker initiated and titrated. 12/18/11 ECHO EF 30-35% mod AS mean gradient 27  03/26/12 ECHO EF 35% mod AS mean gradient 27  05/09/13 ECHO EF 30%, mild LVH, moderate MR, septal-lateral dyssynchrony, suspect severe AS with AVA 0.6 and peak/mean gradient 63/33 mmHg.   She returned to the office today for followup and went into flash pulmonary edema upon leaving the office. She had SOB and orthopnea last night, but was feeling well today without SOB or LEE. She is still working part-time. Weight at home 135-138 pounds. She had been unable to tolerate up titration of carvedilol due to fatigue. She had been doing quite well with NYHA class II symptoms at home, but when she was walking out of the office she suddenly developed diaphoresis and severe dyspnea. O2 sat was in the 90s and she was placed on a NRB and emergently transferred to the ER. Upon arrival she was diaphoretic, gray in color, and lethargic. She was unable to follow commands and not responding to painful  or verbal stimulus. Initial BP 186/109 (in clinic today was 130/82). She was placed on bipap, IV NTG gtt, and given 20mg  IV Lasix. Her mental status and oxygenation improved with this. She also received calcium gluconate and 5 units of insulin for elevated K of 5.3. Wt 144 in ED prior to Lasix. She currently feels tremendously better and has been able to wean to O2 via [place].  Other labs show glucose of 336, pBNP 4292, troponin neg x 2, WBC 18.5 with absolute lymphocytosis, pH 7.285, pCO2 56, PO2 128. PCCM has seen the patient and feels as though her leukocytosis is related to the acute stress of the event.  Past Medical History  Diagnosis Date  . HTN (hypertension)   . Diabetes mellitus   . Peripheral artery disease   . Hyperlipidemia   . Anemia   . PONV (postoperative nausea and vomiting)   . Shortness of breath   . CHF (congestive heart failure)   . Arthritis   . Aortic stenosis 09/21/2011  . AORTIC VALVE SCLEROSIS 02/21/2010  . 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 BUNDLE BRANCH BLOCK, LEFT 07/30/2007  . CAROTID ARTERY STENOSIS 01/05/2009  . CVA 03/14/2010  . DM 07/30/2007  . Edema 04/07/2008  . Herpes zoster without mention of complication 09/29/2007  . 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 HYPERLIPIDEMIA 07/30/2007  . HYPERTENSION 07/30/2007  . Iron deficiency anemia 09/21/2011  . PERIPHERAL VASCULAR DISEASE 07/30/2007  . POLYPECTOMY, HX OF 07/30/2007  . PULMONARY HYPERTENSION 03/23/2009  . SKIN RASH, ALLERGIC 04/07/2008  Most Recent Cardiac Studies: 2D Echo 05/09/13 - Left ventricle: The cavity size was normal. Wall thickness was increased in a pattern of mild LVH. False chord in LV. There was septal-lateral dyssynchrony. The estimated ejection fraction was 30%. Global hypokinesis worst in the inferior and septal walls. Indeterminant diastolic function. - Aortic valve: Trileaflet; moderately calcified leaflets. Mean gradient: 33mm Hg (S). Peak gradient:71mm Hg (S). Valve area:  0.54cm^2(VTI). - Mitral valve: Moderately calcified annulus. Moderate regurgitation. - Left atrium: The atrium was moderately dilated. - Right ventricle: The cavity size was normal. Systolic function was normal. - Tricuspid valve: Peak RV-RA gradient: 45mm Hg (S). - Pulmonary arteries: PA peak pressure: 55mm Hg (S). - Systemic veins: IVC measured 2.4 cm with some respirophasic variation, suggesting RA pressure 10 mmHg . - Pericardium, extracardiac: A trivial pericardial effusion was identified. Impressions: - Normal LV size with mild LV hypertrophy. EF 30% with septal-lateral dyssynchrony and global hypokinesis, worse in the septal and inferior walls. Normal RV size and systolic function. Moderate MR. At least moderate, cannot rule out low gradient severe aortic stenosis. Moderate pulmonary hypertension. Moderate MR.    Surgical History:  Past Surgical History  Procedure Laterality Date  . Carotid endarterectomy    . Polypectomy  12/27/04  . Cataract extraction  2009     Home Meds: Prior to Admission medications   Medication Sig Start Date End Date Taking? Authorizing Provider  aspirin EC 81 MG tablet Take 81 mg by mouth daily.    Yes Historical Provider, MD  calcium citrate-vitamin D (CITRACAL+D) 315-200 MG-UNIT per tablet Take 2 tablets by mouth 2 (two) times daily.    Yes Historical Provider, MD  carvedilol (COREG) 6.25 MG tablet Take 12.5 mg by mouth 2 (two) times daily with a meal. Morning and afternoon 11/15/12  Yes Jacques Navy, MD  cholecalciferol (VITAMIN D) 1000 UNITS tablet Take 2,000 Units by mouth daily.     Yes Historical Provider, MD  docusate sodium (COLACE) 100 MG capsule Take 100 mg by mouth 2 (two) times daily.     Yes Historical Provider, MD  ferrous sulfate 325 (65 FE) MG tablet Take 325 mg by mouth 2 (two) times daily with a meal. 10/13/11  Yes Jacques Navy, MD  fish oil-omega-3 fatty acids 1000 MG capsule Take 1 g by mouth 2 (two) times daily.    Yes  Historical Provider, MD  furosemide (LASIX) 40 MG tablet Take 40 mg by mouth 2 (two) times daily with breakfast and lunch.   Yes Historical Provider, MD  Garlic 1000 MG CAPS Take 1 capsule by mouth daily.    Yes Historical Provider, MD  losartan (COZAAR) 100 MG tablet Take 100 mg by mouth daily.   Yes Historical Provider, MD  metFORMIN (GLUCOPHAGE) 1000 MG tablet Take 1,000 mg by mouth 2 (two) times daily with a meal.   Yes Historical Provider, MD  pioglitazone (ACTOS) 45 MG tablet Take 45 mg by mouth daily.   Yes Historical Provider, MD  simvastatin (ZOCOR) 80 MG tablet Take 80 mg by mouth at bedtime.   Yes Historical Provider, MD    Allergies:  Allergies  Allergen Reactions  . Other Hives    STEROIDS  . Prednisone Hives and Other (See Comments)    She is allergic to all steroids!    History   Social History  . Marital Status: Divorced    Spouse Name: N/A    Number of Children: N/A  . Years of Education: N/A  Occupational History  . Not on file.   Social History Main Topics  . Smoking status: Never Smoker   . Smokeless tobacco: Never Used  . Alcohol Use: No  . Drug Use: No  . Sexually Active: Not Currently    Birth Control/ Protection: Post-menopausal   Other Topics Concern  . Not on file   Social History Narrative   Patient lives alone here in town   She continues to work, helping to bind and oversew books   She is divorced after 23 years of marriage   1 son- '61, minister; 1 dtr '55; 4 grandchildren   End of life care-provided packet on living well     Family History  Problem Relation Age of Onset  . Colon cancer Mother   . Diabetes Neg Hx   . Coronary artery disease Neg Hx     Review of Systems: General: negative for chills, fever Cardiovascular: see above Dermatological: negative for rash Abdominal: negative for nausea, vomiting All other systems reviewed and are otherwise negative except as noted above.  Labs:   Lab Results  Component Value Date    WBC 18.5* 05/09/2013   HGB 13.2 05/09/2013   HCT 40.9 05/09/2013   MCV 96.9 05/09/2013   PLT 267 05/09/2013    Recent Labs Lab 05/09/13 1707  NA 137  K 5.3*  CL 98  CO2 22  BUN 32*  CREATININE 1.05  CALCIUM 10.4  GLUCOSE 336*    Recent Labs  05/09/13 1707  CKTOTAL 119  CKMB 2.4  TROPONINI <0.30   Lab Results  Component Value Date   CHOL 207* 11/15/2012   HDL 66.00 11/15/2012   LDLCALC 75 11/15/2010   TRIG 138.0 11/15/2012    Radiology/Studies:  Dg Chest Portable 1 View 05/09/2013   *RADIOLOGY REPORT*  Clinical Data: Respiratory distress and shortness of breath.  PORTABLE CHEST - 1 VIEW  Comparison: 09/16/2011  Findings: The heart is enlarged.  External defibrillator pads overlie the patient, obscuring detail.  There is persistent density at the lung bases although there has been some improvement in aeration.  Persistent interstitial edema.  Suspect a left-sided pleural effusion.  IMPRESSION:  1.  Cardiomegaly and interstitial edema. 2.  Improved aeration at the lung bases.   Original Report Authenticated By: Norva Pavlov, M.D.     EKG: ECG: NSR, LBBB 98bpm  Physical Exam: Blood pressure 117/64, pulse 94, resp. rate 21, SpO2 92.00%. General: Well developed, well nourished, in no acute distress. Head: Normocephalic, atraumatic, sclera non-icteric, no xanthomas, nares are without discharge.  Neck: Negative for carotid bruits. JVD not elevated. Lungs: Clear bilaterally to auscultation without wheezes, rales, or rhonchi. Breathing is unlabored. Heart: RRR with S1 S2. No murmurs, rubs, or gallops appreciated. Abdomen: Soft, non-tender, non-distended with normoactive bowel sounds. No hepatomegaly. No rebound/guarding. No obvious abdominal masses. Msk:  Strength and tone appear normal for age. Extremities: No clubbing or cyanosis. No edema.  Distal pedal pulses are 2+ and equal bilaterally. Neuro: Alert and oriented X 3. No focal deficit. No facial asymmetry. Moves all  extremities spontaneously. Psych:  Responds to questions appropriately with a normal affect.    ASSESSMENT AND PLAN:  1. Acute pulmonary edema, suspected due to AS in setting of CHF 2. Acute on chronic systolic CHF, nonischemic cardiomyopathy with EF 30% and septal-lateral dyssynchrony - will discuss medical therapy with MD, including discussion of continuation of nitrates and lasix - Dr. Alford Highland note: "LBBB with EF < 35%: Patient would be candidate  for CRT. However, with nonischemic cardiomyopathy, if she has severe AS, EF may improve with valve replacement. May be best to aim therapy first at valve and then re-assess need for CRT. " 2. Carotid stenosis: Carotid dopplers 2/14 with 40-59% RICA stenosis, repeat due in 2/15.  3. Hyperlipidemia: Given known vascular disease (carotid stenosis), continue statin. Will need to check lipids.  4. Aortic stenosis: Suspect she may have low gradient severe aortic stenosis based on today's echo. This may have predisposed her to the flash pulmonary edema episode that she had today. When she stabilizes, she should have low dose dobutamine stress echo to confirm severe AS. Dr. Shirlee Latch has felt that she would likely be a candidate for traditional AVR.  5. Hyperkalemia: PCCM feels secondary to acidosis. Will follow with measures taken in ED. 6. Leukocytosis: ?related to acute stress. Follow. 7. Diabetes mellitus: acutely hyperglycemic but this may be due to stress reaction. Favor discontinuation of actos. Will place on SSI.  Signed, Ronie Spies PA-C 05/09/2013, 7:07 PM   Attending note:  Patient seen and examined. Reviewed records and discussed case with Ms. Jetta Lout. She was just seen in CHF clinic earlier today (full note and plan eviewed), had subsequent decompensation with respiratory distress, flash pulmonary edema - better in ER after Lasix and BIPAP. Plan is admission for IV diuresis and blood pressure control. Stabilize medically and then plan further  evaluation of probable low-gradient, severe AS with dobutamine echocardiogram. Can then discuss candidacy for open AVR versus TAVR.  Jonelle Sidle, M.D., F.A.C.C.

## 2013-05-09 NOTE — ED Notes (Signed)
Pt more alert and oriented, color returned to pt's face and cheeks, but remained tachypneic and weak.  [facility], she became short of breath, diaphoretic, and dizzy upon standing to go to the bathroom. Pt states she started "feeling bad" upon waking yesterday morning, states "I felt so bad I didn't even go to church yesterday." pt states she laid around the house until time for her appointment today, increase shortness of breath with exertion, and extremely tired.  Pt denies chest pain, N/V, cough, congestion, or back/abd/arm/shoulder/jaw pain

## 2013-05-09 NOTE — ED Notes (Signed)
Family at bedside. 

## 2013-05-10 ENCOUNTER — Inpatient Hospital Stay (HOSPITAL_COMMUNITY): Payer: Medicare Other

## 2013-05-10 DIAGNOSIS — J9600 Acute respiratory failure, unspecified whether with hypoxia or hypercapnia: Secondary | ICD-10-CM

## 2013-05-10 LAB — BASIC METABOLIC PANEL
CO2: 29 mEq/L (ref 19–32)
Calcium: 9.8 mg/dL (ref 8.4–10.5)
Chloride: 100 mEq/L (ref 96–112)
Potassium: 3.2 mEq/L — ABNORMAL LOW (ref 3.5–5.1)
Sodium: 141 mEq/L (ref 135–145)

## 2013-05-10 LAB — CBC
HCT: 32.5 % — ABNORMAL LOW (ref 36.0–46.0)
MCH: 30.4 pg (ref 26.0–34.0)
MCHC: 32.9 g/dL (ref 30.0–36.0)
MCHC: 32.6 g/dL (ref 30.0–36.0)
Platelets: 155 10*3/uL (ref 150–400)
RBC: 3.32 MIL/uL — ABNORMAL LOW (ref 3.87–5.11)
RDW: 14.1 % (ref 11.5–15.5)

## 2013-05-10 LAB — TROPONIN I
Troponin I: 1.57 ng/mL (ref <0.30)
Troponin I: 1.72 ng/mL (ref <0.30)
Troponin I: 2.12 ng/mL (ref <0.30)

## 2013-05-10 LAB — GLUCOSE, CAPILLARY
Glucose-Capillary: 216 mg/dL — ABNORMAL HIGH (ref 70–99)
Glucose-Capillary: 165 mg/dL — ABNORMAL HIGH (ref 70–99)
Glucose-Capillary: 157 mg/dL — ABNORMAL HIGH (ref 70–99)
Glucose-Capillary: 302 mg/dL — ABNORMAL HIGH (ref 70–99)

## 2013-05-10 LAB — TSH: TSH: 1.427 u[IU]/mL (ref 0.350–4.500)

## 2013-05-10 MED ORDER — FUROSEMIDE 10 MG/ML IJ SOLN
40.0000 mg | Freq: Two times a day (BID) | INTRAMUSCULAR | Status: DC
  Administered 2013-05-10 (×2): 40 mg via INTRAVENOUS
  Filled 2013-05-10 (×2): qty 4

## 2013-05-10 MED ORDER — BIOTENE DRY MOUTH MT LIQD
15.0000 mL | Freq: Two times a day (BID) | OROMUCOSAL | Status: DC
  Administered 2013-05-10 – 2013-05-11 (×2): 15 mL via OROMUCOSAL

## 2013-05-10 MED ORDER — POTASSIUM CHLORIDE CRYS ER 20 MEQ PO TBCR
40.0000 meq | EXTENDED_RELEASE_TABLET | Freq: Every day | ORAL | Status: DC
  Administered 2013-05-10 – 2013-05-11 (×2): 40 meq via ORAL
  Filled 2013-05-10 (×3): qty 2

## 2013-05-10 MED ORDER — FUROSEMIDE 10 MG/ML IJ SOLN: 40.0000 mg | Freq: Every day | INTRAMUSCULAR | Status: DC

## 2013-05-10 MED ORDER — POTASSIUM CHLORIDE CRYS ER 20 MEQ PO TBCR
40.0000 meq | EXTENDED_RELEASE_TABLET | Freq: Once | ORAL | Status: AC
  Administered 2013-05-10: 40 meq via ORAL
  Filled 2013-05-10: qty 2

## 2013-05-10 MED ORDER — HEPARIN (PORCINE) IN NACL 100-0.45 UNIT/ML-% IJ SOLN
800.0000 [IU]/h | INTRAMUSCULAR | Status: DC
Start: 2013-05-10 — End: 2013-05-10
  Administered 2013-05-10: 800 [IU]/h via INTRAVENOUS
  Filled 2013-05-10 (×2): qty 250

## 2013-05-10 MED ORDER — ENOXAPARIN SODIUM 40 MG/0.4ML ~~LOC~~ SOLN
40.0000 mg | SUBCUTANEOUS | Status: DC
  Administered 2013-05-10 – 2013-05-11 (×2): 40 mg via SUBCUTANEOUS
  Filled 2013-05-10 (×2): qty 0.4

## 2013-05-10 NOTE — Progress Notes (Signed)
CRITICAL VALUE ALERT  Critical value received:  Triponin  Date of notification:  05/10/2013  Time of notification:  0035  Critical value read back:yes  Nurse who received alert:  Leta Baptist RN  MD notified (1st page):  MD Tresa Endo  Time of first page:  0035  MD notified (2nd page):  Time of second page:  Responding MD:  MD Tresa Endo  Time MD responded:  [phone]

## 2013-05-10 NOTE — Progress Notes (Signed)
ANTICOAGULATION CONSULT NOTE - Initial Consult  Pharmacy Consult for heparin Indication: chest pain/ACS  Allergies  Allergen Reactions  . Other Hives    STEROIDS  . Prednisone Hives and Other (See Comments)    She is allergic to all steroids!    Patient Measurements: Height: 5\' 5"  (165.1 cm) Weight: 140 lb 10.5 oz (63.8 kg) IBW/kg (Calculated) : 57  Vital Signs: Temp: 97.8 F (36.6 C) (07/21 2354) Temp src: Oral (07/21 2354) BP: 114/57 mmHg (07/21 2354) Pulse Rate: 91 (07/21 2354)  Labs:  Recent Labs  05/09/13 1646 05/09/13 1707 05/09/13 2312  HGB 14.3 13.2  --   HCT 42.0 40.9  --   PLT  --  267  --   CREATININE 1.30* 1.05  --   CKTOTAL  --  119  --   CKMB  --  2.4  --   TROPONINI  --  <0.30 1.57*    Estimated Creatinine Clearance: 38.5 ml/min (by C-G formula based on Cr of 1.05).   Medical History: Past Medical History  Diagnosis Date  . HTN (hypertension)   . Diabetes mellitus   . Peripheral artery disease   . Hyperlipidemia   . Anemia   . PONV (postoperative nausea and vomiting)   . Shortness of breath   . CHF (congestive heart failure)   . Arthritis   . Aortic stenosis 09/21/2011  . AORTIC VALVE SCLEROSIS 02/21/2010  . 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 BUNDLE BRANCH BLOCK, LEFT 07/30/2007  . CAROTID ARTERY STENOSIS 01/05/2009  . CVA 03/14/2010  . DM 07/30/2007  . Edema 04/07/2008  . Herpes zoster without mention of complication 09/29/2007  . 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 HYPERLIPIDEMIA 07/30/2007  . HYPERTENSION 07/30/2007  . Iron deficiency anemia 09/21/2011  . PERIPHERAL VASCULAR DISEASE 07/30/2007  . POLYPECTOMY, HX OF 07/30/2007  . PULMONARY HYPERTENSION 03/23/2009  . SKIN RASH, ALLERGIC 04/07/2008    Medications:  Prescriptions prior to admission  Medication Sig Dispense Refill  . aspirin EC 81 MG tablet Take 81 mg by mouth daily.       . calcium citrate-vitamin D (CITRACAL+D) 315-200 MG-UNIT per tablet Take 2 tablets by mouth 2 (two)  times daily.       . carvedilol (COREG) 6.25 MG tablet Take 12.5 mg by mouth 2 (two) times daily with a meal. Morning and afternoon      . cholecalciferol (VITAMIN D) 1000 UNITS tablet Take 2,000 Units by mouth daily.        Marland Kitchen docusate sodium (COLACE) 100 MG capsule Take 100 mg by mouth 2 (two) times daily.        . ferrous sulfate 325 (65 FE) MG tablet Take 325 mg by mouth 2 (two) times daily with a meal.      . fish oil-omega-3 fatty acids 1000 MG capsule Take 1 g by mouth 2 (two) times daily.       . furosemide (LASIX) 40 MG tablet Take 40 mg by mouth 2 (two) times daily with breakfast and lunch.      . Garlic 1000 MG CAPS Take 1 capsule by mouth daily.       Marland Kitchen losartan (COZAAR) 100 MG tablet Take 100 mg by mouth daily.      . metFORMIN (GLUCOPHAGE) 1000 MG tablet Take 1,000 mg by mouth 2 (two) times daily with a meal.      . pioglitazone (ACTOS) 45 MG tablet Take 45 mg by mouth daily.      . simvastatin (ZOCOR) 80  MG tablet Take 80 mg by mouth at bedtime.      . triamcinolone cream (KENALOG) 0.1 % Apply 1 application topically 2 (two) times daily as needed (for eczema).       Scheduled:  . aspirin EC  81 mg Oral Daily  . atorvastatin  40 mg Oral QHS  . calcium-vitamin D  1 tablet Oral BID  . carvedilol  12.5 mg Oral BID WC  . cholecalciferol  2,000 Units Oral Daily  . docusate sodium  100 mg Oral BID  . etomidate      . ferrous sulfate  325 mg Oral BID WC  . furosemide  40 mg Intravenous BID  . heparin  5,000 Units Subcutaneous Q8H  . insulin aspart  0-5 Units Subcutaneous QHS  . insulin aspart  0-9 Units Subcutaneous TID WC  . lidocaine (cardiac) 100 mg/72ml      . losartan  100 mg Oral Daily  . omega-3 acid ethyl esters  1 g Oral BID  . rocuronium      . sodium chloride  3 mL Intravenous Q12H  . succinylcholine        Assessment: 79yo female sent to ED from HF clinic for pulmonary edema and acute on chronic CHF, now with elevated troponin (initial negative), to begin  heparin.  Goal of Therapy:  Heparin level 0.3-0.7 units/ml Monitor platelets by anticoagulation protocol: Yes   Plan:  Will begin heparin gtt at 800 units/hr (without bolus per MD request) and monitor heparin levels and CBC.  Vernard Gambles, PharmD, BCPS  05/10/2013,1:26 AM

## 2013-05-10 NOTE — Progress Notes (Signed)
Utilization review completed.  

## 2013-05-10 NOTE — ED Provider Notes (Signed)
I saw this patient with the resident physician. On initial exam this patient, who presented from cardiology clinic with sudden dyspnea with altered mental status was in distress, diaphoretic, obtunded. With a document a history of heart failure, concern for fluid overload status, and with her hypertension, the rapid onset of symptoms, there suspicion for flash pulmonary edema.  With these thoughts, we emergently provided BiPAP, and the patient was started on a nitroglycerin drip.  Subsequently, the patient's blood pressure improved, approximately 20%, and after a period on BiPAP cognition improved substantially.  After discussion with both critical care and cardiology, the patient was admitted to an ICU bed for further evaluation and management.  I saw all studies, including the ECG and bedside XR - and agree with the interpretation.  I reviewed the patient's chart.  CRITICAL CARE Performed by: Gerhard Munch Total critical care time: 40 Critical care time was exclusive of separately billable procedures and treating other patients. Critical care was necessary to treat or prevent imminent or life-threatening deterioration. Critical care was time spent personally by me on the following activities: development of treatment plan with patient and/or surrogate as well as nursing, discussions with consultants, evaluation of patient's response to treatment, examination of patient, obtaining history from patient or surrogate, ordering and performing treatments and interventions, ordering and review of laboratory studies, ordering and review of radiographic studies, pulse oximetry and re-evaluation of patient's condition.   Gerhard Munch, MD 05/10/13 [phone]

## 2013-05-10 NOTE — Progress Notes (Signed)
Patient has been transferred to 4E bed 2 via wheelchair. Phone report has been called to Annie,RN. Patient and son are aware of the transfer.

## 2013-05-10 NOTE — Consult Note (Signed)
Advanced Heart Failure Team Consult Note  Primary Physician: Dr. Oliver Barre Primary Cardiologist:  Dr. Gala Romney  Reason for Consultation: Flash pulmonary edema  HPI:    Joanna [name] is a 79 year old woman with aortic stenosis, hypertension, diabetes mellitus type II, hyperlipidemia, and peripheral artery disease.   Admitted 12/12 with acute systolic HF, EF 30%. Cath with normal coronaries and normal cardiac output (see below). AS was mild to moderate. In hospital also found to be iron-deficient. Previous colonoscopies normal. Seen by GI who suggested trial of iron and see if it improved. If not, repeat endo. Stool guaiac was negative.   RA = 8 RV = 58/2/11 PA = 54/22 (37) PCW = 20 Fick cardiac output/index = 4.57/2.67 PVR = 3.7 Woods   05/09/13 ECHO EF 30%, mild LVH, moderate MR, septal-lateral dyssynchrony, suspect severe AS with AVA 0.6 and peak/mean gradient 63/33 mmHg.   Presented to the HF clinic yesterday and was doing well when she got up to leave and suddenly developed diaphoresis and severe dyspnea. She did not have any CP. Her O2 sats dropped into the 70's, but quickly returned to 90s with O2. Her SBP was 160's during the episode. ECG: NSR, LBBB. She was transferred to the ED. Chest x-ray showed diffuse bilateral pulmonary edema and she was started on Bipap, was given 20 IV lasix, and started on nitroglycerin gtt. Pertinent labs BNP 4292, K+ 5.3 and Troponin 1.72.  Stable overnight. Denies SOB, orthopnea or CP. Reports feeling much better. SBP 115-130s. 24 hour I/O +810. Afebrile. K+ 3.2   Review of Systems: [y] = yes, [ ]  = no   General: Weight gain [ ] ; Weight loss [ ] ; Anorexia [ ] ; Fatigue [ ] ; Fever [ ] ; Chills [ ] ; Weakness [ ]   Cardiac: Chest pain/pressure Klaus.Mock ]; Resting SOB [ N]; Exertional SOB Klaus.Mock ]; Orthopnea [ N]; Pedal Edema [N ]; Palpitations [ ] ; Syncope [ ] ; Presyncope [ ] ; Paroxysmal nocturnal dyspnea[ ]   Pulmonary: Cough [ ] ; Wheezing[ ] ; Hemoptysis[ ] ; Sputum [  ]; Snoring [ ]   GI: Vomiting[ ] ; Dysphagia[ ] ; Melena[ ] ; Hematochezia [ ] ; Heartburn[ ] ; Abdominal pain [ ] ; Constipation [ ] ; Diarrhea [ ] ; BRBPR [ ]   GU: Hematuria[ ] ; Dysuria [ ] ; Nocturia[ ]   Vascular: Pain in legs with walking [ ] ; Pain in feet with lying flat [ ] ; Non-healing sores [ ] ; Stroke [ ] ; TIA [ ] ; Slurred speech [ ] ;  Neuro: Headaches[ ] ; Vertigo[ ] ; Seizures[ ] ; Paresthesias[ ] ;Blurred vision [ ] ; Diplopia [ ] ; Vision changes [ ]   Ortho/Skin: Arthritis [Y ]; Joint pain [ ] ; Muscle pain [ ] ; Joint swelling [ ] ; Back Pain [ ] ; Rash [ ]   Psych: Depression[ ] ; Anxiety[ ]   Heme: Bleeding problems [ ] ; Clotting disorders [ ] ; Anemia [ ]   Endocrine: Diabetes [ ] ; Thyroid dysfunction[ ]   Home Medications Prior to Admission medications   Medication Sig Start Date End Date Taking? Authorizing Provider  aspirin EC 81 MG tablet Take 81 mg by mouth daily.    Yes Historical Provider, MD  calcium citrate-vitamin D (CITRACAL+D) 315-200 MG-UNIT per tablet Take 2 tablets by mouth 2 (two) times daily.    Yes Historical Provider, MD  carvedilol (COREG) 6.25 MG tablet Take 12.5 mg by mouth 2 (two) times daily with a meal. Morning and afternoon 11/15/12  Yes Jacques Navy, MD  cholecalciferol (VITAMIN D) 1000 UNITS tablet Take 2,000 Units by mouth daily.     Yes  Historical Provider, MD  docusate sodium (COLACE) 100 MG capsule Take 100 mg by mouth 2 (two) times daily.     Yes Historical Provider, MD  ferrous sulfate 325 (65 FE) MG tablet Take 325 mg by mouth 2 (two) times daily with a meal. 10/13/11  Yes Jacques Navy, MD  fish oil-omega-3 fatty acids 1000 MG capsule Take 1 g by mouth 2 (two) times daily.    Yes Historical Provider, MD  furosemide (LASIX) 40 MG tablet Take 40 mg by mouth 2 (two) times daily with breakfast and lunch.   Yes Historical Provider, MD  Garlic 1000 MG CAPS Take 1 capsule by mouth daily.    Yes Historical Provider, MD  losartan (COZAAR) 100 MG tablet Take 100 mg by  mouth daily.   Yes Historical Provider, MD  metFORMIN (GLUCOPHAGE) 1000 MG tablet Take 1,000 mg by mouth 2 (two) times daily with a meal.   Yes Historical Provider, MD  pioglitazone (ACTOS) 45 MG tablet Take 45 mg by mouth daily.   Yes Historical Provider, MD  simvastatin (ZOCOR) 80 MG tablet Take 80 mg by mouth at bedtime.   Yes Historical Provider, MD  triamcinolone cream (KENALOG) 0.1 % Apply 1 application topically 2 (two) times daily as needed (for eczema).   Yes Historical Provider, MD    Past Medical History: Past Medical History  Diagnosis Date  . HTN (hypertension)   . Diabetes mellitus   . Peripheral artery disease   . Hyperlipidemia   . Anemia   . PONV (postoperative nausea and vomiting)   . Shortness of breath   . CHF (congestive heart failure)   . Arthritis   . Aortic stenosis 09/21/2011  . AORTIC VALVE SCLEROSIS 02/21/2010  . 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 BUNDLE BRANCH BLOCK, LEFT 07/30/2007  . CAROTID ARTERY STENOSIS 01/05/2009  . CVA 03/14/2010  . DM 07/30/2007  . Edema 04/07/2008  . Herpes zoster without mention of complication 09/29/2007  . 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 HYPERLIPIDEMIA 07/30/2007  . HYPERTENSION 07/30/2007  . Iron deficiency anemia 09/21/2011  . PERIPHERAL VASCULAR DISEASE 07/30/2007  . POLYPECTOMY, HX OF 07/30/2007  . PULMONARY HYPERTENSION 03/23/2009  . SKIN RASH, ALLERGIC 04/07/2008    Past Surgical History: Past Surgical History  Procedure Laterality Date  . Carotid endarterectomy    . Polypectomy  12/27/04  . Cataract extraction  2009    Family History: Family History  Problem Relation Age of Onset  . Colon cancer Mother   . Diabetes Neg Hx   . Coronary artery disease Neg Hx     Social History: History   Social History  . Marital Status: Divorced    Spouse Name: N/A    Number of Children: N/A  . Years of Education: N/A   Social History Main Topics  . Smoking status: Never Smoker   . Smokeless tobacco: Never Used  .  Alcohol Use: No  . Drug Use: No  . Sexually Active: Not Currently    Birth Control/ Protection: Post-menopausal   Other Topics Concern  . None   Social History Narrative   Patient lives alone here in town   She continues to work, helping to bind and oversew books   She is divorced after 23 years of marriage   1 son- '61, minister; 1 dtr '55; 4 grandchildren   End of life care-provided packet on living well    Allergies:  Allergies  Allergen Reactions  . Other Hives    STEROIDS  .  Prednisone Hives and Other (See Comments)    She is allergic to all steroids!    Objective:    Vital Signs:   Temp:  [97.8 F (36.6 C)-98.6 F (37 C)] 98.3 F (36.8 C) (07/22 0733) Pulse Rate:  [72-126] 72 (07/22 0320) Resp:  [8-33] 18 (07/22 0320) BP: (103-195)/(46-109) 121/46 mmHg (07/22 0320) SpO2:  [92 %-100 %] 97 % (07/22 0320) Weight:  [136 lb 11 oz (62 kg)-144 lb 12.8 oz (65.681 kg)] 136 lb 11 oz (62 kg) (07/22 0500) Last BM Date: 05/08/13  Weight change: Filed Weights   05/09/13 2211 05/10/13 0500  Weight: 140 lb 10.5 oz (63.8 kg) 136 lb 11 oz (62 kg)    Intake/Output:   Intake/Output Summary (Last 24 hours) at 05/10/13 0746 Last data filed at 05/10/13 0734  Gross per 24 hour  Intake   2260 ml  Output   1550 ml  Net    710 ml     Physical Exam: General:  Well appearing. No resp difficulty; lying in bed HEENT: normal Neck: supple. JVP 8-9 . Carotids 2+ bilat; no bruits. No lymphadenopathy or thryomegaly appreciated. Cor:PMI normal. Regular rate & rhythm. Paradoxically split S2. 3/6 crescendo-decrescendo murmur RUSB with S2 heard. Lungs: Mild crackles at bases.  Abdomen: soft, nontender, nondistended. No hepatosplenomegaly. No bruits or masses. Good bowel sounds. Extremities: no cyanosis, clubbing, rash, edema Neuro: alert & orientedx3, cranial nerves grossly intact. moves all 4 extremities w/o difficulty. Affect pleasant  Telemetry: SR 60s  Labs: Basic Metabolic  Panel:  Recent Labs Lab 05/09/13 1646 05/09/13 1707 05/10/13 0525  NA 138 137 141  K 5.2* 5.3* 3.2*  CL 101 98 100  CO2  --  22 29  GLUCOSE 334* 336* 170*  BUN 35* 32* 27*  CREATININE 1.30* 1.05 0.85  CALCIUM  --  10.4 9.8    Liver Function Tests: No results found for this basename: AST, ALT, ALKPHOS, BILITOT, PROT, ALBUMIN,  in the last 168 hours No results found for this basename: LIPASE, AMYLASE,  in the last 168 hours No results found for this basename: AMMONIA,  in the last 168 hours  CBC:  Recent Labs Lab 05/09/13 1646 05/09/13 1707 05/10/13 0525  WBC  --  18.5* 7.8  NEUTROABS  --  8.0*  --   HGB 14.3 13.2 10.1*  HCT 42.0 40.9 30.7*  MCV  --  96.9 92.5  PLT  --  267 155    Cardiac Enzymes:  Recent Labs Lab 05/09/13 1707 05/09/13 2312 05/10/13 0525  CKTOTAL 119  --   --   CKMB 2.4  --   --   TROPONINI <0.30 1.57* 1.72*    BNP: BNP (last 3 results)  Recent Labs  05/09/13 1707  PROBNP 4292.0*    CBG: No results found for this basename: GLUCAP,  in the last 168 hours  Coagulation Studies: No results found for this basename: LABPROT, INR,  in the last 72 hours   Imaging: Dg Chest Portable 1 View  05/09/2013   *RADIOLOGY REPORT*  Clinical Data: Respiratory distress and shortness of breath.  PORTABLE CHEST - 1 VIEW  Comparison: 09/16/2011  Findings: The heart is enlarged.  External defibrillator pads overlie the patient, obscuring detail.  There is persistent density at the lung bases although there has been some improvement in aeration.  Persistent interstitial edema.  Suspect a left-sided pleural effusion.  IMPRESSION:  1.  Cardiomegaly and interstitial edema. 2.  Improved aeration at the lung bases.  Original Report Authenticated By: Norva Pavlov, M.D.      Medications:     Current Medications: . aspirin EC  81 mg Oral Daily  . atorvastatin  40 mg Oral QHS  . calcium-vitamin D  1 tablet Oral BID  . carvedilol  12.5 mg Oral BID WC   . cholecalciferol  2,000 Units Oral Daily  . docusate sodium  100 mg Oral BID  . ferrous sulfate  325 mg Oral BID WC  . furosemide  40 mg Intravenous BID  . insulin aspart  0-5 Units Subcutaneous QHS  . insulin aspart  0-9 Units Subcutaneous TID WC  . losartan  100 mg Oral Daily  . omega-3 acid ethyl esters  1 g Oral BID  . sodium chloride  3 mL Intravenous Q12H     Infusions: . heparin 800 Units/hr (05/10/13 0700)      Assessment:   1) Acute pulmonary edema 2) A/C systolic HF 3) NICM     -EF 30% 4) Carotid stenosis 5) Aortic stenosis 6) Hyperlipidemia 7) Hypokalemia 8) DM2   Plan/Discussion:    1) Acute pulmonary edema - Possibly due to hemodynamic shifts with suspected severe AS and low EF.  - doing much better this am. - Only mild volume overload, continue Lasix 40 mg IV bid today.   2) A/C systolic HF, NICM with EF 30% Patient had LHC in 2012 with normal coronaries. Troponin this am elevated to 1.72, suspect demand ischemia in setting of pulmonary edema and AS.  - Continue ARB and BB. - Volume status mildly elevated will continue 40 mg IV lasix BID today. - Will replace K+  3) Carotid stenosis: Carotid dopplers 2/14 with 40-59% RICA stenosis, repeat due in 2/15 -continue ASA and statin  4) Aortic stenosis: Suspect she may have low gradient severe aortic stenosis based on yesterday's echo. This may have predisposed her to the flash pulmonary edema episode that she had today. When she stabilizes, she should have low dose dobutamine stress echo to confirm severe AS (will do tomorrow and will hold coreg in the morning). Patient likely needs AVR, could consider TAVR.  May go to telemetry.   [name] 05/10/2013 8:46 AM   Length of Stay: 1  Advanced Heart Failure Team Pager [phone] (M-F; 7a - 4p)  Please contact [facility] for night-coverage after hours (4p -7a ) and weekends on [email or web address]

## 2013-05-10 NOTE — Progress Notes (Signed)
Brief X-Cover Note  Notified of significant troponin. I received signout from primary team about flash pulmonary edema and concern for low flow AS. She had a fairly normal 12/12 LHC. Initial troponin negative now elevated Troponin may be 2/2 significant stress with flash pulmonary edema and significant AS; however, it has been 1.5+ years and underlying CAD in setting of pulmonary edema also could produce similar scenario. Will have pharmacy dose heparin with low dose bolus or no bolus. Trend troponins. NPO. Could also consider LHC with AS study vs. Dobutamine Stress Echo.   Leeann Must, MD

## 2013-05-10 NOTE — Progress Notes (Signed)
1100 Transferred in from 3300 via wheelchair. Kept comfortable in bed safety pr ecautions obseved

## 2013-05-11 ENCOUNTER — Other Ambulatory Visit (HOSPITAL_COMMUNITY): Payer: Medicare Other

## 2013-05-11 DIAGNOSIS — I359 Nonrheumatic aortic valve disorder, unspecified: Secondary | ICD-10-CM

## 2013-05-11 LAB — URINE CULTURE

## 2013-05-11 LAB — CBC
HCT: 34.3 % — ABNORMAL LOW (ref 36.0–46.0)
Platelets: 165 10*3/uL (ref 150–400)
RDW: 14.1 % (ref 11.5–15.5)
WBC: 7.2 10*3/uL (ref 4.0–10.5)

## 2013-05-11 LAB — GLUCOSE, CAPILLARY
Glucose-Capillary: 166 mg/dL — ABNORMAL HIGH (ref 70–99)
Glucose-Capillary: 272 mg/dL — ABNORMAL HIGH (ref 70–99)

## 2013-05-11 LAB — BASIC METABOLIC PANEL
Chloride: 100 mEq/L (ref 96–112)
Creatinine, Ser: 0.93 mg/dL (ref 0.50–1.10)
GFR calc Af Amer: 66 mL/min — ABNORMAL LOW (ref >90)

## 2013-05-11 MED ORDER — FUROSEMIDE 20 MG PO TABS: 60.0000 mg | ORAL_TABLET | Freq: Two times a day (BID) | ORAL | Status: DC

## 2013-05-11 MED ORDER — DOBUTAMINE INFUSION FOR EP/ECHO/NUC (1000 MCG/ML)
0.0000 ug/kg/min | INTRAVENOUS | Status: DC
  Administered 2013-05-11: 10 ug/kg/min via INTRAVENOUS
  Filled 2013-05-11: qty 250

## 2013-05-11 MED ORDER — FUROSEMIDE 40 MG PO TABS
60.0000 mg | ORAL_TABLET | Freq: Two times a day (BID) | ORAL | Status: DC
  Administered 2013-05-11: 60 mg via ORAL
  Filled 2013-05-11 (×3): qty 1

## 2013-05-11 NOTE — OR Nursing (Signed)
A time out was preformed for the dobutamine study with Theodore Demark PA and consent was signed by the patient.  Patient received dobutamine with a starting dose of 10 mcg/kg and it was stopped with a maximum dose of 15 mcg/kg.  Patient was slightly SOB.  2 liters of oxygen was started. Patient denies chest pain.

## 2013-05-11 NOTE — Progress Notes (Signed)
  Echocardiogram Echocardiogram Pharmacologic Stress Test has been performed.  Joanna [name] 05/11/2013, 11:49 AM

## 2013-05-11 NOTE — Care Management Note (Signed)
    Page 1 of 2   05/11/2013     1:51:09 PM   CARE MANAGEMENT NOTE 05/11/2013  Patient:  Joanna [name], Joanna [name]   Account Number:  [id number]  Date Initiated:  05/11/2013  Documentation initiated by:  Tera Mater  Subjective/Objective Assessment:   79yo female admitted with CHF.  Pt. lives alone, however has assistance from her daughter.     Action/Plan:   discharge planning   Anticipated DC Date:  05/12/2013   Anticipated DC Plan:  HOME W HOME HEALTH SERVICES      DC Planning Services  CM consult      [facility] Choice  HOME HEALTH   Choice offered to / List presented to:  C-4 Adult Children        HH arranged  HH-1 RN  HH-10 DISEASE MANAGEMENT      HH agency  [facility]   Status of service:  In process, will continue to follow Medicare Important Message given?   (If response is "NO", the following Medicare IM given date fields will be blank) Date Medicare IM given:   Date Additional Medicare IM given:    Discharge Disposition:    Per UR Regulation:  Reviewed for med. necessity/level of care/duration of stay  If discussed at Long Length of Stay Meetings, dates discussed:    Comments:  05/11/13 1200 In to complete heart failure home health screen with pt. and daughter.  Pt. is interested in having home health services, and after reviewing the agencies, the pt. chose [facility].   TC to Lupita Leash, with [facility], to give referral for [facility] RN for heart failure management.  Pt. states she has a rolling walker.  Pt. may dc home tomorrow. Tera Mater, RN, BSN NCM [phone]

## 2013-05-11 NOTE — Progress Notes (Addendum)
Advanced Heart Failure Rounding Note   Subjective:   Joanna [name] is a 79 year old woman with aortic stenosis, hypertension, diabetes mellitus type II, hyperlipidemia, and peripheral artery disease.   Admitted 12/12 with acute systolic HF, EF 30%. Cath with normal coronaries and normal cardiac output (see below). AS was mild to moderate. In hospital also found to be iron-deficient. Previous colonoscopies normal. Seen by GI who suggested trial of iron and see if it improved. If not, repeat endo. Stool guaiac was negative.   RA = 8 RV = 58/2/11 PA = 54/22 (37) PCW = 20 Fick cardiac output/index = 4.57/2.67 PVR = 3.7 Woods  05/09/13 ECHO EF 30%, mild LVH, moderate MR, septal-lateral dyssynchrony, suspect severe AS with AVA 0.6 and peak/mean gradient 63/33 mmHg.   Presented to the HF clinic yesterday and was doing well when she got up to leave and suddenly developed diaphoresis and severe dyspnea. She did not have any CP. Her O2 sats dropped into the 70's, but quickly returned to 90s with O2. Her SBP was 160's during the episode. ECG: NSR, LBBB. She was transferred to the ED. Chest x-ray showed diffuse bilateral pulmonary edema and she was started on Bipap, was given 20 IV lasix, and started on nitroglycerin gtt. Pertinent labs BNP 4292, K+ 5.3 and Troponin 1.72.  Stable overnight.   Received 40 mg IV lasix BID yesterday. Weight 135 lbs    24 hour I/O -3 liters. Cr stable. Feels much better. Denies SOB, orthopnea or CP. SBP 115-130s.  Objective:   Weight Range:  Vital Signs:   Temp:  [97.5 F (36.4 C)-98.5 F (36.9 C)] 97.8 F (36.6 C) (07/23 0419) Pulse Rate:  [64-88] 76 (07/23 0419) Resp:  [18-21] 18 (07/23 0419) BP: (114-150)/(41-57) 122/49 mmHg (07/23 0419) SpO2:  [95 %-100 %] 97 % (07/23 0419) Weight:  [61.3 kg (135 lb 2.3 oz)-62.1 kg (136 lb 14.5 oz)] 61.3 kg (135 lb 2.3 oz) (07/23 0419) Last BM Date: 05/10/13  Weight change: Filed Weights   05/10/13 0500 05/10/13 1134 05/11/13 0419   Weight: 62 kg (136 lb 11 oz) 62.1 kg (136 lb 14.5 oz) 61.3 kg (135 lb 2.3 oz)    Intake/Output:   Intake/Output Summary (Last 24 hours) at 05/11/13 0727 Last data filed at 05/11/13 0655  Gross per 24 hour  Intake    586 ml  Output   3670 ml  Net  -3084 ml     Physical Exam: General: Well appearing. No resp difficulty; lying in bed  HEENT: normal  Neck: supple. JVP 7 . Carotids 2+ bilat; no bruits. No lymphadenopathy or thryomegaly appreciated.  Cor:PMI normal. Regular rate & rhythm. Paradoxically split S2. 3/6 crescendo-decrescendo murmur RUSB with S2 heard.  Lungs: CTA.  Abdomen: soft, nontender, nondistended. No hepatosplenomegaly. No bruits or masses. Good bowel sounds.  Extremities: no cyanosis, clubbing, rash, edema  Neuro: alert & orientedx3, cranial nerves grossly intact. moves all 4 extremities w/o difficulty. Affect pleasant   Telemetry: SR 60-70s  Labs: Basic Metabolic Panel:  Recent Labs Lab 05/09/13 1646 05/09/13 1707 05/10/13 0525 05/11/13 0530  NA 138 137 141 140  K 5.2* 5.3* 3.2* 4.2  CL 101 98 100 100  CO2  --  22 29 32  GLUCOSE 334* 336* 170* 174*  BUN 35* 32* 27* 27*  CREATININE 1.30* 1.05 0.85 0.93  CALCIUM  --  10.4 9.8 10.2    Liver Function Tests: No results found for this basename: AST, ALT, ALKPHOS, BILITOT, PROT,   ALBUMIN,  in the last 168 hours No results found for this basename: LIPASE, AMYLASE,  in the last 168 hours No results found for this basename: AMMONIA,  in the last 168 hours  CBC:  Recent Labs Lab 05/09/13 1646 05/09/13 1707 05/10/13 0525 05/10/13 1144 05/11/13 0530  WBC  --  18.5* 7.8 8.3 7.2  NEUTROABS  --  8.0*  --   --   --   HGB 14.3 13.2 10.1* 10.6* 11.3*  HCT 42.0 40.9 30.7* 32.5* 34.3*  MCV  --  96.9 92.5 93.1 93.0  PLT  --  267 155 161 165    Cardiac Enzymes:  Recent Labs Lab 05/09/13 1707 05/09/13 2312 05/10/13 0525 05/10/13 1145  CKTOTAL 119  --   --   --   CKMB 2.4  --   --   --   TROPONINI  <0.30 1.57* 1.72* 2.12*    BNP: BNP (last 3 results)  Recent Labs  05/09/13 1707  PROBNP 4292.0*     Other results:  EKG:   Imaging: Dg Chest Port 1 View  05/10/2013   *RADIOLOGY REPORT*  Clinical Data: Pulmonary edema, follow-up, history hypertension, diabetes, CHF  PORTABLE CHEST - 1 VIEW  Comparison: Portable exam 0643 hours compared to 05/09/2013  Findings: Enlargement of cardiac silhouette with pulmonary vascular congestion. Scattered interstitial infiltrates likely pulmonary edema and CHF question slightly improved. Atherosclerotic calcification aorta. No gross pleural effusion or pneumothorax. Skin folds project over right chest. Numerous EKG leads noted.  IMPRESSION: CHF, question slightly improved.   Original Report Authenticated By: Mark Boles, M.D.   Dg Chest Portable 1 View  05/09/2013   *RADIOLOGY REPORT*  Clinical Data: Respiratory distress and shortness of breath.  PORTABLE CHEST - 1 VIEW  Comparison: 09/16/2011  Findings: The heart is enlarged.  External defibrillator pads overlie the patient, obscuring detail.  There is persistent density at the lung bases although there has been some improvement in aeration.  Persistent interstitial edema.  Suspect a left-sided pleural effusion.  IMPRESSION:  1.  Cardiomegaly and interstitial edema. 2.  Improved aeration at the lung bases.   Original Report Authenticated By: Elizabeth Brown, M.D.     Medications:     Scheduled Medications: . antiseptic oral rinse  15 mL Mouth Rinse BID  . aspirin EC  81 mg Oral Daily  . atorvastatin  40 mg Oral QHS  . calcium-vitamin D  1 tablet Oral BID  . cholecalciferol  2,000 Units Oral Daily  . docusate sodium  100 mg Oral BID  . enoxaparin (LOVENOX) injection  40 mg Subcutaneous Q24H  . ferrous sulfate  325 mg Oral BID WC  . furosemide  40 mg Intravenous BID  . insulin aspart  0-5 Units Subcutaneous QHS  . insulin aspart  0-9 Units Subcutaneous TID WC  . losartan  100 mg Oral Daily  .  omega-3 acid ethyl esters  1 g Oral BID  . potassium chloride  40 mEq Oral Daily  . sodium chloride  3 mL Intravenous Q12H    Infusions:    PRN Medications: sodium chloride, acetaminophen, ondansetron (ZOFRAN) IV, triamcinolone cream   Assessment:   1) Acute pulmonary edema  2) A/C systolic HF  3) NICM  -EF 30%  4) Carotid stenosis  5) Aortic stenosis  6) Hyperlipidemia  7) Hypokalemia  8) DM2    Plan/Discussion:    1) Acute pulmonary edema with suspected severe AS and EF 25-30% with LBBB.  - Possibly due to   hemodynamic shifts with suspected severe AS and low EF.  - diuresed well yesterday - will transition to PO diuretics, lasix 60 mg BID - BP controlled  2) CAD Patient had LHC in 2012 with normal coronaries. Troponin elevated to 1.72, suspect demand ischemia in setting of pulmonary edema and AS. If she were to have valve procedure, would need cath.  - Continue statin, ASA, ARB and BB.   3) Carotid stenosis: Carotid dopplers 2/14 with 40-59% RICA stenosis, repeat due in 2/15  -continue ASA and statin  4) Aortic stenosis: Suspect she may have low gradient severe aortic stenosis based on yesterday's echo. This may have predisposed her to the flash pulmonary edema episode that she had today. Will go for low dose dobutamine stress echo to confirm severe AS. Will hold coreg this am.  Patient may need AVR, could consider TAVR.    Length of Stay: 2   Lilliana Turner 05/11/2013, 7:27 AM  Advanced Heart Failure Team Pager [phone] (M-F; 7a - 4p)  Please contact [facility] for night-coverage after hours (4p -7a ) and weekends on [email or web address]  Low dose dobutamine stress echo suggests low gradient severe AS.  I am going to have her evaluated by Dr. Owen tomorrow in his office for TAVR versus surgical AVR.  Will need LHC prior to any procedure.  She feels well today and is back to baseline.  Has CVTS appt tomorrow, will have appointment in CHF clinic next week.   Emya Picado 05/11/2013 1:35 PM  

## 2013-05-11 NOTE — Progress Notes (Signed)
Patient evaluated for community based chronic disease management services with [facility] Care Management Program as a benefit of patient's [employer]. Patient will receive a post discharge transition of care call and will be evaluated for monthly home visits for assessments and CHF/AFIB disease process education. Spoke with patient and daughter at bedside to explain [facility] Care Management services.  Patient maintains a weight log per daughter but admits that she had weight gain prior to this admission that she did not address.  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  Left contact information and [facility] literature at bedside. Made inpatient Case Manager aware that [facility] Care Management following.  Of note, [facility] Care Management services does not replace or interfere with any services that are arranged by inpatient case management or social work.  For additional questions or referrals please contact Anibal Henderson BSN RN [facility] Liaison at [phone].

## 2013-05-11 NOTE — Progress Notes (Signed)
Dobutamine echo performed. Target HR reached at 15 mcg/kg/min with leg lifts. Final report and images pending.

## 2013-05-12 ENCOUNTER — Encounter: Payer: Self-pay | Admitting: Thoracic Surgery (Cardiothoracic Vascular Surgery)

## 2013-05-12 ENCOUNTER — Institutional Professional Consult (permissible substitution) (INDEPENDENT_AMBULATORY_CARE_PROVIDER_SITE_OTHER): Payer: Medicare Other | Admitting: Thoracic Surgery (Cardiothoracic Vascular Surgery)

## 2013-05-12 VITALS — BP 133/64 | HR 76 | Resp 16 | Ht 65.0 in | Wt 135.0 lb

## 2013-05-12 DIAGNOSIS — I5022 Chronic systolic (congestive) heart failure: Secondary | ICD-10-CM

## 2013-05-12 DIAGNOSIS — I350 Nonrheumatic aortic (valve) stenosis: Secondary | ICD-10-CM

## 2013-05-12 DIAGNOSIS — I359 Nonrheumatic aortic valve disorder, unspecified: Secondary | ICD-10-CM

## 2013-05-12 NOTE — Progress Notes (Addendum)
     [address]       [place] [address]             [phone]     CARDIOTHORACIC SURGERY CONSULTATION REPORT  Referring Provider is McLean, Dalton S, MD PCP is Michael Norins, MD  Chief Complaint  Patient presents with  . Aortic Stenosis    Severe...eval for AVR VS TAVR    HPI:  Patient is an 79-year-old widowed white female from  [place] with aortic stenosis, hypertension, type 2 diabetes mellitus, hyperlipidemia, and peripheral arterial disease. The patient states that she has been told that she had a heart murmur all of her life. She has remained remarkably active and otherwise healthy, and she continues to work part-time and remained completely functionally independent. She first presented with acute systolic congestive heart failure with syncope in November of 2012. At that time echocardiogram revealed moderate aortic stenosis with left ventricular ejection fraction 30%. Left and right heart catheterization was performed demonstrating normal coronary artery anatomy.  There was moderate pulmonary hypertension with normal cardiac output. Mean gradient across the aortic valve at catheterization was measured 12 mm mercury. The patient was also noted to have iron deficient anemia at the time. Stool guaiac was reportedly negative.  The patient was treated medically and has been followed ever since in the heart failure clinic.    The patient returned to the heart failure clinic for routine followup 3 days ago. Transthoracic echocardiogram demonstrated left ventricular ejection fraction 30% with moderate to severe aortic stenosis. Peak velocity across the aortic valve measured 3.7 m/s corresponding to peak and mean transvalvular gradients of 63 and 33 mm mercury respectively. There was moderate mitral regurgitation with moderate pulmonary hypertension, and septal lateral dyssynchrony. At that time the patient reported stable class II symptoms of mild  exertional shortness of breath. However, while she was in the clinic she developed sudden onset of severe diaphoresis and dyspnea and briefly passed out. She did not have any chest pain. Oxygen saturations transiently dropped into the 70s wall systolic blood pressure was elevated to greater than 160.  Electrocardiogram revealed sinus rhythm with left bundle branch block.  She was sent directly to emergency room and subsequently hospitalized.  Chest x-ray showed diffuse pulmonary edema and pro BNP levels were elevated, as were troponin. She was treated with intravenous Lasix and rapidly improved.  Dobutamine stress echocardiogram was performed yesterday and confirmed that the patient's gradient across the aortic valve increased substantially with stress, confirming the presence of severe aortic stenosis.  The patient was discharged from the hospital with instructions to followup in our office today to consider possible surgical intervention.  The patient reports that prior to Monday she had been doing quite well at home. She continues to work part-time, drives an automobile, and remained functionally independent. She walks nearly every day, although recently in hot weather she has been cutting back. Prior to Monday she reports only mild exertional shortness of breath. She denies any history of chest pain or chest tightness either with activity or at rest. She has not had dizzy spells other than the presyncopal episode which occurred this past Monday at the heart failure clinic. She denies PND, orthopnea, palpitations, or lower extremity edema.  Past Medical History  Diagnosis Date  . HTN (hypertension)   . Diabetes mellitus   . Peripheral artery disease   . Hyperlipidemia   . Anemia   . PONV (postoperative nausea and vomiting)   .   Shortness of breath   . CHF (congestive heart failure)   . Arthritis   . Aortic stenosis 09/21/2011  . AORTIC VALVE SCLEROSIS 02/21/2010  . 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  BUNDLE BRANCH BLOCK, LEFT 07/30/2007  . CAROTID ARTERY STENOSIS 01/05/2009  . CVA 03/14/2010  . DM 07/30/2007  . Edema 04/07/2008  . Herpes zoster without mention of complication 09/29/2007  . 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 HYPERLIPIDEMIA 07/30/2007  . HYPERTENSION 07/30/2007  . Iron deficiency anemia 09/21/2011  . PERIPHERAL VASCULAR DISEASE 07/30/2007  . POLYPECTOMY, HX OF 07/30/2007  . PULMONARY HYPERTENSION 03/23/2009  . SKIN RASH, ALLERGIC 04/07/2008    Past Surgical History  Procedure Laterality Date  . Carotid endarterectomy    . Polypectomy  12/27/04  . Cataract extraction  2009    Family History  Problem Relation Age of Onset  . Colon cancer Mother   . Diabetes Neg Hx   . Coronary artery disease Neg Hx     History   Social History  . Marital Status: Divorced    Spouse Name: N/A    Number of Children: N/A  . Years of Education: N/A   Occupational History  . Not on file.   Social History Main Topics  . Smoking status: Never Smoker   . Smokeless tobacco: Never Used  . Alcohol Use: No  . Drug Use: No  . Sexually Active: Not Currently    Birth Control/ Protection: Post-menopausal   Other Topics Concern  . Not on file   Social History Narrative   Patient lives alone here in town   She continues to work, helping to bind and oversew books   She is divorced after 23 years of marriage   1 son- '61, minister; 1 dtr '55; 4 grandchildren   End of life care-provided packet on living well    Current Outpatient Prescriptions  Medication Sig Dispense Refill  . aspirin EC 81 MG tablet Take 81 mg by mouth daily.       . calcium citrate-vitamin D (CITRACAL+D) 315-200 MG-UNIT per tablet Take 2 tablets by mouth 2 (two) times daily.       . carvedilol (COREG) 6.25 MG tablet Take 12.5 mg by mouth 2 (two) times daily with a meal. Morning and afternoon      . cholecalciferol (VITAMIN D) 1000 UNITS tablet Take 2,000 Units by mouth daily.        . docusate sodium (COLACE)  100 MG capsule Take 100 mg by mouth 2 (two) times daily.        . fish oil-omega-3 fatty acids 1000 MG capsule Take 1 g by mouth 2 (two) times daily.       . furosemide (LASIX) 20 MG tablet Take 3 tablets (60 mg total) by mouth 2 (two) times daily.  90 tablet  6  . Garlic 1000 MG CAPS Take 1 capsule by mouth daily.       . losartan (COZAAR) 100 MG tablet Take 100 mg by mouth daily.      . metFORMIN (GLUCOPHAGE) 1000 MG tablet Take 1,000 mg by mouth 2 (two) times daily with a meal.      . pioglitazone (ACTOS) 45 MG tablet Take 45 mg by mouth daily.      . simvastatin (ZOCOR) 80 MG tablet Take 80 mg by mouth at bedtime.      . triamcinolone cream (KENALOG) 0.1 % Apply 1 application topically 2 (two) times daily as needed (for eczema).      .   ferrous sulfate 325 (65 FE) MG tablet Take 325 mg by mouth 2 (two) times daily with a meal.       No current facility-administered medications for this visit.    Allergies  Allergen Reactions  . Other Hives    STEROIDS  . Prednisone Hives and Other (See Comments)    She is allergic to all steroids!      Review of Systems:   General:  Normal appetite, normal energy, sudden 6 pound weight gain within a period of 4 days prior to recent admission, no weight loss, no fever  Cardiac:  no chest pain with exertion, no chest pain at rest, + SOB with moderate exertion, no resting SOB other than that which occurred early this week, - PND, no orthopnea, no palpitations, no arrhythmia, no atrial fibrillation, no LE edema, has had severe dizzy spells with heavy breathing and has actually passed out the most recent being 05/09/2013, + syncope  Respiratory:  Has occasional shortness of breath but copes by sitting down, no home oxygen, no productive cough, no dry cough, no bronchitis, no wheezing, no hemoptysis, no asthma, no pain with inspiration or cough, no sleep apnea, no CPAP at night  GI:   no difficulty swallowing, no reflux, no frequent heartburn, no hiatal  hernia, no abdominal pain, has mild constipation resultant of iron supplements, no diarrhea, no hematochezia, no hematemesis, no melena  GU:   no dysuria,  no frequency, no urinary tract infection, no hematuria, no kidney stones, no kidney disease  Vascular:  no pain suggestive of claudication, no pain in feet, has leg cramps, has varicose veins, no DVT, no non-healing foot ulcer  Neuro:   no stroke, no TIA's, no seizures, no headaches, no temporary blindness one eye,  no slurred speech, no peripheral neuropathy, no chronic pain, no instability of gait, some occasional mild memory/cognitive dysfunction  Musculoskeletal: has arthritis, has joint swelling, no myalgias, very little difficulty walking only occasional and patient tries walking daily, normal mobility   Skin:   no rash, has minor itching, no skin infections, ono pressure sores or ulcerations  Psych:   no anxiety, no depression, no nervousness, no unusual recent stress  Eyes:   no blurry vision, no floaters, no recent vision changes, + wears glasses or contacts  ENT:   no hearing loss, no loose or painful teeth, no dentures, last saw dentist after a routine 6 month followup  Hematologic:  no easy bruising, minor abnormal bleeding, no clotting disorder, no frequent epistaxis  Endocrine:  has diabetes, + checks CBG's at home     Physical Exam:   BP 133/64  Pulse 76  Resp 16  Ht 5' 5" (1.651 m)  Wt 135 lb (61.236 kg)  BMI 22.47 kg/m2  SpO2 98%  General:    well-appearing  HEENT:  Unremarkable   Neck:   no JVD, no bruits, no adenopathy   Chest:   clear to auscultation, symmetrical breath sounds, no wheezes, no rhonchi   CV:   RRR, grade III/VI harsh systolic murmur both at RSB and apex  Abdomen:  soft, non-tender, no masses   Extremities:  warm, well-perfused, pulses diminished, no LE edema, + varicoscities  Rectal/GU  Deferred  Neuro:   Grossly non-focal and symmetrical throughout  Skin:   Clean and dry, no rashes, no  breakdown   Diagnostic Tests:  Transthoracic Echocardiography  Patient:    Rafter, Retia H MR #:       [id number] Study Date: 05/09/2013   Gender:     F Age:        79 Height: Weight: BSA: Pt. Status: Room:    PERFORMING   [facility]  SONOGRAPHER  Elliott, Johanna  ORDERING     Bradley, Robin  REFERRING    Bradley, Robin cc:  ------------------------------------------------------------ LV EF: 30%  ------------------------------------------------------------ Study Conclusions  - Left ventricle: The cavity size was normal. Wall thickness   was increased in a pattern of mild LVH. False chord in LV.   There was septal-lateral dyssynchrony. The estimated   ejection fraction was 30%. Global hypokinesis worst in the   inferior and septal walls. Indeterminant diastolic   function. - Aortic valve: Trileaflet; moderately calcified leaflets.   Mean gradient: 33mm Hg (S). Peak gradient:63mm Hg (S).   Valve area: 0.54cm^2(VTI). - Mitral valve: Moderately calcified annulus. Moderate   regurgitation. - Left atrium: The atrium was moderately dilated. - Right ventricle: The cavity size was normal. Systolic   function was normal. - Tricuspid valve: Peak RV-RA gradient: 45mm Hg (S). - Pulmonary arteries: PA peak pressure: 55mm Hg (S). - Systemic veins: IVC measured 2.4 cm with some   respirophasic variation, suggesting RA pressure 10 mmHg . - Pericardium, extracardiac: A trivial pericardial effusion   was identified. Impressions:  - Normal LV size with mild LV hypertrophy. EF 30% with   septal-lateral dyssynchrony and global hypokinesis, worse   in the septal and inferior walls. Normal RV size and   systolic function. Moderate MR. At least moderate, cannot   rule out low gradient severe aortic stenosis. Moderate   pulmonary hypertension. Moderate MR. Transthoracic echocardiography.  M-mode, complete 2D, spectral Doppler, and color Doppler.  Patient  status: Inpatient.  ------------------------------------------------------------  ------------------------------------------------------------ Left ventricle:  The cavity size was normal. Wall thickness was increased in a pattern of mild LVH. False chord in LV. There was septal-lateral dyssynchrony. The estimated ejection fraction was 30%. Global hypokinesis worst in the inferior and septal walls. Indeterminant diastolic function.   ------------------------------------------------------------ Aortic valve:   Trileaflet; moderately calcified leaflets. Doppler:     Valve area: 0.54cm^2(VTI). Peak velocity ratio of LVOT to aortic valve: 0.24. Valve area: 0.68cm^2 (Vmax).   Mean gradient: 33mm Hg (S). Peak gradient:63mm Hg (S).  ------------------------------------------------------------ Aorta:  Aortic root: The aortic root was normal in size. Ascending aorta: The ascending aorta was normal in size.  ------------------------------------------------------------ Mitral valve:   Moderately calcified annulus.  Doppler: There was no evidence for stenosis.    Moderate regurgitation.  ------------------------------------------------------------ Left atrium:  The atrium was moderately dilated.  ------------------------------------------------------------ Right ventricle:  The cavity size was normal. Systolic function was normal.  ------------------------------------------------------------ Pulmonic valve:    Structurally normal valve.   Cusp separation was normal.  Doppler:  Transvalvular velocity was within the normal range.  Trivial regurgitation.  ------------------------------------------------------------ Tricuspid valve:   Doppler:   Trivial regurgitation.  ------------------------------------------------------------ Right atrium:  The atrium was normal in size.  ------------------------------------------------------------ Pericardium:  A trivial pericardial effusion was  identified.   ------------------------------------------------------------ Systemic veins:  IVC measured 2.4 cm with some respirophasic variation, suggesting RA pressure 10 mmHg .  ------------------------------------------------------------  2D measurements        Normal  Doppler measurements   Normal Left ventricle                 Main pulmonary LVID ED,     58.9 mm   43-52   artery chord, PLAX                      Pressure, S    55 mm   =30 LVID ES,     52.6 mm   23-38                     Hg chord, PLAX                    LVOT FS, chord,     11 %    >29     Peak vel, S  88.4 cm/s ------ PLAX                           Aortic valve LVPW, ED     12.2 mm   ------  Peak vel, S   371 cm/s ------ IVS/LVPW     1.03      <1.3    Mean vel, S   266 cm/s ------ ratio, ED                      VTI, S       93.6 cm   ------ Vol ED, MOD1  258 ml   ------  Mean           32 mm   ------ Vol ES, MOD1  182 ml   ------  gradient, S       Hg EF, MOD1     29.5 %    ------  Peak           55 mm   ------ Ventricular septum             gradient, S       Hg IVS, ED      12.6 mm   ------  Area, VTI    0.54 cm^2 ------ LVOT                           Peak vel     0.24      ------ Diam, S        19 mm   ------  ratio, Area         2.84 cm^2 ------  LVOT/AV Aorta                          Area, Vmax   0.68 cm^2 ------ Root diam,     26 mm   ------  Mitral valve ED                             Regurg alias 58.1 cm/s ------ Left atrium                    vel, PISA AP dim         48 mm   ------  Max regurg    607 cm/s ------                                vel                                Regurg VTI    202 cm   ------                                  ERO, PISA    0.29 cm^2 ------                                Regurg vol,    59 ml   ------                                PISA                                Tricuspid valve                                Regurg peak   337 cm/s ------                                 vel                                Peak RV-RA     45 mm   ------                                gradient, S       Hg                                Max regurg    337 cm/s ------                                vel   ------------------------------------------------------------ Prepared and Electronically Authenticated by  McLean, Dalton 2014-07-21T15:42:[id number]      Stress Echocardiography  Patient:    Hinostroza, Gweneth H MR #:       [id number] Study Date: 05/11/2013 Gender:     F Age:        79 Height:     165.1cm Weight:     55.5kg BSA:        1.59m^2 Pt. Status: Room:       4E02C    PERFORMING   [facility]  ADMITTING    McDowell, Samuel  ATTENDING    McLean, Dalton  SONOGRAPHER  Melissa Morford, RDCS  ORDERING     Cosgrove, Ali cc:  ------------------------------------------------------------  ------------------------------------------------------------ Indications:      Aortic stenosis 424.1.  ------------------------------------------------------------ History:   PMH:   Congestive heart failure.  Stroke.  Risk factors:  Hypertension. Diabetes mellitus. Dyslipidemia.  ------------------------------------------------------------ Study Conclusions  - Procedure narrative: At rest, AVA 0.6 cm2 Max PG 55 mmHg   Mean PG 31 mmHg   At 10 Mcg/Kg/min, AVA 0.64 cm2 Max PG 54 mmHg Mean PG 29   mmHg   At 15 Mcg/Kg/min, AVA 0.61 cm2 Max PG 63 mmHg Mean PG 42   mmHg   At recovery, AVA 0.8 cm2, Max PG 51 mmHg Mean PG 27 mmHg - Left ventricle: EF 25-30% with septal-lateral   dyssynchrony. Mild LV hypertrophy. - Aortic valve: Valve area: 0.67cm^2(VTI). Valve area:   0.61cm^2 (Vmax). Impressions:  - With low dose dobutamine   stress, AVA is unchanged and <   1.0 cm^2. Mean gradient increases to 42 mmHg. This   suggests low gradient severe aortic stenosis. Dobutamine. Stress echocardiography.  2D.  Height:  Height: 165.1cm. Height: 65in.  Weight:  Weight: 55.5kg.  Weight: 122lb.  Body mass index:  BMI: 20.3kg/m^2.  Body surface area:    BSA: 1.59m^2.  Blood pressure:     122/49.  Patient status:  Inpatient.  ------------------------------------------------------------  ------------------------------------------------------------ Left ventricle:  EF 25-30% with septal-lateral dyssynchrony. Mild LV hypertrophy.  ------------------------------------------------------------ Aortic valve:   Doppler:     VTI ratio of LVOT to aortic valve: 0.24. Valve area: 0.67cm^2(VTI). Indexed valve area: 0.42cm^2/m^2 (VTI). Peak velocity ratio of LVOT to aortic valve: 0.21. Valve area: 0.61cm^2 (Vmax). Indexed valve area: 0.38cm^2/m^2 (Vmax).    Mean gradient: 33mm Hg (S). Peak gradient: 57mm Hg (S).   ------------------------------------------------------------ Stress protocol:  +-----------------------+---+------------+--------+ Stage                  HR BP (mmHg)   Symptoms +-----------------------+---+------------+--------+ Baseline               75 135/79 (98) None     +-----------------------+---+------------+--------+ Dobutamine 5 ug/kg/min 79 169/80 (110)-------- +-----------------------+---+------------+--------+ Dobutamine 10 ug/kg/min96 179/84 (116)-------- +-----------------------+---+------------+--------+ Dobutamine 15 ug/kg/min116188/85 (119)-------- +-----------------------+---+------------+--------+ Immediate post stress  117-------------------- +-----------------------+---+------------+--------+ Recovery; 1 min        96 196/87 (123)-------- +-----------------------+---+------------+--------+  ------------------------------------------------------------ Stress results:   Maximal heart rate during stress was 117bpm (86% of maximal predicted heart rate). The maximal predicted heart rate was 140bpm.The target heart rate was achieved. The heart rate response to stress was normal. There was a normal  resting blood pressure. Abnormal blood pressure response to dobutamine. The rate-pressure product for the peak heart rate and blood pressure was 21808mm Hg/min.  ------------------------------------------------------------ Baseline:  Low dose: Peak stress: Recovery:  ------------------------------------------------------------  2D measurements   Normal        Doppler measurements   Norma LVOT                                                   l Diam, S   19 mm   ------        LVOT Area    2.84 cm^2 ------        Peak vel, 80. cm/s     -----                                 S           5                                 VTI, S    18. cm       -----                                             3                                 Aortic valve                                   Peak vel, 376 cm/s     -----                                 S                                 Mean vel, 265 cm/s     -----                                 S                                 VTI, S    77. cm       -----                                             7                                 Mean       33 mm Hg    -----                                 gradient,                                 S                                 Peak       57 mm Hg    -----                                 gradient,                                 S                                 VTI ratio 0.2          -----                                 LVOT/AV     4                                 Area, VTI 0.6 cm^2     -----                                             7                                   Area      0.4 cm^2/m^2 -----                                 index       2                                 (VTI)                                 Peak vel  0.2          -----                                 ratio,      1                                 LVOT/AV                                 Area,     0.6 cm^2     -----                                  Vmax        1                                 Area      0.3 cm^2/m^2 -----                                 index       8                                 (Vmax)   ------------------------------------------------------------ Prepared and Electronically Authenticated by  McLean, Dalton 2014-07-23T13:21:00.330      Cardiac Cath Procedure Note  Indication: Aortic stenosis and heart failure  Procedures performed:  1) Right heart cathererization 2) Selective coronary angiography 3) Left heart catheterization   Description of procedure:      The risks and indication of the procedure were explained. Consent was signed and placed on the chart. An appropriate timeout was taken prior to the procedure. The right groin was prepped and draped in the routine sterile fashion and anesthetized with 1% local lidocaine.   A 6 FR arterial sheath was placed in the right femoral artery using a modified Seldinger technique. Standard catheters including a JL4, JR4 were used. All catheter exchanges were made over a wire. A 7 FR venous sheath was placed in the right femoral vein using a modified Seldinger technique. A standard Swan-Ganz catheter was used for the procedure. The aortic valve was crossed very easily using a 5FR JR4 and long straight wire. Simultaneous LV and FA pressures were obtained.  Complications:  None apparent  Findings:  RA = 8 RV = 58/2/11 PA =  54/22 (37)   PCW = 20 Fick cardiac output/index = 4.57/2.67 PVR = 3.7 Woods FA sat = 95% PA sat = 58%, 61%  Ao Pressure: 119/55 (82)   LV Pressure:  140/10/22  AV gradient: Max 21 mean 12 mmHG   AVA: 1.62cm2   Left main: Large normal  LAD:  Gave off 2 diagonals. 2nd diagonal very large. (larged than LAD). Mid to distal LAD small with mild 30% plaquing  LCX: small. Angiographically normal  RCA: Dominant. Very large. Minimal plaque in the midsection.    Assessment: 1. Minimal non-obstructive CAD 2. CHF with mildly  elevated filling pressures and preserved cardiac output. EF ~ 30% by echo 3. Mild pulmonary HTN 4. Mild aortic stenosis  Plan/Discussion:  Aortic stenosis is mild. CHF relatively well compensated. Medical treatment for HF including titration of ACE-I and b-blocker as tolerated.   Daniel Bensimhon, MD 1:43 PM     STS Risk Calculator  Procedure    AVR  Risk of Mortality   6.9% Morbidity or Mortality  28.6% Prolonged LOS   14.7% Short LOS    13.4% Permanent Stroke   4.0% Prolonged Vent Support  20.2% DSW Infection    0.3% Renal Failure    8.0% Reoperation    11.3%    Impression:  The patient has severe symptomatic aortic stenosis with chronic combined systolic and diastolic congestive heart failure and nonischemic cardiomyopathy with ejection fraction estimated 30%. Previous echocardiograms have demonstrated transvalvular gradients across the aortic valve in the moderate range, but dobutamine echocardiogram clearly demonstrates the presence of contractile reserve and elevated severe transvalvular gradient with stress.  The patient remains remarkably active and functionally independent.  Although her predicted risks with conventional aortic valve replacement would be at least moderately elevated, I expect that she would probably do reasonably well with surgery.  However, transthoracic echocardiogram also demonstrates the presence of significant mitral regurgitation, and previous left and right heart catheterization performed in 2012 was notable for significant pulmonary hypertension.    I am particularly concerned that the patient's mitral regurgitation may be functionally significant as she recently developed somewhat acute onset of flash pulmonary edema associated with hypertension and syncope and there appears to be significant calcification of the posterior mitral annulus on both her echo and her previous catheterization.   Plan:  The patient and her daughter were counseled at  length regarding treatment alternatives for management of severe symptomatic aortic stenosis. Alternative approaches such as conventional aortic valve replacement, transcatheter aortic valve replacement, and palliative medical therapy were compared and contrasted at length.  The risks associated with conventional surgical aortic valve replacement were been discussed in detail, as were expectations for post-operative convalescence. Long-term prognosis with medical therapy was discussed. This discussion was placed in the context of the patient's own specific clinical presentation and past medical history.  All of their questions been addressed.  We will contact Dr. McLean to consider repeat cath and possible transesophageal echocardiogram to further assess the functional anatomy in severity of the patient's underlying mitral regurgitation as well as to more directly estimate the size of her aortic annulus. We will tentatively plan to proceed with surgery in 3 weeks.    Terra Aveni H. Thaily Hackworth, MD 05/12/2013 2:10 PM    

## 2013-05-13 ENCOUNTER — Encounter (HOSPITAL_COMMUNITY): Payer: Self-pay | Admitting: *Deleted

## 2013-05-13 ENCOUNTER — Other Ambulatory Visit: Payer: Self-pay | Admitting: *Deleted

## 2013-05-13 ENCOUNTER — Other Ambulatory Visit (HOSPITAL_COMMUNITY): Payer: Self-pay | Admitting: Anesthesiology

## 2013-05-13 ENCOUNTER — Telehealth (HOSPITAL_COMMUNITY): Payer: Self-pay | Admitting: *Deleted

## 2013-05-13 DIAGNOSIS — I359 Nonrheumatic aortic valve disorder, unspecified: Secondary | ICD-10-CM

## 2013-05-13 DIAGNOSIS — I5022 Chronic systolic (congestive) heart failure: Secondary | ICD-10-CM

## 2013-05-13 DIAGNOSIS — I059 Rheumatic mitral valve disease, unspecified: Secondary | ICD-10-CM

## 2013-05-13 MED FILL — Dobutamine in Dextrose 5% Inj 1 MG/ML: INTRAVENOUS | Qty: 250 | Status: AC

## 2013-05-13 NOTE — Telephone Encounter (Signed)
Pt's daughter [name] aware and instructions reviewed with her via phone

## 2013-05-13 NOTE — Telephone Encounter (Signed)
Per Dr Shirlee Latch pt needs TEE and right and left heart cath, procedures sch for 7/31 10 am and 11:30 am

## 2013-05-16 ENCOUNTER — Telehealth (HOSPITAL_COMMUNITY): Payer: Self-pay | Admitting: *Deleted

## 2013-05-16 DIAGNOSIS — I509 Heart failure, unspecified: Secondary | ICD-10-CM

## 2013-05-16 DIAGNOSIS — I6529 Occlusion and stenosis of unspecified carotid artery: Secondary | ICD-10-CM

## 2013-05-16 DIAGNOSIS — I2589 Other forms of chronic ischemic heart disease: Secondary | ICD-10-CM

## 2013-05-16 NOTE — Telephone Encounter (Signed)
Received telephone call from [facility] who left a message stating that pt has had to hold medications prior to a procedure and doesn't feel well.  Called and left a message for [name] to call back with concerns.

## 2013-05-17 ENCOUNTER — Other Ambulatory Visit (HOSPITAL_COMMUNITY): Payer: Medicare Other

## 2013-05-17 NOTE — Telephone Encounter (Signed)
Spoke with [name] - reviewed meds for pt to continue to hold prior to after procedure.  States understanding and no further questions

## 2013-05-18 ENCOUNTER — Telehealth (HOSPITAL_COMMUNITY): Payer: Self-pay | Admitting: *Deleted

## 2013-05-18 ENCOUNTER — Other Ambulatory Visit: Payer: Self-pay

## 2013-05-18 ENCOUNTER — Other Ambulatory Visit (INDEPENDENT_AMBULATORY_CARE_PROVIDER_SITE_OTHER): Payer: Medicare Other

## 2013-05-18 DIAGNOSIS — Z7901 Long term (current) use of anticoagulants: Secondary | ICD-10-CM

## 2013-05-18 DIAGNOSIS — Z01810 Encounter for preprocedural cardiovascular examination: Secondary | ICD-10-CM

## 2013-05-18 DIAGNOSIS — D500 Iron deficiency anemia secondary to blood loss (chronic): Secondary | ICD-10-CM

## 2013-05-18 DIAGNOSIS — I2789 Other specified pulmonary heart diseases: Secondary | ICD-10-CM

## 2013-05-18 LAB — PROTIME-INR: INR: 1.1 ratio — ABNORMAL HIGH (ref 0.8–1.0)

## 2013-05-18 NOTE — Telephone Encounter (Signed)
Left message for Joanna [name] - pt needs PT/INR today pre-op cath tomorrow.  Requested Joanna [name] call back to discuss lab needs and where the pt can go for lab draw.

## 2013-05-18 NOTE — Telephone Encounter (Signed)
Daughter aware of need for lab work - she will take her today

## 2013-05-19 ENCOUNTER — Ambulatory Visit (HOSPITAL_COMMUNITY)
Admission: RE | Admit: 2013-05-19 | Discharge: 2013-05-19 | Disposition: A | Discharge status: Still In Hospital | Payer: Medicare Other | Source: Ambulatory Visit | Attending: Cardiology | Admitting: Cardiology

## 2013-05-19 ENCOUNTER — Encounter (HOSPITAL_COMMUNITY)
Admission: RE | Disposition: A | Discharge status: Still In Hospital | Payer: Self-pay | Source: Ambulatory Visit | Attending: Cardiology

## 2013-05-19 ENCOUNTER — Encounter (HOSPITAL_COMMUNITY): Payer: Self-pay

## 2013-05-19 ENCOUNTER — Inpatient Hospital Stay (HOSPITAL_BASED_OUTPATIENT_CLINIC_OR_DEPARTMENT_OTHER)
Admission: RE | Admit: 2013-05-19 | Discharge: 2013-05-19 | Disposition: A | Discharge status: Home / Self Care | Payer: Medicare Other | Attending: Cardiology | Admitting: Cardiology

## 2013-05-19 ENCOUNTER — Ambulatory Visit (HOSPITAL_COMMUNITY): Payer: Medicare Other

## 2013-05-19 ENCOUNTER — Encounter (HOSPITAL_BASED_OUTPATIENT_CLINIC_OR_DEPARTMENT_OTHER)
Admission: RE | Disposition: A | Discharge status: Home / Self Care | Payer: Self-pay | Source: Home / Self Care | Attending: Cardiology

## 2013-05-19 DIAGNOSIS — E785 Hyperlipidemia, unspecified: Secondary | ICD-10-CM | POA: Insufficient documentation

## 2013-05-19 DIAGNOSIS — I359 Nonrheumatic aortic valve disorder, unspecified: Secondary | ICD-10-CM

## 2013-05-19 DIAGNOSIS — I079 Rheumatic tricuspid valve disease, unspecified: Secondary | ICD-10-CM | POA: Insufficient documentation

## 2013-05-19 DIAGNOSIS — Z79899 Other long term (current) drug therapy: Secondary | ICD-10-CM | POA: Insufficient documentation

## 2013-05-19 DIAGNOSIS — B029 Zoster without complications: Secondary | ICD-10-CM | POA: Insufficient documentation

## 2013-05-19 DIAGNOSIS — I739 Peripheral vascular disease, unspecified: Secondary | ICD-10-CM | POA: Insufficient documentation

## 2013-05-19 DIAGNOSIS — Z7901 Long term (current) use of anticoagulants: Secondary | ICD-10-CM | POA: Insufficient documentation

## 2013-05-19 DIAGNOSIS — I10 Essential (primary) hypertension: Secondary | ICD-10-CM | POA: Insufficient documentation

## 2013-05-19 DIAGNOSIS — E783 Hyperchylomicronemia: Secondary | ICD-10-CM | POA: Insufficient documentation

## 2013-05-19 DIAGNOSIS — I080 Rheumatic disorders of both mitral and aortic valves: Secondary | ICD-10-CM | POA: Insufficient documentation

## 2013-05-19 DIAGNOSIS — I5022 Chronic systolic (congestive) heart failure: Secondary | ICD-10-CM | POA: Insufficient documentation

## 2013-05-19 DIAGNOSIS — I428 Other cardiomyopathies: Secondary | ICD-10-CM | POA: Insufficient documentation

## 2013-05-19 DIAGNOSIS — J810 Acute pulmonary edema: Secondary | ICD-10-CM | POA: Insufficient documentation

## 2013-05-19 DIAGNOSIS — M129 Arthropathy, unspecified: Secondary | ICD-10-CM | POA: Insufficient documentation

## 2013-05-19 DIAGNOSIS — Z7982 Long term (current) use of aspirin: Secondary | ICD-10-CM | POA: Insufficient documentation

## 2013-05-19 DIAGNOSIS — I6529 Occlusion and stenosis of unspecified carotid artery: Secondary | ICD-10-CM | POA: Insufficient documentation

## 2013-05-19 DIAGNOSIS — Z8673 Personal history of transient ischemic attack (TIA), and cerebral infarction without residual deficits: Secondary | ICD-10-CM | POA: Insufficient documentation

## 2013-05-19 DIAGNOSIS — E876 Hypokalemia: Secondary | ICD-10-CM | POA: Insufficient documentation

## 2013-05-19 DIAGNOSIS — I2789 Other specified pulmonary heart diseases: Secondary | ICD-10-CM | POA: Insufficient documentation

## 2013-05-19 DIAGNOSIS — D509 Iron deficiency anemia, unspecified: Secondary | ICD-10-CM | POA: Insufficient documentation

## 2013-05-19 DIAGNOSIS — I509 Heart failure, unspecified: Secondary | ICD-10-CM | POA: Insufficient documentation

## 2013-05-19 DIAGNOSIS — E119 Type 2 diabetes mellitus without complications: Secondary | ICD-10-CM | POA: Insufficient documentation

## 2013-05-19 DIAGNOSIS — I447 Left bundle-branch block, unspecified: Secondary | ICD-10-CM | POA: Insufficient documentation

## 2013-05-19 HISTORY — PX: TEE WITHOUT CARDIOVERSION: SHX5443

## 2013-05-19 LAB — BASIC METABOLIC PANEL
CO2: 30 mEq/L (ref 19–32)
Chloride: 100 mEq/L (ref 96–112)
Creatinine, Ser: 0.95 mg/dL (ref 0.50–1.10)
Potassium: 4.4 mEq/L (ref 3.5–5.1)

## 2013-05-19 LAB — GLUCOSE, CAPILLARY: Glucose-Capillary: 184 mg/dL — ABNORMAL HIGH (ref 70–99)

## 2013-05-19 SURGERY — JV LEFT AND RIGHT HEART CATHETERIZATION WITH CORONARY ANGIOGRAM

## 2013-05-19 SURGERY — ECHOCARDIOGRAM, TRANSESOPHAGEAL
Anesthesia: Moderate Sedation

## 2013-05-19 MED ORDER — FENTANYL CITRATE 0.05 MG/ML IJ SOLN
INTRAMUSCULAR | Status: AC
  Filled 2013-05-19: qty 2

## 2013-05-19 MED ORDER — SODIUM CHLORIDE 0.9 % IV SOLN: 250.0000 mL | INTRAVENOUS | Status: DC | PRN

## 2013-05-19 MED ORDER — FENTANYL CITRATE 0.05 MG/ML IJ SOLN
INTRAMUSCULAR | Status: DC | PRN
  Administered 2013-05-19: 25 ug via INTRAVENOUS

## 2013-05-19 MED ORDER — SODIUM CHLORIDE 0.9 % IV SOLN: INTRAVENOUS | Status: DC

## 2013-05-19 MED ORDER — SODIUM CHLORIDE 0.9 % IV SOLN
INTRAVENOUS | Status: DC
  Administered 2013-05-19: 09:00:00 via INTRAVENOUS

## 2013-05-19 MED ORDER — ASPIRIN 81 MG PO CHEW
324.0000 mg | CHEWABLE_TABLET | ORAL | Status: AC
  Administered 2013-05-19: 324 mg via ORAL

## 2013-05-19 MED ORDER — SODIUM CHLORIDE 0.9 % IJ SOLN: 3.0000 mL | Freq: Two times a day (BID) | INTRAMUSCULAR | Status: DC

## 2013-05-19 MED ORDER — BUTAMBEN-TETRACAINE-BENZOCAINE 2-2-14 % EX AERO
INHALATION_SPRAY | CUTANEOUS | Status: DC | PRN
  Administered 2013-05-19: 2 via TOPICAL

## 2013-05-19 MED ORDER — MIDAZOLAM HCL 10 MG/2ML IJ SOLN
INTRAMUSCULAR | Status: DC | PRN
  Administered 2013-05-19 (×3): 1 mg via INTRAVENOUS

## 2013-05-19 MED ORDER — MIDAZOLAM HCL 5 MG/ML IJ SOLN
INTRAMUSCULAR | Status: AC
  Filled 2013-05-19: qty 2

## 2013-05-19 MED ORDER — SODIUM CHLORIDE 0.9 % IJ SOLN: 3.0000 mL | INTRAMUSCULAR | Status: DC | PRN

## 2013-05-19 MED ORDER — ACETAMINOPHEN 325 MG PO TABS: 650.0000 mg | ORAL_TABLET | ORAL | Status: DC | PRN

## 2013-05-19 MED ORDER — ONDANSETRON HCL 4 MG/2ML IJ SOLN: 4.0000 mg | Freq: Four times a day (QID) | INTRAMUSCULAR | Status: DC | PRN

## 2013-05-19 NOTE — Progress Notes (Signed)
Bedrest begins @ 1230, tegaderm dressing applied to right groin site by Theodoro Grist, site level 0.

## 2013-05-19 NOTE — H&P (View-Only) (Signed)
Advanced Heart Failure Rounding Note   Subjective:   Joanna [name] is a 79 year old woman with aortic stenosis, hypertension, diabetes mellitus type II, hyperlipidemia, and peripheral artery disease.   Admitted 12/12 with acute systolic HF, EF 30%. Cath with normal coronaries and normal cardiac output (see below). AS was mild to moderate. In hospital also found to be iron-deficient. Previous colonoscopies normal. Seen by GI who suggested trial of iron and see if it improved. If not, repeat endo. Stool guaiac was negative.   RA = 8 RV = 58/2/11 PA = 54/22 (37) PCW = 20 Fick cardiac output/index = 4.57/2.67 PVR = 3.7 Woods  05/09/13 ECHO EF 30%, mild LVH, moderate MR, septal-lateral dyssynchrony, suspect severe AS with AVA 0.6 and peak/mean gradient 63/33 mmHg.   Presented to the HF clinic yesterday and was doing well when she got up to leave and suddenly developed diaphoresis and severe dyspnea. She did not have any CP. Her O2 sats dropped into the 70's, but quickly returned to 90s with O2. Her SBP was 160's during the episode. ECG: NSR, LBBB. She was transferred to the ED. Chest x-ray showed diffuse bilateral pulmonary edema and she was started on Bipap, was given 20 IV lasix, and started on nitroglycerin gtt. Pertinent labs BNP 4292, K+ 5.3 and Troponin 1.72.  Stable overnight.   Received 40 mg IV lasix BID yesterday. Weight 135 lbs    24 hour I/O -3 liters. Cr stable. Feels much better. Denies SOB, orthopnea or CP. SBP 115-130s.  Objective:   Weight Range:  Vital Signs:   Temp:  [97.5 F (36.4 C)-98.5 F (36.9 C)] 97.8 F (36.6 C) (07/23 0419) Pulse Rate:  [64-88] 76 (07/23 0419) Resp:  [18-21] 18 (07/23 0419) BP: (114-150)/(41-57) 122/49 mmHg (07/23 0419) SpO2:  [95 %-100 %] 97 % (07/23 0419) Weight:  [61.3 kg (135 lb 2.3 oz)-62.1 kg (136 lb 14.5 oz)] 61.3 kg (135 lb 2.3 oz) (07/23 0419) Last BM Date: 05/10/13  Weight change: Filed Weights   05/10/13 0500 05/10/13 1134 05/11/13 0419   Weight: 62 kg (136 lb 11 oz) 62.1 kg (136 lb 14.5 oz) 61.3 kg (135 lb 2.3 oz)    Intake/Output:   Intake/Output Summary (Last 24 hours) at 05/11/13 0727 Last data filed at 05/11/13 0655  Gross per 24 hour  Intake    586 ml  Output   3670 ml  Net  -3084 ml     Physical Exam: General: Well appearing. No resp difficulty; lying in bed  HEENT: normal  Neck: supple. JVP 7 . Carotids 2+ bilat; no bruits. No lymphadenopathy or thryomegaly appreciated.  Cor:PMI normal. Regular rate & rhythm. Paradoxically split S2. 3/6 crescendo-decrescendo murmur RUSB with S2 heard.  Lungs: CTA.  Abdomen: soft, nontender, nondistended. No hepatosplenomegaly. No bruits or masses. Good bowel sounds.  Extremities: no cyanosis, clubbing, rash, edema  Neuro: alert & orientedx3, cranial nerves grossly intact. moves all 4 extremities w/o difficulty. Affect pleasant   Telemetry: SR 60-70s  Labs: Basic Metabolic Panel:  Recent Labs Lab 05/09/13 1646 05/09/13 1707 05/10/13 0525 05/11/13 0530  NA 138 137 141 140  K 5.2* 5.3* 3.2* 4.2  CL 101 98 100 100  CO2  --  22 29 32  GLUCOSE 334* 336* 170* 174*  BUN 35* 32* 27* 27*  CREATININE 1.30* 1.05 0.85 0.93  CALCIUM  --  10.4 9.8 10.2    Liver Function Tests: No results found for this basename: AST, ALT, ALKPHOS, BILITOT, PROT,  ALBUMIN,  in the last 168 hours No results found for this basename: LIPASE, AMYLASE,  in the last 168 hours No results found for this basename: AMMONIA,  in the last 168 hours  CBC:  Recent Labs Lab 05/09/13 1646 05/09/13 1707 05/10/13 0525 05/10/13 1144 05/11/13 0530  WBC  --  18.5* 7.8 8.3 7.2  NEUTROABS  --  8.0*  --   --   --   HGB 14.3 13.2 10.1* 10.6* 11.3*  HCT 42.0 40.9 30.7* 32.5* 34.3*  MCV  --  96.9 92.5 93.1 93.0  PLT  --  267 155 161 165    Cardiac Enzymes:  Recent Labs Lab 05/09/13 1707 05/09/13 2312 05/10/13 0525 05/10/13 1145  CKTOTAL 119  --   --   --   CKMB 2.4  --   --   --   TROPONINI  <0.30 1.57* 1.72* 2.12*    BNP: BNP (last 3 results)  Recent Labs  05/09/13 1707  PROBNP 4292.0*     Other results:  EKG:   Imaging: Dg Chest Port 1 View  05/10/2013   *RADIOLOGY REPORT*  Clinical Data: Pulmonary edema, follow-up, history hypertension, diabetes, CHF  PORTABLE CHEST - 1 VIEW  Comparison: Portable exam 0643 hours compared to 05/09/2013  Findings: Enlargement of cardiac silhouette with pulmonary vascular congestion. Scattered interstitial infiltrates likely pulmonary edema and CHF question slightly improved. Atherosclerotic calcification aorta. No gross pleural effusion or pneumothorax. Skin folds project over right chest. Numerous EKG leads noted.  IMPRESSION: CHF, question slightly improved.   Original Report Authenticated By: Ulyses Southward, M.D.   Dg Chest Portable 1 View  05/09/2013   *RADIOLOGY REPORT*  Clinical Data: Respiratory distress and shortness of breath.  PORTABLE CHEST - 1 VIEW  Comparison: 09/16/2011  Findings: The heart is enlarged.  External defibrillator pads overlie the patient, obscuring detail.  There is persistent density at the lung bases although there has been some improvement in aeration.  Persistent interstitial edema.  Suspect a left-sided pleural effusion.  IMPRESSION:  1.  Cardiomegaly and interstitial edema. 2.  Improved aeration at the lung bases.   Original Report Authenticated By: Norva Pavlov, M.D.     Medications:     Scheduled Medications: . antiseptic oral rinse  15 mL Mouth Rinse BID  . aspirin EC  81 mg Oral Daily  . atorvastatin  40 mg Oral QHS  . calcium-vitamin D  1 tablet Oral BID  . cholecalciferol  2,000 Units Oral Daily  . docusate sodium  100 mg Oral BID  . enoxaparin (LOVENOX) injection  40 mg Subcutaneous Q24H  . ferrous sulfate  325 mg Oral BID WC  . furosemide  40 mg Intravenous BID  . insulin aspart  0-5 Units Subcutaneous QHS  . insulin aspart  0-9 Units Subcutaneous TID WC  . losartan  100 mg Oral Daily  .  omega-3 acid ethyl esters  1 g Oral BID  . potassium chloride  40 mEq Oral Daily  . sodium chloride  3 mL Intravenous Q12H    Infusions:    PRN Medications: sodium chloride, acetaminophen, ondansetron (ZOFRAN) IV, triamcinolone cream   Assessment:   1) Acute pulmonary edema  2) A/C systolic HF  3) NICM  -EF 30%  4) Carotid stenosis  5) Aortic stenosis  6) Hyperlipidemia  7) Hypokalemia  8) DM2    Plan/Discussion:    1) Acute pulmonary edema with suspected severe AS and EF 25-30% with LBBB.  - Possibly due to  hemodynamic shifts with suspected severe AS and low EF.  - diuresed well yesterday - will transition to PO diuretics, lasix 60 mg BID - BP controlled  2) CAD Patient had LHC in 2012 with normal coronaries. Troponin elevated to 1.72, suspect demand ischemia in setting of pulmonary edema and AS. If she were to have valve procedure, would need cath.  - Continue statin, ASA, ARB and BB.   3) Carotid stenosis: Carotid dopplers 2/14 with 40-59% RICA stenosis, repeat due in 2/15  -continue ASA and statin  4) Aortic stenosis: Suspect she may have low gradient severe aortic stenosis based on yesterday's echo. This may have predisposed her to the flash pulmonary edema episode that she had today. Will go for low dose dobutamine stress echo to confirm severe AS. Will hold coreg this am.  Patient may need AVR, could consider TAVR.    Length of Stay: 2   [name] 05/11/2013, 7:27 AM  Advanced Heart Failure Team Pager [phone] (M-F; 7a - 4p)  Please contact [facility] for night-coverage after hours (4p -7a ) and weekends on [email or web address]  Low dose dobutamine stress echo suggests low gradient severe AS.  I am going to have her evaluated by Dr. Cornelius Moras tomorrow in his office for TAVR versus surgical AVR.  Will need LHC prior to any procedure.  She feels well today and is back to baseline.  Has CVTS appt tomorrow, will have appointment in CHF clinic next week.   [name] 05/11/2013 1:35 PM

## 2013-05-19 NOTE — CV Procedure (Signed)
Procedure: TEE  Indication: Severe aortic stenosis.  Reassess aortic valve and assess mitral valve.   Sedation: Versed 3 mg IV, Fentanyl 25 mcg IV  Findings: Please see report in echo section of chart for full details.  There was mild to moderate LV hypertrophy.  EF 30-35% with primarily septal and inferior hypokinesis.  Normal RV size and systolic function.  There was calcification of the mitral annulus and calcification and some degree of fixation of the posterior mitral leaflet with moderate mitral regurgitation.  There was severe aortic calcification.  Trileaflet valve.  Severe aortic stenosis (low gradient, 32 mmHg mean obtained by TEE).  Negative bubble study.   Joanna [name] 05/19/2013 10:40 AM

## 2013-05-19 NOTE — Procedures (Signed)
   Cardiac Catheterization Procedure Note  Name: Joanna [name] MRN: [id number] DOB: [birth year]  Procedure: Selective Coronary Angiography  Indication: Severe AS, needs surgery.    Procedural details: The right groin was prepped, draped, and anesthetized with 1% lidocaine. Using modified Seldinger technique, a 4 French sheath was introduced into the right femoral artery. Several attempts were made to access the right femoral vein.  The vein was difficult to identify.  Once identified, I was unable to pass a wire into the vein.  I suspect that the patient had quite low venous pressure in the setting of being NPO and being on Lasix.  I opted not to try the left groin.  Standard Judkins catheters were used for coronary angiography. Catheter exchanges were performed over a guidewire. There were no immediate procedural complications. The patient was transferred to the post catheterization recovery area for further monitoring.  Procedural Findings: Hemodynamics:  AO 136/45   Coronary angiography: Coronary dominance: right  Left mainstem: No significant disease.   Left anterior descending (LAD): Luminal irregularities in the LAD.   Left circumflex (LCx): Large ramus with luminal irregularities. Small AV LCx without significant disease.   Right coronary artery (RCA): Luminal irregularities.  There was severe mitral annular calcification noted.  The aortic valve was calcified.    Left ventriculography: Not done (known severe AS).   Final Conclusions:  No obstructive CAD.  Heavily calcified mitral annulus.  RHC not done: very difficult to access right femoral vein and then unable to pass wire.  Suspect she was dehydrated (NPO and on Lasix).   Opted not to move the left femoral vein.   Recommendations: Will review cath and TEE with Dr. Cornelius Moras.   [name] 05/19/2013, 12:07 PM

## 2013-05-19 NOTE — OR Nursing (Signed)
Meal served 

## 2013-05-19 NOTE — H&P (View-Only) (Signed)
[address]       [name] [address]             [phone]     CARDIOTHORACIC SURGERY CONSULTATION REPORT  Referring Provider is Laurey Morale, MD PCP is Illene Regulus, MD  Chief Complaint  Patient presents with  . Aortic Stenosis    Severe...eval for AVR VS TAVR    HPI:  Patient is an 79 year old widowed white female from [facility] with aortic stenosis, hypertension, type 2 diabetes mellitus, hyperlipidemia, and peripheral arterial disease. The patient states that she has been told that she had a heart murmur all of her life. She has remained remarkably active and otherwise healthy, and she continues to work part-time and remained completely functionally independent. She first presented with acute systolic congestive heart failure with syncope in November of 2012. At that time echocardiogram revealed moderate aortic stenosis with left ventricular ejection fraction 30%. Left and right heart catheterization was performed demonstrating normal coronary artery anatomy.  There was moderate pulmonary hypertension with normal cardiac output. Mean gradient across the aortic valve at catheterization was measured 12 mm mercury. The patient was also noted to have iron deficient anemia at the time. Stool guaiac was reportedly negative.  The patient was treated medically and has been followed ever since in the heart failure clinic.    The patient returned to the heart failure clinic for routine followup 3 days ago. Transthoracic echocardiogram demonstrated left ventricular ejection fraction 30% with moderate to severe aortic stenosis. Peak velocity across the aortic valve measured 3.7 m/s corresponding to peak and mean transvalvular gradients of 63 and 33 mm mercury respectively. There was moderate mitral regurgitation with moderate pulmonary hypertension, and septal lateral dyssynchrony. At that time the patient reported stable class II symptoms of mild  exertional shortness of breath. However, while she was in the clinic she developed sudden onset of severe diaphoresis and dyspnea and briefly passed out. She did not have any chest pain. Oxygen saturations transiently dropped into the 70s wall systolic blood pressure was elevated to greater than 160.  Electrocardiogram revealed sinus rhythm with left bundle branch block.  She was sent directly to emergency room and subsequently hospitalized.  Chest x-ray showed diffuse pulmonary edema and pro BNP levels were elevated, as were troponin. She was treated with intravenous Lasix and rapidly improved.  Dobutamine stress echocardiogram was performed yesterday and confirmed that the patient's gradient across the aortic valve increased substantially with stress, confirming the presence of severe aortic stenosis.  The patient was discharged from the hospital with instructions to followup in our office today to consider possible surgical intervention.  The patient reports that prior to Monday she had been doing quite well at home. She continues to work part-time, drives an automobile, and remained functionally independent. She walks nearly every day, although recently in hot weather she has been cutting back. Prior to Monday she reports only mild exertional shortness of breath. She denies any history of chest pain or chest tightness either with activity or at rest. She has not had dizzy spells other than the presyncopal episode which occurred this past Monday at the heart failure clinic. She denies PND, orthopnea, palpitations, or lower extremity edema.  Past Medical History  Diagnosis Date  . HTN (hypertension)   . Diabetes mellitus   . Peripheral artery disease   . Hyperlipidemia   . Anemia   . PONV (postoperative nausea and vomiting)   .  Shortness of breath   . CHF (congestive heart failure)   . Arthritis   . Aortic stenosis 09/21/2011  . AORTIC VALVE SCLEROSIS 02/21/2010  . 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  BUNDLE BRANCH BLOCK, LEFT 07/30/2007  . CAROTID ARTERY STENOSIS 01/05/2009  . CVA 03/14/2010  . DM 07/30/2007  . Edema 04/07/2008  . Herpes zoster without mention of complication 09/29/2007  . 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 HYPERLIPIDEMIA 07/30/2007  . HYPERTENSION 07/30/2007  . Iron deficiency anemia 09/21/2011  . PERIPHERAL VASCULAR DISEASE 07/30/2007  . POLYPECTOMY, HX OF 07/30/2007  . PULMONARY HYPERTENSION 03/23/2009  . SKIN RASH, ALLERGIC 04/07/2008    Past Surgical History  Procedure Laterality Date  . Carotid endarterectomy    . Polypectomy  12/27/04  . Cataract extraction  2009    Family History  Problem Relation Age of Onset  . Colon cancer Mother   . Diabetes Neg Hx   . Coronary artery disease Neg Hx     History   Social History  . Marital Status: Divorced    Spouse Name: N/A    Number of Children: N/A  . Years of Education: N/A   Occupational History  . Not on file.   Social History Main Topics  . Smoking status: Never Smoker   . Smokeless tobacco: Never Used  . Alcohol Use: No  . Drug Use: No  . Sexually Active: Not Currently    Birth Control/ Protection: Post-menopausal   Other Topics Concern  . Not on file   Social History Narrative   Patient lives alone here in town   She continues to work, helping to bind and oversew books   She is divorced after 23 years of marriage   1 son- '61, minister; 1 dtr '55; 4 grandchildren   End of life care-provided packet on living well    Current Outpatient Prescriptions  Medication Sig Dispense Refill  . aspirin EC 81 MG tablet Take 81 mg by mouth daily.       . calcium citrate-vitamin D (CITRACAL+D) 315-200 MG-UNIT per tablet Take 2 tablets by mouth 2 (two) times daily.       . carvedilol (COREG) 6.25 MG tablet Take 12.5 mg by mouth 2 (two) times daily with a meal. Morning and afternoon      . cholecalciferol (VITAMIN D) 1000 UNITS tablet Take 2,000 Units by mouth daily.        Marland Kitchen docusate sodium (COLACE)  100 MG capsule Take 100 mg by mouth 2 (two) times daily.        . fish oil-omega-3 fatty acids 1000 MG capsule Take 1 g by mouth 2 (two) times daily.       . furosemide (LASIX) 20 MG tablet Take 3 tablets (60 mg total) by mouth 2 (two) times daily.  90 tablet  6  . Garlic 1000 MG CAPS Take 1 capsule by mouth daily.       Marland Kitchen losartan (COZAAR) 100 MG tablet Take 100 mg by mouth daily.      . metFORMIN (GLUCOPHAGE) 1000 MG tablet Take 1,000 mg by mouth 2 (two) times daily with a meal.      . pioglitazone (ACTOS) 45 MG tablet Take 45 mg by mouth daily.      . simvastatin (ZOCOR) 80 MG tablet Take 80 mg by mouth at bedtime.      . triamcinolone cream (KENALOG) 0.1 % Apply 1 application topically 2 (two) times daily as needed (for eczema).      Marland Kitchen  ferrous sulfate 325 (65 FE) MG tablet Take 325 mg by mouth 2 (two) times daily with a meal.       No current facility-administered medications for this visit.    Allergies  Allergen Reactions  . Other Hives    STEROIDS  . Prednisone Hives and Other (See Comments)    She is allergic to all steroids!      Review of Systems:   General:  Normal appetite, normal energy, sudden 6 pound weight gain within a period of 4 days prior to recent admission, no weight loss, no fever  Cardiac:  no chest pain with exertion, no chest pain at rest, + SOB with moderate exertion, no resting SOB other than that which occurred early this week, - PND, no orthopnea, no palpitations, no arrhythmia, no atrial fibrillation, no LE edema, has had severe dizzy spells with heavy breathing and has actually passed out the most recent being 05/09/2013, + syncope  Respiratory:  Has occasional shortness of breath but copes by sitting down, no home oxygen, no productive cough, no dry cough, no bronchitis, no wheezing, no hemoptysis, no asthma, no pain with inspiration or cough, no sleep apnea, no CPAP at night  GI:   no difficulty swallowing, no reflux, no frequent heartburn, no hiatal  hernia, no abdominal pain, has mild constipation resultant of iron supplements, no diarrhea, no hematochezia, no hematemesis, no melena  GU:   no dysuria,  no frequency, no urinary tract infection, no hematuria, no kidney stones, no kidney disease  Vascular:  no pain suggestive of claudication, no pain in feet, has leg cramps, has varicose veins, no DVT, no non-healing foot ulcer  Neuro:   no stroke, no TIA's, no seizures, no headaches, no temporary blindness one eye,  no slurred speech, no peripheral neuropathy, no chronic pain, no instability of gait, some occasional mild memory/cognitive dysfunction  Musculoskeletal: has arthritis, has joint swelling, no myalgias, very little difficulty walking only occasional and patient tries walking daily, normal mobility   Skin:   no rash, has minor itching, no skin infections, ono pressure sores or ulcerations  Psych:   no anxiety, no depression, no nervousness, no unusual recent stress  Eyes:   no blurry vision, no floaters, no recent vision changes, + wears glasses or contacts  ENT:   no hearing loss, no loose or painful teeth, no dentures, last saw dentist after a routine 6 month followup  Hematologic:  no easy bruising, minor abnormal bleeding, no clotting disorder, no frequent epistaxis  Endocrine:  has diabetes, + checks CBG's at home     Physical Exam:   BP 133/64  Pulse 76  Resp 16  Ht 5\' 5"  (1.651 m)  Wt 135 lb (61.236 kg)  BMI 22.47 kg/m2  SpO2 98%  General:    well-appearing  HEENT:  Unremarkable   Neck:   no JVD, no bruits, no adenopathy   Chest:   clear to auscultation, symmetrical breath sounds, no wheezes, no rhonchi   CV:   RRR, grade III/VI harsh systolic murmur both at RSB and apex  Abdomen:  soft, non-tender, no masses   Extremities:  warm, well-perfused, pulses diminished, no LE edema, + varicoscities  Rectal/GU  Deferred  Neuro:   Grossly non-focal and symmetrical throughout  Skin:   Clean and dry, no rashes, no  breakdown   Diagnostic Tests:  Transthoracic Echocardiography  Patient:    Kody, Vigil MR #:       [id number] Study Date: 05/09/2013  Gender:     F Age:        82 Height: Weight: BSA: Pt. Status: Room:    PERFORMING   , [facility]  SONOGRAPHER  [place]  [place], Robin cc:  ------------------------------------------------------------ LV EF: 30%  ------------------------------------------------------------ Study Conclusions  - Left ventricle: The cavity size was normal. Wall thickness   was increased in a pattern of mild LVH. False chord in LV.   There was septal-lateral dyssynchrony. The estimated   ejection fraction was 30%. Global hypokinesis worst in the   inferior and septal walls. Indeterminant diastolic   function. - Aortic valve: Trileaflet; moderately calcified leaflets.   Mean gradient: 33mm Hg (S). Peak gradient:31mm Hg (S).   Valve area: 0.54cm^2(VTI). - Mitral valve: Moderately calcified annulus. Moderate   regurgitation. - Left atrium: The atrium was moderately dilated. - Right ventricle: The cavity size was normal. Systolic   function was normal. - Tricuspid valve: Peak RV-RA gradient: 45mm Hg (S). - Pulmonary arteries: PA peak pressure: 55mm Hg (S). - Systemic veins: IVC measured 2.4 cm with some   respirophasic variation, suggesting RA pressure 10 mmHg . - Pericardium, extracardiac: A trivial pericardial effusion   was identified. Impressions:  - Normal LV size with mild LV hypertrophy. EF 30% with   septal-lateral dyssynchrony and global hypokinesis, worse   in the septal and inferior walls. Normal RV size and   systolic function. Moderate MR. At least moderate, cannot   rule out low gradient severe aortic stenosis. Moderate   pulmonary hypertension. Moderate MR. Transthoracic echocardiography.  M-mode, complete 2D, spectral Doppler, and color Doppler.  Patient  status: Inpatient.  ------------------------------------------------------------  ------------------------------------------------------------ Left ventricle:  The cavity size was normal. Wall thickness was increased in a pattern of mild LVH. False chord in LV. There was septal-lateral dyssynchrony. The estimated ejection fraction was 30%. Global hypokinesis worst in the inferior and septal walls. Indeterminant diastolic function.   ------------------------------------------------------------ Aortic valve:   Trileaflet; moderately calcified leaflets. Doppler:     Valve area: 0.54cm^2(VTI). Peak velocity ratio of LVOT to aortic valve: 0.24. Valve area: 0.68cm^2 (Vmax).   Mean gradient: 33mm Hg (S). Peak gradient:94mm Hg (S).  ------------------------------------------------------------ Aorta:  Aortic root: The aortic root was normal in size. Ascending aorta: The ascending aorta was normal in size.  ------------------------------------------------------------ Mitral valve:   Moderately calcified annulus.  Doppler: There was no evidence for stenosis.    Moderate regurgitation.  ------------------------------------------------------------ Left atrium:  The atrium was moderately dilated.  ------------------------------------------------------------ Right ventricle:  The cavity size was normal. Systolic function was normal.  ------------------------------------------------------------ Pulmonic valve:    Structurally normal valve.   Cusp separation was normal.  Doppler:  Transvalvular velocity was within the normal range.  Trivial regurgitation.  ------------------------------------------------------------ Tricuspid valve:   Doppler:   Trivial regurgitation.  ------------------------------------------------------------ Right atrium:  The atrium was normal in size.  ------------------------------------------------------------ Pericardium:  A trivial pericardial effusion was  identified.   ------------------------------------------------------------ Systemic veins:  IVC measured 2.4 cm with some respirophasic variation, suggesting RA pressure 10 mmHg .  ------------------------------------------------------------  2D measurements        Normal  Doppler measurements   Normal Left ventricle                 Main pulmonary LVID ED,     58.9 mm   43-52   artery chord, PLAX  Pressure, S    55 mm   =30 LVID ES,     52.6 mm   23-38                     Hg chord, PLAX                    LVOT FS, chord,     11 %    >29     Peak vel, S  88.4 cm/s ------ PLAX                           Aortic valve LVPW, ED     12.2 mm   ------  Peak vel, S   371 cm/s ------ IVS/LVPW     1.03      <1.3    Mean vel, S   266 cm/s ------ ratio, ED                      VTI, S       93.6 cm   ------ Vol ED, MOD1  258 ml   ------  Mean           32 mm   ------ Vol ES, MOD1  182 ml   ------  gradient, S       Hg EF, MOD1     29.5 %    ------  Peak           55 mm   ------ Ventricular septum             gradient, S       Hg IVS, ED      12.6 mm   ------  Area, VTI    0.54 cm^2 ------ LVOT                           Peak vel     0.24      ------ Diam, S        19 mm   ------  ratio, Area         2.84 cm^2 ------  LVOT/AV Aorta                          Area, Vmax   0.68 cm^2 ------ Root diam,     26 mm   ------  Mitral valve ED                             Regurg alias 58.1 cm/s ------ Left atrium                    vel, PISA AP dim         48 mm   ------  Max regurg    607 cm/s ------                                vel                                Regurg VTI    202 cm   ------  ERO, PISA    0.29 cm^2 ------                                Regurg vol,    59 ml   ------                                PISA                                Tricuspid valve                                Regurg peak   337 cm/s ------                                 vel                                Peak RV-RA     45 mm   ------                                gradient, S       Hg                                Max regurg    337 cm/s ------                                vel   ------------------------------------------------------------ Prepared and Electronically Authenticated by  [name] 2014-07-21T15:42:[id number]      Stress Echocardiography  Patient:    Marjorie, Deprey MR #:       [id number] Study Date: 05/11/2013 Gender:     F Age:        82 Height:     165.1cm Weight:     55.5kg BSA:        1.57m^2 Pt. Status: Room:       4E02C    PERFORMING   [place], [facility]  ADMITTING    Nona Dell  ATTENDING    [name]  SONOGRAPHER  [facility], RDCS  Henreitta Cea cc:  ------------------------------------------------------------  ------------------------------------------------------------ Indications:      Aortic stenosis 424.1.  ------------------------------------------------------------ History:   PMH:   Congestive heart failure.  Stroke.  Risk factors:  Hypertension. Diabetes mellitus. Dyslipidemia.  ------------------------------------------------------------ Study Conclusions  - Procedure narrative: At rest, AVA 0.6 cm2 Max PG 55 mmHg   Mean PG 31 mmHg   At 10 Mcg/Kg/min, AVA 0.64 cm2 Max PG 54 mmHg Mean PG 29   mmHg   At 15 Mcg/Kg/min, AVA 0.61 cm2 Max PG 63 mmHg Mean PG 42   mmHg   At recovery, AVA 0.8 cm2, Max PG 51 mmHg Mean PG 27 mmHg - Left ventricle: EF 25-30% with septal-lateral   dyssynchrony. Mild LV hypertrophy. - Aortic valve: Valve area: 0.67cm^2(VTI). Valve area:   0.61cm^2 (Vmax). Impressions:  - With low dose dobutamine  stress, AVA is unchanged and <   1.0 cm^2. Mean gradient increases to 42 mmHg. This   suggests low gradient severe aortic stenosis. Dobutamine. Stress echocardiography.  2D.  Height:  Height: 165.1cm. Height: 65in.  Weight:  Weight: 55.5kg.  Weight: 122lb.  Body mass index:  BMI: 20.3kg/m^2.  Body surface area:    BSA: 1.8m^2.  Blood pressure:     122/49.  Patient status:  Inpatient.  ------------------------------------------------------------  ------------------------------------------------------------ Left ventricle:  EF 25-30% with septal-lateral dyssynchrony. Mild LV hypertrophy.  ------------------------------------------------------------ Aortic valve:   Doppler:     VTI ratio of LVOT to aortic valve: 0.24. Valve area: 0.67cm^2(VTI). Indexed valve area: 0.42cm^2/m^2 (VTI). Peak velocity ratio of LVOT to aortic valve: 0.21. Valve area: 0.61cm^2 (Vmax). Indexed valve area: 0.38cm^2/m^2 (Vmax).    Mean gradient: 33mm Hg (S). Peak gradient: 57mm Hg (S).   ------------------------------------------------------------ Stress protocol:  +-----------------------+---+------------+--------+ Stage                  HR BP (mmHg)   Symptoms +-----------------------+---+------------+--------+ Baseline               75 135/79 (98) None     +-----------------------+---+------------+--------+ Dobutamine 5 ug/kg/min 79 169/80 (110)-------- +-----------------------+---+------------+--------+ Dobutamine 10 ug/kg/min96 179/84 (116)-------- +-----------------------+---+------------+--------+ Dobutamine 15 ug/kg/min116188/85 (119)-------- +-----------------------+---+------------+--------+ Immediate post stress  117-------------------- +-----------------------+---+------------+--------+ Recovery; 1 min        96 196/87 (123)-------- +-----------------------+---+------------+--------+  ------------------------------------------------------------ Stress results:   Maximal heart rate during stress was 117bpm (86% of maximal predicted heart rate). The maximal predicted heart rate was 140bpm.The target heart rate was achieved. The heart rate response to stress was normal. There was a normal  resting blood pressure. Abnormal blood pressure response to dobutamine. The rate-pressure product for the peak heart rate and blood pressure was Hg/min.  ------------------------------------------------------------ Baseline:  Low dose: Peak stress: Recovery:  ------------------------------------------------------------  2D measurements   Normal        Doppler measurements   Norma LVOT                                                   l Diam, S   19 mm   ------        LVOT Area    2.84 cm^2 ------        Peak vel, 80. cm/s     -----                                 S           5                                 VTI, S    18. cm       -----                                             3                                 Aortic valve  Peak vel, 376 cm/s     -----                                 S                                 Mean vel, 265 cm/s     -----                                 S                                 VTI, S    77. cm       -----                                             7                                 Mean       33 mm Hg    -----                                 gradient,                                 S                                 Peak       57 mm Hg    -----                                 gradient,                                 S                                 VTI ratio 0.2          -----                                 LVOT/AV     4                                 Area, VTI 0.6 cm^2     -----                                             7  Area      0.4 cm^2/m^2 -----                                 index       2                                 (VTI)                                 Peak vel  0.2          -----                                 ratio,      1                                 LVOT/AV                                 Area,     0.6 cm^2     -----                                  Vmax        1                                 Area      0.3 cm^2/m^2 -----                                 index       8                                 (Vmax)   ------------------------------------------------------------ Prepared and Electronically Authenticated by  [name] 2014-07-23T13:21:[id number]      Cardiac Cath Procedure Note  Indication: Aortic stenosis and heart failure  Procedures performed:  1) Right heart cathererization 2) Selective coronary angiography 3) Left heart catheterization   Description of procedure:      The risks and indication of the procedure were explained. Consent was signed and placed on the chart. An appropriate timeout was taken prior to the procedure. The right groin was prepped and draped in the routine sterile fashion and anesthetized with 1% local lidocaine.   A 6 FR arterial sheath was placed in the right femoral artery using a modified Seldinger technique. Standard catheters including a JL4, JR4 were used. All catheter exchanges were made over a wire. A 7 FR venous sheath was placed in the right femoral vein using a modified Seldinger technique. A standard Swan-Ganz catheter was used for the procedure. The aortic valve was crossed very easily using a 5FR JR4 and long straight wire. Simultaneous LV and FA pressures were obtained.  Complications:  None apparent  Findings:  RA = 8 RV = 58/2/11 PA =  54/22 (37)  PCW = 20 Fick cardiac output/index = 4.57/2.67 PVR = 3.7 Woods FA sat = 95% PA sat = 58%, 61%  Ao Pressure: 119/55 (82)   LV Pressure:  140/10/22  AV gradient: Max 21 mean 12 mmHG   AVA: 1.62cm2   Left main: Large normal  LAD:  Gave off 2 diagonals. 2nd diagonal very large. (larged than LAD). Mid to distal LAD small with mild 30% plaquing  LCX: small. Angiographically normal  RCA: Dominant. Very large. Minimal plaque in the midsection.    Assessment: 1. Minimal non-obstructive CAD 2. CHF with mildly  elevated filling pressures and preserved cardiac output. EF ~ 30% by echo 3. Mild pulmonary HTN 4. Mild aortic stenosis  Plan/Discussion:  Aortic stenosis is mild. CHF relatively well compensated. Medical treatment for HF including titration of ACE-I and b-blocker as tolerated.   Arvilla Meres, MD 1:43 PM     STS Risk Calculator  Procedure    AVR  Risk of Mortality   6.9% Morbidity or Mortality  28.6% Prolonged LOS   14.7% Short LOS    13.4% Permanent Stroke   4.0% Prolonged Vent Support  20.2% DSW Infection    0.3% Renal Failure    8.0% Reoperation    11.3%    Impression:  The patient has severe symptomatic aortic stenosis with chronic combined systolic and diastolic congestive heart failure and nonischemic cardiomyopathy with ejection fraction estimated 30%. Previous echocardiograms have demonstrated transvalvular gradients across the aortic valve in the moderate range, but dobutamine echocardiogram clearly demonstrates the presence of contractile reserve and elevated severe transvalvular gradient with stress.  The patient remains remarkably active and functionally independent.  Although her predicted risks with conventional aortic valve replacement would be at least moderately elevated, I expect that she would probably do reasonably well with surgery.  However, transthoracic echocardiogram also demonstrates the presence of significant mitral regurgitation, and previous left and right heart catheterization performed in 2012 was notable for significant pulmonary hypertension.    I am particularly concerned that the patient's mitral regurgitation may be functionally significant as she recently developed somewhat acute onset of flash pulmonary edema associated with hypertension and syncope and there appears to be significant calcification of the posterior mitral annulus on both her echo and her previous catheterization.   Plan:  The patient and her daughter were counseled at  length regarding treatment alternatives for management of severe symptomatic aortic stenosis. Alternative approaches such as conventional aortic valve replacement, transcatheter aortic valve replacement, and palliative medical therapy were compared and contrasted at length.  The risks associated with conventional surgical aortic valve replacement were been discussed in detail, as were expectations for post-operative convalescence. Long-term prognosis with medical therapy was discussed. This discussion was placed in the context of the patient's own specific clinical presentation and past medical history.  All of their questions been addressed.  We will contact Dr. Shirlee Latch to consider repeat cath and possible transesophageal echocardiogram to further assess the functional anatomy in severity of the patient's underlying mitral regurgitation as well as to more directly estimate the size of her aortic annulus. We will tentatively plan to proceed with surgery in 3 weeks.    Salvatore Decent. Cornelius Moras, MD 05/12/2013 2:10 PM

## 2013-05-19 NOTE — Interval H&P Note (Signed)
History and Physical Interval Note:  05/19/2013 10:14 AM  Joanna [name]  has presented today for surgery, with the diagnosis of AOTRIC STENOSIS   The various methods of treatment have been discussed with the patient and family. After consideration of risks, benefits and other options for treatment, the patient has consented to  Procedure(s): TRANSESOPHAGEAL ECHOCARDIOGRAM (TEE) (N/A) as a surgical intervention .  The patient's history has been reviewed, patient examined, no change in status, stable for surgery.  I have reviewed the patient's chart and labs.  Questions were answered to the patient's satisfaction.     Dalton [employer]

## 2013-05-19 NOTE — Interval H&P Note (Signed)
History and Physical Interval Note:  05/19/2013 11:39 AM  Joanna [name]  has presented today for surgery, with the diagnosis of cp  The various methods of treatment have been discussed with the patient and family. After consideration of risks, benefits and other options for treatment, the patient has consented to  Procedure(s): JV LEFT AND RIGHT HEART CATHETERIZATION WITH CORONARY ANGIOGRAM (N/A) as a surgical intervention .  The patient's history has been reviewed, patient examined, no change in status, stable for surgery.  I have reviewed the patient's chart and labs.  Questions were answered to the patient's satisfaction.     Fey Coghill [employer]

## 2013-05-20 ENCOUNTER — Telehealth (HOSPITAL_COMMUNITY): Payer: Self-pay | Admitting: *Deleted

## 2013-05-20 ENCOUNTER — Encounter (HOSPITAL_COMMUNITY): Payer: Self-pay | Admitting: Cardiology

## 2013-05-20 NOTE — Telephone Encounter (Signed)
Pharmacy [facility] mail service called for clarification about the medication lasix. Pt had an increase in the medication to 3 tablet twice a day, 20 mg tablet.  Authorize pharmacy to dispense 540 tablets with 1 refill, which would be for 6 months, per Tonye Becket, the prescription was sent in wrong, so did an authorization over the phone.

## 2013-05-20 NOTE — Discharge Summary (Signed)
Advanced Heart Failure Team  Discharge Summary   Patient ID: Joanna [name] MRN: [id number], DOB/AGE: 01-28-1930 78 y.o. Admit date: 05/19/2013 D/C date:     05/20/2013   Primary Discharge Diagnoses:  1) Acute Pulmonary edema 2) A/C systolic HF  Secondary Discharge Diagnoses:  1) NICM, EF 30% 2) Carotid Stenosis 3) Aortic Stenosis 4) Hyperlipidemia 5) Hypokalemia 6) DM2   Hospital Course:  Joanna [name] is a 79 year old woman with aortic stenosis, hypertension, diabetes mellitus type II, hyperlipidemia, and peripheral artery disease.  She is followed closely in the HF clinic. She presented to the clinic on 05/09/13 and was doing well until she got up to leave and suddenly developed diaphoresis and severe dyspnea. She did not have any CP. Her O2 sats dropped into the 70's, but quickly returned to 90s with O2. Her SBP was 160's during the episode. ECG: NSR, LBBB. She was transferred to the ED. Chest x-ray showed diffuse bilateral pulmonary edema and she was started on Bipap, was given 20 IV lasix, and started on nitroglycerin gtt. Pertinent labs BNP 4292, K+ 5.3 and Troponin 1.72.   During her stay she was given IV lasix and then transitioned to PO lasix 60 mg BID. She had a net loss of 2.2 liters and reported that she no longer had any SOB.  Her flash pulmonary was felt to be d/t low gradient AS and she had a low dose dobutamine stress ECHO performed during her stay revealing  Low gradient severe AS. She has an appointment with Dr. Cornelius Moras CVTS surgeon tomorrow to evaluate whether she would be a candidate for AVR or TAVR.   Her Cr remained stable with diuresis and today was 0.93 and her K+ was 4.2. We will continue to follow her closely in the HF clinic and instructed patient to call if she had any weight gain or SOB.   Discharge Weight Range: 135-138 lbs Discharge Vitals: There were no vitals taken for this visit.  Labs: Lab Results  Component Value Date   WBC 7.2 05/11/2013   HGB 11.3*  05/11/2013   HCT 34.3* 05/11/2013   MCV 93.0 05/11/2013   PLT 165 05/11/2013     Recent Labs Lab 05/19/13 0925  NA 139  K 4.4  CL 100  CO2 30  BUN 38*  CREATININE 0.95  CALCIUM 11.2*  GLUCOSE 210*   Lab Results  Component Value Date   CHOL 207* 11/15/2012   HDL 66.00 11/15/2012   LDLCALC 75 11/15/2010   TRIG 138.0 11/15/2012   BNP (last 3 results)  Recent Labs  05/09/13 1707  PROBNP 4292.0*    Diagnostic Studies/Procedures   No results found.  Discharge Medications     Medication List    ASK your doctor about these medications       aspirin EC 81 MG tablet  Take 81 mg by mouth daily.     calcium citrate-vitamin D 315-200 MG-UNIT per tablet  Commonly known as:  CITRACAL+D  Take 2 tablets by mouth 2 (two) times daily.     carvedilol 6.25 MG tablet  Commonly known as:  COREG  Take 12.5 mg by mouth 2 (two) times daily with a meal. Morning and afternoon     cholecalciferol 1000 UNITS tablet  Commonly known as:  VITAMIN D  Take 2,000 Units by mouth daily.     docusate sodium 100 MG capsule  Commonly known as:  COLACE  Take 100 mg by mouth 2 (two) times daily.  ferrous sulfate 325 (65 FE) MG tablet  Take 325 mg by mouth 2 (two) times daily with a meal.     fish oil-omega-3 fatty acids 1000 MG capsule  Take 1 g by mouth 2 (two) times daily.     furosemide 20 MG tablet  Commonly known as:  LASIX  Take 3 tablets (60 mg total) by mouth 2 (two) times daily.     Garlic 1000 MG Caps  Take 1 capsule by mouth daily.     losartan 100 MG tablet  Commonly known as:  COZAAR  Take 100 mg by mouth daily.     metFORMIN 1000 MG tablet  Commonly known as:  GLUCOPHAGE  Take 1,000 mg by mouth 2 (two) times daily with a meal.     pioglitazone 45 MG tablet  Commonly known as:  ACTOS  Take 45 mg by mouth daily.     simvastatin 80 MG tablet  Commonly known as:  ZOCOR  Take 80 mg by mouth at bedtime.     triamcinolone cream 0.1 %  Commonly known as:  KENALOG   Apply 1 application topically 2 (two) times daily as needed (for eczema).        Disposition   The patient will be discharged in stable condition to home.  Future Appointments Provider Department Dept Phone   06/02/2013 11:40 AM [facility] [phone]   06/13/2013 11:00 AM [facility] [phone]   06/13/2013 12:00 PM [facility] RESPIRATORY THERAPY [phone]   06/13/2013 1:00 PM [facility] Dennie Bible [facility] SAME DAY SURGERY [phone]   06/13/2013 2:15 PM Purcell Nails, MD [facility] [phone]         Duration of Discharge Encounter: Greater than 35 minutes   Signed, [name]  05/20/2013, 8:00 AM

## 2013-05-23 ENCOUNTER — Other Ambulatory Visit: Payer: Self-pay | Admitting: *Deleted

## 2013-05-23 DIAGNOSIS — I059 Rheumatic mitral valve disease, unspecified: Secondary | ICD-10-CM

## 2013-05-23 DIAGNOSIS — I359 Nonrheumatic aortic valve disorder, unspecified: Secondary | ICD-10-CM

## 2013-06-02 ENCOUNTER — Ambulatory Visit (HOSPITAL_COMMUNITY)
Admit: 2013-06-02 | Discharge: 2013-06-02 | Disposition: A | Discharge status: Home / Self Care | Payer: Medicare Other | Attending: Internal Medicine | Admitting: Internal Medicine

## 2013-06-02 VITALS — BP 144/52 | HR 66 | Wt 143.5 lb

## 2013-06-02 DIAGNOSIS — E119 Type 2 diabetes mellitus without complications: Secondary | ICD-10-CM | POA: Insufficient documentation

## 2013-06-02 DIAGNOSIS — I359 Nonrheumatic aortic valve disorder, unspecified: Secondary | ICD-10-CM | POA: Insufficient documentation

## 2013-06-02 DIAGNOSIS — Z8673 Personal history of transient ischemic attack (TIA), and cerebral infarction without residual deficits: Secondary | ICD-10-CM | POA: Insufficient documentation

## 2013-06-02 DIAGNOSIS — Z7982 Long term (current) use of aspirin: Secondary | ICD-10-CM | POA: Insufficient documentation

## 2013-06-02 DIAGNOSIS — I428 Other cardiomyopathies: Secondary | ICD-10-CM | POA: Insufficient documentation

## 2013-06-02 DIAGNOSIS — I10 Essential (primary) hypertension: Secondary | ICD-10-CM | POA: Insufficient documentation

## 2013-06-02 DIAGNOSIS — Z79899 Other long term (current) drug therapy: Secondary | ICD-10-CM | POA: Insufficient documentation

## 2013-06-02 DIAGNOSIS — M129 Arthropathy, unspecified: Secondary | ICD-10-CM | POA: Insufficient documentation

## 2013-06-02 DIAGNOSIS — E785 Hyperlipidemia, unspecified: Secondary | ICD-10-CM | POA: Insufficient documentation

## 2013-06-02 DIAGNOSIS — I447 Left bundle-branch block, unspecified: Secondary | ICD-10-CM

## 2013-06-02 DIAGNOSIS — I5022 Chronic systolic (congestive) heart failure: Secondary | ICD-10-CM | POA: Insufficient documentation

## 2013-06-02 DIAGNOSIS — I6529 Occlusion and stenosis of unspecified carotid artery: Secondary | ICD-10-CM | POA: Insufficient documentation

## 2013-06-02 DIAGNOSIS — I739 Peripheral vascular disease, unspecified: Secondary | ICD-10-CM | POA: Insufficient documentation

## 2013-06-02 DIAGNOSIS — I509 Heart failure, unspecified: Secondary | ICD-10-CM | POA: Insufficient documentation

## 2013-06-02 DIAGNOSIS — I350 Nonrheumatic aortic (valve) stenosis: Secondary | ICD-10-CM

## 2013-06-02 LAB — BASIC METABOLIC PANEL
CO2: 29 mEq/L (ref 19–32)
Chloride: 100 mEq/L (ref 96–112)
GFR calc Af Amer: 39 mL/min — ABNORMAL LOW (ref >90)
Potassium: 4.3 mEq/L (ref 3.5–5.1)

## 2013-06-02 MED ORDER — POTASSIUM CHLORIDE ER 20 MEQ PO TBCR: 20.0000 meq | EXTENDED_RELEASE_TABLET | ORAL | Status: DC | PRN

## 2013-06-02 NOTE — Patient Instructions (Addendum)
If weight is greater than or equal 137 lbs take 2.5 metolazone (super pill). Whenever you take metolazone take 20 meq potassium.  Good luck in surgery.  Do the following things EVERYDAY: 1) Weigh yourself in the morning before breakfast. Write it down and keep it in a log. 2) Take your medicines as prescribed 3) Eat low salt foods-Limit salt (sodium) to 2000 mg per day.  4) Stay as active as you can everyday 5) Limit all fluids for the day to less than 2 liters  Call any issues.

## 2013-06-03 ENCOUNTER — Telehealth (HOSPITAL_COMMUNITY): Payer: Self-pay | Admitting: Cardiology

## 2013-06-03 ENCOUNTER — Encounter (HOSPITAL_COMMUNITY): Payer: Self-pay | Admitting: Pharmacy Technician

## 2013-06-03 DIAGNOSIS — I5032 Chronic diastolic (congestive) heart failure: Secondary | ICD-10-CM

## 2013-06-03 NOTE — Telephone Encounter (Signed)
Message copied by Noralee Space on Fri Jun 03, 2013  3:29 PM ------      Message from: Dolores Patty      Created: Fri Jun 03, 2013  3:14 PM       Cr up. She was volume overloaded in clinic so would not drop lasix dose yet. Repeat BMET on monday ------

## 2013-06-03 NOTE — Telephone Encounter (Signed)
bmet Monday at [place], order placed

## 2013-06-04 NOTE — Progress Notes (Signed)
Patient ID: Joanna [name], female   DOB: [birth year], 79 y.o.   MRN: [id number] HPI:  Joanna [name] is a 79 year old woman with aortic stenosis, systolic HF due to NICM, hypertension, diabetes mellitus type II, hyperlipidemia, and peripheral artery disease.   Admitted 12/12 with acute systolic HF, EF 30%. Cath with normal coronaries and normal cardiac output (see below). AS was mild to moderate. In hospital also found to be iron-deficiency anemia. Previous colonoscopies normal. Seen by GI who suggested trial of iron and see if it improved. If not, repeat endo. Stool guaiac was negative.   RA = 8 RV = 58/2/11 PA = 54/22 (37) PCW = 20 Fick cardiac output/index = 4.57/2.67 PVR = 3.7 [place]   12/18/11 ECHO EF 30-35% mod AS mean gradient 27 03/26/12 ECHO EF 35% mod AS mean gradient 27  05/09/13 ECHO EF 30%, mild LVH, moderate MR, septal-lateral dyssynchrony, suspect severe AS with AVA 0.6 and peak/mean gradient 63/33 mmHg.   05/19/2013 ECHO EF 35% severe AS, gradient: 32mm Hg (S). Peak gradient: 57mm Hg   Follow up: Last visit developed severe SOB and flash pulmonary edema and was admitted to the hospital. Found to have progressive AS. TEE showed EF 35%, severe AS and moderate MR. She had a consult with CVTS and is going to have aortic valve replacement (+/- MV repair) on 06/09/13. Her lasix was increased to 60 mg BID. Denies SOB/PND/Orthopnea. Weighing daily. Weight at home 135-138 pounds. Compliant with medications. She is back at work 5 hrs a day. Unable to tolerate up titration of carvedilol due to fatigue.   ROS: All systems negative except as listed in HPI, PMH and Problem List.  Past Medical History  Diagnosis Date  . HTN (hypertension)   . Diabetes mellitus   . Peripheral artery disease   . Hyperlipidemia   . Anemia   . PONV (postoperative nausea and vomiting)   . Shortness of breath   . CHF (congestive heart failure)   . Arthritis   . Aortic stenosis 09/21/2011  . AORTIC VALVE SCLEROSIS 02/21/2010   . 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 BUNDLE BRANCH BLOCK, LEFT 07/30/2007  . CAROTID ARTERY STENOSIS 01/05/2009  . CVA 03/14/2010  . DM 07/30/2007  . Edema 04/07/2008  . Herpes zoster without mention of complication 09/29/2007  . 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 HYPERLIPIDEMIA 07/30/2007  . HYPERTENSION 07/30/2007  . Iron deficiency anemia 09/21/2011  . PERIPHERAL VASCULAR DISEASE 07/30/2007  . POLYPECTOMY, HX OF 07/30/2007  . PULMONARY HYPERTENSION 03/23/2009  . SKIN RASH, ALLERGIC 04/07/2008     Current Outpatient Prescriptions  Medication Sig Dispense Refill  . aspirin EC 81 MG tablet Take 81 mg by mouth daily.       . calcium citrate-vitamin D (CITRACAL+D) 315-200 MG-UNIT per tablet Take 2 tablets by mouth 2 (two) times daily.       . carvedilol (COREG) 6.25 MG tablet Take 12.5 mg by mouth 2 (two) times daily with a meal. Morning and afternoon      . cholecalciferol (VITAMIN D) 1000 UNITS tablet Take 2,000 Units by mouth daily.        Marland Kitchen docusate sodium (COLACE) 100 MG capsule Take 100 mg by mouth 2 (two) times daily.        . ferrous sulfate 325 (65 FE) MG tablet Take 325 mg by mouth 2 (two) times daily with a meal.      . fish oil-omega-3 fatty acids 1000 MG capsule Take 1 g by mouth 2 (  two) times daily.       . furosemide (LASIX) 40 MG tablet Take 60 mg by mouth 2 (two) times daily.      . Garlic 1000 MG CAPS Take 1 capsule by mouth daily.       Marland Kitchen losartan (COZAAR) 100 MG tablet Take 100 mg by mouth daily.      . metFORMIN (GLUCOPHAGE) 1000 MG tablet Take 1,000 mg by mouth 2 (two) times daily with a meal.      . pioglitazone (ACTOS) 45 MG tablet Take 45 mg by mouth daily.      . simvastatin (ZOCOR) 80 MG tablet Take 80 mg by mouth at bedtime.      Marland Kitchen METOLAZONE PO Take 0.5 tablets by mouth daily as needed (Take only if weight is >137 pounds.).      Marland Kitchen potassium chloride 20 MEQ TBCR Take 20 mEq by mouth as needed (Take when you take metolazone.).  15 tablet  3   No current  facility-administered medications for this encounter.   PHYSICAL EXAM: Filed Vitals:   06/02/13 1135  BP: 144/52  Pulse: 66  Weight: 143 lb 8 oz (65.091 kg)  SpO2: 99%   General:  Elderly Well appearing. No resp difficulty HEENT: normal Neck: supple. JVP to the jaw; Carotids 2+ bilaterally; no bruits. No lymphadenopathy or thryomegaly appreciated. Cor: PMI normal. Regular rate & rhythm.  3/6 crescendo-decrescendo murmur RUSB. S2 markedly diminished Lungs: bilateral crackles in the bases Abdomen: soft, nontender, nondistended. No hepatosplenomegaly. No bruits or masses. Good bowel sounds. Extremities: no cyanosis, clubbing, rash, 1+ edema TED hose intact Neuro: alert & orientedx3, cranial nerves grossly intact. Moves all 4 extremities w/o difficulty. Affect pleasant  ASSESSMENT & PLAN: 1. CHF: Chronic systolic CHF, NICM with EF 30% and septal-lateral dyssynchrony with severe AS -Overall she is improved but is mildly volume overloaded today -Reinforced need for daily weights and reviewed use of sliding scale diuretics. -Will have her take 2.5 mg metolazone today and every time her weight is greater than or equal to 137 lbs. Discussed when she takes a metolazone to take 20 meq K+. - LBBB with EF < 35%: Patient would be candidate for CRT. Will discuss possible epicardial lead placement with Dr. Cornelius Moras however given her excellent functional capacity I am not sure it will be worth prolonging surgery to have this placed.  -check labs today  2. Carotid stenosis: Carotid dopplers 2/14 with 40-59% RICA stenosis, repeat in 2/15.   3. Hyperlipidemia: Given known vascular disease (carotid stenosis), continue statin.   4. Aortic stenosis: Pending AVR 06/13/13  Truman Hayward 5:04 PM

## 2013-06-06 ENCOUNTER — Ambulatory Visit: Payer: Medicare Other | Admitting: Thoracic Surgery (Cardiothoracic Vascular Surgery)

## 2013-06-06 ENCOUNTER — Other Ambulatory Visit (INDEPENDENT_AMBULATORY_CARE_PROVIDER_SITE_OTHER): Payer: Medicare Other

## 2013-06-06 DIAGNOSIS — I5032 Chronic diastolic (congestive) heart failure: Secondary | ICD-10-CM

## 2013-06-06 LAB — BASIC METABOLIC PANEL
BUN: 50 mg/dL — ABNORMAL HIGH (ref 6–23)
Chloride: 92 mEq/L — ABNORMAL LOW (ref 96–112)
GFR: 19.47 mL/min — ABNORMAL LOW (ref >60.00)
Potassium: 4.9 mEq/L (ref 3.5–5.1)
Sodium: 134 mEq/L — ABNORMAL LOW (ref 135–145)

## 2013-06-07 ENCOUNTER — Telehealth (HOSPITAL_COMMUNITY): Payer: Self-pay | Admitting: *Deleted

## 2013-06-07 DIAGNOSIS — I5022 Chronic systolic (congestive) heart failure: Secondary | ICD-10-CM

## 2013-06-07 NOTE — Telephone Encounter (Signed)
Message copied by Noralee Space on Tue Jun 07, 2013  3:06 PM ------      Message from: Dolores Patty      Created: Tue Jun 07, 2013 12:00 AM       Madden Garron - Cr continues to climb. Hold lasix, metolazone, metformin, kcl and losartan. Recheck in 2 days. If surgery is this week needs to come in tomorrow for hydration. ------

## 2013-06-10 ENCOUNTER — Other Ambulatory Visit (INDEPENDENT_AMBULATORY_CARE_PROVIDER_SITE_OTHER): Payer: Medicare Other

## 2013-06-10 ENCOUNTER — Telehealth (HOSPITAL_COMMUNITY): Payer: Self-pay | Admitting: Anesthesiology

## 2013-06-10 DIAGNOSIS — I5022 Chronic systolic (congestive) heart failure: Secondary | ICD-10-CM

## 2013-06-10 LAB — BASIC METABOLIC PANEL
CO2: 29 mEq/L (ref 19–32)
Calcium: 10.1 mg/dL (ref 8.4–10.5)
Chloride: 99 mEq/L (ref 96–112)
Glucose, Bld: 135 mg/dL — ABNORMAL HIGH (ref 70–99)
Sodium: 137 mEq/L (ref 135–145)

## 2013-06-10 NOTE — Telephone Encounter (Signed)
Labs back today and Cr. 1.2 and K+ 4.6. Talked to patient and instructed her to start taking her lasix again (60 mg BID) and potassium 20 meq daily. Also told to restart her metofrmin. Patient confirmed.

## 2013-06-13 ENCOUNTER — Encounter (HOSPITAL_COMMUNITY): Payer: Self-pay

## 2013-06-13 ENCOUNTER — Ambulatory Visit (INDEPENDENT_AMBULATORY_CARE_PROVIDER_SITE_OTHER): Payer: Medicare Other | Admitting: Thoracic Surgery (Cardiothoracic Vascular Surgery)

## 2013-06-13 ENCOUNTER — Ambulatory Visit (HOSPITAL_COMMUNITY)
Admission: RE | Admit: 2013-06-13 | Discharge: 2013-06-13 | Disposition: A | Discharge status: Home / Self Care | Payer: Medicare Other | Source: Ambulatory Visit | Attending: Thoracic Surgery (Cardiothoracic Vascular Surgery) | Admitting: Thoracic Surgery (Cardiothoracic Vascular Surgery)

## 2013-06-13 ENCOUNTER — Encounter (HOSPITAL_COMMUNITY)
Admission: RE | Admit: 2013-06-13 | Discharge: 2013-06-13 | Disposition: A | Discharge status: Home / Self Care | Payer: Medicare Other | Source: Ambulatory Visit | Attending: Thoracic Surgery (Cardiothoracic Vascular Surgery) | Admitting: Thoracic Surgery (Cardiothoracic Vascular Surgery)

## 2013-06-13 ENCOUNTER — Encounter: Payer: Self-pay | Admitting: Thoracic Surgery (Cardiothoracic Vascular Surgery)

## 2013-06-13 VITALS — BP 152/47 | HR 58 | Temp 98.7°F | Resp 18 | Ht 64.0 in | Wt 135.5 lb

## 2013-06-13 VITALS — BP 152/50 | HR 65 | Resp 16 | Ht 65.0 in | Wt 133.0 lb

## 2013-06-13 DIAGNOSIS — I359 Nonrheumatic aortic valve disorder, unspecified: Secondary | ICD-10-CM

## 2013-06-13 DIAGNOSIS — Z01811 Encounter for preprocedural respiratory examination: Secondary | ICD-10-CM | POA: Insufficient documentation

## 2013-06-13 DIAGNOSIS — I350 Nonrheumatic aortic (valve) stenosis: Secondary | ICD-10-CM | POA: Insufficient documentation

## 2013-06-13 DIAGNOSIS — I080 Rheumatic disorders of both mitral and aortic valves: Secondary | ICD-10-CM | POA: Insufficient documentation

## 2013-06-13 DIAGNOSIS — I059 Rheumatic mitral valve disease, unspecified: Secondary | ICD-10-CM

## 2013-06-13 DIAGNOSIS — Z01812 Encounter for preprocedural laboratory examination: Secondary | ICD-10-CM | POA: Insufficient documentation

## 2013-06-13 DIAGNOSIS — I340 Nonrheumatic mitral (valve) insufficiency: Secondary | ICD-10-CM | POA: Insufficient documentation

## 2013-06-13 DIAGNOSIS — I5022 Chronic systolic (congestive) heart failure: Secondary | ICD-10-CM

## 2013-06-13 DIAGNOSIS — Z01818 Encounter for other preprocedural examination: Secondary | ICD-10-CM | POA: Insufficient documentation

## 2013-06-13 DIAGNOSIS — Z01810 Encounter for preprocedural cardiovascular examination: Secondary | ICD-10-CM | POA: Insufficient documentation

## 2013-06-13 HISTORY — DX: Personal history of other infectious and parasitic diseases: Z86.19

## 2013-06-13 LAB — COMPREHENSIVE METABOLIC PANEL
AST: 18 U/L (ref 0–37)
BUN: 36 mg/dL — ABNORMAL HIGH (ref 6–23)
CO2: 30 mEq/L (ref 19–32)
Calcium: 10.2 mg/dL (ref 8.4–10.5)
Chloride: 98 mEq/L (ref 96–112)
Creatinine, Ser: 1.28 mg/dL — ABNORMAL HIGH (ref 0.50–1.10)
GFR calc Af Amer: 45 mL/min — ABNORMAL LOW (ref >90)
GFR calc non Af Amer: 38 mL/min — ABNORMAL LOW (ref >90)
Glucose, Bld: 123 mg/dL — ABNORMAL HIGH (ref 70–99)
Total Bilirubin: 0.3 mg/dL (ref 0.3–1.2)

## 2013-06-13 LAB — CBC
HCT: 29.1 % — ABNORMAL LOW (ref 36.0–46.0)
Hemoglobin: 9.8 g/dL — ABNORMAL LOW (ref 12.0–15.0)
MCH: 31.1 pg (ref 26.0–34.0)
MCV: 92.4 fL (ref 78.0–100.0)
Platelets: 171 10*3/uL (ref 150–400)
RBC: 3.15 MIL/uL — ABNORMAL LOW (ref 3.87–5.11)
WBC: 5.6 10*3/uL (ref 4.0–10.5)

## 2013-06-13 LAB — HEMOGLOBIN A1C
Hgb A1c MFr Bld: 7.6 % — ABNORMAL HIGH (ref <5.7)
Mean Plasma Glucose: 171 mg/dL — ABNORMAL HIGH (ref <117)

## 2013-06-13 LAB — PULMONARY FUNCTION TEST

## 2013-06-13 LAB — URINALYSIS, ROUTINE W REFLEX MICROSCOPIC
Bilirubin Urine: NEGATIVE
Hgb urine dipstick: NEGATIVE
Ketones, ur: NEGATIVE mg/dL
Nitrite: NEGATIVE
Specific Gravity, Urine: 1.01 (ref 1.005–1.030)
pH: 7.5 (ref 5.0–8.0)

## 2013-06-13 LAB — PROTIME-INR: Prothrombin Time: 14.4 seconds (ref 11.6–15.2)

## 2013-06-13 LAB — SURGICAL PCR SCREEN: MRSA, PCR: NEGATIVE

## 2013-06-13 MED ORDER — ALBUTEROL SULFATE (5 MG/ML) 0.5% IN NEBU: 2.5000 mg | INHALATION_SOLUTION | Freq: Once | RESPIRATORY_TRACT | Status: DC

## 2013-06-13 MED ORDER — CHLORHEXIDINE GLUCONATE 4 % EX LIQD: 30.0000 mL | CUTANEOUS | Status: DC

## 2013-06-13 NOTE — H&P (Signed)
[address]       Joanna [name] [address]             [phone]          CARDIOTHORACIC SURGERY HISTORY AND PHYSICAL EXAM  Referring Provider is Laurey Morale, MD PCP is Illene Regulus, MD    Chief Complaint   Patient presents with   .  Aortic Stenosis       Severe...eval for AVR VS TAVR     HPI:  Patient is an 79 year old widowed white female from [facility] with aortic stenosis, hypertension, type 2 diabetes mellitus, hyperlipidemia, and peripheral arterial disease. The patient states that she has been told that she had a heart murmur all of her life. She has remained remarkably active and otherwise healthy, and she continues to work part-time and remained completely functionally independent. She first presented with acute systolic congestive heart failure with syncope in November of 2012. At that time echocardiogram revealed moderate aortic stenosis with left ventricular ejection fraction 30%. Left and right heart catheterization was performed demonstrating normal coronary artery anatomy.  There was moderate pulmonary hypertension with normal cardiac output. Mean gradient across the aortic valve at catheterization was measured 12 mm mercury. The patient was also noted to have iron deficient anemia at the time. Stool guaiac was reportedly negative.  The patient was treated medically and has been followed ever since in the heart failure clinic.  Repeat transthoracic echocardiogram performed last month demonstrated left ventricular ejection fraction 30% with moderate to severe aortic stenosis. Peak velocity across the aortic valve measured 3.7 m/s corresponding to peak and mean transvalvular gradients of 63 and 33 mm mercury respectively. There was moderate mitral regurgitation with moderate pulmonary hypertension, and septal lateral dyssynchrony. At that time the patient reported stable class II symptoms of mild exertional shortness of breath. However,  while she was in the clinic she developed sudden onset of severe diaphoresis and dyspnea and briefly passed out. She did not have any chest pain. Oxygen saturations transiently dropped into the 70s wall systolic blood pressure was elevated to greater than 160.  Electrocardiogram revealed sinus rhythm with left bundle branch block.  She was sent directly to emergency room and subsequently hospitalized.  Chest x-ray showed diffuse pulmonary edema and pro BNP levels were elevated, as were troponin. She was treated with intravenous Lasix and rapidly improved.  Dobutamine stress echocardiogram was performed and confirmed that the patient's gradient across the aortic valve increased substantially with stress, confirming the presence of severe aortic stenosis.  The patient was discharged from the hospital with instructions to followup in our office to consider possible surgical intervention.  The patient was originally seen in consultation on 05/12/2013. Since then she underwent transesophageal echocardiogram and left heart catheterization by Dr. Shirlee Latch on 05/19/2013.  Transesophageal echocardiogram confirmed the presence of likely low-gradient severe aortic stenosis and also documented the presence of at least moderate mitral regurgitation. There was severe calcification of the posterior mitral annulus with significant restriction of the posterior leaflet. Left heart catheterization was notable for the absence of significant coronary artery disease and also confirmed the presence of severe mitral annular calcification. Right heart catheterization was attempted but could not be performed.  Since then the patient has been seen in followup in the heart failure clinic by Dr. Gala Romney.  He noted that the patient has chronic left bundle branch block with ejection fraction less than 35% and significant septal-lateral dyssynchrony.  As  a result, he suggested that she might benefit from CRT in the future.    The patient  reports that prior to last month she had been doing quite well at home. She continues to work part-time, drives an automobile, and remained functionally independent. She walks nearly every day, although recently in hot weather she has been cutting back. She reports only mild exertional shortness of breath. She denies any history of chest pain or chest tightness either with activity or at rest. She has not had dizzy spells other than the presyncopal episode which occurred this past Monday at the heart failure clinic. She denies PND, orthopnea, palpitations, or lower extremity edema.   Past Medical History  Diagnosis Date  . Peripheral artery disease   . Hyperlipidemia     takes Simvastatin daily  . PONV (postoperative nausea and vomiting)   . Shortness of breath   . Arthritis   . Aortic stenosis 09/21/2011  . AORTIC VALVE SCLEROSIS 02/21/2010  . 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 BUNDLE BRANCH BLOCK, LEFT 07/30/2007  . CAROTID ARTERY STENOSIS 01/05/2009  . CVA 03/14/2010  . 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 PERIPHERAL VASCULAR DISEASE 07/30/2007  . POLYPECTOMY, HX OF 07/30/2007  . Constipation     takes stool softener daily  . CHF (congestive heart failure)     takes lasix daily  . Diabetes mellitus     takes metformin daily and actos   . DM 07/30/2007  . HTN (hypertension)     takes Carvedilol daily and losartan  . HYPERTENSION 07/30/2007  . Weakness     both hands  . Dizziness   . Joint pain   . Itching     on legs d/t dry skin  . Anemia     takes Iron pill daily  . Iron deficiency anemia 09/21/2011  . Nocturia   . History of blood transfusion     no abnormal reaction noted  . History of shingles   . Mitral regurgitation     Past Surgical History  Procedure Laterality Date  . Carotid endarterectomy Left 2011  . Polypectomy  12/27/04  . Cataract extraction Bilateral 2009  . Tee without cardioversion N/A 05/19/2013    Procedure: TRANSESOPHAGEAL ECHOCARDIOGRAM (TEE);  Surgeon:  Laurey Morale, MD;  Location: [facility] ENDOSCOPY;  Service: Cardiovascular;  Laterality: N/A;  . Colonoscopy    . Cardiac catheterization  2012    Family History  Problem Relation Age of Onset  . Colon cancer Mother   . Diabetes Neg Hx   . Coronary artery disease Neg Hx     Social History History  Substance Use Topics  . Smoking status: Never Smoker   . Smokeless tobacco: Never Used  . Alcohol Use: No    Prior to Admission medications   Medication Sig Start Date End Date Taking? Authorizing Provider  aspirin EC 81 MG tablet Take 81 mg by mouth daily.    Yes Historical Provider, MD  calcium citrate-vitamin D (CITRACAL+D) 315-200 MG-UNIT per tablet Take 2 tablets by mouth 2 (two) times daily.    Yes Historical Provider, MD  carvedilol (COREG) 6.25 MG tablet Take 12.5 mg by mouth 2 (two) times daily with a meal. Morning and afternoon 11/15/12  Yes Jacques Navy, MD  cholecalciferol (VITAMIN D) 1000 UNITS tablet Take 2,000 Units by mouth daily.     Yes Historical Provider, MD  docusate sodium (COLACE) 100 MG capsule Take 100 mg by mouth 2 (two) times daily.     Yes  Historical Provider, MD  ferrous sulfate 325 (65 FE) MG tablet Take 325 mg by mouth 2 (two) times daily with a meal. 10/13/11  Yes Jacques Navy, MD  fish oil-omega-3 fatty acids 1000 MG capsule Take 1 g by mouth 2 (two) times daily.    Yes Historical Provider, MD  furosemide (LASIX) 40 MG tablet Take 60 mg by mouth 2 (two) times daily.   Yes Historical Provider, MD  Garlic 1000 MG CAPS Take 1 capsule by mouth daily.    Yes Historical Provider, MD  losartan (COZAAR) 100 MG tablet Take 100 mg by mouth daily.   Yes Historical Provider, MD  metFORMIN (GLUCOPHAGE) 1000 MG tablet Take 1,000 mg by mouth 2 (two) times daily with a meal.   Yes Historical Provider, MD  METOLAZONE PO Take 0.5 tablets by mouth daily as needed (Take only if weight is >137 pounds.).   Yes Historical Provider, MD  pioglitazone (ACTOS) 45 MG tablet Take 45  mg by mouth daily.   Yes Historical Provider, MD  potassium chloride 20 MEQ TBCR Take 20 mEq by mouth as needed (Take when you take metolazone.). 06/02/13  Yes Dolores Patty, MD  simvastatin (ZOCOR) 80 MG tablet Take 80 mg by mouth at bedtime.   Yes Historical Provider, MD    Allergies  Allergen Reactions  . Other Hives    STEROIDS  . Prednisone Hives and Other (See Comments)    She is allergic to all steroids!    Review of Systems:              General:                      Normal appetite, normal energy, sudden 6 pound weight gain within a period of 4 days prior to recent admission, no weight loss, no fever             Cardiac:                      no chest pain with exertion, no chest pain at rest, + SOB with moderate exertion, no resting SOB other than that which occurred early this week, - PND, no orthopnea, no palpitations, no arrhythmia, no atrial fibrillation, no LE edema, has had severe dizzy spells with heavy breathing and has actually passed out the most recent being 05/09/2013, + syncope             Respiratory:                Has occasional shortness of breath but copes by sitting down, no home oxygen, no productive cough, no dry cough, no bronchitis, no wheezing, no hemoptysis, no asthma, no pain with inspiration or cough, no sleep apnea, no CPAP at night             GI:                                no difficulty swallowing, no reflux, no frequent heartburn, no hiatal hernia, no abdominal pain, has mild constipation resultant of iron supplements, no diarrhea, no hematochezia, no hematemesis, no melena             GU:                              no dysuria,  no  frequency, no urinary tract infection, no hematuria, no kidney stones, no kidney disease             Vascular:                     no pain suggestive of claudication, no pain in feet, has leg cramps, has varicose veins, no DVT, no non-healing foot ulcer             Neuro:                         no stroke, no TIA's, no  seizures, no headaches, no temporary blindness one eye,  no slurred speech, no peripheral neuropathy, no chronic pain, no instability of gait, some occasional mild memory/cognitive dysfunction             Musculoskeletal:         has arthritis, has joint swelling, no myalgias, very little difficulty walking only occasional and patient tries walking daily, normal mobility               Skin:                            no rash, has minor itching, no skin infections, ono pressure sores or ulcerations             Psych:                         no anxiety, no depression, no nervousness, no unusual recent stress             Eyes:                           no blurry vision, no floaters, no recent vision changes, + wears glasses or contacts             ENT:                            no hearing loss, no loose or painful teeth, no dentures, last saw dentist after a routine 6 month followup             Hematologic:               no easy bruising, minor abnormal bleeding, no clotting disorder, no frequent epistaxis             Endocrine:                   has diabetes, + checks CBG's at home                           Physical Exam:              BP 133/64  Pulse 76  Resp 16  Ht 5\' 5"  (1.651 m)  Wt 135 lb (61.236 kg)  BMI 22.47 kg/m2  SpO2 98%             General:                        well-appearing             HEENT:  Unremarkable               Neck:                           no JVD, no bruits, no adenopathy               Chest:                         clear to auscultation, symmetrical breath sounds, no wheezes, no rhonchi               CV:                              RRR, grade III/VI harsh systolic murmur both at RSB and apex             Abdomen:                    soft, non-tender, no masses               Extremities:                 warm, well-perfused, pulses diminished, no LE edema, + varicoscities             Rectal/GU                   Deferred             Neuro:                          Grossly non-focal and symmetrical throughout             Skin:                            Clean and dry, no rashes, no breakdown   Diagnostic Tests:  Transthoracic Echocardiography  Patient:    Joanna [name], Joanna [name] MR #:       [id number] Study Date: 05/09/2013 Gender:     F Age:        82 Height: Weight: BSA: Pt. Status: Room:    PERFORMING   Maryjean Ka  SONOGRAPHER  Shiro  Jamas Lav cc:  ------------------------------------------------------------ LV EF: 30%  ------------------------------------------------------------ Study Conclusions  - Left ventricle: The cavity size was normal. Wall thickness   was increased in a pattern of mild LVH. False chord in LV.   There was septal-lateral dyssynchrony. The estimated   ejection fraction was 30%. Global hypokinesis worst in the   inferior and septal walls. Indeterminant diastolic   function. - Aortic valve: Trileaflet; moderately calcified leaflets.   Mean gradient: 33mm Hg (S). Peak gradient:65mm Hg (S).   Valve area: 0.54cm^2(VTI). - Mitral valve: Moderately calcified annulus. Moderate   regurgitation. - Left atrium: The atrium was moderately dilated. - Right ventricle: The cavity size was normal. Systolic   function was normal. - Tricuspid valve: Peak RV-RA gradient: 45mm Hg (S). - Pulmonary arteries: PA peak pressure: 55mm Hg (S). - Systemic veins: IVC measured 2.4 cm with some   respirophasic variation, suggesting RA pressure 10 mmHg . - Pericardium, extracardiac: A trivial pericardial effusion   was identified. Impressions:  - Normal LV size  with mild LV hypertrophy. EF 30% with   septal-lateral dyssynchrony and global hypokinesis, worse   in the septal and inferior walls. Normal RV size and   systolic function. Moderate MR. At least moderate, cannot   rule out low gradient severe aortic stenosis. Moderate   pulmonary hypertension.  Moderate MR. Transthoracic echocardiography.  M-mode, complete 2D, spectral Doppler, and color Doppler.  Patient status: Inpatient.  ------------------------------------------------------------  ------------------------------------------------------------ Left ventricle:  The cavity size was normal. Wall thickness was increased in a pattern of mild LVH. False chord in LV. There was septal-lateral dyssynchrony. The estimated ejection fraction was 30%. Global hypokinesis worst in the inferior and septal walls. Indeterminant diastolic function.   ------------------------------------------------------------ Aortic valve:   Trileaflet; moderately calcified leaflets. Doppler:     Valve area: 0.54cm^2(VTI). Peak velocity ratio of LVOT to aortic valve: 0.24. Valve area: 0.68cm^2 (Vmax).   Mean gradient: 33mm Hg (S). Peak gradient:4mm Hg (S).  ------------------------------------------------------------ Aorta:  Aortic root: The aortic root was normal in size. Ascending aorta: The ascending aorta was normal in size.  ------------------------------------------------------------ Mitral valve:   Moderately calcified annulus.  Doppler: There was no evidence for stenosis.    Moderate regurgitation.  ------------------------------------------------------------ Left atrium:  The atrium was moderately dilated.  ------------------------------------------------------------ Right ventricle:  The cavity size was normal. Systolic function was normal.  ------------------------------------------------------------ Pulmonic valve:    Structurally normal valve.   Cusp separation was normal.  Doppler:  Transvalvular velocity was within the normal range.  Trivial regurgitation.  ------------------------------------------------------------ Tricuspid valve:   Doppler:   Trivial regurgitation.  ------------------------------------------------------------ Right atrium:  The atrium was normal in  size.  ------------------------------------------------------------ Pericardium:  A trivial pericardial effusion was identified.   ------------------------------------------------------------ Systemic veins:  IVC measured 2.4 cm with some respirophasic variation, suggesting RA pressure 10 mmHg .  ------------------------------------------------------------  2D measurements        Normal  Doppler measurements   Normal Left ventricle                 Main pulmonary LVID ED,     58.9 mm   43-52   artery chord, PLAX                    Pressure, S    55 mm   =30 LVID ES,     52.6 mm   23-38                     Hg chord, PLAX                    LVOT FS, chord,     11 %    >29     Peak vel, S  88.4 cm/s ------ PLAX                           Aortic valve LVPW, ED     12.2 mm   ------  Peak vel, S   371 cm/s ------ IVS/LVPW     1.03      <1.3    Mean vel, S   266 cm/s ------ ratio, ED                      VTI, S       93.6 cm   ------ Vol ED, MOD1  258 ml   ------  Mean  32 mm   ------ Vol ES, MOD1  182 ml   ------  gradient, S       Hg EF, MOD1     29.5 %    ------  Peak           55 mm   ------ Ventricular septum             gradient, S       Hg IVS, ED      12.6 mm   ------  Area, VTI    0.54 cm^2 ------ LVOT                           Peak vel     0.24      ------ Diam, S        19 mm   ------  ratio, Area         2.84 cm^2 ------  LVOT/AV Aorta                          Area, Vmax   0.68 cm^2 ------ Root diam,     26 mm   ------  Mitral valve ED                             Regurg alias 58.1 cm/s ------ Left atrium                    vel, PISA AP dim         48 mm   ------  Max regurg    607 cm/s ------                                vel                                Regurg VTI    202 cm   ------                                ERO, PISA    0.29 cm^2 ------                                Regurg vol,    59 ml   ------                                PISA                                 Tricuspid valve                                Regurg peak   337 cm/s ------                                vel  Peak RV-RA     45 mm   ------                                gradient, S       Hg                                Max regurg    337 cm/s ------                                vel   ------------------------------------------------------------ Prepared and Electronically Authenticated by  [name] 2014-07-21T15:42:[id number]      Stress Echocardiography  Patient:    Joanna [name], Joanna [name] MR #:       [id number] Study Date: 05/11/2013 Gender:     F Age:        82 Height:     165.1cm Weight:     55.5kg BSA:        1.68m^2 Pt. Status: Room:       4E02C    PERFORMING   [place], [facility]  ADMITTING    Nona Dell  ATTENDING    [name]  SONOGRAPHER  [facility], RDCS  Henreitta Cea cc:  ------------------------------------------------------------  ------------------------------------------------------------ Indications:      Aortic stenosis 424.1.  ------------------------------------------------------------ History:   PMH:   Congestive heart failure.  Stroke.  Risk factors:  Hypertension. Diabetes mellitus. Dyslipidemia.  ------------------------------------------------------------ Study Conclusions  - Procedure narrative: At rest, AVA 0.6 cm2 Max PG 55 mmHg   Mean PG 31 mmHg   At 10 Mcg/Kg/min, AVA 0.64 cm2 Max PG 54 mmHg Mean PG 29   mmHg   At 15 Mcg/Kg/min, AVA 0.61 cm2 Max PG 63 mmHg Mean PG 42   mmHg   At recovery, AVA 0.8 cm2, Max PG 51 mmHg Mean PG 27 mmHg - Left ventricle: EF 25-30% with septal-lateral   dyssynchrony. Mild LV hypertrophy. - Aortic valve: Valve area: 0.67cm^2(VTI). Valve area:   0.61cm^2 (Vmax). Impressions:  - With low dose dobutamine stress, AVA is unchanged and <   1.0 cm^2. Mean gradient increases to 42 mmHg. This   suggests low gradient severe aortic  stenosis. Dobutamine. Stress echocardiography.  2D.  Height:  Height: 165.1cm. Height: 65in.  Weight:  Weight: 55.5kg. Weight: 122lb.  Body mass index:  BMI: 20.3kg/m^2.  Body surface area:    BSA: 1.41m^2.  Blood pressure:     122/49.  Patient status:  Inpatient.  ------------------------------------------------------------  ------------------------------------------------------------ Left ventricle:  EF 25-30% with septal-lateral dyssynchrony. Mild LV hypertrophy.  ------------------------------------------------------------ Aortic valve:   Doppler:     VTI ratio of LVOT to aortic valve: 0.24. Valve area: 0.67cm^2(VTI). Indexed valve area: 0.42cm^2/m^2 (VTI). Peak velocity ratio of LVOT to aortic valve: 0.21. Valve area: 0.61cm^2 (Vmax). Indexed valve area: 0.38cm^2/m^2 (Vmax).    Mean gradient: 33mm Hg (S). Peak gradient: 57mm Hg (S).   ------------------------------------------------------------ Stress protocol:  +-----------------------+---+------------+--------+ Stage                  HR BP (mmHg)   Symptoms +-----------------------+---+------------+--------+ Baseline               75 135/79 (98) None     +-----------------------+---+------------+--------+ Dobutamine 5 ug/kg/min 79 169/80 (110)-------- +-----------------------+---+------------+--------+ Dobutamine 10 ug/kg/min96 179/84 (116)-------- +-----------------------+---+------------+--------+  Dobutamine 15 ug/kg/min116188/85 (119)-------- +-----------------------+---+------------+--------+ Immediate post stress  117-------------------- +-----------------------+---+------------+--------+ Recovery; 1 min        96 196/87 (123)-------- +-----------------------+---+------------+--------+  ------------------------------------------------------------ Stress results:   Maximal heart rate during stress was 117bpm (86% of maximal predicted heart rate). The maximal predicted heart rate  was 140bpm.The target heart rate was achieved. The heart rate response to stress was normal. There was a normal resting blood pressure. Abnormal blood pressure response to dobutamine. The rate-pressure product for the peak heart rate and blood pressure was Hg/min.  ------------------------------------------------------------ Baseline:  Low dose: Peak stress: Recovery:  ------------------------------------------------------------  2D measurements   Normal        Doppler measurements   Norma LVOT                                                   l Diam, S   19 mm   ------        LVOT Area    2.84 cm^2 ------        Peak vel, 80. cm/s     -----                                 S           5                                 VTI, S    18. cm       -----                                             3                                 Aortic valve                                 Peak vel, 376 cm/s     -----                                 S                                 Mean vel, 265 cm/s     -----                                 S                                 VTI, S    77. cm       -----  7                                 Mean       33 mm Hg    -----                                 gradient,                                 S                                 Peak       57 mm Hg    -----                                 gradient,                                 S                                 VTI ratio 0.2          -----                                 LVOT/AV     4                                 Area, VTI 0.6 cm^2     -----                                             7                                 Area      0.4 cm^2/m^2 -----                                 index       2                                 (VTI)                                 Peak vel  0.2          -----                                 ratio,      1  LVOT/AV                                 Area,     0.6 cm^2     -----                                 Vmax        1                                 Area      0.3 cm^2/m^2 -----                                 index       8                                 (Vmax)   ------------------------------------------------------------ Prepared and Electronically Authenticated by  [name] 2014-07-23T13:21:[id number]   Transesophageal Echocardiography  Patient:    Joanna [name], Joanna [name] MR #:       [id number] Study Date: 05/19/2013 Gender:     F Age:        82 Height:     157.5cm Weight:     61.4kg BSA:        1.72m^2 Pt. Status: Room:       [facility]    PERFORMING   [place], [facility]  ADMITTING    [name]  ATTENDING    Shirlee Latch, [place]  SONOGRAPHER  Nolon Rod, RDCS  ORDERING     Ulla Potash  Rollene Rotunda cc:  ------------------------------------------------------------ LV EF: 35%  ------------------------------------------------------------ Indications:      Mitral regurgitation 424.0.  ------------------------------------------------------------ Study Conclusions  - Left ventricle: The cavity size was normal. Wall thickness   was increased increased in a pattern of mild to moderate   LVH. Septal and inferior hypokinesis, septal-lateral   dyssynchrony. The estimated ejection fraction was 35%. - Aortic valve: Trileaflet; severely calcified leaflets.   Suspect low gradient severe aortic stenosis. Mean   gradient: 32mm Hg (S). Peak gradient: 57mm Hg (S). - Aorta: Normal caliber with grade III plaque in descending   thoracic aorta. - Mitral valve: There was calcification of the mitral   annulus and calcification with some degree of fixation of   the posterior mitral leaflet. There was moderate mitral   regurgitation. No significant stenosis. - Left atrium: The atrium was mildly dilated. No evidence of   thrombus in the atrial cavity or  appendage. - Pulmonary veins: No pulmonary vein systolic doppler flow   reversal noted. - Right ventricle: The cavity size was normal. Systolic   function was normal. - Right atrium: No evidence of thrombus in the atrial cavity   or appendage. - Atrial septum: No defect or patent foramen ovale was   identified. Echo contrast study showed no right-to-left   atrial level shunt, at baseline or with provocation. Transesophageal echocardiography.  2D and color Doppler. Height:  Height: 157.5cm. Height: 62in.  Weight:  Weight: 61.4kg. Weight: 135lb.  Body mass index:  BMI: 24.7kg/m^2. Body surface area:    BSA: 1.30m^2.  Blood pressure: 145/57.  Patient status:  Outpatient.  Location:  Endoscopy.   ------------------------------------------------------------  ------------------------------------------------------------ Left ventricle:  The cavity size was normal. Wall thickness was increased increased in a pattern of mild to moderate LVH. Septal and inferior hypokinesis, septal-lateral dyssynchrony. The estimated ejection fraction was 35%.  ------------------------------------------------------------ Aortic valve:   Trileaflet; severely calcified leaflets. Doppler:   No regurgitation. Suspect low gradient severe aortic stenosis.    Mean gradient: 32mm Hg (S). Peak gradient: 57mm Hg (S).  ------------------------------------------------------------ Aorta:  Normal caliber with grade III plaque in descending thoracic aorta.  ------------------------------------------------------------ Mitral valve:  There was calcification of the mitral annulus and calcification with some degree of fixation of the posterior mitral leaflet. There was moderate mitral regurgitation. No significant stenosis.  ------------------------------------------------------------ Left atrium:  The atrium was mildly dilated.  No evidence of thrombus in the atrial cavity or  appendage.  ------------------------------------------------------------ Atrial septum:  No defect or patent foramen ovale was identified.  Echo contrast study showed no right-to-left atrial level shunt, at baseline or with provocation.  ------------------------------------------------------------ Pulmonary veins:  No pulmonary vein systolic doppler flow reversal noted.  ------------------------------------------------------------ Right ventricle:  The cavity size was normal. Systolic function was normal.  ------------------------------------------------------------ Pulmonic valve:    Structurally normal valve.   Cusp separation was normal.  Doppler:   Trivial regurgitation.   ------------------------------------------------------------ Tricuspid valve:   Doppler:   Trivial regurgitation.  ------------------------------------------------------------ Right atrium:  The atrium was normal in size.  No evidence of thrombus in the atrial cavity or appendage.  ------------------------------------------------------------ Pericardium:  There was no pericardial effusion.   ------------------------------------------------------------ Post procedure conclusions Ascending Aorta:  - Normal caliber with grade III plaque in descending   thoracic aorta.  ------------------------------------------------------------  2D measurements   Normal       Doppler measurements   Normal LVOT                           Aortic valve Diam, S   20 mm   ------       Peak vel, S   357 cm/s ------ Area    3.14 cm^2 ------       Mean vel, S   265 cm/s ------                                VTI, S       89.5 cm   ------                                Mean           30 mm   ------                                gradient, S       Hg                                Peak           51 mm   ------                                gradient, S       Hg  Mitral valve                                 Regurg alias 29.7 cm/s ------                                vel, PISA                                Max regurg    679 cm/s ------                                vel                                Regurg VTI    263 cm   ------                                ERO, PISA    0.33 cm^2 ------                                Regurg vol,    87 ml   ------                                PISA   ------------------------------------------------------------ Prepared and Electronically Authenticated by  [name] [id number]:11:54.007      Cardiac Cath Procedure Note  Indication: Aortic stenosis and heart failure  Procedures performed:  1) Right heart cathererization 2) Selective coronary angiography 3) Left heart catheterization   Description of procedure:      The risks and indication of the procedure were explained. Consent was signed and placed on the chart. An appropriate timeout was taken prior to the procedure. The right groin was prepped and draped in the routine sterile fashion and anesthetized with 1% local lidocaine.   A 6 FR arterial sheath was placed in the right femoral artery using a modified Seldinger technique. Standard catheters including a JL4, JR4 were used. All catheter exchanges were made over a wire. A 7 FR venous sheath was placed in the right femoral vein using a modified Seldinger technique. A standard Swan-Ganz catheter was used for the procedure. The aortic valve was crossed very easily using a 5FR JR4 and long straight wire. Simultaneous LV and FA pressures were obtained.  Complications:  None apparent  Findings:  RA = 8 RV = 58/2/11 PA =  54/22 (37) PCW = 20 Fick cardiac output/index = 4.57/2.67 PVR = 3.7 Woods FA sat = 95% PA sat = 58%, 61%  Ao Pressure: 119/55 (82)   LV Pressure:  140/10/22  AV gradient: Max 21 mean 12 mmHG   AVA: 1.62cm2   Left main: Large normal  LAD:  Gave off 2 diagonals. 2nd diagonal very large. (larged  than LAD). Mid to distal LAD small with mild 30% plaquing  LCX: small. Angiographically normal  RCA: Dominant. Very large. Minimal plaque in the midsection.    Assessment: 1. Minimal non-obstructive CAD 2. CHF with mildly elevated filling pressures  and preserved cardiac output. EF ~ 30% by echo 3. Mild pulmonary HTN 4. Mild aortic stenosis  Plan/Discussion:  Aortic stenosis is mild. CHF relatively well compensated. Medical treatment for HF including titration of ACE-I and b-blocker as tolerated.   Arvilla Meres, MD 1:43 PM    Cardiac Catheterization Procedure Note  Name: Joanna [name] MRN: [id number] DOB: 1930-03-25  Procedure: Selective Coronary Angiography  Indication: Severe AS, needs surgery.             Procedural details: The right groin was prepped, draped, and anesthetized with 1% lidocaine. Using modified Seldinger technique, a 4 French sheath was introduced into the right femoral artery. Several attempts were made to access the right femoral vein.  The vein was difficult to identify.  Once identified, I was unable to pass a wire into the vein.  I suspect that the patient had quite low venous pressure in the setting of being NPO and being on Lasix.  I opted not to try the left groin.  Standard Judkins catheters were used for coronary angiography. Catheter exchanges were performed over a guidewire. There were no immediate procedural complications. The patient was transferred to the post catheterization recovery area for further monitoring.  Procedural Findings: Hemodynamics:  AO 136/45              Coronary angiography: Coronary dominance: right  Left mainstem: No significant disease.   Left anterior descending (LAD): Luminal irregularities in the LAD.    Left circumflex (LCx): Large ramus with luminal irregularities. Small AV LCx without significant disease.    Right coronary artery (RCA): Luminal irregularities.  There was severe mitral annular  calcification noted.  The aortic valve was calcified.     Left ventriculography: Not done (known severe AS).    Final Conclusions:  No obstructive CAD.  Heavily calcified mitral annulus.  RHC not done: very difficult to access right femoral vein and then unable to pass wire.  Suspect she was dehydrated (NPO and on Lasix).   Opted not to move the left femoral vein.    Recommendations: Will review cath and TEE with Dr. Cornelius Moras.    [name] 05/19/2013, 12:07 PM      STS Risk Calculator  Procedure                                         AVR  Risk of Mortality                                6.9% Morbidity or Mortality                       28.6% Prolonged LOS                                   14.7% Short LOS                                           13.4% Permanent Stroke                            4.0% Prolonged Vent  Support                      20.2% DSW Infection                                     0.3% Renal Failure                                       8.0% Reoperation                                        11.3%     Impression:  The patient has severe symptomatic aortic stenosis with chronic combined systolic and diastolic congestive heart failure and nonischemic cardiomyopathy with ejection fraction estimated 30%. Transthoracic echocardiograms have demonstrated transvalvular gradients across the aortic valve in the moderate range, but dobutamine echocardiogram clearly demonstrates the presence of contractile reserve and elevated transvalvular gradient with stress consistent with severe low-gradient aortic stenosis.  Transesophageal echocardiogram confirmed similar findings and also demonstrates at least moderate mitral regurgitation with significant restriction of the posterior leaflet of the mitral valve because of severe mitral annular calcification. Finally, the patient has chronic left bundle branch block with left ventricular ejection fraction less than 35%. It might be  reasonable to consider CRT in the future depending upon the patient's recovery following surgery, and she will unquestionably be at increased risk for need of permanent pacemaker postoperatively.  The patient remains remarkably active and functionally independent.  Although her predicted risks with conventional aortic valve replacement would be at least moderately elevated, I expect that she would probably do reasonably well with surgery.     Plan:  The patient and her daughter were counseled at length regarding treatment alternatives for management of severe symptomatic aortic stenosis. Alternative approaches such as conventional aortic valve replacement, transcatheter aortic valve replacement, and palliative medical therapy were compared and contrasted at length.  The risks associated with conventional surgical aortic valve replacement were been discussed in detail, as were expectations for post-operative convalescence. Long-term prognosis with medical therapy was discussed. This discussion was placed in the context of the patient's own specific clinical presentation and past medical history.  All of their questions been addressed.  We plan to proceed with aortic valve replacement and possible mitral valve repair or replacement on Wednesday, 06/15/2013. We will plan to replace the patient's aortic valve using a bioprosthetic tissue valve, and if the mitral valve cannot be repaired we will use a bioprosthetic tissue valve for it as well. Finally, we will plan to place a permanent epicardial pacemaker lead on the lateral left ventricular surface to facilitate possible CRT in the future if necessary.  I have again reviewed the indications, risks, and potential benefits of surgery with the patient and her daughter in the office today.  They understand and accept all potential associated risks of surgery including but not limited to risk of death, stroke, myocardial infarction, congestive heart failure,  respiratory failure, renal failure, bleeding requiring blood transfusion and/or reexploration, arrhythmia, heart block or bradycardia requiring permanent pacemaker, pneumonia, pleural effusion, wound infection, pulmonary embolus or other thromboembolic complication, chronic pain or other delayed complications related to valve repair or replacement.  All questions answered.  Salvatore Decent. Cornelius Moras, MD

## 2013-06-13 NOTE — Progress Notes (Signed)
Pre-op Cardiac Surgery  Carotid Findings:  1-39% ICA stenosis.  Vertebral artery flow is antegrade.  Upper Extremity Right Left  Brachial Pressures 135T 155T  Radial Waveforms T T  Ulnar Waveforms T T  Palmar Arch (Allen's Test) WNL WNL   Findings:      Lower  Extremity Right Left  Dorsalis Pedis    Anterior Tibial    Posterior Tibial    Ankle/Brachial Indices      Findings:

## 2013-06-13 NOTE — Pre-Procedure Instructions (Signed)
Joanna [name]  06/13/2013   Your procedure is scheduled on:  Wed, Aug 27 @ 8:30 AM  Report to [facility] at 6:30 AM.  Call this number if you have problems the morning of surgery: [phone]   Remember:   Do not eat food or drink liquids after midnight.   Take these medicines the morning of surgery with A SIP OF WATER: Carvedilol(Coreg)               Stop taking your Aspirin and Fish Oil.No Goody's,BC's,Aleve,Ibuprofen,or any Herbal Medications                  Do not wear jewelry, make-up or nail polish.  Do not wear lotions, powders, or perfumes. You may wear deodorant.  Do not shave 48 hours prior to surgery.   Do not bring valuables to the hospital.  [facility] is not responsible                   for any belongings or valuables.  Contacts, dentures or bridgework may not be worn into surgery.  Leave suitcase in the car. After surgery it may be brought to your room.  For patients admitted to the hospital, checkout time is 11:00 AM the day of  discharge.     Special Instructions: Shower using CHG 2 nights before surgery and the night before surgery.  If you shower the day of surgery use CHG.  Use special wash - you have one bottle of CHG for all showers.  You should use approximately 1/3 of the bottle for each shower.   Please read over the following fact sheets that you were given: Pain Booklet, Coughing and Deep Breathing, Blood Transfusion Information, Open Heart Packet, MRSA Information and Surgical Site Infection Prevention

## 2013-06-13 NOTE — Progress Notes (Signed)
[address]       [name] [address]             [phone]     CARDIOTHORACIC SURGERY OFFICE NOTE  Referring Provider is Laurey Morale, MD PCP is Illene Regulus, MD   HPI:  Patient returns for followup of severe symptomatic aortic stenosis and mitral regurgitation. She was originally seen in consultation on 05/12/2013. Since then she underwent transesophageal echocardiogram and left heart catheterization by Dr. Shirlee Latch on 05/19/2013.  Transesophageal echocardiogram confirmed the presence of likely low-gradient severe aortic stenosis and also documented the presence of at least moderate mitral regurgitation. There was severe calcification of the posterior mitral annulus with significant restriction of the posterior leaflet. Left heart catheterization was notable for the absence of significant coronary artery disease and also confirmed the presence of severe mitral annular calcification. Right heart catheterization was attempted but could not be performed.  Since then the patient has been seen in followup in the heart failure clinic by Dr. Gala Romney.  He noted that the patient has chronic left bundle branch block with ejection fraction less than 35% and significant septal-lateral dyssynchrony.  As a result, he suggested that she might benefit from CRT in the future.    The patient returns to the office today with her daughter to discuss the results of these tests and make final plans for surgery scheduled for later this week. She reports no new problems or complaints. She is eager to proceed with surgery.   Current Outpatient Prescriptions  Medication Sig Dispense Refill  . aspirin EC 81 MG tablet Take 81 mg by mouth daily.       . calcium citrate-vitamin D (CITRACAL+D) 315-200 MG-UNIT per tablet Take 2 tablets by mouth 2 (two) times daily.       . carvedilol (COREG) 6.25 MG tablet Take 12.5 mg by mouth 2 (two) times daily with a meal. Morning and afternoon      .  cholecalciferol (VITAMIN D) 1000 UNITS tablet Take 2,000 Units by mouth daily.        Marland Kitchen docusate sodium (COLACE) 100 MG capsule Take 100 mg by mouth 2 (two) times daily.        . ferrous sulfate 325 (65 FE) MG tablet Take 325 mg by mouth 2 (two) times daily with a meal.      . fish oil-omega-3 fatty acids 1000 MG capsule Take 1 g by mouth 2 (two) times daily.       . furosemide (LASIX) 40 MG tablet Take 60 mg by mouth 2 (two) times daily.      . Garlic 1000 MG CAPS Take 1 capsule by mouth daily.       Marland Kitchen losartan (COZAAR) 100 MG tablet Take 100 mg by mouth daily.      . metFORMIN (GLUCOPHAGE) 1000 MG tablet Take 1,000 mg by mouth 2 (two) times daily with a meal.      . METOLAZONE PO Take 0.5 tablets by mouth daily as needed (Take only if weight is >137 pounds.).      Marland Kitchen pioglitazone (ACTOS) 45 MG tablet Take 45 mg by mouth daily.      . potassium chloride 20 MEQ TBCR Take 20 mEq by mouth as needed (Take when you take metolazone.).  15 tablet  3  . simvastatin (ZOCOR) 80 MG tablet Take 80 mg by mouth at bedtime.       No current facility-administered medications for this visit.  Facility-Administered Medications Ordered in Other Visits  Medication Dose Route Frequency Provider Last Rate Last Dose  . albuterol (PROVENTIL) (5 MG/ML) 0.5% nebulizer solution 2.5 mg  2.5 mg Nebulization Once Purcell Nails, MD      . chlorhexidine (HIBICLENS) 4 % liquid 2 application  30 mL Topical UD Purcell Nails, MD          Physical Exam:   BP 152/50  Pulse 65  Resp 16  Ht 5\' 5"  (1.651 m)  Wt 133 lb (60.328 kg)  BMI 22.13 kg/m2  SpO2 96%  General:  Well-appearing  Chest:   Clear to auscultation  CV:   Regular rate and rhythm with systolic murmur  Incisions:  n/a  Abdomen:  Soft and nontender  Extremities:  Warm and well-perfused   Diagnostic Tests:  Transesophageal Echocardiography  Patient:    Joanna [name], Joanna [name] MR #:       [id number] Study Date: 05/19/2013 Gender:     F Age:         79 Height:     157.5cm Weight:     61.4kg BSA:        1.82m^2 Pt. Status: Room:       [facility]    PERFORMING   [place], [facility]  ADMITTING    [name]  ATTENDING    Shirlee Latch, [place]  SONOGRAPHER  Nolon Rod, RDCS  ORDERING     Ulla Potash  Rollene Rotunda cc:  ------------------------------------------------------------ LV EF: 35%  ------------------------------------------------------------ Indications:      Mitral regurgitation 424.0.  ------------------------------------------------------------ Study Conclusions  - Left ventricle: The cavity size was normal. Wall thickness   was increased increased in a pattern of mild to moderate   LVH. Septal and inferior hypokinesis, septal-lateral   dyssynchrony. The estimated ejection fraction was 35%. - Aortic valve: Trileaflet; severely calcified leaflets.   Suspect low gradient severe aortic stenosis. Mean   gradient: 32mm Hg (S). Peak gradient: 57mm Hg (S). - Aorta: Normal caliber with grade III plaque in descending   thoracic aorta. - Mitral valve: There was calcification of the mitral   annulus and calcification with some degree of fixation of   the posterior mitral leaflet. There was moderate mitral   regurgitation. No significant stenosis. - Left atrium: The atrium was mildly dilated. No evidence of   thrombus in the atrial cavity or appendage. - Pulmonary veins: No pulmonary vein systolic doppler flow   reversal noted. - Right ventricle: The cavity size was normal. Systolic   function was normal. - Right atrium: No evidence of thrombus in the atrial cavity   or appendage. - Atrial septum: No defect or patent foramen ovale was   identified. Echo contrast study showed no right-to-left   atrial level shunt, at baseline or with provocation. Transesophageal echocardiography.  2D and color Doppler. Height:  Height: 157.5cm. Height: 62in.  Weight:  Weight: 61.4kg. Weight: 135lb.  Body mass index:  BMI:  24.7kg/m^2. Body surface area:    BSA: 1.29m^2.  Blood pressure: 145/57.  Patient status:  Outpatient.  Location:  Endoscopy.   ------------------------------------------------------------  ------------------------------------------------------------ Left ventricle:  The cavity size was normal. Wall thickness was increased increased in a pattern of mild to moderate LVH. Septal and inferior hypokinesis, septal-lateral dyssynchrony. The estimated ejection fraction was 35%.  ------------------------------------------------------------ Aortic valve:   Trileaflet; severely calcified leaflets. Doppler:   No regurgitation. Suspect low gradient severe aortic stenosis.    Mean gradient: 32mm Hg (S). Peak gradient:  57mm Hg (S).  ------------------------------------------------------------ Aorta:  Normal caliber with grade III plaque in descending thoracic aorta.  ------------------------------------------------------------ Mitral valve:  There was calcification of the mitral annulus and calcification with some degree of fixation of the posterior mitral leaflet. There was moderate mitral regurgitation. No significant stenosis.  ------------------------------------------------------------ Left atrium:  The atrium was mildly dilated.  No evidence of thrombus in the atrial cavity or appendage.  ------------------------------------------------------------ Atrial septum:  No defect or patent foramen ovale was identified.  Echo contrast study showed no right-to-left atrial level shunt, at baseline or with provocation.  ------------------------------------------------------------ Pulmonary veins:  No pulmonary vein systolic doppler flow reversal noted.  ------------------------------------------------------------ Right ventricle:  The cavity size was normal. Systolic function was normal.  ------------------------------------------------------------ Pulmonic valve:    Structurally normal  valve.   Cusp separation was normal.  Doppler:   Trivial regurgitation.   ------------------------------------------------------------ Tricuspid valve:   Doppler:   Trivial regurgitation.  ------------------------------------------------------------ Right atrium:  The atrium was normal in size.  No evidence of thrombus in the atrial cavity or appendage.  ------------------------------------------------------------ Pericardium:  There was no pericardial effusion.   ------------------------------------------------------------ Post procedure conclusions Ascending Aorta:  - Normal caliber with grade III plaque in descending   thoracic aorta.  ------------------------------------------------------------  2D measurements   Normal       Doppler measurements   Normal LVOT                           Aortic valve Diam, S   20 mm   ------       Peak vel, S   357 cm/s ------ Area    3.14 cm^2 ------       Mean vel, S   265 cm/s ------                                VTI, S       89.5 cm   ------                                Mean           30 mm   ------                                gradient, S       Hg                                Peak           51 mm   ------                                gradient, S       Hg                                Mitral valve                                Regurg alias 29.7 cm/s ------  vel, PISA                                Max regurg    679 cm/s ------                                vel                                Regurg VTI    263 cm   ------                                ERO, PISA    0.33 cm^2 ------                                Regurg vol,    87 ml   ------                                PISA   ------------------------------------------------------------ Prepared and Electronically Authenticated by  [name] 2014-08-04T14:11:54.007    Cardiac Catheterization Procedure Note  Name: Joanna [name] MRN: [id number] DOB: 10-14-1930  Procedure: Selective Coronary Angiography  Indication: Severe AS, needs surgery.             Procedural details: The right groin was prepped, draped, and anesthetized with 1% lidocaine. Using modified Seldinger technique, a 4 French sheath was introduced into the right femoral artery. Several attempts were made to access the right femoral vein.  The vein was difficult to identify.  Once identified, I was unable to pass a wire into the vein.  I suspect that the patient had quite low venous pressure in the setting of being NPO and being on Lasix.  I opted not to try the left groin.  Standard Judkins catheters were used for coronary angiography. Catheter exchanges were performed over a guidewire. There were no immediate procedural complications. The patient was transferred to the post catheterization recovery area for further monitoring.  Procedural Findings: Hemodynamics:  AO 136/45              Coronary angiography: Coronary dominance: right  Left mainstem: No significant disease.   Left anterior descending (LAD): Luminal irregularities in the LAD.    Left circumflex (LCx): Large ramus with luminal irregularities. Small AV LCx without significant disease.    Right coronary artery (RCA): Luminal irregularities.  There was severe mitral annular calcification noted.  The aortic valve was calcified.     Left ventriculography: Not done (known severe AS).    Final Conclusions:  No obstructive CAD.  Heavily calcified mitral annulus.  RHC not done: very difficult to access right femoral vein and then unable to pass wire.  Suspect she was dehydrated (NPO and on Lasix).   Opted not to move the left femoral vein.    Recommendations: Will review cath and TEE with Dr. Cornelius Moras.    [name] 05/19/2013, 12:07 PM    Impression:  The patient has severe symptomatic aortic stenosis with chronic combined systolic and diastolic congestive heart failure and  nonischemic cardiomyopathy with ejection fraction estimated 30%. Transthoracic echocardiograms have demonstrated transvalvular gradients  across the aortic valve in the moderate range, but dobutamine echocardiogram clearly demonstrates the presence of contractile reserve and elevated transvalvular gradient with stress consistent with severe low-gradient aortic stenosis.  Transesophageal echocardiogram confirmed similar findings and also demonstrates at least moderate mitral regurgitation with significant restriction of the posterior leaflet of the mitral valve because of severe mitral annular calcification. Finally, the patient has chronic left bundle branch block with left ventricular ejection fraction less than 35%. It might be reasonable to consider CRT in the future depending upon the patient's recovery following surgery, and she will unquestionably be at increased risk for need of permanent pacemaker postoperatively.    Plan:  We plan to proceed with aortic valve replacement and possible mitral valve repair or replacement on Wednesday, 06/15/2013. We will plan to replace the patient's aortic valve using a bioprosthetic tissue valve, and if the mitral valve cannot be repaired we will use a bioprosthetic tissue valve for it as well. Finally, we will plan to place a permanent epicardial pacemaker lead on the lateral left ventricular surface to facilitate possible CRT in the future if necessary.  I have again reviewed the indications, risks, and potential benefits of surgery with the patient and her daughter in the office today.  They understand and accept all potential associated risks of surgery including but not limited to risk of death, stroke, myocardial infarction, congestive heart failure, respiratory failure, renal failure, bleeding requiring blood transfusion and/or reexploration, arrhythmia, heart block or bradycardia requiring permanent pacemaker, pneumonia, pleural effusion, wound infection,  pulmonary embolus or other thromboembolic complication, chronic pain or other delayed complications related to valve repair or replacement.  All questions answered.    Salvatore Decent. Cornelius Moras, MD 06/13/2013 3:23 PM

## 2013-06-13 NOTE — Progress Notes (Signed)
Dr.Benismone is Cardiologist with [facility]  Multiple echo reports in epic with most recent in 2014  Heart cath reports in epic from 2012 and 2014  Stress test in epic from 2014  Medical Md is Dr.Michael Norins

## 2013-06-13 NOTE — Progress Notes (Signed)
Confirmed with pt that dopplers were done at 1100 and PFT's were done @ 1200

## 2013-06-14 MED ORDER — POTASSIUM CHLORIDE 2 MEQ/ML IV SOLN
80.0000 meq | INTRAVENOUS | Status: DC
  Filled 2013-06-14: qty 40

## 2013-06-14 MED ORDER — SODIUM CHLORIDE 0.9 % IV SOLN
INTRAVENOUS | Status: AC
  Administered 2013-06-15: 3.8 [IU]/h via INTRAVENOUS
  Filled 2013-06-14: qty 1

## 2013-06-14 MED ORDER — DEXMEDETOMIDINE HCL IN NACL 400 MCG/100ML IV SOLN
0.1000 ug/kg/h | INTRAVENOUS | Status: AC
  Administered 2013-06-15: 0.3 ug/kg/h via INTRAVENOUS
  Filled 2013-06-14: qty 100

## 2013-06-14 MED ORDER — EPINEPHRINE HCL 1 MG/ML IJ SOLN
0.5000 ug/min | INTRAVENOUS | Status: DC
  Filled 2013-06-14: qty 4

## 2013-06-14 MED ORDER — NITROGLYCERIN IN D5W 200-5 MCG/ML-% IV SOLN
2.0000 ug/min | INTRAVENOUS | Status: AC
  Administered 2013-06-15: 5 ug/min via INTRAVENOUS
  Administered 2013-06-15: 15 ug/min via INTRAVENOUS
  Filled 2013-06-14: qty 250

## 2013-06-14 MED ORDER — SODIUM CHLORIDE 0.9 % IV SOLN
INTRAVENOUS | Status: AC
  Administered 2013-06-15: 69.8 mL/h via INTRAVENOUS
  Filled 2013-06-14: qty 40

## 2013-06-14 MED ORDER — DOPAMINE-DEXTROSE 3.2-5 MG/ML-% IV SOLN
2.0000 ug/kg/min | INTRAVENOUS | Status: AC
  Administered 2013-06-15: 3 ug/kg/min via INTRAVENOUS
  Filled 2013-06-14: qty 250

## 2013-06-14 MED ORDER — DEXTROSE 5 % IV SOLN
750.0000 mg | INTRAVENOUS | Status: DC
  Filled 2013-06-14: qty 750

## 2013-06-14 MED ORDER — MAGNESIUM SULFATE 50 % IJ SOLN
40.0000 meq | INTRAMUSCULAR | Status: DC
  Filled 2013-06-14: qty 10

## 2013-06-14 MED ORDER — VANCOMYCIN HCL 10 G IV SOLR
1250.0000 mg | INTRAVENOUS | Status: AC
  Administered 2013-06-15: 1250 mg via INTRAVENOUS
  Filled 2013-06-14: qty 1250

## 2013-06-14 MED ORDER — HEPARIN SODIUM (PORCINE) 1000 UNIT/ML IJ SOLN
INTRAMUSCULAR | Status: DC
  Filled 2013-06-14: qty 30

## 2013-06-14 MED ORDER — DEXTROSE 5 % IV SOLN
1.5000 g | INTRAVENOUS | Status: AC
  Administered 2013-06-15: 1.5 g via INTRAVENOUS
  Administered 2013-06-15: .75 g via INTRAVENOUS
  Filled 2013-06-14: qty 1.5

## 2013-06-14 MED ORDER — VANCOMYCIN HCL 1000 MG IV SOLR
INTRAVENOUS | Status: AC
  Administered 2013-06-15: 10:00:00
  Filled 2013-06-14: qty 1000

## 2013-06-14 MED ORDER — PLASMA-LYTE 148 IV SOLN
INTRAVENOUS | Status: DC
  Filled 2013-06-14: qty 2.5

## 2013-06-14 MED ORDER — PHENYLEPHRINE HCL 10 MG/ML IJ SOLN
30.0000 ug/min | INTRAVENOUS | Status: AC
  Administered 2013-06-15: 20 ug/min via INTRAVENOUS
  Filled 2013-06-14: qty 2

## 2013-06-14 NOTE — Progress Notes (Addendum)
Anesthesia Chart Review:  Patient is an 79 year old female scheduled for AV replacement, possible MV repair/replacement on 06/15/13 by Dr. Cornelius Moras.  History includes severe AS, hypertension, diabetes mellitus type 2, hyperlipidemia, PAD, CVA, CHF, anemia, nonsmoker, postoperative nausea and vomiting. PCP is Dr. Debby Bud.  Cardiologist is Dr. Gala Romney.  TEE on 05/19/13 showed: - Left ventricle: The cavity size was normal. Wall thickness was increased increased in a pattern of mild to moderate LVH. Septal and inferior hypokinesis, septal-lateral dyssynchrony. The estimated ejection fraction was 35%. - Aortic valve: Trileaflet; severely calcified leaflets. Suspect low gradient severe aortic stenosis. Mean gradient: 32mm Hg (S). Peak gradient: 57mm Hg (S). - Aorta: Normal caliber with grade III plaque in descending thoracic aorta. - Mitral valve: There was calcification of the mitral annulus and calcification with some degree of fixation of the posterior mitral leaflet. There was moderate mitral regurgitation. No significant stenosis. - Left atrium: The atrium was mildly dilated. No evidence of thrombus in the atrial cavity or appendage. - Pulmonary veins: No pulmonary vein systolic doppler flow reversal noted. - Right ventricle: The cavity size was normal. Systolic function was normal. - Right atrium: No evidence of thrombus in the atrial cavity or appendage. - Atrial septum: No defect or patent foramen ovale was identified. Echo contrast study showed no right-to-left atrial level shunt, at baseline or with provocation.  Cardiac cath on 05/19/13 showed no obstructive CAD. Heavily calcified mitral annulus. Calcified aortic valve.   Carotid duplex on 12/06/2012 showed patent left CEA with 0-39% left ICA stenosis, 40-59% right ICA stenosis, antegrade vertebral artery flow.  PFTs on 06/13/2013 showed FVC 1.88 (69%), FEV1 1.45 (72%), DLCOunc 52%.  EKG on 06/13/2013 showed normal sinus rhythm, left  axis deviation, left bundle branch block.  CXR on 06/13/2013 showed no acute abnormality noted.   Preoperative labs noted.  H/H 9.8/29.1.  T&S already done--I'll add T&C for 2 Units to have available if needed. Epic also notes that labs have been reviewed by Dr. Cornelius Moras.  PAT staff were unable to get an ABG, so it will be done on the day of surgery.  Velna Ochs [facility] Short Stay Center/Anesthesiology Phone [phone] 06/14/2013 10:14 AM

## 2013-06-15 ENCOUNTER — Inpatient Hospital Stay (HOSPITAL_COMMUNITY): Payer: Medicare Other

## 2013-06-15 ENCOUNTER — Inpatient Hospital Stay (HOSPITAL_COMMUNITY)
Admission: RE | Admit: 2013-06-15 | Discharge: 2013-06-27 | DRG: 220 | Disposition: A | Discharge status: Skilled Nursing Facility | Payer: Medicare Other | Source: Ambulatory Visit | Attending: Thoracic Surgery (Cardiothoracic Vascular Surgery) | Admitting: Thoracic Surgery (Cardiothoracic Vascular Surgery)

## 2013-06-15 ENCOUNTER — Encounter (HOSPITAL_COMMUNITY): Payer: Self-pay | Admitting: Certified Registered"

## 2013-06-15 ENCOUNTER — Encounter (HOSPITAL_COMMUNITY): Payer: Self-pay | Admitting: Vascular Surgery

## 2013-06-15 ENCOUNTER — Inpatient Hospital Stay (HOSPITAL_COMMUNITY): Payer: Medicare Other | Admitting: Certified Registered"

## 2013-06-15 ENCOUNTER — Encounter (HOSPITAL_COMMUNITY)
Admission: RE | Disposition: A | Discharge status: Skilled Nursing Facility | Payer: Self-pay | Source: Ambulatory Visit | Attending: Thoracic Surgery (Cardiothoracic Vascular Surgery)

## 2013-06-15 DIAGNOSIS — I359 Nonrheumatic aortic valve disorder, unspecified: Secondary | ICD-10-CM

## 2013-06-15 DIAGNOSIS — I2789 Other specified pulmonary heart diseases: Secondary | ICD-10-CM | POA: Diagnosis present

## 2013-06-15 DIAGNOSIS — E119 Type 2 diabetes mellitus without complications: Secondary | ICD-10-CM | POA: Diagnosis present

## 2013-06-15 DIAGNOSIS — I10 Essential (primary) hypertension: Secondary | ICD-10-CM | POA: Diagnosis present

## 2013-06-15 DIAGNOSIS — J9819 Other pulmonary collapse: Secondary | ICD-10-CM | POA: Diagnosis present

## 2013-06-15 DIAGNOSIS — I4891 Unspecified atrial fibrillation: Secondary | ICD-10-CM

## 2013-06-15 DIAGNOSIS — Z7982 Long term (current) use of aspirin: Secondary | ICD-10-CM

## 2013-06-15 DIAGNOSIS — I350 Nonrheumatic aortic (valve) stenosis: Secondary | ICD-10-CM

## 2013-06-15 DIAGNOSIS — D62 Acute posthemorrhagic anemia: Secondary | ICD-10-CM | POA: Diagnosis not present

## 2013-06-15 DIAGNOSIS — Z8619 Personal history of other infectious and parasitic diseases: Secondary | ICD-10-CM

## 2013-06-15 DIAGNOSIS — I5022 Chronic systolic (congestive) heart failure: Secondary | ICD-10-CM

## 2013-06-15 DIAGNOSIS — I519 Heart disease, unspecified: Secondary | ICD-10-CM | POA: Diagnosis present

## 2013-06-15 DIAGNOSIS — Z953 Presence of xenogenic heart valve: Secondary | ICD-10-CM

## 2013-06-15 DIAGNOSIS — I739 Peripheral vascular disease, unspecified: Secondary | ICD-10-CM | POA: Diagnosis present

## 2013-06-15 DIAGNOSIS — D6959 Other secondary thrombocytopenia: Secondary | ICD-10-CM | POA: Diagnosis present

## 2013-06-15 DIAGNOSIS — I080 Rheumatic disorders of both mitral and aortic valves: Principal | ICD-10-CM | POA: Diagnosis present

## 2013-06-15 DIAGNOSIS — K5900 Constipation, unspecified: Secondary | ICD-10-CM | POA: Diagnosis present

## 2013-06-15 DIAGNOSIS — I447 Left bundle-branch block, unspecified: Secondary | ICD-10-CM

## 2013-06-15 DIAGNOSIS — I6529 Occlusion and stenosis of unspecified carotid artery: Secondary | ICD-10-CM | POA: Diagnosis present

## 2013-06-15 DIAGNOSIS — I700 Atherosclerosis of aorta: Secondary | ICD-10-CM | POA: Diagnosis present

## 2013-06-15 DIAGNOSIS — I428 Other cardiomyopathies: Secondary | ICD-10-CM | POA: Diagnosis present

## 2013-06-15 DIAGNOSIS — Z8673 Personal history of transient ischemic attack (TIA), and cerebral infarction without residual deficits: Secondary | ICD-10-CM

## 2013-06-15 DIAGNOSIS — I5042 Chronic combined systolic (congestive) and diastolic (congestive) heart failure: Secondary | ICD-10-CM | POA: Diagnosis present

## 2013-06-15 DIAGNOSIS — I340 Nonrheumatic mitral (valve) insufficiency: Secondary | ICD-10-CM

## 2013-06-15 DIAGNOSIS — N179 Acute kidney failure, unspecified: Secondary | ICD-10-CM | POA: Diagnosis not present

## 2013-06-15 DIAGNOSIS — I059 Rheumatic mitral valve disease, unspecified: Secondary | ICD-10-CM

## 2013-06-15 DIAGNOSIS — I2510 Atherosclerotic heart disease of native coronary artery without angina pectoris: Secondary | ICD-10-CM | POA: Diagnosis present

## 2013-06-15 DIAGNOSIS — I509 Heart failure, unspecified: Secondary | ICD-10-CM | POA: Diagnosis present

## 2013-06-15 HISTORY — PX: EPICARDIAL PACING LEAD PLACEMENT: SHX6274

## 2013-06-15 HISTORY — DX: Presence of xenogenic heart valve: Z95.3

## 2013-06-15 HISTORY — PX: INTRAOPERATIVE TRANSESOPHAGEAL ECHOCARDIOGRAM: SHX5062

## 2013-06-15 HISTORY — PX: AORTIC VALVE REPLACEMENT: SHX41

## 2013-06-15 HISTORY — PX: MITRAL VALVE REPLACEMENT: SHX147

## 2013-06-15 LAB — BLOOD GAS, ARTERIAL
Acid-Base Excess: 4.4 mmol/L — ABNORMAL HIGH (ref 0.0–2.0)
Bicarbonate: 28.3 mEq/L — ABNORMAL HIGH (ref 20.0–24.0)
O2 Saturation: 99.5 %
Patient temperature: 98.6
TCO2: 29.6 mmol/L (ref 0–100)
pO2, Arterial: 101 mmHg — ABNORMAL HIGH (ref 80.0–100.0)

## 2013-06-15 LAB — POCT I-STAT 3, ART BLOOD GAS (G3+)
Acid-base deficit: 2 mmol/L (ref 0.0–2.0)
Acid-base deficit: 2 mmol/L (ref 0.0–2.0)
Bicarbonate: 25.1 mEq/L — ABNORMAL HIGH (ref 20.0–24.0)
Bicarbonate: 24.2 mEq/L — ABNORMAL HIGH (ref 20.0–24.0)
Bicarbonate: 24.4 mEq/L — ABNORMAL HIGH (ref 20.0–24.0)
Bicarbonate: 24.7 mEq/L — ABNORMAL HIGH (ref 20.0–24.0)
O2 Saturation: 99 %
O2 Saturation: 99 %
Patient temperature: 35.9
Patient temperature: 98.6
Patient temperature: 36.6
TCO2: 26 mmol/L (ref 0–100)
TCO2: 26 mmol/L (ref 0–100)
TCO2: 26 mmol/L (ref 0–100)
pCO2 arterial: 37.1 mmHg (ref 35.0–45.0)
pH, Arterial: 7.439 (ref 7.350–7.450)
pH, Arterial: 7.314 — ABNORMAL LOW (ref 7.350–7.450)
pO2, Arterial: 142 mmHg — ABNORMAL HIGH (ref 80.0–100.0)

## 2013-06-15 LAB — POCT I-STAT GLUCOSE
Glucose, Bld: 100 mg/dL — ABNORMAL HIGH (ref 70–99)
Operator id: 3406
Operator id: 3406

## 2013-06-15 LAB — CBC
HCT: 30.7 % — ABNORMAL LOW (ref 36.0–46.0)
Hemoglobin: 10.7 g/dL — ABNORMAL LOW (ref 12.0–15.0)
MCV: 91.1 fL (ref 78.0–100.0)
MCV: 90.6 fL (ref 78.0–100.0)
Platelets: 132 10*3/uL — ABNORMAL LOW (ref 150–400)
RBC: 3.69 MIL/uL — ABNORMAL LOW (ref 3.87–5.11)
RBC: 3.39 MIL/uL — ABNORMAL LOW (ref 3.87–5.11)
RDW: 14.8 % (ref 11.5–15.5)
WBC: 15.2 10*3/uL — ABNORMAL HIGH (ref 4.0–10.5)
WBC: 16.6 10*3/uL — ABNORMAL HIGH (ref 4.0–10.5)

## 2013-06-15 LAB — POCT I-STAT 4, (NA,K, GLUC, HGB,HCT)
Glucose, Bld: 184 mg/dL — ABNORMAL HIGH (ref 70–99)
Glucose, Bld: 123 mg/dL — ABNORMAL HIGH (ref 70–99)
Glucose, Bld: 92 mg/dL (ref 70–99)
HCT: 24 % — ABNORMAL LOW (ref 36.0–46.0)
HCT: 20 % — ABNORMAL LOW (ref 36.0–46.0)
HCT: 22 % — ABNORMAL LOW (ref 36.0–46.0)
HCT: 33 % — ABNORMAL LOW (ref 36.0–46.0)
Hemoglobin: 8.2 g/dL — ABNORMAL LOW (ref 12.0–15.0)
Hemoglobin: 7.5 g/dL — ABNORMAL LOW (ref 12.0–15.0)
Hemoglobin: 6.5 g/dL — CL (ref 12.0–15.0)
Hemoglobin: 11.2 g/dL — ABNORMAL LOW (ref 12.0–15.0)
Potassium: 3.8 mEq/L (ref 3.5–5.1)
Sodium: 142 mEq/L (ref 135–145)
Sodium: 142 mEq/L (ref 135–145)

## 2013-06-15 LAB — POCT I-STAT, CHEM 8
BUN: 27 mg/dL — ABNORMAL HIGH (ref 6–23)
Calcium, Ion: 1.25 mmol/L (ref 1.13–1.30)
Chloride: 109 mEq/L (ref 96–112)
Creatinine, Ser: 1 mg/dL (ref 0.50–1.10)
TCO2: 25 mmol/L (ref 0–100)

## 2013-06-15 LAB — PLATELET COUNT: Platelets: 119 10*3/uL — ABNORMAL LOW (ref 150–400)

## 2013-06-15 LAB — GLUCOSE, CAPILLARY: Glucose-Capillary: 208 mg/dL — ABNORMAL HIGH (ref 70–99)

## 2013-06-15 LAB — APTT: aPTT: 41 seconds — ABNORMAL HIGH (ref 24–37)

## 2013-06-15 LAB — CREATININE, SERUM
GFR calc Af Amer: 73 mL/min — ABNORMAL LOW (ref >90)
GFR calc non Af Amer: 63 mL/min — ABNORMAL LOW (ref >90)

## 2013-06-15 LAB — PREPARE RBC (CROSSMATCH)

## 2013-06-15 LAB — PROTIME-INR: Prothrombin Time: 17.9 seconds — ABNORMAL HIGH (ref 11.6–15.2)

## 2013-06-15 SURGERY — REPLACEMENT, AORTIC VALVE, OPEN
Anesthesia: General | Site: Chest | Wound class: Clean

## 2013-06-15 MED ORDER — PROPOFOL 10 MG/ML IV BOLUS
INTRAVENOUS | Status: DC | PRN
  Administered 2013-06-15: 90 mg via INTRAVENOUS

## 2013-06-15 MED ORDER — ACETAMINOPHEN 160 MG/5ML PO SOLN
1000.0000 mg | Freq: Four times a day (QID) | ORAL | Status: DC
  Administered 2013-06-15: 1000 mg
  Filled 2013-06-15: qty 40.6

## 2013-06-15 MED ORDER — POTASSIUM CHLORIDE 10 MEQ/50ML IV SOLN
10.0000 meq | INTRAVENOUS | Status: AC
  Administered 2013-06-15 (×3): 10 meq via INTRAVENOUS

## 2013-06-15 MED ORDER — LACTATED RINGERS IV SOLN
INTRAVENOUS | Status: DC
  Administered 2013-06-15 (×2): via INTRAVENOUS

## 2013-06-15 MED ORDER — VECURONIUM BROMIDE 10 MG IV SOLR
INTRAVENOUS | Status: DC | PRN
  Administered 2013-06-15: 2 mg via INTRAVENOUS
  Administered 2013-06-15: 10 mg via INTRAVENOUS
  Administered 2013-06-15 (×3): 2 mg via INTRAVENOUS

## 2013-06-15 MED ORDER — ACETAMINOPHEN 500 MG PO TABS
1000.0000 mg | ORAL_TABLET | Freq: Four times a day (QID) | ORAL | Status: AC
  Administered 2013-06-16 – 2013-06-20 (×15): 1000 mg via ORAL
  Filled 2013-06-15 (×19): qty 2

## 2013-06-15 MED ORDER — DOPAMINE-DEXTROSE 3.2-5 MG/ML-% IV SOLN: 0.0000 ug/kg/min | INTRAVENOUS | Status: DC

## 2013-06-15 MED ORDER — METOPROLOL TARTRATE 12.5 MG HALF TABLET
12.5000 mg | ORAL_TABLET | Freq: Two times a day (BID) | ORAL | Status: DC
  Filled 2013-06-15 (×3): qty 1

## 2013-06-15 MED ORDER — CALCIUM CHLORIDE 10 % IV SOLN
1.0000 g | Freq: Once | INTRAVENOUS | Status: AC | PRN
  Filled 2013-06-15: qty 10

## 2013-06-15 MED ORDER — LACTATED RINGERS IV SOLN
INTRAVENOUS | Status: DC | PRN
  Administered 2013-06-15: 08:00:00 via INTRAVENOUS

## 2013-06-15 MED ORDER — LACTATED RINGERS IV SOLN
INTRAVENOUS | Status: DC | PRN
  Administered 2013-06-15 (×2): via INTRAVENOUS

## 2013-06-15 MED ORDER — SODIUM CHLORIDE 0.45 % IV SOLN
INTRAVENOUS | Status: DC
  Administered 2013-06-15: 16:00:00 via INTRAVENOUS

## 2013-06-15 MED ORDER — OXYCODONE HCL 5 MG PO TABS: 5.0000 mg | ORAL_TABLET | ORAL | Status: DC | PRN

## 2013-06-15 MED ORDER — LACTATED RINGERS IV SOLN
INTRAVENOUS | Status: DC | PRN
  Administered 2013-06-15 (×2): via INTRAVENOUS

## 2013-06-15 MED ORDER — SUFENTANIL CITRATE 50 MCG/ML IV SOLN
INTRAVENOUS | Status: DC | PRN
  Administered 2013-06-15: 20 ug via INTRAVENOUS
  Administered 2013-06-15: 10 ug via INTRAVENOUS
  Administered 2013-06-15: 40 ug via INTRAVENOUS
  Administered 2013-06-15: 5 ug via INTRAVENOUS
  Administered 2013-06-15: 50 ug via INTRAVENOUS
  Administered 2013-06-15: 45 ug via INTRAVENOUS
  Administered 2013-06-15: 20 ug via INTRAVENOUS
  Administered 2013-06-15: 10 ug via INTRAVENOUS

## 2013-06-15 MED ORDER — MIDAZOLAM HCL 2 MG/2ML IJ SOLN: 2.0000 mg | INTRAMUSCULAR | Status: DC | PRN

## 2013-06-15 MED ORDER — FAMOTIDINE IN NACL 20-0.9 MG/50ML-% IV SOLN
20.0000 mg | Freq: Two times a day (BID) | INTRAVENOUS | Status: AC
  Administered 2013-06-15 (×2): 20 mg via INTRAVENOUS
  Filled 2013-06-15: qty 50

## 2013-06-15 MED ORDER — SODIUM CHLORIDE 0.9 % IV SOLN
250.0000 mL | INTRAVENOUS | Status: DC
  Administered 2013-06-16: 250 mL via INTRAVENOUS

## 2013-06-15 MED ORDER — SODIUM CHLORIDE 0.9 % IJ SOLN
OROMUCOSAL | Status: DC | PRN
  Administered 2013-06-15: 10:00:00 via TOPICAL

## 2013-06-15 MED ORDER — NITROGLYCERIN IN D5W 200-5 MCG/ML-% IV SOLN: 0.0000 ug/min | INTRAVENOUS | Status: DC

## 2013-06-15 MED ORDER — ACETAMINOPHEN 160 MG/5ML PO SOLN: 650.0000 mg | Freq: Once | ORAL | Status: AC

## 2013-06-15 MED ORDER — MORPHINE SULFATE 2 MG/ML IJ SOLN: 1.0000 mg | INTRAMUSCULAR | Status: AC | PRN

## 2013-06-15 MED ORDER — BISACODYL 5 MG PO TBEC
10.0000 mg | DELAYED_RELEASE_TABLET | Freq: Every day | ORAL | Status: DC
  Administered 2013-06-16 – 2013-06-22 (×5): 10 mg via ORAL
  Filled 2013-06-15 (×6): qty 2

## 2013-06-15 MED ORDER — DOCUSATE SODIUM 100 MG PO CAPS
200.0000 mg | ORAL_CAPSULE | Freq: Every day | ORAL | Status: DC
  Administered 2013-06-16 – 2013-06-18 (×3): 200 mg via ORAL
  Filled 2013-06-15 (×3): qty 2

## 2013-06-15 MED ORDER — SODIUM CHLORIDE 0.9 % IJ SOLN: 3.0000 mL | INTRAMUSCULAR | Status: DC | PRN

## 2013-06-15 MED ORDER — INSULIN REGULAR BOLUS VIA INFUSION
0.0000 [IU] | Freq: Three times a day (TID) | INTRAVENOUS | Status: DC
  Filled 2013-06-15: qty 10

## 2013-06-15 MED ORDER — DEXTROSE 5 % IV SOLN
30.0000 ug/min | INTRAVENOUS | Status: DC
  Filled 2013-06-15: qty 2

## 2013-06-15 MED ORDER — ARTIFICIAL TEARS OP OINT
TOPICAL_OINTMENT | OPHTHALMIC | Status: DC | PRN
  Administered 2013-06-15: 1 via OPHTHALMIC

## 2013-06-15 MED ORDER — CEFUROXIME SODIUM 1.5 G IJ SOLR
1.5000 g | Freq: Two times a day (BID) | INTRAMUSCULAR | Status: AC
  Administered 2013-06-15 – 2013-06-17 (×4): 1.5 g via INTRAVENOUS
  Filled 2013-06-15 (×4): qty 1.5

## 2013-06-15 MED ORDER — PANTOPRAZOLE SODIUM 40 MG PO TBEC
40.0000 mg | DELAYED_RELEASE_TABLET | Freq: Every day | ORAL | Status: DC
  Administered 2013-06-17 – 2013-06-27 (×10): 40 mg via ORAL
  Filled 2013-06-15 (×9): qty 1

## 2013-06-15 MED ORDER — PHENYLEPHRINE HCL 10 MG/ML IJ SOLN
0.0000 ug/min | INTRAVENOUS | Status: DC
  Filled 2013-06-15: qty 2

## 2013-06-15 MED ORDER — PROTAMINE SULFATE 10 MG/ML IV SOLN
INTRAVENOUS | Status: DC | PRN
  Administered 2013-06-15: 220 mg via INTRAVENOUS

## 2013-06-15 MED ORDER — SODIUM CHLORIDE 0.9 % IV SOLN
INTRAVENOUS | Status: DC
  Filled 2013-06-15: qty 1

## 2013-06-15 MED ORDER — SODIUM CHLORIDE 0.9 % IJ SOLN
3.0000 mL | Freq: Two times a day (BID) | INTRAMUSCULAR | Status: DC
  Administered 2013-06-16 – 2013-06-19 (×4): 3 mL via INTRAVENOUS

## 2013-06-15 MED ORDER — VANCOMYCIN HCL IN DEXTROSE 1-5 GM/200ML-% IV SOLN
1000.0000 mg | Freq: Once | INTRAVENOUS | Status: AC
  Administered 2013-06-15: 1000 mg via INTRAVENOUS
  Filled 2013-06-15: qty 200

## 2013-06-15 MED ORDER — METOPROLOL TARTRATE 25 MG/10 ML ORAL SUSPENSION
12.5000 mg | Freq: Two times a day (BID) | ORAL | Status: DC
  Filled 2013-06-15 (×3): qty 5

## 2013-06-15 MED ORDER — ACETAMINOPHEN 650 MG RE SUPP
650.0000 mg | Freq: Once | RECTAL | Status: AC
  Administered 2013-06-15: 650 mg via RECTAL

## 2013-06-15 MED ORDER — POTASSIUM CHLORIDE 10 MEQ/50ML IV SOLN
10.0000 meq | INTRAVENOUS | Status: DC | PRN
  Administered 2013-06-15: 10 meq via INTRAVENOUS
  Filled 2013-06-15: qty 150
  Filled 2013-06-15: qty 50

## 2013-06-15 MED ORDER — BISACODYL 10 MG RE SUPP: 10.0000 mg | Freq: Every day | RECTAL | Status: DC

## 2013-06-15 MED ORDER — MILRINONE IN DEXTROSE 20 MG/100ML IV SOLN
0.2500 ug/kg/min | INTRAVENOUS | Status: DC
  Filled 2013-06-15: qty 100

## 2013-06-15 MED ORDER — MORPHINE SULFATE 2 MG/ML IJ SOLN: 2.0000 mg | INTRAMUSCULAR | Status: DC | PRN

## 2013-06-15 MED ORDER — SODIUM CHLORIDE 0.9 % IV SOLN
INTRAVENOUS | Status: DC
  Administered 2013-06-15: 16:00:00 via INTRAVENOUS

## 2013-06-15 MED ORDER — ONDANSETRON HCL 4 MG/2ML IJ SOLN
4.0000 mg | Freq: Four times a day (QID) | INTRAMUSCULAR | Status: DC | PRN
  Administered 2013-06-16 – 2013-06-22 (×4): 4 mg via INTRAVENOUS
  Filled 2013-06-15 (×4): qty 2

## 2013-06-15 MED ORDER — METOPROLOL TARTRATE 1 MG/ML IV SOLN
2.5000 mg | INTRAVENOUS | Status: DC | PRN
  Administered 2013-06-17: 5 mg via INTRAVENOUS

## 2013-06-15 MED ORDER — DEXMEDETOMIDINE HCL IN NACL 200 MCG/50ML IV SOLN: 0.1000 ug/kg/h | INTRAVENOUS | Status: DC

## 2013-06-15 MED ORDER — ALBUMIN HUMAN 5 % IV SOLN
250.0000 mL | INTRAVENOUS | Status: AC | PRN
  Administered 2013-06-15 (×2): 250 mL via INTRAVENOUS
  Filled 2013-06-15: qty 250

## 2013-06-15 MED ORDER — SODIUM CHLORIDE 0.9 % IR SOLN
Status: DC | PRN
  Administered 2013-06-15: 1000 mL
  Administered 2013-06-15: 3000 mL

## 2013-06-15 MED ORDER — ASPIRIN EC 325 MG PO TBEC
325.0000 mg | DELAYED_RELEASE_TABLET | Freq: Every day | ORAL | Status: DC
  Administered 2013-06-16: 325 mg via ORAL
  Filled 2013-06-15 (×2): qty 1

## 2013-06-15 MED ORDER — ASPIRIN 81 MG PO CHEW: 324.0000 mg | CHEWABLE_TABLET | Freq: Every day | ORAL | Status: DC

## 2013-06-15 MED ORDER — MAGNESIUM SULFATE 40 MG/ML IJ SOLN
4.0000 g | Freq: Once | INTRAMUSCULAR | Status: AC
  Administered 2013-06-15: 4 g via INTRAVENOUS
  Filled 2013-06-15: qty 100

## 2013-06-15 MED ORDER — HEPARIN SODIUM (PORCINE) 1000 UNIT/ML IJ SOLN
INTRAMUSCULAR | Status: DC | PRN
  Administered 2013-06-15: 18000 [IU] via INTRAVENOUS

## 2013-06-15 MED ORDER — MIDAZOLAM HCL 5 MG/5ML IJ SOLN
INTRAMUSCULAR | Status: DC | PRN
  Administered 2013-06-15 (×2): 2 mg via INTRAVENOUS
  Administered 2013-06-15: 3 mg via INTRAVENOUS
  Administered 2013-06-15: 1 mg via INTRAVENOUS
  Administered 2013-06-15 (×2): 2 mg via INTRAVENOUS
  Administered 2013-06-15: 1 mg via INTRAVENOUS
  Administered 2013-06-15: 2 mg via INTRAVENOUS

## 2013-06-15 MED ORDER — METOPROLOL TARTRATE 12.5 MG HALF TABLET
12.5000 mg | ORAL_TABLET | Freq: Once | ORAL | Status: DC
  Filled 2013-06-15: qty 1

## 2013-06-15 SURGICAL SUPPLY — 123 items
ADAPTER CARDIO PERF ANTE/RETRO (ADAPTER) ×3 IMPLANT
ANTEGRADE CPLG (MISCELLANEOUS) ×3 IMPLANT
APPLICATOR COTTON TIP 6IN STRL (MISCELLANEOUS) IMPLANT
ATTRACTOMAT 16X20 MAGNETIC DRP (DRAPES) ×3 IMPLANT
BAG DECANTER FOR FLEXI CONT (MISCELLANEOUS) IMPLANT
BLADE STERNUM SYSTEM 6 (BLADE) ×3 IMPLANT
BLADE SURG 11 STRL SS (BLADE) ×3 IMPLANT
CANISTER SUCTION 2500CC (MISCELLANEOUS) ×3 IMPLANT
CANN PRFSN 3/8X14X24FR PCFC (MISCELLANEOUS)
CANN PRFSN 3/8XCNCT ST RT ANG (MISCELLANEOUS)
CANNULA EZ GLIDE AORTIC 21FR (CANNULA) ×3 IMPLANT
CANNULA FEM VENOUS REMOTE 22FR (CANNULA) ×3 IMPLANT
CANNULA GUNDRY RCSP 15FR (MISCELLANEOUS) ×3 IMPLANT
CANNULA PRFSN 3/8X14X24FR PCFC (MISCELLANEOUS) IMPLANT
CANNULA PRFSN 3/8XCNCT RT ANG (MISCELLANEOUS) IMPLANT
CANNULA SOFTFLOW AORTIC 7M21FR (CANNULA) ×3 IMPLANT
CANNULA VEN MTL TIP RT (MISCELLANEOUS)
CANNULA VENNOUS METAL TIP 20FR (CANNULA) ×3 IMPLANT
CATH CPB KIT OWEN (MISCELLANEOUS) IMPLANT
CATH FOLEY 2WAY SLVR  5CC 14FR (CATHETERS)
CATH FOLEY 2WAY SLVR 5CC 14FR (CATHETERS) IMPLANT
CATH HEART VENT LEFT (CATHETERS) ×2 IMPLANT
CATH THORACIC 28FR RT ANG (CATHETERS) IMPLANT
CATH THORACIC 36FR (CATHETERS) ×3 IMPLANT
CATH THORACIC 36FR RT ANG (CATHETERS) IMPLANT
CLIP FOGARTY SPRING 6M (CLIP) IMPLANT
CLOTH BEACON ORANGE TIMEOUT ST (SAFETY) ×3 IMPLANT
CONN 1/2X1/2X1/2  BEN (MISCELLANEOUS) ×1
CONN 1/2X1/2X1/2 BEN (MISCELLANEOUS) ×2 IMPLANT
CONN 3/8X1/2 ST GISH (MISCELLANEOUS) ×6 IMPLANT
CONT SPEC 4OZ CLIKSEAL STRL BL (MISCELLANEOUS) ×3 IMPLANT
COVER MAYO STAND STRL (DRAPES) ×3 IMPLANT
COVER SURGICAL LIGHT HANDLE (MISCELLANEOUS) ×3 IMPLANT
CRADLE DONUT ADULT HEAD (MISCELLANEOUS) ×3 IMPLANT
DERMABOND ADVANCED (GAUZE/BANDAGES/DRESSINGS) ×2
DERMABOND ADVANCED .7 DNX12 (GAUZE/BANDAGES/DRESSINGS) ×4 IMPLANT
DRAIN CHANNEL 32F RND 10.7 FF (WOUND CARE) ×9 IMPLANT
DRAPE BILATERAL SPLIT (DRAPES) IMPLANT
DRAPE CARDIOVASCULAR INCISE (DRAPES)
DRAPE CV SPLIT W-CLR ANES SCRN (DRAPES) ×3 IMPLANT
DRAPE INCISE IOBAN 66X45 STRL (DRAPES) ×9 IMPLANT
DRAPE SLUSH/WARMER DISC (DRAPES) ×3 IMPLANT
DRAPE SRG 135X102X78XABS (DRAPES) IMPLANT
DRSG COVADERM 4X14 (GAUZE/BANDAGES/DRESSINGS) ×3 IMPLANT
ELECT REM PT RETURN 9FT ADLT (ELECTROSURGICAL) ×6
ELECTRODE REM PT RTRN 9FT ADLT (ELECTROSURGICAL) ×4 IMPLANT
GLOVE BIO SURGEON STRL SZ 6 (GLOVE) ×6 IMPLANT
GLOVE BIO SURGEON STRL SZ 6.5 (GLOVE) ×15 IMPLANT
GLOVE BIO SURGEON STRL SZ7 (GLOVE) IMPLANT
GLOVE BIO SURGEON STRL SZ7.5 (GLOVE) ×6 IMPLANT
GLOVE BIOGEL PI IND STRL 6 (GLOVE) ×4 IMPLANT
GLOVE BIOGEL PI IND STRL 6.5 (GLOVE) ×10 IMPLANT
GLOVE BIOGEL PI IND STRL 7.0 (GLOVE) ×6 IMPLANT
GLOVE BIOGEL PI INDICATOR 6 (GLOVE) ×2
GLOVE BIOGEL PI INDICATOR 6.5 (GLOVE) ×5
GLOVE BIOGEL PI INDICATOR 7.0 (GLOVE) ×3
GLOVE ORTHO TXT STRL SZ7.5 (GLOVE) ×9 IMPLANT
GOWN PREVENTION PLUS XLARGE (GOWN DISPOSABLE) ×3 IMPLANT
GOWN STRL NON-REIN LRG LVL3 (GOWN DISPOSABLE) ×21 IMPLANT
HEMOSTAT POWDER SURGIFOAM 1G (HEMOSTASIS) ×12 IMPLANT
INSERT FOGARTY XLG (MISCELLANEOUS) ×3 IMPLANT
KIT BASIN OR (CUSTOM PROCEDURE TRAY) ×3 IMPLANT
KIT DILATOR VASC 18G NDL (KITS) ×3 IMPLANT
KIT DRAINAGE VACCUM ASSIST (KITS) ×3 IMPLANT
KIT ROOM TURNOVER OR (KITS) ×3 IMPLANT
KIT SUCTION CATH 14FR (SUCTIONS) ×18 IMPLANT
LEAD PACING EPI (Prosthesis & Implant Heart) ×6 IMPLANT
LEAD PACING MYOCARDI (MISCELLANEOUS) ×3 IMPLANT
LINE VENT (MISCELLANEOUS) ×6 IMPLANT
MARKER GRAFT CORONARY BYPASS (MISCELLANEOUS) IMPLANT
NS IRRIG 1000ML POUR BTL (IV SOLUTION) ×21 IMPLANT
PACK OPEN HEART (CUSTOM PROCEDURE TRAY) ×3 IMPLANT
PAD ARMBOARD 7.5X6 YLW CONV (MISCELLANEOUS) ×6 IMPLANT
SET CARDIOPLEGIA MPS 5001102 (MISCELLANEOUS) ×3 IMPLANT
SET IRRIG TUBING LAPAROSCOPIC (IRRIGATION / IRRIGATOR) ×3 IMPLANT
SOLUTION ANTI FOG 6CC (MISCELLANEOUS) ×3 IMPLANT
SPONGE GAUZE 4X4 12PLY (GAUZE/BANDAGES/DRESSINGS) ×9 IMPLANT
SUCKER INTRACARDIAC WEIGHTED (SUCKER) ×3 IMPLANT
SUT BONE WAX W31G (SUTURE) ×3 IMPLANT
SUT ETHIBON 2 0 V 52N 30 (SUTURE) ×12 IMPLANT
SUT ETHIBON EXCEL 2-0 V-5 (SUTURE) ×36 IMPLANT
SUT ETHIBOND 2 0 SH (SUTURE) ×8 IMPLANT
SUT ETHIBOND 2 0 SH 36X2 (SUTURE) ×4 IMPLANT
SUT ETHIBOND 2 0 V4 (SUTURE) IMPLANT
SUT ETHIBOND 2 0V4 GREEN (SUTURE) IMPLANT
SUT ETHIBOND 4 0 RB 1 (SUTURE) IMPLANT
SUT ETHIBOND 4 0 TF (SUTURE) IMPLANT
SUT ETHIBOND 5 0 C 1 30 (SUTURE) ×3 IMPLANT
SUT ETHIBOND V-5 VALVE (SUTURE) ×27 IMPLANT
SUT ETHIBOND X763 2 0 SH 1 (SUTURE) ×9 IMPLANT
SUT MNCRL AB 3-0 PS2 18 (SUTURE) ×9 IMPLANT
SUT PDS AB 1 CTX 36 (SUTURE) ×6 IMPLANT
SUT PROLENE 3 0 SH 1 (SUTURE) ×6 IMPLANT
SUT PROLENE 3 0 SH DA (SUTURE) ×3 IMPLANT
SUT PROLENE 4 0 RB 1 (SUTURE) ×11
SUT PROLENE 4 0 SH DA (SUTURE) ×9 IMPLANT
SUT PROLENE 4-0 RB1 .5 CRCL 36 (SUTURE) ×22 IMPLANT
SUT PROLENE 5 0 C 1 36 (SUTURE) IMPLANT
SUT PROLENE 6 0 C 1 30 (SUTURE) IMPLANT
SUT SILK  1 MH (SUTURE) ×4
SUT SILK 1 MH (SUTURE) ×8 IMPLANT
SUT SILK 2 0 SH CR/8 (SUTURE) IMPLANT
SUT SILK 3 0 SH CR/8 (SUTURE) IMPLANT
SUT STEEL 6MS V (SUTURE) IMPLANT
SUT STEEL STERNAL CCS#1 18IN (SUTURE) IMPLANT
SUT STEEL SZ 6 DBL 3X14 BALL (SUTURE) IMPLANT
SUT VIC AB 2-0 CTX 27 (SUTURE) IMPLANT
SUT VIC AB 3-0 SH 8-18 (SUTURE) ×3 IMPLANT
SUTURE E-PAK OPEN HEART (SUTURE) ×3 IMPLANT
SYSTEM SAHARA CHEST DRAIN ATS (WOUND CARE) ×6 IMPLANT
TABLE PACK (MISCELLANEOUS) ×3 IMPLANT
TAPE CLOTH SURG 4X10 WHT LF (GAUZE/BANDAGES/DRESSINGS) ×3 IMPLANT
TAPE PAPER 2X10 WHT MICROPORE (GAUZE/BANDAGES/DRESSINGS) ×3 IMPLANT
TOWEL OR 17X24 6PK STRL BLUE (TOWEL DISPOSABLE) ×6 IMPLANT
TOWEL OR 17X26 10 PK STRL BLUE (TOWEL DISPOSABLE) ×6 IMPLANT
TRAY FOLEY IC TEMP SENS 14FR (CATHETERS) ×3 IMPLANT
TUBE SUCT INTRACARD DLP 20F (MISCELLANEOUS) ×3 IMPLANT
TUBING INSUFFLATION 10FT LAP (TUBING) IMPLANT
UNDERPAD 30X30 INCONTINENT (UNDERPADS AND DIAPERS) ×3 IMPLANT
VALVE MAGNA EASE 21MM (Prosthesis & Implant Heart) ×3 IMPLANT
VALVE MAGNA MITRAL 25MM (Prosthesis & Implant Heart) ×3 IMPLANT
VENT LEFT HEART 12002 (CATHETERS) ×3
WATER STERILE IRR 1000ML POUR (IV SOLUTION) ×6 IMPLANT

## 2013-06-15 NOTE — Anesthesia Postprocedure Evaluation (Signed)
  Anesthesia Post-op Note  Patient: Joanna [name]  Procedure(s) Performed: Procedure(s): AORTIC VALVE REPLACEMENT (AVR) (N/A) INTRAOPERATIVE TRANSESOPHAGEAL ECHOCARDIOGRAM (N/A) MITRAL VALVE (MV) REPLACEMENT (N/A) EPICARDIAL PACING LEAD PLACEMENT (N/A)  Patient Location: ICU  Anesthesia Type:General  Level of Consciousness: unresponsive and Patient remains intubated per anesthesia plan  Airway and Oxygen Therapy: Patient remains intubated per anesthesia plan and Patient placed on Ventilator (see vital sign flow sheet for setting)  Post-op Pain: none  Post-op Assessment: Post-op Vital signs reviewed, Patient's Cardiovascular Status Stable and Respiratory Function Stable  Post-op Vital Signs: Reviewed and stable  Complications: No apparent anesthesia complications

## 2013-06-15 NOTE — Transfer of Care (Signed)
Immediate Anesthesia Transfer of Care Note  Patient: Joanna [name]  Procedure(s) Performed: Procedure(s): AORTIC VALVE REPLACEMENT (AVR) (N/A) INTRAOPERATIVE TRANSESOPHAGEAL ECHOCARDIOGRAM (N/A) MITRAL VALVE (MV) REPLACEMENT (N/A) EPICARDIAL PACING LEAD PLACEMENT (N/A)  Patient Location: ICU  Anesthesia Type:General  Level of Consciousness: unresponsive and Patient remains intubated per anesthesia plan  Airway & Oxygen Therapy: Patient remains intubated per anesthesia plan and Patient placed on Ventilator (see vital sign flow sheet for setting)  Post-op Assessment: Post -op Vital signs reviewed and stable  Post vital signs: Reviewed and stable  Complications: No apparent anesthesia complications

## 2013-06-15 NOTE — Progress Notes (Signed)
Wean attempted at 1935 on PS10/+5/40%. Nif-18 with VC=0.5. ABG results were acidotic. Placed patient back on full support at this time with the  following settings Simv 12/500/40%/+5/PS10. After two hour another arterial gas was drawn.Ph level=7.31,CO2=49,Po2=148,HCO3=25. Patients respiratory rate was increased to 14bpm with tidal volume increased to 759ml(12ml/kg). Nurse aware of changes.

## 2013-06-15 NOTE — Op Note (Addendum)
CARDIOTHORACIC SURGERY OPERATIVE NOTE  Date of Procedure:  06/15/2013  Preoperative Diagnosis:   Severe Aortic Stenosis   Severe Mitral Regurgitation  Chronic Left Bundle Branch Block with Non-ischemic Cardiomyopathy  Postoperative Diagnosis:    Severe Aortic Stenosis   Moderate Mitral Regurgitation  Chronic Left Bundle Branch Block with Non-ischemic Cardiomyopathy   Procedure:    Aortic Valve Replacement  [facility]wards Magna Ease Pericardial Tissue Valve (size 21 mm, model # 3300TFX, serial # [id number])  Mitral Valve Replacement  [facility] Mitral Pericardial Tissue Valve (size 25 mm, model # 7300TFX, serial # [id number])  Placement of Left Ventricular Epicardial Pacemaker Leads x2  Surgeon: Salvatore Decent. Cornelius Moras, MD  Assistant: Rowe Clack, PA-C  Anesthesia: Jarvis Morgan. Hodierne, MD  Operative Findings: Severe calcific aortic stenosis  Type IIIa mitral valve dysfunction due to severe mitral annular and leaflet calcification  Moderate mitral regurgitation  Moderate LV systolic dysfunction  Moderate LV hypertrophy             BRIEF CLINICAL NOTE AND INDICATIONS FOR SURGERY  Patient is an 79 year old widowed white female from [facility] with aortic stenosis, hypertension, type 2 diabetes mellitus, hyperlipidemia, and peripheral arterial disease. The patient states that she has been told that she had a heart murmur all of her life. She has remained remarkably active and otherwise healthy, and she continues to work part-time and remained completely functionally independent. She first presented with acute systolic congestive heart failure with syncope in November of 2012. At that time echocardiogram revealed moderate aortic stenosis with left ventricular ejection fraction 30%. Left and right heart catheterization was performed demonstrating normal coronary artery anatomy. There was moderate pulmonary hypertension with normal cardiac output. Mean gradient across  the aortic valve at catheterization was measured 12 mm mercury. The patient was also noted to have iron deficient anemia at the time. Stool guaiac was reportedly negative. The patient was treated medically and has been followed ever since in the heart failure clinic. Repeat transthoracic echocardiogram performed last month demonstrated left ventricular ejection fraction 30% with moderate to severe aortic stenosis. Peak velocity across the aortic valve measured 3.7 m/s corresponding to peak and mean transvalvular gradients of 63 and 33 mm mercury respectively. There was moderate mitral regurgitation with moderate pulmonary hypertension, and septal lateral dyssynchrony. At that time the patient reported stable class II symptoms of mild exertional shortness of breath. However, while she was in the clinic she developed sudden onset of severe diaphoresis and dyspnea and briefly passed out. She did not have any chest pain. Oxygen saturations transiently dropped into the 70s wall systolic blood pressure was elevated to greater than 160. Electrocardiogram revealed sinus rhythm with left bundle branch block. She was sent directly to emergency room and subsequently hospitalized. Chest x-ray showed diffuse pulmonary edema and pro BNP levels were elevated, as were troponin. She was treated with intravenous Lasix and rapidly improved. Dobutamine stress echocardiogram was performed and confirmed that the patient's gradient across the aortic valve increased substantially with stress, confirming the presence of severe aortic stenosis. The patient was discharged from the hospital with instructions to followup in our office to consider possible surgical intervention.   The patient was originally seen in consultation on 05/12/2013. Since then she underwent transesophageal echocardiogram and left heart catheterization by Dr. Shirlee Latch on 05/19/2013. Transesophageal echocardiogram confirmed the presence of likely low-gradient severe  aortic stenosis and also documented the presence of at least moderate mitral regurgitation. There was severe calcification of the posterior mitral annulus  with significant restriction of the posterior leaflet. Left heart catheterization was notable for the absence of significant coronary artery disease and also confirmed the presence of severe mitral annular calcification. Right heart catheterization was attempted but could not be performed. Since then the patient has been seen in followup in the heart failure clinic by Dr. Gala Romney. He noted that the patient has chronic left bundle branch block with ejection fraction less than 35% and significant septal-lateral dyssynchrony. As a result, he suggested that she might benefit from CRT in the future.   The patient has been seen in consultation and counseled at length regarding the indications, risks and potential benefits of surgery.  All questions have been answered, and the patient provides full informed consent for the operation as described.     DETAILS OF THE OPERATIVE PROCEDURE  Preparation:  The patient is brought to the operating room on the above mentioned date and central monitoring was established by the anesthesia team including placement of Swan-Ganz catheter and radial arterial line. The patient is placed in the supine position on the operating table.  Intravenous antibiotics are administered. General endotracheal anesthesia is induced uneventfully. A Foley catheter is placed.  Baseline transesophageal echocardiogram was performed.  Findings were notable for severe aortic stenosis.  There was moderate mitral regurgitation. The posterior leaflet of the mitral valve was restricted during both systole and diastole. The jet of regurgitation was central. There was moderate left ventricular systolic dysfunction with moderate left ventricular hypertrophy.  The patient's chest, abdomen, both groins, and both lower extremities are prepared and  draped in a sterile manner. A time out procedure is performed.   Surgical Approach:  A median sternotomy incision was performed and the pericardium is opened. The ascending aorta is normal in appearance.    Extracorporeal Cardiopulmonary Bypass and Myocardial Protection:  The right common femoral vein is cannulated using the Seldinger technique and a guidewire advanced into the right atrium using TEE guidance.  The patient is heparinized systemically and the right common femoral vein cannulated using a 22 Fr long femoral venous cannula.  The ascending aorta is cannulated for cardiopulmonary bypass.  Adequate heparinization is verified.   A retrograde cardioplegia cannula is placed through the right atrium into the coronary sinus.  The operative field was continuously flooded with carbon dioxide gas.  The entire pre-bypass portion of the operation was notable for stable hemodynamics.  Cardiopulmonary bypass was begun and the surface of the heart is inspected.   A second venous cannula is placed directly into the superior vena cava.  A cardioplegia cannula is placed in the ascending aorta.  A temperature probe was placed in the interventricular septum.  The patient is cooled to 32C systemic temperature.  The aortic cross clamp is applied and cold blood cardioplegia is delivered initially in an antegrade fashion through the aortic root.   Supplemental cardioplegia is given retrograde through the coronary sinus catheter.  Iced saline slush is applied for topical hypothermia.  The initial cardioplegic arrest is rapid with early diastolic arrest.  Repeat doses of cardioplegia are administered intermittently throughout the entire cross clamp portion of the operation through the coronary sinus catheter in order to maintain completely flat electrocardiogram and septal myocardial temperature below 15C.  Myocardial protection was felt to be excellent.   Placement of Left Ventricular Epicardial Pacemaker  Leads:  The heart is retracted to expose the lateral wall of the left ventricle. A pair of screw-in unipolar epicardial pacemaker leads (Medtronic model #5071-35cm, serial #'  s [id number] and [id number]) are placed into the lateral wall of the left ventricle. The cables from each of the pacemaker leads are tunneled through the pericardium into the left pleural space for retrieval later during the procedure.   Mitral Valve Replacement:  A left atriotomy incision was performed through the interatrial groove and extended partially across the back wall of the left atrium after opening the oblique sinus inferiorly.  The mitral valve is exposed using a self-retaining retractor.  The mitral valve was inspected and notable for severe calcification of the entire posterior annulus.  The calcification in the annulus extends into the posterior leaflet which was severely restricted. Simple ring annuloplasty would clearly be insufficient and an attempt at repair would be complicated requiring patch augmentation of the posterior leaflet and the calcification of much of the annulus. Valve replacement using cord preserving technique was chosen as an alternative .   The anterior leaflet of the mitral valve was excised sharply, leaving a small rim of the free margin and the associated primary chords.  The posterior leaflet split in the midline.  The mitral annulus was sized to accept a 25 mm prosthesis.  The left ventricle was irrigated with copious cold saline solution.  Mitral valve replacement was performed using interrupted horizontal mattress 2-0 Ethibond pledgeted sutures with pledgets in the supraannular position.  The remaining portions of the anterior leaflet were incorporated into the suture line laterally, thereby preserving chords to both the anterior and posterior leaflet.  An [facility] Mitral bovine bioprosthetic tissue valve (size 25 mm, model # 7300TFX, serial # [id number]) was implanted uneventfully. The  valve seated appropriately with care to position the commissure posts away from the left ventricular outflow tract.  The atriotomy was closed using a 2-layer closure of running 3-0 Prolene suture after placing a sump drain across the mitral valve to serve as a left ventricular vent.     Aortic Valve Replacement:  An oblique transverse aortotomy incision was performed.  The aortic valve was inspected and notable for severe aortic stenosis.  The aortic valve leaflets were excised sharply and the aortic annulus decalcified.  Decalcification was notably straightforward.  The aortic annulus was sized to accept a 21 mm prosthesis.  The aortic root and left ventricle were irrigated with copious cold saline solution.  Aortic valve replacement was performed using interrupted horizontal mattress 2-0 Ethibond pledgeted sutures with pledgets in the subannular position.  An [facility] Ease pericardial tissue valve (size 21 mm, model # 3300TFX, serial # [id number]) was implanted uneventfully. The valve seated appropriately with adequate space beneath the left main and right coronary artery.   Procedure Completion:  The aortotomy was closed using a 2-layer closure of running 4-0 Prolene suture.  One final dose of warm retrograde "hot shot" cardioplegia was administered retrograde through the coronary sinus catheter while all air was evacuated through the aortic root.  The aortic cross clamp was removed after a total cross clamp time of 144 minutes.  Epicardial pacing wires are fixed to the right ventricular outflow tract and to the right atrial appendage. The patient is rewarmed to 37C temperature. The aortic and left ventricular vents are removed.  The patient is weaned and disconnected from cardiopulmonary bypass.  The patient's rhythm at separation from bypass was AV paced.  The patient was weaned from cardioplegic bypass on low dose dopamine. Total cardiopulmonary bypass time for the operation was 169  minutes.  Followup transesophageal echocardiogram performed after separation from bypass revealed a  well-seated aortic valve prosthesis that was functioning normally and without any sign of perivalvular leak.  Similarly, there was a well-seated mitral valve prosthesis that was functioning normally and without any sign of perivalvular leak.  Left ventricular function was unchanged from preoperatively.  After separation from cardioplegic bypass each of the unipolar left ventricular epicardial pacemaker leads were tested for appropriate sensing and pacing thresholds. Each lead was subsequently capped and tunneled through the left anterior chest wall into a small subcutaneous pocket placed overlying the second intercostal space.  The aortic and superior vena cava cannula were removed uneventfully. Protamine was administered to reverse the anticoagulation. The femoral venous cannula was removed and manual pressure held on the groin for 30 minutes.  The mediastinum and pleural space were inspected for hemostasis and irrigated with saline solution. The mediastinum and both pleural spaces were drained using 4 chest tubes placed through separate stab incisions inferiorly.  The soft tissues anterior to the aorta were reapproximated loosely. The sternum is closed with double strength sternal wire. The soft tissues anterior to the sternum were closed in multiple layers and the skin is closed with a running subcuticular skin closure.  The small incision in the left anterior chest wall from pacemaker lead pocket was closed using multiple layers of absorbable suture with a subcuticular skin closure.  The post-bypass portion of the operation was notable for stable rhythm and hemodynamics.  The patient was transfused a total of 2 units packed red blood cells during cardioplegia bypass because of anemia which was present preoperatively and exacerbated with hemodilution and acute blood loss.   Disposition:  The patient  tolerated the procedure well and is transported to the surgical intensive care in stable condition. There are no intraoperative complications. All sponge instrument and needle counts are verified correct at completion of the operation.    Salvatore Decent. Cornelius Moras MD 06/15/2013 2:22 PM

## 2013-06-15 NOTE — OR Nursing (Signed)
Second call made to 2300 at 1400; spoke with Wynona Canes, Charity fundraiser.

## 2013-06-15 NOTE — Interval H&P Note (Signed)
History and Physical Interval Note:  06/15/2013 7:21 AM  Joanna [name]  has presented today for surgery, with the diagnosis of AS MR  The various methods of treatment have been discussed with the patient and family. After consideration of risks, benefits and other options for treatment, the patient has consented to  Procedure(s) with comments: AORTIC VALVE REPLACEMENT (AVR) (N/A) MITRAL VALVE REPAIR (MVR) (N/A) - OR REPLACEMENT INTRAOPERATIVE TRANSESOPHAGEAL ECHOCARDIOGRAM (N/A) EPICARDIAL PACING LEAD PLACEMENT (N/A) as a surgical intervention .  The patient's history has been reviewed, patient examined, no change in status, stable for surgery.  I have reviewed the patient's chart and labs.  Questions were answered to the patient's satisfaction.     Vint Pola H

## 2013-06-15 NOTE — OR Nursing (Signed)
First call made to 2300 at 1334; spoke with Wynona Canes, RN.  Family notified at 1334 that patient was off pump; spoke with volunteer.

## 2013-06-15 NOTE — Progress Notes (Signed)
Patient ID: Joanna [name], female   DOB: [birth year], 79 y.o.   MRN: [id number]  SICU Evening Rounds:  Hemodynamically stable. CI = 2.1 on dop 3, neo 18  Still on vent, sleepy and failed initial wean attempt  Urine output ok  CT output low.  BMET    Component Value Date/Time   NA 141 06/15/2013 1506   K 3.4* 06/15/2013 1506   CL 98 06/13/2013 1351   CO2 30 06/13/2013 1351   GLUCOSE 99 06/15/2013 1506   BUN 36* 06/13/2013 1351   CREATININE 1.28* 06/13/2013 1351   CALCIUM 10.2 06/13/2013 1351   GFRNONAA 38* 06/13/2013 1351   GFRAA 45* 06/13/2013 1351    CBC    Component Value Date/Time   WBC 15.2* 06/15/2013 1500   RBC 3.69* 06/15/2013 1500   RBC 2.78* 09/16/2011 1539   HGB 11.2* 06/15/2013 1506   HCT 33.0* 06/15/2013 1506   PLT 132* 06/15/2013 1500   MCV 91.1 06/15/2013 1500   MCH 31.4 06/15/2013 1500   MCHC 34.5 06/15/2013 1500   RDW 14.8 06/15/2013 1500   LYMPHSABS 9.4* 05/09/2013 1707   MONOABS 0.9 05/09/2013 1707   EOSABS 0.2 05/09/2013 1707   BASOSABS 0.0 05/09/2013 1707    Wean to extubate when more awake.

## 2013-06-15 NOTE — Anesthesia Procedure Notes (Addendum)
Procedure Name: Intubation Date/Time: 06/15/2013 9:00 AM Performed by: Charm Barges, Casara Perrier R Pre-anesthesia Checklist: Patient identified, Emergency Drugs available, Suction available, Patient being monitored and Timeout performed Patient Re-evaluated:Patient Re-evaluated prior to inductionOxygen Delivery Method: Circle system utilized Preoxygenation: Pre-oxygenation with 100% oxygen Intubation Type: IV induction Ventilation: Mask ventilation without difficulty Laryngoscope Size: Mac and 3 Grade View: Grade II Tube type: Oral Tube size: 8.0 mm Number of attempts: 1 Airway Equipment and Method: Stylet Placement Confirmation: ETT inserted through vocal cords under direct vision and positive ETCO2 Secured at: 23 cm Tube secured with: Tape Dental Injury: Teeth and Oropharynx as per pre-operative assessment     PA catheter:  Routine monitors. Timeout, sterile prep, drape, FBP R neck.  Trendelenburg position.  1% Lido local, finder and trocar RIJ 1st pass with US guidance.  Cordis placed over J wire. PA catheter in easily.  Sterile dressing applied.  Patient tolerated well, VSS.  Sandford Craze, MD [phone]

## 2013-06-15 NOTE — Brief Op Note (Addendum)
      [address]       [name] [address]             [phone]     06/15/2013  12:58 PM  PATIENT:  Joanna [name]  79 y.o. female  PRE-OPERATIVE DIAGNOSIS:  AS MR  POST-OPERATIVE DIAGNOSIS:  Aortic stenosis, mitral regurgitation  PROCEDURE:  Procedure(s): AORTIC VALVE REPLACEMENT (AVR)# 21 MAGNA EASE BIOPROSTHETIC INTRAOPERATIVE TRANSESOPHAGEAL ECHOCARDIOGRAM MITRAL VALVE (MV) REPLACEMENT# 25 MAGNA MITRAL EASE BIOPROSTHETIC EPICARDIAL PACING LEAD PLACEMENT X2  SURGEON:    Purcell Nails, MD  ASSISTANTS:  Rowe Clack, PA-C  ANESTHESIA:   Raiford Simmonds, MD  CROSSCLAMP TIME:   144'  CARDIOPULMONARY BYPASS TIME: 169'  FINDINGS:  Severe calcific aortic stenosis  Type IIIa mitral valve dysfunction due to severe mitral annular and leaflet calcification  Moderate mitral regurgitation  Moderate LV systolic dysfunction  Moderate LV hypertrophy  COMPLICATIONS: none  PRE-OPERATIVE WEIGHT: 60kg  PATIENT DISPOSITION:   TO SICU IN STABLE CONDITION  OWEN,CLARENCE H 06/15/2013 2:16 PM

## 2013-06-15 NOTE — Preoperative (Addendum)
Beta Blockers   Reason not to administer Beta Blockers:Carvedilol taken 06/15/13 at 0600hrs

## 2013-06-15 NOTE — OR Nursing (Signed)
Metal solid band cut from left ring finger. Ring transported by Carol Ada, RNFA to pt's daughter [name] prior to surgery start.

## 2013-06-15 NOTE — Anesthesia Preprocedure Evaluation (Signed)
Anesthesia Evaluation  Patient identified by MRN, date of birth, ID band Patient awake    Reviewed: Allergy & Precautions, H&P , NPO status , Patient's Chart, lab work & pertinent test results  History of Anesthesia Complications (+) PONV  Airway Mallampati: II  Neck ROM: full    Dental   Pulmonary shortness of breath,          Cardiovascular hypertension, + Peripheral Vascular Disease and +CHF + Valvular Problems/Murmurs AS and MR     Neuro/Psych CVA    GI/Hepatic   Endo/Other  diabetes, Type 2  Renal/GU      Musculoskeletal  (+) Arthritis -,   Abdominal   Peds  Hematology   Anesthesia Other Findings   Reproductive/Obstetrics                           Anesthesia Physical Anesthesia Plan  ASA: III  Anesthesia Plan: General   Post-op Pain Management:    Induction: Intravenous  Airway Management Planned: Oral ETT  Additional Equipment: Arterial line, CVP, PA Cath, TEE and Ultrasound Guidance Line Placement  Intra-op Plan:   Post-operative Plan: Post-operative intubation/ventilation  Informed Consent: I have reviewed the patients History and Physical, chart, labs and discussed the procedure including the risks, benefits and alternatives for the proposed anesthesia with the patient or authorized representative who has indicated his/her understanding and acceptance.     Plan Discussed with: CRNA, Anesthesiologist and Surgeon  Anesthesia Plan Comments:         Anesthesia Quick Evaluation

## 2013-06-16 ENCOUNTER — Inpatient Hospital Stay (HOSPITAL_COMMUNITY): Payer: Medicare Other

## 2013-06-16 ENCOUNTER — Other Ambulatory Visit: Payer: Self-pay

## 2013-06-16 ENCOUNTER — Encounter (HOSPITAL_COMMUNITY): Payer: Self-pay | Admitting: *Deleted

## 2013-06-16 LAB — GLUCOSE, CAPILLARY
Glucose-Capillary: 86 mg/dL (ref 70–99)
Glucose-Capillary: 105 mg/dL — ABNORMAL HIGH (ref 70–99)
Glucose-Capillary: 134 mg/dL — ABNORMAL HIGH (ref 70–99)
Glucose-Capillary: 147 mg/dL — ABNORMAL HIGH (ref 70–99)
Glucose-Capillary: 105 mg/dL — ABNORMAL HIGH (ref 70–99)
Glucose-Capillary: 84 mg/dL (ref 70–99)
Glucose-Capillary: 86 mg/dL (ref 70–99)
Glucose-Capillary: 106 mg/dL — ABNORMAL HIGH (ref 70–99)
Glucose-Capillary: 116 mg/dL — ABNORMAL HIGH (ref 70–99)
Glucose-Capillary: 109 mg/dL — ABNORMAL HIGH (ref 70–99)
Glucose-Capillary: 106 mg/dL — ABNORMAL HIGH (ref 70–99)
Glucose-Capillary: 114 mg/dL — ABNORMAL HIGH (ref 70–99)

## 2013-06-16 LAB — POCT I-STAT, CHEM 8
BUN: 30 mg/dL — ABNORMAL HIGH (ref 6–23)
Chloride: 109 mEq/L (ref 96–112)
Creatinine, Ser: 1.2 mg/dL — ABNORMAL HIGH (ref 0.50–1.10)
Sodium: 142 mEq/L (ref 135–145)
TCO2: 20 mmol/L (ref 0–100)

## 2013-06-16 LAB — POCT I-STAT 3, ART BLOOD GAS (G3+)
Acid-base deficit: 2 mmol/L (ref 0.0–2.0)
Bicarbonate: 22.5 mEq/L (ref 20.0–24.0)
Bicarbonate: 24.8 mEq/L — ABNORMAL HIGH (ref 20.0–24.0)
O2 Saturation: 97 %
O2 Saturation: 99 %
O2 Saturation: 99 %
Patient temperature: 36.9
Patient temperature: 37.09
Patient temperature: 98.6
TCO2: 23 mmol/L (ref 0–100)
TCO2: 26 mmol/L (ref 0–100)
TCO2: 26 mmol/L (ref 0–100)
pCO2 arterial: 48.4 mmHg — ABNORMAL HIGH (ref 35.0–45.0)
pO2, Arterial: 109 mmHg — ABNORMAL HIGH (ref 80.0–100.0)

## 2013-06-16 LAB — CBC
HCT: 26.1 % — ABNORMAL LOW (ref 36.0–46.0)
HCT: 26.3 % — ABNORMAL LOW (ref 36.0–46.0)
Hemoglobin: 9 g/dL — ABNORMAL LOW (ref 12.0–15.0)
Hemoglobin: 8.7 g/dL — ABNORMAL LOW (ref 12.0–15.0)
MCV: 92.9 fL (ref 78.0–100.0)
RBC: 2.89 MIL/uL — ABNORMAL LOW (ref 3.87–5.11)
RDW: 15.8 % — ABNORMAL HIGH (ref 11.5–15.5)
WBC: 10.8 10*3/uL — ABNORMAL HIGH (ref 4.0–10.5)

## 2013-06-16 LAB — BASIC METABOLIC PANEL
CO2: 23 mEq/L (ref 19–32)
Glucose, Bld: 109 mg/dL — ABNORMAL HIGH (ref 70–99)
Potassium: 3.7 mEq/L (ref 3.5–5.1)
Sodium: 143 mEq/L (ref 135–145)

## 2013-06-16 LAB — MAGNESIUM: Magnesium: 2.8 mg/dL — ABNORMAL HIGH (ref 1.5–2.5)

## 2013-06-16 LAB — CREATININE, SERUM
GFR calc Af Amer: 55 mL/min — ABNORMAL LOW (ref >90)
GFR calc non Af Amer: 48 mL/min — ABNORMAL LOW (ref >90)

## 2013-06-16 MED ORDER — LACTATED RINGERS IV SOLN: INTRAVENOUS | Status: DC

## 2013-06-16 MED ORDER — CARVEDILOL 6.25 MG PO TABS
6.2500 mg | ORAL_TABLET | Freq: Two times a day (BID) | ORAL | Status: DC
  Administered 2013-06-16 – 2013-06-19 (×7): 6.25 mg via ORAL
  Filled 2013-06-16 (×9): qty 1

## 2013-06-16 MED ORDER — MORPHINE SULFATE 2 MG/ML IJ SOLN
1.0000 mg | INTRAMUSCULAR | Status: DC | PRN
  Filled 2013-06-16: qty 1

## 2013-06-16 MED ORDER — METOCLOPRAMIDE HCL 5 MG/ML IJ SOLN
10.0000 mg | Freq: Four times a day (QID) | INTRAMUSCULAR | Status: AC
  Administered 2013-06-16 – 2013-06-17 (×4): 10 mg via INTRAVENOUS
  Filled 2013-06-16 (×4): qty 2

## 2013-06-16 MED ORDER — KETOROLAC TROMETHAMINE 15 MG/ML IJ SOLN
15.0000 mg | Freq: Once | INTRAMUSCULAR | Status: AC
  Administered 2013-06-16: 15 mg via INTRAVENOUS
  Filled 2013-06-16: qty 1

## 2013-06-16 MED ORDER — INSULIN ASPART 100 UNIT/ML ~~LOC~~ SOLN
0.0000 [IU] | SUBCUTANEOUS | Status: DC
  Administered 2013-06-16 – 2013-06-17 (×3): 2 [IU] via SUBCUTANEOUS
  Administered 2013-06-17: 4 [IU] via SUBCUTANEOUS

## 2013-06-16 MED ORDER — TRAMADOL HCL 50 MG PO TABS: 100.0000 mg | ORAL_TABLET | Freq: Four times a day (QID) | ORAL | Status: DC | PRN

## 2013-06-16 MED ORDER — DEXTROSE 50 % IV SOLN
INTRAVENOUS | Status: AC
  Filled 2013-06-16: qty 50

## 2013-06-16 MED ORDER — TRAMADOL HCL 50 MG PO TABS
50.0000 mg | ORAL_TABLET | Freq: Four times a day (QID) | ORAL | Status: DC | PRN
  Administered 2013-06-16: 50 mg via ORAL
  Administered 2013-06-17: 100 mg via ORAL
  Administered 2013-06-17 (×2): 50 mg via ORAL
  Administered 2013-06-17: 100 mg via ORAL
  Administered 2013-06-18: 50 mg via ORAL
  Filled 2013-06-16 (×3): qty 1
  Filled 2013-06-16 (×2): qty 2
  Filled 2013-06-16: qty 1

## 2013-06-16 MED ORDER — FUROSEMIDE 10 MG/ML IJ SOLN
20.0000 mg | Freq: Four times a day (QID) | INTRAMUSCULAR | Status: AC
  Administered 2013-06-16 (×3): 20 mg via INTRAVENOUS
  Filled 2013-06-16 (×3): qty 2

## 2013-06-16 MED ORDER — POTASSIUM CHLORIDE 10 MEQ/50ML IV SOLN
10.0000 meq | INTRAVENOUS | Status: AC | PRN
  Administered 2013-06-16 (×3): 10 meq via INTRAVENOUS
  Filled 2013-06-16: qty 50

## 2013-06-16 MED FILL — Heparin Sodium (Porcine) Inj 1000 Unit/ML: INTRAMUSCULAR | Qty: 10 | Status: AC

## 2013-06-16 MED FILL — Potassium Chloride Inj 2 mEq/ML: INTRAVENOUS | Qty: 40 | Status: AC

## 2013-06-16 MED FILL — Sodium Bicarbonate IV Soln 8.4%: INTRAVENOUS | Qty: 50 | Status: AC

## 2013-06-16 MED FILL — Electrolyte-R (PH 7.4) Solution: INTRAVENOUS | Qty: 3000 | Status: AC

## 2013-06-16 MED FILL — Lidocaine HCl IV Inj 20 MG/ML: INTRAVENOUS | Qty: 5 | Status: AC

## 2013-06-16 MED FILL — Sodium Chloride Irrigation Soln 0.9%: Qty: 3000 | Status: AC

## 2013-06-16 MED FILL — Sodium Chloride IV Soln 0.9%: INTRAVENOUS | Qty: 1000 | Status: AC

## 2013-06-16 MED FILL — Heparin Sodium (Porcine) Inj 1000 Unit/ML: INTRAMUSCULAR | Qty: 30 | Status: AC

## 2013-06-16 MED FILL — Magnesium Sulfate Inj 50%: INTRAMUSCULAR | Qty: 10 | Status: AC

## 2013-06-16 MED FILL — Mannitol IV Soln 20%: INTRAVENOUS | Qty: 500 | Status: AC

## 2013-06-16 NOTE — Progress Notes (Signed)
Utilization Review Completed.  

## 2013-06-16 NOTE — Progress Notes (Signed)
[facility]  Patient examined and record reviewed.Hemodynamics stable,labs satisfactory.Patient had stable day.Continue current care. Walk outside in the [place] now back to bed resting comfortably VAN TRIGT III,PETER 06/16/2013

## 2013-06-16 NOTE — Progress Notes (Signed)
      [address]       [name] [address]             [phone]        CARDIOTHORACIC SURGERY PROGRESS NOTE   R1 Day Post-Op Procedure(s) (LRB): AORTIC VALVE REPLACEMENT (AVR) (N/A) INTRAOPERATIVE TRANSESOPHAGEAL ECHOCARDIOGRAM (N/A) MITRAL VALVE (MV) REPLACEMENT (N/A) EPICARDIAL PACING LEAD PLACEMENT (N/A)  Subjective: Slow to wake up overnight.  Extubated this morning.  Still groggy but follows commands.  Reports some pain in back.  Objective: Vital signs: BP Readings from Last 1 Encounters:  06/16/13 170/56   Pulse Readings from Last 1 Encounters:  06/16/13 70   Resp Readings from Last 1 Encounters:  06/16/13 25   Temp Readings from Last 1 Encounters:  06/16/13 97.9 F (36.6 C)     Hemodynamics: PAP: (21-43)/(8-21) 41/18 mmHg CO:  [2.5 L/min-5.2 L/min] 3.6 L/min CI:  [1.5 L/min/m2-3.1 L/min/m2] 2.1 L/min/m2  Physical Exam:  Rhythm:   Sinus 50's - AAI paced  Breath sounds: Fairly clear  Heart sounds:  RRR w/out murmur  Incisions:  Dressings dry  Abdomen:  Soft, non-distended, non-tender  Extremities:  Warm, well-perfused    Intake/Output from previous day: 08/27 0701 - 08/28 0700 In: 6339.8 [I.V.:4259.8; Blood:870; NG/GT:60; IV Piggyback:1150] Out: 5175 [Urine:2710; Emesis/NG output:100; Blood:1685; Chest Tube:680] Intake/Output this shift:    Lab Results:  Recent Labs  06/15/13 2100 06/15/13 2119 06/16/13 0500  WBC 16.6*  --  10.0  HGB 10.7* 10.2* 9.0*  HCT 30.7* 30.0* 26.1*  PLT 140*  --  89*   BMET:  Recent Labs  06/13/13 1351  06/15/13 2119 06/16/13 0500  NA 140  < > 141 143  K 3.6  < > 4.9 3.7  CL 98  --  109 111  CO2 30  --   --  23  GLUCOSE 123*  < > 143* 109*  BUN 36*  --  27* 28*  CREATININE 1.28*  < > 1.00 0.88  CALCIUM 10.2  --   --  8.5  < > = values in this interval not displayed.  CBG (last 3)   Recent Labs  06/16/13 0349 06/16/13 0506 06/16/13 0635  GLUCAP 119* 105* 84   ABG      Component Value Date/Time   PHART 7.306* 06/16/2013 0215   PCO2ART 48.4* 06/16/2013 0215   PO2ART 97.0 06/16/2013 0215   HCO3 24.2* 06/16/2013 0215   TCO2 26 06/16/2013 0215   ACIDBASEDEF 2.0 06/16/2013 0215   O2SAT 97.0 06/16/2013 0215   CXR: Looks good.  Mild congestion and bibasilar atelectasis  Assessment/Plan: S/P Procedure(s) (LRB): AORTIC VALVE REPLACEMENT (AVR) (N/A) INTRAOPERATIVE TRANSESOPHAGEAL ECHOCARDIOGRAM (N/A) MITRAL VALVE (MV) REPLACEMENT (N/A) EPICARDIAL PACING LEAD PLACEMENT (N/A)  Overall doing well POD1 Maintaining NSR w/ stable hemodynamics Expected post op acute blood loss anemia, mild, stable Expected post op volume excess, mild Post op thrombocytopenia, moderate Expected post op atelectasis, mild   Mobilize  Diuresis   D/C tubes and lines  Minimize narcotics and sedation   Joanna [name] H 06/16/2013 7:58 AM

## 2013-06-16 NOTE — Procedures (Signed)
Extubation Procedure Note  Patient Details:   Name: Joanna [name] DOB: 1930-10-12 MRN: [id number]   Airway Documentation:     Evaluation  O2 sats: stable throughout Complications: No apparent complications Patient did tolerate procedure well. Bilateral Breath Sounds: Clear Suctioning: Airway Yes Patient extubated to 4lpm nasal cannula with Sp02=97%. No stridor heard in upper airway. NIF=-20 with VC=.75-.80  Leafy Half 06/16/2013, 1:18 AM

## 2013-06-17 ENCOUNTER — Encounter (HOSPITAL_COMMUNITY): Payer: Self-pay | Admitting: Thoracic Surgery (Cardiothoracic Vascular Surgery)

## 2013-06-17 ENCOUNTER — Inpatient Hospital Stay (HOSPITAL_COMMUNITY): Payer: Medicare Other

## 2013-06-17 LAB — GLUCOSE, CAPILLARY
Glucose-Capillary: 138 mg/dL — ABNORMAL HIGH (ref 70–99)
Glucose-Capillary: 156 mg/dL — ABNORMAL HIGH (ref 70–99)
Glucose-Capillary: 164 mg/dL — ABNORMAL HIGH (ref 70–99)
Glucose-Capillary: 165 mg/dL — ABNORMAL HIGH (ref 70–99)
Glucose-Capillary: 175 mg/dL — ABNORMAL HIGH (ref 70–99)
Glucose-Capillary: 184 mg/dL — ABNORMAL HIGH (ref 70–99)

## 2013-06-17 LAB — CBC
Hemoglobin: 8.3 g/dL — ABNORMAL LOW (ref 12.0–15.0)
MCH: 32.2 pg (ref 26.0–34.0)
MCV: 93 fL (ref 78.0–100.0)
Platelets: 67 10*3/uL — ABNORMAL LOW (ref 150–400)
RBC: 2.58 MIL/uL — ABNORMAL LOW (ref 3.87–5.11)
WBC: 9.4 10*3/uL (ref 4.0–10.5)

## 2013-06-17 LAB — BASIC METABOLIC PANEL
CO2: 20 mEq/L (ref 19–32)
Calcium: 8.8 mg/dL (ref 8.4–10.5)
GFR calc non Af Amer: 41 mL/min — ABNORMAL LOW (ref >90)
Glucose, Bld: 136 mg/dL — ABNORMAL HIGH (ref 70–99)
Potassium: 3.8 mEq/L (ref 3.5–5.1)
Sodium: 140 mEq/L (ref 135–145)

## 2013-06-17 MED ORDER — AMIODARONE HCL IN DEXTROSE 360-4.14 MG/200ML-% IV SOLN
INTRAVENOUS | Status: AC
  Administered 2013-06-17: 60 mg/h via INTRAVENOUS
  Filled 2013-06-17: qty 200

## 2013-06-17 MED ORDER — SODIUM CHLORIDE 0.9 % IV SOLN: 250.0000 mL | INTRAVENOUS | Status: DC | PRN

## 2013-06-17 MED ORDER — POTASSIUM CHLORIDE 10 MEQ/50ML IV SOLN
10.0000 meq | INTRAVENOUS | Status: AC
  Administered 2013-06-17: 10 meq via INTRAVENOUS

## 2013-06-17 MED ORDER — AMIODARONE HCL IN DEXTROSE 360-4.14 MG/200ML-% IV SOLN
60.0000 mg/h | INTRAVENOUS | Status: AC
  Administered 2013-06-17 (×2): 60 mg/h via INTRAVENOUS
  Filled 2013-06-17: qty 200

## 2013-06-17 MED ORDER — FUROSEMIDE 40 MG PO TABS
40.0000 mg | ORAL_TABLET | Freq: Every day | ORAL | Status: AC
  Administered 2013-06-18: 40 mg via ORAL
  Filled 2013-06-17: qty 1

## 2013-06-17 MED ORDER — INSULIN ASPART 100 UNIT/ML ~~LOC~~ SOLN
0.0000 [IU] | Freq: Three times a day (TID) | SUBCUTANEOUS | Status: DC
  Administered 2013-06-17 – 2013-06-19 (×8): 4 [IU] via SUBCUTANEOUS
  Administered 2013-06-19 (×2): 2 [IU] via SUBCUTANEOUS
  Administered 2013-06-19 – 2013-06-20 (×2): 4 [IU] via SUBCUTANEOUS
  Administered 2013-06-20 (×3): 2 [IU] via SUBCUTANEOUS
  Administered 2013-06-21 (×2): 4 [IU] via SUBCUTANEOUS
  Administered 2013-06-21 – 2013-06-22 (×4): 2 [IU] via SUBCUTANEOUS
  Administered 2013-06-22: 4 [IU] via SUBCUTANEOUS
  Administered 2013-06-23: 8 [IU] via SUBCUTANEOUS
  Administered 2013-06-23 – 2013-06-24 (×2): 4 [IU] via SUBCUTANEOUS
  Administered 2013-06-24 – 2013-06-25 (×5): 2 [IU] via SUBCUTANEOUS
  Administered 2013-06-25: 4 [IU] via SUBCUTANEOUS
  Administered 2013-06-25: 2 [IU] via SUBCUTANEOUS
  Administered 2013-06-26: 4 [IU] via SUBCUTANEOUS
  Administered 2013-06-26: 2 [IU] via SUBCUTANEOUS
  Administered 2013-06-26: 4 [IU] via SUBCUTANEOUS
  Administered 2013-06-26 – 2013-06-27 (×3): 2 [IU] via SUBCUTANEOUS

## 2013-06-17 MED ORDER — ASPIRIN EC 81 MG PO TBEC
81.0000 mg | DELAYED_RELEASE_TABLET | Freq: Every day | ORAL | Status: DC
  Administered 2013-06-17 – 2013-06-27 (×11): 81 mg via ORAL
  Filled 2013-06-17 (×11): qty 1

## 2013-06-17 MED ORDER — POTASSIUM CHLORIDE CRYS ER 20 MEQ PO TBCR
20.0000 meq | EXTENDED_RELEASE_TABLET | Freq: Every day | ORAL | Status: DC
  Administered 2013-06-18 – 2013-06-21 (×4): 20 meq via ORAL
  Filled 2013-06-17 (×6): qty 1

## 2013-06-17 MED ORDER — FUROSEMIDE 10 MG/ML IJ SOLN
INTRAMUSCULAR | Status: AC
  Administered 2013-06-17: 20 mg
  Filled 2013-06-17: qty 2

## 2013-06-17 MED ORDER — AMIODARONE HCL IN DEXTROSE 360-4.14 MG/200ML-% IV SOLN
30.0000 mg/h | INTRAVENOUS | Status: DC
  Administered 2013-06-17 – 2013-06-18 (×2): 30 mg/h via INTRAVENOUS
  Filled 2013-06-17 (×5): qty 200

## 2013-06-17 MED ORDER — WARFARIN - PHYSICIAN DOSING INPATIENT
Freq: Every day | Status: DC
  Administered 2013-06-19 – 2013-06-25 (×4)

## 2013-06-17 MED ORDER — MOVING RIGHT ALONG BOOK
Freq: Once | Status: AC
  Administered 2013-06-17: 13:00:00
  Filled 2013-06-17: qty 1

## 2013-06-17 MED ORDER — SODIUM CHLORIDE 0.9 % IJ SOLN
3.0000 mL | INTRAMUSCULAR | Status: DC | PRN
  Administered 2013-06-17 (×3): 3 mL via INTRAVENOUS

## 2013-06-17 MED ORDER — SODIUM CHLORIDE 0.9 % IJ SOLN
3.0000 mL | Freq: Two times a day (BID) | INTRAMUSCULAR | Status: DC
  Administered 2013-06-17 – 2013-06-26 (×14): 3 mL via INTRAVENOUS

## 2013-06-17 MED ORDER — POTASSIUM CHLORIDE 10 MEQ/50ML IV SOLN
10.0000 meq | INTRAVENOUS | Status: AC
  Administered 2013-06-17 (×2): 10 meq via INTRAVENOUS

## 2013-06-17 MED ORDER — POTASSIUM CHLORIDE 10 MEQ/50ML IV SOLN
INTRAVENOUS | Status: AC
  Administered 2013-06-17: 10 meq via INTRAVENOUS
  Filled 2013-06-17: qty 100

## 2013-06-17 MED ORDER — FUROSEMIDE 10 MG/ML IJ SOLN
20.0000 mg | Freq: Four times a day (QID) | INTRAMUSCULAR | Status: AC
  Administered 2013-06-17 (×2): 20 mg via INTRAVENOUS
  Filled 2013-06-17 (×2): qty 2

## 2013-06-17 MED ORDER — ATORVASTATIN CALCIUM 40 MG PO TABS: 40.0000 mg | ORAL_TABLET | Freq: Every day | ORAL | Status: DC

## 2013-06-17 MED ORDER — WARFARIN SODIUM 2 MG PO TABS
2.0000 mg | ORAL_TABLET | Freq: Every day | ORAL | Status: DC
  Administered 2013-06-17 – 2013-06-19 (×3): 2 mg via ORAL
  Filled 2013-06-17 (×4): qty 1

## 2013-06-17 MED ORDER — AMIODARONE LOAD VIA INFUSION
150.0000 mg | Freq: Once | INTRAVENOUS | Status: AC
  Administered 2013-06-17: 150 mg via INTRAVENOUS
  Filled 2013-06-17: qty 83.34

## 2013-06-17 MED ORDER — MAGNESIUM HYDROXIDE 400 MG/5ML PO SUSP: 30.0000 mL | Freq: Every day | ORAL | Status: DC | PRN

## 2013-06-17 NOTE — Plan of Care (Signed)
Problem: Phase III Progression Outcomes Goal: Advance to regular diet without nausea Outcome: Progressing Eating a few bites

## 2013-06-17 NOTE — Progress Notes (Signed)
  Amiodarone Drug - Drug Interaction Consult Note  Recommendations:  Amiodarone is metabolized by the cytochrome P450 system and therefore has the potential to cause many drug interactions. Amiodarone has an average plasma half-life of 50 days (range 20 to 100 days).   There is potential for drug interactions to occur several weeks or months after stopping treatment and the onset of drug interactions may be slow after initiating amiodarone.   []  Statins: Increased risk of myopathy. Simvastatin- restrict dose to 20mg  daily. Other statins: counsel patients to report any muscle pain or weakness immediately.  []  Anticoagulants: Amiodarone can increase anticoagulant effect. Consider warfarin dose reduction. Patients should be monitored closely and the dose of anticoagulant altered accordingly, remembering that amiodarone levels take several weeks to stabilize.  []  Antiepileptics: Amiodarone can increase plasma concentration of phenytoin, the dose should be reduced. Note that small changes in phenytoin dose can result in large changes in levels. Monitor patient and counsel on signs of toxicity.  []  Beta blockers: increased risk of bradycardia, AV block and myocardial depression. Sotalol - avoid concomitant use.  []   Calcium channel blockers (diltiazem and verapamil): increased risk of bradycardia, AV block and myocardial depression.  []   Cyclosporine: Amiodarone increases levels of cyclosporine. Reduced dose of cyclosporine is recommended.  []  Digoxin dose should be halved when amiodarone is started.  []  Diuretics: increased risk of cardiotoxicity if hypokalemia occurs.  []  Oral hypoglycemic agents (glyburide, glipizide, glimepiride): increased risk of hypoglycemia. Patient's glucose levels should be monitored closely when initiating amiodarone therapy.   []  Drugs that prolong the QT interval:  Torsades de pointes risk may be increased with concurrent use - avoid if possible.  Monitor QTc, also  keep magnesium/potassium WNL if concurrent therapy can't be avoided. Marland Kitchen Antibiotics: e.g. fluoroquinolones, erythromycin. . Antiarrhythmics: e.g. quinidine, procainamide, disopyramide, sotalol. . Antipsychotics: e.g. phenothiazines, haloperidol.  . Lithium, tricyclic antidepressants, and methadone.  Thank You,  Vernard Gambles, PharmD, BCPS   06/17/2013 6:08 AM

## 2013-06-17 NOTE — Progress Notes (Signed)
      [address]       [name] [address]             [phone]        CARDIOTHORACIC SURGERY PROGRESS NOTE   R2 Days Post-Op Procedure(s) (LRB): AORTIC VALVE REPLACEMENT (AVR) (N/A) INTRAOPERATIVE TRANSESOPHAGEAL ECHOCARDIOGRAM (N/A) MITRAL VALVE (MV) REPLACEMENT (N/A) EPICARDIAL PACING LEAD PLACEMENT (N/A)  Subjective: Went into rapid Afib overnight, now back in sinus/AAI paced rhythm on IV amiodarone.  Reports feeling better.  Ambulated some w/ assistance.  Objective: Vital signs: BP Readings from Last 1 Encounters:  06/17/13 132/50   Pulse Readings from Last 1 Encounters:  06/17/13 70   Resp Readings from Last 1 Encounters:  06/17/13 16   Temp Readings from Last 1 Encounters:  06/17/13 98.1 F (36.7 C) Oral    Hemodynamics: PAP: (37-48)/(17-26) 48/26 mmHg  Physical Exam:  Rhythm:   Sinus - AAI paced  Breath sounds: Diminished at bases  Heart sounds:  RRR w/out murmur  Incisions:  Dressings dry  Abdomen:  Soft, non-distended, non-tender  Extremities:  Warm, well-perfused    Intake/Output from previous day: 08/28 0701 - 08/29 0700 In: 1266.3 [I.V.:966.3; IV Piggyback:300] Out: 1405 [Urine:1275; Chest Tube:130] Intake/Output this shift: Total I/O In: 53.3 [I.V.:53.3] Out: 35 [Urine:35]  Lab Results:  Recent Labs  06/16/13 1745 06/16/13 1802 06/17/13 0435  WBC 10.8*  --  9.4  HGB 8.7* 8.2* 8.3*  HCT 26.3* 24.0* 24.0*  PLT 73*  --  67*   BMET:  Recent Labs  06/16/13 0500  06/16/13 1802 06/17/13 0435  NA 143  --  142 140  K 3.7  --  4.4 3.8  CL 111  --  109 106  CO2 23  --   --  20  GLUCOSE 109*  --  163* 136*  BUN 28*  --  30* 35*  CREATININE 0.88  < > 1.20* 1.21*  CALCIUM 8.5  --   --  8.8  < > = values in this interval not displayed.  CBG (last 3)   Recent Labs  06/16/13 2033 06/17/13 0012 06/17/13 0423  GLUCAP 152* 156* 138*   ABG    Component Value Date/Time   PHART 7.306* 06/16/2013 0215   PCO2ART  48.4* 06/16/2013 0215   PO2ART 97.0 06/16/2013 0215   HCO3 24.2* 06/16/2013 0215   TCO2 20 06/16/2013 1802   ACIDBASEDEF 2.0 06/16/2013 0215   O2SAT 97.0 06/16/2013 0215   CXR: Increased mild bibasilar atelectasis  Assessment/Plan: S/P Procedure(s) (LRB): AORTIC VALVE REPLACEMENT (AVR) (N/A) INTRAOPERATIVE TRANSESOPHAGEAL ECHOCARDIOGRAM (N/A) MITRAL VALVE (MV) REPLACEMENT (N/A) EPICARDIAL PACING LEAD PLACEMENT (N/A)  Overall stable POD2 Post-op Afib, currently NSR on IV amiodarone Expected post op acute blood loss anemia, mild, stable Expected post op volume excess, diuresing Expected post op atelectasis, mild-moderate Post op thrombocytopenia, stable Type II diabetes mellitus, good glycemic control on sliding scale   Continue amiodarone IV today  Mobilize  Diuresis  Pulm toilet  Start coumadin  Resume oral agents for DMII as oral intake and CBG's increase    Joanna [name] 06/17/2013 8:24 AM

## 2013-06-17 NOTE — Progress Notes (Signed)
TCTS BRIEF SICU PROGRESS NOTE  2 Days Post-Op  S/P Procedure(s) (LRB): AORTIC VALVE REPLACEMENT (AVR) (N/A) INTRAOPERATIVE TRANSESOPHAGEAL ECHOCARDIOGRAM (N/A) MITRAL VALVE (MV) REPLACEMENT (N/A) EPICARDIAL PACING LEAD PLACEMENT (N/A)   Stable day Maintaining AAI paced rhythm w/ stable BP UOP adequate  Plan: Continue current plan  Purcell Nails 06/17/2013 6:56 PM

## 2013-06-17 NOTE — Progress Notes (Signed)
Afib RVR rate 120-170 sustained.  Lopressor 5mg  IV given and pt converted to SR for approx 30 seconds and returned to Afib.  Dr Donata Clay called and Amiodarone infusion started with 150mg  bolus. Pt being paced AAI @ 60 backup.  BP stable and will continue to monitor.   L Jaquann Guarisco RN

## 2013-06-17 NOTE — Plan of Care (Signed)
Problem: Phase III Progression Outcomes Goal: Ambulates with pain/dyspnea controlled Outcome: Progressing Ambulated 100 ft on 2 L O2 with 1 short rest. Goal: Advance to regular diet without nausea Carb modified but very small amounts

## 2013-06-18 LAB — BASIC METABOLIC PANEL
Chloride: 100 mEq/L (ref 96–112)
GFR calc Af Amer: 49 mL/min — ABNORMAL LOW (ref >90)
GFR calc non Af Amer: 42 mL/min — ABNORMAL LOW (ref >90)
Glucose, Bld: 190 mg/dL — ABNORMAL HIGH (ref 70–99)
Potassium: 3.6 mEq/L (ref 3.5–5.1)
Sodium: 136 mEq/L (ref 135–145)

## 2013-06-18 LAB — CBC
HCT: 25.1 % — ABNORMAL LOW (ref 36.0–46.0)
Hemoglobin: 8.5 g/dL — ABNORMAL LOW (ref 12.0–15.0)
MCHC: 33.9 g/dL (ref 30.0–36.0)
WBC: 10 10*3/uL (ref 4.0–10.5)

## 2013-06-18 LAB — GLUCOSE, CAPILLARY
Glucose-Capillary: 164 mg/dL — ABNORMAL HIGH (ref 70–99)
Glucose-Capillary: 179 mg/dL — ABNORMAL HIGH (ref 70–99)

## 2013-06-18 LAB — PROTIME-INR: INR: 1.17 (ref 0.00–1.49)

## 2013-06-18 MED ORDER — LOSARTAN POTASSIUM 50 MG PO TABS
100.0000 mg | ORAL_TABLET | Freq: Every day | ORAL | Status: DC
  Administered 2013-06-18 – 2013-06-27 (×9): 100 mg via ORAL
  Filled 2013-06-18 (×10): qty 2

## 2013-06-18 MED ORDER — FUROSEMIDE 40 MG PO TABS
60.0000 mg | ORAL_TABLET | Freq: Two times a day (BID) | ORAL | Status: DC
  Administered 2013-06-18 – 2013-06-23 (×10): 60 mg via ORAL
  Filled 2013-06-18 (×12): qty 1

## 2013-06-18 MED ORDER — FERROUS SULFATE 325 (65 FE) MG PO TABS
325.0000 mg | ORAL_TABLET | Freq: Two times a day (BID) | ORAL | Status: DC
  Administered 2013-06-19 – 2013-06-27 (×17): 325 mg via ORAL
  Filled 2013-06-18 (×20): qty 1

## 2013-06-18 MED ORDER — DOCUSATE SODIUM 100 MG PO CAPS
100.0000 mg | ORAL_CAPSULE | Freq: Two times a day (BID) | ORAL | Status: DC
  Administered 2013-06-18 – 2013-06-27 (×14): 100 mg via ORAL
  Filled 2013-06-18 (×19): qty 1

## 2013-06-18 MED ORDER — AMIODARONE HCL 200 MG PO TABS: 200.0000 mg | ORAL_TABLET | Freq: Two times a day (BID) | ORAL | Status: DC

## 2013-06-18 MED ORDER — METFORMIN HCL 500 MG PO TABS
1000.0000 mg | ORAL_TABLET | Freq: Two times a day (BID) | ORAL | Status: DC
  Administered 2013-06-18 – 2013-06-27 (×18): 1000 mg via ORAL
  Filled 2013-06-18 (×21): qty 2

## 2013-06-18 MED ORDER — POTASSIUM CHLORIDE 10 MEQ/50ML IV SOLN
10.0000 meq | INTRAVENOUS | Status: AC
  Administered 2013-06-18 (×3): 10 meq via INTRAVENOUS

## 2013-06-18 MED ORDER — AMIODARONE HCL 200 MG PO TABS
200.0000 mg | ORAL_TABLET | Freq: Two times a day (BID) | ORAL | Status: DC
  Administered 2013-06-18 – 2013-06-23 (×11): 200 mg via ORAL
  Filled 2013-06-18 (×13): qty 1

## 2013-06-18 NOTE — Progress Notes (Signed)
Pt transferred to 2W24 via wheelchair on portable tele monitor. Receiving RN at bedside. Pt placed on tele monitor in room. Transported on [facility] and placed on [facility] on arrival. Tolerated transfer well. Daughter at bedside. Pt belongings sent with pt. Koren Bound

## 2013-06-18 NOTE — Progress Notes (Addendum)
      [address]       [name] [address]             [phone]        CARDIOTHORACIC SURGERY PROGRESS NOTE   R3 Days Post-Op Procedure(s) (LRB): AORTIC VALVE REPLACEMENT (AVR) (N/A) INTRAOPERATIVE TRANSESOPHAGEAL ECHOCARDIOGRAM (N/A) MITRAL VALVE (MV) REPLACEMENT (N/A) EPICARDIAL PACING LEAD PLACEMENT (N/A)  Subjective: Looks good.  Ambulated around SICU this morning.  Slept better.  Eating better.  Objective: Vital signs: BP Readings from Last 1 Encounters:  06/18/13 147/53   Pulse Readings from Last 1 Encounters:  06/18/13 68   Resp Readings from Last 1 Encounters:  06/18/13 20   Temp Readings from Last 1 Encounters:  06/18/13 97.7 F (36.5 C) Oral    Hemodynamics:    Physical Exam:  Rhythm:   NSR  Breath sounds: clear  Heart sounds:  RRR  Incisions:  Clean and dry  Abdomen:  Soft, non-distended, non-tender  Extremities:  Warm, well-perfused    Intake/Output from previous day: 08/29 0701 - 08/30 0700 In: 1499 [P.O.:750; I.V.:699; IV Piggyback:50] Out: 1780 [Urine:1780] Intake/Output this shift: Total I/O In: 313.4 [P.O.:240; I.V.:73.4] Out: 175 [Urine:175]  Lab Results:  CBC: Recent Labs  06/17/13 0435 06/18/13 0420  WBC 9.4 10.0  HGB 8.3* 8.5*  HCT 24.0* 25.1*  PLT 67* 72*    BMET:  Recent Labs  06/17/13 0435 06/18/13 0420  NA 140 136  K 3.8 3.6  CL 106 100  CO2 20 20  GLUCOSE 136* 190*  BUN 35* 39*  CREATININE 1.21* 1.19*  CALCIUM 8.8 8.9     CBG (last 3)   Recent Labs  06/17/13 1840 06/17/13 2118 06/18/13 0805  GLUCAP 184* 165* 184*    ABG    Component Value Date/Time   PHART 7.306* 06/16/2013 0215   PCO2ART 48.4* 06/16/2013 0215   PO2ART 97.0 06/16/2013 0215   HCO3 24.2* 06/16/2013 0215   TCO2 20 06/16/2013 1802   ACIDBASEDEF 2.0 06/16/2013 0215   O2SAT 97.0 06/16/2013 0215    CXR: n/a  Assessment/Plan: S/P Procedure(s) (LRB): AORTIC VALVE REPLACEMENT (AVR) (N/A) INTRAOPERATIVE  TRANSESOPHAGEAL ECHOCARDIOGRAM (N/A) MITRAL VALVE (MV) REPLACEMENT (N/A) EPICARDIAL PACING LEAD PLACEMENT (N/A)  Doing well POD3 Maintaining NSR w/ stable BP Expected post op acute blood loss anemia, mild, stable Expected post op volume excess, mild, diuresing Expected post op atelectasis, mild Post-op thrombocytopenia, stable Hypertension, chronic Type II diabetes mellitus, adequate glycemic control but CBG's trending up w/ increased po intake   Mobilize  Diuresis  Convert amiodarone to oral  Restart metformin for DMII  Restart ARB and watch renal function  Watch anemia and thrombocytopenia  Transfer step down    Licet Dunphy H 06/18/2013 11:12 AM

## 2013-06-18 NOTE — Plan of Care (Signed)
Problem: Phase III Progression Outcomes Goal: Transfer to PCTU/Telemetry POD Outcome: Completed/Met Date Met:  06/18/13 06/18/13

## 2013-06-18 NOTE — Progress Notes (Signed)
Dr Cornelius Moras updated pt getting ready to move to new room on 2W (stepdown/cirlce) MD updated pt ambulated prior to pulling out introducer ~1.5 hours ago, pt states feel SOB post ambulation. Updated pt with decrease o2 sat immediate  post ambulation 80s, recovered quickly with deep breathing and O2 sat>90%, currently 96% on 2LNC. Breath sounds remain clear and diminished in bases. HR 70-80s SR. Post ambulation, introducer pulled with pt in bed, pt on bedrest x42min post d/c of line. Occlusive dressing applied. Will continue with transfer of pt. Koren Bound

## 2013-06-19 ENCOUNTER — Inpatient Hospital Stay (HOSPITAL_COMMUNITY): Payer: Medicare Other

## 2013-06-19 DIAGNOSIS — I359 Nonrheumatic aortic valve disorder, unspecified: Secondary | ICD-10-CM

## 2013-06-19 LAB — TYPE AND SCREEN
Antibody Screen: NEGATIVE
Unit division: 0
Unit division: 0
Unit division: 0
Unit division: 0

## 2013-06-19 LAB — GLUCOSE, CAPILLARY
Glucose-Capillary: 170 mg/dL — ABNORMAL HIGH (ref 70–99)
Glucose-Capillary: 157 mg/dL — ABNORMAL HIGH (ref 70–99)

## 2013-06-19 LAB — PROTIME-INR
INR: 1.16 (ref 0.00–1.49)
Prothrombin Time: 14.6 seconds (ref 11.6–15.2)

## 2013-06-19 LAB — BASIC METABOLIC PANEL
CO2: 26 mEq/L (ref 19–32)
Chloride: 102 mEq/L (ref 96–112)
Creatinine, Ser: 0.93 mg/dL (ref 0.50–1.10)
GFR calc Af Amer: 66 mL/min — ABNORMAL LOW (ref >90)
Potassium: 3.7 mEq/L (ref 3.5–5.1)
Sodium: 139 mEq/L (ref 135–145)

## 2013-06-19 LAB — CBC
HCT: 27.7 % — ABNORMAL LOW (ref 36.0–46.0)
RBC: 3.03 MIL/uL — ABNORMAL LOW (ref 3.87–5.11)
RDW: 15.2 % (ref 11.5–15.5)
WBC: 9.4 10*3/uL (ref 4.0–10.5)

## 2013-06-19 MED ORDER — PIOGLITAZONE HCL 45 MG PO TABS
45.0000 mg | ORAL_TABLET | Freq: Every day | ORAL | Status: DC
  Administered 2013-06-19 – 2013-06-20 (×2): 45 mg via ORAL
  Filled 2013-06-19 (×3): qty 1

## 2013-06-19 MED ORDER — CARVEDILOL 12.5 MG PO TABS
12.5000 mg | ORAL_TABLET | Freq: Two times a day (BID) | ORAL | Status: DC
  Administered 2013-06-19 – 2013-06-27 (×16): 12.5 mg via ORAL
  Filled 2013-06-19 (×18): qty 1

## 2013-06-19 NOTE — Progress Notes (Signed)
Pt ambulated 150 feet with rolling walker; pt took 1 rest break and requested to sit down and be pushed back to room; pt c/o weakness in bil legs; O2 sats RA 97%; pt assisted back to room to chair; call bell w/i reach; will cont. To monitor.

## 2013-06-19 NOTE — Progress Notes (Signed)
EPW d/c at this time per MD order; VSS; pt tolerated well; BR until 1420; will cont. To monitor.

## 2013-06-19 NOTE — Progress Notes (Addendum)
       [address]       [place] [address]             [phone]          4 Days Post-Op Procedure(s) (LRB): AORTIC VALVE REPLACEMENT (AVR) (N/A) INTRAOPERATIVE TRANSESOPHAGEAL ECHOCARDIOGRAM (N/A) MITRAL VALVE (MV) REPLACEMENT (N/A) EPICARDIAL PACING LEAD PLACEMENT (N/A)  Subjective: Comfortable, no complaints. Eating better, bowels working.   Objective: Vital signs in last 24 hours: Patient Vitals for the past 24 hrs:  BP Temp Temp src Pulse Resp SpO2 Weight  06/19/13 0411 175/67 mmHg 99 F (37.2 C) Oral 88 18 98 % 143 lb (64.864 kg)  06/18/13 1957 156/62 mmHg 98.9 F (37.2 C) Oral 77 19 95 % -  06/18/13 1644 172/67 mmHg 98.2 F (36.8 C) Oral 93 20 99 % -  06/18/13 1600 159/58 mmHg - - 87 20 97 % -  06/18/13 1500 165/60 mmHg - - 92 21 99 % -  06/18/13 1400 148/51 mmHg - - 79 20 97 % -  06/18/13 1300 170/131 mmHg - - 77 21 98 % -  06/18/13 1237 - 97.3 F (36.3 C) Oral - - - -  06/18/13 1200 171/57 mmHg - - 77 19 98 % -  06/18/13 1100 167/62 mmHg - - 74 18 97 % -  06/18/13 1000 147/53 mmHg - - - 20 - -  06/18/13 0900 149/50 mmHg - - 68 16 100 % -   Current Weight  06/19/13 143 lb (64.864 kg)  PRE-OPERATIVE WEIGHT: 60kg    Intake/Output from previous day: 08/30 0701 - 08/31 0700 In: 1180.1 [P.O.:920; I.V.:110.1; IV Piggyback:150] Out: 1525 [Urine:1525]  CBGs 179-173-181-170    PHYSICAL EXAM:  Heart: RRR Lungs: Clear Wound: Clean and dry Extremities: Trace LE edema    Lab Results: CBC: Recent Labs  06/18/13 0420 06/19/13 0404  WBC 10.0 9.4  HGB 8.5* 9.2*  HCT 25.1* 27.7*  PLT 72* 112*   BMET:  Recent Labs  06/18/13 0420 06/19/13 0404  NA 136 139  K 3.6 3.7  CL 100 102  CO2 20 26  GLUCOSE 190* 181*  BUN 39* 32*  CREATININE 1.19* 0.93  CALCIUM 8.9 9.2    PT/INR:  Recent Labs  06/19/13 0404  LABPROT 14.6  INR 1.16   CXR: Findings: Postsurgical changes are again noted. The right jugular  sheath has been  removed in the interval. The cardiac shadow is  stable. Bilateral basilar atelectasis is again seen. The overall  appearance is stable. No new focal abnormality is noted.  IMPRESSION:  No significant interval change from prior exam    Assessment/Plan: S/P Procedure(s) (LRB): AORTIC VALVE REPLACEMENT (AVR) (N/A) INTRAOPERATIVE TRANSESOPHAGEAL ECHOCARDIOGRAM (N/A) MITRAL VALVE (MV) REPLACEMENT (N/A) EPICARDIAL PACING LEAD PLACEMENT (N/A) CV- Maintaining SR, BPs elevated. Will increase Coreg to home dose, continue Cozaar, Amio, Coumadi. DM- sugars still a little high, eating better.  Continue Metformin, will resume Actos and watch. Vol overload- diurese. Expected postop blood loss anemia- improving, continue Fe. CRPI, pulm toilet. Home in next few days, ?Tuesday, if remains stable.   LOS: 4 days    COLLINS,GINA H 06/19/2013  I have seen and examined the patient and agree with the assessment and plan as outlined.  Making good progress.  OWEN,CLARENCE H 06/19/2013 12:07 PM

## 2013-06-19 NOTE — Progress Notes (Signed)
O2 sats RA 88% at this time; O2 2L [place] applied; will cont. To monitor.

## 2013-06-19 NOTE — Progress Notes (Signed)
Pt ambulated 150 feet with RN; pt c/o weakness in her legs; pt back to room to chair; call bell w/i reach; daughter at bedside; will cont. To monitor.

## 2013-06-19 NOTE — Progress Notes (Signed)
O2 99% 2L [place]; O2 removed at this time; will attempt to ambulate on RA; will cont. To monitor.

## 2013-06-20 DIAGNOSIS — I059 Rheumatic mitral valve disease, unspecified: Secondary | ICD-10-CM

## 2013-06-20 LAB — GLUCOSE, CAPILLARY
Glucose-Capillary: 157 mg/dL — ABNORMAL HIGH (ref 70–99)
Glucose-Capillary: 153 mg/dL — ABNORMAL HIGH (ref 70–99)

## 2013-06-20 MED ORDER — WARFARIN SODIUM 5 MG PO TABS
5.0000 mg | ORAL_TABLET | Freq: Once | ORAL | Status: AC
  Administered 2013-06-20: 5 mg via ORAL
  Filled 2013-06-20: qty 1

## 2013-06-20 MED ORDER — COUMADIN BOOK
Freq: Once | Status: DC
  Filled 2013-06-20: qty 1

## 2013-06-20 NOTE — Progress Notes (Signed)
Clinical Social Work Department CLINICAL SOCIAL WORK PLACEMENT NOTE 06/20/2013  Patient:  Joanna, [name]  Account Number:  [id number] Admit date:  06/15/2013  Clinical Social Worker:  Carren Rang  Date/time:  06/20/2013 03:28 PM  Clinical Social Work is seeking post-discharge placement for this patient at the following level of care:   SKILLED NURSING   (*CSW will update this form in Epic as items are completed)   06/20/2013  Patient/family provided with [facility] Department of Clinical Social Work's list of facilities offering this level of care within the geographic area requested by the patient (or if unable, by the patient's family).  06/20/2013  Patient/family informed of their freedom to choose among providers that offer the needed level of care, that participate in Medicare, Medicaid or managed care program needed by the patient, have an available bed and are willing to accept the patient.  06/20/2013  Patient/family informed of [facility]' ownership interest in [facility], as well as of the fact that they are under no obligation to receive care at this facility.  PASARR submitted to EDS on 06/20/2013 PASARR number received from EDS on 06/20/2013  FL2 transmitted to all facilities in geographic area requested by pt/family on  06/20/2013 FL2 transmitted to all facilities within larger geographic area on   Patient informed that his/her managed care company has contracts with or will negotiate with  certain facilities, including the following:     Patient/family informed of bed offers received:   Patient chooses bed at  Physician recommends and patient chooses bed at    Patient to be transferred to  on   Patient to be transferred to facility by   The following physician request were entered in Epic:   Additional Comments:  Maree Krabbe, MSW, [employer] [phone]

## 2013-06-20 NOTE — Progress Notes (Signed)
CSW attempted to speak to patient about dc planning. Patient sleeping. CSW will try again later or tomorrow.  Maree Krabbe, MSW, Theresia Majors [phone]

## 2013-06-20 NOTE — Progress Notes (Signed)
Patient ambulated 200 feet with walker,  down to room 2W08 x1 assist tolerated well. Will continue to monitor patient Joanna [name], Randall An RN

## 2013-06-20 NOTE — Progress Notes (Addendum)
       [address]       [place] [address]             [phone]          5 Days Post-Op Procedure(s) (LRB): AORTIC VALVE REPLACEMENT (AVR) (N/A) INTRAOPERATIVE TRANSESOPHAGEAL ECHOCARDIOGRAM (N/A) MITRAL VALVE (MV) REPLACEMENT (N/A) EPICARDIAL PACING LEAD PLACEMENT (N/A)  Subjective: Feels well, no complaints.   Objective: Vital signs in last 24 hours: Patient Vitals for the past 24 hrs:  BP Temp Temp src Pulse Resp SpO2 Weight  06/20/13 0446 162/75 mmHg 98.2 F (36.8 C) Oral 96 18 98 % 136 lb 8 oz (61.916 kg)  06/19/13 2014 148/59 mmHg 98.5 F (36.9 C) Oral 86 18 95 % -  06/19/13 1415 153/56 mmHg - - 88 - - -  06/19/13 1400 152/59 mmHg 98.9 F (37.2 C) Oral 84 20 96 % -  06/19/13 1345 150/56 mmHg - - 82 - - -  06/19/13 1330 151/59 mmHg - - 88 - - -  06/19/13 1322 - - - - - 93 % -  06/19/13 1320 146/60 mmHg - - - - 88 % -  06/19/13 1319 138/58 mmHg - - 88 - - -  06/19/13 0939 - - - - - 99 % -   Current Weight  06/20/13 136 lb 8 oz (61.916 kg)  PRE-OPERATIVE WEIGHT: 60kg    Intake/Output from previous day: 08/31 0701 - 09/01 0700 In: 1100 [P.O.:1100] Out: 2525 [Urine:2525]  CBGs 132-440-102   PHYSICAL EXAM:  Heart: RRR Lungs: Slightly diminished BS in bases Wound: Clean and dry Extremities: No edema    Lab Results: CBC: Recent Labs  06/18/13 0420 06/19/13 0404  WBC 10.0 9.4  HGB 8.5* 9.2*  HCT 25.1* 27.7*  PLT 72* 112*   BMET:  Recent Labs  06/18/13 0420 06/19/13 0404  NA 136 139  K 3.6 3.7  CL 100 102  CO2 20 26  GLUCOSE 190* 181*  BUN 39* 32*  CREATININE 1.19* 0.93  CALCIUM 8.9 9.2    PT/INR:  Recent Labs  06/20/13 0430  LABPROT 13.9  INR 1.09      Assessment/Plan: S/P Procedure(s) (LRB): AORTIC VALVE REPLACEMENT (AVR) (N/A) INTRAOPERATIVE TRANSESOPHAGEAL ECHOCARDIOGRAM (N/A) MITRAL VALVE (MV) REPLACEMENT (N/A) EPICARDIAL PACING LEAD PLACEMENT (N/A) CV- SR, BPs remain high. Back on home doses  of Cozaar, Coreg.  Will add Norvasc and watch.  Continue Amio, increase Coumadin today as INR dropped. Vol overload- continue diuresis. DM- sugars improving on home meds. A1C=7.6.  Will continue to monitor. Continue CRPI, ambulation. Home with daughter soon if she continues to progress.   LOS: 5 days    COLLINS,GINA H 06/20/2013  I have seen and examined the patient and agree with the assessment and plan as outlined.  However, I do not agree with adding norvasc - BP is probably adequately controlled at present.  Low diastolic pressure with relatively high systolic pressure is a reflection of her incredibly non-compliant vasculature - I don't think we should overtreat her BP beyond the medications she was taking at home preop unless her BP increases substantially further.  Overall making excellent progress.  Daana Petrasek H 06/20/2013 10:51 AM

## 2013-06-20 NOTE — Progress Notes (Signed)
Subjective:   Slept well. Ambulating hall with assistance. Maintaining SR.    Intake/Output Summary (Last 24 hours) at 06/20/13 0931 Last data filed at 06/20/13 0759  Gross per 24 hour  Intake    980 ml  Output   1925 ml  Net   -945 ml    Current meds: . acetaminophen  1,000 mg Oral Q6H  . amiodarone  200 mg Oral BID PC  . aspirin EC  81 mg Oral Daily  . bisacodyl  10 mg Oral Daily   Or  . bisacodyl  10 mg Rectal Daily  . carvedilol  12.5 mg Oral BID WC  . docusate sodium  100 mg Oral BID  . ferrous sulfate  325 mg Oral BID WC  . furosemide  60 mg Oral BID  . insulin aspart  0-24 Units Subcutaneous TID AC & HS  . losartan  100 mg Oral Daily  . metFORMIN  1,000 mg Oral BID WC  . pantoprazole  40 mg Oral Daily  . pioglitazone  45 mg Oral Daily  . potassium chloride  20 mEq Oral Daily  . sodium chloride  3 mL Intravenous Q12H  . sodium chloride  3 mL Intravenous Q12H  . warfarin  2 mg Oral q1800  . Warfarin - Physician Dosing Inpatient   Does not apply q1800   Infusions: . sodium chloride 250 mL (06/16/13 0631)     Objective:  Blood pressure 162/75, pulse 96, temperature 98.2 F (36.8 C), temperature source Oral, resp. rate 18, height 5\' 6"  (1.676 m), weight 61.916 kg (136 lb 8 oz), SpO2 98.00%. Weight change: -2.948 kg (-6 lb 8 oz)  Physical Exam: General:  Elderly. No distress HEENT: normal Neck: supple. JVP 6 . Carotids 2+ bilat; no bruits. No lymphadenopathy or thryomegaly appreciated. Cor: Sternum looks good. RRR Lungs: clear decreased at bases Abdomen: soft, nontender, nondistended. No hepatosplenomegaly. No bruits or masses. Good bowel sounds. Extremities: no cyanosis, clubbing, rash, edema Neuro: alert & orientedx3, cranial nerves grossly intact. moves all 4 extremities w/o difficulty. Affect pleasant  Telemetry: Sinus  Lab Results: Basic Metabolic Panel:  Recent Labs Lab 06/13/13 1351  06/15/13 2100  06/16/13 0500 06/16/13 1745 06/16/13 1802  06/17/13 0435 06/18/13 0420 06/19/13 0404  NA 140  < >  --   < > 143  --  142 140 136 139  K 3.6  < >  --   < > 3.7  --  4.4 3.8 3.6 3.7  CL 98  --   --   < > 111  --  109 106 100 102  CO2 30  --   --   --  23  --   --  20 20 26   GLUCOSE 123*  < >  --   < > 109*  --  163* 136* 190* 181*  BUN 36*  --   --   < > 28*  --  30* 35* 39* 32*  CREATININE 1.28*  --  0.85  < > 0.88 1.07 1.20* 1.21* 1.19* 0.93  CALCIUM 10.2  --   --   --  8.5  --   --  8.8 8.9 9.2  MG  --   --  3.4*  --  3.0* 2.8*  --   --   --   --   < > = values in this interval not displayed. Liver Function Tests:  Recent Labs Lab 06/13/13 1351  AST 18  ALT 9  ALKPHOS  22*  BILITOT 0.3  PROT 6.9  ALBUMIN 4.2   No results found for this basename: LIPASE, AMYLASE,  in the last 168 hours No results found for this basename: AMMONIA,  in the last 168 hours CBC:  Recent Labs Lab 06/16/13 0500 06/16/13 1745 06/16/13 1802 06/17/13 0435 06/18/13 0420 06/19/13 0404  WBC 10.0 10.8*  --  9.4 10.0 9.4  HGB 9.0* 8.7* 8.2* 8.3* 8.5* 9.2*  HCT 26.1* 26.3* 24.0* 24.0* 25.1* 27.7*  MCV 90.3 92.9  --  93.0 92.3 91.4  PLT 89* 73*  --  67* 72* 112*   Cardiac Enzymes: No results found for this basename: CKTOTAL, CKMB, CKMBINDEX, TROPONINI,  in the last 168 hours BNP: No components found with this basename: POCBNP,  CBG:  Recent Labs Lab 06/19/13 0619 06/19/13 1138 06/19/13 1642 06/19/13 2220 06/20/13 0606  GLUCAP 170* 148* 157* 171* 157*   Microbiology: Lab Results  Component Value Date   CULT Multiple bacterial morphotypes present, none predominant. Suggest appropriate recollection if clinically indicated. 05/09/2013   No results found for this basename: CULT, SDES,  in the last 168 hours  Imaging: Dg Chest 2 View  06/19/2013   *RADIOLOGY REPORT*  Clinical Data: Follow up atelectasis  CHEST - 2 VIEW  Comparison: 06/17/2013  Findings: Postsurgical changes are again noted.  The right jugular sheath has been removed  in the interval.  The cardiac shadow is stable.  Bilateral basilar atelectasis is again seen.  The overall appearance is stable.  No new focal abnormality is noted.  IMPRESSION: No significant interval change from prior exam.   Original Report Authenticated By: Alcide Clever, M.D.     ASSESSMENT:  1. AS s/p AVR  2. MR s/p MV repair 3. Chronic systolic HF - stable 4. DM2 5. HTN  PLAN/DISCUSSION:  Doing very well post AVR and MV repair. Maintaining SR. Ambulating. Weight nearly back to baseline. No HF. Agree with resuming home meds for HTN/   Should be ready to go home soon. Appreciate TCTS care.    LOS: 5 days    Arvilla Meres, MD 06/20/2013, 9:31 AM

## 2013-06-20 NOTE — Progress Notes (Signed)
Clinical Social Work Department BRIEF PSYCHOSOCIAL ASSESSMENT 06/20/2013  Patient:  Joanna [name], Joanna [name]     Account Number:  [id number]     Admit date:  06/15/2013  Clinical Social Worker:  Carren Rang  Date/Time:  06/20/2013 03:23 PM  Referred by:  Care Management  Date Referred:  06/20/2013 Referred for  SNF Placement   Other Referral:   Interview type:  Other - See comment Other interview type:   CSW spoke to patient who referred CSW to daughter Joanna [name]    PSYCHOSOCIAL DATA Living Status:  ALONE Admitted from facility:   Level of care:   Primary support name:  [name] [phone] Primary support relationship to patient:  CHILD, ADULT Degree of support available:   Good    CURRENT CONCERNS Current Concerns  Post-Acute Placement   Other Concerns:    SOCIAL WORK ASSESSMENT / PLAN Clinical Social Worker received referral for SNF placement at d/c. CSW introduced self and explained reason for visit. CSW explained SNF process to patient. Patient reported she is agreeable for SNF placement and her daughter has been looking for placement. Their first preference is [facility] and their  second choice is [facility]. Patient referred CSW to daughter. CSW called daughter on phone. Daughter stated she has been to [facility], and is hoping they have availability when medically ready for dc. CSW encouraged family to think about additional SNF options pending availability of preferred facility. CSW will complete FL2 for MD's signature and will update patient and family when bed offers are received.   Assessment/plan status:  Psychosocial Support/Ongoing Assessment of Needs Other assessment/ plan:   Information/referral to community resources:   SNF INFORMATION    PATIENT'S/FAMILY'S RESPONSE TO PLAN OF CARE: Patient and daughter agreeable to SNF option and is agreeable to be faxed out to [facility].       Maree Krabbe, MSW, Theresia Majors [phone]

## 2013-06-20 NOTE — Progress Notes (Signed)
06/20/13 Nursing note  Patient nauseated this morning given ginger ale and patient states nausea relieved. Will monitor patient. Tuleen Mandelbaum, Randall An RN

## 2013-06-21 LAB — PROTIME-INR
INR: 1.27 (ref 0.00–1.49)
Prothrombin Time: 15.6 seconds — ABNORMAL HIGH (ref 11.6–15.2)

## 2013-06-21 LAB — GLUCOSE, CAPILLARY: Glucose-Capillary: 155 mg/dL — ABNORMAL HIGH (ref 70–99)

## 2013-06-21 MED ORDER — WARFARIN SODIUM 5 MG PO TABS
5.0000 mg | ORAL_TABLET | Freq: Once | ORAL | Status: AC
  Administered 2013-06-21: 5 mg via ORAL
  Filled 2013-06-21: qty 1

## 2013-06-21 MED ORDER — PIOGLITAZONE HCL 45 MG PO TABS
45.0000 mg | ORAL_TABLET | Freq: Every day | ORAL | Status: DC
  Administered 2013-06-21 – 2013-06-27 (×7): 45 mg via ORAL
  Filled 2013-06-21 (×7): qty 1

## 2013-06-21 NOTE — Care Management Note (Signed)
    Page 1 of 1   06/27/2013     4:09:04 PM   CARE MANAGEMENT NOTE 06/27/2013  Patient:  Joanna [name], Joanna [name]   Account Number:  [id number]  Date Initiated:  06/16/2013  Documentation initiated by:  Alvira Philips Assessment:   79 yr-old female adm s/p AVR; lives alone     Action/Plan:   Anticipated DC Date:  06/22/2013   Anticipated DC Plan:  SKILLED NURSING FACILITY  In-house referral  Clinical Social Worker      DC Planning Services  CM consult      Choice offered to / List presented to:             Status of service:  Completed, signed off Medicare Important Message given?   (If response is "NO", the following Medicare IM given date fields will be blank) Date Medicare IM given:   Date Additional Medicare IM given:    Discharge Disposition:  SKILLED NURSING FACILITY  Per UR Regulation:  Reviewed for med. necessity/level of care/duration of stay  If discussed at Long Length of Stay Meetings, dates discussed:    Comments:  PCP: Dr Illene Regulus  Contact: [name], dtr  [phone], [phone]  06/27/13 Aayden Cefalu,RN,BSN [phone] PT FOR DC TO [facility] TODAY, PER CSW ARRANGEMENTS.  06/21/13 Patsye Sullivant,RN,BSN [phone] PT FOR LIKELY DC TO SNF TOMORROW, PER CSW ARRANGEMENTS.  06/16/13 1056 Henrietta Mayo RN MSN BSN CCM Per RN, dtr and son talked with pt and discussed ST-SNF for rehab, pt in agreement.  Pt requests placement @ [facility]. CSW notified.

## 2013-06-21 NOTE — Discharge Summary (Signed)
Physician Discharge Summary  Patient ID: Joanna [name] MRN: [id number] DOB/AGE: 21-Feb-1930 79 y.o.  Admit date: 06/15/2013 Discharge date: 06/21/2013  Admission Diagnoses:  Patient Active Problem List   Diagnosis Date Noted  . Aortic stenosis, severe 06/13/2013  . Mitral valve disorders 06/13/2013  . Mitral regurgitation   . Acute respiratory failure 05/09/2013  . Acute pulmonary edema 05/09/2013  . Routine health maintenance 11/13/2011  . Chronic systolic heart failure 09/26/2011  . Iron deficiency anemia 09/21/2011  . Aortic stenosis 09/21/2011  . CVA 03/14/2010  . AORTIC VALVE SCLEROSIS 02/21/2010  . PULMONARY HYPERTENSION 03/23/2009  . CAROTID ARTERY STENOSIS 01/05/2009  . SKIN RASH, ALLERGIC 04/07/2008  . Edema 04/07/2008  . 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 ARTHUS PHENOMENON 09/17/2007  . DM 07/30/2007  . HYPERLIPIDEMIA 07/30/2007  . HYPERTENSION 07/30/2007  . BUNDLE BRANCH BLOCK, LEFT 07/30/2007  . PERIPHERAL VASCULAR DISEASE 07/30/2007  . POLYPECTOMY, HX OF 07/30/2007   Discharge Diagnoses:   Patient Active Problem List   Diagnosis Date Noted  . S/P aortic and mitral valve replacement with bioprosthetic valves 06/15/2013  . S/P mitral and aortic valve replacement with bioprosthetic valve 06/15/2013  . Aortic stenosis, severe 06/13/2013  . Mitral valve disorders 06/13/2013  . Mitral regurgitation   . Acute respiratory failure 05/09/2013  . Acute pulmonary edema 05/09/2013  . Routine health maintenance 11/13/2011  . Chronic systolic heart failure 09/26/2011  . Iron deficiency anemia 09/21/2011  . Aortic stenosis 09/21/2011  . CVA 03/14/2010  . AORTIC VALVE SCLEROSIS 02/21/2010  . PULMONARY HYPERTENSION 03/23/2009  . CAROTID ARTERY STENOSIS 01/05/2009  . SKIN RASH, ALLERGIC 04/07/2008  . Edema 04/07/2008  . 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 ARTHUS PHENOMENON 09/17/2007  . DM 07/30/2007  . HYPERLIPIDEMIA 07/30/2007  . HYPERTENSION 07/30/2007  . BUNDLE  BRANCH BLOCK, LEFT 07/30/2007  . PERIPHERAL VASCULAR DISEASE 07/30/2007  . POLYPECTOMY, HX OF 07/30/2007   Discharged Condition: good  History of Present Illness:   Patient is an 79 year old white female with known history aortic stenosis, hypertension, type 2 diabetes mellitus, hyperlipidemia, and peripheral arterial disease. The patient states that she has been told that she had a heart murmur all of her life.  She first presented with acute systolic congestive heart failure with syncope in November of 2012. At that time echocardiogram revealed moderate aortic stenosis with left ventricular ejection fraction 30%. Left and right heart catheterization was performed demonstrating normal coronary artery anatomy. There was moderate pulmonary hypertension with normal cardiac output. Mean gradient across the aortic valve at catheterization was measured 12 mm mercury.  The patient was treated medically and has been followed ever since in the heart failure clinic. Repeat transthoracic echocardiogram performed in July of this year demonstrated left ventricular ejection fraction 30% with moderate to severe aortic stenosis. Peak velocity across the aortic valve measured 3.7 m/s corresponding to peak and mean transvalvular gradients of 63 and 33 mm mercury respectively. There was moderate mitral regurgitation with moderate pulmonary hypertension, and septal lateral dys-synchrony. At that time the patient reported stable class II symptoms of mild exertional shortness of breath. However, while she was in the clinic she developed sudden onset of severe diaphoresis and dyspnea and briefly passed out. She did not have any chest pain. Oxygen saturations transiently dropped into the 70s wall systolic blood pressure was elevated to greater than 160. Electrocardiogram revealed sinus rhythm with left bundle branch block. She was sent directly to emergency room and subsequently hospitalized. Chest x-ray showed diffuse pulmonary  edema and pro  BNP levels were elevated, as were troponin. She was treated with intravenous Lasix and rapidly improved. Dobutamine stress echocardiogram was performed and confirmed that the patient's gradient across the aortic valve increased substantially with stress, confirming the presence of severe aortic stenosis. The patient was discharged from the hospital with instructions to follow up with [facility] for possible surgical intervention.  The patient was originally seen in consultation on 05/12/2013. Since then she underwent transesophageal echocardiogram and left heart catheterization by Dr. Shirlee Latch on 05/19/2013. Transesophageal echocardiogram confirmed the presence of likely low-gradient severe aortic stenosis and also documented the presence of at least moderate mitral regurgitation. There was severe calcification of the posterior mitral annulus with significant restriction of the posterior leaflet. Left heart catheterization was notable for the absence of significant coronary artery disease and also confirmed the presence of severe mitral annular calcification. Right heart catheterization was attempted but could not be performed.  Due to these findings and the patient's progression of symptoms if was felt she would benefit from surgical intervention on her Aortic Valve.  Dr. Cornelius Moras evaluated the patient and the risks and benefits of the procedure were explained to the patient.  She was agreeable to proceed.    Hospital Course:   The patient presented to [facility] on 06/15/2013.  She was taken to the operating room and underwent Aortic Valve Replacement utilizing a 21 mm Edwards Magna Ease Pericardial Tissue Valve, Mitral Valve Replacement utilizing a 25 mm Edwards Magna Mitral Pericardial Tissue Valve, and Placement of Left Ventricular Epicardial Pacemaker leads.  The patient tolerated the procedure well and was taken to the SICU in stable condition.  During her stay in the ICU the patient was  extubated early morning of POD #1.  Her chest tubes and arterial lines were removed without difficulty.  She developed rapid Atrial Fibrillation with subsequent treatment of IV Amiodarone.  She converted back to NSR and was transitioned to an oral regimen of Amiodarone.  She was started on Coumadin for stroke prophylaxis.  Once medically stable the patient was transferred to the step down unit in stable condition.  The patient has continued to progress.  She is maintaining NSR and her temporary pacing wires were removed without difficulty.  Her blood sugars have been well controlled.  She remains on Coumadin with INR trending up.  She will need to keep her INR between 2.0-3.0  The patient has been experiencing some hypertension which is felt to be due to non-compliant vasculature.  Therefore, she was not placed on additional anti-hypertensive agents.  The patient is ambulating with minimal assistance.  She was evaluated by PT/OT who feels the patient would benefit from SNF.  She is medically stable at this time.  Should no further issues arise we anticipate discharge in the next 24-48 hours.  She will need to follow up with Dr. Cornelius Moras in 3 weeks with a CXR.  She will also need to follow up with the Heart Failure Clinic in 2-4 weeks.       Consults: cardiology  Significant Diagnostic Studies:  - Left ventricle: The cavity size was normal. Wall thickness was increased in a pattern of mild to moderate LVH. Septal and inferior hypokinesis, septal-lateral dyssynchrony. The estimated ejection fraction was 35%. - Aortic valve: Trileaflet; severely calcified leaflets. Suspect low gradient severe aortic stenosis. Mean gradient: 32mm Hg (S). Peak gradient: 57mm Hg (S). - Aorta: Normal caliber with grade III plaque in descending thoracic aorta. - Mitral valve: There was calcification of the  mitral annulus and calcification with some degree of fixation of the posterior mitral leaflet. There was moderate  mitral regurgitation. No significant stenosis. - Left atrium: The atrium was mildly dilated. No evidence of thrombus in the atrial cavity or appendage. - Pulmonary veins: No pulmonary vein systolic doppler flow reversal noted. - Right ventricle: The cavity size was normal. Systolic function was normal. - Right atrium: No evidence of thrombus in the atrial cavity or appendage. - Atrial septum: No defect or patent foramen ovale was identified. Echo contrast study showed no right-to-left atrial level shunt, at baseline or with provocation.  Treatments: surgery:   Aortic Valve Replacement Edwards Magna Ease Pericardial Tissue Valve (size 21 mm, model # 3300TFX, serial # [id number])  Mitral Valve Replacement [facility] Mitral Pericardial Tissue Valve (size 25 mm, model # 7300TFX, serial # [id number])  Placement of Left Ventricular Epicardial Pacemaker Leads x2  Disposition: SNF  Discharge Medications:   The patient has been discharged on:   1.Beta Blocker:  Yes [ x  ]                              No   [   ]                              If No, reason:  2.Ace Inhibitor/ARB: Yes [  x ]                                     No  [    ]                                     If No, reason:  3.Statin:   Yes [x   ]                  No  [   ]                  If No, reason:  4.Ecasa:  Yes  [ x  ]                  No   [   ]                  If No, reason:       Medication List    STOP taking these medications       METOLAZONE PO      TAKE these medications       amiodarone 400 MG tablet  Commonly known as:  PACERONE  Take 0.5 tablets (200 mg total) by mouth 2 (two) times daily.     aspirin EC 81 MG tablet  Take 81 mg by mouth daily.     calcium citrate-vitamin D 315-200 MG-UNIT per tablet  Commonly known as:  CITRACAL+D  Take 2 tablets by mouth 2 (two) times daily.     carvedilol 6.25 MG tablet  Commonly known as:  COREG  Take 12.5 mg by mouth 2 (two) times daily with  a meal. Morning and afternoon     cholecalciferol 1000 UNITS tablet  Commonly known as:  VITAMIN D  Take 2,000 Units by mouth daily.  docusate sodium 100 MG capsule  Commonly known as:  COLACE  Take 100 mg by mouth 2 (two) times daily.     ferrous sulfate 325 (65 FE) MG tablet  Take 325 mg by mouth 2 (two) times daily with a meal.     fish oil-omega-3 fatty acids 1000 MG capsule  Take 1 g by mouth 2 (two) times daily.     furosemide 40 MG tablet  Commonly known as:  LASIX  Take 1 tablet (40 mg total) by mouth daily.     Garlic 1000 MG Caps  Take 1 capsule by mouth daily.     losartan 100 MG tablet  Commonly known as:  COZAAR  Take 100 mg by mouth daily.     metFORMIN 1000 MG tablet  Commonly known as:  GLUCOPHAGE  Take 1,000 mg by mouth 2 (two) times daily with a meal.     pioglitazone 45 MG tablet  Commonly known as:  ACTOS  Take 45 mg by mouth daily.     Potassium Chloride ER 20 MEQ Tbcr  Take 20 mEq by mouth daily.     simvastatin 80 MG tablet  Commonly known as:  ZOCOR  Take 80 mg by mouth at bedtime.     traMADol 50 MG tablet  Commonly known as:  ULTRAM  Take 1-2 tablets (50-100 mg total) by mouth every 6 (six) hours as needed (50mg  - moderate pain; 100mg  - severe pain).     warfarin 1.25 mg Tabs tablet  Commonly known as:  COUMADIN  Take 0.5 tablets (1.25 mg total) by mouth daily at 6 PM.          Signed: Abi Shoults 06/21/2013, 10:45 AM

## 2013-06-21 NOTE — Progress Notes (Signed)
CSW spoke to patient and family to give bed offers. Patient and family chose [facility]. CSW spoke with facility to confirm bed when patient medically ready for dc. FL2 on chart  Maree Krabbe, MSW, [place] [phone]

## 2013-06-21 NOTE — Progress Notes (Addendum)
      [address]       [place] [address]             [phone]      6 Days Post-Op Procedure(s) (LRB): AORTIC VALVE REPLACEMENT (AVR) (N/A) INTRAOPERATIVE TRANSESOPHAGEAL ECHOCARDIOGRAM (N/A) MITRAL VALVE (MV) REPLACEMENT (N/A) EPICARDIAL PACING LEAD PLACEMENT (N/A)  Subjective:  Ms. Robertshaw complains of some shortness of breath with ambulation.  She states this has been improving.  +BM  Objective: Vital signs in last 24 hours: Temp:  [97.9 F (36.6 C)-98.4 F (36.9 C)] 97.9 F (36.6 C) (09/02 0532) Pulse Rate:  [78-84] 81 (09/02 0532) Cardiac Rhythm:  [-] Normal sinus rhythm;Heart block (09/02 0739) Resp:  [16-18] 18 (09/02 0532) BP: (95-152)/(40-62) 152/53 mmHg (09/02 0532) SpO2:  [92 %-94 %] 92 % (09/02 0532) Weight:  [135 lb 4.8 oz (61.372 kg)] 135 lb 4.8 oz (61.372 kg) (09/02 0532)  Intake/Output from previous day: 09/01 0701 - 09/02 0700 In: 720 [P.O.:720] Out: 1901 [Urine:1900; Stool:1]  General appearance: alert, cooperative and no distress Heart: regular rate and rhythm Lungs: clear to auscultation bilaterally Abdomen: soft, non-tender; bowel sounds normal; no masses,  no organomegaly Extremities: edema trace Wound: clean and dry  Lab Results:  Recent Labs  06/19/13 0404  WBC 9.4  HGB 9.2*  HCT 27.7*  PLT 112*   BMET:  Recent Labs  06/19/13 0404  NA 139  K 3.7  CL 102  CO2 26  GLUCOSE 181*  BUN 32*  CREATININE 0.93  CALCIUM 9.2    PT/INR:  Recent Labs  06/21/13 0545  LABPROT 15.6*  INR 1.27   ABG    Component Value Date/Time   PHART 7.306* 06/16/2013 0215   HCO3 24.2* 06/16/2013 0215   TCO2 20 06/16/2013 1802   ACIDBASEDEF 2.0 06/16/2013 0215   O2SAT 97.0 06/16/2013 0215   CBG (last 3)   Recent Labs  06/20/13 1637 06/20/13 2107 06/21/13 0559  GLUCAP 162* 153* 165*    Assessment/Plan: S/P Procedure(s) (LRB): AORTIC VALVE REPLACEMENT (AVR) (N/A) INTRAOPERATIVE TRANSESOPHAGEAL ECHOCARDIOGRAM  (N/A) MITRAL VALVE (MV) REPLACEMENT (N/A) EPICARDIAL PACING LEAD PLACEMENT (N/A)  1. CV- NSR, blood pressure continues to run 90s-150s- will continue Coreg and Cozaar 2. INR 1.27- will repeat 5 mg Coumadin tonight 3. Renal- mildly volume overloaded, continue diuresis 4. DM- CBGs controlled, continue home regimen 5. Deconditioning- continue PT/OT 6. Dispo- patient doing well, hopefully to SNF in AM   LOS: 6 days    BARRETT, ERIN 06/21/2013  I have seen and examined the patient and agree with the assessment and plan as outlined.  Overall making good progress.  Looking into options for d/c - possibly to SNF tomorrow.  Abed Schar H 06/21/2013 8:15 AM

## 2013-06-21 NOTE — Progress Notes (Signed)
CARDIAC REHAB PHASE I   PRE:  Rate/Rhythm: 84 SR 1 HB  BP:  Supine:   Sitting: 120/50  Standing:    SaO2: 97%RA  MODE:  Ambulation: 350 ft   POST:  Rate/Rhythm: 91SR  BP:  Supine:   Sitting: 106/50  Standing:    SaO2: 93%RA 1011-1044 Pt walked 350 ft on RA with rolling walker and asst x 1 with fairly steady gait. Encouraged pt to stand upright and take deep breaths. Stopped several times to rest.  To recliner after walk. States she has rolling walker at home for use. Call bell in reach.   Luetta Nutting, RN BSN  06/21/2013 10:41 AM

## 2013-06-22 ENCOUNTER — Inpatient Hospital Stay (HOSPITAL_COMMUNITY): Payer: Medicare Other

## 2013-06-22 LAB — BASIC METABOLIC PANEL
BUN: 21 mg/dL (ref 6–23)
CO2: 35 mEq/L — ABNORMAL HIGH (ref 19–32)
Calcium: 9.1 mg/dL (ref 8.4–10.5)
Creatinine, Ser: 0.86 mg/dL (ref 0.50–1.10)
Glucose, Bld: 200 mg/dL — ABNORMAL HIGH (ref 70–99)

## 2013-06-22 LAB — GLUCOSE, CAPILLARY
Glucose-Capillary: 216 mg/dL — ABNORMAL HIGH (ref 70–99)
Glucose-Capillary: 127 mg/dL — ABNORMAL HIGH (ref 70–99)
Glucose-Capillary: 162 mg/dL — ABNORMAL HIGH (ref 70–99)

## 2013-06-22 LAB — CBC
Hemoglobin: 9.3 g/dL — ABNORMAL LOW (ref 12.0–15.0)
MCH: 31.2 pg (ref 26.0–34.0)
MCV: 92.3 fL (ref 78.0–100.0)
Platelets: 201 10*3/uL (ref 150–400)
RBC: 2.98 MIL/uL — ABNORMAL LOW (ref 3.87–5.11)

## 2013-06-22 MED ORDER — POTASSIUM CHLORIDE CRYS ER 20 MEQ PO TBCR
40.0000 meq | EXTENDED_RELEASE_TABLET | Freq: Two times a day (BID) | ORAL | Status: DC
  Administered 2013-06-22 – 2013-06-23 (×4): 40 meq via ORAL
  Filled 2013-06-22 (×6): qty 2

## 2013-06-22 MED ORDER — WARFARIN SODIUM 5 MG PO TABS
5.0000 mg | ORAL_TABLET | Freq: Once | ORAL | Status: AC
  Administered 2013-06-22: 5 mg via ORAL
  Filled 2013-06-22: qty 1

## 2013-06-22 NOTE — Progress Notes (Addendum)
[address]       [place] [address]             [phone]      7 Days Post-Op  Procedure(s) (LRB): AORTIC VALVE REPLACEMENT (AVR) (N/A) INTRAOPERATIVE TRANSESOPHAGEAL ECHOCARDIOGRAM (N/A) MITRAL VALVE (MV) REPLACEMENT (N/A) EPICARDIAL PACING LEAD PLACEMENT (N/A) Subjective: Feels well, mild nausea at times +BM, ambulation conts to improve  Objective  Telemetry sinus, BBB, PAC's  Temp:  [98.2 F (36.8 C)-99.2 F (37.3 C)] 98.3 F (36.8 C) (09/03 0441) Pulse Rate:  [79-91] 83 (09/03 0441) Resp:  [18-20] 20 (09/03 0441) BP: (120-149)/(41-83) 149/43 mmHg (09/03 0441) SpO2:  [95 %-98 %] 95 % (09/03 0441) Weight:  [131 lb 6.4 oz (59.603 kg)] 131 lb 6.4 oz (59.603 kg) (09/03 0441)   Intake/Output Summary (Last 24 hours) at 06/22/13 0727 Last data filed at 06/22/13 0448  Gross per 24 hour  Intake    600 ml  Output   3002 ml  Net  -2402 ml       General appearance: alert, cooperative and no distress Heart: regular rate and rhythm and + Rub, soft syst murmur Lungs: min dim in bases Abdomen: soft, nontender, non distended Extremities: minor edema Wound: incis healing well  Lab Results:  Recent Labs  06/22/13 0540  NA 141  K 2.5*  CL 96  CO2 35*  GLUCOSE 200*  BUN 21  CREATININE 0.86  CALCIUM 9.1   No results found for this basename: AST, ALT, ALKPHOS, BILITOT, PROT, ALBUMIN,  in the last 72 hours No results found for this basename: LIPASE, AMYLASE,  in the last 72 hours  Recent Labs  06/22/13 0540  WBC 8.6  HGB 9.3*  HCT 27.5*  MCV 92.3  PLT 201   No results found for this basename: CKTOTAL, CKMB, TROPONINI,  in the last 72 hours No components found with this basename: POCBNP,  No results found for this basename: DDIMER,  in the last 72 hours No results found for this basename: HGBA1C,  in the last 72 hours No results found for this basename: CHOL, HDL, LDLCALC, TRIG, CHOLHDL,  in the last 72 hours No results found for  this basename: TSH, T4TOTAL, FREET3, T3FREE, THYROIDAB,  in the last 72 hours No results found for this basename: VITAMINB12, FOLATE, FERRITIN, TIBC, IRON, RETICCTPCT,  in the last 72 hours  Medications: Scheduled . amiodarone  200 mg Oral BID PC  . aspirin EC  81 mg Oral Daily  . bisacodyl  10 mg Oral Daily   Or  . bisacodyl  10 mg Rectal Daily  . carvedilol  12.5 mg Oral BID WC  . coumadin book   Does not apply Once  . docusate sodium  100 mg Oral BID  . ferrous sulfate  325 mg Oral BID WC  . furosemide  60 mg Oral BID  . insulin aspart  0-24 Units Subcutaneous TID AC & HS  . losartan  100 mg Oral Daily  . metFORMIN  1,000 mg Oral BID WC  . pantoprazole  40 mg Oral Daily  . pioglitazone  45 mg Oral Daily  . potassium chloride  20 mEq Oral Daily  . sodium chloride  3 mL Intravenous Q12H  . Warfarin - Physician Dosing Inpatient   Does not apply q1800     Radiology/Studies:  No results found.  INR:1.39 Will add last result for INR, ABG once components are confirmed Will add last 4 CBG results  once components are confirmed  Assessment/Plan: S/P Procedure(s) (LRB): AORTIC VALVE REPLACEMENT (AVR) (N/A) INTRAOPERATIVE TRANSESOPHAGEAL ECHOCARDIOGRAM (N/A) MITRAL VALVE (MV) REPLACEMENT (N/A) EPICARDIAL PACING LEAD PLACEMENT (N/A)  1 stable rhythm for most part, some PAC's I believe at times. SBP 120's to 140's 2 K+ replace 3 BNP  Over 14,000- cont lasix, does not look clinically to be significantly volume overloaded, renal fxn stable 4 H/H stable 5 conts ac rx 6 poss to SNF soon, cont rehab modalities    LOS: 7 days    [name],Joanna E 9/3/20147:27 AM  I have seen and examined the patient and agree with the assessment and plan as outlined.  [name],[name] 06/22/2013 8:49 AM

## 2013-06-22 NOTE — Progress Notes (Signed)
PT POTASSIUM IS 2.5 THIS AM. MADE WAYNE GOLD, PA AWARE. AWAITING ORDERS.

## 2013-06-22 NOTE — Progress Notes (Signed)
CSW updated facility about change in dc date. Facility stated they can't guarantee a bed tomorrow, but "it is a possibility" there will be an empty room for patient. CSW updated patient and family of this change. CSW will update when new information arises.  Maree Krabbe, MSW, [place] [phone]

## 2013-06-22 NOTE — Progress Notes (Signed)
CARDIAC REHAB PHASE I   PRE:  Rate/Rhythm: 84SR  BP:  Supine:   Sitting: 118/50  Standing:    SaO2: 98%RA  MODE:  Ambulation: 450 ft   POST:  Rate/Rhythm: 85SR  BP:  Supine: 132/60  Sitting:   Standing:    SaO2: 96%RA 0955-1020 Pt walked 450 ft on RA with rolling walker and asst x 1. Gait steady. Tolerated well. To bed after walk. Still slightly nauseated. Stopped a couple of times to rest.   Luetta Nutting, RN BSN  06/22/2013 10:18 AM

## 2013-06-23 ENCOUNTER — Other Ambulatory Visit: Payer: Self-pay

## 2013-06-23 DIAGNOSIS — N179 Acute kidney failure, unspecified: Secondary | ICD-10-CM

## 2013-06-23 DIAGNOSIS — I4891 Unspecified atrial fibrillation: Secondary | ICD-10-CM

## 2013-06-23 LAB — GLUCOSE, CAPILLARY

## 2013-06-23 LAB — BASIC METABOLIC PANEL
CO2: 33 mEq/L — ABNORMAL HIGH (ref 19–32)
Calcium: 9 mg/dL (ref 8.4–10.5)
Creatinine, Ser: 1.39 mg/dL — ABNORMAL HIGH (ref 0.50–1.10)
Glucose, Bld: 268 mg/dL — ABNORMAL HIGH (ref 70–99)

## 2013-06-23 MED ORDER — AMIODARONE LOAD VIA INFUSION: 150.0000 mg | Freq: Once | INTRAVENOUS | Status: DC

## 2013-06-23 MED ORDER — AMIODARONE IV BOLUS ONLY 150 MG/100ML
150.0000 mg | Freq: Once | INTRAVENOUS | Status: AC
  Administered 2013-06-23: 150 mg via INTRAVENOUS
  Filled 2013-06-23: qty 100

## 2013-06-23 MED ORDER — MAGNESIUM OXIDE 400 (241.3 MG) MG PO TABS
400.0000 mg | ORAL_TABLET | Freq: Two times a day (BID) | ORAL | Status: AC
  Administered 2013-06-23 – 2013-06-25 (×3): 400 mg via ORAL
  Filled 2013-06-23 (×5): qty 1

## 2013-06-23 MED ORDER — AMIODARONE HCL 200 MG PO TABS
400.0000 mg | ORAL_TABLET | Freq: Two times a day (BID) | ORAL | Status: DC
  Administered 2013-06-23 – 2013-06-26 (×7): 400 mg via ORAL
  Filled 2013-06-23 (×9): qty 2

## 2013-06-23 MED ORDER — FUROSEMIDE 40 MG PO TABS: 40.0000 mg | ORAL_TABLET | Freq: Every day | ORAL | Status: DC

## 2013-06-23 NOTE — Progress Notes (Signed)
Brief Nutrition Note:  RD pulled to pt for positive malnutrition screening tool.  Pt reports she lost about 25 lbs prior to admission. Weight hx reviewed: Wt Readings from Last 10 Encounters:  06/23/13 131 lb 4.8 oz (59.557 kg)  06/23/13 131 lb 4.8 oz (59.557 kg)  06/13/13 133 lb (60.328 kg)  06/13/13 135 lb 8 oz (61.462 kg)  06/02/13 143 lb 8 oz (65.091 kg)  05/19/13 135 lb (61.236 kg)  05/19/13 135 lb (61.236 kg)  05/19/13 135 lb (61.236 kg)  05/12/13 135 lb (61.236 kg)  05/11/13 135 lb 2.3 oz (61.3 kg)   Weight appears stable. Expect some weight changes related to fluid.  Pt reports her appetite is normal and is eating well. 50-100% meal completion.   Body mass index is 21.2 kg/(m^2). WNL  Chart reviewed, no nutrition interventions warranted at this time. Please consult as needed.   Clarene Duke RD, LDN Pager [phone] After Hours pager [phone]

## 2013-06-23 NOTE — Progress Notes (Signed)
Subjective:   Had AF this am and felt terrible. Received IV amio and now back in SR. Feels much better. Denies dyspnea.    Intake/Output Summary (Last 24 hours) at 06/23/13 1647 Last data filed at 06/23/13 1300  Gross per 24 hour  Intake    480 ml  Output    701 ml  Net   -221 ml    Current meds: . amiodarone  400 mg Oral BID  . aspirin EC  81 mg Oral Daily  . bisacodyl  10 mg Oral Daily   Or  . bisacodyl  10 mg Rectal Daily  . carvedilol  12.5 mg Oral BID WC  . coumadin book   Does not apply Once  . docusate sodium  100 mg Oral BID  . ferrous sulfate  325 mg Oral BID WC  . insulin aspart  0-24 Units Subcutaneous TID AC & HS  . losartan  100 mg Oral Daily  . magnesium oxide  400 mg Oral BID  . metFORMIN  1,000 mg Oral BID WC  . pantoprazole  40 mg Oral Daily  . pioglitazone  45 mg Oral Daily  . potassium chloride  40 mEq Oral BID  . sodium chloride  3 mL Intravenous Q12H  . Warfarin - Physician Dosing Inpatient   Does not apply q1800   Infusions: . sodium chloride 250 mL (06/16/13 0631)     Objective:  Blood pressure 108/70, pulse 77, temperature 98 F (36.7 C), temperature source Oral, resp. rate 17, height 5\' 6"  (1.676 m), weight 59.557 kg (131 lb 4.8 oz), SpO2 99.00%. Weight change: -0.045 kg (-1.6 oz)  Physical Exam: General:  Sitting on side of bed. Looks good HEENT: normal Neck: supple. JVP flat. Carotids 2+ bilat; no bruits. No lymphadenopathy or thryomegaly appreciated. Cor: Sternum looks good. RRR s2 crisp Lungs: clear decreased at bases Abdomen: soft, nontender, nondistended. No hepatosplenomegaly. No bruits or masses. Good bowel sounds. Extremities: no cyanosis, clubbing, rash, edema Neuro: alert & orientedx3, cranial nerves grossly intact. moves all 4 extremities w/o difficulty. Affect pleasant  Telemetry: Sinus with several hours of AF with RVR overnight  Lab Results: Basic Metabolic Panel:  Recent Labs Lab 06/16/13 1745  06/17/13 0435  06/18/13 0420 06/19/13 0404 06/22/13 0540 06/23/13 1105  NA  --   < > 140 136 139 141 137  K  --   < > 3.8 3.6 3.7 2.5* 4.9  CL  --   < > 106 100 102 96 95*  CO2  --   --  20 20 26  35* 33*  GLUCOSE  --   < > 136* 190* 181* 200* 268*  BUN  --   < > 35* 39* 32* 21 29*  CREATININE 1.07  < > 1.21* 1.19* 0.93 0.86 1.39*  CALCIUM  --   --  8.8 8.9 9.2 9.1 9.0  MG 2.8*  --   --   --   --   --  1.5  < > = values in this interval not displayed. Liver Function Tests: No results found for this basename: AST, ALT, ALKPHOS, BILITOT, PROT, ALBUMIN,  in the last 168 hours No results found for this basename: LIPASE, AMYLASE,  in the last 168 hours No results found for this basename: AMMONIA,  in the last 168 hours CBC:  Recent Labs Lab 06/16/13 1745 06/16/13 1802 06/17/13 0435 06/18/13 0420 06/19/13 0404 06/22/13 0540  WBC 10.8*  --  9.4 10.0 9.4 8.6  HGB 8.7*  8.2* 8.3* 8.5* 9.2* 9.3*  HCT 26.3* 24.0* 24.0* 25.1* 27.7* 27.5*  MCV 92.9  --  93.0 92.3 91.4 92.3  PLT 73*  --  67* 72* 112* 201   Cardiac Enzymes: No results found for this basename: CKTOTAL, CKMB, CKMBINDEX, TROPONINI,  in the last 168 hours BNP: No components found with this basename: POCBNP,  CBG:  Recent Labs Lab 06/22/13 1110 06/22/13 1639 06/22/13 2118 06/23/13 1114 06/23/13 1617  GLUCAP 127* 162* 158* 248* 83   Microbiology: Lab Results  Component Value Date   CULT Multiple bacterial morphotypes present, none predominant. Suggest appropriate recollection if clinically indicated. 05/09/2013   No results found for this basename: CULT, SDES,  in the last 168 hours  Imaging: Dg Chest 2 View  06/22/2013   *RADIOLOGY REPORT*  Clinical Data: Post valve replacements, evaluate CHF  CHEST - 2 VIEW  Comparison: 06/19/2013; 06/17/2013; 06/13/2013  Findings:  Grossly unchanged enlarged cardiac silhouette and mediastinal contours post median sternotomy and aortic and mitral valve replacements.  A largely radiolucent  pacemaker device overlies the left ventricular apex.  Atherosclerotic plaque within the thoracic aorta.  Grossly unchanged small layering bilateral pleural effusions with associated bibasilar heterogeneous / consolidative opacity.  A small amount of fluid is seen in the right minor fissure.  Mild pulmonary congestion without frank evidence of edema.  No pneumothorax.  Grossly unchanged bones.  IMPRESSION: 1.  Grossly stable postsurgical changes of the heart and chest with grossly unchanged small bilateral pleural effusions and associated bibasilar atelectasis. 2.  Pulmonary venous congestion without frank evidence of edema.   Original Report Authenticated By: Tacey Ruiz, MD     ASSESSMENT:  1. AS s/p AVR  2. MR s/p MV repair 3. Chronic systolic HF - stable 4. DM2 5. HTN 6. AF with RVR 7. Acute renal failure  PLAN/DISCUSSION:  Looks good now. Would increase amio to 400 bid while in house then decrease to 200 bid on d/c. Appears dry. Would stop lasix for now. D/w Dr. Cornelius Moras    LOS: 8 days    Arvilla Meres, MD 06/23/2013, 4:47 PM

## 2013-06-23 NOTE — Progress Notes (Addendum)
[address]       [place] [address]             [phone]      8 Days Post-Op  Procedure(s) (LRB): AORTIC VALVE REPLACEMENT (AVR) (N/A) INTRAOPERATIVE TRANSESOPHAGEAL ECHOCARDIOGRAM (N/A) MITRAL VALVE (MV) REPLACEMENT (N/A) EPICARDIAL PACING LEAD PLACEMENT (N/A) Subjective: Feels ok, some fluttering noted  Objective  Telemetry now in afib with RVR  Temp:  [97.4 F (36.3 C)-98.7 F (37.1 C)] 98.1 F (36.7 C) (09/04 0535) Pulse Rate:  [75-78] 75 (09/04 0535) Resp:  [17-18] 18 (09/04 0535) BP: (103-135)/(37-51) 135/37 mmHg (09/04 0535) SpO2:  [90 %-95 %] 90 % (09/04 0535) Weight:  [131 lb 4.8 oz (59.557 kg)] 131 lb 4.8 oz (59.557 kg) (09/04 0535)   Intake/Output Summary (Last 24 hours) at 06/23/13 0732 Last data filed at 06/22/13 2002  Gross per 24 hour  Intake    600 ml  Output    800 ml  Net   -200 ml       General appearance: alert, cooperative and no distress Heart: irregularly irregular rhythm Lungs: min dim in bases Abdomen: benign Extremities: no edema Wound: incis healing well  Lab Results:  Recent Labs  06/22/13 0540  NA 141  K 2.5*  CL 96  CO2 35*  GLUCOSE 200*  BUN 21  CREATININE 0.86  CALCIUM 9.1   No results found for this basename: AST, ALT, ALKPHOS, BILITOT, PROT, ALBUMIN,  in the last 72 hours No results found for this basename: LIPASE, AMYLASE,  in the last 72 hours  Recent Labs  06/22/13 0540  WBC 8.6  HGB 9.3*  HCT 27.5*  MCV 92.3  PLT 201   No results found for this basename: CKTOTAL, CKMB, TROPONINI,  in the last 72 hours No components found with this basename: POCBNP,  No results found for this basename: DDIMER,  in the last 72 hours No results found for this basename: HGBA1C,  in the last 72 hours No results found for this basename: CHOL, HDL, LDLCALC, TRIG, CHOLHDL,  in the last 72 hours No results found for this basename: TSH, T4TOTAL, FREET3, T3FREE, THYROIDAB,  in the last 72 hours No  results found for this basename: VITAMINB12, FOLATE, FERRITIN, TIBC, IRON, RETICCTPCT,  in the last 72 hours  Medications: Scheduled . amiodarone  200 mg Oral BID PC  . aspirin EC  81 mg Oral Daily  . bisacodyl  10 mg Oral Daily   Or  . bisacodyl  10 mg Rectal Daily  . carvedilol  12.5 mg Oral BID WC  . coumadin book   Does not apply Once  . docusate sodium  100 mg Oral BID  . ferrous sulfate  325 mg Oral BID WC  . furosemide  60 mg Oral BID  . insulin aspart  0-24 Units Subcutaneous TID AC & HS  . losartan  100 mg Oral Daily  . metFORMIN  1,000 mg Oral BID WC  . pantoprazole  40 mg Oral Daily  . pioglitazone  45 mg Oral Daily  . potassium chloride  40 mEq Oral BID  . sodium chloride  3 mL Intravenous Q12H  . Warfarin - Physician Dosing Inpatient   Does not apply q1800     Radiology/Studies:  Dg Chest 2 View  06/22/2013   *RADIOLOGY REPORT*  Clinical Data: Post valve replacements, evaluate CHF  CHEST - 2 VIEW  Comparison: 06/19/2013; 06/17/2013; 06/13/2013  Findings:  Grossly unchanged enlarged  cardiac silhouette and mediastinal contours post median sternotomy and aortic and mitral valve replacements.  A largely radiolucent pacemaker device overlies the left ventricular apex.  Atherosclerotic plaque within the thoracic aorta.  Grossly unchanged small layering bilateral pleural effusions with associated bibasilar heterogeneous / consolidative opacity.  A small amount of fluid is seen in the right minor fissure.  Mild pulmonary congestion without frank evidence of edema.  No pneumothorax.  Grossly unchanged bones.  IMPRESSION: 1.  Grossly stable postsurgical changes of the heart and chest with grossly unchanged small bilateral pleural effusions and associated bibasilar atelectasis. 2.  Pulmonary venous congestion without frank evidence of edema.   Original Report Authenticated By: Tacey Ruiz, MD    INR: Will add last result for INR, ABG once components are confirmed Will add last 4 CBG  results once components are confirmed  Assessment/Plan: S/P Procedure(s) (LRB): AORTIC VALVE REPLACEMENT (AVR) (N/A) INTRAOPERATIVE TRANSESOPHAGEAL ECHOCARDIOGRAM (N/A) MITRAL VALVE (MV) REPLACEMENT (N/A) EPICARDIAL PACING LEAD PLACEMENT (N/A)  1 Afib with RVR, will give coreg now, and amio 150 mg iv bolus, QTc ok 2 bmet is pending, will check Mg++, TSH normal on 05/10/13 3 INR pending 4 ? Reduce lasix dose soon  LOS: 8 days    GOLD,WAYNE E 9/4/20147:32 AM  I have seen and examined the patient and agree with the assessment and plan as outlined.  Weight now back to baseline.  BP down and feels crummy now that she's in Afib.  Hold lasix.    Alisse Tuite H 06/23/2013 9:33 AM

## 2013-06-23 NOTE — Progress Notes (Signed)
CARDIAC REHAB PHASE I   PRE:  Rate/Rhythm: 77SR 1HB  BP:  Supine:   Sitting: 110/70  Standing:    SaO2: 94%RA  MODE:  Ambulation: 450 ft   POST:  Rate/Rhythm: 87  BP:  Supine:   Sitting: 108/70  Standing:    SaO2: 99%RA 1332-1400 Pt walked 450 ft on RA with rolling walker and asst x 1. Stopped twice to rest. Tolerated well. Remained in NSR. To recliner after walk.   Luetta Nutting, RN BSN  06/23/2013 2:09 PM

## 2013-06-23 NOTE — Progress Notes (Signed)
CSW left message for [facility] to confirm bed availability when patient is medically ready for dc. CSW awaiting phone call back from admissions at [facility]. CSW will update patient and family.    Maree Krabbe, MSW, [facility]  [phone]

## 2013-06-23 NOTE — Progress Notes (Signed)
Pt went into Afib This Am, confirmed by ekg. Vs stable. Paged dr. Morton Peters. Awaiting call back.

## 2013-06-24 LAB — GLUCOSE, CAPILLARY: Glucose-Capillary: 195 mg/dL — ABNORMAL HIGH (ref 70–99)

## 2013-06-24 LAB — PROTIME-INR: Prothrombin Time: 19.9 seconds — ABNORMAL HIGH (ref 11.6–15.2)

## 2013-06-24 MED ORDER — FUROSEMIDE 40 MG PO TABS
40.0000 mg | ORAL_TABLET | Freq: Every day | ORAL | Status: DC
  Administered 2013-06-24 – 2013-06-27 (×4): 40 mg via ORAL
  Filled 2013-06-24 (×4): qty 1

## 2013-06-24 MED ORDER — WARFARIN SODIUM 4 MG PO TABS
4.0000 mg | ORAL_TABLET | Freq: Once | ORAL | Status: DC
  Filled 2013-06-24: qty 1

## 2013-06-24 MED ORDER — WARFARIN SODIUM 5 MG PO TABS
5.0000 mg | ORAL_TABLET | Freq: Every day | ORAL | Status: DC
  Administered 2013-06-24 – 2013-06-25 (×2): 5 mg via ORAL
  Filled 2013-06-24 (×3): qty 1

## 2013-06-24 MED ORDER — POTASSIUM CHLORIDE ER 10 MEQ PO TBCR
20.0000 meq | EXTENDED_RELEASE_TABLET | Freq: Every day | ORAL | Status: DC
  Administered 2013-06-24 – 2013-06-27 (×4): 20 meq via ORAL
  Filled 2013-06-24 (×4): qty 2

## 2013-06-24 NOTE — Progress Notes (Addendum)
[address]       [place] [address]             [phone]      9 Days Post-Op  Procedure(s) (LRB): AORTIC VALVE REPLACEMENT (AVR) (N/A) INTRAOPERATIVE TRANSESOPHAGEAL ECHOCARDIOGRAM (N/A) MITRAL VALVE (MV) REPLACEMENT (N/A) EPICARDIAL PACING LEAD PLACEMENT (N/A) Subjective: Feels much better  Objective  Telemetry maintaining SR  Temp:  [97.8 F (36.6 C)-98 F (36.7 C)] 97.9 F (36.6 C) (09/05 0345) Pulse Rate:  [71-77] 76 (09/05 0345) Resp:  [17-18] 18 (09/05 0345) BP: (98-145)/(43-70) 145/66 mmHg (09/05 0345) SpO2:  [92 %-99 %] 96 % (09/05 0345) Weight:  [134 lb 11.2 oz (61.1 kg)] 134 lb 11.2 oz (61.1 kg) (09/05 0345)   Intake/Output Summary (Last 24 hours) at 06/24/13 0733 Last data filed at 06/24/13 0347  Gross per 24 hour  Intake    840 ml  Output    501 ml  Net    339 ml       General appearance: alert, cooperative and no distress Heart: regular rate and rhythm and S1, S2 normal Lungs: clear to auscultation bilaterally Abdomen: benign Extremities: no LE edema Wound: incis healing  Lab Results:  Recent Labs  06/22/13 0540 06/23/13 1105  NA 141 137  K 2.5* 4.9  CL 96 95*  CO2 35* 33*  GLUCOSE 200* 268*  BUN 21 29*  CREATININE 0.86 1.39*  CALCIUM 9.1 9.0  MG  --  1.5   No results found for this basename: AST, ALT, ALKPHOS, BILITOT, PROT, ALBUMIN,  in the last 72 hours No results found for this basename: LIPASE, AMYLASE,  in the last 72 hours  Recent Labs  06/22/13 0540  WBC 8.6  HGB 9.3*  HCT 27.5*  MCV 92.3  PLT 201   No results found for this basename: CKTOTAL, CKMB, TROPONINI,  in the last 72 hours No components found with this basename: POCBNP,  No results found for this basename: DDIMER,  in the last 72 hours No results found for this basename: HGBA1C,  in the last 72 hours No results found for this basename: CHOL, HDL, LDLCALC, TRIG, CHOLHDL,  in the last 72 hours No results found for this basename:  TSH, T4TOTAL, FREET3, T3FREE, THYROIDAB,  in the last 72 hours No results found for this basename: VITAMINB12, FOLATE, FERRITIN, TIBC, IRON, RETICCTPCT,  in the last 72 hours  Medications: Scheduled . amiodarone  400 mg Oral BID  . aspirin EC  81 mg Oral Daily  . bisacodyl  10 mg Oral Daily   Or  . bisacodyl  10 mg Rectal Daily  . carvedilol  12.5 mg Oral BID WC  . coumadin book   Does not apply Once  . docusate sodium  100 mg Oral BID  . ferrous sulfate  325 mg Oral BID WC  . insulin aspart  0-24 Units Subcutaneous TID AC & HS  . losartan  100 mg Oral Daily  . magnesium oxide  400 mg Oral BID  . metFORMIN  1,000 mg Oral BID WC  . pantoprazole  40 mg Oral Daily  . pioglitazone  45 mg Oral Daily  . potassium chloride  40 mEq Oral BID  . sodium chloride  3 mL Intravenous Q12H  . Warfarin - Physician Dosing Inpatient   Does not apply q1800     Radiology/Studies:  No results found.  INR:1.75 Will add last result for INR, ABG once components are confirmed Will  add last 4 CBG results once components are confirmed  Assessment/Plan: S/P Procedure(s) (LRB): AORTIC VALVE REPLACEMENT (AVR) (N/A) INTRAOPERATIVE TRANSESOPHAGEAL ECHOCARDIOGRAM (N/A) MITRAL VALVE (MV) REPLACEMENT (N/A) EPICARDIAL PACING LEAD PLACEMENT (N/A)  1 conts to do well- much better back in SR, SW waiting on call from [facility]. 2 replacing MG++ 3 cont coumadin   LOS: 9 days    GOLD,WAYNE E 9/5/20147:33 AM  I have seen and examined the patient and agree with the assessment and plan as outlined.  OWEN,CLARENCE H 06/24/2013 9:55 AM

## 2013-06-24 NOTE — Progress Notes (Signed)
CSW spoke to MD about possible dc Monday. CSW spoke to [facility] [employer] [facility] who states that they " should be fine for Monday dc." Admissions stated they would call if anything changed. CSW updated patient and family. CSW will update when new information arises.  Maree Krabbe, MSW, [facility] [phone]

## 2013-06-24 NOTE — Progress Notes (Signed)
  Much improved. Maintaining SR. Continue amio 400 bid while in house. 200 bid at discharge.  Check Bmet today to f/u on renal function.   Finding right lasix dose at d/c will be a bit of a challenge. Would probably start with 40mg  po daily.   Linh Johannes,MD 10:13 AM

## 2013-06-24 NOTE — Progress Notes (Signed)
CARDIAC REHAB PHASE I   PRE:  Rate/Rhythm: 66SR  BP:  Supine:   Sitting: 130/62  Standing:    SaO2: 93%RA  MODE:  Ambulation: 400 ft   POST:  Rate/Rhythm: 73  BP:  Supine:   Sitting: 120/76  Standing:    SaO2: 100%RA 1000-1047 Pt walked 400 ft on RA with rolling walker and asst x 1. Stopped three times to rest due to leg weakness and legs tired. To recliner after walk. Daughter in room. Education completed with daughter and pt. Understanding voiced. Discussed CRP 2 and permission given to refer to [facility] Phase 2. Pt weighs self daily and knows when to call MD with weight gain,. Follows low sodium diet.   Luetta Nutting, RN BSN  06/24/2013 10:44 AM

## 2013-06-24 NOTE — Progress Notes (Signed)
      [address]       [name] [address]             [phone]     CARDIOTHORACIC SURGERY PROGRESS NOTE  9 Days Post-Op  S/P Procedure(s) (LRB): AORTIC VALVE REPLACEMENT (AVR) (N/A) INTRAOPERATIVE TRANSESOPHAGEAL ECHOCARDIOGRAM (N/A) MITRAL VALVE (MV) REPLACEMENT (N/A) EPICARDIAL PACING LEAD PLACEMENT (N/A)  Subjective: Doing well.  Appetite pretty good.  Ambulating well.  Feels much better back in sinus rhythm  Objective: Vital signs in last 24 hours: Temp:  [97.8 F (36.6 C)-98 F (36.7 C)] 97.9 F (36.6 C) (09/05 0345) Pulse Rate:  [71-77] 76 (09/05 0345) Cardiac Rhythm:  [-] Heart block (09/05 0855) Resp:  [17-18] 18 (09/05 0345) BP: (98-145)/(43-70) 145/66 mmHg (09/05 0345) SpO2:  [92 %-99 %] 96 % (09/05 0345) Weight:  [61.1 kg (134 lb 11.2 oz)] 61.1 kg (134 lb 11.2 oz) (09/05 0345)  Physical Exam:  Rhythm:   sinus  Breath sounds: clear  Heart sounds:  RRR w/out murmur  Incisions:  Clean and dry  Abdomen:  Soft, non-distended, non-tender  Extremities:  Warm, well-perfused    Intake/Output from previous day: 09/04 0701 - 09/05 0700 In: 1080 [P.O.:1080] Out: 601 [Urine:600; Stool:1] Intake/Output this shift:    Lab Results:  Recent Labs  06/22/13 0540  WBC 8.6  HGB 9.3*  HCT 27.5*  PLT 201   BMET:  Recent Labs  06/22/13 0540 06/23/13 1105  NA 141 137  K 2.5* 4.9  CL 96 95*  CO2 35* 33*  GLUCOSE 200* 268*  BUN 21 29*  CREATININE 0.86 1.39*  CALCIUM 9.1 9.0    CBG (last 3)   Recent Labs  06/23/13 1617 06/23/13 2113 06/24/13 0608  GLUCAP 83 176* 147*   PT/INR:   Recent Labs  06/24/13 0635  LABPROT 19.9*  INR 1.75*    CXR:  N/A  Assessment/Plan: S/P Procedure(s) (LRB): AORTIC VALVE REPLACEMENT (AVR) (N/A) INTRAOPERATIVE TRANSESOPHAGEAL ECHOCARDIOGRAM (N/A) MITRAL VALVE (MV) REPLACEMENT (N/A) EPICARDIAL PACING LEAD PLACEMENT (N/A)  Overall making progress Maintaining NSR Hypertension under adequate  control Expected post op volume excess, weight now just slightly above baseline Post op Afib, maintaining NSR on amiodarone, metoprolol and coumadin   Restart low dose lasix and potassium  Continue coumadin 5 mg daily for now  Anticipate d/c to SNF on Monday   Joanna [name] H 06/24/2013 10:49 AM

## 2013-06-25 LAB — PROTIME-INR: Prothrombin Time: 23.9 seconds — ABNORMAL HIGH (ref 11.6–15.2)

## 2013-06-25 LAB — GLUCOSE, CAPILLARY
Glucose-Capillary: 158 mg/dL — ABNORMAL HIGH (ref 70–99)
Glucose-Capillary: 160 mg/dL — ABNORMAL HIGH (ref 70–99)

## 2013-06-25 LAB — BASIC METABOLIC PANEL
CO2: 33 mEq/L — ABNORMAL HIGH (ref 19–32)
Chloride: 96 mEq/L (ref 96–112)
Sodium: 139 mEq/L (ref 135–145)

## 2013-06-25 NOTE — Progress Notes (Signed)
CARDIAC REHAB PHASE I   PRE:  Rate/Rhythm: 68 sinus  BP:  Sitting: 112/66     SaO2: 98 RA  MODE:  Ambulation: 450 ft   POST:  Rate/Rhythem: 78 sinus  BP:  Sitting: 124/66     SaO2: 99 RA  Pt ambulated 450 ft with assist x1 using rolling walker.  Pt also had son walk with [place] as well.  Pt tolerated walk well but did complain of feeling fatigued.  She took three standing rest breaks.  Pt returned to chair after walk with call bell in reach.  Pt had no f/u questions from yesterday's education.  We will follow up on Monday. Fabio Pierce, MA, ACSM RCEP [phone]  Hazle Nordmann

## 2013-06-25 NOTE — Progress Notes (Addendum)
[address]       [place] [address]             [phone]      10 Days Post-Op  Procedure(s) (LRB): AORTIC VALVE REPLACEMENT (AVR) (N/A) INTRAOPERATIVE TRANSESOPHAGEAL ECHOCARDIOGRAM (N/A) MITRAL VALVE (MV) REPLACEMENT (N/A) EPICARDIAL PACING LEAD PLACEMENT (N/A) Subjective: Feels a little SOB this am, otherwise ok  Objective  Telemetry sinus rhythm  Temp:  [97.7 F (36.5 C)-98.3 F (36.8 C)] 97.7 F (36.5 C) (09/06 0423) Pulse Rate:  [73-78] 78 (09/06 0423) Resp:  [18] 18 (09/06 0423) BP: (115-156)/(54-68) 156/68 mmHg (09/06 0423) SpO2:  [95 %-97 %] 97 % (09/06 0423) Weight:  [132 lb 1.6 oz (59.92 kg)] 132 lb 1.6 oz (59.92 kg) (09/06 0423)   Intake/Output Summary (Last 24 hours) at 06/25/13 0726 Last data filed at 06/24/13 2254  Gross per 24 hour  Intake    480 ml  Output   1401 ml  Net   -921 ml       General appearance: alert, cooperative and no distress Heart: regular rate and rhythm Lungs: mildly dim in bases Abdomen: benign Extremities: no LE edema Wound: incis healing well  Lab Results:  Recent Labs  06/23/13 1105 06/25/13 0530  NA 137 139  K 4.9 4.7  CL 95* 96  CO2 33* 33*  GLUCOSE 268* 186*  BUN 29* 36*  CREATININE 1.39* 1.17*  CALCIUM 9.0 9.8  MG 1.5  --    No results found for this basename: AST, ALT, ALKPHOS, BILITOT, PROT, ALBUMIN,  in the last 72 hours No results found for this basename: LIPASE, AMYLASE,  in the last 72 hours No results found for this basename: WBC, NEUTROABS, HGB, HCT, MCV, PLT,  in the last 72 hours No results found for this basename: CKTOTAL, CKMB, TROPONINI,  in the last 72 hours No components found with this basename: POCBNP,  No results found for this basename: DDIMER,  in the last 72 hours No results found for this basename: HGBA1C,  in the last 72 hours No results found for this basename: CHOL, HDL, LDLCALC, TRIG, CHOLHDL,  in the last 72 hours No results found for this basename:  TSH, T4TOTAL, FREET3, T3FREE, THYROIDAB,  in the last 72 hours No results found for this basename: VITAMINB12, FOLATE, FERRITIN, TIBC, IRON, RETICCTPCT,  in the last 72 hours  Medications: Scheduled . amiodarone  400 mg Oral BID  . aspirin EC  81 mg Oral Daily  . bisacodyl  10 mg Oral Daily   Or  . bisacodyl  10 mg Rectal Daily  . carvedilol  12.5 mg Oral BID WC  . coumadin book   Does not apply Once  . docusate sodium  100 mg Oral BID  . ferrous sulfate  325 mg Oral BID WC  . furosemide  40 mg Oral Daily  . insulin aspart  0-24 Units Subcutaneous TID AC & HS  . losartan  100 mg Oral Daily  . magnesium oxide  400 mg Oral BID  . metFORMIN  1,000 mg Oral BID WC  . pantoprazole  40 mg Oral Daily  . pioglitazone  45 mg Oral Daily  . potassium chloride  20 mEq Oral Daily  . sodium chloride  3 mL Intravenous Q12H  . warfarin  5 mg Oral q1800  . Warfarin - Physician Dosing Inpatient   Does not apply q1800     Radiology/Studies:  No results found.  INR:2.22 Will  add last result for INR, ABG once components are confirmed Will add last 4 CBG results once components are confirmed  Assessment/Plan: S/P Procedure(s) (LRB): AORTIC VALVE REPLACEMENT (AVR) (N/A) INTRAOPERATIVE TRANSESOPHAGEAL ECHOCARDIOGRAM (N/A) MITRAL VALVE (MV) REPLACEMENT (N/A) EPICARDIAL PACING LEAD PLACEMENT (N/A)  1 Looks well on current RX/TX- cont to push rehab and pulm toilet. For SNF Monday if bed available 2 monitor BP - keep current rx for now 3 will decrease amio on d/c per cardiol instructions   LOS: 10 days    GOLD,WAYNE E 9/6/20147:26 AM   Chart reviewed, patient examined, agree with above.

## 2013-06-26 LAB — GLUCOSE, CAPILLARY
Glucose-Capillary: 161 mg/dL — ABNORMAL HIGH (ref 70–99)
Glucose-Capillary: 164 mg/dL — ABNORMAL HIGH (ref 70–99)

## 2013-06-26 MED ORDER — WARFARIN SODIUM 2.5 MG PO TABS
2.5000 mg | ORAL_TABLET | Freq: Every day | ORAL | Status: DC
  Administered 2013-06-26: 2.5 mg via ORAL
  Filled 2013-06-26 (×2): qty 1

## 2013-06-26 NOTE — Progress Notes (Signed)
Ambulated 150 ft in hallway using walker. Stopped x3 d/t shortness of breath; O2 sat remained 95-97% the entire walk. Returned to bedside chair in room. Call bell near. Will continue to monitor.Joanna [name]

## 2013-06-26 NOTE — Progress Notes (Signed)
Ambulated 200 ft in hallway using walker with family. Patient stopped x2 d/t shortness of breath. Returned to bedside chair in room. Call bell and family near.Joanna [name]

## 2013-06-26 NOTE — Progress Notes (Addendum)
[name]       [place] [address]             [phone]      11 Days Post-Op  Procedure(s) (LRB): AORTIC VALVE REPLACEMENT (AVR) (N/A) INTRAOPERATIVE TRANSESOPHAGEAL ECHOCARDIOGRAM (N/A) MITRAL VALVE (MV) REPLACEMENT (N/A) EPICARDIAL PACING LEAD PLACEMENT (N/A) Subjective: Cont to feel well  Objective  Telemetry sinus, ? Long pause vs lead placement  Temp:  [97.6 F (36.4 C)-98.3 F (36.8 C)] 97.6 F (36.4 C) (09/07 0451) Pulse Rate:  [67-68] 67 (09/07 0451) Resp:  [20] 20 (09/07 0451) BP: (124-130)/(48-66) 128/48 mmHg (09/07 0451) SpO2:  [93 %-99 %] 93 % (09/07 0451) Weight:  [131 lb 11.2 oz (59.739 kg)] 131 lb 11.2 oz (59.739 kg) (09/07 0451)   Intake/Output Summary (Last 24 hours) at 06/26/13 0746 Last data filed at 06/26/13 0300  Gross per 24 hour  Intake    240 ml  Output   1250 ml  Net  -1010 ml       General appearance: alert, cooperative and no distress Heart: regular rate and rhythm and soft syst murmur Lungs: clear to auscultation bilaterally Abdomen: benign Extremities: min edema Wound: incis healing well  Lab Results:  Recent Labs  06/23/13 1105 06/25/13 0530  NA 137 139  K 4.9 4.7  CL 95* 96  CO2 33* 33*  GLUCOSE 268* 186*  BUN 29* 36*  CREATININE 1.39* 1.17*  CALCIUM 9.0 9.8  MG 1.5  --    No results found for this basename: AST, ALT, ALKPHOS, BILITOT, PROT, ALBUMIN,  in the last 72 hours No results found for this basename: LIPASE, AMYLASE,  in the last 72 hours No results found for this basename: WBC, NEUTROABS, HGB, HCT, MCV, PLT,  in the last 72 hours No results found for this basename: CKTOTAL, CKMB, TROPONINI,  in the last 72 hours No components found with this basename: POCBNP,  No results found for this basename: DDIMER,  in the last 72 hours No results found for this basename: HGBA1C,  in the last 72 hours No results found for this basename: CHOL, HDL, LDLCALC, TRIG, CHOLHDL,  in the last 72  hours No results found for this basename: TSH, T4TOTAL, FREET3, T3FREE, THYROIDAB,  in the last 72 hours No results found for this basename: VITAMINB12, FOLATE, FERRITIN, TIBC, IRON, RETICCTPCT,  in the last 72 hours  Medications: Scheduled . amiodarone  400 mg Oral BID  . aspirin EC  81 mg Oral Daily  . bisacodyl  10 mg Oral Daily   Or  . bisacodyl  10 mg Rectal Daily  . carvedilol  12.5 mg Oral BID WC  . coumadin book   Does not apply Once  . docusate sodium  100 mg Oral BID  . ferrous sulfate  325 mg Oral BID WC  . furosemide  40 mg Oral Daily  . insulin aspart  0-24 Units Subcutaneous TID AC & HS  . losartan  100 mg Oral Daily  . metFORMIN  1,000 mg Oral BID WC  . pantoprazole  40 mg Oral Daily  . pioglitazone  45 mg Oral Daily  . potassium chloride  20 mEq Oral Daily  . sodium chloride  3 mL Intravenous Q12H  . warfarin  5 mg Oral q1800  . Warfarin - Physician Dosing Inpatient   Does not apply q1800     Radiology/Studies:  No results found.  INR:2.57 Will add last result for INR, ABG  once components are confirmed Will add last 4 CBG results once components are confirmed  Assessment/Plan: S/P Procedure(s) (LRB): AORTIC VALVE REPLACEMENT (AVR) (N/A) INTRAOPERATIVE TRANSESOPHAGEAL ECHOCARDIOGRAM (N/A) MITRAL VALVE (MV) REPLACEMENT (N/A) EPICARDIAL PACING LEAD PLACEMENT (N/A)  1 conts to do well 2 BP controlled 3 decrease coumadin dose 4 sugars controled 5 SNF when abed avail   LOS: 11 days    Joanna [name],Joanna [name] 9/7/20147:46 AM   Chart reviewed, patient examined, agree with above. To SNF tomorrow if bed available. At 79 years old on amiodarone she is only going to need 2.5 mg per day with INR follow up later in the week.

## 2013-06-27 DIAGNOSIS — I5022 Chronic systolic (congestive) heart failure: Secondary | ICD-10-CM

## 2013-06-27 DIAGNOSIS — Z952 Presence of prosthetic heart valve: Secondary | ICD-10-CM

## 2013-06-27 MED ORDER — FUROSEMIDE 40 MG PO TABS: 40.0000 mg | ORAL_TABLET | Freq: Every day | ORAL | Status: DC

## 2013-06-27 MED ORDER — AMIODARONE HCL 400 MG PO TABS: 200.0000 mg | ORAL_TABLET | Freq: Two times a day (BID) | ORAL | Status: DC

## 2013-06-27 MED ORDER — POTASSIUM CHLORIDE ER 20 MEQ PO TBCR: 20.0000 meq | EXTENDED_RELEASE_TABLET | Freq: Every day | ORAL | Status: DC

## 2013-06-27 MED ORDER — TRAMADOL HCL 50 MG PO TABS: 50.0000 mg | ORAL_TABLET | Freq: Four times a day (QID) | ORAL | Status: DC | PRN

## 2013-06-27 MED ORDER — WARFARIN 1.25 MG HALF TABLET: 1.2500 mg | ORAL_TABLET | Freq: Every day | ORAL | Status: DC

## 2013-06-27 MED ORDER — AMIODARONE HCL 200 MG PO TABS
200.0000 mg | ORAL_TABLET | Freq: Two times a day (BID) | ORAL | Status: DC
  Administered 2013-06-27: 200 mg via ORAL
  Filled 2013-06-27 (×2): qty 1

## 2013-06-27 NOTE — Progress Notes (Signed)
Clinical Social Work Department CLINICAL SOCIAL WORK PLACEMENT NOTE 06/27/2013  Patient:  Joanna [name], Joanna [name]  Account Number:  [id number] Admit date:  06/15/2013  Clinical Social Worker:  Carren Rang  Date/time:  06/20/2013 03:28 PM  Clinical Social Work is seeking post-discharge placement for this patient at the following level of care:   SKILLED NURSING   (*CSW will update this form in Epic as items are completed)   06/20/2013  Patient/family provided with [facility] Department of Clinical Social Work's list of facilities offering this level of care within the geographic area requested by the patient (or if unable, by the patient's family).  06/20/2013  Patient/family informed of their freedom to choose among providers that offer the needed level of care, that participate in Medicare, Medicaid or managed care program needed by the patient, have an available bed and are willing to accept the patient.  06/20/2013  Patient/family informed of [facility]' ownership interest in [facility], as well as of the fact that they are under no obligation to receive care at this facility.  PASARR submitted to EDS on 06/20/2013 PASARR number received from EDS on 06/20/2013  FL2 transmitted to all facilities in geographic area requested by pt/family on  06/20/2013 FL2 transmitted to all facilities within larger geographic area on   Patient informed that his/her managed care company has contracts with or will negotiate with  certain facilities, including the following:     Patient/family informed of bed offers received:  06/21/2013 Patient chooses bed at [facility], [place] Physician recommends and patient chooses bed at    [facility] [place] on  06/27/2013 Patient to be transferred to facility by family  The following physician request were entered in Epic:   Additional Comments:  Maree Krabbe, MSW,  [employer] [phone]

## 2013-06-27 NOTE — Progress Notes (Addendum)
[address]       [place] [address]             [phone]      12 Days Post-Op  Procedure(s) (LRB): AORTIC VALVE REPLACEMENT (AVR) (N/A) INTRAOPERATIVE TRANSESOPHAGEAL ECHOCARDIOGRAM (N/A) MITRAL VALVE (MV) REPLACEMENT (N/A) EPICARDIAL PACING LEAD PLACEMENT (N/A) Subjective: conts to feel good, no new issues  Objective  Telemetry sinus rhythm  Temp:  [98.5 F (36.9 C)-98.6 F (37 C)] 98.5 F (36.9 C) (09/08 0347) Pulse Rate:  [66-77] 77 (09/08 0347) Resp:  [16-18] 18 (09/08 0347) BP: (126-173)/(57-69) 173/69 mmHg (09/08 0347) SpO2:  [91 %-98 %] 97 % (09/08 0347) Weight:  [132 lb 6.4 oz (60.056 kg)] 132 lb 6.4 oz (60.056 kg) (09/08 0347)   Intake/Output Summary (Last 24 hours) at 06/27/13 0736 Last data filed at 06/27/13 0657  Gross per 24 hour  Intake    600 ml  Output   1051 ml  Net   -451 ml       General appearance: alert, cooperative and no distress Heart: regular rate and rhythm Lungs: dim in bases Abdomen: benign Extremities: no edema Wound: incisions healing well  Lab Results:  Recent Labs  06/25/13 0530  NA 139  K 4.7  CL 96  CO2 33*  GLUCOSE 186*  BUN 36*  CREATININE 1.17*  CALCIUM 9.8   No results found for this basename: AST, ALT, ALKPHOS, BILITOT, PROT, ALBUMIN,  in the last 72 hours No results found for this basename: LIPASE, AMYLASE,  in the last 72 hours No results found for this basename: WBC, NEUTROABS, HGB, HCT, MCV, PLT,  in the last 72 hours No results found for this basename: CKTOTAL, CKMB, TROPONINI,  in the last 72 hours No components found with this basename: POCBNP,  No results found for this basename: DDIMER,  in the last 72 hours No results found for this basename: HGBA1C,  in the last 72 hours No results found for this basename: CHOL, HDL, LDLCALC, TRIG, CHOLHDL,  in the last 72 hours No results found for this basename: TSH, T4TOTAL, FREET3, T3FREE, THYROIDAB,  in the last 72 hours No  results found for this basename: VITAMINB12, FOLATE, FERRITIN, TIBC, IRON, RETICCTPCT,  in the last 72 hours  Medications: Scheduled . amiodarone  400 mg Oral BID  . aspirin EC  81 mg Oral Daily  . bisacodyl  10 mg Oral Daily   Or  . bisacodyl  10 mg Rectal Daily  . carvedilol  12.5 mg Oral BID WC  . coumadin book   Does not apply Once  . docusate sodium  100 mg Oral BID  . ferrous sulfate  325 mg Oral BID WC  . furosemide  40 mg Oral Daily  . insulin aspart  0-24 Units Subcutaneous TID AC & HS  . losartan  100 mg Oral Daily  . metFORMIN  1,000 mg Oral BID WC  . pantoprazole  40 mg Oral Daily  . pioglitazone  45 mg Oral Daily  . potassium chloride  20 mEq Oral Daily  . sodium chloride  3 mL Intravenous Q12H  . warfarin  2.5 mg Oral q1800  . Warfarin - Physician Dosing Inpatient   Does not apply q1800     Radiology/Studies:  No results found.  INR:3.02 Will add last result for INR, ABG once components are confirmed Will add last 4 CBG results once components are confirmed  Assessment/Plan: S/P Procedure(s) (LRB): AORTIC VALVE REPLACEMENT (  AVR) (N/A) INTRAOPERATIVE TRANSESOPHAGEAL ECHOCARDIOGRAM (N/A) MITRAL VALVE (MV) REPLACEMENT (N/A) EPICARDIAL PACING LEAD PLACEMENT (N/A)  1 doing well, to SNF if bed avail. , decrease coumadin to 1.25 mg     LOS: 12 days    GOLD,WAYNE E 9/8/20147:36 AM  I have seen and examined the patient and agree with the assessment and plan as outlined.  Decrease amiodarone to 200 mg bid.  Ready for d/c to SNF.  D/C on coumadin 2.5 mg daily  Areana Kosanke H 06/27/2013 8:34 AM

## 2013-06-27 NOTE — Progress Notes (Signed)
Called report to [facility]. Pt is stable and ready for discharge with her daughter. Will give pt's daughter nursing home packet to deliver to facility.

## 2013-06-27 NOTE — Progress Notes (Signed)
Subjective:  Maintaining SR. On amio 200 mg bid.   Denies SOB/Orthopnea. Mild incisional chest discomfort. Weigh stable. Plan for SNF today.    Intake/Output Summary (Last 24 hours) at 06/27/13 1005 Last data filed at 06/27/13 0800  Gross per 24 hour  Intake    720 ml  Output    851 ml  Net   -131 ml    Current meds: . amiodarone  200 mg Oral BID  . aspirin EC  81 mg Oral Daily  . bisacodyl  10 mg Oral Daily   Or  . bisacodyl  10 mg Rectal Daily  . carvedilol  12.5 mg Oral BID WC  . coumadin book   Does not apply Once  . docusate sodium  100 mg Oral BID  . ferrous sulfate  325 mg Oral BID WC  . furosemide  40 mg Oral Daily  . insulin aspart  0-24 Units Subcutaneous TID AC & HS  . losartan  100 mg Oral Daily  . metFORMIN  1,000 mg Oral BID WC  . pantoprazole  40 mg Oral Daily  . pioglitazone  45 mg Oral Daily  . potassium chloride  20 mEq Oral Daily  . sodium chloride  3 mL Intravenous Q12H  . warfarin  2.5 mg Oral q1800  . Warfarin - Physician Dosing Inpatient   Does not apply q1800   Infusions: . sodium chloride 250 mL (06/16/13 0631)     Objective:  Blood pressure 102/53, pulse 77, temperature 98.5 F (36.9 C), temperature source Oral, resp. rate 18, height 5\' 6"  (1.676 m), weight 60.056 kg (132 lb 6.4 oz), SpO2 97.00%. Weight change: 0.318 kg (11.2 oz)  Physical Exam: General:  Sitting in chair. NAD. Daughter at bedside HEENT: normal Neck: supple. JVP 8-9 Carotids 2+ bilat; no bruits. No lymphadenopathy or thryomegaly appreciated. Cor: Sternum incision approximated. RRR s2 crisp Lungs: clear decreased at bases Abdomen: soft, nontender, nondistended. No hepatosplenomegaly. No bruits or masses. Good bowel sounds. Extremities: no cyanosis, clubbing, rash, R and LLE trace edema Neuro: alert & orientedx3, cranial nerves grossly intact. moves all 4 extremities w/o difficulty. Affect pleasant  Telemetry: SR 60s  Lab Results: Basic Metabolic Panel:  Recent  Labs Lab 06/22/13 0540 06/23/13 1105 06/25/13 0530  NA 141 137 139  K 2.5* 4.9 4.7  CL 96 95* 96  CO2 35* 33* 33*  GLUCOSE 200* 268* 186*  BUN 21 29* 36*  CREATININE 0.86 1.39* 1.17*  CALCIUM 9.1 9.0 9.8  MG  --  1.5  --    Liver Function Tests: No results found for this basename: AST, ALT, ALKPHOS, BILITOT, PROT, ALBUMIN,  in the last 168 hours No results found for this basename: LIPASE, AMYLASE,  in the last 168 hours No results found for this basename: AMMONIA,  in the last 168 hours CBC:  Recent Labs Lab 06/22/13 0540  WBC 8.6  HGB 9.3*  HCT 27.5*  MCV 92.3  PLT 201   Cardiac Enzymes: No results found for this basename: CKTOTAL, CKMB, CKMBINDEX, TROPONINI,  in the last 168 hours BNP: No components found with this basename: POCBNP,  CBG:  Recent Labs Lab 06/26/13 0623 06/26/13 1113 06/26/13 1618 06/26/13 2114 06/27/13 0625  GLUCAP 161* 138* 164* 145* 157*   Microbiology: Lab Results  Component Value Date   CULT Multiple bacterial morphotypes present, none predominant. Suggest appropriate recollection if clinically indicated. 05/09/2013   No results found for this basename: CULT, SDES,  in the last 168  hours  Imaging: No results found.   ASSESSMENT:  1. AS s/p AVR  2. MR s/p MV repair 3. Chronic systolic HF - stable 4. DM2 5. HTN 6. AF with RVR 7. Acute renal failure  PLAN/DISCUSSION:  Doing well.  Volume status minimally elevated. Continue lasix 40 mg daily. Will need daily weights SNF. Would give extra 40 lasix for weight 135 or greater. Follow up HF clinic 07/12/13 at 2:00  Maintaining SR. Continue amiodarone 200 mg bid.     LOS: 12 days    Joanna Meres, MD 06/27/2013, 10:05 AM

## 2013-06-27 NOTE — Progress Notes (Signed)
Clinical Social Worker facilitated patient discharge by contacting the patient, family and facility,[facility] [place]. Patient agreeable to this plan and arranging transport via family . CSW will sign off, as social work intervention is no longer needed.  Maree Krabbe, MSW, Theresia Majors [phone]

## 2013-06-29 NOTE — Discharge Summary (Signed)
Advanced Heart Failure Team  Discharge Summary    Patient ID: Joanna [name]  MRN: [id number], DOB/AGE: 1930-09-05 79 y.o.  Admit date: 05/09/2013 D/C date: 05/12/2013  Primary Discharge Diagnoses:  1) Acute Pulmonary edema  2) A/C systolic HF  Secondary Discharge Diagnoses:  1) NICM, EF 30%  2) Carotid Stenosis  3) Aortic Stenosis  4) Hyperlipidemia  5) Hypokalemia  6) DM2  Hospital Course:  Joanna [name] is a 79 year old woman with aortic stenosis, hypertension, diabetes mellitus type II, hyperlipidemia, and peripheral artery disease.  She is followed closely in the HF clinic. She presented to the clinic on 05/09/13 and was doing well until she got up to leave and suddenly developed diaphoresis and severe dyspnea. She did not have any CP. Her O2 sats dropped into the 70's, but quickly returned to 90s with O2. Her SBP was 160's during the episode. ECG: NSR, LBBB. She was transferred to the ED. Chest x-ray showed diffuse bilateral pulmonary edema and she was started on Bipap, was given 20 IV lasix, and started on nitroglycerin gtt. Pertinent labs BNP 4292, K+ 5.3 and Troponin 1.72.  During her stay she was given IV lasix and then transitioned to PO lasix 60 mg BID. She had a net loss of 2.2 liters and reported that she no longer had any SOB. Her flash pulmonary was felt to be d/t low gradient AS and she had a low dose dobutamine stress ECHO performed during her stay revealing Low gradient severe AS. She has an appointment with Dr. Cornelius Moras CVTS surgeon tomorrow to evaluate whether she would be a candidate for AVR or TAVR.  Her Cr remained stable with diuresis and today was 0.93 and her K+ was 4.2. We will continue to follow her closely in the HF clinic and instructed patient to call if she had any weight gain or SOB.  Discharge Weight Range: 135-138 lbs  Discharge Vitals: There were no vitals taken for this visit.  Labs:  Lab Results   Component  Value  Date    WBC  7.2  05/11/2013    HGB  11.3*   05/11/2013    HCT  34.3*  05/11/2013    MCV  93.0  05/11/2013    PLT  165  05/11/2013     Recent Labs  Lab  05/19/13 0925   NA  139   K  4.4   CL  100   CO2  30   BUN  38*   CREATININE  0.95   CALCIUM  11.2*   GLUCOSE  210*    Lab Results   Component  Value  Date    CHOL  207*  11/15/2012    HDL  66.00  11/15/2012    LDLCALC  75  11/15/2010    TRIG  138.0  11/15/2012    BNP (last 3 results)   Recent Labs   05/09/13 1707   PROBNP  4292.0*    Diagnostic Studies/Procedures   No results found.  Discharge Medications      Medication List     ASK your doctor about these medications       aspirin EC 81 MG tablet    Take 81 mg by mouth daily.    calcium citrate-vitamin D 315-200 MG-UNIT per tablet    Commonly known as: CITRACAL+D    Take 2 tablets by mouth 2 (two) times daily.    carvedilol 6.25 MG tablet    Commonly known as: COREG  Take 12.5 mg by mouth 2 (two) times daily with a meal. Morning and afternoon    cholecalciferol 1000 UNITS tablet    Commonly known as: VITAMIN D    Take 2,000 Units by mouth daily.    docusate sodium 100 MG capsule    Commonly known as: COLACE    Take 100 mg by mouth 2 (two) times daily.    ferrous sulfate 325 (65 FE) MG tablet    Take 325 mg by mouth 2 (two) times daily with a meal.    fish oil-omega-3 fatty acids 1000 MG capsule    Take 1 g by mouth 2 (two) times daily.    furosemide 20 MG tablet    Commonly known as: LASIX    Take 3 tablets (60 mg total) by mouth 2 (two) times daily.    Garlic 1000 MG Caps    Take 1 capsule by mouth daily.    losartan 100 MG tablet    Commonly known as: COZAAR    Take 100 mg by mouth daily.    metFORMIN 1000 MG tablet    Commonly known as: GLUCOPHAGE    Take 1,000 mg by mouth 2 (two) times daily with a meal.    pioglitazone 45 MG tablet    Commonly known as: ACTOS    Take 45 mg by mouth daily.    simvastatin 80 MG tablet    Commonly known as: ZOCOR    Take 80 mg by mouth at bedtime.     triamcinolone cream 0.1 %    Commonly known as: KENALOG    Apply 1 application topically 2 (two) times daily as needed (for eczema).      Disposition    The patient will be discharged in stable condition to home.    Future Appointments  Provider  Department  Dept Phone    06/02/2013 11:40 AM  [facility]  [facility]  [phone]    06/13/2013 11:00 AM  [facility]  [facility] V[facility]ULAR LABORAT[facility]  [phone]    06/13/2013 12:00 PM  [facility]  [facility] RESPIRATORY THERAPY  [phone]    06/13/2013 1:00 PM  [facility] [address]  [facility] SAME DAY SURGERY  [phone]    06/13/2013 2:15 PM  Purcell Nails, MD  [facility]  [phone]        Duration of Discharge Encounter: Greater than 35 minutes  Signed,  [name]  05/20/2013, 8:00 AM

## 2013-06-30 NOTE — Discharge Summary (Signed)
Advanced Heart Failure Team  Discharge Summary    Patient ID: Joanna [name]  MRN: [id number], DOB/AGE: 05/21/1930 79 y.o.  Admit date: 05/09/2013 D/C date: 05/12/2013  Primary Discharge Diagnoses:  1) Acute Pulmonary edema  2) A/C systolic HF  Secondary Discharge Diagnoses:  1) NICM, EF 30%  2) Carotid Stenosis  3) Aortic Stenosis  4) Hyperlipidemia  5) Hypokalemia  6) DM2  Hospital Course:  Ms. Schwartzkopf is a 79 year old woman with aortic stenosis, hypertension, diabetes mellitus type II, hyperlipidemia, and peripheral artery disease.  She is followed closely in the HF clinic. She presented to the clinic on 05/09/13 and was doing well until she got up to leave and suddenly developed diaphoresis and severe dyspnea. She did not have any CP. Her O2 sats dropped into the 70's, but quickly returned to 90s with O2. Her SBP was 160's during the episode. ECG: NSR, LBBB. She was transferred to the ED. Chest x-ray showed diffuse bilateral pulmonary edema and she was started on Bipap, was given 20 IV lasix, and started on nitroglycerin gtt. Pertinent labs BNP 4292, K+ 5.3 and Troponin 1.72.  During her stay she was given IV lasix and then transitioned to PO lasix 60 mg BID. She had a net loss of 2.2 liters and reported that she no longer had any SOB. Her flash pulmonary was felt to be d/t low gradient AS and she had a low dose dobutamine stress ECHO performed during her stay revealing Low gradient severe AS. She has an appointment with Dr. Owen CVTS surgeon tomorrow to evaluate whether she would be a candidate for AVR or TAVR.  Her Cr remained stable with diuresis and today was 0.93 and her K+ was 4.2. We will continue to follow her closely in the HF clinic and instructed patient to call if she had any weight gain or SOB.  Discharge Weight Range: 135-138 lbs  Discharge Vitals: There were no vitals taken for this visit.  Labs:  Lab Results   Component  Value  Date    WBC  7.2  05/11/2013    HGB  11.3*   05/11/2013    HCT  34.3*  05/11/2013    MCV  93.0  05/11/2013    PLT  165  05/11/2013     Recent Labs  Lab  05/19/13 0925   NA  139   K  4.4   CL  100   CO2  30   BUN  38*   CREATININE  0.95   CALCIUM  11.2*   GLUCOSE  210*    Lab Results   Component  Value  Date    CHOL  207*  11/15/2012    HDL  66.00  11/15/2012    LDLCALC  75  11/15/2010    TRIG  138.0  11/15/2012    BNP (last 3 results)   Recent Labs   05/09/13 1707   PROBNP  4292.0*    Diagnostic Studies/Procedures   No results found.  Discharge Medications      Medication List     ASK your doctor about these medications       aspirin EC 81 MG tablet    Take 81 mg by mouth daily.    calcium citrate-vitamin D 315-200 MG-UNIT per tablet    Commonly known as: CITRACAL+D    Take 2 tablets by mouth 2 (two) times daily.    carvedilol 6.25 MG tablet    Commonly known as: COREG      Take 12.5 mg by mouth 2 (two) times daily with a meal. Morning and afternoon    cholecalciferol 1000 UNITS tablet    Commonly known as: VITAMIN D    Take 2,000 Units by mouth daily.    docusate sodium 100 MG capsule    Commonly known as: COLACE    Take 100 mg by mouth 2 (two) times daily.    ferrous sulfate 325 (65 FE) MG tablet    Take 325 mg by mouth 2 (two) times daily with a meal.    fish oil-omega-3 fatty acids 1000 MG capsule    Take 1 g by mouth 2 (two) times daily.    furosemide 20 MG tablet    Commonly known as: LASIX    Take 3 tablets (60 mg total) by mouth 2 (two) times daily.    Garlic 1000 MG Caps    Take 1 capsule by mouth daily.    losartan 100 MG tablet    Commonly known as: COZAAR    Take 100 mg by mouth daily.    metFORMIN 1000 MG tablet    Commonly known as: GLUCOPHAGE    Take 1,000 mg by mouth 2 (two) times daily with a meal.    pioglitazone 45 MG tablet    Commonly known as: ACTOS    Take 45 mg by mouth daily.    simvastatin 80 MG tablet    Commonly known as: ZOCOR    Take 80 mg by mouth at bedtime.     triamcinolone cream 0.1 %    Commonly known as: KENALOG    Apply 1 application topically 2 (two) times daily as needed (for eczema).      Disposition    The patient will be discharged in stable condition to home.    Future Appointments  Provider  Department  Dept Phone    06/02/2013 11:40 AM  [facility]  [facility]  [phone]    06/13/2013 11:00 AM  [facility]  [facility] VASCULAR LABORATORY  [phone]    06/13/2013 12:00 PM  [facility]  [facility] RESPIRATORY THERAPY  [phone]    06/13/2013 1:00 PM  [facility]  [facility] SAME DAY SURGERY  [phone]    06/13/2013 2:15 PM  Clarence H Owen, MD  [facility] [place]  [phone]        Duration of Discharge Encounter: Greater than 35 minutes  Signed,  Alyona Romack  05/20/2013, 8:00 AM   

## 2013-07-12 ENCOUNTER — Ambulatory Visit (HOSPITAL_COMMUNITY)
Admission: RE | Admit: 2013-07-12 | Discharge: 2013-07-12 | Disposition: A | Discharge status: Home / Self Care | Payer: Medicare Other | Source: Ambulatory Visit | Attending: Internal Medicine | Admitting: Internal Medicine

## 2013-07-12 VITALS — BP 148/58 | HR 65 | Wt 136.0 lb

## 2013-07-12 DIAGNOSIS — I5022 Chronic systolic (congestive) heart failure: Secondary | ICD-10-CM | POA: Insufficient documentation

## 2013-07-12 DIAGNOSIS — Z952 Presence of prosthetic heart valve: Secondary | ICD-10-CM

## 2013-07-12 DIAGNOSIS — Z953 Presence of xenogenic heart valve: Secondary | ICD-10-CM

## 2013-07-13 ENCOUNTER — Other Ambulatory Visit: Payer: Self-pay | Admitting: *Deleted

## 2013-07-13 DIAGNOSIS — I350 Nonrheumatic aortic (valve) stenosis: Secondary | ICD-10-CM

## 2013-07-16 NOTE — Progress Notes (Signed)
Patient ID: Joanna [name], female   DOB: 1930/07/15, 79 y.o.   MRN: [id number] HPI:  Joanna [name] is a 79 year old woman with aortic stenosis s/p AVR/mitral valve replacement (06/15/2013), systolic HF due to NICM, hypertension, diabetes mellitus type II, hyperlipidemia, and peripheral artery disease.   Admitted 12/12 with acute systolic HF, EF 30%. Cath with normal coronaries and normal cardiac output (see below). AS was mild to moderate. In hospital also found to be iron-deficiency anemia. Previous colonoscopies normal. Seen by GI who suggested trial of iron and see if it improved. If not, repeat endo. Stool guaiac was negative.   RA = 8 RV = 58/2/11 PA = 54/22 (37) PCW = 20 Fick cardiac output/index = 4.57/2.67 PVR = 3.7 [place]   12/18/11 ECHO EF 30-35% mod AS mean gradient 27 03/26/12 ECHO EF 35% mod AS mean gradient 27  05/09/13 ECHO EF 30%, mild LVH, moderate MR, septal-lateral dyssynchrony, suspect severe AS with AVA 0.6 and peak/mean gradient 63/33 mmHg.   05/19/2013 ECHO EF 35% severe AS, gradient: 32mm Hg (S). Peak gradient: 57mm Hg   Follow up: Underwent AVR/MVR with placement of epicardial lead on 06/15/13. Was discharged from the hospital 06/27/13. Now at [facility] - about to discharged later this week. Doing very well. Working with PT and ambulating. No edema or other CHF symptoms. Not taking extra lasix. Weight stable.    ROS: All systems negative except as listed in HPI, PMH and Problem List.  Past Medical History  Diagnosis Date  . Peripheral artery disease   . Hyperlipidemia     takes Simvastatin daily  . PONV (postoperative nausea and vomiting)   . Shortness of breath   . Arthritis   . Aortic stenosis 09/21/2011  . AORTIC VALVE SCLEROSIS 02/21/2010  . 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 BUNDLE BRANCH BLOCK, LEFT 07/30/2007  . CAROTID ARTERY STENOSIS 01/05/2009  . CVA 03/14/2010  . 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 PERIPHERAL VASCULAR DISEASE 07/30/2007  . POLYPECTOMY, HX OF 07/30/2007   . Constipation     takes stool softener daily  . CHF (congestive heart failure)     takes lasix daily  . Diabetes mellitus     takes metformin daily and actos   . DM 07/30/2007  . HTN (hypertension)     takes Carvedilol daily and losartan  . HYPERTENSION 07/30/2007  . Weakness     both hands  . Dizziness   . Joint pain   . Itching     on legs d/t dry skin  . Anemia     takes Iron pill daily  . Iron deficiency anemia 09/21/2011  . Nocturia   . History of blood transfusion     no abnormal reaction noted  . History of shingles   . Mitral regurgitation   . S/P aortic valve replacement with bioprosthetic valve 06/15/2013    21 mm [facility] Ease bovine pericardial tissue valve  . S/P mitral valve replacement with bioprosthetic valve 06/15/2013    25 mm [facility] Mitral bovine pericardial tissue valve     Current Outpatient Prescriptions  Medication Sig Dispense Refill  . amiodarone (PACERONE) 400 MG tablet Take 0.5 tablets (200 mg total) by mouth 2 (two) times daily.  60 tablet  1  . aspirin EC 81 MG tablet Take 81 mg by mouth daily.       . calcium citrate-vitamin D (CITRACAL+D) 315-200 MG-UNIT per tablet Take 2 tablets by mouth 2 (two) times daily.       Marland Kitchen  carvedilol (COREG) 6.25 MG tablet Take 12.5 mg by mouth 2 (two) times daily with a meal. Morning and afternoon      . cholecalciferol (VITAMIN D) 1000 UNITS tablet Take 2,000 Units by mouth daily.        Marland Kitchen docusate sodium (COLACE) 100 MG capsule Take 100 mg by mouth 2 (two) times daily.        . ferrous sulfate 325 (65 FE) MG tablet Take 325 mg by mouth 2 (two) times daily with a meal.      . fish oil-omega-3 fatty acids 1000 MG capsule Take 1 g by mouth 2 (two) times daily.       . furosemide (LASIX) 40 MG tablet Take 1 tablet (40 mg total) by mouth daily.  14 tablet  0  . Garlic 1000 MG CAPS Take 1 capsule by mouth daily.       Marland Kitchen losartan (COZAAR) 100 MG tablet Take 100 mg by mouth daily.      . metFORMIN  (GLUCOPHAGE) 1000 MG tablet Take 1,000 mg by mouth 2 (two) times daily with a meal.      . pioglitazone (ACTOS) 45 MG tablet Take 45 mg by mouth daily.      . potassium chloride 20 MEQ TBCR Take 20 mEq by mouth daily.  14 tablet  0  . simvastatin (ZOCOR) 20 MG tablet Take 20 mg by mouth every evening.      . traMADol (ULTRAM) 50 MG tablet Take 1-2 tablets (50-100 mg total) by mouth every 6 (six) hours as needed (50mg  - moderate pain; 100mg  - severe pain).  50 tablet  0  . warfarin (COUMADIN) 1.25 mg TABS tablet Take 0.5 tablets (1.25 mg total) by mouth daily at 6 PM.  100 tablet  1   No current facility-administered medications for this encounter.    Filed Vitals:   07/12/13 1408  BP: 148/58  Pulse: 65  Weight: 136 lb (61.689 kg)  SpO2: 96%    PHYSICAL EXAM: General:  Elderly Well appearing. No resp difficulty HEENT: normal Neck: supple. JVP 5. Carotids 2+ bilaterally; no bruits. No lymphadenopathy or thryomegaly appreciated. Cor: PMI normal. Regular rate & rhythm.  Crisp s1/s2. Sternotomy scar well-healed Lungs: clear Abdomen: soft, nontender, nondistended. No hepatosplenomegaly. No bruits or masses. Good bowel sounds. Extremities: no cyanosis, clubbing, rash, no edema Neuro: alert & orientedx3, cranial nerves grossly intact. Moves all 4 extremities w/o difficulty. Affect pleasant  ASSESSMENT & PLAN: 1. CHF: Chronic systolic CHF, NICM with EF 30%     --she is doing great s/p AVR/MVR. NYHA II. Volume status looks good.    --Reinforced need for daily weights and reviewed use of sliding scale diuretics.   --will not titrate meds at this point   --will need repeat echo at next visit  2. Carotid stenosis: Carotid dopplers 2/14 with 40-59% RICA stenosis, repeat in 2/15.   3. Hyperlipidemia: Given known vascular disease (carotid stenosis), continue statin.   4. Aortic stenosi/mitral regurg - s/p AV/MVR  Truman Hayward 10:57 PM

## 2013-07-18 ENCOUNTER — Encounter: Payer: Self-pay | Admitting: Thoracic Surgery (Cardiothoracic Vascular Surgery)

## 2013-07-18 ENCOUNTER — Ambulatory Visit (INDEPENDENT_AMBULATORY_CARE_PROVIDER_SITE_OTHER): Payer: Self-pay | Admitting: Thoracic Surgery (Cardiothoracic Vascular Surgery)

## 2013-07-18 ENCOUNTER — Ambulatory Visit
Admission: RE | Admit: 2013-07-18 | Discharge: 2013-07-18 | Disposition: A | Discharge status: Home / Self Care | Payer: Medicare Other | Source: Ambulatory Visit | Attending: Thoracic Surgery (Cardiothoracic Vascular Surgery) | Admitting: Thoracic Surgery (Cardiothoracic Vascular Surgery)

## 2013-07-18 VITALS — BP 138/60 | HR 68 | Resp 16 | Ht 66.0 in | Wt 134.0 lb

## 2013-07-18 DIAGNOSIS — I359 Nonrheumatic aortic valve disorder, unspecified: Secondary | ICD-10-CM

## 2013-07-18 DIAGNOSIS — I5022 Chronic systolic (congestive) heart failure: Secondary | ICD-10-CM

## 2013-07-18 DIAGNOSIS — I350 Nonrheumatic aortic (valve) stenosis: Secondary | ICD-10-CM

## 2013-07-18 DIAGNOSIS — I059 Rheumatic mitral valve disease, unspecified: Secondary | ICD-10-CM

## 2013-07-18 DIAGNOSIS — Z952 Presence of prosthetic heart valve: Secondary | ICD-10-CM

## 2013-07-18 DIAGNOSIS — Z954 Presence of other heart-valve replacement: Secondary | ICD-10-CM

## 2013-07-18 MED ORDER — AMIODARONE HCL 400 MG PO TABS: 200.0000 mg | ORAL_TABLET | Freq: Every day | ORAL | Status: DC

## 2013-07-18 NOTE — Progress Notes (Signed)
[address]       [name] [address]             [phone]     CARDIOTHORACIC SURGERY OFFICE NOTE  Referring Provider is Laurey Morale, MD PCP is Illene Regulus, MD   HPI:  Patient returns for routine followup status post aortic and mitral valve replacement using a bioprosthetic tissue valves on 06/15/2013. She also had left ventricular epicardial pacemaker leads placed at the time of surgery because of the presence of severe left ventricular dysfunction preoperatively anticipating the possibility that the patient might benefit from biventricular pacing in the future. Postoperatively the patient developed recurrent paroxysmal atrial fibrillation for which she was treated with amiodarone. Prior to hospital discharge the patient was maintaining sinus rhythm. She was discharged to [facility] skilled nursing facility. She returns for routine followup today.  Since hospital discharge she has done quite well. She was seen in followup by Dr. Gala Romney last week. Her Coumadin dose has been monitored and dosed adjusted appropriately. She was discharged from [facility] last week and is now home with her daughter.  She reports essentially no pain at all. She still feels quite weak but her strength is slowly improving. She has had some trouble with nausea. Her bowels are functioning normally. She has not had any tachypalpitations or dizzy spells. She's had some mild lower extremity edema that seems to be exacerbated when she spends too much time sitting in the chair. She is starting to record her weight on a daily basis at home.   Current Outpatient Prescriptions  Medication Sig Dispense Refill  . amiodarone (PACERONE) 400 MG tablet Take 0.5 tablets (200 mg total) by mouth 2 (two) times daily.  60 tablet  1  . aspirin EC 81 MG tablet Take 81 mg by mouth daily.       . calcium citrate-vitamin D (CITRACAL+D) 315-200 MG-UNIT per tablet Take 2 tablets by mouth 2 (two) times daily.         . carvedilol (COREG) 6.25 MG tablet Take 12.5 mg by mouth 2 (two) times daily with a meal. Morning and afternoon      . cholecalciferol (VITAMIN D) 1000 UNITS tablet Take 2,000 Units by mouth daily.        Marland Kitchen docusate sodium (COLACE) 100 MG capsule Take 100 mg by mouth 2 (two) times daily.        . ferrous sulfate 325 (65 FE) MG tablet Take 325 mg by mouth 2 (two) times daily with a meal.      . fish oil-omega-3 fatty acids 1000 MG capsule Take 1 g by mouth 2 (two) times daily.       . furosemide (LASIX) 40 MG tablet Take 1 tablet (40 mg total) by mouth daily.  14 tablet  0  . Garlic 1000 MG CAPS Take 1 capsule by mouth daily.       Marland Kitchen losartan (COZAAR) 100 MG tablet Take 100 mg by mouth daily.      . metFORMIN (GLUCOPHAGE) 1000 MG tablet Take 1,000 mg by mouth 2 (two) times daily with a meal.      . pioglitazone (ACTOS) 45 MG tablet Take 45 mg by mouth daily.      . potassium chloride 20 MEQ TBCR Take 20 mEq by mouth daily.  14 tablet  0  . simvastatin (ZOCOR) 20 MG tablet Take 20 mg by mouth every evening.      . warfarin (COUMADIN) 1.25  mg TABS tablet Take 0.5 tablets (1.25 mg total) by mouth daily at 6 PM.  100 tablet  1   No current facility-administered medications for this visit.      Physical Exam:   BP 138/60  Pulse 68  Resp 16  Ht 5\' 6"  (1.676 m)  Wt 134 lb (60.782 kg)  BMI 21.64 kg/m2  SpO2 94%  General:  Well-appearing  Chest:   Clear to auscultation with symmetrical breath sounds  CV:   Regular rate and rhythm without murmur  Incisions:  Clean and dry and healing nicely, sternum is stable  Abdomen:  Soft nontender  Extremities:  Warm and well-perfused with mild lower extremity edema  Diagnostic Tests:  CLINICAL DATA: Aortic stenosis. Heart surgery 06/15/2013  EXAM:  CHEST 2 VIEW  COMPARISON: 06/22/2013  FINDINGS:  Postop aortic valve and mitral valve replacement. Epicardial pacer  leads remain in place and unchanged.  Small bilateral pleural effusions, slightly  increased in size from  the prior study. Mild bibasilar atelectasis also increased.  Negative for heart failure or edema.  IMPRESSION:  Mild increase in small bilateral effusions and bibasilar  atelectasis. Negative for edema.  Electronically Signed  By: Marlan Palau M.D.  On: 07/18/2013 10:30    Impression:  Patient is doing quite well approximately one month following aortic and mitral valve replacement using a bioprosthetic tissue valves.     Plan:  I've encouraged patient to continue to gradually increase her physical activity as tolerated. She plans to begin home health physical therapy this week. At some point once her strength improves it may be reasonable to transition to the outpatient cardiac rehabilitation program. I have instructed the patient to decrease her dose of amiodarone to 200 mg daily. It is possible this may be contributing to her problems with nausea. We have otherwise not made any changes in her current medications. At some point will also be important to check a followup echocardiogram to reassess her left ventricular function. We will plan to see the patient back in 2 months for routine followup, rhythm check, and to review her most recent echocardiogram.    Salvatore Decent. Cornelius Moras, MD 07/18/2013 10:59 AM

## 2013-07-18 NOTE — Patient Instructions (Signed)
The patient should continue to avoid any heavy lifting or strenuous use of arms or shoulders for at least a total of three months from the time of surgery.  Decrease amiodarone to once daily.

## 2013-07-20 ENCOUNTER — Telehealth: Payer: Self-pay | Admitting: Cardiology

## 2013-07-20 NOTE — Telephone Encounter (Signed)
New Problem:  Joanna [name] wanted to check on a home health plan of care form that has not been signed yet by Dr. Shirlee Latch. Could you please update Joanna [name] on the status. Joanna [name] states she is also faxing the form again today. Please call back w/ status.

## 2013-07-22 ENCOUNTER — Ambulatory Visit (INDEPENDENT_AMBULATORY_CARE_PROVIDER_SITE_OTHER): Payer: Medicare Other | Admitting: Pharmacist

## 2013-07-22 DIAGNOSIS — I6350 Cerebral infarction due to unspecified occlusion or stenosis of unspecified cerebral artery: Secondary | ICD-10-CM

## 2013-07-22 DIAGNOSIS — Z952 Presence of prosthetic heart valve: Secondary | ICD-10-CM

## 2013-07-22 DIAGNOSIS — I4891 Unspecified atrial fibrillation: Secondary | ICD-10-CM

## 2013-07-22 DIAGNOSIS — Z953 Presence of xenogenic heart valve: Secondary | ICD-10-CM

## 2013-07-22 LAB — POCT INR: INR: 1.1

## 2013-07-22 MED ORDER — POTASSIUM CHLORIDE ER 20 MEQ PO TBCR: 20.0000 meq | EXTENDED_RELEASE_TABLET | Freq: Every day | ORAL | Status: DC

## 2013-07-25 NOTE — Telephone Encounter (Signed)
Forms signed and faxed back today

## 2013-07-27 ENCOUNTER — Ambulatory Visit (INDEPENDENT_AMBULATORY_CARE_PROVIDER_SITE_OTHER): Payer: Medicare Other | Admitting: Cardiology

## 2013-07-27 ENCOUNTER — Other Ambulatory Visit: Payer: Self-pay | Admitting: *Deleted

## 2013-07-27 DIAGNOSIS — Z953 Presence of xenogenic heart valve: Secondary | ICD-10-CM

## 2013-07-27 DIAGNOSIS — I6350 Cerebral infarction due to unspecified occlusion or stenosis of unspecified cerebral artery: Secondary | ICD-10-CM

## 2013-07-27 DIAGNOSIS — I4891 Unspecified atrial fibrillation: Secondary | ICD-10-CM

## 2013-07-27 DIAGNOSIS — Z952 Presence of prosthetic heart valve: Secondary | ICD-10-CM

## 2013-07-27 LAB — POCT INR: INR: 1.3

## 2013-07-27 MED ORDER — SIMVASTATIN 20 MG PO TABS: 20.0000 mg | ORAL_TABLET | Freq: Every evening | ORAL | Status: DC

## 2013-07-27 MED ORDER — WARFARIN SODIUM 1 MG PO TABS: 1.0000 mg | ORAL_TABLET | ORAL | Status: DC

## 2013-07-29 ENCOUNTER — Telehealth (HOSPITAL_COMMUNITY): Payer: Self-pay | Admitting: Cardiology

## 2013-07-29 DIAGNOSIS — Z952 Presence of prosthetic heart valve: Secondary | ICD-10-CM

## 2013-07-29 DIAGNOSIS — I5022 Chronic systolic (congestive) heart failure: Secondary | ICD-10-CM

## 2013-07-29 NOTE — Telephone Encounter (Signed)
Order placed for upcoming ECHO 

## 2013-08-02 ENCOUNTER — Telehealth: Payer: Self-pay | Admitting: Internal Medicine

## 2013-08-02 NOTE — Telephone Encounter (Signed)
Phone call to Barbara(daughter) she states she is fine now. She ate lunch okay today. Will keep visit for tomorrow. [name] states she was just confused on who to call since she has so many doctors but this issue has been going on since before the surgery.

## 2013-08-02 NOTE — Telephone Encounter (Signed)
Sounds like this has been a problem for several weeks and can wait safely until tomorrow.  Has the surgeon been made aware of the problem.?

## 2013-08-02 NOTE — Telephone Encounter (Signed)
Pt's daughter called stated that Joanna [name] have been throwing up(on and off) and having trouble swallowing since she had her open heart surgery. Pt's daughter is very concern because Mrs. Stroh is very weak. Pt has an appt with Dr. Debby Bud tomorrow 08/03/13. Please advise, pt's daughter would like to know what Dr. Debby Bud think they should do, ok to wait until appoitment tomorrow?

## 2013-08-03 ENCOUNTER — Other Ambulatory Visit: Payer: Self-pay | Admitting: *Deleted

## 2013-08-03 ENCOUNTER — Ambulatory Visit (INDEPENDENT_AMBULATORY_CARE_PROVIDER_SITE_OTHER): Payer: Medicare Other | Admitting: Internal Medicine

## 2013-08-03 ENCOUNTER — Encounter: Payer: Self-pay | Admitting: Internal Medicine

## 2013-08-03 VITALS — BP 138/58 | HR 63 | Temp 97.8°F | Wt 131.8 lb

## 2013-08-03 DIAGNOSIS — Z953 Presence of xenogenic heart valve: Secondary | ICD-10-CM

## 2013-08-03 DIAGNOSIS — Z0000 Encounter for general adult medical examination without abnormal findings: Secondary | ICD-10-CM

## 2013-08-03 DIAGNOSIS — Z23 Encounter for immunization: Secondary | ICD-10-CM

## 2013-08-03 DIAGNOSIS — I10 Essential (primary) hypertension: Secondary | ICD-10-CM

## 2013-08-03 DIAGNOSIS — Z952 Presence of prosthetic heart valve: Secondary | ICD-10-CM

## 2013-08-03 DIAGNOSIS — R1314 Dysphagia, pharyngoesophageal phase: Secondary | ICD-10-CM

## 2013-08-03 DIAGNOSIS — Z8679 Personal history of other diseases of the circulatory system: Secondary | ICD-10-CM

## 2013-08-03 DIAGNOSIS — I480 Paroxysmal atrial fibrillation: Secondary | ICD-10-CM

## 2013-08-03 DIAGNOSIS — I6350 Cerebral infarction due to unspecified occlusion or stenosis of unspecified cerebral artery: Secondary | ICD-10-CM

## 2013-08-03 DIAGNOSIS — I4891 Unspecified atrial fibrillation: Secondary | ICD-10-CM

## 2013-08-03 DIAGNOSIS — R1310 Dysphagia, unspecified: Secondary | ICD-10-CM | POA: Insufficient documentation

## 2013-08-03 MED ORDER — AMIODARONE HCL 400 MG PO TABS: 200.0000 mg | ORAL_TABLET | Freq: Every day | ORAL | Status: DC

## 2013-08-03 NOTE — Assessment & Plan Note (Addendum)
She describes esophageal dysphagia with a sensation of food getting caught in her throat with regurgitation of food and phlegm. She has less trouble with liquid foods like soup and yogurt. She describes some nausea, but no vomiting. This is concerning for gastroparesis (given her history of diabetes) vs. Esophageal stricture vs. Esophageal dysmotility. Some symptoms of acid reflux, but no long history of these symptoms, and she has never required treatment for GERD in the past. Her symptoms were present prior to her heart surgery, but have worsened since then. Possibility of cardiac enlargement leading to esophageal compression also possible.   Plan:  --Further evaluate causes of dysphagia by barium swallow. Pending the results of this study, may order speech pathology evaluation, gastric motility study, chest CT or EGD.

## 2013-08-03 NOTE — Progress Notes (Signed)
Subjective:     Patient ID: Joanna [name], female   DOB: [birth year], 79 y.o.   MRN: [id number]  HPI [name] is a 79 year old woman with a history of diabetes, hyperlipidemia, iron deficiency anemia, carotid artery stenosis, aortic stenosis and mitral regurgitation,  s/p aortic and mitral valve replacement in August who presents for evaluation of dysphagia. She has had trouble swallowing since before the surgery-- "feels like her throat closes." She indicates that this is in her throat. She feels like the food won't go down. She has more trouble with solid foods, but has no trouble with liquids like soup or yogurt. There was one time when she couldn't keep jello down. This feels like it's coming from her esophagus, not her stomach. She has had some regurgitation of phlegm--white, no blood The phlegm gags her and she has dry heaves. These occur intermittently and only some days. She has no trouble initiating a swallow, but once she swallows, she feels the food doesn't get all the way down. No pain when swallowing.  The heart doctor called it gastroparesis. He also decreased her dose of amiodarone in case this was contributing to nausea--this did not improve her symptoms. She has a history of burning in her central chest, but only in the hospital. She hasn't been on medicines for acid reflux. She does have occasional heartburn. She denies cough, but feels strangled on her saliva. No SOB since heart surgery. No chest pain. Does have nausea all the time but no loss of appetite.   She is now living by herself, but does have home health until the end of this month. PT and OT are seeing her until the end of the month as well.  A nurse provided by her insurance company will come as needed after that. Her daughter lives nearby. She uses a walker to ambulate in her own house. She needs help to shower/bathe; has no grab bars in the shower.   Review of Systems General: No fevers or chills.  CV: No chest pain, or SOB.  Feels much better since her surgery.  Pulm: No wheezing or chest tightness.  GI: No vomiting. No constipation or diarrhea. Takes a stool softener.  Neuro: No changes in balance. No changes in vision. Feels light-headed at times.     Objective:   Physical Exam Filed Vitals:   08/03/13 1608  BP: 138/58  Pulse: 63  Temp: 97.8 F (36.6 C)   General: Woman sitting in wheelchair in the exam room in NAD.  Neuro: CN II-XII grossly intact. No evidence of oropharyngeal edema or masses on physical exam.  CV: RRR.  Lungs: CTAB.  Abdominal: Normal active bowel sounds. No tenderness to palpation.   Current Outpatient Prescriptions on File Prior to Visit  Medication Sig Dispense Refill  . aspirin EC 81 MG tablet Take 81 mg by mouth daily.       . calcium citrate-vitamin D (CITRACAL+D) 315-200 MG-UNIT per tablet Take 2 tablets by mouth 2 (two) times daily.       . carvedilol (COREG) 6.25 MG tablet Take 12.5 mg by mouth 2 (two) times daily with a meal. Morning and afternoon      . cholecalciferol (VITAMIN D) 1000 UNITS tablet Take 2,000 Units by mouth daily.        Marland Kitchen docusate sodium (COLACE) 100 MG capsule Take 100 mg by mouth 2 (two) times daily.        . ferrous sulfate 325 (65 FE) MG tablet  Take 325 mg by mouth 2 (two) times daily with a meal.      . furosemide (LASIX) 40 MG tablet Take 1 tablet (40 mg total) by mouth daily.  14 tablet  0  . Garlic 1000 MG CAPS Take 1 capsule by mouth daily.       Marland Kitchen losartan (COZAAR) 100 MG tablet Take 100 mg by mouth daily.      . metFORMIN (GLUCOPHAGE) 1000 MG tablet Take 1,000 mg by mouth 2 (two) times daily with a meal.      . pioglitazone (ACTOS) 45 MG tablet Take 45 mg by mouth daily.      . Potassium Chloride ER 20 MEQ TBCR Take 20 mEq by mouth daily.  90 tablet  0  . simvastatin (ZOCOR) 20 MG tablet Take 1 tablet (20 mg total) by mouth every evening.  30 tablet  5  . warfarin (COUMADIN) 1 MG tablet Take 1 tablet (1 mg total) by mouth as directed.  90  tablet  0  . fish oil-omega-3 fatty acids 1000 MG capsule Take 1 g by mouth 2 (two) times daily.        No current facility-administered medications on file prior to visit.      Assessment:     Ms. Scaff is an 79 year old recently s/p aortic and mitral valve replacement who presents for evaluation of progressive esophageal dysphagia.     Plan:     See plan by problem list.

## 2013-08-03 NOTE — Patient Instructions (Addendum)
Flu shot today! Glad to see you are doing well after your heart surgery.   For the dysphagia, we plan to do a barium swallow. This will be done at [facility], and they will call you to set up an appointment. This involves taking X-ray like images of your mouth, esophagus, and stomach while you swallow a thick drink. This will help [place] to determine whether your esophagus is being narrowed by a structure or if there is a problem with the movement of the muscles in your esophagus while you swallow.   Based on the results of this test, we will consider doing other tests as well which may include: a gastric motility study where they will watch how long it takes for your stomach to empty, a chest CT, or a upper gastrointestinal endoscopy where a flexible tube with a camera will be passed into your esophagus. All of these studies can be done as an outpatient. We will be in touch after your barium swallow about what other tests might be necessary.

## 2013-08-03 NOTE — Assessment & Plan Note (Signed)
Fl shot today.

## 2013-08-04 NOTE — Assessment & Plan Note (Signed)
BP Readings from Last 3 Encounters:  08/03/13 138/58  07/18/13 138/60  07/12/13 148/58   Adequate control.

## 2013-08-09 ENCOUNTER — Telehealth (HOSPITAL_COMMUNITY): Payer: Self-pay | Admitting: Cardiac Rehabilitation

## 2013-08-09 NOTE — Telephone Encounter (Signed)
pc to pt to enroll in cardiac rehab program. Pt declined at this time, pt states "I have been through so much"  Pt is currently receiving home health physical therapy.  Pt encouraged to continue exercising on her own as she is able.  Dr. Gala Romney made aware.

## 2013-08-15 ENCOUNTER — Other Ambulatory Visit: Payer: Self-pay | Admitting: Internal Medicine

## 2013-08-16 ENCOUNTER — Telehealth: Payer: Self-pay

## 2013-08-16 NOTE — Telephone Encounter (Signed)
Patient daughter [name] called requesting status of appt for "swallowing test" ordered on 08/03/13. She would like a call back st [phone]. Thanks

## 2013-08-17 ENCOUNTER — Ambulatory Visit (INDEPENDENT_AMBULATORY_CARE_PROVIDER_SITE_OTHER): Payer: Medicare Other | Admitting: Cardiovascular Disease

## 2013-08-17 DIAGNOSIS — I4891 Unspecified atrial fibrillation: Secondary | ICD-10-CM

## 2013-08-17 DIAGNOSIS — I6350 Cerebral infarction due to unspecified occlusion or stenosis of unspecified cerebral artery: Secondary | ICD-10-CM

## 2013-08-17 DIAGNOSIS — Z953 Presence of xenogenic heart valve: Secondary | ICD-10-CM

## 2013-08-17 DIAGNOSIS — Z952 Presence of prosthetic heart valve: Secondary | ICD-10-CM

## 2013-08-17 LAB — POCT INR: INR: 2.3

## 2013-08-17 NOTE — Progress Notes (Signed)
Patient ID: Joanna [name], female   DOB: 1930-09-03, 79 y.o.   MRN: [id number]  PCP: Dr Arthur Holms HPI: Joanna [name] is a 79 year old woman with aortic stenosis s/p AVR/mitral valve replacement (06/15/2013), systolic HF due to NICM, hypertension, diabetes mellitus type II, hyperlipidemia, and peripheral artery disease. S/P  Underwent AVR/MVR with placement of epicardial lead on 06/15/13.  Admitted 12/12 with acute systolic HF, EF 30%. Cath with normal coronaries and normal cardiac output (see below). AS was mild to moderate. In hospital also found to be iron-deficiency anemia. Previous colonoscopies normal. Seen by GI who suggested trial of iron and see if it improved. If not, repeat endo. Stool guaiac was negative.   RA = 8 RV = 58/2/11 PA = 54/22 (37) PCW = 20 Fick cardiac output/index = 4.57/2.67 PVR = 3.7 Woods   12/18/11 ECHO EF 30-35% mod AS mean gradient 27 03/26/12 ECHO EF 35% mod AS mean gradient 27  05/09/13 ECHO EF 30%, mild LVH, moderate MR, septal-lateral dyssynchrony, suspect severe AS with AVA 0.6 and peak/mean gradient 63/33 mmHg.   05/19/2013 ECHO EF 35% severe AS, gradient: 32mm Hg (S). Peak gradient: 57mm Hg  08/18/13 ECHO EF 45-50% AV working well  S/P AVR/MVR with placement of epicardial lead on 06/15/13. Was discharged from the hospital 06/27/13.   Joanna [name] returns for follow up. Joanna [name] has been discharged back to home from [facility]. Lives alone but daughter very involved in her care.. Ambulates with cane or walker. Denies SOB/PND/Orthopnea.  Does complain of fatigue. N/V at least 2 days a week. Feels if Joanna [name] cannot get food down. Joanna [name] says Joanna [name] has not had any medications in the last 6 days. PCP evaluated and recommended swallowing study. Plan for swallow evaluation today. Joanna [name] has not been weighing at home. Poor appetite.   ROS: All systems negative except as listed in HPI, PMH and Problem List.  Past Medical History  Diagnosis Date  . Peripheral artery disease   . Hyperlipidemia     takes  Simvastatin daily  . PONV (postoperative nausea and vomiting)   . Shortness of breath   . Arthritis   . Aortic stenosis 09/21/2011  . AORTIC VALVE SCLEROSIS 02/21/2010  . 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 BUNDLE BRANCH BLOCK, LEFT 07/30/2007  . CAROTID ARTERY STENOSIS 01/05/2009  . CVA 03/14/2010  . 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 PERIPHERAL VASCULAR DISEASE 07/30/2007  . POLYPECTOMY, HX OF 07/30/2007  . Constipation     takes stool softener daily  . CHF (congestive heart failure)     takes lasix daily  . Diabetes mellitus     takes metformin daily and actos   . DM 07/30/2007  . HTN (hypertension)     takes Carvedilol daily and losartan  . HYPERTENSION 07/30/2007  . Weakness     both hands  . Dizziness   . Joint pain   . Itching     on legs d/t dry skin  . Anemia     takes Iron pill daily  . Iron deficiency anemia 09/21/2011  . Nocturia   . History of blood transfusion     no abnormal reaction noted  . History of shingles   . Mitral regurgitation   . S/P aortic valve replacement with bioprosthetic valve 06/15/2013    21 mm [facility] Ease bovine pericardial tissue valve  . S/P mitral valve replacement with bioprosthetic valve 06/15/2013    25 mm [facility] Mitral bovine pericardial tissue valve     Current  Outpatient Prescriptions  Medication Sig Dispense Refill  . amiodarone (PACERONE) 400 MG tablet Take 0.5 tablets (200 mg total) by mouth daily.  20 tablet  0  . aspirin EC 81 MG tablet Take 81 mg by mouth daily.       . calcium citrate-vitamin D (CITRACAL+D) 315-200 MG-UNIT per tablet Take 2 tablets by mouth 2 (two) times daily.       . carvedilol (COREG) 6.25 MG tablet Take 12.5 mg by mouth 2 (two) times daily with a meal. Morning and afternoon      . cholecalciferol (VITAMIN D) 1000 UNITS tablet Take 2,000 Units by mouth daily.        Marland Kitchen docusate sodium (COLACE) 100 MG capsule Take 100 mg by mouth 2 (two) times daily.        . ferrous sulfate 325 (65 FE) MG  tablet Take 325 mg by mouth 2 (two) times daily with a meal.      . fish oil-omega-3 fatty acids 1000 MG capsule Take 1 g by mouth 2 (two) times daily.       . furosemide (LASIX) 40 MG tablet Take 1 tablet (40 mg total) by mouth daily.  14 tablet  0  . Garlic 1000 MG CAPS Take 1 capsule by mouth daily.       Marland Kitchen losartan (COZAAR) 100 MG tablet Take 100 mg by mouth daily.      . metFORMIN (GLUCOPHAGE) 1000 MG tablet Take 1,000 mg by mouth 2 (two) times daily with a meal.      . pioglitazone (ACTOS) 45 MG tablet Take 45 mg by mouth daily.      . pioglitazone (ACTOS) 45 MG tablet Take 1 tablet by mouth  daily  90 tablet  3  . Potassium Chloride ER 20 MEQ TBCR Take 20 mEq by mouth daily.  90 tablet  0  . simvastatin (ZOCOR) 20 MG tablet Take 1 tablet (20 mg total) by mouth every evening.  30 tablet  5  . warfarin (COUMADIN) 1 MG tablet Take 1 tablet (1 mg total) by mouth as directed.  90 tablet  0   No current facility-administered medications for this encounter.    Filed Vitals:   08/18/13 0942  BP: 96/46  Pulse: 63  Weight: 125 lb 12 oz (57.04 kg)  SpO2: 98%    PHYSICAL EXAM: General:  Elderly Fatigued appearing. Pale No resp difficulty daughter present HEENT: normal Neck: supple. JVP 5-6 Carotids 2+ bilaterally; no bruits. No lymphadenopathy or thryomegaly appreciated. Cor: PMI normal. Regular rate & rhythm.  Crisp s1/s2. Sternotomy scar well-healed Lungs: clear Abdomen: soft, nontender, nondistended. No hepatosplenomegaly. No bruits or masses. Good bowel sounds. Extremities: no cyanosis, clubbing, rash, R and LLE 1+  edema Neuro: alert & orientedx3, cranial nerves grossly intact. Moves all 4 extremities w/o difficulty. Affect pleasant  ASSESSMENT & PLAN: 1. CHF: Chronic systolic CHF, NICM with EF 30%      s/p AVR/MVR. NYHA II. Fatigue likely due to her N/V and poor appetite. Volume status low but Joanna [name] does have lower extremity edema. Add ted hose. Weight trending down. Cut lasix back  to 1-2 days until Joanna [name] is feeling better and able to tolerate medications. Unbable to take medications due to N/V.  - Dr Gala Romney reviewed and discussed ECHO. EF 45-50%  2. Carotid stenosis: Carotid dopplers 2/14 with 40-59% RICA stenosis, repeat in 2/15.   3. Hyperlipidemia: Given known vascular disease (carotid stenosis), continue statin.   4. Aortic stenosi/mitral regurg -  s/p AV/MVR  5. Nausea/Vomiting.- Plan for swallowing study today per PCP Dr Arthur Holms.   Follow up  CLEGG,AMYNP-C   Patient seen and examined with Tonye Becket, NP. We discussed all aspects of the encounter. I agree with the assessment and plan as stated above. Joanna [name] continues to struggle after her surgery. Joanna [name] is having trouble swallowing and is quite weak. Joanna [name] has a swallows study scheduled for today. I reviewed echo and EF is improved. AVR stable. Joanna [name] does have some mild LE edema but neck veins are not voerly elevated. Her po intake is poor. I recommended placing support hose. Can take lasix 1-2x per week as needed for worsening edema but I think man issue is her need for nutrition. We will follow closely.   Cathrine Krizan,MD 12:16 AM

## 2013-08-18 ENCOUNTER — Encounter: Payer: Self-pay | Admitting: Internal Medicine

## 2013-08-18 ENCOUNTER — Ambulatory Visit (HOSPITAL_COMMUNITY)
Admission: RE | Admit: 2013-08-18 | Discharge: 2013-08-18 | Disposition: A | Discharge status: Home / Self Care | Payer: Medicare Other | Source: Ambulatory Visit | Attending: Internal Medicine | Admitting: Internal Medicine

## 2013-08-18 ENCOUNTER — Ambulatory Visit (HOSPITAL_COMMUNITY)
Admission: RE | Admit: 2013-08-18 | Discharge: 2013-08-18 | Disposition: A | Discharge status: Home / Self Care | Payer: Medicare Other | Source: Ambulatory Visit | Attending: Cardiology | Admitting: Cardiology

## 2013-08-18 VITALS — BP 96/46 | HR 63 | Wt 125.8 lb

## 2013-08-18 DIAGNOSIS — I5022 Chronic systolic (congestive) heart failure: Secondary | ICD-10-CM

## 2013-08-18 DIAGNOSIS — I350 Nonrheumatic aortic (valve) stenosis: Secondary | ICD-10-CM

## 2013-08-18 DIAGNOSIS — K224 Dyskinesia of esophagus: Secondary | ICD-10-CM | POA: Insufficient documentation

## 2013-08-18 DIAGNOSIS — I517 Cardiomegaly: Secondary | ICD-10-CM

## 2013-08-18 DIAGNOSIS — R1310 Dysphagia, unspecified: Secondary | ICD-10-CM

## 2013-08-18 DIAGNOSIS — I359 Nonrheumatic aortic valve disorder, unspecified: Secondary | ICD-10-CM

## 2013-08-18 DIAGNOSIS — Z953 Presence of xenogenic heart valve: Secondary | ICD-10-CM

## 2013-08-18 DIAGNOSIS — K449 Diaphragmatic hernia without obstruction or gangrene: Secondary | ICD-10-CM | POA: Insufficient documentation

## 2013-08-18 DIAGNOSIS — Z952 Presence of prosthetic heart valve: Secondary | ICD-10-CM

## 2013-08-18 DIAGNOSIS — K222 Esophageal obstruction: Secondary | ICD-10-CM

## 2013-08-18 NOTE — Progress Notes (Signed)
Echocardiogram 2D Echocardiogram has been performed.  Joanna [name] 08/18/2013, 9:52 AM

## 2013-08-18 NOTE — Patient Instructions (Addendum)
Follow up in 1 month  Take lasix 1-2 days a week  Do the following things EVERYDAY: 1) Weigh yourself in the morning before breakfast. Write it down and keep it in a log. 2) Take your medicines as prescribed 3) Eat low salt foods-Limit salt (sodium) to 2000 mg per day.  4) Stay as active as you can everyday 5) Limit all fluids for the day to less than 2 liters

## 2013-08-22 ENCOUNTER — Encounter: Payer: Self-pay | Admitting: Physician Assistant

## 2013-08-22 ENCOUNTER — Other Ambulatory Visit: Payer: Self-pay | Admitting: Internal Medicine

## 2013-08-22 ENCOUNTER — Ambulatory Visit (INDEPENDENT_AMBULATORY_CARE_PROVIDER_SITE_OTHER): Payer: Medicare Other | Admitting: Physician Assistant

## 2013-08-22 ENCOUNTER — Telehealth: Payer: Self-pay | Admitting: *Deleted

## 2013-08-22 VITALS — BP 164/64 | HR 70 | Ht 64.0 in | Wt 126.0 lb

## 2013-08-22 DIAGNOSIS — R1310 Dysphagia, unspecified: Secondary | ICD-10-CM

## 2013-08-22 DIAGNOSIS — K219 Gastro-esophageal reflux disease without esophagitis: Secondary | ICD-10-CM

## 2013-08-22 DIAGNOSIS — R110 Nausea: Secondary | ICD-10-CM

## 2013-08-22 MED ORDER — ONDANSETRON HCL 4 MG PO TABS: 4.0000 mg | ORAL_TABLET | Freq: Three times a day (TID) | ORAL | Status: DC | PRN

## 2013-08-22 MED ORDER — OMEPRAZOLE 20 MG PO CPDR: 20.0000 mg | DELAYED_RELEASE_CAPSULE | ORAL | Status: DC

## 2013-08-22 NOTE — Patient Instructions (Signed)
Please do a soft diet and chopped up meat only.   Information on a soft diet is below for your review.  We have sent the following medications to your pharmacy for you to pick up at your convenience: Prilosec 20 mg, please take one capsule by mouth in the morning  Zofran 4 mg, one tablet by mouth every eight hours as needed for nausea  You have a follow up visit with Mike Gip on 09-13-2013 at 9 am. _______________________________________________________________________________________________  Soft Diet  SOFT DIET FOOD LISTS Milk/Dairy  Allowed: Milk and milk drinks, milk shakes, cream cheese, cottage cheese, mild cheeses.  Avoid: Sharp or highly seasoned cheese. Meat/Meat Substitutes  Allowed: Broiled, roasted, baked, or stewed tender lean beef, mutton, lamb, veal, chicken, [place], liver, ham, crisp bacon, white fish, tuna, salmon. Eggs, smooth peanut butter.  Avoid: All fried meats, fish, or fowl. Rich gravies and sauces. Lunch meats, sausages, hot dogs. Meats with gristle, chunky peanut butter. Breads/Grains  Allowed: Rice, noodles, spaghetti, macaroni. Dry or cooked refined cereals, such as farina, cream of wheat, oatmeal, grits, whole-wheat cereals. Plain or toasted white or wheat blend or whole-grain breads, soda crackers or saltines, flour tortillas.  Avoid: Wild rice, coarse cereals, such as bran. Seed in or on breads and crackers. Bread or bread products with nuts or seeds. Fruits/Vegetables  Allowed: Fruit and vegetable juices, well-cooked or canned fruits and vegetables, any dried fruit. One citrus fruit daily, 1 vitamin A source daily. Well-ripened, easy to chew fruits, sweet potatoes. Baked, boiled, mashed, creamed, scalloped, or au gratin potatoes. Broths or creamed soups made with allowed vegetables, strained tomatoes.  Avoid: All gas-forming vegetables (corn, radishes, Brussels sprouts, onions, broccoli, cabbage, parsnips, turnips, chili peppers, pinto beans,  split peas, dried beans). Fruits containing seeds and skin. Potato chips and corn chips. All others that are not made with allowed vegetables. Highly seasoned soups. Desserts/Sweets  Allowed: Simple desserts, such as custard, junkets, gelatin desserts, plain ice cream and sherbets, simple cakes and cookies, allowed fruits, sugar, syrup, jelly, honey, plain hard candy, and molasses.  Avoid: Rich pastries, any dessert containing dates, nuts, raisins, or coconut. Fried pastries, such as doughnuts. Chocolate. Beverages  Allowed: Fruit and vegetable juices. Caffeine-free carbonated drinks, coffee, and tea.  Avoid: Caffeinated beverages: coffee, tea, soda or pop. Miscellaneous  Allowed: Butter, cream, margarine, mayonnaise, oil. Cream sauces, salt, and mild spices.  Avoid: Highly spiced salad dressings. Highly seasoned foods, hot sauce, mustard, horseradish, and pepper. SAMPLE MENU Breakfast  Orange juice.  Oatmeal.  Soft cooked egg.  Toast and margarine.  2% milk.  Coffee. Lunch  Meatloaf.  Mashed potato.  Green beans.  Lemon pudding.  Bread and margarine.  Coffee. Dinner  Consomm or apricot nectar.  Chicken breast.  Rice, peas, and carrots.  Applesauce.  Bread and margarine.  2% milk. To cut the amount of fat in your diet, omit margarine and use 1% or skim milk. NUTRIENT ANALYSIS  Calories........................1953 Kcal.  Protein.........................102 gm.  Carbohydrate...............247 gm.  Fat................................65 gm.  Cholesterol...................449 mg.  Dietary fiber.................19 gm.  Vitamin A.....................2944 RE.  Vitamin C.....................79 mg.  Niacin..........................25 mg.  Riboflavin....................2.0 mg.  Thiamin.......................1.5 mg.  Folate..........................249 mcg.  Calcium.......................1030 mg.  Phosphorus.................1782  mg.  Zinc..............................12 mg.  Iron..............................13 mg.  Sodium.........................299 mg.  Potassium....................3046 mg. Document Released: 01/13/2008 Document Revised: 12/29/2011 Document Reviewed: 01/13/2008 [facility] Patient Information 2014 [place], [place].

## 2013-08-22 NOTE — Progress Notes (Signed)
Subjective:    Patient ID: Joanna [name], female    DOB: 1930/09/22, 79 y.o.   MRN: [id number]  HPI  Joanna [name] is a pleasant 79 year old white female known to Dr. Leone Payor from prior colonoscopy. She had colonoscopy in 2009 showing pan diverticulosis and a 3 mm cecal polyp which was adenomatous. She has not had any prior upper endoscopy done. She is currently referred for evaluation of dysphagia. Patient has history of congestive heart failure and is status post aortic valve and mitral valve replacements both done in 06/15/2013 with tissue bowels. She is currently on Coumadin. She also has history of severe left ventricular dysfunction which has significantly improved. She had echo done on 08/18/2013 with an EF of 50-55%. Also with history of diabetes mellitus, hypertension, atrial fib , and prior CVA. Patient states that she has been very weak since her surgery in late August. She has been making some progress but her daughter states that she is still very weak and that she has not been E. been well which she says she thinks is contributing. Patient relates intermittent episodes of nausea which started after her surgery. She says these can be associated with dry heaves and a lot of phlegm in her throat but she generally does not actually vomit. These episodes are not occurring every day and don't seem to have  She's aware of. She does seem to think she feels better with some food on her stomach. She has been on amiodarone and the dosage has been decreased however still felt this may be contributing to her nausea. Patient denies any abdominal pain and states that her bowel habits have been normal no melena or hematochezia. She denies any heartburn or indigestion does say she has some epigastric discomfort and soreness from dry heaves. She is also having intermittent dysphagia. Again on careful questioning she's not having every day but frequently. She says she has most difficulty with solid foods like meats and  feels that they will go part way down [place]st esophagus and then stop. She says she has to stop eating and the food woill  generally go on down. Sh e is currently not having any difficulty with pills or with liquids. Her daughter states that she was complaining more about the dysphagia until she had a barium swallow last week and then has had a good week since then.  Barium swallow was done on 08/04/2013 and shows the GE junction to be relatively narrowed. She has strong tertiary contractions consistent with an underlying motility disorder and a barium tablet did transiently hang at the GE junction.    Review of Systems  Constitutional: Positive for activity change, appetite change, fatigue and unexpected weight change.  HENT: Positive for trouble swallowing.   Eyes: Negative.   Respiratory: Positive for cough.   Cardiovascular: Negative.   Gastrointestinal: Positive for nausea.  Endocrine: Negative.   Genitourinary: Negative.   Musculoskeletal: Negative.   Allergic/Immunologic: Negative.   Neurological: Positive for weakness.  Hematological: Negative.    Outpatient Prescriptions Prior to Visit  Medication Sig Dispense Refill  . amiodarone (PACERONE) 400 MG tablet Take 0.5 tablets (200 mg total) by mouth daily.  20 tablet  0  . aspirin EC 81 MG tablet Take 81 mg by mouth daily.       . calcium citrate-vitamin D (CITRACAL+D) 315-200 MG-UNIT per tablet Take 2 tablets by mouth 2 (two) times daily.       Marland Kitchen docusate sodium (COLACE) 100 MG capsule Take 100 mg  by mouth 2 (two) times daily.        . ferrous sulfate 325 (65 FE) MG tablet Take 325 mg by mouth 2 (two) times daily with a meal.      . furosemide (LASIX) 40 MG tablet Take 1 tablet (40 mg total) by mouth daily.  14 tablet  0  . losartan (COZAAR) 100 MG tablet Take 100 mg by mouth daily.      . metFORMIN (GLUCOPHAGE) 1000 MG tablet Take 1,000 mg by mouth 2 (two) times daily with a meal.      . pioglitazone (ACTOS) 45 MG tablet Take 45 mg  by mouth daily.      . pioglitazone (ACTOS) 45 MG tablet Take 1 tablet by mouth  daily  90 tablet  3  . Potassium Chloride ER 20 MEQ TBCR Take 20 mEq by mouth daily.  90 tablet  0  . simvastatin (ZOCOR) 20 MG tablet Take 1 tablet (20 mg total) by mouth every evening.  30 tablet  5  . warfarin (COUMADIN) 1 MG tablet Take 1 tablet (1 mg total) by mouth as directed.  90 tablet  0  . carvedilol (COREG) 6.25 MG tablet Take 12.5 mg by mouth 2 (two) times daily with a meal. Morning and afternoon      . cholecalciferol (VITAMIN D) 1000 UNITS tablet Take 2,000 Units by mouth daily.        . fish oil-omega-3 fatty acids 1000 MG capsule Take 1 g by mouth 2 (two) times daily.       . Garlic 1000 MG CAPS Take 1 capsule by mouth daily.        No facility-administered medications prior to visit.   Allergies  Allergen Reactions  . Other Hives    STEROIDS  . Prednisone Hives and Other (See Comments)    She is allergic to all steroids!   Patient Active Problem List   Diagnosis Date Noted  . Esophageal dysphagia 08/03/2013  . Acute renal failure 06/23/2013  . Atrial fibrillation 06/23/2013  . S/P aortic and mitral valve replacement with bioprosthetic valves 06/15/2013  . S/P mitral and aortic valve replacement with bioprosthetic valve 06/15/2013  . Aortic stenosis, severe 06/13/2013  . Mitral valve disorders 06/13/2013  . Mitral regurgitation   . Acute respiratory failure 05/09/2013  . Acute pulmonary edema 05/09/2013  . Routine health maintenance 11/13/2011  . Chronic systolic heart failure 09/26/2011  . Iron deficiency anemia 09/21/2011  . Aortic stenosis 09/21/2011  . CVA 03/14/2010  . AORTIC VALVE SCLEROSIS 02/21/2010  . PULMONARY HYPERTENSION 03/23/2009  . CAROTID ARTERY STENOSIS 01/05/2009  . SKIN RASH, ALLERGIC 04/07/2008  . Edema 04/07/2008  . 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 ARTHUS PHENOMENON 09/17/2007  . DM 07/30/2007  . HYPERLIPIDEMIA 07/30/2007  . HYPERTENSION 07/30/2007  .  BUNDLE BRANCH BLOCK, LEFT 07/30/2007  . PERIPHERAL VASCULAR DISEASE 07/30/2007  . POLYPECTOMY, HX OF 07/30/2007   History  Substance Use Topics  . Smoking status: Never Smoker   . Smokeless tobacco: Never Used  . Alcohol Use: No      family history includes Colon cancer in her mother. There is no history of Diabetes or Coronary artery disease.  Objective:   Physical Exam  white female in no acute distress, sitting in a wheelchair accompanied by her daughter blood pressure 164/64 pulse 70 height 5 foot 4 weight 126. HEENT; nontraumatic normocephalic EOMI PERRLA sclera anicteric, Supple; no JVD, Cardiovascular; regular rate and rhythm with S1-S2 she has valvular murmurs  and a healing sternal incisional scar, Pulmonary; clear bilaterally, Abdomen ;soft nontender nondistended bowel sounds are active there is no palpable mass or hepatosplenomegaly, Rectal; exam not done, Extremities; no clubbing cyanosis or edema skin warm and dry, Psych; mood and affect normal and appropriate        Assessment & Plan:  #67  79 year old white female 2 months status post aortic and mitral valve replacements-on Coumadin #2 intermittent nausea-suspect this may be medication related IE amiodarone #3 mild dysphagia-barium swallow suggestive of an underlying motility disorder, probable  Presbyesophagus-no clear stricture but cannot rule out #4 generalized weakness #5 diabetes mellitus #6 congestive heart failure improved #7 history of adenomatous colon polyps #8 family history of colon cancer-patient would be due for her five-year followup  Plan; start Prilosec 20 mg by mouth every morning  Zofran 4 mg q. 6-8 hours as needed for nausea Would like her to have a trial off of amiodarone to see if this is causing the nausea if okay with cardiology and CVTS .-Will contact Dr. Cornelius Moras Soft bland diet with chopped meats Upright position for a long also for least one hour postprandially Patient may eventually need an  upper endoscopy with esophageal dilation however she is still quite weak postoperatively and would like to give her a bit more time to regain her strength. Also esophageal dilation will require her coming off of Coumadin and having a Lovenox bridge. This was discussed with patient and daughter and they do not wish to pursue at this time. We'll plan to followup in the office in 2-3 weeks, daughter also mentions that they were told that she would only be on Coumadin for 4-6 months and they will have followup with Dr. Cornelius Moras in the upcoming weeks.

## 2013-08-22 NOTE — Telephone Encounter (Signed)
[name] daughter called to relate that her mother had seen her PCP and she felt the cause of her continued nausea and vomiting was her continued use of amiodarone.  At her last visit, Dr. Cornelius Moras had reduced it to once a day.  She saw Dr. Gala Romney recently and her ECHO report was good.  I told her I thought it appropriate to stop the amiodarone and I would inform Dr. Cornelius Moras.  She was pleased.

## 2013-08-23 ENCOUNTER — Telehealth: Payer: Self-pay | Admitting: *Deleted

## 2013-08-23 NOTE — Telephone Encounter (Signed)
Message copied by Daphine Deutscher on Tue Aug 23, 2013 11:12 AM ------      Message from: Mike Gip S      Created: Tue Aug 23, 2013  9:42 AM       Rene Kocher ,please call pt and daughter- let them know i spoke with Dr Cornelius Moras, and Dr. Clarise Cruz, and all agree it is fine to stop Amiodarone-  She had been very nauseated, and this is likely what is causing it ------

## 2013-08-23 NOTE — Telephone Encounter (Signed)
Spoke with patient's daughter [name] and gave her Mike Gip, [place] recommendations.

## 2013-08-25 NOTE — Progress Notes (Signed)
Agree with Ms. Esterwood's assessment and plan. Darneisha Windhorst E. Ashten Prats, MD, FACG   

## 2013-08-29 ENCOUNTER — Other Ambulatory Visit: Payer: Self-pay

## 2013-08-29 MED ORDER — SIMVASTATIN 20 MG PO TABS: 20.0000 mg | ORAL_TABLET | Freq: Every evening | ORAL | Status: DC

## 2013-09-01 ENCOUNTER — Encounter (HOSPITAL_COMMUNITY)
Admission: RE | Admit: 2013-09-01 | Discharge: 2013-09-01 | Disposition: A | Discharge status: Home / Self Care | Payer: Medicare Other | Source: Ambulatory Visit | Attending: Internal Medicine | Admitting: Internal Medicine

## 2013-09-01 DIAGNOSIS — I10 Essential (primary) hypertension: Secondary | ICD-10-CM | POA: Insufficient documentation

## 2013-09-01 DIAGNOSIS — Z952 Presence of prosthetic heart valve: Secondary | ICD-10-CM | POA: Insufficient documentation

## 2013-09-01 DIAGNOSIS — I739 Peripheral vascular disease, unspecified: Secondary | ICD-10-CM | POA: Insufficient documentation

## 2013-09-01 DIAGNOSIS — I5022 Chronic systolic (congestive) heart failure: Secondary | ICD-10-CM | POA: Insufficient documentation

## 2013-09-01 DIAGNOSIS — Z5189 Encounter for other specified aftercare: Secondary | ICD-10-CM | POA: Insufficient documentation

## 2013-09-01 DIAGNOSIS — E119 Type 2 diabetes mellitus without complications: Secondary | ICD-10-CM | POA: Insufficient documentation

## 2013-09-01 NOTE — Progress Notes (Signed)
Cardiac Rehab Medication Review by a Pharmacist  Does the patient  feel that his/her medications are working for him/her?  YES  Has the patient been experiencing any side effects to the medications prescribed?  NO  Does the patient measure his/her own blood pressure or blood glucose at home?  NO; does not have a BP cuff, would like to know if insurance will cover costs for cuff.  Has a glucose meter, but does not check.  Will try to start checking glucose.  Does the patient have any problems obtaining medications due to transportation or finances?   NO  Understanding of regimen: good Understanding of indications: good Potential of compliance: good    Pharmacist comments: Patient's daughter was present and is the one who takes care of patient's medical needs.  They reported patient is no longer taking a few of her medications and adjustments were made to the med list.  Will notify RN for insurance coverage for new cuff.  Patient does not take any other OTC or supplements and has no questions at this time.  Anabel Bene, PharmD Clinical Pharmacist Resident Pager: [phone]   Anabel Bene 09/01/2013 9:26 AM

## 2013-09-05 ENCOUNTER — Encounter (HOSPITAL_COMMUNITY)
Admission: RE | Admit: 2013-09-05 | Discharge: 2013-09-05 | Disposition: A | Discharge status: Home / Self Care | Payer: Medicare Other | Source: Ambulatory Visit | Attending: Internal Medicine | Admitting: Internal Medicine

## 2013-09-05 LAB — GLUCOSE, CAPILLARY: Glucose-Capillary: 109 mg/dL — ABNORMAL HIGH (ref 70–99)

## 2013-09-05 NOTE — Progress Notes (Signed)
Pt in today for her first exercise session at the 11:15 exercise time.  Pt tolerated light exercise with no complaints and did not experience any exertional fatigue.  Monitor showed sr with st depression which was present on a recent EKG.  Pt used the rolling walker for safety and demonstrated appropriately.  PHQ2 score 0. Pt reports positive outlook and appropriate coping skills.  Continue to monitor.

## 2013-09-07 ENCOUNTER — Ambulatory Visit (INDEPENDENT_AMBULATORY_CARE_PROVIDER_SITE_OTHER): Payer: Medicare Other | Admitting: Pharmacist

## 2013-09-07 ENCOUNTER — Encounter (HOSPITAL_COMMUNITY)
Admission: RE | Admit: 2013-09-07 | Discharge: 2013-09-07 | Disposition: A | Discharge status: Home / Self Care | Payer: Medicare Other | Source: Ambulatory Visit | Attending: Internal Medicine | Admitting: Internal Medicine

## 2013-09-07 DIAGNOSIS — I6350 Cerebral infarction due to unspecified occlusion or stenosis of unspecified cerebral artery: Secondary | ICD-10-CM

## 2013-09-07 DIAGNOSIS — Z953 Presence of xenogenic heart valve: Secondary | ICD-10-CM

## 2013-09-07 DIAGNOSIS — I4891 Unspecified atrial fibrillation: Secondary | ICD-10-CM

## 2013-09-07 DIAGNOSIS — Z952 Presence of prosthetic heart valve: Secondary | ICD-10-CM

## 2013-09-07 MED ORDER — WARFARIN SODIUM 1 MG PO TABS: ORAL_TABLET | ORAL | Status: DC

## 2013-09-07 NOTE — Progress Notes (Signed)
Joanna [name] blood sugar is 93. Patient given a banana and some lemonade. Joanna [name] will not exercise today. Encouraged patient to eat a snack prior to coming to exercise. Patient asymptomatic. Joanna [name] plans to return to exercise on Friday.

## 2013-09-08 ENCOUNTER — Telehealth (HOSPITAL_COMMUNITY): Payer: Self-pay | Admitting: Cardiology

## 2013-09-08 NOTE — Telephone Encounter (Signed)
Left message to call back  

## 2013-09-08 NOTE — Telephone Encounter (Signed)
Wanted to be sure to let Dr. Gala Romney know she is doing much better Pt d/c amiodarone by Dr. Barry Dienes Daughter would like RX for a Rollator so pt is able to ambulate more

## 2013-09-09 ENCOUNTER — Encounter (HOSPITAL_COMMUNITY)
Admission: RE | Admit: 2013-09-09 | Discharge: 2013-09-09 | Disposition: A | Discharge status: Home / Self Care | Payer: Medicare Other | Source: Ambulatory Visit | Attending: Internal Medicine | Admitting: Internal Medicine

## 2013-09-09 NOTE — Progress Notes (Signed)
Pt returned to exercise today at 11:15 exercise class time.  Pt had  Can of glucernia  Prior to exercise.  Pt pre exercise blood  sugare 101.  Pt given banana to eat during exercise.  Pt post exercise blood glucose - 121.  Pt with no complaints.

## 2013-09-12 ENCOUNTER — Ambulatory Visit (INDEPENDENT_AMBULATORY_CARE_PROVIDER_SITE_OTHER): Payer: Medicare Other | Admitting: General Practice

## 2013-09-12 ENCOUNTER — Encounter (HOSPITAL_COMMUNITY)
Admission: RE | Admit: 2013-09-12 | Discharge: 2013-09-12 | Disposition: A | Discharge status: Home / Self Care | Payer: Medicare Other | Source: Ambulatory Visit | Attending: Internal Medicine | Admitting: Internal Medicine

## 2013-09-12 DIAGNOSIS — Z952 Presence of prosthetic heart valve: Secondary | ICD-10-CM

## 2013-09-12 DIAGNOSIS — Z953 Presence of xenogenic heart valve: Secondary | ICD-10-CM

## 2013-09-12 DIAGNOSIS — I6350 Cerebral infarction due to unspecified occlusion or stenosis of unspecified cerebral artery: Secondary | ICD-10-CM

## 2013-09-12 DIAGNOSIS — I4891 Unspecified atrial fibrillation: Secondary | ICD-10-CM

## 2013-09-12 LAB — POCT INR: INR: 1.3

## 2013-09-12 LAB — GLUCOSE, CAPILLARY: Glucose-Capillary: 133 mg/dL — ABNORMAL HIGH (ref 70–99)

## 2013-09-12 NOTE — Telephone Encounter (Signed)
Per Dr bensimhon this is ok, pt's daughter aware and prescription given to her

## 2013-09-13 ENCOUNTER — Ambulatory Visit: Payer: Medicare Other | Admitting: Physician Assistant

## 2013-09-14 ENCOUNTER — Encounter (HOSPITAL_COMMUNITY): Payer: Medicare Other

## 2013-09-16 ENCOUNTER — Encounter (HOSPITAL_COMMUNITY): Payer: Medicare Other

## 2013-09-19 ENCOUNTER — Ambulatory Visit (INDEPENDENT_AMBULATORY_CARE_PROVIDER_SITE_OTHER): Payer: Self-pay | Admitting: Physician Assistant

## 2013-09-19 ENCOUNTER — Ambulatory Visit (INDEPENDENT_AMBULATORY_CARE_PROVIDER_SITE_OTHER): Payer: Medicare Other | Admitting: Pharmacist

## 2013-09-19 ENCOUNTER — Encounter (HOSPITAL_COMMUNITY)
Admission: RE | Admit: 2013-09-19 | Discharge: 2013-09-19 | Disposition: A | Discharge status: Home / Self Care | Payer: Medicare Other | Source: Ambulatory Visit | Attending: Internal Medicine | Admitting: Internal Medicine

## 2013-09-19 VITALS — BP 167/75 | HR 85 | Resp 16 | Ht 66.0 in | Wt 127.0 lb

## 2013-09-19 DIAGNOSIS — Z952 Presence of prosthetic heart valve: Secondary | ICD-10-CM

## 2013-09-19 DIAGNOSIS — I059 Rheumatic mitral valve disease, unspecified: Secondary | ICD-10-CM

## 2013-09-19 DIAGNOSIS — Z5189 Encounter for other specified aftercare: Secondary | ICD-10-CM | POA: Insufficient documentation

## 2013-09-19 DIAGNOSIS — I350 Nonrheumatic aortic (valve) stenosis: Secondary | ICD-10-CM

## 2013-09-19 DIAGNOSIS — E119 Type 2 diabetes mellitus without complications: Secondary | ICD-10-CM | POA: Insufficient documentation

## 2013-09-19 DIAGNOSIS — I10 Essential (primary) hypertension: Secondary | ICD-10-CM | POA: Insufficient documentation

## 2013-09-19 DIAGNOSIS — I739 Peripheral vascular disease, unspecified: Secondary | ICD-10-CM | POA: Insufficient documentation

## 2013-09-19 DIAGNOSIS — I5022 Chronic systolic (congestive) heart failure: Secondary | ICD-10-CM | POA: Insufficient documentation

## 2013-09-19 DIAGNOSIS — Z953 Presence of xenogenic heart valve: Secondary | ICD-10-CM

## 2013-09-19 DIAGNOSIS — I4891 Unspecified atrial fibrillation: Secondary | ICD-10-CM

## 2013-09-19 DIAGNOSIS — I359 Nonrheumatic aortic valve disorder, unspecified: Secondary | ICD-10-CM

## 2013-09-19 DIAGNOSIS — Z954 Presence of other heart-valve replacement: Secondary | ICD-10-CM

## 2013-09-19 DIAGNOSIS — I6350 Cerebral infarction due to unspecified occlusion or stenosis of unspecified cerebral artery: Secondary | ICD-10-CM

## 2013-09-19 DIAGNOSIS — I340 Nonrheumatic mitral (valve) insufficiency: Secondary | ICD-10-CM

## 2013-09-19 LAB — POCT INR: INR: 1.5

## 2013-09-19 LAB — GLUCOSE, CAPILLARY: Glucose-Capillary: 103 mg/dL — ABNORMAL HIGH (ref 70–99)

## 2013-09-19 MED ORDER — WARFARIN SODIUM 4 MG PO TABS: 4.0000 mg | ORAL_TABLET | Freq: Every day | ORAL | Status: DC

## 2013-09-19 NOTE — Progress Notes (Signed)
Reviewed home exercise with pt today.  Pt plans to continue walking at home for exercise.  Reviewed THR, pulse, RPE, sign and symptoms, and when to call 911 or MD.  Pt voiced understanding. Jessica Hawkins, MA, ACSM RCEP  

## 2013-09-19 NOTE — Progress Notes (Signed)
[address]       [name] [address]             [phone]          HPI: Patient returns for 2 month follow up. She is status post aortic and mitral valve replacement using a bioprosthetic tissue valves by Dr. Cornelius Moras on 06/15/2013. She also had left ventricular epicardial pacemaker leads placed at the time of surgery because of the presence of severe left ventricular dysfunction preoperatively anticipating the possibility that the patient might benefit from biventricular pacing in the future. Postoperatively, the patient developed recurrent paroxysmal atrial fibrillation for which she was treated with amiodarone. Prior to hospital discharge, the patient was maintaining sinus rhythm. She was discharged to [facility], but has since returned home.  She had significant issues with nausea and vomiting, which was felt to be related to her Amiodarone therapy.  The dose was decreased with minimal improvement in her symptoms, and ultimately the Amiodarone was discontinued altogether.  Since then, she reports that her GI symptoms have completely resolved.  Her appetite has significantly improved, and she is feeling overall much better.  She is going to cardiac rehab three times a week and is having no issues with ambulating. She denies shortness of breath, chest pain, or palpitations.  She does have chronic lower extremity edema, and her daughter recently increased her Lasix to every other day with improvement.    She saw Dr. Gala Romney on 10/31 with a repeat echo.  This showed EF 50-55% with moderate LVH, septal-lateral dyssynchrony, and normal prosthetic valve function. Her most recent INR was 1.5, and Coumadin dose was increased to 4 mg daily.  She will have this repeated today.  Overall, she and her daughter feel that she has made drastic improvements, especially over the past 2 weeks.    Current Outpatient Prescriptions  Medication Sig Dispense Refill  .  aspirin EC 81 MG tablet Take 81 mg by mouth daily.       . carvedilol (COREG) 6.25 MG tablet TAKE 1 TABLET BY MOUTH TWICE A DAY WITH MEALS  60 tablet  5  . docusate sodium (COLACE) 100 MG capsule Take 100 mg by mouth 2 (two) times daily.        . ferrous sulfate 325 (65 FE) MG tablet Take 325 mg by mouth 2 (two) times daily with a meal.      . furosemide (LASIX) 40 MG tablet Take 40 mg by mouth every other day. Currently takes two times a week on thurs/sun      . lactose free nutrition (BOOST) LIQD Take 237 mLs by mouth daily.      Marland Kitchen losartan (COZAAR) 100 MG tablet Take 100 mg by mouth daily.      . metFORMIN (GLUCOPHAGE) 1000 MG tablet Take 1,000 mg by mouth 2 (two) times daily with a meal.      . omeprazole (PRILOSEC) 20 MG capsule Take 1 capsule (20 mg total) by mouth every morning.  30 capsule  3  . pioglitazone (ACTOS) 45 MG tablet Take 45 mg by mouth daily.      . Potassium Chloride ER 20 MEQ TBCR Take 20 mEq by mouth daily.  90 tablet  0  . simvastatin (ZOCOR) 20 MG tablet Take 1 tablet (20 mg total) by mouth every evening.  90 tablet  3  . warfarin (COUMADIN) 4 MG tablet Take 4 mg by mouth daily. OR  AS DIRECTED       No current facility-administered medications for this visit.     Physical Exam: BP 167/75 HR 85 Resp 16 Wounds: Well healed  Heart: Regular rate and rhythm Lungs: Clear to auscultation Extremities: 1+ LE edema   Diagnostic Tests: Chest xray: No results found.     Assessment/Plan: The patient looks great today.  She is finally eating better and her mobility is improved. Her blood pressure is a bit elevated today, although she just came from cardiac rehab and her blood pressure was reportedly normal there.  She has a follow up with cardiology later this week, so they can hopefully evaluate her meds further at that time.  From a surgical standpoint, she is doing well.  We will see her back in 1-2 months for recheck.

## 2013-09-21 ENCOUNTER — Encounter (HOSPITAL_COMMUNITY)
Admission: RE | Admit: 2013-09-21 | Discharge: 2013-09-21 | Disposition: A | Discharge status: Home / Self Care | Payer: Medicare Other | Source: Ambulatory Visit | Attending: Internal Medicine | Admitting: Internal Medicine

## 2013-09-22 ENCOUNTER — Ambulatory Visit (HOSPITAL_COMMUNITY)
Admission: RE | Admit: 2013-09-22 | Discharge: 2013-09-22 | Disposition: A | Discharge status: Home / Self Care | Payer: Medicare Other | Source: Ambulatory Visit | Attending: Cardiology | Admitting: Cardiology

## 2013-09-22 ENCOUNTER — Telehealth: Payer: Self-pay | Admitting: *Deleted

## 2013-09-22 VITALS — BP 152/68 | HR 85 | Wt 146.5 lb

## 2013-09-22 DIAGNOSIS — I447 Left bundle-branch block, unspecified: Secondary | ICD-10-CM | POA: Insufficient documentation

## 2013-09-22 DIAGNOSIS — E785 Hyperlipidemia, unspecified: Secondary | ICD-10-CM

## 2013-09-22 DIAGNOSIS — I739 Peripheral vascular disease, unspecified: Secondary | ICD-10-CM | POA: Insufficient documentation

## 2013-09-22 DIAGNOSIS — Z953 Presence of xenogenic heart valve: Secondary | ICD-10-CM

## 2013-09-22 DIAGNOSIS — I129 Hypertensive chronic kidney disease with stage 1 through stage 4 chronic kidney disease, or unspecified chronic kidney disease: Secondary | ICD-10-CM | POA: Insufficient documentation

## 2013-09-22 DIAGNOSIS — R609 Edema, unspecified: Secondary | ICD-10-CM | POA: Insufficient documentation

## 2013-09-22 DIAGNOSIS — I509 Heart failure, unspecified: Secondary | ICD-10-CM | POA: Insufficient documentation

## 2013-09-22 DIAGNOSIS — I6529 Occlusion and stenosis of unspecified carotid artery: Secondary | ICD-10-CM | POA: Insufficient documentation

## 2013-09-22 DIAGNOSIS — I080 Rheumatic disorders of both mitral and aortic valves: Secondary | ICD-10-CM | POA: Insufficient documentation

## 2013-09-22 DIAGNOSIS — I5022 Chronic systolic (congestive) heart failure: Secondary | ICD-10-CM

## 2013-09-22 DIAGNOSIS — Z952 Presence of prosthetic heart valve: Secondary | ICD-10-CM | POA: Insufficient documentation

## 2013-09-22 DIAGNOSIS — Z8673 Personal history of transient ischemic attack (TIA), and cerebral infarction without residual deficits: Secondary | ICD-10-CM | POA: Insufficient documentation

## 2013-09-22 DIAGNOSIS — E119 Type 2 diabetes mellitus without complications: Secondary | ICD-10-CM | POA: Insufficient documentation

## 2013-09-22 DIAGNOSIS — I4891 Unspecified atrial fibrillation: Secondary | ICD-10-CM

## 2013-09-22 DIAGNOSIS — Z7901 Long term (current) use of anticoagulants: Secondary | ICD-10-CM | POA: Insufficient documentation

## 2013-09-22 DIAGNOSIS — N189 Chronic kidney disease, unspecified: Secondary | ICD-10-CM | POA: Insufficient documentation

## 2013-09-22 DIAGNOSIS — I5032 Chronic diastolic (congestive) heart failure: Secondary | ICD-10-CM

## 2013-09-22 LAB — BASIC METABOLIC PANEL
BUN: 29 mg/dL — ABNORMAL HIGH (ref 6–23)
Calcium: 10 mg/dL (ref 8.4–10.5)
Creatinine, Ser: 1.08 mg/dL (ref 0.50–1.10)
GFR calc non Af Amer: 47 mL/min — ABNORMAL LOW (ref >90)
Glucose, Bld: 120 mg/dL — ABNORMAL HIGH (ref 70–99)
Potassium: 4.6 mEq/L (ref 3.5–5.1)
Sodium: 140 mEq/L (ref 135–145)

## 2013-09-22 MED ORDER — FUROSEMIDE 40 MG PO TABS: 40.0000 mg | ORAL_TABLET | Freq: Every day | ORAL | Status: DC

## 2013-09-22 MED ORDER — SITAGLIPTIN PHOSPHATE 50 MG PO TABS: 50.0000 mg | ORAL_TABLET | Freq: Every day | ORAL | Status: DC

## 2013-09-22 NOTE — Telephone Encounter (Signed)
Called states while at Cardiologist appoint today, Cardiologist stated he wants to take pt off Actos.  Family member wanting to discuss with Dr Debby Bud.  Please advise

## 2013-09-22 NOTE — Telephone Encounter (Signed)
Spoke with pt advised of MDs message 

## 2013-09-22 NOTE — Progress Notes (Signed)
Patient ID: Joanna [name], female   DOB: [birth year], 79 y.o.   MRN: [id number]  PCP: Dr Debby Bud  HPI: Joanna [name] is a 79 year old woman with aortic stenosis s/p AVR/mitral valve replacement (06/15/2013), systolic HF due to NICM, hypertension, diabetes mellitus type II, hyperlipidemia, and peripheral artery disease. Underwent AVR/MVR with placement of epicardial lead on 06/15/13.    Admitted initially in 12/12 with acute systolic HF, EF 30%. Cath with normal coronaries and normal cardiac output (see below). AS was mild to moderate. In hospital also found to be iron-deficiency anemia. Previous colonoscopies normal. Seen by GI who suggested trial of iron and see if it improved. If not, repeat endo. Stool guaiac was negative.   RA = 8 RV = 58/2/11 PA = 54/22 (37) PCW = 20 Fick cardiac output/index = 4.57/2.67 PVR = 3.7 Woods   12/18/11 ECHO EF 30-35% mod AS mean gradient 27 03/26/12 ECHO EF 35% mod AS mean gradient 27  05/09/13 ECHO EF 30%, mild LVH, moderate MR, septal-lateral dyssynchrony, suspect severe AS with AVA 0.6 and peak/mean gradient 63/33 mmHg.   05/19/2013 ECHO EF 35% severe AS (low gradient severe AS confirmed by DSE), gradient: 32mm Hg (S). Peak gradient: 57mm Hg  08/18/13 ECHO EF 45-50% AV working well  S/P AVR/MVR with placement of epicardial lead on 06/15/13. She had post-op atrial fibrillation that terminated with amiodarone.  She is off amiodarone now due to nausea. Was discharged from the hospital 06/27/13.   She returns for follow up. She has been discharged back to [name]. Lives alone but daughter very involved in her care. Overall, she feels like she is doing better.  She is doing cardiac rehab.  However, her diet has been very high in sodium (soups, pizza, [place] food).  She has gained 21 lbs.  She has noted that her lower leg edema has increased over the last 2 wks.  Daughter increased her Lasix to every other day.  In terms of symptoms, she is still doing well.  She can  walk on flat ground without dyspnea.  + cough.  No orthopnea, PND, or chest pain.  She has not felt tachypalpitations and is in NSR today by exam.   Labs (9/14): K 5.4, creatinine 1.69  ROS: All systems negative except as listed in HPI, PMH and Problem List.  Past Medical History  Diagnosis Date  . Peripheral artery disease   . Hyperlipidemia     takes Simvastatin daily  . PONV (postoperative nausea and vomiting)   . Shortness of breath   . Arthritis   . Aortic stenosis 09/21/2011  . AORTIC VALVE SCLEROSIS 02/21/2010  . 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 BUNDLE BRANCH BLOCK, LEFT 07/30/2007  . CAROTID ARTERY STENOSIS 01/05/2009  . CVA 03/14/2010  . 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 PERIPHERAL VASCULAR DISEASE 07/30/2007  . POLYPECTOMY, HX OF 07/30/2007  . Constipation     takes stool softener daily  . CHF (congestive heart failure)     takes lasix daily  . Diabetes mellitus     takes metformin daily and actos   . DM 07/30/2007  . HTN (hypertension)     takes Carvedilol daily and losartan  . HYPERTENSION 07/30/2007  . Weakness     both hands  . Dizziness   . Joint pain   . Itching     on legs d/t dry skin  . Anemia     takes Iron pill daily  . Iron deficiency anemia 09/21/2011  .  Nocturia   . History of blood transfusion     no abnormal reaction noted  . History of shingles   . Mitral regurgitation   . S/P aortic valve replacement with bioprosthetic valve 06/15/2013    21 mm [facility] Ease bovine pericardial tissue valve  . S/P mitral valve replacement with bioprosthetic valve 06/15/2013    25 mm [facility] Mitral bovine pericardial tissue valve     Current Outpatient Prescriptions  Medication Sig Dispense Refill  . carvedilol (COREG) 6.25 MG tablet TAKE 1 TABLET BY MOUTH TWICE A DAY WITH MEALS  60 tablet  5  . docusate sodium (COLACE) 100 MG capsule Take 100 mg by mouth 2 (two) times daily.        . ferrous sulfate 325 (65 FE) MG tablet Take 325 mg by mouth 2  (two) times daily with a meal.      . furosemide (LASIX) 40 MG tablet Take 1 tablet (40 mg total) by mouth daily.  30 tablet    . lactose free nutrition (BOOST) LIQD Take 237 mLs by mouth daily.      Marland Kitchen losartan (COZAAR) 100 MG tablet Take 100 mg by mouth daily.      . metFORMIN (GLUCOPHAGE) 1000 MG tablet Take 1,000 mg by mouth 2 (two) times daily with a meal.      . omeprazole (PRILOSEC) 20 MG capsule Take 1 capsule (20 mg total) by mouth every morning.  30 capsule  3  . pioglitazone (ACTOS) 45 MG tablet Take 45 mg by mouth daily.      . Potassium Chloride ER 20 MEQ TBCR Take 20 mEq by mouth daily.  90 tablet  0  . simvastatin (ZOCOR) 20 MG tablet Take 1 tablet (20 mg total) by mouth every evening.  90 tablet  3  . warfarin (COUMADIN) 4 MG tablet Take 4 mg by mouth daily. OR AS DIRECTED       No current facility-administered medications for this encounter.    Filed Vitals:   09/22/13 0921  BP: 152/68  Pulse: 85  Weight: 146 lb 8 oz (66.452 kg)  SpO2: 93%    PHYSICAL EXAM: General:  Elderly Fatigued appearing. Pale No resp difficulty daughter present HEENT: normal Neck: supple. JVP 10-12 cm. Carotids 2+ bilaterally; no bruits. No lymphadenopathy or thryomegaly appreciated. Cor: PMI normal. Regular rate & rhythm.  Paradoxical S2 split.  2/6 SEM. Sternotomy scar well-healed Lungs: clear Abdomen: soft, nontender, nondistended. No hepatosplenomegaly. No bruits or masses. Good bowel sounds. Extremities: no cyanosis, clubbing, rash, R and LLE 2+ edema to knees.  Neuro: alert & orientedx3, cranial nerves grossly intact. Moves all 4 extremities w/o difficulty. Affect pleasant  ASSESSMENT & PLAN: 1. Chronic diastolic CHF: EF improved to 50-55% on echo post-AVR/MVR.  NYHA class II symptoms but more volume overloaded today.  Of note, she has LBBB and has epicardial leads if BiV pacing is needed in the future.   - Increase Lasix to 40 qam/20 qpm x 4 days, then take Lasix 40 mg daily.  Will get  BMET/BNP today and have her followup in 2 wks with BMET/BNP.  - Given CHF, she should be off Actos.  Her daughter will contact Dr Debby Bud for an alternative medication for her diabetes.   2. Carotid stenosis: Carotid dopplers 2/14 with 40-59% RICA stenosis, repeat in 2/15.   3. Hyperlipidemia: Given known vascular disease (carotid stenosis), continue statin.   4. Aortic stenosi/mitral regurg: s/p bioprosthetic AVR/MVR.  Well-seated valves on  post-op echo.   5. CKD: BMET today as above.   6. Paroxysmal atrial fibrillation: Noted post-op.  She is in NSR today by exam.  She is off amiodarone due to nausea.  She is on warfarin.  She can stop ASA 81 given warfarin use.    Umar Patmon,MD 10:24 AM 09/22/2013

## 2013-09-22 NOTE — Telephone Encounter (Signed)
actos not a great drug for patients with heart failure. I agree with stopping actos.  Will send in Rx for januvia 50 mg daily ( may have samples for a trial if we have them).

## 2013-09-22 NOTE — Telephone Encounter (Signed)
Left message to return call 

## 2013-09-22 NOTE — Patient Instructions (Signed)
Stop Aspirin  Increase Furosemide (Lasix) to 40 mg in AM and 20 mg (1/2 tab) in PM for 4 DAYS ONLY, then decrease to Furosemide 40 mg daily  Call Dr Debby Bud to discuss changing your Actos to another medication, per Dr Alford Highland recommendation  Your physician recommends that you schedule a follow-up appointment in: 2 weeks with labs

## 2013-09-23 ENCOUNTER — Encounter (HOSPITAL_COMMUNITY)
Admission: RE | Admit: 2013-09-23 | Discharge: 2013-09-23 | Disposition: A | Discharge status: Home / Self Care | Payer: Medicare Other | Source: Ambulatory Visit | Attending: Internal Medicine | Admitting: Internal Medicine

## 2013-09-26 ENCOUNTER — Ambulatory Visit (INDEPENDENT_AMBULATORY_CARE_PROVIDER_SITE_OTHER): Payer: Medicare Other | Admitting: Pharmacist

## 2013-09-26 ENCOUNTER — Encounter (HOSPITAL_COMMUNITY)
Admission: RE | Admit: 2013-09-26 | Discharge: 2013-09-26 | Disposition: A | Discharge status: Home / Self Care | Payer: Medicare Other | Source: Ambulatory Visit | Attending: Internal Medicine | Admitting: Internal Medicine

## 2013-09-26 DIAGNOSIS — I6350 Cerebral infarction due to unspecified occlusion or stenosis of unspecified cerebral artery: Secondary | ICD-10-CM

## 2013-09-26 DIAGNOSIS — I4891 Unspecified atrial fibrillation: Secondary | ICD-10-CM

## 2013-09-26 DIAGNOSIS — Z953 Presence of xenogenic heart valve: Secondary | ICD-10-CM

## 2013-09-26 DIAGNOSIS — Z952 Presence of prosthetic heart valve: Secondary | ICD-10-CM

## 2013-09-26 LAB — POCT INR: INR: 1.5

## 2013-09-28 ENCOUNTER — Encounter (HOSPITAL_COMMUNITY)
Admission: RE | Admit: 2013-09-28 | Discharge: 2013-09-28 | Disposition: A | Discharge status: Home / Self Care | Payer: Medicare Other | Source: Ambulatory Visit | Attending: Internal Medicine | Admitting: Internal Medicine

## 2013-09-28 LAB — GLUCOSE, CAPILLARY: Glucose-Capillary: 106 mg/dL — ABNORMAL HIGH (ref 70–99)

## 2013-09-28 LAB — HM DIABETES EYE EXAM

## 2013-09-30 ENCOUNTER — Encounter (HOSPITAL_COMMUNITY)
Admission: RE | Admit: 2013-09-30 | Discharge: 2013-09-30 | Disposition: A | Discharge status: Home / Self Care | Payer: Medicare Other | Source: Ambulatory Visit | Attending: Internal Medicine | Admitting: Internal Medicine

## 2013-09-30 LAB — GLUCOSE, CAPILLARY: Glucose-Capillary: 130 mg/dL — ABNORMAL HIGH (ref 70–99)

## 2013-10-03 ENCOUNTER — Encounter (HOSPITAL_COMMUNITY)
Admission: RE | Admit: 2013-10-03 | Discharge: 2013-10-03 | Disposition: A | Discharge status: Home / Self Care | Payer: Medicare Other | Source: Ambulatory Visit | Attending: Internal Medicine | Admitting: Internal Medicine

## 2013-10-03 ENCOUNTER — Ambulatory Visit (INDEPENDENT_AMBULATORY_CARE_PROVIDER_SITE_OTHER): Payer: Medicare Other | Admitting: *Deleted

## 2013-10-03 DIAGNOSIS — Z953 Presence of xenogenic heart valve: Secondary | ICD-10-CM

## 2013-10-03 DIAGNOSIS — I6350 Cerebral infarction due to unspecified occlusion or stenosis of unspecified cerebral artery: Secondary | ICD-10-CM

## 2013-10-03 DIAGNOSIS — I4891 Unspecified atrial fibrillation: Secondary | ICD-10-CM

## 2013-10-03 DIAGNOSIS — Z952 Presence of prosthetic heart valve: Secondary | ICD-10-CM

## 2013-10-03 LAB — GLUCOSE, CAPILLARY: Glucose-Capillary: 120 mg/dL — ABNORMAL HIGH (ref 70–99)

## 2013-10-03 LAB — POCT INR: INR: 1.5

## 2013-10-03 NOTE — Progress Notes (Signed)
Joanna [name] 79 y.o. female Nutrition Note Spoke with pt.  Nutrition Plan and Nutrition Survey goals reviewed with pt. Pt is following Step 2 of the Therapeutic Lifestyle Changes diet. Pt is diabetic. Last A1c indicates blood glucose fairly well-controlled for pt's age. Pt checks fasting CBG's every morning. This Clinical research associate went over Diabetes Education test results. Pt is on Coumadin and is aware of the need to follow a diet consistent in vitamin K. Pt reports she has watched her sodium intake for "a long time." Pt chooses mostly fresh/frozen vegetables and does not add salt at the table. Pt expressed understanding of the information reviewed. Pt aware of nutrition education classes offered and plans on attending nutrition classes.  Nutrition Diagnosis   Food-and nutrition-related knowledge deficit related to lack of exposure to information as related to diagnosis of: ? CVD ? DM (A1c 7.6)  Nutrition RX/ Estimated Daily Nutrition Needs for: wt maintenance 1450-1650 Kcal, 45-55 gm fat, 9-11 gm sat fat, 1.4-1.6 gm trans-fat, <1500 mg sodium, 175 gm CHO   Nutrition Intervention   Pt's individual nutrition plan reviewed with pt.   Benefits of adopting Therapeutic Lifestyle Changes discussed when Medficts reviewed.   Pt to attend the Portion Distortion class   Pt to attend the  ? Nutrition I class                     ? Nutrition II class        ? Diabetes Blitz class       ? Diabetes Q & A class   Continue client-centered nutrition education by RD, as part of interdisciplinary care. Goal(s)   Pt to describe the benefit of including fruits, vegetables, whole grains, and low-fat dairy products in a heart healthy meal plan.   CBG concentrations in the normal range or as close to normal as is safely possible. Monitor and Evaluate progress toward nutrition goal with team. Nutrition Risk: Change to Moderate Mickle Plumb, M.Ed, RD, LDN, CDE 10/03/2013 11:39 AM

## 2013-10-05 ENCOUNTER — Encounter (HOSPITAL_COMMUNITY): Payer: Self-pay

## 2013-10-05 ENCOUNTER — Encounter (HOSPITAL_COMMUNITY)
Admission: RE | Admit: 2013-10-05 | Discharge: 2013-10-05 | Disposition: A | Discharge status: Home / Self Care | Payer: Medicare Other | Source: Ambulatory Visit | Attending: Internal Medicine | Admitting: Internal Medicine

## 2013-10-05 ENCOUNTER — Ambulatory Visit (HOSPITAL_COMMUNITY)
Admission: RE | Admit: 2013-10-05 | Discharge: 2013-10-05 | Disposition: A | Discharge status: Home / Self Care | Payer: Medicare Other | Source: Ambulatory Visit | Attending: Internal Medicine | Admitting: Internal Medicine

## 2013-10-05 VITALS — BP 146/72 | HR 94 | Wt 139.5 lb

## 2013-10-05 DIAGNOSIS — I059 Rheumatic mitral valve disease, unspecified: Secondary | ICD-10-CM | POA: Insufficient documentation

## 2013-10-05 DIAGNOSIS — I340 Nonrheumatic mitral (valve) insufficiency: Secondary | ICD-10-CM

## 2013-10-05 DIAGNOSIS — I509 Heart failure, unspecified: Secondary | ICD-10-CM

## 2013-10-05 DIAGNOSIS — I5022 Chronic systolic (congestive) heart failure: Secondary | ICD-10-CM

## 2013-10-05 DIAGNOSIS — I4891 Unspecified atrial fibrillation: Secondary | ICD-10-CM

## 2013-10-05 DIAGNOSIS — I5032 Chronic diastolic (congestive) heart failure: Secondary | ICD-10-CM

## 2013-10-05 DIAGNOSIS — I5042 Chronic combined systolic (congestive) and diastolic (congestive) heart failure: Secondary | ICD-10-CM | POA: Insufficient documentation

## 2013-10-05 DIAGNOSIS — I10 Essential (primary) hypertension: Secondary | ICD-10-CM

## 2013-10-05 DIAGNOSIS — I6529 Occlusion and stenosis of unspecified carotid artery: Secondary | ICD-10-CM | POA: Insufficient documentation

## 2013-10-05 LAB — PRO B NATRIURETIC PEPTIDE: Pro B Natriuretic peptide (BNP): 11859 pg/mL — ABNORMAL HIGH (ref 0–450)

## 2013-10-05 LAB — BASIC METABOLIC PANEL
BUN: 30 mg/dL — ABNORMAL HIGH (ref 6–23)
CO2: 28 mEq/L (ref 19–32)
GFR calc Af Amer: 53 mL/min — ABNORMAL LOW (ref >90)
GFR calc non Af Amer: 46 mL/min — ABNORMAL LOW (ref >90)
Potassium: 4.7 mEq/L (ref 3.5–5.1)
Sodium: 140 mEq/L (ref 135–145)

## 2013-10-05 MED ORDER — AMLODIPINE BESYLATE 5 MG PO TABS: 5.0000 mg | ORAL_TABLET | Freq: Every day | ORAL | Status: DC

## 2013-10-05 MED ORDER — FUROSEMIDE 40 MG PO TABS: 40.0000 mg | ORAL_TABLET | Freq: Two times a day (BID) | ORAL | Status: DC

## 2013-10-05 MED ORDER — POTASSIUM CHLORIDE ER 20 MEQ PO TBCR: 20.0000 meq | EXTENDED_RELEASE_TABLET | Freq: Two times a day (BID) | ORAL | Status: DC

## 2013-10-05 NOTE — Progress Notes (Signed)
Patient ID: Joanna [name], female   DOB: 10-Dec-1929, 79 y.o.   MRN: [id number]  PCP: Dr Debby Bud  HPI: Ms. Kenagy is a 79 year old woman with aortic stenosis s/p AVR/mitral valve replacement (06/15/2013), systolic HF due to NICM, hypertension, diabetes mellitus type II, hyperlipidemia, and peripheral artery disease. Underwent AVR/MVR with placement of epicardial lead on 06/15/13.    Admitted initially in 09/2011 with acute systolic HF, EF 30%. Cath with normal coronaries and normal cardiac output (see below). AS was mild to moderate. In hospital also found to be iron-deficiency anemia. Previous colonoscopies normal. Seen by GI who suggested trial of iron and see if it improved. If not, repeat endo. Stool guaiac was negative.   RA = 8 RV = 58/2/11 PA = 54/22 (37) PCW = 20 Fick cardiac output/index = 4.57/2.67 PVR = 3.7 Woods   12/18/11 ECHO EF 30-35% mod AS mean gradient 27 03/26/12 ECHO EF 35% mod AS mean gradient 27  05/09/13 ECHO EF 30%, mild LVH, moderate MR, septal-lateral dyssynchrony, suspect severe AS with AVA 0.6 and peak/mean gradient 63/33 mmHg.   05/19/2013 ECHO EF 35% severe AS (low gradient severe AS confirmed by DSE), gradient: 32mm Hg (S). Peak gradient: 57mm Hg  08/18/13 ECHO EF 45-50% AV working well  S/P AVR/MVR with placement of epicardial lead on 06/15/13. She had post-op atrial fibrillation that terminated with amiodarone.  She is off amiodarone now due to nausea. Was discharged from the hospital 06/27/13.   Follow up: Last visit she was volume overloaded and increased lasix 40 mg q am 20 mg q pm x 4 days and then back to 40 mg daily. We also stopped ASA because she was on Coumadin. She is participating in CR 3 days a week. Feeling good. Denies SOB, PND, orthopnea, or CP. + palpitations. DOE with walking from the parking deck to clinic. + LE edema. Following low salt diet and drinking less than 2L a day.   Labs (9/14): K 5.4, creatinine 1.69    (09/2013): K+ 4.6, creatinine 1.08, pro-BNP  11643  ROS: All systems negative except as listed in HPI, PMH and Problem List.  Past Medical History  Diagnosis Date  . Peripheral artery disease   . Hyperlipidemia     takes Simvastatin daily  . PONV (postoperative nausea and vomiting)   . Shortness of breath   . Arthritis   . Aortic stenosis 09/21/2011  . AORTIC VALVE SCLEROSIS 02/21/2010  . 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 BUNDLE BRANCH BLOCK, LEFT 07/30/2007  . CAROTID ARTERY STENOSIS 01/05/2009  . CVA 03/14/2010  . 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 PERIPHERAL VASCULAR DISEASE 07/30/2007  . POLYPECTOMY, HX OF 07/30/2007  . Constipation     takes stool softener daily  . CHF (congestive heart failure)     takes lasix daily  . Diabetes mellitus     takes metformin daily and actos   . DM 07/30/2007  . HTN (hypertension)     takes Carvedilol daily and losartan  . HYPERTENSION 07/30/2007  . Weakness     both hands  . Dizziness   . Joint pain   . Itching     on legs d/t dry skin  . Anemia     takes Iron pill daily  . Iron deficiency anemia 09/21/2011  . Nocturia   . History of blood transfusion     no abnormal reaction noted  . History of shingles   . Mitral regurgitation   . S/P aortic valve replacement with  bioprosthetic valve 06/15/2013    21 mm [facility] Ease bovine pericardial tissue valve  . S/P mitral valve replacement with bioprosthetic valve 06/15/2013    25 mm [facility] Mitral bovine pericardial tissue valve     Current Outpatient Prescriptions  Medication Sig Dispense Refill  . carvedilol (COREG) 6.25 MG tablet TAKE 1 TABLET BY MOUTH TWICE A DAY WITH MEALS  60 tablet  5  . docusate sodium (COLACE) 100 MG capsule Take 100 mg by mouth 2 (two) times daily.        . ferrous sulfate 325 (65 FE) MG tablet Take 325 mg by mouth 2 (two) times daily with a meal.      . furosemide (LASIX) 40 MG tablet Take 1 tablet (40 mg total) by mouth daily.  30 tablet    . lactose free nutrition (BOOST) LIQD Take 237 mLs by  mouth daily.      Marland Kitchen losartan (COZAAR) 100 MG tablet Take 100 mg by mouth daily.      . metFORMIN (GLUCOPHAGE) 1000 MG tablet Take 1,000 mg by mouth 2 (two) times daily with a meal.      . Potassium Chloride ER 20 MEQ TBCR Take 20 mEq by mouth daily.  90 tablet  0  . simvastatin (ZOCOR) 20 MG tablet Take 1 tablet (20 mg total) by mouth every evening.  90 tablet  3  . sitaGLIPtin (JANUVIA) 50 MG tablet Take 1 tablet (50 mg total) by mouth daily.  30 tablet  11  . warfarin (COUMADIN) 4 MG tablet Take 4 mg by mouth daily. OR AS DIRECTED       No current facility-administered medications for this encounter.    Filed Vitals:   10/05/13 0914  BP: 146/72  Pulse: 94  Weight: 139 lb 8 oz (63.277 kg)  SpO2: 95%    PHYSICAL EXAM: General:  Elderly. No resp difficulty daughter present HEENT: normal Neck: supple. JVP to jaw. Carotids 2+ bilaterally; no bruits. No lymphadenopathy or thryomegaly appreciated. Cor: PMI normal. Regular rate & rhythm.  Paradoxical S2 split.  2/6 SEM. Sternotomy scar well-healed Lungs: clear Abdomen: soft, nontender, nondistended. No hepatosplenomegaly. No bruits or masses. Good bowel sounds. Extremities: no cyanosis, clubbing, rash, R and LLE 2+ edema to knees; TED hose intact  Neuro: alert & orientedx3, cranial nerves grossly intact. Moves all 4 extremities w/o difficulty. Affect pleasant  ASSESSMENT & PLAN:  1. Chronic diastolic CHF: EF improved to 50-55% (07/2013) post-AVR/MVR.  - NYHA class II symptoms and continues to be volume overloaded even with increase in lasix from last visit. Will increase lasix to 40 mg BID and KDUR to 20 meq BID. Will get BMET and pro-BNP in 2 weeks. She has LBBB and has epicardial leads if BiV pacing is needed in the future.   - Reinforced the need and importance of daily weights, a low sodium diet, and fluid restriction (less than 2 L a day). Instructed to call the HF clinic if weight increases more than 3 lbs overnight or 5 lbs in a  week.  2. Carotid stenosis: Carotid dopplers 2/14 with 40-59% RICA stenosis, repeat in 11/2013. Will order on next visit.  3. Hyperlipidemia: Given known vascular disease (carotid stenosis), continue statin.  4. Aortic stenosi/mitral regurg: s/p bioprosthetic AVR/MVR.  Well-seated valves on post-op echo (07/2013) 5. CKD: Last Cr stable. Will recheck 2 weeks with increase in diuretics.  6. Paroxysmal atrial fibrillation: Noted post-op. She is in NSR today by exam.  She is  off amiodarone due to nausea. Continue on coumadin.  7. HTN: Elevated. Will continue current dose BB and losartan. Started norvasc 5 mg today. Looking back, should have probably titrated BB with HR and BP. Will assess at next visit.   F/U 1 month Aundria Rud, NP-C 9:21 AM 10/05/2013

## 2013-10-05 NOTE — Patient Instructions (Signed)
Start taking norvasc 5 mg daily.  Increase lasix to 40 mg twice a day. You can take the second dose before 5 pm so you are not up all night going to the bathroom  Increase your potassium 20 meq twice a day.  Got get labs checked in 2 weeks.  Follow up in 1 month.  Have a wonderful Christmas and New Year.  Do the following things EVERYDAY: 1) Weigh yourself in the morning before breakfast. Write it down and keep it in a log. 2) Take your medicines as prescribed 3) Eat low salt foods-Limit salt (sodium) to 2000 mg per day.  4) Stay as active as you can everyday 5) Limit all fluids for the day to less than 2 liters 6)

## 2013-10-07 ENCOUNTER — Encounter (HOSPITAL_COMMUNITY)
Admission: RE | Admit: 2013-10-07 | Discharge: 2013-10-07 | Disposition: A | Discharge status: Home / Self Care | Payer: Medicare Other | Source: Ambulatory Visit | Attending: Internal Medicine | Admitting: Internal Medicine

## 2013-10-07 LAB — GLUCOSE, CAPILLARY
Glucose-Capillary: 90 mg/dL (ref 70–99)
Glucose-Capillary: 141 mg/dL — ABNORMAL HIGH (ref 70–99)

## 2013-10-10 ENCOUNTER — Encounter (HOSPITAL_COMMUNITY)
Admission: RE | Admit: 2013-10-10 | Discharge: 2013-10-10 | Disposition: A | Discharge status: Home / Self Care | Payer: Medicare Other | Source: Ambulatory Visit | Attending: Internal Medicine | Admitting: Internal Medicine

## 2013-10-10 ENCOUNTER — Ambulatory Visit (INDEPENDENT_AMBULATORY_CARE_PROVIDER_SITE_OTHER): Payer: Medicare Other | Admitting: Pharmacist

## 2013-10-10 ENCOUNTER — Telehealth (HOSPITAL_COMMUNITY): Payer: Self-pay | Admitting: Cardiology

## 2013-10-10 DIAGNOSIS — Z953 Presence of xenogenic heart valve: Secondary | ICD-10-CM

## 2013-10-10 DIAGNOSIS — Z952 Presence of prosthetic heart valve: Secondary | ICD-10-CM

## 2013-10-10 DIAGNOSIS — I6350 Cerebral infarction due to unspecified occlusion or stenosis of unspecified cerebral artery: Secondary | ICD-10-CM

## 2013-10-10 DIAGNOSIS — I4891 Unspecified atrial fibrillation: Secondary | ICD-10-CM

## 2013-10-10 LAB — GLUCOSE, CAPILLARY: Glucose-Capillary: 115 mg/dL — ABNORMAL HIGH (ref 70–99)

## 2013-10-10 NOTE — Telephone Encounter (Signed)
Pt wanted to know if she can take ibuprofen or asa for arthritis as she is on coumadin

## 2013-10-10 NOTE — Telephone Encounter (Signed)
Spoke with pt and caregiver during Coumadin visit.  Suggested Tylenol products over ASA or IBU due to bleeding risks.  She is agreeable.

## 2013-10-12 ENCOUNTER — Encounter (HOSPITAL_COMMUNITY): Payer: Medicare Other

## 2013-10-14 ENCOUNTER — Encounter (HOSPITAL_COMMUNITY): Payer: Medicare Other

## 2013-10-17 ENCOUNTER — Encounter (HOSPITAL_COMMUNITY)
Admission: RE | Admit: 2013-10-17 | Discharge: 2013-10-17 | Disposition: A | Discharge status: Home / Self Care | Payer: Medicare Other | Source: Ambulatory Visit | Attending: Internal Medicine | Admitting: Internal Medicine

## 2013-10-17 ENCOUNTER — Ambulatory Visit (INDEPENDENT_AMBULATORY_CARE_PROVIDER_SITE_OTHER): Payer: Medicare Other | Admitting: Pharmacist

## 2013-10-17 ENCOUNTER — Other Ambulatory Visit (INDEPENDENT_AMBULATORY_CARE_PROVIDER_SITE_OTHER): Payer: Medicare Other

## 2013-10-17 DIAGNOSIS — I4891 Unspecified atrial fibrillation: Secondary | ICD-10-CM

## 2013-10-17 DIAGNOSIS — I509 Heart failure, unspecified: Secondary | ICD-10-CM

## 2013-10-17 DIAGNOSIS — I5032 Chronic diastolic (congestive) heart failure: Secondary | ICD-10-CM

## 2013-10-17 DIAGNOSIS — Z952 Presence of prosthetic heart valve: Secondary | ICD-10-CM

## 2013-10-17 DIAGNOSIS — Z953 Presence of xenogenic heart valve: Secondary | ICD-10-CM

## 2013-10-17 DIAGNOSIS — I6350 Cerebral infarction due to unspecified occlusion or stenosis of unspecified cerebral artery: Secondary | ICD-10-CM

## 2013-10-17 LAB — BASIC METABOLIC PANEL
CO2: 31 mEq/L (ref 19–32)
Chloride: 100 mEq/L (ref 96–112)
Creatinine, Ser: 1.6 mg/dL — ABNORMAL HIGH (ref 0.4–1.2)
GFR: 34.09 mL/min — ABNORMAL LOW (ref >60.00)
Glucose, Bld: 132 mg/dL — ABNORMAL HIGH (ref 70–99)
Sodium: 141 mEq/L (ref 135–145)

## 2013-10-17 LAB — POCT INR: INR: 1.8

## 2013-10-17 LAB — GLUCOSE, CAPILLARY: Glucose-Capillary: 111 mg/dL — ABNORMAL HIGH (ref 70–99)

## 2013-10-18 ENCOUNTER — Telehealth (HOSPITAL_COMMUNITY): Payer: Self-pay | Admitting: *Deleted

## 2013-10-18 DIAGNOSIS — I5022 Chronic systolic (congestive) heart failure: Secondary | ICD-10-CM

## 2013-10-18 MED ORDER — FUROSEMIDE 40 MG PO TABS: ORAL_TABLET | ORAL | Status: DC

## 2013-10-18 NOTE — Telephone Encounter (Signed)
Message copied by Noralee Space on Tue Oct 18, 2013  4:34 PM ------      Message from: Noralee Space      Created: Mon Oct 17, 2013  5:03 PM       Per Ulla Potash, NP hold Lasix for 2 days then decrease to 40 mg AM and 20 mg in PM ------

## 2013-10-19 ENCOUNTER — Encounter (HOSPITAL_COMMUNITY)
Admission: RE | Admit: 2013-10-19 | Discharge: 2013-10-19 | Disposition: A | Discharge status: Home / Self Care | Payer: Medicare Other | Source: Ambulatory Visit | Attending: Internal Medicine | Admitting: Internal Medicine

## 2013-10-19 ENCOUNTER — Other Ambulatory Visit: Payer: Self-pay

## 2013-10-19 ENCOUNTER — Ambulatory Visit (HOSPITAL_COMMUNITY)
Admission: RE | Admit: 2013-10-19 | Discharge: 2013-10-19 | Disposition: A | Discharge status: Home / Self Care | Payer: Medicare Other | Source: Ambulatory Visit | Attending: Internal Medicine | Admitting: Internal Medicine

## 2013-10-19 ENCOUNTER — Telehealth (HOSPITAL_COMMUNITY): Payer: Self-pay | Admitting: *Deleted

## 2013-10-19 VITALS — BP 102/58 | HR 102 | Wt 132.8 lb

## 2013-10-19 DIAGNOSIS — I5032 Chronic diastolic (congestive) heart failure: Secondary | ICD-10-CM

## 2013-10-19 DIAGNOSIS — I10 Essential (primary) hypertension: Secondary | ICD-10-CM

## 2013-10-19 DIAGNOSIS — I4891 Unspecified atrial fibrillation: Secondary | ICD-10-CM

## 2013-10-19 DIAGNOSIS — I5033 Acute on chronic diastolic (congestive) heart failure: Secondary | ICD-10-CM | POA: Insufficient documentation

## 2013-10-19 DIAGNOSIS — I6529 Occlusion and stenosis of unspecified carotid artery: Secondary | ICD-10-CM

## 2013-10-19 DIAGNOSIS — I447 Left bundle-branch block, unspecified: Secondary | ICD-10-CM

## 2013-10-19 DIAGNOSIS — I359 Nonrheumatic aortic valve disorder, unspecified: Secondary | ICD-10-CM

## 2013-10-19 DIAGNOSIS — I350 Nonrheumatic aortic (valve) stenosis: Secondary | ICD-10-CM

## 2013-10-19 DIAGNOSIS — R9431 Abnormal electrocardiogram [ECG] [EKG]: Secondary | ICD-10-CM | POA: Insufficient documentation

## 2013-10-19 DIAGNOSIS — I509 Heart failure, unspecified: Secondary | ICD-10-CM | POA: Insufficient documentation

## 2013-10-19 DIAGNOSIS — I498 Other specified cardiac arrhythmias: Secondary | ICD-10-CM | POA: Insufficient documentation

## 2013-10-19 LAB — GLUCOSE, CAPILLARY: Glucose-Capillary: 118 mg/dL — ABNORMAL HIGH (ref 70–99)

## 2013-10-19 MED ORDER — FUROSEMIDE 40 MG PO TABS: 40.0000 mg | ORAL_TABLET | Freq: Two times a day (BID) | ORAL | Status: DC

## 2013-10-19 MED ORDER — METOLAZONE 2.5 MG PO TABS: 2.5000 mg | ORAL_TABLET | ORAL | Status: DC

## 2013-10-19 MED ORDER — POTASSIUM CHLORIDE CRYS ER 20 MEQ PO TBCR
20.0000 meq | EXTENDED_RELEASE_TABLET | Freq: Once | ORAL | Status: AC
  Administered 2013-10-19: 20 meq via ORAL
  Filled 2013-10-19: qty 1

## 2013-10-19 MED ORDER — FUROSEMIDE 10 MG/ML IJ SOLN
40.0000 mg | Freq: Once | INTRAMUSCULAR | Status: AC
  Administered 2013-10-19: 40 mg via INTRAVENOUS
  Filled 2013-10-19: qty 4

## 2013-10-19 NOTE — Progress Notes (Addendum)
Patient reports having a fast heart rate and feeling short of breath at home since last night. Upon assessment lung fields with scattered crackles. Oxygen saturation 87% on room air. Patient placed on oxygen initially at 4 liters per minute. Dr Bensimhon's office called and notified 12 lead EKG obtained. 12 lead ECG EKG shows sinus tach 107. Oxygen 100% on 4L/min.  Respirations 24.  Trinidee reports not feeling as short of breath wearing the oxygen. Yalitza did not take her furosemide this morning. Bilateral lower extremity edema present. Oxygen 96%on 2 liters per minute. Effie will not exercise today.

## 2013-10-19 NOTE — Patient Instructions (Signed)
Increase Furosemide (Lasix) to 40 mg Twice daily   Start Metolazone 2.5 mg every Monday  Keep follow up as scheduled

## 2013-10-19 NOTE — Progress Notes (Signed)
Patient ID: Joanna [name], female   DOB: 1929-11-11, 79 y.o.   MRN: [id number]  PCP: Dr Debby Bud  HPI: Joanna [name] is a 79 year old woman with aortic stenosis s/p AVR/mitral valve replacement (06/15/2013), systolic HF due to NICM, hypertension, diabetes mellitus type II, hyperlipidemia, and peripheral artery disease. Underwent AVR/MVR with placement of epicardial lead on 06/15/13.    Admitted initially in 09/2011 with acute systolic HF, EF 30%. Cath with normal coronaries and normal cardiac output (see below). AS was mild to moderate. In hospital also found to be iron-deficiency anemia. Previous colonoscopies normal. Seen by GI who suggested trial of iron and see if it improved. If not, repeat endo. Stool guaiac was negative.   RA = 8 RV = 58/2/11 PA = 54/22 (37) PCW = 20 Fick cardiac output/index = 4.57/2.67 PVR = 3.7 Woods   12/18/11 ECHO EF 30-35% mod AS mean gradient 27 03/26/12 ECHO EF 35% mod AS mean gradient 27  05/09/13 ECHO EF 30%, mild LVH, moderate MR, septal-lateral dyssynchrony, suspect severe AS with AVA 0.6 and peak/mean gradient 63/33 mmHg.   05/19/2013 ECHO EF 35% severe AS (low gradient severe AS confirmed by DSE), gradient: 32mm Hg (S). Peak gradient: 57mm Hg  08/18/13 ECHO EF 45-50% AV working well  S/P AVR/MVR with placement of epicardial lead on 06/15/13. She had post-op atrial fibrillation that terminated with amiodarone.  She is off amiodarone now due to nausea. Was discharged from the hospital 06/27/13.   Acute visit: Last visit patient still had volume overload and increased lasix to 40 mg BID along with increase in potassium. Labs yesterday and Cr elevated to 1.6 from 1.10 and was told to hold lasix today and tomorrow and cut lasix back to 40 mg qam and 20 mg q pm. Her daughter reported her weight was down and she was feeling well. Went to CR this am and developed flash pulmonary edema and SOB. EKG showed ST 107 and O2 sat 86% on 2L. She reports she had orthopnea last night and was SOB  down in CR. When she became SOB in CR reports that she noticed palpitations. Denies CP. + bilateral LE edema  Labs (9/14): K 5.4, creatinine 1.69    (09/2013): K+ 4.6, creatinine 1.08, pro-BNP 11643   (10/17/13): K+ 4.4, creatinine 1.6  ROS: All systems negative except as listed in HPI, PMH and Problem List.  Past Medical History  Diagnosis Date  . Peripheral artery disease   . Hyperlipidemia     takes Simvastatin daily  . PONV (postoperative nausea and vomiting)   . Shortness of breath   . Arthritis   . Aortic stenosis 09/21/2011  . AORTIC VALVE SCLEROSIS 02/21/2010  . 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 BUNDLE BRANCH BLOCK, LEFT 07/30/2007  . CAROTID ARTERY STENOSIS 01/05/2009  . CVA 03/14/2010  . 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 PERIPHERAL VASCULAR DISEASE 07/30/2007  . POLYPECTOMY, HX OF 07/30/2007  . Constipation     takes stool softener daily  . CHF (congestive heart failure)     takes lasix daily  . Diabetes mellitus     takes metformin daily and actos   . DM 07/30/2007  . HTN (hypertension)     takes Carvedilol daily and losartan  . HYPERTENSION 07/30/2007  . Weakness     both hands  . Dizziness   . Joint pain   . Itching     on legs d/t dry skin  . Anemia     takes Iron pill daily  .  Iron deficiency anemia 09/21/2011  . Nocturia   . History of blood transfusion     no abnormal reaction noted  . History of shingles   . Mitral regurgitation   . S/P aortic valve replacement with bioprosthetic valve 06/15/2013    21 mm [facility] Ease bovine pericardial tissue valve  . S/P mitral valve replacement with bioprosthetic valve 06/15/2013    25 mm [facility] Mitral bovine pericardial tissue valve     Current Outpatient Prescriptions  Medication Sig Dispense Refill  . amLODipine (NORVASC) 5 MG tablet Take 1 tablet (5 mg total) by mouth daily.  30 tablet  3  . carvedilol (COREG) 6.25 MG tablet TAKE 1 TABLET BY MOUTH TWICE A DAY WITH MEALS  60 tablet  5  . docusate  sodium (COLACE) 100 MG capsule Take 100 mg by mouth 2 (two) times daily.        . ferrous sulfate 325 (65 FE) MG tablet Take 325 mg by mouth 2 (two) times daily with a meal.      . furosemide (LASIX) 40 MG tablet Take 1 tab in AM and 1/2 tab in PM  60 tablet  3  . lactose free nutrition (BOOST) LIQD Take 237 mLs by mouth daily.      Marland Kitchen losartan (COZAAR) 100 MG tablet Take 100 mg by mouth daily.      . metFORMIN (GLUCOPHAGE) 1000 MG tablet Take 1,000 mg by mouth 2 (two) times daily with a meal.      . Potassium Chloride ER 20 MEQ TBCR Take 20 mEq by mouth 2 (two) times daily.  60 tablet  3  . simvastatin (ZOCOR) 20 MG tablet Take 1 tablet (20 mg total) by mouth every evening.  90 tablet  3  . sitaGLIPtin (JANUVIA) 50 MG tablet Take 1 tablet (50 mg total) by mouth daily.  30 tablet  11  . warfarin (COUMADIN) 4 MG tablet Take 4 mg by mouth daily. OR AS DIRECTED       Current Facility-Administered Medications  Medication Dose Route Frequency Provider Last Rate Last Dose  . furosemide (LASIX) injection 40 mg  40 mg Intravenous Once Dolores Patty, MD      . potassium chloride SA (K-DUR,KLOR-CON) CR tablet 20 mEq  20 mEq Oral Once Dolores Patty, MD        Filed Vitals:   10/19/13 1211  BP: 102/58  Pulse: 102  Weight: 132 lb 12.8 oz (60.238 kg)  SpO2: 100%    PHYSICAL EXAM: General:  Elderly.SOB and O2 sats on arrival 86% on 2L  HEENT: normal Neck: supple. JVP 9-10. Carotids 2+ bilaterally; no bruits. No lymphadenopathy or thryomegaly appreciated. Cor: PMI normal. Regular rate & rhythm.  Paradoxical S2 split.  2/6 SEM. Sternotomy scar well-healed Lungs: crackles through out Abdomen: soft, nontender, nondistended. No hepatosplenomegaly. No bruits or masses. Good bowel sounds. Extremities: no cyanosis, clubbing, rash, R and LLE 2+ edema to knees; TED hose intact  Neuro: alert & orientedx3, cranial nerves grossly intact. Moves all 4 extremities w/o difficulty. Affect  pleasant  ASSESSMENT & PLAN:  1. Acute/Chronic diastolic CHF: EF improved to 50-55% (07/2013) post-AVR/MVR.  - NYHA class IIIb symptoms and volume overloaded. Will place IV and give 40 mg IV lasix in clinic. Appears that she went into flash pulmonary edema in cardiac rehab. She has a history of Afib, but did not tolerate Amiodarone with severe nausea. May need to try and switch to Toprol next visit for  better rate control. EKG today when arrived ST 107 bpm. - Will increase lasix back to 40 mg BID along with metolazone 2.5 mg every Monday and will follow Cr closely. Concerned that her new baseline is going to be higher in order to keep fluid off. If she continues to have issues may need repeat ECHO. She has LBBB and has epicardial leads if BiV pacing is needed in the future.   - Continue all other meds at current doses. - Reinforced the need and importance of daily weights, a low sodium diet, and fluid restriction (less than 2 L a day). Instructed to call the HF clinic if weight increases more than 3 lbs overnight or 5 lbs in a week.  2. Carotid stenosis: Carotid dopplers 2/14 with 40-59% RICA stenosis, repeat in 11/2013. Will order on next visit.  3. Hyperlipidemia: Given known vascular disease (carotid stenosis), continue statin.  4. Aortic stenosis/mitral regurg: s/p bioprosthetic AVR/MVR.  Well-seated valves on post-op echo (07/2013) 5. CKD: Increased, however is volume overloaded and SOB. Will continue to follow closely. Will recheck 2 weeks 6. Paroxysmal atrial fibrillation: Noted post-op. EKG today ST. She is off amiodarone due to nausea. Continue on coumadin. As above may need to switch to Toprol for better rate control. 7. HTN: Stable. Will continue current medications.    F/U 2 weeks Aundria Rud, NP-C 12:28 PM 10/19/2013  Addendum: received 40 mg IV lasix with 300 UOP out and felt much better. Sats increased to 96% on RA.

## 2013-10-19 NOTE — Progress Notes (Signed)
Patinet brought into clinic from outpatient cardiac rehab with reported o2 sat of 87% on RA, SOB.  22g PIV started in LPFA, 40mg  IV lasix and 20 meq PO potassium administered.  Pt urinary output 300cc short time after.  o2 sats rechecked once returned from bathroom, sats now 94% on RA, pt denies SOB, feeling better.  Son sitting with patient, will continue to monitor closely. Ave Filter

## 2013-10-19 NOTE — Telephone Encounter (Signed)
EKG with ST, o2 sats 87% pt was brought to clinic and evaluated by Ulla Potash, NP

## 2013-10-19 NOTE — Progress Notes (Signed)
Joanna Potash NP-C to evaluate the patient in the CHF clinic. Patient taken via wheelchair on 2 liters of oxygen. Patient's son present and aware.

## 2013-10-19 NOTE — Telephone Encounter (Signed)
Byrd Hesselbach, RN with cardiac rehab called concerned about pt's HR she states it is in the 110s wants to make sure its ok to get EKG advised ok, she will get and fax to [place] for review

## 2013-10-21 ENCOUNTER — Encounter (HOSPITAL_COMMUNITY)
Admission: RE | Admit: 2013-10-21 | Discharge: 2013-10-21 | Disposition: A | Discharge status: Home / Self Care | Payer: Medicare Other | Source: Ambulatory Visit | Attending: Internal Medicine | Admitting: Internal Medicine

## 2013-10-21 DIAGNOSIS — Z952 Presence of prosthetic heart valve: Secondary | ICD-10-CM | POA: Insufficient documentation

## 2013-10-21 DIAGNOSIS — Z5189 Encounter for other specified aftercare: Secondary | ICD-10-CM | POA: Insufficient documentation

## 2013-10-21 DIAGNOSIS — E119 Type 2 diabetes mellitus without complications: Secondary | ICD-10-CM | POA: Insufficient documentation

## 2013-10-21 DIAGNOSIS — I10 Essential (primary) hypertension: Secondary | ICD-10-CM | POA: Insufficient documentation

## 2013-10-21 DIAGNOSIS — I739 Peripheral vascular disease, unspecified: Secondary | ICD-10-CM | POA: Insufficient documentation

## 2013-10-21 DIAGNOSIS — I5022 Chronic systolic (congestive) heart failure: Secondary | ICD-10-CM | POA: Insufficient documentation

## 2013-10-21 LAB — GLUCOSE, CAPILLARY: Glucose-Capillary: 130 mg/dL — ABNORMAL HIGH (ref 70–99)

## 2013-10-21 NOTE — Progress Notes (Signed)
Clarann exercised this morning without difficulty this morning. Oxygen saturation 99% on room air. Will continue to monitor the patient throughout  the program.

## 2013-10-22 LAB — GLUCOSE, CAPILLARY: Glucose-Capillary: 85 mg/dL (ref 70–99)

## 2013-10-24 ENCOUNTER — Encounter (HOSPITAL_COMMUNITY)
Admission: RE | Admit: 2013-10-24 | Discharge: 2013-10-24 | Disposition: A | Discharge status: Home / Self Care | Payer: Medicare Other | Source: Ambulatory Visit | Attending: Internal Medicine | Admitting: Internal Medicine

## 2013-10-24 LAB — GLUCOSE, CAPILLARY: Glucose-Capillary: 156 mg/dL — ABNORMAL HIGH (ref 70–99)

## 2013-10-26 ENCOUNTER — Encounter (HOSPITAL_COMMUNITY)
Admission: RE | Admit: 2013-10-26 | Discharge: 2013-10-26 | Disposition: A | Discharge status: Home / Self Care | Payer: Medicare Other | Source: Ambulatory Visit | Attending: Internal Medicine | Admitting: Internal Medicine

## 2013-10-26 ENCOUNTER — Other Ambulatory Visit (HOSPITAL_COMMUNITY): Payer: Self-pay

## 2013-10-26 LAB — GLUCOSE, CAPILLARY
GLUCOSE-CAPILLARY: 81 mg/dL (ref 70–99)
GLUCOSE-CAPILLARY: 93 mg/dL (ref 70–99)

## 2013-10-26 NOTE — Progress Notes (Signed)
Pt blood glucose checked after second station.  Pt blood glucose was 81, pt asymptomatic with no complaints.  Pt had not checked her blood glucose this morning at home.  Pt drank 1/2 bottle of boost with her medications.  Pt had egg biscuit from [employer] along with an orange.  Pt given banana to eat along with lemonade.  Pt rechecked prior to leaving to go home with daughter. Reading was 93.  Pt and daughter plan to get some lunch on the way home and will recheck blood glucose when they return to the home.  Talked at great length with pt's daughter concerning food choices and monitoring blood glucose. Daughter will make sure pt is checking her blood glucose regularly and is eating meals at home.  Shared with pt choices of food options.  Verbalized understanding.

## 2013-10-27 ENCOUNTER — Other Ambulatory Visit (HOSPITAL_COMMUNITY): Payer: Self-pay

## 2013-10-27 ENCOUNTER — Other Ambulatory Visit: Payer: Self-pay

## 2013-10-27 MED ORDER — SITAGLIPTIN PHOSPHATE 50 MG PO TABS: 50.0000 mg | ORAL_TABLET | Freq: Every day | ORAL | Status: DC

## 2013-10-27 MED ORDER — FUROSEMIDE 40 MG PO TABS
40.0000 mg | ORAL_TABLET | Freq: Two times a day (BID) | ORAL | Status: DC
Start: 2013-10-27 — End: 2013-10-31

## 2013-10-28 ENCOUNTER — Encounter (HOSPITAL_COMMUNITY)
Admission: RE | Admit: 2013-10-28 | Discharge: 2013-10-28 | Disposition: A | Discharge status: Home / Self Care | Payer: Medicare Other | Source: Ambulatory Visit | Attending: Internal Medicine | Admitting: Internal Medicine

## 2013-10-28 LAB — GLUCOSE, CAPILLARY
GLUCOSE-CAPILLARY: 120 mg/dL — AB (ref 70–99)
GLUCOSE-CAPILLARY: 69 mg/dL — AB (ref 70–99)

## 2013-10-28 NOTE — Progress Notes (Signed)
Pt attended diabetes Q and A education class.  Pt pre exercise blood glucose checked 69.  Pt questioned regarding am fasting blood glucose at home.  Pt did not check hers because she could not find her meter.  Pt ate slice of bread with yogurt butter and 1/2 bottle of boost to take her medications. Talked with son who brought her to rehab today.  He stated that he gave her a couple of pieces of deer jerky.  Pt given banana and lemonade.  Pt asymptomatic with no complaints. Rechecked increase noted to 120.  Pt given a protein to eat on the way home before getting some lunch at home.  Pt not allowed to exercise.  Pt advised to find her meter so she could monitor her blood glucose over the day and wknd.  Advised pt that she will need to eat regularly and consistently. Phoned daughter who is the primary caregiver with medical issues such as medications.  Advised her of today's events and concerned of pt taking medication to lower blood glucose but not checking blood glucose on a regular bases may become a safety issue for pt who lives alone.  Daughter shares concerns and wonders if assistive living may be an option.  Will send rehab report over to Dr. Arthur Holms for review to see if pt needs to decrease any of the medications.  Last A1C 7.6.

## 2013-10-31 ENCOUNTER — Other Ambulatory Visit (INDEPENDENT_AMBULATORY_CARE_PROVIDER_SITE_OTHER): Payer: Medicare Other

## 2013-10-31 ENCOUNTER — Other Ambulatory Visit (HOSPITAL_COMMUNITY): Payer: Self-pay

## 2013-10-31 ENCOUNTER — Encounter (HOSPITAL_COMMUNITY)
Admission: RE | Admit: 2013-10-31 | Discharge: 2013-10-31 | Disposition: A | Discharge status: Home / Self Care | Payer: Medicare Other | Source: Ambulatory Visit | Attending: Internal Medicine | Admitting: Internal Medicine

## 2013-10-31 ENCOUNTER — Ambulatory Visit (INDEPENDENT_AMBULATORY_CARE_PROVIDER_SITE_OTHER): Payer: Medicare Other | Admitting: *Deleted

## 2013-10-31 DIAGNOSIS — I4891 Unspecified atrial fibrillation: Secondary | ICD-10-CM

## 2013-10-31 DIAGNOSIS — Z952 Presence of prosthetic heart valve: Secondary | ICD-10-CM

## 2013-10-31 DIAGNOSIS — I6350 Cerebral infarction due to unspecified occlusion or stenosis of unspecified cerebral artery: Secondary | ICD-10-CM

## 2013-10-31 DIAGNOSIS — Z953 Presence of xenogenic heart valve: Secondary | ICD-10-CM

## 2013-10-31 DIAGNOSIS — I5022 Chronic systolic (congestive) heart failure: Secondary | ICD-10-CM

## 2013-10-31 LAB — BASIC METABOLIC PANEL
BUN: 37 mg/dL — ABNORMAL HIGH (ref 6–23)
CALCIUM: 9.7 mg/dL (ref 8.4–10.5)
CO2: 31 mEq/L (ref 19–32)
CREATININE: 1.1 mg/dL (ref 0.4–1.2)
Chloride: 101 mEq/L (ref 96–112)
GFR: 50.11 mL/min — ABNORMAL LOW (ref >60.00)
Glucose, Bld: 163 mg/dL — ABNORMAL HIGH (ref 70–99)
Potassium: 4.5 mEq/L (ref 3.5–5.1)
Sodium: 140 mEq/L (ref 135–145)

## 2013-10-31 LAB — GLUCOSE, CAPILLARY
Glucose-Capillary: 160 mg/dL — ABNORMAL HIGH (ref 70–99)
Glucose-Capillary: 141 mg/dL — ABNORMAL HIGH (ref 70–99)

## 2013-10-31 LAB — POCT INR: INR: 1.5

## 2013-10-31 MED ORDER — FUROSEMIDE 40 MG PO TABS: 40.0000 mg | ORAL_TABLET | Freq: Two times a day (BID) | ORAL | Status: DC

## 2013-10-31 NOTE — Progress Notes (Signed)
Pt returned to rehab today accompanied by daughter.  Pt has held metformin and januvia over the weekend.  Pt was unable to check this morning because she couldn't get enough blood.  Pt brought meter in.  Talked and educated pt and daughter on how to use meter and pen.  Noted strips expired in 2011.  Pt meter read 133 and hospital meter 141. Await response from Dr. Scarlett Presto office regarding continuing diabetic medications.  Daughter shares safety concerns regarding medications and pt eating habits and schedule.  Continue to monitor.  Alanson Aly, BSN

## 2013-11-02 ENCOUNTER — Encounter (HOSPITAL_COMMUNITY): Payer: Self-pay

## 2013-11-02 ENCOUNTER — Telehealth (HOSPITAL_COMMUNITY): Payer: Self-pay | Admitting: Internal Medicine

## 2013-11-02 ENCOUNTER — Ambulatory Visit (HOSPITAL_COMMUNITY)
Admission: RE | Admit: 2013-11-02 | Discharge: 2013-11-02 | Disposition: A | Discharge status: Home / Self Care | Payer: Medicare Other | Source: Ambulatory Visit | Attending: Internal Medicine | Admitting: Internal Medicine

## 2013-11-02 ENCOUNTER — Encounter (HOSPITAL_COMMUNITY): Payer: Medicare Other

## 2013-11-02 VITALS — BP 120/60 | HR 97 | Wt 129.1 lb

## 2013-11-02 DIAGNOSIS — Z954 Presence of other heart-valve replacement: Secondary | ICD-10-CM | POA: Insufficient documentation

## 2013-11-02 DIAGNOSIS — E119 Type 2 diabetes mellitus without complications: Secondary | ICD-10-CM | POA: Insufficient documentation

## 2013-11-02 DIAGNOSIS — I5032 Chronic diastolic (congestive) heart failure: Secondary | ICD-10-CM | POA: Insufficient documentation

## 2013-11-02 DIAGNOSIS — N189 Chronic kidney disease, unspecified: Secondary | ICD-10-CM | POA: Insufficient documentation

## 2013-11-02 DIAGNOSIS — I509 Heart failure, unspecified: Secondary | ICD-10-CM | POA: Insufficient documentation

## 2013-11-02 DIAGNOSIS — E785 Hyperlipidemia, unspecified: Secondary | ICD-10-CM | POA: Insufficient documentation

## 2013-11-02 DIAGNOSIS — I739 Peripheral vascular disease, unspecified: Secondary | ICD-10-CM | POA: Insufficient documentation

## 2013-11-02 DIAGNOSIS — I129 Hypertensive chronic kidney disease with stage 1 through stage 4 chronic kidney disease, or unspecified chronic kidney disease: Secondary | ICD-10-CM | POA: Insufficient documentation

## 2013-11-02 DIAGNOSIS — I4891 Unspecified atrial fibrillation: Secondary | ICD-10-CM | POA: Insufficient documentation

## 2013-11-02 DIAGNOSIS — I6529 Occlusion and stenosis of unspecified carotid artery: Secondary | ICD-10-CM | POA: Insufficient documentation

## 2013-11-02 DIAGNOSIS — Z7901 Long term (current) use of anticoagulants: Secondary | ICD-10-CM | POA: Insufficient documentation

## 2013-11-02 MED ORDER — POTASSIUM CHLORIDE ER 20 MEQ PO TBCR: EXTENDED_RELEASE_TABLET | ORAL | Status: DC

## 2013-11-02 MED ORDER — METOLAZONE 2.5 MG PO TABS: ORAL_TABLET | ORAL | Status: DC

## 2013-11-02 NOTE — Progress Notes (Signed)
Patient ID: Joanna [name], female   DOB: 1929/12/19, 80 y.o.   MRN: [id number]  PCP: Dr Debby Bud  HPI: Joanna [name] is a 80 year old woman with aortic stenosis s/p AVR/mitral valve replacement (06/15/2013), systolic HF due to NICM, hypertension, diabetes mellitus type II, hyperlipidemia, and peripheral artery disease. Underwent AVR/MVR with placement of epicardial lead on 06/15/13.    Admitted initially in 09/2011 with acute systolic HF, EF 30%. Cath with normal coronaries and normal cardiac output (see below). AS was mild to moderate. In hospital also found to be iron-deficiency anemia. Previous colonoscopies normal. Seen by GI who suggested trial of iron and see if it improved. If not, repeat endo. Stool guaiac was negative.   RA = 8 RV = 58/2/11 PA = 54/22 (37) PCW = 20 Fick cardiac output/index = 4.57/2.67 PVR = 3.7 Woods    05/09/13 ECHO EF 30%, mild LVH, moderate MR, septal-lateral dyssynchrony, suspect severe AS with AVA 0.6 and peak/mean gradient 63/33 mmHg.   05/19/2013 ECHO EF 35% severe AS (low gradient severe AS confirmed by DSE), gradient: 32mm Hg (S). Peak gradient: 57mm Hg  08/18/13 ECHO EF 45-50% AV working well  S/P AVR/MVR with placement of epicardial lead on 06/15/13. She had post-op atrial fibrillation that terminated with amiodarone.  She is off amiodarone now due to nausea. Was discharged from the hospital 06/27/13.   Follow up: Last visit was acute, she was in CR and O2 sat 86% and ST. Gave 40 mg IV lasix in clinic with good UOP. Increased home dose to 40 mg BID with metolazone 2.5 mg on Mondays. Reports feeling pretty good. Denies SOB, orthopnea, PND or CP. + LE edema. + palpitations that come and go. Daughter reports she thinks she still has some SOB with activity. Weight 127-130 lbs. Following a low salt diet and drinking less than 2L a day.  Labs (9/14): K 5.4, creatinine 1.69    (09/2013): K+ 4.6, creatinine 1.08, pro-BNP 11643   (10/17/13): K+ 4.4, creatinine 1.6  ROS: All  systems negative except as listed in HPI, PMH and Problem List.  Past Medical History  Diagnosis Date  . Peripheral artery disease   . Hyperlipidemia     takes Simvastatin daily  . PONV (postoperative nausea and vomiting)   . Shortness of breath   . Arthritis   . Aortic stenosis 09/21/2011  . AORTIC VALVE SCLEROSIS 02/21/2010  . 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 BUNDLE BRANCH BLOCK, LEFT 07/30/2007  . CAROTID ARTERY STENOSIS 01/05/2009  . CVA 03/14/2010  . 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 PERIPHERAL VASCULAR DISEASE 07/30/2007  . POLYPECTOMY, HX OF 07/30/2007  . Constipation     takes stool softener daily  . CHF (congestive heart failure)     takes lasix daily  . Diabetes mellitus     takes metformin daily and actos   . DM 07/30/2007  . HTN (hypertension)     takes Carvedilol daily and losartan  . HYPERTENSION 07/30/2007  . Weakness     both hands  . Dizziness   . Joint pain   . Itching     on legs d/t dry skin  . Anemia     takes Iron pill daily  . Iron deficiency anemia 09/21/2011  . Nocturia   . History of blood transfusion     no abnormal reaction noted  . History of shingles   . Mitral regurgitation   . S/P aortic valve replacement with bioprosthetic valve 06/15/2013    21 mm  [facility] Ease bovine pericardial tissue valve  . S/P mitral valve replacement with bioprosthetic valve 06/15/2013    25 mm [facility] Mitral bovine pericardial tissue valve     Current Outpatient Prescriptions  Medication Sig Dispense Refill  . amLODipine (NORVASC) 5 MG tablet Take 1 tablet (5 mg total) by mouth daily.  30 tablet  3  . carvedilol (COREG) 6.25 MG tablet TAKE 1 TABLET BY MOUTH TWICE A DAY WITH MEALS  60 tablet  5  . docusate sodium (COLACE) 100 MG capsule Take 100 mg by mouth 2 (two) times daily.        . ferrous sulfate 325 (65 FE) MG tablet Take 325 mg by mouth 2 (two) times daily with a meal.      . furosemide (LASIX) 40 MG tablet Take 1 tablet (40 mg total) by mouth  2 (two) times daily.  60 tablet  3  . lactose free nutrition (BOOST) LIQD Take 237 mLs by mouth daily.      Marland Kitchen losartan (COZAAR) 100 MG tablet Take 100 mg by mouth daily.      . metolazone (ZAROXOLYN) 2.5 MG tablet Take 1 tablet (2.5 mg total) by mouth once a week. Every Monday  6 tablet  3  . Potassium Chloride ER 20 MEQ TBCR Take 20 mEq by mouth 2 (two) times daily.  60 tablet  3  . simvastatin (ZOCOR) 20 MG tablet Take 1 tablet (20 mg total) by mouth every evening.  90 tablet  3  . warfarin (COUMADIN) 4 MG tablet Take 4 mg by mouth daily. OR AS DIRECTED       No current facility-administered medications for this encounter.    Filed Vitals:   11/02/13 1539  BP: 120/60  Pulse: 97  Weight: 129 lb 1.9 oz (58.568 kg)  SpO2: 95%    PHYSICAL EXAM: General:  Elderly. NAD; daughter present HEENT: normal Neck: supple. JVP 9. Carotids 2+ bilaterally; no bruits. No lymphadenopathy or thryomegaly appreciated. Cor: PMI normal. Regular rate & rhythm.  Paradoxical S2 split.  2/6 SEM. Sternotomy scar well-healed Lungs: crackles through out Abdomen: soft, nontender, nondistended. No hepatosplenomegaly. No bruits or masses. Good bowel sounds. Extremities: no cyanosis, clubbing, rash, R and LLE 2+ edema to knees; TED hose intact  Neuro: alert & orientedx3, cranial nerves grossly intact. Moves all 4 extremities w/o difficulty. Affect pleasant  ASSESSMENT & PLAN:  1. Chronic diastolic CHF: EF improved to 50-55% (07/2013) post-AVR/MVR.  - NYHA class II symptoms and continues to be volume overloaded. Will increase metolazone to twice weekly and continue to follow Cr closely. Concerned that her new baseline is going to be higher in order to keep fluid off.   - Continue all other meds at current doses. BP stable - Reinforced the need and importance of daily weights, a low sodium diet, and fluid restriction (less than 2 L a day). Instructed to call the HF clinic if weight increases more than 3 lbs overnight  or 5 lbs in a week.  2. Carotid stenosis: Carotid dopplers 11/2012 with 40-59% RICA stenosis, repeat in 11/2013. Will order on next visit.  3. Hyperlipidemia: Given known vascular disease (carotid stenosis), continue statin.  4. Aortic stenosis/mitral regurg: s/p bioprosthetic AVR/MVR.  Well-seated valves on post-op echo (07/2013) 5. CKD: Last BMET stable and back to baseline. Will continue to follow closely with increase in diuretics. 6. Paroxysmal atrial fibrillation: Noted post-op. Appears to be in NSR today. She is off amiodarone due to nausea.  Continue on coumadin. With continued reports of palpations will order 30 day monitor 7. HTN: Stable. Will continue current medications.    Can return to work with rest breaks as needed.   F/U 4-6 weeks with BMET Cosgrove, AAundria Rudli B, NP-C 3:54 PM 11/02/2013  Patient seen and examined with Ulla PotashAli Cosgrove, NP. We discussed all aspects of the encounter. I agree with the assessment and plan as stated above. Continues to improve. Volume status still elevated.  Agree with increasing metolazone. Watch renal function closely. She is off amio due to severe intolerance. Given palpitations will place monitor.   Daniel Bensimhon,MD 4:30 PM

## 2013-11-02 NOTE — Patient Instructions (Addendum)
Increase your metolazone to 2.5 mg every Monday and Thursday. Take an extra potassium on the days you take metolazone.  Place Holter Monitor.  Call any issues.  Can go back to work.  F/U 4-6 weeks.   Do the following things EVERYDAY: 1) Weigh yourself in the morning before breakfast. Write it down and keep it in a log. 2) Take your medicines as prescribed 3) Eat low salt foods-Limit salt (sodium) to 2000 mg per day.  4) Stay as active as you can everyday 5) Limit all fluids for the day to less than 2 liters

## 2013-11-04 ENCOUNTER — Encounter (HOSPITAL_COMMUNITY)
Admission: RE | Admit: 2013-11-04 | Discharge: 2013-11-04 | Disposition: A | Discharge status: Home / Self Care | Payer: Medicare Other | Source: Ambulatory Visit | Attending: Internal Medicine | Admitting: Internal Medicine

## 2013-11-04 LAB — GLUCOSE, CAPILLARY
GLUCOSE-CAPILLARY: 196 mg/dL — AB (ref 70–99)
Glucose-Capillary: 218 mg/dL — ABNORMAL HIGH (ref 70–99)

## 2013-11-07 ENCOUNTER — Encounter (HOSPITAL_COMMUNITY): Admission: RE | Admit: 2013-11-07 | Payer: Medicare Other | Source: Ambulatory Visit

## 2013-11-07 ENCOUNTER — Encounter: Payer: Self-pay | Admitting: *Deleted

## 2013-11-07 ENCOUNTER — Ambulatory Visit (INDEPENDENT_AMBULATORY_CARE_PROVIDER_SITE_OTHER): Payer: Medicare Other | Admitting: *Deleted

## 2013-11-07 DIAGNOSIS — I4891 Unspecified atrial fibrillation: Secondary | ICD-10-CM

## 2013-11-07 NOTE — Progress Notes (Signed)
Patient ID: Joanna [name], female   DOB: Jun 30, 1930, 80 y.o.   MRN: [id number] E-[employer]io Bra[employer] 30 day cardiac event monitor applied to patient.

## 2013-11-07 NOTE — Progress Notes (Signed)
Patient ID: Joanna [name], female   DOB: 11/16/1929, 80 y.o.   MRN: [id number] E[employer] 30 day cardiac event monitor applied to patient. 

## 2013-11-08 ENCOUNTER — Telehealth (HOSPITAL_COMMUNITY): Payer: Self-pay | Admitting: Cardiology

## 2013-11-08 ENCOUNTER — Encounter (HOSPITAL_COMMUNITY): Payer: Self-pay | Admitting: Cardiology

## 2013-11-08 NOTE — Telephone Encounter (Signed)
Normal letter mailed

## 2013-11-08 NOTE — Telephone Encounter (Signed)
Message copied by Teresia Myint, Milagros Reap on Tue Nov 08, 2013  8:45 AM ------      Message from: Dolores Patty      Created: Fri Nov 04, 2013  4:13 PM       ok ------

## 2013-11-09 ENCOUNTER — Encounter (HOSPITAL_COMMUNITY)
Admission: RE | Admit: 2013-11-09 | Discharge: 2013-11-09 | Disposition: A | Discharge status: Home / Self Care | Payer: Medicare Other | Source: Ambulatory Visit | Attending: Internal Medicine | Admitting: Internal Medicine

## 2013-11-09 LAB — GLUCOSE, CAPILLARY: GLUCOSE-CAPILLARY: 190 mg/dL — AB (ref 70–99)

## 2013-11-11 ENCOUNTER — Encounter (HOSPITAL_COMMUNITY)
Admission: RE | Admit: 2013-11-11 | Discharge: 2013-11-11 | Disposition: A | Discharge status: Home / Self Care | Payer: Medicare Other | Source: Ambulatory Visit | Attending: Internal Medicine | Admitting: Internal Medicine

## 2013-11-11 ENCOUNTER — Ambulatory Visit (INDEPENDENT_AMBULATORY_CARE_PROVIDER_SITE_OTHER): Payer: Medicare Other

## 2013-11-11 DIAGNOSIS — Z5181 Encounter for therapeutic drug level monitoring: Secondary | ICD-10-CM | POA: Insufficient documentation

## 2013-11-11 DIAGNOSIS — I4891 Unspecified atrial fibrillation: Secondary | ICD-10-CM

## 2013-11-11 DIAGNOSIS — I6350 Cerebral infarction due to unspecified occlusion or stenosis of unspecified cerebral artery: Secondary | ICD-10-CM

## 2013-11-11 DIAGNOSIS — Z953 Presence of xenogenic heart valve: Secondary | ICD-10-CM

## 2013-11-11 DIAGNOSIS — Z952 Presence of prosthetic heart valve: Secondary | ICD-10-CM

## 2013-11-11 LAB — GLUCOSE, CAPILLARY
GLUCOSE-CAPILLARY: 242 mg/dL — AB (ref 70–99)
Glucose-Capillary: 220 mg/dL — ABNORMAL HIGH (ref 70–99)

## 2013-11-11 LAB — POCT INR: INR: 1.9

## 2013-11-14 ENCOUNTER — Ambulatory Visit (INDEPENDENT_AMBULATORY_CARE_PROVIDER_SITE_OTHER): Payer: Medicare Other | Admitting: Thoracic Surgery (Cardiothoracic Vascular Surgery)

## 2013-11-14 ENCOUNTER — Encounter (HOSPITAL_COMMUNITY): Payer: Medicare Other

## 2013-11-14 ENCOUNTER — Telehealth (HOSPITAL_COMMUNITY): Payer: Self-pay | Admitting: Internal Medicine

## 2013-11-14 ENCOUNTER — Encounter: Payer: Self-pay | Admitting: Thoracic Surgery (Cardiothoracic Vascular Surgery)

## 2013-11-14 VITALS — BP 113/54 | HR 92 | Resp 20 | Wt 125.0 lb

## 2013-11-14 DIAGNOSIS — Z953 Presence of xenogenic heart valve: Secondary | ICD-10-CM

## 2013-11-14 DIAGNOSIS — I059 Rheumatic mitral valve disease, unspecified: Secondary | ICD-10-CM

## 2013-11-14 DIAGNOSIS — I5032 Chronic diastolic (congestive) heart failure: Secondary | ICD-10-CM

## 2013-11-14 DIAGNOSIS — Z952 Presence of prosthetic heart valve: Secondary | ICD-10-CM

## 2013-11-14 DIAGNOSIS — I340 Nonrheumatic mitral (valve) insufficiency: Secondary | ICD-10-CM

## 2013-11-14 DIAGNOSIS — I350 Nonrheumatic aortic (valve) stenosis: Secondary | ICD-10-CM

## 2013-11-14 DIAGNOSIS — Z954 Presence of other heart-valve replacement: Secondary | ICD-10-CM

## 2013-11-14 DIAGNOSIS — I359 Nonrheumatic aortic valve disorder, unspecified: Secondary | ICD-10-CM

## 2013-11-14 DIAGNOSIS — I509 Heart failure, unspecified: Secondary | ICD-10-CM

## 2013-11-14 NOTE — Patient Instructions (Addendum)
  Endocarditis is a potentially serious infection of heart valves or inside lining of the heart.  It occurs more commonly in patients with diseased heart valves (such as patient's with aortic or mitral valve disease) and in patients who have undergone heart valve repair or replacement.  Certain surgical and dental procedures may put you at risk, such as dental cleaning, other dental procedures, or any surgery involving the respiratory, urinary, gastrointestinal tract, gallbladder or prostate gland.   To minimize your chances for develooping endocarditis, maintain good oral health and seek prompt medical attention for any infections involving the mouth, teeth, gums, skin or urinary tract.  Always notify your doctor or dentist about your underlying heart valve condition before having any invasive procedures. You will need to take antibiotics before certain procedures.   Discuss with Dr Gala Romney duration of coumadin (warfarin) therapy

## 2013-11-14 NOTE — Progress Notes (Signed)
[address]       Joanna [name] [address]             [phone]     CARDIOTHORACIC SURGERY OFFICE NOTE  Referring Provider is Joanna Morale, MD PCP is Joanna Regulus, MD   HPI:  Patient returns for followup status post aortic and mitral valve replacement using a bioprosthetic tissue valves on 06/15/2013. She also had left ventricular epicardial pacemaker leads placed at the time of surgery because of the presence of severe left ventricular dysfunction preoperatively anticipating the possibility that the patient might benefit from biventricular pacing in the future. Postoperatively, the patient developed recurrent paroxysmal atrial fibrillation for which she was treated with amiodarone. Prior to hospital discharge, the patient was maintaining sinus rhythm. She was initially discharged to [facility] skilled nursing facility, but has since returned home.  Amiodarone was discontinued because of nausea. The patient was seen most recently here in our office on 09/19/2013. She has been followed carefully by Dr. Gala [name] and colleagues in the heart failure clinic. She returns for routine followup today.  The patient reports that she is doing exceptionally well. She continues to participate in the cardiac rehabilitation program and has been making steady progress. She denies any significant problems with exertional shortness of breath. Her daughter notes that she does still get short of breath with activity, but overall her exercise tolerance has improved nicely. On one occasion she was sent to the heart failure clinic from cardiac rehabilitation because of shortness of breath and treated with intravenous diuretics. Otherwise she has done remarkably well. She is home and living alone and back to functional independence. She wants to go back to work. She is monitoring her weight carefully and currently wearing a continuous cooximetry monitor.  She reports that her breathing and neck  incised tolerance is dramatically better than it was prior to surgery. She has no pain. Her appetite is good. She denies any tachypalpitations or dizzy spells    Current Outpatient Prescriptions  Medication Sig Dispense Refill  . amLODipine (NORVASC) 5 MG tablet Take 1 tablet (5 mg total) by mouth daily.  30 tablet  3  . carvedilol (COREG) 6.25 MG tablet TAKE 1 TABLET BY MOUTH TWICE A DAY WITH MEALS  60 tablet  5  . docusate sodium (COLACE) 100 MG capsule Take 100 mg by mouth 2 (two) times daily.        . ferrous sulfate 325 (65 FE) MG tablet Take 325 mg by mouth 2 (two) times daily with a meal.      . furosemide (LASIX) 40 MG tablet Take 1 tablet (40 mg total) by mouth 2 (two) times daily.  60 tablet  3  . lactose free nutrition (BOOST) LIQD Take 237 mLs by mouth daily.      Marland Kitchen losartan (COZAAR) 100 MG tablet Take 100 mg by mouth daily.      . metFORMIN (GLUCOPHAGE) 1000 MG tablet Take 1,000 mg by mouth 2 (two) times daily with a meal.      . metolazone (ZAROXOLYN) 2.5 MG tablet Take 2.5 mg (1 tablet) every Monday and 2.5 mg (1 tablet) every Thursday or as instructed by provider  10 tablet  3  . Potassium Chloride ER 20 MEQ TBCR Take 20 meq (1 tablet) twice a day. On Mondays and Thursdays take an extra potassium tablet.  68 tablet  3  . simvastatin (ZOCOR) 20 MG tablet Take 1 tablet (20 mg total) by  mouth every evening.  90 tablet  3  . warfarin (COUMADIN) 4 MG tablet Take 4 mg by mouth daily. OR AS DIRECTED       No current facility-administered medications for this visit.      Physical Exam:   BP 113/54  Pulse 92  Resp 20  Wt 125 lb (56.7 kg)  SpO2 97%  General:  Well-appearing  Chest:   Clear to auscultation  CV:   Regular rate and rhythm without murmur  Incisions:  Completely healed, sternum is stable  Abdomen:  Soft and nontender  Extremities:  Warm and well-perfused with moderate bilateral lower extremity edema, reportedly decreased  Diagnostic Tests:  Transthoracic  Echocardiography  Patient:    Joanna, [name] MR #:       [id number] Study Date: 08/18/2013 Gender:     F Age:        80 Height:     162.6cm Weight:     60kg BSA:        1.68m^2 Pt. Status: Room:    PERFORMING   Joanna [name], [facility]  REFERRING    Joanna [name]  ATTENDING    Joanna [name]  Joanna [name]  REFERRING    Joanna [name]  SONOGRAPHER  Joanna [name], RDCS cc:  ------------------------------------------------------------ LV EF: 50% -   55%  ------------------------------------------------------------ Indications:      CHF - 428.0.  ------------------------------------------------------------ History:   Risk factors:  Hypertension. Diabetes mellitus.   ------------------------------------------------------------ Study Conclusions  - Left ventricle: The cavity size was normal. Wall thickness   was increased in a pattern of moderate LVH. There was   septal-lateral dyssynchrony. There is a loose mitral chord   fluttering in the LV cavity. Systolic function was low   normal to mildly decreased. The estimated ejection   fraction was in the range of 50% to 55%. Indeterminant   diastolic function. - Aortic valve: Bioprosthetic aortic valve. Trivial   perivalvular AI. No significant bioprosthetic aortic valve   stenosis. Mean gradient: 12mm Hg (S). - Mitral valve: Bioprosthetic mitral valve. No significant   bioprosthetic valve stenosis. Mean gradient mildly   elevated but pressure half-time is normal. No significant   regurgitation. Mean gradient: 8mm Hg (D). Valve area by   pressure half-time: 2.28cm^2. - Left atrium: The atrium was moderately dilated. - Right ventricle: The cavity size was normal. Systolic   function was normal. - Tricuspid valve: Peak RV-RA gradient: 28mm Hg (S). - Pulmonary arteries: PA peak pressure: 33mm Hg (S). - Inferior vena cava: The vessel was normal in size; the   respirophasic diameter changes were in the normal  range (=   50%); findings are consistent with normal central venous   pressure. Impressions:  - Normal LV size with moderate LV hypertrophy. EF 50-55%   with septal-lateral dyssynchrony. Normal RV size and   systolic function. Bioprosthetic aortic and mitral valves   appear to function normally.  ------------------------------------------------------------ Labs, prior tests, procedures, and surgery: (June 15, 2013).     Mitral valve repair.  Aortic valve replacement. Transthoracic echocardiography.  M-mode, complete 2D, spectral Doppler, and color Doppler.  Height:  Height: 162.6cm. Height: 64in.  Weight:  Weight: 60kg. Weight: 132lb.  Body mass index:  BMI: 22.7kg/m^2.  Body surface area:    BSA: 1.68m^2.  Blood pressure:     129/53.  Patient status:  Outpatient.  Location:  Echo laboratory.  ------------------------------------------------------------  ------------------------------------------------------------ Left ventricle:  The cavity size was normal. Wall  thickness was increased in a pattern of moderate LVH. There was septal-lateral dyssynchrony. There is a loose mitral chord fluttering in the LV cavity. Systolic function was low normal to mildly decreased. The estimated ejection fraction was in the range of 50% to 55%. Indeterminant diastolic function.  ------------------------------------------------------------ Aortic valve:  Bioprosthetic aortic valve. Trivial perivalvular AI. No significant bioprosthetic aortic valve stenosis.  Doppler:     Peak velocity ratio of LVOT to aortic valve: 0.57.    Mean gradient: 12mm Hg (S). Peak gradient: 21mm Hg (S).  ------------------------------------------------------------ Aorta:  Aortic root: The aortic root was normal in size. Ascending aorta: The ascending aorta was normal in size.  ------------------------------------------------------------ Mitral valve:  Bioprosthetic mitral valve. No significant bioprosthetic  valve stenosis. Mean gradient mildly elevated but pressure half-time is normal.  Doppler:   No significant regurgitation.    Valve area by pressure half-time: 2.28cm^2. Indexed valve area by pressure half-time: 1.38cm^2/m^2.    Mean gradient: 8mm Hg (D). Peak gradient: 9mm Hg (D).  ------------------------------------------------------------ Left atrium:  The atrium was moderately dilated.  ------------------------------------------------------------ Right ventricle:  The cavity size was normal. Systolic function was normal.  ------------------------------------------------------------ Pulmonic valve:    Structurally normal valve.   Cusp separation was normal.  Doppler:  Transvalvular velocity was within the normal range.  Trivial regurgitation.  ------------------------------------------------------------ Tricuspid valve:   Doppler:   Trivial regurgitation.  ------------------------------------------------------------ Right atrium:  The atrium was normal in size.  ------------------------------------------------------------ Pericardium:  There was no pericardial effusion.  ------------------------------------------------------------ Systemic veins: Inferior vena cava: The vessel was normal in size; the respirophasic diameter changes were in the normal range (= 50%); findings are consistent with normal central venous pressure.  ------------------------------------------------------------  2D measurements        Normal  Doppler measurements    Norma Left ventricle                                         l LVID ED,   44.3 mm     43-52   Main pulmonary artery chord,                         Pressure,     33 mm Hg  =30 PLAX                           S LVID ES,   31.7 mm     23-38   LVOT chord,                         Peak vel,    131 cm/s   ----- PLAX                           S FS, chord,   28 %      >29     Peak           7 mm Hg  ----- PLAX                            gradient, LVPW, ED   13.3 mm     ------  S IVS/LVPW   1.31        <1.3    Aortic valve ratio,  ED                      Peak vel,    228 cm/s   ----- Ventricular septum             S IVS, ED    17.4 mm     ------  Mean vel,    159 cm/s   ----- Aorta                          S Root diam,   22 mm     ------  VTI, S      44.9 cm     ----- ED                             Mean          12 mm Hg  ----- Left atrium                    gradient, AP dim       44 mm     ------  S AP dim     2.67 cm/m^2 <2.2    Peak          21 mm Hg  ----- index                          gradient,                                S                                Peak vel    0.57        -----                                ratio,                                LVOT/AV                                Mitral valve                                Peak E    152.03 cm/s   -----                                vel                                Peak A       178 cm/s   -----                                vel  Mean vel,    131 cm/s   -----                                D                                Decelerat 456.89 cm/s^2 -----                                ion slope                                Decelerat    333 ms     150-2                                ion time                30                                Pressure      96 ms     -----                                half-time                                Mean           8 mm Hg  -----                                gradient,                                D                                Peak           9 mm Hg  -----                                gradient,                                D                                Peak E/A    0.85        -----                                ratio  Area        2.28 cm^2   -----                                (PHT)                                Area         1.38 cm^2/m -----                                index            ^2                                (PHT)                                Annulus     56.1 cm     -----                                VTI                                Tricuspid valve                                Regurg       266 cm/s   -----                                peak vel                                Peak          28 mm Hg  -----                                RV-RA                                gradient,                                S                                Max          266 cm/s   -----                                regurg                                vel   ------------------------------------------------------------  Prepared and Electronically Authenticated by  [name] 2014-10-30T13:33:[id number]    Impression:  Patient is doing quite well nearly 6 months following aortic and mitral valve replacement using a bioprosthetic tissue valves in the setting of the 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 Followup echocardiogram performed last October looked good and clinically the patient is doing remarkably well.  Plan:  The patient will continue to followup with Dr. Gala [name] and colleagues in the heart failure clinic. It might be reasonable to consider stopping Coumadin now that she is nearly 6 months out from surgery and maintaining sinus rhythm. We will defer this decision to Dr. Gala [name] and colleagues. The patient and her daughter have been reminded regarding the need for long-term antibiotic prophylaxis for endocarditis with all dental procedures. The patient will return next August for routine followup to see how she is doing.   Salvatore Decent. Cornelius Moras, MD 11/14/2013 11:36 AM

## 2013-11-16 ENCOUNTER — Other Ambulatory Visit: Payer: Self-pay | Admitting: Cardiology

## 2013-11-16 ENCOUNTER — Encounter (HOSPITAL_COMMUNITY)
Admission: RE | Admit: 2013-11-16 | Discharge: 2013-11-16 | Disposition: A | Discharge status: Home / Self Care | Payer: Medicare Other | Source: Ambulatory Visit | Attending: Internal Medicine | Admitting: Internal Medicine

## 2013-11-16 ENCOUNTER — Encounter: Payer: Self-pay | Admitting: Internal Medicine

## 2013-11-16 LAB — GLUCOSE, CAPILLARY: Glucose-Capillary: 144 mg/dL — ABNORMAL HIGH (ref 70–99)

## 2013-11-18 ENCOUNTER — Encounter (HOSPITAL_COMMUNITY)
Admission: RE | Admit: 2013-11-18 | Discharge: 2013-11-18 | Disposition: A | Discharge status: Home / Self Care | Payer: Medicare Other | Source: Ambulatory Visit | Attending: Internal Medicine | Admitting: Internal Medicine

## 2013-11-18 LAB — GLUCOSE, CAPILLARY: Glucose-Capillary: 120 mg/dL — ABNORMAL HIGH (ref 70–99)

## 2013-11-21 ENCOUNTER — Encounter (HOSPITAL_COMMUNITY)
Admission: RE | Admit: 2013-11-21 | Discharge: 2013-11-21 | Disposition: A | Discharge status: Home / Self Care | Payer: Medicare Other | Source: Ambulatory Visit | Attending: Internal Medicine | Admitting: Internal Medicine

## 2013-11-21 DIAGNOSIS — Z5189 Encounter for other specified aftercare: Secondary | ICD-10-CM | POA: Insufficient documentation

## 2013-11-21 DIAGNOSIS — Z952 Presence of prosthetic heart valve: Secondary | ICD-10-CM | POA: Insufficient documentation

## 2013-11-21 DIAGNOSIS — E119 Type 2 diabetes mellitus without complications: Secondary | ICD-10-CM | POA: Insufficient documentation

## 2013-11-21 DIAGNOSIS — I739 Peripheral vascular disease, unspecified: Secondary | ICD-10-CM | POA: Insufficient documentation

## 2013-11-21 DIAGNOSIS — I10 Essential (primary) hypertension: Secondary | ICD-10-CM | POA: Insufficient documentation

## 2013-11-21 DIAGNOSIS — I5022 Chronic systolic (congestive) heart failure: Secondary | ICD-10-CM | POA: Insufficient documentation

## 2013-11-21 LAB — GLUCOSE, CAPILLARY: Glucose-Capillary: 154 mg/dL — ABNORMAL HIGH (ref 70–99)

## 2013-11-23 ENCOUNTER — Encounter (HOSPITAL_COMMUNITY)
Admission: RE | Admit: 2013-11-23 | Discharge: 2013-11-23 | Disposition: A | Discharge status: Home / Self Care | Payer: Medicare Other | Source: Ambulatory Visit | Attending: Internal Medicine | Admitting: Internal Medicine

## 2013-11-23 ENCOUNTER — Other Ambulatory Visit: Payer: Self-pay | Admitting: Cardiology

## 2013-11-23 LAB — GLUCOSE, CAPILLARY: Glucose-Capillary: 196 mg/dL — ABNORMAL HIGH (ref 70–99)

## 2013-11-23 MED ORDER — WARFARIN SODIUM 4 MG PO TABS: ORAL_TABLET | ORAL | Status: DC

## 2013-11-23 NOTE — Telephone Encounter (Signed)
Refill done as requested 

## 2013-11-25 ENCOUNTER — Ambulatory Visit (INDEPENDENT_AMBULATORY_CARE_PROVIDER_SITE_OTHER): Payer: Medicare Other

## 2013-11-25 ENCOUNTER — Encounter (HOSPITAL_COMMUNITY)
Admission: RE | Admit: 2013-11-25 | Discharge: 2013-11-25 | Disposition: A | Discharge status: Home / Self Care | Payer: Medicare Other | Source: Ambulatory Visit | Attending: Internal Medicine | Admitting: Internal Medicine

## 2013-11-25 DIAGNOSIS — Z953 Presence of xenogenic heart valve: Secondary | ICD-10-CM

## 2013-11-25 DIAGNOSIS — Z5181 Encounter for therapeutic drug level monitoring: Secondary | ICD-10-CM

## 2013-11-25 DIAGNOSIS — Z952 Presence of prosthetic heart valve: Secondary | ICD-10-CM

## 2013-11-25 DIAGNOSIS — I6350 Cerebral infarction due to unspecified occlusion or stenosis of unspecified cerebral artery: Secondary | ICD-10-CM

## 2013-11-25 DIAGNOSIS — I4891 Unspecified atrial fibrillation: Secondary | ICD-10-CM

## 2013-11-25 LAB — POCT INR: INR: 1.5

## 2013-11-25 MED ORDER — WARFARIN SODIUM 4 MG PO TABS: ORAL_TABLET | ORAL | Status: DC

## 2013-11-28 ENCOUNTER — Encounter (HOSPITAL_COMMUNITY)
Admission: RE | Admit: 2013-11-28 | Discharge: 2013-11-28 | Disposition: A | Discharge status: Home / Self Care | Payer: Medicare Other | Source: Ambulatory Visit | Attending: Internal Medicine | Admitting: Internal Medicine

## 2013-11-28 ENCOUNTER — Encounter: Payer: Self-pay | Admitting: Internal Medicine

## 2013-11-28 ENCOUNTER — Ambulatory Visit (INDEPENDENT_AMBULATORY_CARE_PROVIDER_SITE_OTHER): Payer: Medicare Other | Admitting: Internal Medicine

## 2013-11-28 ENCOUNTER — Ambulatory Visit (INDEPENDENT_AMBULATORY_CARE_PROVIDER_SITE_OTHER): Payer: Medicare Other

## 2013-11-28 VITALS — BP 130/68 | HR 85 | Temp 98.4°F | Wt 122.6 lb

## 2013-11-28 DIAGNOSIS — E538 Deficiency of other specified B group vitamins: Secondary | ICD-10-CM

## 2013-11-28 DIAGNOSIS — E119 Type 2 diabetes mellitus without complications: Secondary | ICD-10-CM

## 2013-11-28 DIAGNOSIS — D509 Iron deficiency anemia, unspecified: Secondary | ICD-10-CM

## 2013-11-28 DIAGNOSIS — E785 Hyperlipidemia, unspecified: Secondary | ICD-10-CM

## 2013-11-28 DIAGNOSIS — I10 Essential (primary) hypertension: Secondary | ICD-10-CM

## 2013-11-28 DIAGNOSIS — Z23 Encounter for immunization: Secondary | ICD-10-CM

## 2013-11-28 DIAGNOSIS — I5022 Chronic systolic (congestive) heart failure: Secondary | ICD-10-CM

## 2013-11-28 DIAGNOSIS — Z0000 Encounter for general adult medical examination without abnormal findings: Secondary | ICD-10-CM

## 2013-11-28 LAB — GLUCOSE, CAPILLARY: Glucose-Capillary: 141 mg/dL — ABNORMAL HIGH (ref 70–99)

## 2013-11-28 NOTE — Progress Notes (Signed)
Subjective:    Patient ID: Joanna [name], female    DOB: 1930-01-03, 80 y.o.   MRN: [id number]  HPI The patient is here for annual Medicare wellness examination and management of other chronic and acute problems.  She is s/p surgery in August- Underwent AVR/MVR with placement of epicardial lead on 06/15/13. Was discharged from the hospital 06/27/13. Then  at [facility] for several weeks before returning home. She has followed by [facility] and cardiology. She is stable continuing to attend cardiac rehab.   She has had some problems with blood sugar levels. She was advised to stop medications which she did and she than did have a blood sugar in the 500 hundred.    The risk factors are reflected in the social history.  The roster of all physicians providing medical care to patient - is listed in the Snapshot section of the chart.  Activities of daily living:  The patient is 100% inedpendent in all ADLs: dressing, toileting, feeding as well as independent mobility  Home safety : The patient has smoke detectors in the home. They wear seatbelts. No firearms at home. There is no violence in the home.   There is no risks for hepatitis, STDs or HIV. There is no history of blood transfusion. They have no travel history to infectious disease endemic areas of the world.  The patient has not seen their dentist in the last six month. They have not seen their eye doctor in the last year. They deny any hearing difficulty and have not had audiologic testing in the last year.    They do not  have excessive sun exposure. Discussed the need for sun protection: hats, long sleeves and use of sunscreen if there is significant sun exposure.   Diet: the importance of a healthy diet is discussed. They do have a healthy  diet.  The patient has a regular exercise program: cardiac rehab , 45 min duration,  3 per week.  The benefits of regular aerobic exercise were discussed.  Depression screen: there are no signs or  vegative symptoms of depression- irritability, change in appetite, anhedonia, sadness/tearfullness.  Cognitive assessment: the patient manages all their financial and personal affairs and is actively engaged.  The following portions of the patient's history were reviewed and updated as appropriate: allergies, current medications, past family history, past medical history,  past surgical history, past social history  and problem list.  Vision, hearing, body mass index were assessed and reviewed.   During the course of the visit the patient was educated and counseled about appropriate screening and preventive services including : fall prevention , diabetes screening, nutrition counseling, colorectal cancer screening, and recommended immunizations. Joanna [name] presents for follow-up: in the interval she has seen Dr. Cornelius [name] and she is doing well.   Past Medical History  Diagnosis Date  . Peripheral artery disease   . Hyperlipidemia     takes Simvastatin daily  . PONV (postoperative nausea and vomiting)   . Shortness of breath   . Arthritis   . Aortic stenosis 09/21/2011  . AORTIC VALVE SCLEROSIS 02/21/2010  . 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 BUNDLE BRANCH BLOCK, LEFT 07/30/2007  . CAROTID ARTERY STENOSIS 01/05/2009  . CVA 03/14/2010  . 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 PERIPHERAL VASCULAR DISEASE 07/30/2007  . POLYPECTOMY, HX OF 07/30/2007  . Constipation     takes stool softener daily  . CHF (congestive heart failure)     takes lasix daily  . Diabetes mellitus  takes metformin daily and actos   . DM 07/30/2007  . HTN (hypertension)     takes Carvedilol daily and losartan  . HYPERTENSION 07/30/2007  . Weakness     both hands  . Dizziness   . Joint pain   . Itching     on legs d/t dry skin  . Anemia     takes Iron pill daily  . Iron deficiency anemia 09/21/2011  . Nocturia   . History of blood transfusion     no abnormal reaction noted  . History of shingles   . [name]  regurgitation   . S/P aortic valve replacement with bioprosthetic valve 06/15/2013    21 mm Joanna [name] bovine pericardial tissue valve  . S/P [name] valve replacement with bioprosthetic valve 06/15/2013    25 mm Joanna [name] bovine pericardial tissue valve   Past Surgical History  Procedure Laterality Date  . Carotid endarterectomy Left 2011  . Polypectomy  12/27/04  . Cataract extraction Bilateral 2009  . Tee without cardioversion N/A 05/19/2013    Procedure: TRANSESOPHAGEAL ECHOCARDIOGRAM (TEE);  Surgeon: Joanna Moralealton S McLean, MD;  [facility];  Service: Cardiovascular;  Laterality: N/A;  . Colonoscopy    . Cardiac catheterization  2012  . Aortic valve replacement N/A 06/15/2013    Procedure: AORTIC VALVE REPLACEMENT (AVR);  Surgeon: Joanna Nailslarence H Owen, MD;  [facility];  Service: Open Heart Surgery;  Laterality: N/A;  . Intraoperative transesophageal echocardiogram N/A 06/15/2013    Procedure: INTRAOPERATIVE TRANSESOPHAGEAL ECHOCARDIOGRAM;  Surgeon: Joanna Nailslarence H Owen, MD;  [facility];  Service: Open Heart Surgery;  Laterality: N/A;  . [name] valve replacement N/A 06/15/2013    Procedure: [name] VALVE (MV) REPLACEMENT;  Surgeon: Joanna Nailslarence H Owen, MD;  Location: [facility] OR;  Service: Open Heart Surgery;  Laterality: N/A;  . Epicardial pacing lead placement N/A 06/15/2013    Procedure: EPICARDIAL PACING LEAD PLACEMENT;  Surgeon: Joanna Nailslarence H Owen, MD;  Location: [facility] OR;  Service: Open Heart Surgery;  Laterality: N/A;   Family History  Problem Relation Age of Onset  . Colon cancer Mother   . Diabetes Neg Hx   . Coronary artery disease Neg Hx    History   Social History  . Marital Status: Divorced    Spouse Name: N/A    Number of Children: N/A  . Years of Education: N/A   Occupational History  . Not on file.   Social History Main Topics  . Smoking status: Never Smoker   . Smokeless tobacco: Never Used  . Alcohol Use: No  . Drug Use: No  . Sexual Activity: No    Other Topics Concern  . Not on file   Social History Narrative   Patient lives alone here in town   She continues to work, helping to bind and oversew books   She is divorced after 23 years of marriage   1 son- '61, minister; 1 dtr '55; 4 grandchildren   End of life care-provided packet on living well    Current Outpatient Prescriptions on File Prior to Visit  Medication Sig Dispense Refill  . amLODipine (NORVASC) 5 MG tablet Take 1 tablet (5 mg total) by mouth daily.  30 tablet  3  . carvedilol (COREG) 6.25 MG tablet TAKE 1 TABLET BY MOUTH TWICE A DAY WITH MEALS  60 tablet  5  . docusate sodium (COLACE) 100 MG capsule Take 100 mg by mouth 2 (two) times daily.        .Marland Kitchen  ferrous sulfate 325 (65 FE) MG tablet Take 325 mg by mouth 2 (two) times daily with a meal.      . furosemide (LASIX) 40 MG tablet Take 1 tablet (40 mg total) by mouth 2 (two) times daily.  60 tablet  3  . lactose free nutrition (BOOST) LIQD Take 237 mLs by mouth daily.      Marland Kitchen losartan (COZAAR) 100 MG tablet Take 100 mg by mouth daily.      . metFORMIN (GLUCOPHAGE) 1000 MG tablet Take 1,000 mg by mouth 2 (two) times daily with a meal.      . metolazone (ZAROXOLYN) 2.5 MG tablet Take 2.5 mg (1 tablet) every Monday and 2.5 mg (1 tablet) every Thursday or as instructed by provider  10 tablet  3  . Potassium Chloride ER 20 MEQ TBCR Take 20 meq (1 tablet) twice a day. On Mondays and Thursdays take an extra potassium tablet.  68 tablet  3  . potassium chloride SA (KLOR-CON M20) 20 MEQ tablet Take 1 tablet (20 mEq total) by mouth daily. Take extra tab on Mon and Thur  90 tablet  3  . simvastatin (ZOCOR) 20 MG tablet Take 1 tablet (20 mg total) by mouth every evening.  90 tablet  3  . warfarin (COUMADIN) 4 MG tablet Take as directed by coumadin clinic  60 tablet  3   No current facility-administered medications on file prior to visit.     Review of Systems Constitutional:  Negative for fever, chills, activity change and  unexpected weight change.  HEENT:  Negative for hearing loss, ear pain, congestion, neck stiffness and postnasal drip. Negative for sore throat or swallowing problems. Negative for dental complaints.   Eyes: Negative for vision loss or change in visual acuity.  Respiratory: Negative for chest tightness and wheezing. Negative for DOE.   Cardiovascular: Negative for chest pain or palpitations. No decreased exercise tolerance Gastrointestinal: No change in bowel habit. No bloating or gas. No reflux or indigestion Genitourinary: Negative for urgency, frequency, flank pain and difficulty urinating.  Musculoskeletal: Negative for myalgias, back pain, arthralgias and gait problem.  Neurological: Negative for dizziness, tremors, weakness and headaches.  Hematological: Negative for adenopathy.  Psychiatric/Behavioral: Negative for behavioral problems and dysphoric mood.       Objective:   Physical Exam Filed Vitals:   11/28/13 0839  BP: 130/68  Pulse: 85  Temp: 98.4 F (36.9 C)   Wt Readings from Last 3 Encounters:  11/28/13 122 lb 9.6 oz (55.611 kg)  11/14/13 125 lb (56.7 kg)  11/02/13 129 lb 1.9 oz (58.568 kg)   BP Readings from Last 3 Encounters:  11/28/13 130/68  11/14/13 113/54  11/02/13 120/60   Gen'l- elderly slender woman in no distress HEENT- C&S clear Neck- supple Cor- well healed sternotomy scare, quiet precordium, RRR with soft murmur Pulm - normal respirations, Lungs CTAP Neuro - Awake, alert, cognition normal. MS - GU&G normal, minimal assist for balance to get up to exam table. Normal gait.  Lab Jan 12th: normal Bmet       Assessment & Plan:

## 2013-11-28 NOTE — Progress Notes (Signed)
Pre visit review using our clinic review tool, if applicable. No additional management support is needed unless otherwise documented below in the visit note. 

## 2013-11-28 NOTE — Patient Instructions (Signed)
Thanks for coming in to see me. You seem to be doing well. As I retire I would like you to see Dr. Yetta Barre in 3 months.  Your limited exam is normal. You have made a good recovery from your surgeries.  Lab today: will check kidney function, blood counts, B12, long term blood sugar test - A1C which when last checked was above goal of 7% at  7.6%. You will receive a letter with recommendations.  It is fine for you to see a podiatrist for nail care - as a medicare patient with diabetes this should be covered.  Immunizations - will give you Prevnar pneumonia vaccine today. You do need to have the shingles vaccine in 8 weeks.  Diabetes management: as a non-insulin dependent diabetic you do not need to check your blood sugar at home unless you feel bad. The signs of low blood sugar are weakness, light-headedness, becoming cool and clammy. If this should happen take something sweet - OJ with sugar is great or a piece of candy. Check your blood sugar as soon as possible after getting something sweet on board. If you must check for your peace of mind limit yourself to 3 times a week maximum. Please do not make changes in your medication unless advised by your medical provider.  Keep up the exercise - good for heart, diabetes and sense of well being. Always have a snack with you when you go to exercise.  Please get back on schedule with the eye doctor. When you schedule routine dental call the office so a Rx can be sent in for cardiac prophylaxis.  To me you seem able to resume limited work at this time.

## 2013-11-29 LAB — COMPREHENSIVE METABOLIC PANEL
ALT: 12 U/L (ref 0–35)
AST: 19 U/L (ref 0–37)
Albumin: 4.2 g/dL (ref 3.5–5.2)
Alkaline Phosphatase: 33 U/L — ABNORMAL LOW (ref 39–117)
BUN: 76 mg/dL — AB (ref 6–23)
CALCIUM: 9.7 mg/dL (ref 8.4–10.5)
CHLORIDE: 97 meq/L (ref 96–112)
CO2: 29 meq/L (ref 19–32)
Creatinine, Ser: 1.6 mg/dL — ABNORMAL HIGH (ref 0.4–1.2)
GFR: 32.16 mL/min — AB (ref >60.00)
Glucose, Bld: 114 mg/dL — ABNORMAL HIGH (ref 70–99)
Potassium: 4.1 mEq/L (ref 3.5–5.1)
Sodium: 136 mEq/L (ref 135–145)
Total Bilirubin: 0.8 mg/dL (ref 0.3–1.2)
Total Protein: 7.2 g/dL (ref 6.0–8.3)

## 2013-11-29 LAB — LIPID PANEL
CHOLESTEROL: 212 mg/dL — AB (ref 0–200)
HDL: 69.3 mg/dL (ref >39.00)
Total CHOL/HDL Ratio: 3
Triglycerides: 135 mg/dL (ref 0.0–149.0)
VLDL: 27 mg/dL (ref 0.0–40.0)

## 2013-11-29 LAB — HEMOGLOBIN AND HEMATOCRIT, BLOOD
HCT: 30.3 % — ABNORMAL LOW (ref 36.0–46.0)
Hemoglobin: 9.8 g/dL — ABNORMAL LOW (ref 12.0–15.0)

## 2013-11-29 LAB — LDL CHOLESTEROL, DIRECT: LDL DIRECT: 120.6 mg/dL

## 2013-11-29 LAB — HEMOGLOBIN A1C: Hgb A1c MFr Bld: 6.2 % (ref 4.6–6.5)

## 2013-11-29 LAB — VITAMIN B12: Vitamin B-12: 442 pg/mL (ref 211–911)

## 2013-11-29 NOTE — Assessment & Plan Note (Signed)
Interval history remarkable for AVR/MVR, CHF now doing well. Physical exam - limited but OK. Follow up labs ordered and pending. She is current with colorectal cancer screening and breast cancer screening. Immunizations are current except for shingles vaccine.   She continues with rehab and is feeling well. She seems medically stable to resume work on a limited basis - no more than 4 hrs per day.  In summary A delightful woman who is making a very good recovery form valve surgery and who appears stable. She will be notified of lab results. She should see PCP in 3 months or sooner as needed.

## 2013-11-29 NOTE — Assessment & Plan Note (Signed)
Appears stable at today's exam: no SOB with normal respiratios, no JVD  Plan Continue present medications  Follow up with cardiology as scheduled.

## 2013-11-29 NOTE — Assessment & Plan Note (Signed)
BP Readings from Last 3 Encounters:  11/28/13 130/68  11/14/13 113/54  11/02/13 120/60   Good control on present medications. Last Bmet was normal.  Plan Continue present medication

## 2013-11-29 NOTE — Assessment & Plan Note (Signed)
Taking and tolerating "Statin" medication. Last LDL 110 1 year ago.  Plan Continue medication.  F/u Lipid panel with recommendations to follow

## 2013-11-29 NOTE — Assessment & Plan Note (Signed)
Last A1C in August 7.6%. She does monitor CBGs at home and for the last weeks has had reasonable control. She did stop medication briefly and had hyperglycemia. No reports of hypoglycemia. Educated her and her daughter about meds, desired CBG levels, symptoms of hypoglycemia and treatment, appropriate CBG monitoring ( no more than 3 times a week or if symptomatic).  Plan Continue metformin  CBG monitoring prn  F/u A1C

## 2013-11-30 ENCOUNTER — Encounter (HOSPITAL_COMMUNITY)
Admission: RE | Admit: 2013-11-30 | Discharge: 2013-11-30 | Disposition: A | Discharge status: Home / Self Care | Payer: Medicare Other | Source: Ambulatory Visit | Attending: Internal Medicine | Admitting: Internal Medicine

## 2013-12-01 LAB — GLUCOSE, CAPILLARY
GLUCOSE-CAPILLARY: 103 mg/dL — AB (ref 70–99)
GLUCOSE-CAPILLARY: 161 mg/dL — AB (ref 70–99)

## 2013-12-02 ENCOUNTER — Encounter (HOSPITAL_COMMUNITY)
Admission: RE | Admit: 2013-12-02 | Discharge: 2013-12-02 | Disposition: A | Discharge status: Home / Self Care | Payer: Medicare Other | Source: Ambulatory Visit | Attending: Internal Medicine | Admitting: Internal Medicine

## 2013-12-02 LAB — GLUCOSE, CAPILLARY: GLUCOSE-CAPILLARY: 153 mg/dL — AB (ref 70–99)

## 2013-12-04 ENCOUNTER — Encounter: Payer: Self-pay | Admitting: Internal Medicine

## 2013-12-05 ENCOUNTER — Other Ambulatory Visit (HOSPITAL_COMMUNITY): Payer: Self-pay | Admitting: Cardiology

## 2013-12-05 ENCOUNTER — Encounter (HOSPITAL_COMMUNITY)
Admission: RE | Admit: 2013-12-05 | Discharge: 2013-12-05 | Disposition: A | Discharge status: Home / Self Care | Payer: Medicare Other | Source: Ambulatory Visit | Attending: Internal Medicine | Admitting: Internal Medicine

## 2013-12-05 DIAGNOSIS — I6529 Occlusion and stenosis of unspecified carotid artery: Secondary | ICD-10-CM

## 2013-12-05 LAB — GLUCOSE, CAPILLARY: Glucose-Capillary: 116 mg/dL — ABNORMAL HIGH (ref 70–99)

## 2013-12-07 ENCOUNTER — Encounter (HOSPITAL_COMMUNITY): Payer: Medicare Other

## 2013-12-08 ENCOUNTER — Encounter (HOSPITAL_COMMUNITY): Payer: Medicare Other

## 2013-12-09 ENCOUNTER — Encounter (HOSPITAL_COMMUNITY): Payer: Medicare Other

## 2013-12-12 ENCOUNTER — Ambulatory Visit (INDEPENDENT_AMBULATORY_CARE_PROVIDER_SITE_OTHER): Payer: Medicare Other | Admitting: Pharmacist

## 2013-12-12 ENCOUNTER — Encounter (HOSPITAL_COMMUNITY): Payer: Self-pay

## 2013-12-12 ENCOUNTER — Encounter (HOSPITAL_COMMUNITY)
Admission: RE | Admit: 2013-12-12 | Discharge: 2013-12-12 | Disposition: A | Discharge status: Home / Self Care | Payer: Medicare Other | Source: Ambulatory Visit | Attending: Internal Medicine | Admitting: Internal Medicine

## 2013-12-12 ENCOUNTER — Ambulatory Visit (HOSPITAL_COMMUNITY)
Admission: RE | Admit: 2013-12-12 | Discharge: 2013-12-12 | Disposition: A | Discharge status: Home / Self Care | Payer: Medicare Other | Source: Ambulatory Visit | Attending: Internal Medicine | Admitting: Internal Medicine

## 2013-12-12 ENCOUNTER — Ambulatory Visit (HOSPITAL_BASED_OUTPATIENT_CLINIC_OR_DEPARTMENT_OTHER): Payer: Medicare Other

## 2013-12-12 VITALS — BP 124/67 | HR 80 | Wt 126.5 lb

## 2013-12-12 DIAGNOSIS — I5022 Chronic systolic (congestive) heart failure: Secondary | ICD-10-CM | POA: Insufficient documentation

## 2013-12-12 DIAGNOSIS — I6529 Occlusion and stenosis of unspecified carotid artery: Secondary | ICD-10-CM

## 2013-12-12 DIAGNOSIS — I4891 Unspecified atrial fibrillation: Secondary | ICD-10-CM

## 2013-12-12 DIAGNOSIS — Z952 Presence of prosthetic heart valve: Secondary | ICD-10-CM

## 2013-12-12 DIAGNOSIS — Z953 Presence of xenogenic heart valve: Secondary | ICD-10-CM

## 2013-12-12 DIAGNOSIS — Z5181 Encounter for therapeutic drug level monitoring: Secondary | ICD-10-CM

## 2013-12-12 DIAGNOSIS — E785 Hyperlipidemia, unspecified: Secondary | ICD-10-CM | POA: Insufficient documentation

## 2013-12-12 DIAGNOSIS — I6350 Cerebral infarction due to unspecified occlusion or stenosis of unspecified cerebral artery: Secondary | ICD-10-CM

## 2013-12-12 DIAGNOSIS — I10 Essential (primary) hypertension: Secondary | ICD-10-CM | POA: Insufficient documentation

## 2013-12-12 LAB — POCT INR: INR: 1.4

## 2013-12-12 LAB — GLUCOSE, CAPILLARY: Glucose-Capillary: 157 mg/dL — ABNORMAL HIGH (ref 70–99)

## 2013-12-12 MED ORDER — METOLAZONE 2.5 MG PO TABS: ORAL_TABLET | ORAL | Status: DC

## 2013-12-12 MED ORDER — POTASSIUM CHLORIDE CRYS ER 20 MEQ PO TBCR: EXTENDED_RELEASE_TABLET | ORAL | Status: DC

## 2013-12-12 NOTE — Patient Instructions (Addendum)
Cut your metolazone back to every Monday. If your weight gets above 126 lbs on home scale take an extra metolazone.   Carotid dopplers 12/19/2013 at 9 am. *Check in as normal for Heart Failure clinic visit - same desk - they will instruct you from there!   Call any issues [phone]  F/U 2 months  Do the following things EVERYDAY: 1) Weigh yourself in the morning before breakfast. Write it down and keep it in a log. 2) Take your medicines as prescribed 3) Eat low salt foods-Limit salt (sodium) to 2000 mg per day.  4) Stay as active as you can everyday 5) Limit all fluids for the day to less than 2 liters

## 2013-12-12 NOTE — Progress Notes (Signed)
Patient ID: Joanna [name], female   DOB: 12/28/1929, 80 y.o.   MRN: [id number]  PCP: Dr Debby BudNorins  HPI: Joanna [name] is a 80 year old woman with aortic stenosis s/p AVR/mitral valve replacement (06/15/2013), systolic HF due to NICM, hypertension, diabetes mellitus type II, hyperlipidemia, and peripheral artery disease. Underwent AVR/MVR with placement of epicardial lead on 06/15/13 with PAF post-op.   Admitted initially in 09/2011 with acute systolic HF, EF 30%. Cath with normal coronaries and normal cardiac output (see below). AS was mild to moderate. In hospital also found to be iron-deficiency anemia. Previous colonoscopies normal. Seen by GI who suggested trial of iron and see if it improved.  09/2011: RA = 8 RV = 58/2/11 PA = 54/22 (37) PCW = 20 Fick cardiac output/index = 4.57/2.67 PVR = 3.7 Woods   05/09/13 ECHO EF 30%, mild LVH, moderate MR, septal-lateral dyssynchrony, suspect severe AS with AVA 0.6 and peak/mean gradient 63/33 mmHg.   05/19/2013 ECHO EF 35% severe AS (low gradient severe AS confirmed by DSE), gradient: 32mm Hg (S). Peak gradient: 57mm Hg  08/18/13 ECHO EF 45-50% AV working well  S/P AVR/MVR with placement of epicardial lead on 06/15/13. She had post-op atrial fibrillation that terminated with amiodarone.  She is off amiodarone now due to nausea. Was discharged from the hospital 06/27/13.   Follow up: Last visit increased metolazone to twice weekly. Doing well. Still participating in CR will finish next week. Denies SOB, orthopnea, CP or edema. Weight at home 120-124lbs. Following a low salt diet and drinking less than 2L a day. Denies palpitations. Able to walk around the whole grocery store with no issues.   Labs (9/14): K 5.4, creatinine 1.69    (09/2013): K+ 4.6, creatinine 1.08, pro-BNP 11643   (10/17/13): K+ 4.4, creatinine 1.6       (11/28/13): K+ 4.1, Creatinine 1.6   ROS: All systems negative except as listed in HPI, PMH and Problem List.  Past Medical History   Diagnosis Date  . Peripheral artery disease   . Hyperlipidemia     takes Simvastatin daily  . PONV (postoperative nausea and vomiting)   . Shortness of breath   . Arthritis   . Aortic stenosis 09/21/2011  . AORTIC VALVE SCLEROSIS 02/21/2010  . 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 BUNDLE BRANCH BLOCK, LEFT 07/30/2007  . CAROTID ARTERY STENOSIS 01/05/2009  . CVA 03/14/2010  . 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 PERIPHERAL VASCULAR DISEASE 07/30/2007  . POLYPECTOMY, HX OF 07/30/2007  . Constipation     takes stool softener daily  . CHF (congestive heart failure)     takes lasix daily  . Diabetes mellitus     takes metformin daily and actos   . DM 07/30/2007  . HTN (hypertension)     takes Carvedilol daily and losartan  . HYPERTENSION 07/30/2007  . Weakness     both hands  . Dizziness   . Joint pain   . Itching     on legs d/t dry skin  . Anemia     takes Iron pill daily  . Iron deficiency anemia 09/21/2011  . Nocturia   . History of blood transfusion     no abnormal reaction noted  . History of shingles   . Mitral regurgitation   . S/P aortic valve replacement with bioprosthetic valve 06/15/2013    [facility] bovine pericardial tissue valve  . S/P mitral valve replacement with bioprosthetic valve 06/15/2013    [facility]  Mitral bovine pericardial tissue valve     Current Outpatient Prescriptions  Medication Sig Dispense Refill  . amLODipine (NORVASC) 5 MG tablet Take 1 tablet (5 mg total) by mouth daily.  30 tablet  3  . carvedilol (COREG) 6.25 MG tablet TAKE 1 TABLET BY MOUTH TWICE A DAY WITH MEALS  60 tablet  5  . docusate sodium (COLACE) 100 MG capsule Take 100 mg by mouth 2 (two) times daily.        . ferrous sulfate 325 (65 FE) MG tablet Take 325 mg by mouth 2 (two) times daily with a meal.      . furosemide (LASIX) 40 MG tablet Take 1 tablet (40 mg total) by mouth 2 (two) times daily.  60 tablet  3  . lactose free nutrition (BOOST) LIQD Take 237 mLs  by mouth daily.      Marland Kitchen losartan (COZAAR) 100 MG tablet Take 100 mg by mouth daily.      . metFORMIN (GLUCOPHAGE) 1000 MG tablet Take 1,000 mg by mouth 2 (two) times daily with a meal.      . metolazone (ZAROXOLYN) 2.5 MG tablet Take 2.5 mg (1 tablet) every Monday and 2.5 mg (1 tablet) every Thursday or as instructed by provider  10 tablet  3  . Potassium Chloride ER 20 MEQ TBCR Take 20 meq (1 tablet) twice a day. On Mondays and Thursdays take an extra potassium tablet.  68 tablet  3  . potassium chloride SA (KLOR-CON M20) 20 MEQ tablet Take 1 tablet (20 mEq total) by mouth daily. Take extra tab on Mon and Thur  90 tablet  3  . simvastatin (ZOCOR) 20 MG tablet Take 1 tablet (20 mg total) by mouth every evening.  90 tablet  3  . warfarin (COUMADIN) 4 MG tablet Take as directed by coumadin clinic  60 tablet  3   No current facility-administered medications for this encounter.    Filed Vitals:   12/12/13 0844  BP: 124/67  Pulse: 80  Weight: 126 lb 8 oz (57.38 kg)  SpO2: 100%    PHYSICAL EXAM: General:  Elderly. NAD; daughter present HEENT: normal Neck: supple. JVP 7-8. Carotids 2+ bilaterally; no bruits. No lymphadenopathy or thryomegaly appreciated. Cor: PMI normal. Regular rate & rhythm.  Paradoxical S2 split.  2/6 SEM. Sternotomy scar well-healed Lungs: crackles through out Abdomen: soft, nontender, nondistended. No hepatosplenomegaly. No bruits or masses. Good bowel sounds. Extremities: no cyanosis, clubbing, rash, trace edema; TED hose intact  Neuro: alert & orientedx3, cranial nerves grossly intact. Moves all 4 extremities w/o difficulty. Affect pleasant  ASSESSMENT & PLAN:  1. Chronic diastolic CHF: EF improved to 50-55% (07/2013) post-AVR/MVR.  - NYHA class II symptoms and volume status stable. Last Cr 1.6 which is elevated for her, but think new baseline is 1.3-1.5. Will cut metolazone back to 2.5 mg every Monday and instructed to take extra dose if home weight greater than 126  lbs. Continue lasix 40 mg BID.   - Continue coreg 6.25 mg BID and losartan 100 mg daily. SBP stable.  - Reinforced the need and importance of daily weights, a low sodium diet, and fluid restriction (less than 2 L a day). Instructed to call the HF clinic if weight increases more than 3 lbs overnight or 5 lbs in a week.  2. Carotid stenosis: Carotid dopplers 11/2012 with 40-59% RICA stenosis, repeat in 11/2013. Will order today. 3. Hyperlipidemia: Managed by PCP. Continue statin. 4. Aortic stenosis/mitral regurg: s/p bioprosthetic  AVR/MVR.  Well-seated valves on post-op echo (07/2013). Continue to follow. 5. CKD: Last Cr elevated 1.6. As above cut metolazone back to 2.5 mg every Monday. Believe new baseline will be 1.3-1.5. 6. HTN: Stable. Will continue current medications.    F/U 2 months with BMET  Aundria Rud, NP-C 8:48 AM 12/12/2013

## 2013-12-14 ENCOUNTER — Encounter (HOSPITAL_COMMUNITY)
Admission: RE | Admit: 2013-12-14 | Discharge: 2013-12-14 | Disposition: A | Discharge status: Home / Self Care | Payer: Medicare Other | Source: Ambulatory Visit | Attending: Internal Medicine | Admitting: Internal Medicine

## 2013-12-14 LAB — GLUCOSE, CAPILLARY: Glucose-Capillary: 132 mg/dL — ABNORMAL HIGH (ref 70–99)

## 2013-12-16 ENCOUNTER — Encounter (HOSPITAL_COMMUNITY)
Admission: RE | Admit: 2013-12-16 | Discharge: 2013-12-16 | Disposition: A | Discharge status: Home / Self Care | Payer: Medicare Other | Source: Ambulatory Visit | Attending: Internal Medicine | Admitting: Internal Medicine

## 2013-12-16 LAB — GLUCOSE, CAPILLARY: Glucose-Capillary: 140 mg/dL — ABNORMAL HIGH (ref 70–99)

## 2013-12-19 ENCOUNTER — Ambulatory Visit (HOSPITAL_COMMUNITY)
Admission: RE | Admit: 2013-12-19 | Discharge: 2013-12-19 | Disposition: A | Discharge status: Home / Self Care | Payer: Medicare Other | Source: Ambulatory Visit | Attending: Internal Medicine | Admitting: Internal Medicine

## 2013-12-19 ENCOUNTER — Encounter (HOSPITAL_COMMUNITY)
Admission: RE | Admit: 2013-12-19 | Discharge: 2013-12-19 | Disposition: A | Discharge status: Home / Self Care | Payer: Medicare Other | Source: Ambulatory Visit | Attending: Internal Medicine | Admitting: Internal Medicine

## 2013-12-19 ENCOUNTER — Ambulatory Visit (INDEPENDENT_AMBULATORY_CARE_PROVIDER_SITE_OTHER): Payer: Medicare Other

## 2013-12-19 DIAGNOSIS — Z5181 Encounter for therapeutic drug level monitoring: Secondary | ICD-10-CM

## 2013-12-19 DIAGNOSIS — I5022 Chronic systolic (congestive) heart failure: Secondary | ICD-10-CM | POA: Diagnosis not present

## 2013-12-19 DIAGNOSIS — Z5189 Encounter for other specified aftercare: Secondary | ICD-10-CM | POA: Insufficient documentation

## 2013-12-19 DIAGNOSIS — Z952 Presence of prosthetic heart valve: Secondary | ICD-10-CM

## 2013-12-19 DIAGNOSIS — I4891 Unspecified atrial fibrillation: Secondary | ICD-10-CM

## 2013-12-19 DIAGNOSIS — I10 Essential (primary) hypertension: Secondary | ICD-10-CM | POA: Diagnosis not present

## 2013-12-19 DIAGNOSIS — I739 Peripheral vascular disease, unspecified: Secondary | ICD-10-CM | POA: Diagnosis not present

## 2013-12-19 DIAGNOSIS — E119 Type 2 diabetes mellitus without complications: Secondary | ICD-10-CM | POA: Insufficient documentation

## 2013-12-19 DIAGNOSIS — I6350 Cerebral infarction due to unspecified occlusion or stenosis of unspecified cerebral artery: Secondary | ICD-10-CM

## 2013-12-19 DIAGNOSIS — Z953 Presence of xenogenic heart valve: Secondary | ICD-10-CM

## 2013-12-19 DIAGNOSIS — I6529 Occlusion and stenosis of unspecified carotid artery: Secondary | ICD-10-CM

## 2013-12-19 LAB — POCT INR: INR: 1.3

## 2013-12-19 LAB — GLUCOSE, CAPILLARY: Glucose-Capillary: 138 mg/dL — ABNORMAL HIGH (ref 70–99)

## 2013-12-19 MED ORDER — WARFARIN SODIUM 4 MG PO TABS: ORAL_TABLET | ORAL | Status: DC

## 2013-12-20 ENCOUNTER — Ambulatory Visit (INDEPENDENT_AMBULATORY_CARE_PROVIDER_SITE_OTHER): Payer: Medicare Other

## 2013-12-20 VITALS — BP 119/61 | HR 79 | Resp 16 | Ht 65.0 in | Wt 125.0 lb

## 2013-12-20 DIAGNOSIS — E1142 Type 2 diabetes mellitus with diabetic polyneuropathy: Secondary | ICD-10-CM

## 2013-12-20 DIAGNOSIS — L608 Other nail disorders: Secondary | ICD-10-CM

## 2013-12-20 DIAGNOSIS — E1149 Type 2 diabetes mellitus with other diabetic neurological complication: Secondary | ICD-10-CM

## 2013-12-20 DIAGNOSIS — E1140 Type 2 diabetes mellitus with diabetic neuropathy, unspecified: Secondary | ICD-10-CM

## 2013-12-20 DIAGNOSIS — I6529 Occlusion and stenosis of unspecified carotid artery: Secondary | ICD-10-CM

## 2013-12-20 DIAGNOSIS — Q828 Other specified congenital malformations of skin: Secondary | ICD-10-CM

## 2013-12-20 NOTE — Progress Notes (Signed)
   Subjective:    Patient ID: Joanna [name], female    DOB: December 02, 1929, 80 y.o.   MRN: [id number]  HPI Comments: "I need the nails cut"  Patient states that she can no longer cut her toenails. The are thick and discolored, especially the big toenails bilateral. Her daughter trimmed them the last time and she said that she doesn't feel ccomfortabledoing it due to her being on coumadin and a diabetic.   Diabetes Associated symptoms include weakness.      Review of Systems  Cardiovascular: Positive for leg swelling.  Musculoskeletal: Positive for arthralgias and gait problem.  Skin:       Open sore(boil inner thigh) Change in nails   Neurological: Positive for weakness.  All other systems reviewed and are negative.       Objective:   Physical Exam Lower sure you objective findings as follows vascular status is intact with DP plus one over 4 bilateral PT thready at one over 4 zero over four bilateral. There is +1 to +2 edema both lower extremities. Patient had recent valve replacement surgery her cardiac surgery in the past 6 months. [place] or stockings but did not have my today for convenience of difficulty removing including back on to A refill time 3 seconds all digits neurologically epicritic and proprioceptive sensations appear to be intact and symmetric bilateral there is normal plantar response DTRs not elicited vibratory sensation is intact there is normal plantar response noted skin is dry and friable there is atrophy of fat pad plantarly. Patient does have some burning at times although mild neuropathy as noted protective sensation is intact. Patient does have mild digital contractures nails thick brittle friable discolored with early onychomycosis and proptosis of nails being noted. Orthopedic biomechanical exam rectus foot type mild digital contractures adductovarus rotation lesser digits the foot is otherwise rectus patient is able to wear appropriate accommodative shoes as he  is crocs around the house sometimes which is beneficial advised in appropriate diabetic foot and shoewear are in the future palliative care in suggest every 3 month followup for debridement of nails. Patient also some diffuse keratoses around adjacent did distal tuft of the digits to adductovarus rotation of digits no open wounds ulcerations no secondary infection is noted.       Assessment & Plan:  Assessment diabetes with minimal or early peripheral neuropathy. Patient does have some angiopathy with decreased blood flow in week blood flow and some edema as well. Will monitor for angiopathy neuropathy the future recommend diabetic foot nail care every 3 months for palliative care maintain appropriate coming shoes as recommended or instructed reappointed 3 months for diabetic foot nail care followup  Alvan Dame DPM

## 2013-12-20 NOTE — Patient Instructions (Signed)
Diabetes and Foot Care Diabetes may cause you to have problems because of poor blood supply (circulation) to your feet and legs. This may cause the skin on your feet to become thinner, break easier, and heal more slowly. Your skin may become dry, and the skin may peel and crack. You may also have nerve damage in your legs and feet causing decreased feeling in them. You may not notice minor injuries to your feet that could lead to infections or more serious problems. Taking care of your feet is one of the most important things you can do for yourself.  HOME CARE INSTRUCTIONS  Wear shoes at all times, even in the house. Do not go barefoot. Bare feet are easily injured.  Check your feet daily for blisters, cuts, and redness. If you cannot see the bottom of your feet, use a mirror or ask someone for help.  Wash your feet with warm water (do not use hot water) and mild soap. Then pat your feet and the areas between your toes until they are completely dry. Do not soak your feet as this can dry your skin.  Apply a moisturizing lotion or petroleum jelly (that does not contain alcohol and is unscented) to the skin on your feet and to dry, brittle toenails. Do not apply lotion between your toes.  Trim your toenails straight across. Do not dig under them or around the cuticle. File the edges of your nails with an emery board or nail file.  Do not cut corns or calluses or try to remove them with medicine.  Wear clean socks or stockings every day. Make sure they are not too tight. Do not wear knee-high stockings since they may decrease blood flow to your legs.  Wear shoes that fit properly and have enough cushioning. To break in new shoes, wear them for just a few hours a day. This prevents you from injuring your feet. Always look in your shoes before you put them on to be sure there are no objects inside.  Do not cross your legs. This may decrease the blood flow to your feet.  If you find a minor scrape,  cut, or break in the skin on your feet, keep it and the skin around it clean and dry. These areas may be cleansed with mild soap and water. Do not cleanse the area with peroxide, alcohol, or iodine.  When you remove an adhesive bandage, be sure not to damage the skin around it.  If you have a wound, look at it several times a day to make sure it is healing.  Do not use heating pads or hot water bottles. They may burn your skin. If you have lost feeling in your feet or legs, you may not know it is happening until it is too late.  Make sure your health care provider performs a complete foot exam at least annually or more often if you have foot problems. Report any cuts, sores, or bruises to your health care provider immediately. SEEK MEDICAL CARE IF:   You have an injury that is not healing.  You have cuts or breaks in the skin.  You have an ingrown nail.  You notice redness on your legs or feet.  You feel burning or tingling in your legs or feet.  You have pain or cramps in your legs and feet.  Your legs or feet are numb.  Your feet always feel cold. SEEK IMMEDIATE MEDICAL CARE IF:   There is increasing redness,   swelling, or pain in or around a wound.  There is a red line that goes up your leg.  Pus is coming from a wound.  You develop a fever or as directed by your health care provider.  You notice a bad smell coming from an ulcer or wound. Document Released: 10/03/2000 Document Revised: 06/08/2013 Document Reviewed: 03/15/2013 [employer] Patient Information 2014 [employer].  

## 2013-12-21 ENCOUNTER — Encounter (HOSPITAL_COMMUNITY)
Admission: RE | Admit: 2013-12-21 | Discharge: 2013-12-21 | Disposition: A | Discharge status: Home / Self Care | Payer: Medicare Other | Source: Ambulatory Visit | Attending: Internal Medicine | Admitting: Internal Medicine

## 2013-12-21 DIAGNOSIS — Z5189 Encounter for other specified aftercare: Secondary | ICD-10-CM | POA: Diagnosis not present

## 2013-12-21 LAB — GLUCOSE, CAPILLARY
GLUCOSE-CAPILLARY: 106 mg/dL — AB (ref 70–99)
GLUCOSE-CAPILLARY: 150 mg/dL — AB (ref 70–99)

## 2013-12-23 ENCOUNTER — Encounter (HOSPITAL_COMMUNITY)
Admission: RE | Admit: 2013-12-23 | Discharge: 2013-12-23 | Disposition: A | Discharge status: Home / Self Care | Payer: Medicare Other | Source: Ambulatory Visit | Attending: Internal Medicine | Admitting: Internal Medicine

## 2013-12-23 DIAGNOSIS — Z5189 Encounter for other specified aftercare: Secondary | ICD-10-CM | POA: Diagnosis not present

## 2013-12-23 LAB — GLUCOSE, CAPILLARY: Glucose-Capillary: 126 mg/dL — ABNORMAL HIGH (ref 70–99)

## 2013-12-23 NOTE — Progress Notes (Signed)
Pt completed 36 exercise sessions in the phase II outpatient cardiac rehab program.  Pt had excellent attendance and participated in cardiac rehab education classes.  Pt plans to continue home exercise with walking in her neighborhood and in the house when the weather does not permit her to do so outside.  Pt and her children given information regarding the maintenance program here at [facility].  Pt is able to drive and did so before her heart event however the car she drove is in need of repair.  Pt's children questioned whether she should resume driving for safety reasons, pt feel she can drive short distances during daylight hours.  They will talk some more about the maintenance program and decide.   Medication list reconciled with son who assist pt with placing meds in the daily box.  Repeat Phq2 - 0.  Pt met her short term goal with increase strength in her legs by her ability to remain independent in her ADL"s and pt used the stairs while she ambulated on the track on exercise days.  Pt met her long term goal which was to feel better.  Pt has more energy and stamina.  Pt is glad she did the program.  Cherre Huger, BSN

## 2013-12-26 ENCOUNTER — Ambulatory Visit (INDEPENDENT_AMBULATORY_CARE_PROVIDER_SITE_OTHER): Payer: Medicare Other

## 2013-12-26 ENCOUNTER — Encounter (HOSPITAL_COMMUNITY): Payer: Medicare Other

## 2013-12-26 DIAGNOSIS — I6350 Cerebral infarction due to unspecified occlusion or stenosis of unspecified cerebral artery: Secondary | ICD-10-CM

## 2013-12-26 DIAGNOSIS — Z5181 Encounter for therapeutic drug level monitoring: Secondary | ICD-10-CM

## 2013-12-26 DIAGNOSIS — Z952 Presence of prosthetic heart valve: Secondary | ICD-10-CM

## 2013-12-26 DIAGNOSIS — Z953 Presence of xenogenic heart valve: Secondary | ICD-10-CM

## 2013-12-26 DIAGNOSIS — I4891 Unspecified atrial fibrillation: Secondary | ICD-10-CM

## 2013-12-26 LAB — POCT INR: INR: 1.7

## 2013-12-28 ENCOUNTER — Encounter (HOSPITAL_COMMUNITY): Payer: Medicare Other

## 2013-12-29 ENCOUNTER — Telehealth (HOSPITAL_COMMUNITY): Payer: Self-pay | Admitting: *Deleted

## 2013-12-29 NOTE — Telephone Encounter (Signed)
Called with pt's monitor results, per Dr Gala Romney SR w/PVC's, pt's daughter aware

## 2013-12-30 ENCOUNTER — Encounter (HOSPITAL_COMMUNITY): Payer: Medicare Other

## 2014-01-02 ENCOUNTER — Encounter (HOSPITAL_COMMUNITY): Payer: Medicare Other

## 2014-01-02 ENCOUNTER — Ambulatory Visit (INDEPENDENT_AMBULATORY_CARE_PROVIDER_SITE_OTHER): Payer: Medicare Other

## 2014-01-02 DIAGNOSIS — I6350 Cerebral infarction due to unspecified occlusion or stenosis of unspecified cerebral artery: Secondary | ICD-10-CM

## 2014-01-02 DIAGNOSIS — Z5181 Encounter for therapeutic drug level monitoring: Secondary | ICD-10-CM

## 2014-01-02 DIAGNOSIS — Z953 Presence of xenogenic heart valve: Secondary | ICD-10-CM

## 2014-01-02 DIAGNOSIS — I4891 Unspecified atrial fibrillation: Secondary | ICD-10-CM

## 2014-01-02 DIAGNOSIS — Z952 Presence of prosthetic heart valve: Secondary | ICD-10-CM

## 2014-01-02 LAB — POCT INR: INR: 5.4

## 2014-01-04 ENCOUNTER — Encounter (HOSPITAL_COMMUNITY): Payer: Medicare Other

## 2014-01-06 ENCOUNTER — Encounter (HOSPITAL_COMMUNITY): Payer: Medicare Other

## 2014-01-09 ENCOUNTER — Ambulatory Visit (INDEPENDENT_AMBULATORY_CARE_PROVIDER_SITE_OTHER): Payer: Medicare Other | Admitting: Pharmacist

## 2014-01-09 DIAGNOSIS — Z5181 Encounter for therapeutic drug level monitoring: Secondary | ICD-10-CM

## 2014-01-09 DIAGNOSIS — I6350 Cerebral infarction due to unspecified occlusion or stenosis of unspecified cerebral artery: Secondary | ICD-10-CM

## 2014-01-09 DIAGNOSIS — Z953 Presence of xenogenic heart valve: Secondary | ICD-10-CM

## 2014-01-09 DIAGNOSIS — Z952 Presence of prosthetic heart valve: Secondary | ICD-10-CM

## 2014-01-09 DIAGNOSIS — I4891 Unspecified atrial fibrillation: Secondary | ICD-10-CM

## 2014-01-09 LAB — POCT INR: INR: 2.3

## 2014-01-16 ENCOUNTER — Ambulatory Visit (INDEPENDENT_AMBULATORY_CARE_PROVIDER_SITE_OTHER): Payer: Medicare Other | Admitting: *Deleted

## 2014-01-16 DIAGNOSIS — I4891 Unspecified atrial fibrillation: Secondary | ICD-10-CM

## 2014-01-16 DIAGNOSIS — I6350 Cerebral infarction due to unspecified occlusion or stenosis of unspecified cerebral artery: Secondary | ICD-10-CM

## 2014-01-16 DIAGNOSIS — Z952 Presence of prosthetic heart valve: Secondary | ICD-10-CM

## 2014-01-16 DIAGNOSIS — Z953 Presence of xenogenic heart valve: Secondary | ICD-10-CM

## 2014-01-16 DIAGNOSIS — Z5181 Encounter for therapeutic drug level monitoring: Secondary | ICD-10-CM

## 2014-01-16 LAB — POCT INR: INR: 2

## 2014-01-24 ENCOUNTER — Other Ambulatory Visit: Payer: Self-pay

## 2014-01-24 MED ORDER — METFORMIN HCL 1000 MG PO TABS: 1000.0000 mg | ORAL_TABLET | Freq: Two times a day (BID) | ORAL | Status: DC

## 2014-01-24 MED ORDER — LOSARTAN POTASSIUM 100 MG PO TABS: 100.0000 mg | ORAL_TABLET | Freq: Every day | ORAL | Status: DC

## 2014-01-27 ENCOUNTER — Other Ambulatory Visit (HOSPITAL_COMMUNITY): Payer: Self-pay | Admitting: Anesthesiology

## 2014-01-27 ENCOUNTER — Ambulatory Visit (INDEPENDENT_AMBULATORY_CARE_PROVIDER_SITE_OTHER): Payer: Medicare Other | Admitting: Pharmacist

## 2014-01-27 ENCOUNTER — Other Ambulatory Visit: Payer: Self-pay | Admitting: Internal Medicine

## 2014-01-27 DIAGNOSIS — Z953 Presence of xenogenic heart valve: Secondary | ICD-10-CM

## 2014-01-27 DIAGNOSIS — Z5181 Encounter for therapeutic drug level monitoring: Secondary | ICD-10-CM

## 2014-01-27 DIAGNOSIS — Z952 Presence of prosthetic heart valve: Secondary | ICD-10-CM

## 2014-01-27 DIAGNOSIS — I4891 Unspecified atrial fibrillation: Secondary | ICD-10-CM

## 2014-01-27 DIAGNOSIS — I6350 Cerebral infarction due to unspecified occlusion or stenosis of unspecified cerebral artery: Secondary | ICD-10-CM

## 2014-01-27 LAB — POCT INR: INR: >8

## 2014-01-27 LAB — PROTIME-INR
INR: 7.08 (ref <1.50)
Prothrombin Time: 57.9 seconds — ABNORMAL HIGH (ref 11.6–15.2)

## 2014-01-30 ENCOUNTER — Ambulatory Visit (INDEPENDENT_AMBULATORY_CARE_PROVIDER_SITE_OTHER): Payer: Medicare Other | Admitting: Pharmacist

## 2014-01-30 DIAGNOSIS — Z952 Presence of prosthetic heart valve: Secondary | ICD-10-CM

## 2014-01-30 DIAGNOSIS — Z5181 Encounter for therapeutic drug level monitoring: Secondary | ICD-10-CM

## 2014-01-30 DIAGNOSIS — I6350 Cerebral infarction due to unspecified occlusion or stenosis of unspecified cerebral artery: Secondary | ICD-10-CM

## 2014-01-30 DIAGNOSIS — Z953 Presence of xenogenic heart valve: Secondary | ICD-10-CM

## 2014-01-30 DIAGNOSIS — I4891 Unspecified atrial fibrillation: Secondary | ICD-10-CM

## 2014-01-30 LAB — POCT INR: INR: 2

## 2014-01-31 ENCOUNTER — Other Ambulatory Visit: Payer: Self-pay

## 2014-01-31 MED ORDER — LOSARTAN POTASSIUM 100 MG PO TABS: ORAL_TABLET | ORAL | Status: DC

## 2014-01-31 MED ORDER — AMLODIPINE BESYLATE 5 MG PO TABS: 5.0000 mg | ORAL_TABLET | Freq: Every day | ORAL | Status: DC

## 2014-02-01 ENCOUNTER — Other Ambulatory Visit: Payer: Self-pay

## 2014-02-01 MED ORDER — CARVEDILOL 6.25 MG PO TABS: ORAL_TABLET | ORAL | Status: DC

## 2014-02-06 ENCOUNTER — Encounter (HOSPITAL_COMMUNITY): Payer: Self-pay

## 2014-02-06 ENCOUNTER — Other Ambulatory Visit: Payer: Self-pay

## 2014-02-06 ENCOUNTER — Ambulatory Visit (HOSPITAL_COMMUNITY)
Admission: RE | Admit: 2014-02-06 | Discharge: 2014-02-06 | Disposition: A | Discharge status: Home / Self Care | Payer: Medicare Other | Source: Ambulatory Visit | Attending: Internal Medicine | Admitting: Internal Medicine

## 2014-02-06 VITALS — BP 111/67 | HR 90 | Resp 18 | Wt 135.4 lb

## 2014-02-06 DIAGNOSIS — I350 Nonrheumatic aortic (valve) stenosis: Secondary | ICD-10-CM

## 2014-02-06 DIAGNOSIS — I509 Heart failure, unspecified: Secondary | ICD-10-CM | POA: Insufficient documentation

## 2014-02-06 DIAGNOSIS — N183 Chronic kidney disease, stage 3 unspecified: Secondary | ICD-10-CM

## 2014-02-06 DIAGNOSIS — I5032 Chronic diastolic (congestive) heart failure: Secondary | ICD-10-CM | POA: Insufficient documentation

## 2014-02-06 DIAGNOSIS — I6529 Occlusion and stenosis of unspecified carotid artery: Secondary | ICD-10-CM | POA: Insufficient documentation

## 2014-02-06 DIAGNOSIS — I359 Nonrheumatic aortic valve disorder, unspecified: Secondary | ICD-10-CM | POA: Insufficient documentation

## 2014-02-06 DIAGNOSIS — Z952 Presence of prosthetic heart valve: Secondary | ICD-10-CM

## 2014-02-06 DIAGNOSIS — Z953 Presence of xenogenic heart valve: Secondary | ICD-10-CM

## 2014-02-06 LAB — BASIC METABOLIC PANEL
BUN: 59 mg/dL — ABNORMAL HIGH (ref 6–23)
CHLORIDE: 103 meq/L (ref 96–112)
CO2: 28 mEq/L (ref 19–32)
Calcium: 10 mg/dL (ref 8.4–10.5)
Creatinine, Ser: 1.25 mg/dL — ABNORMAL HIGH (ref 0.50–1.10)
GFR calc Af Amer: 45 mL/min — ABNORMAL LOW (ref >90)
GFR calc non Af Amer: 39 mL/min — ABNORMAL LOW (ref >90)
Glucose, Bld: 121 mg/dL — ABNORMAL HIGH (ref 70–99)
Potassium: 4.9 mEq/L (ref 3.7–5.3)
Sodium: 144 mEq/L (ref 137–147)

## 2014-02-06 MED ORDER — CARVEDILOL 6.25 MG PO TABS: ORAL_TABLET | ORAL | Status: DC

## 2014-02-06 NOTE — Progress Notes (Signed)
Patient ID: Joanna [name], female   DOB: 1930-05-24, 80 y.o.   MRN: [id number]  PCP: Dr Yetta Barre ([facility]) CVTS Surgeon: Dr. Cornelius Moras Primary Cardiologist: Dr. Gala Romney  HPI: Ms. Zilles is a 80 year old woman with aortic stenosis s/p AVR/mitral valve replacement (06/15/2013), systolic HF due to NICM, hypertension, diabetes mellitus type II, hyperlipidemia, and peripheral artery disease. Underwent AVR/MVR with placement of epicardial lead on 06/15/13 with PAF post-op.   Admitted initially in 09/2011 with acute systolic HF, EF 30%. Cath with normal coronaries and normal cardiac output (see below). AS was mild to moderate. In hospital also found to be iron-deficiency anemia. Previous colonoscopies normal. Seen by GI who suggested trial of iron and see if it improved.  09/2011: RA = 8 RV = 58/2/11 PA = 54/22 (37) PCW = 20 Fick cardiac output/index = 4.57/2.67 PVR = 3.7 [place]   05/09/13 ECHO EF 30%, mild LVH, moderate MR, septal-lateral dyssynchrony, suspect severe AS with AVA 0.6 and peak/mean gradient 63/33 mmHg.   05/19/2013 ECHO EF 35% severe AS (low gradient severe AS confirmed by DSE), gradient: 22mm Hg (S). Peak gradient: 23mm Hg  08/18/13 ECHO EF 45-50% AV working well  S/P AVR/MVR with placement of epicardial lead on 06/15/13. She had post-op atrial fibrillation that terminated with amiodarone.  She is off amiodarone now due to nausea. Was discharged from the hospital 06/27/13.   Follow up: Doing well. Denies SOB, PND, CP or orthopnea. Finished Cardiac Rehab. Walking about 15-20 a day with no issues. Weight at home 120-130 lbs.Following a low salt diet and drinking less than 2L a day. + occasional palpitations. Able to walk around the whole grocery store with no issues.    Labs (9/14): K 5.4, creatinine 1.69    (09/2013): K+ 4.6, creatinine 1.08, pro-BNP 11643   (10/17/13): K+ 4.4, creatinine 1.6       (11/28/13): K+ 4.1, Creatinine 1.6   ROS: All systems negative except as listed in HPI, PMH and  Problem List.  Past Medical History  Diagnosis Date  . Peripheral artery disease   . Hyperlipidemia     takes Simvastatin daily  . PONV (postoperative nausea and vomiting)   . Shortness of breath   . Arthritis   . Aortic stenosis 09/21/2011  . AORTIC VALVE SCLEROSIS 02/21/2010  . 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 BUNDLE BRANCH BLOCK, LEFT 07/30/2007  . CAROTID ARTERY STENOSIS 01/05/2009  . CVA 03/14/2010  . 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 PERIPHERAL VASCULAR DISEASE 07/30/2007  . POLYPECTOMY, HX OF 07/30/2007  . Constipation   . CHF (congestive heart failure)     a. RHC 09/2011 RA 8, RV 58/2/11; PA 54/22 (37) PCWP 20; F CO/CI 4.57/2.67 PVR 3.7; b. L main no sig dz, LAD luminal irreg; LCx lg Ramus with luminal irreg, small AV Lcx witout sig dz, RCA luminal irreg; severe mitral annular calcifications; AV calcified c. EF 45-50% (07/2013)  . Diabetes mellitus   . DM 07/30/2007  . HTN (hypertension)   . HYPERTENSION 07/30/2007  . Weakness   . Dizziness   . Joint pain   . Itching   . Anemia   . Iron deficiency anemia 09/21/2011  . Nocturia   . History of blood transfusion   . History of shingles   . Mitral regurgitation   . S/P aortic valve replacement with bioprosthetic valve 06/15/2013    21 mm [facility] Ease bovine pericardial tissue valve  . S/P mitral valve replacement with bioprosthetic valve 06/15/2013  [facility] Mitral bovine pericardial tissue valve     Current Outpatient Prescriptions  Medication Sig Dispense Refill  . amLODipine (NORVASC) 5 MG tablet Take 1 tablet (5 mg total) by mouth daily.  90 tablet  0  . carvedilol (COREG) 6.25 MG tablet TAKE 1 TABLET BY MOUTH TWICE A DAY WITH MEALS  90 tablet  0  . docusate sodium (COLACE) 100 MG capsule Take 100 mg by mouth 2 (two) times daily.        . ferrous sulfate 325 (65 FE) MG tablet Take 325 mg by mouth 2 (two) times daily with a meal.      . furosemide (LASIX) 40 MG tablet Take 1 tablet (40 mg total) by  mouth 2 (two) times daily.  180 tablet  3  . lactose free nutrition (BOOST) LIQD Take 237 mLs by mouth daily.      Marland Kitchen. losartan (COZAAR) 100 MG tablet Take 1 tablet by mouth  daily  90 tablet  0  . metFORMIN (GLUCOPHAGE) 1000 MG tablet Take 1 tablet by mouth  twice a day with meals  180 tablet  0  . metolazone (ZAROXOLYN) 2.5 MG tablet Take 1 tablet (2.5 mg total) by mouth once a week. Every Monday  20 tablet  3  . Potassium Chloride ER 20 MEQ TBCR Take 20 meq (1 tablet) twice a day. On Mondays take an extra 20 meq of potassium.      . simvastatin (ZOCOR) 20 MG tablet Take 1 tablet (20 mg total) by mouth every evening.  90 tablet  3  . sitaGLIPtin (JANUVIA) 50 MG tablet Take 50 mg by mouth daily.      Marland Kitchen. warfarin (COUMADIN) 4 MG tablet Take as directed by coumadin clinic  60 tablet  3   No current facility-administered medications for this encounter.    Filed Vitals:   02/06/14 1028  BP: 111/67  Pulse: 90  Resp: 18  Weight: 135 lb 6 oz (61.406 kg)  SpO2: 96%    PHYSICAL EXAM: General:  Elderly. NAD; daughter present HEENT: normal Neck: supple. JVP 8-9 with prominent CV waves. Carotids 2+ bilaterally; no bruits. No lymphadenopathy or thryomegaly appreciated. Cor: PMI normal. Regular rate & rhythm.  Paradoxical S2 split.  2/6 SEM.  Lungs: CTA  Abdomen: soft, nontender, nondistended. No hepatosplenomegaly. No bruits or masses. Good bowel sounds. Extremities: no cyanosis, clubbing, rash, trace edema; TED hose intact  Neuro: alert & orientedx3, cranial nerves grossly intact. Moves all 4 extremities w/o difficulty. Affect pleasant  ASSESSMENT & PLAN:  1. Chronic diastolic CHF: EF improved to 50-55% (07/2013) post-AVR/MVR.  - NYHA class II symptoms and volume status slightly elevated. Today she took her scheduled metolazone and will have her take 2.5 mg tomorrow as well with extra 20 meq of potassium. Would like home weight less than 126 lbs.  - Continue lasix 40 mg BID. BP stable on  losartan, coreg and norvasc. - Check BMET today. - Reinforced the need and importance of daily weights, a low sodium diet, and fluid restriction (less than 2 L a day). Instructed to call the HF clinic if weight increases more than 3 lbs overnight or 5 lbs in a week.  2. Carotid stenosis: Reviewed carotid dopplers; 1-39% LICA, 40-59% RICA, patent vertebral arteries with antegrade flow (11/2013) Repeat in 1 year.  3. Hyperlipidemia: Managed by PCP. Continue statin. 4. Aortic stenosis/mitral regurg: s/p bioprosthetic AVR/MVR.  Well-seated valves on post-op echo (07/2013). Continue to follow. Will repeat  in 07/2014. 5. CKD: Last Cr elevated 1.6. Check today. 6. HTN: Stable. Will continue current medications.     F/U 3 months Aundria Rud, NP-C 10:48 AM 02/06/2014

## 2014-02-06 NOTE — Patient Instructions (Signed)
Doing great.  Take an extra metolazone tomorrow with an extra 20 meq (1 tablet) of potassium  Follow up in 3 months.  Do the following things EVERYDAY: 1) Weigh yourself in the morning before breakfast. Write it down and keep it in a log. 2) Take your medicines as prescribed 3) Eat low salt foods-Limit salt (sodium) to 2000 mg per day.  4) Stay as active as you can everyday 5) Limit all fluids for the day to less than 2 liters 6)

## 2014-02-07 ENCOUNTER — Ambulatory Visit (INDEPENDENT_AMBULATORY_CARE_PROVIDER_SITE_OTHER): Payer: Medicare Other | Admitting: *Deleted

## 2014-02-07 DIAGNOSIS — I6350 Cerebral infarction due to unspecified occlusion or stenosis of unspecified cerebral artery: Secondary | ICD-10-CM

## 2014-02-07 DIAGNOSIS — Z953 Presence of xenogenic heart valve: Secondary | ICD-10-CM

## 2014-02-07 DIAGNOSIS — I4891 Unspecified atrial fibrillation: Secondary | ICD-10-CM

## 2014-02-07 DIAGNOSIS — Z952 Presence of prosthetic heart valve: Secondary | ICD-10-CM

## 2014-02-07 DIAGNOSIS — Z5181 Encounter for therapeutic drug level monitoring: Secondary | ICD-10-CM

## 2014-02-07 LAB — POCT INR: INR: 1.6

## 2014-02-14 ENCOUNTER — Ambulatory Visit (INDEPENDENT_AMBULATORY_CARE_PROVIDER_SITE_OTHER): Payer: Medicare Other | Admitting: *Deleted

## 2014-02-14 ENCOUNTER — Other Ambulatory Visit: Payer: Self-pay | Admitting: Internal Medicine

## 2014-02-14 DIAGNOSIS — I4891 Unspecified atrial fibrillation: Secondary | ICD-10-CM

## 2014-02-14 DIAGNOSIS — Z5181 Encounter for therapeutic drug level monitoring: Secondary | ICD-10-CM

## 2014-02-14 DIAGNOSIS — I6350 Cerebral infarction due to unspecified occlusion or stenosis of unspecified cerebral artery: Secondary | ICD-10-CM

## 2014-02-14 DIAGNOSIS — Z952 Presence of prosthetic heart valve: Secondary | ICD-10-CM

## 2014-02-14 DIAGNOSIS — Z953 Presence of xenogenic heart valve: Secondary | ICD-10-CM

## 2014-02-14 LAB — POCT INR: INR: 2.2

## 2014-02-28 ENCOUNTER — Ambulatory Visit (INDEPENDENT_AMBULATORY_CARE_PROVIDER_SITE_OTHER): Payer: Medicare Other | Admitting: Internal Medicine

## 2014-02-28 ENCOUNTER — Other Ambulatory Visit (INDEPENDENT_AMBULATORY_CARE_PROVIDER_SITE_OTHER): Payer: Medicare Other

## 2014-02-28 ENCOUNTER — Encounter: Payer: Self-pay | Admitting: Internal Medicine

## 2014-02-28 VITALS — BP 108/66 | HR 82 | Temp 97.5°F | Resp 16 | Wt 126.0 lb

## 2014-02-28 DIAGNOSIS — E785 Hyperlipidemia, unspecified: Secondary | ICD-10-CM

## 2014-02-28 DIAGNOSIS — E1165 Type 2 diabetes mellitus with hyperglycemia: Principal | ICD-10-CM

## 2014-02-28 DIAGNOSIS — E1129 Type 2 diabetes mellitus with other diabetic kidney complication: Secondary | ICD-10-CM

## 2014-02-28 DIAGNOSIS — I10 Essential (primary) hypertension: Secondary | ICD-10-CM

## 2014-02-28 DIAGNOSIS — I5022 Chronic systolic (congestive) heart failure: Secondary | ICD-10-CM

## 2014-02-28 DIAGNOSIS — D509 Iron deficiency anemia, unspecified: Secondary | ICD-10-CM

## 2014-02-28 DIAGNOSIS — I2789 Other specified pulmonary heart diseases: Secondary | ICD-10-CM

## 2014-02-28 DIAGNOSIS — I6529 Occlusion and stenosis of unspecified carotid artery: Secondary | ICD-10-CM

## 2014-02-28 LAB — HEMOGLOBIN A1C: HEMOGLOBIN A1C: 6.3 % (ref 4.6–6.5)

## 2014-02-28 MED ORDER — LOSARTAN POTASSIUM 100 MG PO TABS: ORAL_TABLET | ORAL | Status: DC

## 2014-02-28 MED ORDER — SITAGLIPTIN PHOSPHATE 50 MG PO TABS: 50.0000 mg | ORAL_TABLET | Freq: Every day | ORAL | Status: DC

## 2014-02-28 MED ORDER — SIMVASTATIN 20 MG PO TABS: 20.0000 mg | ORAL_TABLET | Freq: Every evening | ORAL | Status: DC

## 2014-02-28 MED ORDER — CARVEDILOL 6.25 MG PO TABS: ORAL_TABLET | ORAL | Status: DC

## 2014-02-28 MED ORDER — METOLAZONE 2.5 MG PO TABS: 2.5000 mg | ORAL_TABLET | ORAL | Status: DC

## 2014-02-28 MED ORDER — FERROUS SULFATE 325 (65 FE) MG PO TABS: 325.0000 mg | ORAL_TABLET | Freq: Two times a day (BID) | ORAL | Status: DC

## 2014-02-28 NOTE — Progress Notes (Signed)
Subjective:    Patient ID: Joanna [name], female    DOB: [birth year], 80 y.o.   MRN: [id number]  Diabetes She presents for her follow-up diabetic visit. She has type 2 diabetes mellitus. Her disease course has been stable. There are no hypoglycemic associated symptoms. Pertinent negatives for diabetes include no blurred vision, no chest pain, no fatigue, no foot paresthesias, no foot ulcerations, no polydipsia, no polyphagia, no polyuria, no visual change, no weakness and no weight loss. There are no hypoglycemic complications. Symptoms are stable. Diabetic complications include a CVA, heart disease and nephropathy. Current diabetic treatment includes oral agent (dual therapy). She is compliant with treatment all of the time. Her weight is stable. She is following a generally healthy diet. Meal planning includes avoidance of concentrated sweets. She participates in exercise intermittently. There is no change in her home blood glucose trend. An ACE inhibitor/angiotensin II receptor blocker is being taken. She does not see a podiatrist.Eye exam is current.      Review of Systems  Constitutional: Negative.  Negative for chills, weight loss, diaphoresis, appetite change and fatigue.  HENT: Negative.   Eyes: Negative.  Negative for blurred vision.  Respiratory: Negative.  Negative for cough, choking, chest tightness, shortness of breath and stridor.   Cardiovascular: Negative.  Negative for chest pain and leg swelling.  Gastrointestinal: Negative.  Negative for nausea, vomiting, abdominal pain, diarrhea, constipation and blood in stool.  Endocrine: Negative.  Negative for polydipsia, polyphagia and polyuria.  Genitourinary: Negative.   Musculoskeletal: Negative.  Negative for arthralgias, back pain, gait problem, myalgias and neck pain.  Skin: Negative.   Allergic/Immunologic: Negative.   Neurological: Negative.  Negative for weakness.  Hematological: Negative.  Negative for adenopathy. Does not  bruise/bleed easily.  Psychiatric/Behavioral: Negative.        Objective:   Physical Exam  Vitals reviewed. Constitutional: She is oriented to person, place, and time. She appears well-developed and well-nourished. No distress.  HENT:  Head: Normocephalic and atraumatic.  Mouth/Throat: Oropharynx is clear and moist. No oropharyngeal exudate.  Eyes: Conjunctivae are normal. Right eye exhibits no discharge. Left eye exhibits no discharge. No scleral icterus.  Neck: Normal range of motion. Neck supple. No JVD present. No tracheal deviation present. No thyromegaly present.  Cardiovascular: Normal rate, regular rhythm and intact distal pulses.  Exam reveals no gallop and no friction rub.   No murmur heard. Pulses:      Carotid pulses are 1+ on the right side, and 1+ on the left side.      Radial pulses are 1+ on the right side, and 1+ on the left side.       Femoral pulses are 1+ on the right side, and 1+ on the left side.      Popliteal pulses are 1+ on the right side, and 1+ on the left side.       Dorsalis pedis pulses are 1+ on the right side, and 1+ on the left side.       Posterior tibial pulses are 1+ on the right side, and 1+ on the left side.  +click in mid-systole  Pulmonary/Chest: Effort normal and breath sounds normal. No stridor. No respiratory distress. She has no wheezes. She has no rales. She exhibits no tenderness.  Abdominal: Soft. Bowel sounds are normal. She exhibits no distension and no mass. There is no tenderness. There is no rebound and no guarding.  Musculoskeletal: Normal range of motion. She exhibits no edema and no tenderness.  Lymphadenopathy:    She has no cervical adenopathy.  Neurological: She is oriented to person, place, and time.  Skin: Skin is warm and dry. No rash noted. She is not diaphoretic. No erythema. No pallor.  Psychiatric: She has a normal mood and affect. Her behavior is normal. Judgment and thought content normal.     Lab Results    Component Value Date   WBC 8.6 06/22/2013   HGB 9.8* 11/28/2013   HCT 30.3* 11/28/2013   PLT 201 06/22/2013   GLUCOSE 121* 02/06/2014   CHOL 212* 11/28/2013   TRIG 135.0 11/28/2013   HDL 69.30 11/28/2013   LDLDIRECT 120.6 11/28/2013   LDLCALC 75 11/15/2010   ALT 12 11/28/2013   AST 19 11/28/2013   NA 144 02/06/2014   K 4.9 02/06/2014   CL 103 02/06/2014   CREATININE 1.25* 02/06/2014   BUN 59* 02/06/2014   CO2 28 02/06/2014   TSH 1.427 05/10/2013   INR 2.2 02/14/2014   HGBA1C 6.2 11/28/2013       Assessment & Plan:

## 2014-02-28 NOTE — Patient Instructions (Signed)
Type 2 Diabetes Mellitus, Adult Type 2 diabetes mellitus, often simply referred to as type 2 diabetes, is a long-lasting (chronic) disease. In type 2 diabetes, the pancreas does not make enough insulin (a hormone), the cells are less responsive to the insulin that is made (insulin resistance), or both. Normally, insulin moves sugars from food into the tissue cells. The tissue cells use the sugars for energy. The lack of insulin or the lack of normal response to insulin causes excess sugars to build up in the blood instead of going into the tissue cells. As a result, high blood sugar (hyperglycemia) develops. The effect of high sugar (glucose) levels can cause many complications. Type 2 diabetes was also previously called adult-onset diabetes but it can occur at any age.  RISK FACTORS  A person is predisposed to developing type 2 diabetes if someone in the family has the disease and also has one or more of the following primary risk factors:  Overweight.  An inactive lifestyle.  A history of consistently eating high-calorie foods. Maintaining a normal weight and regular physical activity can reduce the chance of developing type 2 diabetes. SYMPTOMS  A person with type 2 diabetes may not show symptoms initially. The symptoms of type 2 diabetes appear slowly. The symptoms include:  Increased thirst (polydipsia).  Increased urination (polyuria).  Increased urination during the night (nocturia).  Weight loss. This weight loss may be rapid.  Frequent, recurring infections.  Tiredness (fatigue).  Weakness.  Vision changes, such as blurred vision.  Fruity smell to your breath.  Abdominal pain.  Nausea or vomiting.  Cuts or bruises which are slow to heal.  Tingling or numbness in the hands or feet. DIAGNOSIS Type 2 diabetes is frequently not diagnosed until complications of diabetes are present. Type 2 diabetes is diagnosed when symptoms or complications are present and when blood  glucose levels are increased. Your blood glucose level may be checked by one or more of the following blood tests:  A fasting blood glucose test. You will not be allowed to eat for at least 8 hours before a blood sample is taken.  A random blood glucose test. Your blood glucose is checked at any time of the day regardless of when you ate.  A hemoglobin A1c blood glucose test. A hemoglobin A1c test provides information about blood glucose control over the previous 3 months.  An oral glucose tolerance test (OGTT). Your blood glucose is measured after you have not eaten (fasted) for 2 hours and then after you drink a glucose-containing beverage. TREATMENT   You may need to take insulin or diabetes medicine daily to keep blood glucose levels in the desired range.  You will need to match insulin dosing with exercise and healthy food choices. The treatment goal is to maintain the before meal blood sugar (preprandial glucose) level at 70 130 mg/dL. HOME CARE INSTRUCTIONS   Have your hemoglobin A1c level checked twice a year.  Perform daily blood glucose monitoring as directed by your caregiver.  Monitor urine ketones when you are ill and as directed by your caregiver.  Take your diabetes medicine or insulin as directed by your caregiver to maintain your blood glucose levels in the desired range.  Never run out of diabetes medicine or insulin. It is needed every day.  Adjust insulin based on your intake of carbohydrates. Carbohydrates can raise blood glucose levels but need to be included in your diet. Carbohydrates provide vitamins, minerals, and fiber which are an essential part of   a healthy diet. Carbohydrates are found in fruits, vegetables, whole grains, dairy products, legumes, and foods containing added sugars.    Eat healthy foods. Alternate 3 meals with 3 snacks.  Lose weight if overweight.  Carry a medical alert card or wear your medical alert jewelry.  Carry a 15 gram  carbohydrate snack with you at all times to treat low blood glucose (hypoglycemia). Some examples of 15 gram carbohydrate snacks include:  Glucose tablets, 3 or 4   Glucose gel, 15 gram tube  Raisins, 2 tablespoons (24 grams)  Jelly beans, 6  Animal crackers, 8  Regular pop, 4 ounces (120 mL)  Gummy treats, 9  Recognize hypoglycemia. Hypoglycemia occurs with blood glucose levels of 70 mg/dL and below. The risk for hypoglycemia increases when fasting or skipping meals, during or after intense exercise, and during sleep. Hypoglycemia symptoms can include:  Tremors or shakes.  Decreased ability to concentrate.  Sweating.  Increased heart rate.  Headache.  Dry mouth.  Hunger.  Irritability.  Anxiety.  Restless sleep.  Altered speech or coordination.  Confusion.  Treat hypoglycemia promptly. If you are alert and able to safely swallow, follow the 15:15 rule:  Take 15 20 grams of rapid-acting glucose or carbohydrate. Rapid-acting options include glucose gel, glucose tablets, or 4 ounces (120 mL) of fruit juice, regular soda, or low fat milk.  Check your blood glucose level 15 minutes after taking the glucose.  Take 15 20 grams more of glucose if the repeat blood glucose level is still 70 mg/dL or below.  Eat a meal or snack within 1 hour once blood glucose levels return to normal.    Be alert to polyuria and polydipsia which are early signs of hyperglycemia. An early awareness of hyperglycemia allows for prompt treatment. Treat hyperglycemia as directed by your caregiver.  Engage in at least 150 minutes of moderate-intensity physical activity a week, spread over at least 3 days of the week or as directed by your caregiver. In addition, you should engage in resistance exercise at least 2 times a week or as directed by your caregiver.  Adjust your medicine and food intake as needed if you start a new exercise or sport.  Follow your sick day plan at any time you  are unable to eat or drink as usual.  Avoid tobacco use.  Limit alcohol intake to no more than 1 drink per day for nonpregnant women and 2 drinks per day for men. You should drink alcohol only when you are also eating food. Talk with your caregiver whether alcohol is safe for you. Tell your caregiver if you drink alcohol several times a week.  Follow up with your caregiver regularly.  Schedule an eye exam soon after the diagnosis of type 2 diabetes and then annually.  Perform daily skin and foot care. Examine your skin and feet daily for cuts, bruises, redness, nail problems, bleeding, blisters, or sores. A foot exam by a caregiver should be done annually.  Brush your teeth and gums at least twice a day and floss at least once a day. Follow up with your dentist regularly.  Share your diabetes management plan with your workplace or school.  Stay up-to-date with immunizations.  Learn to manage stress.  Obtain ongoing diabetes education and support as needed.  Participate in, or seek rehabilitation as needed to maintain or improve independence and quality of life. Request a physical or occupational therapy referral if you are having foot or hand numbness or difficulties with grooming,   dressing, eating, or physical activity. SEEK MEDICAL CARE IF:   You are unable to eat food or drink fluids for more than 6 hours.  You have nausea and vomiting for more than 6 hours.  Your blood glucose level is over 240 mg/dL.  There is a change in mental status.  You develop an additional serious illness.  You have diarrhea for more than 6 hours.  You have been sick or have had a fever for a couple of days and are not getting better.  You have pain during any physical activity.  SEEK IMMEDIATE MEDICAL CARE IF:  You have difficulty breathing.  You have moderate to large ketone levels. MAKE SURE YOU:  Understand these instructions.  Will watch your condition.  Will get help right away if  you are not doing well or get worse. Document Released: 10/06/2005 Document Revised: 06/30/2012 Document Reviewed: 05/04/2012 [employer] Patient Information 2014 [employer].  

## 2014-02-28 NOTE — Assessment & Plan Note (Signed)
I have asked her to restart the iron replacement therapy

## 2014-02-28 NOTE — Assessment & Plan Note (Signed)
Her BP is well controlled 

## 2014-02-28 NOTE — Assessment & Plan Note (Signed)
The A1C shows that her blood sugars are well controlled She has mild but stable renal insufficiency, will monitor this closely in light of the metformin therapy but for now she is tolerating it well

## 2014-03-02 ENCOUNTER — Ambulatory Visit (INDEPENDENT_AMBULATORY_CARE_PROVIDER_SITE_OTHER): Payer: Medicare Other | Admitting: Pharmacist Clinician (PhC)/ Clinical Pharmacy Specialist

## 2014-03-02 DIAGNOSIS — I6350 Cerebral infarction due to unspecified occlusion or stenosis of unspecified cerebral artery: Secondary | ICD-10-CM

## 2014-03-02 DIAGNOSIS — I4891 Unspecified atrial fibrillation: Secondary | ICD-10-CM

## 2014-03-02 DIAGNOSIS — Z953 Presence of xenogenic heart valve: Secondary | ICD-10-CM

## 2014-03-02 DIAGNOSIS — Z952 Presence of prosthetic heart valve: Secondary | ICD-10-CM

## 2014-03-02 LAB — POCT INR: INR: 3.6

## 2014-03-17 ENCOUNTER — Ambulatory Visit (INDEPENDENT_AMBULATORY_CARE_PROVIDER_SITE_OTHER): Payer: Medicare Other | Admitting: Surgery

## 2014-03-17 DIAGNOSIS — I4891 Unspecified atrial fibrillation: Secondary | ICD-10-CM

## 2014-03-17 DIAGNOSIS — Z952 Presence of prosthetic heart valve: Secondary | ICD-10-CM

## 2014-03-17 DIAGNOSIS — Z953 Presence of xenogenic heart valve: Secondary | ICD-10-CM

## 2014-03-17 LAB — POCT INR: INR: 2

## 2014-03-21 ENCOUNTER — Ambulatory Visit (INDEPENDENT_AMBULATORY_CARE_PROVIDER_SITE_OTHER): Payer: Medicare Other

## 2014-03-21 VITALS — BP 127/72 | HR 94 | Resp 15 | Ht 63.0 in | Wt 127.0 lb

## 2014-03-21 DIAGNOSIS — E1142 Type 2 diabetes mellitus with diabetic polyneuropathy: Secondary | ICD-10-CM

## 2014-03-21 DIAGNOSIS — Q828 Other specified congenital malformations of skin: Secondary | ICD-10-CM

## 2014-03-21 DIAGNOSIS — L608 Other nail disorders: Secondary | ICD-10-CM

## 2014-03-21 DIAGNOSIS — E1140 Type 2 diabetes mellitus with diabetic neuropathy, unspecified: Secondary | ICD-10-CM

## 2014-03-21 DIAGNOSIS — E1149 Type 2 diabetes mellitus with other diabetic neurological complication: Secondary | ICD-10-CM

## 2014-03-21 NOTE — Progress Notes (Signed)
   Subjective:    Patient ID: Joanna [name], female    DOB: 1929-12-03, 80 y.o.   MRN: [id number]  HPI Comments: Pt states she need her toenails 1-10.     Review of Systems no new findings or systemic changes noted     Objective:   Physical Exam Neurovascular status is intact with pedal pulses palpable DP plus one over 4 PT thready one over 4 bilateral. There is a +1 edema both lower extremities no ascending psoas lymphangitis no signs of infection no open wounds or ulcerations noted neurologically epicritic and proprioceptive sensations appear to be intact and symmetric bilateral decreased sensation Semmes Weinstein testing to forefoot plantar arch and digits dermatologically nails thick brittle crumbly friable discolored and criptotic incurvated nails 1 through 5 bilateral no other new findings noted no ulcers no keratoses there is mild digital contractures noted consistent with hammertoe deformity       Assessment & Plan:  Assessment this time diabetes with peripheral neuropathy and complications patient has thick brittle crumbly dystrophic probably orthotic nails debrided 1 through 5 bilateral this time Joanna [name] for future diabetic foot and palliative nail care and as-needed basis suggest 3 month followup for nail care  Alvan Dame DPM

## 2014-03-21 NOTE — Patient Instructions (Signed)
Diabetes and Foot Care Diabetes may cause you to have problems because of poor blood supply (circulation) to your feet and legs. This may cause the skin on your feet to become thinner, break easier, and heal more slowly. Your skin may become dry, and the skin may peel and crack. You may also have nerve damage in your legs and feet causing decreased feeling in them. You may not notice minor injuries to your feet that could lead to infections or more serious problems. Taking care of your feet is one of the most important things you can do for yourself.  HOME CARE INSTRUCTIONS  Wear shoes at all times, even in the house. Do not go barefoot. Bare feet are easily injured.  Check your feet daily for blisters, cuts, and redness. If you cannot see the bottom of your feet, use a mirror or ask someone for help.  Wash your feet with warm water (do not use hot water) and mild soap. Then pat your feet and the areas between your toes until they are completely dry. Do not soak your feet as this can dry your skin.  Apply a moisturizing lotion or petroleum jelly (that does not contain alcohol and is unscented) to the skin on your feet and to dry, brittle toenails. Do not apply lotion between your toes.  Trim your toenails straight across. Do not dig under them or around the cuticle. File the edges of your nails with an emery board or nail file.  Do not cut corns or calluses or try to remove them with medicine.  Wear clean socks or stockings every day. Make sure they are not too tight. Do not wear knee-high stockings since they may decrease blood flow to your legs.  Wear shoes that fit properly and have enough cushioning. To break in new shoes, wear them for just a few hours a day. This prevents you from injuring your feet. Always look in your shoes before you put them on to be sure there are no objects inside.  Do not cross your legs. This may decrease the blood flow to your feet.  If you find a minor scrape,  cut, or break in the skin on your feet, keep it and the skin around it clean and dry. These areas may be cleansed with mild soap and water. Do not cleanse the area with peroxide, alcohol, or iodine.  When you remove an adhesive bandage, be sure not to damage the skin around it.  If you have a wound, look at it several times a day to make sure it is healing.  Do not use heating pads or hot water bottles. They may burn your skin. If you have lost feeling in your feet or legs, you may not know it is happening until it is too late.  Make sure your health care provider performs a complete foot exam at least annually or more often if you have foot problems. Report any cuts, sores, or bruises to your health care provider immediately. SEEK MEDICAL CARE IF:   You have an injury that is not healing.  You have cuts or breaks in the skin.  You have an ingrown nail.  You notice redness on your legs or feet.  You feel burning or tingling in your legs or feet.  You have pain or cramps in your legs and feet.  Your legs or feet are numb.  Your feet always feel cold. SEEK IMMEDIATE MEDICAL CARE IF:   There is increasing redness,   swelling, or pain in or around a wound.  There is a red line that goes up your leg.  Pus is coming from a wound.  You develop a fever or as directed by your health care provider.  You notice a bad smell coming from an ulcer or wound. Document Released: 10/03/2000 Document Revised: 06/08/2013 Document Reviewed: 03/15/2013 [employer] Patient Information 2014 [employer].  

## 2014-03-31 ENCOUNTER — Ambulatory Visit (INDEPENDENT_AMBULATORY_CARE_PROVIDER_SITE_OTHER): Payer: Medicare Other

## 2014-03-31 DIAGNOSIS — I4891 Unspecified atrial fibrillation: Secondary | ICD-10-CM

## 2014-03-31 DIAGNOSIS — Z952 Presence of prosthetic heart valve: Secondary | ICD-10-CM

## 2014-03-31 DIAGNOSIS — I6350 Cerebral infarction due to unspecified occlusion or stenosis of unspecified cerebral artery: Secondary | ICD-10-CM

## 2014-03-31 DIAGNOSIS — Z953 Presence of xenogenic heart valve: Secondary | ICD-10-CM

## 2014-03-31 LAB — POCT INR: INR: 2

## 2014-04-20 ENCOUNTER — Ambulatory Visit (INDEPENDENT_AMBULATORY_CARE_PROVIDER_SITE_OTHER): Payer: Medicare Other | Admitting: Pharmacist Clinician (PhC)/ Clinical Pharmacy Specialist

## 2014-04-20 DIAGNOSIS — I4891 Unspecified atrial fibrillation: Secondary | ICD-10-CM

## 2014-04-20 DIAGNOSIS — Z953 Presence of xenogenic heart valve: Secondary | ICD-10-CM

## 2014-04-20 DIAGNOSIS — I6350 Cerebral infarction due to unspecified occlusion or stenosis of unspecified cerebral artery: Secondary | ICD-10-CM

## 2014-04-20 DIAGNOSIS — Z952 Presence of prosthetic heart valve: Secondary | ICD-10-CM

## 2014-04-20 LAB — POCT INR: INR: 1.4

## 2014-04-22 ENCOUNTER — Other Ambulatory Visit: Payer: Self-pay | Admitting: Cardiology

## 2014-05-03 ENCOUNTER — Other Ambulatory Visit: Payer: Self-pay | Admitting: Internal Medicine

## 2014-05-04 ENCOUNTER — Ambulatory Visit (INDEPENDENT_AMBULATORY_CARE_PROVIDER_SITE_OTHER): Payer: Medicare Other | Admitting: *Deleted

## 2014-05-04 DIAGNOSIS — I4891 Unspecified atrial fibrillation: Secondary | ICD-10-CM

## 2014-05-04 DIAGNOSIS — Z952 Presence of prosthetic heart valve: Secondary | ICD-10-CM

## 2014-05-04 DIAGNOSIS — Z5181 Encounter for therapeutic drug level monitoring: Secondary | ICD-10-CM

## 2014-05-04 DIAGNOSIS — Z953 Presence of xenogenic heart valve: Secondary | ICD-10-CM

## 2014-05-04 DIAGNOSIS — I6350 Cerebral infarction due to unspecified occlusion or stenosis of unspecified cerebral artery: Secondary | ICD-10-CM

## 2014-05-04 LAB — POCT INR: INR: 3.6

## 2014-05-18 LAB — HM DIABETES EYE EXAM

## 2014-05-19 ENCOUNTER — Encounter (HOSPITAL_COMMUNITY): Payer: Self-pay | Admitting: Vascular Surgery

## 2014-05-19 ENCOUNTER — Ambulatory Visit (INDEPENDENT_AMBULATORY_CARE_PROVIDER_SITE_OTHER): Payer: Medicare Other | Admitting: Pharmacist

## 2014-05-19 DIAGNOSIS — I4891 Unspecified atrial fibrillation: Secondary | ICD-10-CM

## 2014-05-19 DIAGNOSIS — Z953 Presence of xenogenic heart valve: Secondary | ICD-10-CM

## 2014-05-19 DIAGNOSIS — I6350 Cerebral infarction due to unspecified occlusion or stenosis of unspecified cerebral artery: Secondary | ICD-10-CM

## 2014-05-19 DIAGNOSIS — Z952 Presence of prosthetic heart valve: Secondary | ICD-10-CM

## 2014-05-19 DIAGNOSIS — Z5181 Encounter for therapeutic drug level monitoring: Secondary | ICD-10-CM

## 2014-05-19 LAB — POCT INR: INR: 2.1

## 2014-05-24 ENCOUNTER — Encounter: Payer: Self-pay | Admitting: Internal Medicine

## 2014-06-09 ENCOUNTER — Ambulatory Visit (INDEPENDENT_AMBULATORY_CARE_PROVIDER_SITE_OTHER): Payer: Medicare Other

## 2014-06-09 DIAGNOSIS — I6350 Cerebral infarction due to unspecified occlusion or stenosis of unspecified cerebral artery: Secondary | ICD-10-CM

## 2014-06-09 DIAGNOSIS — Z952 Presence of prosthetic heart valve: Secondary | ICD-10-CM

## 2014-06-09 DIAGNOSIS — I4891 Unspecified atrial fibrillation: Secondary | ICD-10-CM

## 2014-06-09 DIAGNOSIS — Z5181 Encounter for therapeutic drug level monitoring: Secondary | ICD-10-CM

## 2014-06-09 DIAGNOSIS — Z953 Presence of xenogenic heart valve: Secondary | ICD-10-CM

## 2014-06-09 LAB — POCT INR: INR: 2.6

## 2014-06-12 ENCOUNTER — Encounter: Payer: Self-pay | Admitting: Thoracic Surgery (Cardiothoracic Vascular Surgery)

## 2014-06-12 ENCOUNTER — Ambulatory Visit (INDEPENDENT_AMBULATORY_CARE_PROVIDER_SITE_OTHER): Payer: Medicare Other | Admitting: Thoracic Surgery (Cardiothoracic Vascular Surgery)

## 2014-06-12 VITALS — BP 126/74 | HR 88 | Resp 20 | Ht 63.0 in | Wt 127.0 lb

## 2014-06-12 DIAGNOSIS — I059 Rheumatic mitral valve disease, unspecified: Secondary | ICD-10-CM

## 2014-06-12 DIAGNOSIS — I509 Heart failure, unspecified: Secondary | ICD-10-CM

## 2014-06-12 DIAGNOSIS — Z952 Presence of prosthetic heart valve: Secondary | ICD-10-CM

## 2014-06-12 DIAGNOSIS — I350 Nonrheumatic aortic (valve) stenosis: Secondary | ICD-10-CM

## 2014-06-12 DIAGNOSIS — Z953 Presence of xenogenic heart valve: Secondary | ICD-10-CM

## 2014-06-12 DIAGNOSIS — I340 Nonrheumatic mitral (valve) insufficiency: Secondary | ICD-10-CM

## 2014-06-12 DIAGNOSIS — I6529 Occlusion and stenosis of unspecified carotid artery: Secondary | ICD-10-CM

## 2014-06-12 DIAGNOSIS — I5032 Chronic diastolic (congestive) heart failure: Secondary | ICD-10-CM

## 2014-06-12 DIAGNOSIS — I359 Nonrheumatic aortic valve disorder, unspecified: Secondary | ICD-10-CM

## 2014-06-12 NOTE — Progress Notes (Signed)
[address]       [name] [address]             [phone]     CARDIOTHORACIC SURGERY OFFICE NOTE  Referring Provider is Laurey Morale, MD PCP is Sanda Linger, MD   HPI:  Patient returns for followup status post aortic and mitral valve replacement using a bioprosthetic tissue valves on 06/15/2013.  She was last seen here in our office on 11/14/2013.  She continues to followup intermittently in the advanced heart failure clinic, where she was seen most recently on 02/06/2014. She is now approximately one year out from her surgery, and clinically she is doing exceptionally well. She describes stable symptoms of exertional shortness of breath that only occur with moderately strenuous activity. This does not seem to limit her daily physical activities. She remains functionally independent. She is driving a car and tending to her basic daily needs. She has not had any particular problems managing Coumadin therapy, although she states that her ophthalmologist is told her that she has had signs of some problems in her eyes related to anticoagulation.  She has not had any palpitations or other clinical signs of recurrent atrial fibrillation, and she underwent and event monitor last January that revealed only sinus rhythm with occasional PVCs. Overall she is delighted with her progress and she states that she feels dramatically better than she did prior to surgery.    Current Outpatient Prescriptions  Medication Sig Dispense Refill  . amLODipine (NORVASC) 5 MG tablet Take 1 tablet by mouth  daily  90 tablet  3  . calcium citrate-vitamin D (CITRACAL+D) 315-200 MG-UNIT per tablet Take 1 tablet by mouth daily.      . carvedilol (COREG) 6.25 MG tablet TAKE 1 TABLET BY MOUTH TWICE A DAY WITH MEALS  180 tablet  3  . docusate sodium (COLACE) 100 MG capsule Take 100 mg by mouth 2 (two) times daily.        . ferrous sulfate 325 (65 FE) MG tablet Take 1 tablet (325 mg total) by  mouth 2 (two) times daily with a meal.  180 tablet  1  . furosemide (LASIX) 40 MG tablet Take 1 tablet (40 mg total) by mouth 2 (two) times daily.  180 tablet  3  . losartan (COZAAR) 100 MG tablet Take 1 tablet by mouth  daily  90 tablet  3  . metFORMIN (GLUCOPHAGE) 1000 MG tablet Take 1 tablet by mouth  twice a day with meals  180 tablet  3  . metolazone (ZAROXOLYN) 2.5 MG tablet Take 1 tablet (2.5 mg total) by mouth once a week. Every Monday  20 tablet  3  . Potassium Chloride ER 20 MEQ TBCR Take 20 meq (1 tablet) twice a day. On Mondays take an extra 20 meq of potassium.      . simvastatin (ZOCOR) 20 MG tablet Take 1 tablet (20 mg total) by mouth every evening.  90 tablet  3  . sitaGLIPtin (JANUVIA) 50 MG tablet Take 1 tablet (50 mg total) by mouth daily.  90 tablet  3  . warfarin (COUMADIN) 4 MG tablet 2 tablets daily except 1.5 tablets each Sunday or as directed by coumadin clinic  70 tablet  3   No current facility-administered medications for this visit.      Physical Exam:   BP 126/74  Pulse 88  Resp 20  Ht 5\' 3"  (1.6 m)  Wt 127 lb (57.607 kg)  BMI 22.50 kg/m2  SpO2 98%  General:  Well-appearing  Chest:   Clear to auscultation  CV:   Regular rate and rhythm without murmur  Incisions:  Completely healed  Abdomen:  Soft nontender  Extremities:  Warm and well-perfused  Diagnostic Tests:  2 channel telemetry rhythm strip demonstrates normal sinus rhythm   Impression:  Patient is doing very well proximally one years status post aortic and mitral valve replacement using a bioprosthetic tissue valves. Late followup echocardiogram performed October of 2014 demonstrated normal function of both bioprosthetic tissue valves and some improvement in left ventricular systolic function. She has been maintaining sinus rhythm and followup event monitor did not reveal any signs of recurrent atrial fibrillation.  Plan:  The patient has been advised to discuss the possibility of stopping  Coumadin therapy with Dr. Gala Romney when she is seen for followup as previously scheduled within the next few weeks. I think it would be reasonable to consider stopping Coumadin, but I would defer to his decision.  In the future she will call and return to see [place] here as needed. She has been reminded regarding her life long need for antibiotic prophylaxis for all dental cleaning and related procedures. All of her questions have been addressed.  I spent in excess of 15 minutes during the conduct of this office consultation and >50% of this time involved direct face-to-face encounter with the patient for counseling and/or coordination of their care.   Salvatore Decent. Cornelius Moras, MD 06/12/2014 5:23 PM

## 2014-06-12 NOTE — Patient Instructions (Signed)

## 2014-06-27 ENCOUNTER — Telehealth: Payer: Self-pay | Admitting: *Deleted

## 2014-06-27 NOTE — Telephone Encounter (Signed)
There is no generic for this but samples are available

## 2014-06-27 NOTE — Telephone Encounter (Signed)
Called pt no answer LMOM RTC.../lmb 

## 2014-06-27 NOTE — Telephone Encounter (Signed)
Left msg on triage stating she is in the doughnut hole wanting to see if md rx low cost to replace the [place]...Raechel Chute

## 2014-06-28 NOTE — Telephone Encounter (Signed)
Called pt spoke with daughter [name]) gave her md response...Raechel Chute

## 2014-07-07 ENCOUNTER — Ambulatory Visit (INDEPENDENT_AMBULATORY_CARE_PROVIDER_SITE_OTHER): Payer: Medicare Other | Admitting: *Deleted

## 2014-07-07 DIAGNOSIS — Z5181 Encounter for therapeutic drug level monitoring: Secondary | ICD-10-CM

## 2014-07-07 DIAGNOSIS — Z952 Presence of prosthetic heart valve: Secondary | ICD-10-CM

## 2014-07-07 DIAGNOSIS — I4891 Unspecified atrial fibrillation: Secondary | ICD-10-CM

## 2014-07-07 DIAGNOSIS — I6350 Cerebral infarction due to unspecified occlusion or stenosis of unspecified cerebral artery: Secondary | ICD-10-CM

## 2014-07-07 DIAGNOSIS — Z953 Presence of xenogenic heart valve: Secondary | ICD-10-CM

## 2014-07-07 LAB — POCT INR: INR: 3.3

## 2014-07-10 ENCOUNTER — Ambulatory Visit (HOSPITAL_COMMUNITY)
Admission: RE | Admit: 2014-07-10 | Discharge: 2014-07-10 | Disposition: A | Discharge status: Home / Self Care | Payer: Medicare Other | Source: Ambulatory Visit | Attending: Internal Medicine | Admitting: Internal Medicine

## 2014-07-10 ENCOUNTER — Encounter (HOSPITAL_COMMUNITY): Payer: Self-pay

## 2014-07-10 VITALS — BP 120/52 | HR 97 | Wt 130.8 lb

## 2014-07-10 DIAGNOSIS — R5381 Other malaise: Secondary | ICD-10-CM | POA: Diagnosis not present

## 2014-07-10 DIAGNOSIS — I6529 Occlusion and stenosis of unspecified carotid artery: Secondary | ICD-10-CM | POA: Insufficient documentation

## 2014-07-10 DIAGNOSIS — E783 Hyperchylomicronemia: Secondary | ICD-10-CM | POA: Diagnosis not present

## 2014-07-10 DIAGNOSIS — I4891 Unspecified atrial fibrillation: Secondary | ICD-10-CM | POA: Diagnosis not present

## 2014-07-10 DIAGNOSIS — E785 Hyperlipidemia, unspecified: Secondary | ICD-10-CM | POA: Insufficient documentation

## 2014-07-10 DIAGNOSIS — Z79899 Other long term (current) drug therapy: Secondary | ICD-10-CM | POA: Diagnosis not present

## 2014-07-10 DIAGNOSIS — I5032 Chronic diastolic (congestive) heart failure: Secondary | ICD-10-CM

## 2014-07-10 DIAGNOSIS — E119 Type 2 diabetes mellitus without complications: Secondary | ICD-10-CM | POA: Insufficient documentation

## 2014-07-10 DIAGNOSIS — I480 Paroxysmal atrial fibrillation: Secondary | ICD-10-CM

## 2014-07-10 DIAGNOSIS — I509 Heart failure, unspecified: Secondary | ICD-10-CM | POA: Diagnosis not present

## 2014-07-10 DIAGNOSIS — R0602 Shortness of breath: Secondary | ICD-10-CM | POA: Insufficient documentation

## 2014-07-10 DIAGNOSIS — K5900 Constipation, unspecified: Secondary | ICD-10-CM | POA: Insufficient documentation

## 2014-07-10 DIAGNOSIS — I129 Hypertensive chronic kidney disease with stage 1 through stage 4 chronic kidney disease, or unspecified chronic kidney disease: Secondary | ICD-10-CM | POA: Diagnosis not present

## 2014-07-10 DIAGNOSIS — M129 Arthropathy, unspecified: Secondary | ICD-10-CM | POA: Insufficient documentation

## 2014-07-10 DIAGNOSIS — I080 Rheumatic disorders of both mitral and aortic valves: Secondary | ICD-10-CM | POA: Insufficient documentation

## 2014-07-10 DIAGNOSIS — N189 Chronic kidney disease, unspecified: Secondary | ICD-10-CM | POA: Diagnosis not present

## 2014-07-10 DIAGNOSIS — D509 Iron deficiency anemia, unspecified: Secondary | ICD-10-CM | POA: Insufficient documentation

## 2014-07-10 DIAGNOSIS — I519 Heart disease, unspecified: Secondary | ICD-10-CM | POA: Diagnosis not present

## 2014-07-10 DIAGNOSIS — Z8673 Personal history of transient ischemic attack (TIA), and cerebral infarction without residual deficits: Secondary | ICD-10-CM | POA: Insufficient documentation

## 2014-07-10 DIAGNOSIS — N183 Chronic kidney disease, stage 3 unspecified: Secondary | ICD-10-CM

## 2014-07-10 DIAGNOSIS — Z7901 Long term (current) use of anticoagulants: Secondary | ICD-10-CM | POA: Diagnosis not present

## 2014-07-10 DIAGNOSIS — Z954 Presence of other heart-valve replacement: Secondary | ICD-10-CM | POA: Diagnosis not present

## 2014-07-10 DIAGNOSIS — I739 Peripheral vascular disease, unspecified: Secondary | ICD-10-CM | POA: Diagnosis not present

## 2014-07-10 DIAGNOSIS — Z9889 Other specified postprocedural states: Secondary | ICD-10-CM | POA: Diagnosis not present

## 2014-07-10 DIAGNOSIS — R42 Dizziness and giddiness: Secondary | ICD-10-CM | POA: Diagnosis not present

## 2014-07-10 DIAGNOSIS — I447 Left bundle-branch block, unspecified: Secondary | ICD-10-CM | POA: Diagnosis not present

## 2014-07-10 DIAGNOSIS — R5383 Other fatigue: Secondary | ICD-10-CM

## 2014-07-10 DIAGNOSIS — B029 Zoster without complications: Secondary | ICD-10-CM | POA: Insufficient documentation

## 2014-07-10 DIAGNOSIS — R351 Nocturia: Secondary | ICD-10-CM | POA: Diagnosis not present

## 2014-07-10 DIAGNOSIS — Z952 Presence of prosthetic heart valve: Secondary | ICD-10-CM

## 2014-07-10 DIAGNOSIS — Z953 Presence of xenogenic heart valve: Secondary | ICD-10-CM

## 2014-07-10 LAB — BASIC METABOLIC PANEL
Anion gap: 14 (ref 5–15)
BUN: 56 mg/dL — AB (ref 6–23)
CO2: 27 mEq/L (ref 19–32)
Calcium: 10.3 mg/dL (ref 8.4–10.5)
Chloride: 100 mEq/L (ref 96–112)
Creatinine, Ser: 1.62 mg/dL — ABNORMAL HIGH (ref 0.50–1.10)
GFR, EST AFRICAN AMERICAN: 33 mL/min — AB (ref >90)
GFR, EST NON AFRICAN AMERICAN: 29 mL/min — AB (ref >90)
Glucose, Bld: 192 mg/dL — ABNORMAL HIGH (ref 70–99)
POTASSIUM: 5.9 meq/L — AB (ref 3.7–5.3)
Sodium: 141 mEq/L (ref 137–147)

## 2014-07-10 MED ORDER — ASPIRIN EC 81 MG PO TBEC: 81.0000 mg | DELAYED_RELEASE_TABLET | Freq: Every day | ORAL | Status: DC

## 2014-07-10 NOTE — Addendum Note (Signed)
Encounter addended by: Noralee Space, RN on: 07/10/2014  3:01 PM<BR>     Documentation filed: Medications, Patient Instructions Section, Orders

## 2014-07-10 NOTE — Patient Instructions (Signed)
Stop Warfarin  Start Aspirin 81 mg daily  Lab today  Your physician has requested that you have an echocardiogram. Echocardiography is a painless test that uses sound waves to create images of your heart. It provides your doctor with information about the size and shape of your heart and how well your heart's chambers and valves are working. This procedure takes approximately one hour. There are no restrictions for this procedure.  Take Metolazone as needed  Your physician recommends that you schedule a follow-up appointment in: 4 months

## 2014-07-10 NOTE — Progress Notes (Signed)
Patient ID: [name], female   DOB: 1930/04/07, 80 y.o.   MRN: [id number]  PCP: Dr Yetta Barre ([facility] [address]) CVTS Surgeon: Dr. Cornelius Moras Primary Cardiologist: Dr. Gala Romney  HPI: [name] is a 80 year old woman with aortic stenosis s/p AVR/mitral valve replacement (06/15/2013), systolic HF due to NICM, hypertension, diabetes mellitus type II, hyperlipidemia, and peripheral artery disease. Underwent AVR/MVR with placement of epicardial lead on 06/15/13 with PAF post-op.   Admitted initially in 09/2011 with acute systolic HF, EF 30%. Cath with normal coronaries and normal cardiac output (see below). AS was mild to moderate. In hospital also found to be iron-deficiency anemia. Previous colonoscopies normal. Seen by GI who suggested trial of iron and see if it improved.  05/19/2013 ECHO EF 35% severe AS (low gradient severe AS confirmed by DSE), gradient: 32mm Hg (S). Peak gradient: 57mm Hg  08/18/13 ECHO EF 45-50% AV working well  S/P AVR/MVR with placement of epicardial lead on 06/15/13. She had post-op atrial fibrillation that terminated with amiodarone.  She is off amiodarone now due to nausea. Was discharged from the hospital 06/27/13.   Follow up: Feels great. Denies SOB, PND, CP or orthopnea. Walking all over the place without difficulty. Occasional swelling. Takes lasix every day and takes metolazone on Mondays. Had 30 day event monitor in 1/15 without recurrent AF.    Labs (9/14): K 5.4, creatinine 1.69    (09/2013): K+ 4.6, creatinine 1.08, pro-BNP 11643   (10/17/13): K+ 4.4, creatinine 1.6       (11/28/13): K+ 4.1, Creatinine 1.6   ROS: All systems negative except as listed in HPI, PMH and Problem List.  Past Medical History  Diagnosis Date  . Peripheral artery disease   . Hyperlipidemia     takes Simvastatin daily  . PONV (postoperative nausea and vomiting)   . Shortness of breath   . Arthritis   . Aortic stenosis 09/21/2011  . AORTIC VALVE SCLEROSIS 02/21/2010  . Arthus phenomenon  09/17/2007  . BUNDLE BRANCH BLOCK, LEFT 07/30/2007  . CAROTID ARTERY STENOSIS 01/05/2009  . CVA 03/14/2010  . 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 PERIPHERAL VASCULAR DISEASE 07/30/2007  . POLYPECTOMY, HX OF 07/30/2007  . Constipation   . CHF (congestive heart failure)     a. RHC 09/2011 RA 8, RV 58/2/11; PA 54/22 (37) PCWP 20; F CO/CI 4.57/2.67 PVR 3.7; b. L main no sig dz, LAD luminal irreg; LCx lg Ramus with luminal irreg, small AV Lcx witout sig dz, RCA luminal irreg; severe mitral annular calcifications; AV calcified c. EF 45-50% (07/2013)  . Diabetes mellitus   . DM 07/30/2007  . HTN (hypertension)   . HYPERTENSION 07/30/2007  . Weakness   . Dizziness   . Joint pain   . Itching   . Anemia   . Iron deficiency anemia 09/21/2011  . Nocturia   . History of blood transfusion   . History of shingles   . Mitral regurgitation   . S/P aortic valve replacement with bioprosthetic valve 06/15/2013    21 mm [facility] Ease bovine pericardial tissue valve  . S/P mitral valve replacement with bioprosthetic valve 06/15/2013    25 mm [facility] Mitral bovine pericardial tissue valve     Current Outpatient Prescriptions  Medication Sig Dispense Refill  . amLODipine (NORVASC) 5 MG tablet Take 1 tablet by mouth  daily  90 tablet  3  . calcium citrate-vitamin D (CITRACAL+D) 315-200 MG-UNIT per tablet Take 1 tablet by mouth daily.      Marland Kitchen  carvedilol (COREG) 6.25 MG tablet TAKE 1 TABLET BY MOUTH TWICE A DAY WITH MEALS  180 tablet  3  . docusate sodium (COLACE) 100 MG capsule Take 100 mg by mouth 2 (two) times daily.        . ferrous sulfate 325 (65 FE) MG tablet Take 1 tablet (325 mg total) by mouth 2 (two) times daily with a meal.  180 tablet  1  . furosemide (LASIX) 40 MG tablet Take 1 tablet (40 mg total) by mouth 2 (two) times daily.  180 tablet  3  . losartan (COZAAR) 100 MG tablet Take 1 tablet by mouth  daily  90 tablet  3  . metFORMIN (GLUCOPHAGE) 1000 MG tablet Take 1 tablet by mouth   twice a day with meals  180 tablet  3  . metolazone (ZAROXOLYN) 2.5 MG tablet Take 1 tablet (2.5 mg total) by mouth once a week. Every Monday  20 tablet  3  . Potassium Chloride ER 20 MEQ TBCR Take 20 meq (1 tablet) twice a day. On Mondays take an extra 20 meq of potassium.      . simvastatin (ZOCOR) 20 MG tablet Take 1 tablet (20 mg total) by mouth every evening.  90 tablet  3  . sitaGLIPtin (JANUVIA) 50 MG tablet Take 1 tablet (50 mg total) by mouth daily.  90 tablet  3  . warfarin (COUMADIN) 4 MG tablet 2 tablets daily except 1.5 tablets each Sunday or as directed by coumadin clinic  70 tablet  3   No current facility-administered medications for this encounter.    Filed Vitals:   07/10/14 1429  BP: 120/52  Pulse: 97  Weight: 130 lb 12.8 oz (59.33 kg)  SpO2: 97%    PHYSICAL EXAM: General:  Elderly. NAD; daughter present HEENT: normal Neck: supple. JVP 5-6  with prominent CV waves. Carotids 2+ bilaterally; no bruits. No lymphadenopathy or thryomegaly appreciated. Cor: PMI normal. Regular rate & rhythm. 2/6 SEM.  Lungs: CTA  Abdomen: soft, nontender, nondistended. No hepatosplenomegaly. No bruits or masses. Good bowel sounds. Extremities: no cyanosis, clubbing, rash, trace edema; TED hose intact  Neuro: alert & orientedx3, cranial nerves grossly intact. Moves all 4 extremities w/o difficulty. Affect pleasant  ASSESSMENT & PLAN:  1. Chronic diastolic CHF: EF improved to 50-55% (07/2013) post-AVR/MVR.  - Overall looks great. NYHA I-II. Volume status looks good. Will stop scheduled metolazone and only take as needed. I do not think we will be able to get her off of lasix - Check BMET today. --Will repeat echo.  - Reinforced the need and importance of daily weights, a low sodium diet, and fluid restriction (less than 2 L a day). Instructed to call the HF clinic if weight increases more than 3 lbs overnight or 5 lbs in a week.  2. Carotid stenosis: Reviewed carotid dopplers; 1-39%  LICA, 40-59% RICA, patent vertebral arteries with antegrade flow (11/2013) Repeat in 1 year.  3. Hyperlipidemia: Managed by PCP. Continue statin. 4. Aortic stenosis/mitral regurg: s/p bioprosthetic AVR/MVR.  Well-seated valves on post-op echo (07/2013). Continue to follow. Repeat echo. Reminded about SBE prophylaxis.  5. CKD: Last Cr elevated 1.6. Check today. 6. HTN: Stable. Will continue current medications.   7. Post-op AF - no AF on 30-day event monitor. Can stop warfarin. Start ASA 81.   F/U 3 months Arvilla Meres, MD 2:45 PM 07/10/2014

## 2014-07-12 ENCOUNTER — Telehealth (HOSPITAL_COMMUNITY): Payer: Self-pay

## 2014-07-12 NOTE — Telephone Encounter (Signed)
Lab results reviewed with daughter, instructed to have patient stop potassium, decrease lasix to once daily, and have lab work repeated next Tuesday.  Aware and agreeable. Ave Filter

## 2014-07-13 ENCOUNTER — Ambulatory Visit: Payer: Self-pay | Admitting: Cardiology

## 2014-07-13 DIAGNOSIS — Z953 Presence of xenogenic heart valve: Secondary | ICD-10-CM

## 2014-07-13 DIAGNOSIS — Z5181 Encounter for therapeutic drug level monitoring: Secondary | ICD-10-CM

## 2014-07-13 DIAGNOSIS — I4891 Unspecified atrial fibrillation: Secondary | ICD-10-CM

## 2014-07-18 ENCOUNTER — Ambulatory Visit (HOSPITAL_COMMUNITY)
Admission: RE | Admit: 2014-07-18 | Discharge: 2014-07-18 | Disposition: A | Discharge status: Home / Self Care | Payer: Medicare Other | Source: Ambulatory Visit | Attending: Internal Medicine | Admitting: Internal Medicine

## 2014-07-18 DIAGNOSIS — I509 Heart failure, unspecified: Secondary | ICD-10-CM | POA: Insufficient documentation

## 2014-07-18 DIAGNOSIS — I5032 Chronic diastolic (congestive) heart failure: Secondary | ICD-10-CM

## 2014-07-18 LAB — BASIC METABOLIC PANEL
Anion gap: 14 (ref 5–15)
BUN: 31 mg/dL — AB (ref 6–23)
CO2: 26 mEq/L (ref 19–32)
Calcium: 10.2 mg/dL (ref 8.4–10.5)
Chloride: 101 mEq/L (ref 96–112)
Creatinine, Ser: 1.13 mg/dL — ABNORMAL HIGH (ref 0.50–1.10)
GFR, EST AFRICAN AMERICAN: 51 mL/min — AB (ref >90)
GFR, EST NON AFRICAN AMERICAN: 44 mL/min — AB (ref >90)
Glucose, Bld: 150 mg/dL — ABNORMAL HIGH (ref 70–99)
Potassium: 4.7 mEq/L (ref 3.7–5.3)
Sodium: 141 mEq/L (ref 137–147)

## 2014-08-01 ENCOUNTER — Encounter: Payer: Self-pay | Admitting: Internal Medicine

## 2014-09-27 ENCOUNTER — Encounter (HOSPITAL_COMMUNITY): Payer: Self-pay | Admitting: Internal Medicine

## 2014-10-06 ENCOUNTER — Telehealth (HOSPITAL_COMMUNITY): Payer: Self-pay | Admitting: Vascular Surgery

## 2014-10-06 NOTE — Telephone Encounter (Signed)
PT ankles are swollen twice the normal size.Marland Kitchen Pt daughter wants to know how much more Lasix should the pt take.Marland Kitchen Please advise

## 2014-10-06 NOTE — Telephone Encounter (Signed)
Spoke w/pt's daughter she states pt's ankles have been swollen for about 3 weeks and worse today, she states they are double the size they should be, she is unsure about pt's wt but feels like it is up and down, she has not noticed pt being more SOB than usual.  She reports pt is taking Lasix 40 mg daily, advised pt to take metolazone 2.5 mg tonight and tomorrow along with lasix 40 mg bid, and keep feet elevated when possible, she will call us on Mon with an update on how pt is doing

## 2014-11-16 LAB — HM DIABETES EYE EXAM

## 2014-11-22 ENCOUNTER — Telehealth (HOSPITAL_COMMUNITY): Payer: Self-pay | Admitting: Vascular Surgery

## 2014-11-22 NOTE — Telephone Encounter (Signed)
pts ankles are very swollen Pt felt very bad 11/21/14 Legs were painful Pt would like to take potassium Pt does not wear ted consistently Pt sometimes c/o feeling sick to the stomach ? Related to DM Weight ranges 132-128 Denies sob   Pt given next available office visit 12/13/13 @ 940

## 2014-11-22 NOTE — Telephone Encounter (Signed)
Pt daughter called pt is not feeling well , pt legs are hurting she wants to know if she can take her potassium. Please advise

## 2014-12-13 ENCOUNTER — Ambulatory Visit (INDEPENDENT_AMBULATORY_CARE_PROVIDER_SITE_OTHER)
Admission: RE | Admit: 2014-12-13 | Discharge: 2014-12-13 | Disposition: A | Discharge status: Home / Self Care | Payer: Medicare Other | Source: Ambulatory Visit | Attending: Adult Health | Admitting: Adult Health

## 2014-12-13 ENCOUNTER — Encounter (HOSPITAL_COMMUNITY): Payer: Self-pay | Admitting: General Practice

## 2014-12-13 ENCOUNTER — Encounter (HOSPITAL_COMMUNITY): Payer: Medicare Other

## 2014-12-13 ENCOUNTER — Other Ambulatory Visit (HOSPITAL_COMMUNITY): Payer: Self-pay | Admitting: Cardiology

## 2014-12-13 ENCOUNTER — Inpatient Hospital Stay (HOSPITAL_COMMUNITY)
Admission: AD | Admit: 2014-12-13 | Discharge: 2014-12-15 | DRG: 292 | Disposition: A | Discharge status: Home / Self Care | Payer: Medicare Other | Source: Ambulatory Visit | Attending: Internal Medicine | Admitting: Internal Medicine

## 2014-12-13 ENCOUNTER — Telehealth (HOSPITAL_COMMUNITY): Payer: Self-pay | Admitting: Cardiology

## 2014-12-13 VITALS — BP 114/60 | HR 83 | Wt 139.2 lb

## 2014-12-13 DIAGNOSIS — R0609 Other forms of dyspnea: Secondary | ICD-10-CM | POA: Diagnosis present

## 2014-12-13 DIAGNOSIS — Z8619 Personal history of other infectious and parasitic diseases: Secondary | ICD-10-CM | POA: Diagnosis not present

## 2014-12-13 DIAGNOSIS — Y9389 Activity, other specified: Secondary | ICD-10-CM | POA: Diagnosis not present

## 2014-12-13 DIAGNOSIS — Z79899 Other long term (current) drug therapy: Secondary | ICD-10-CM

## 2014-12-13 DIAGNOSIS — I739 Peripheral vascular disease, unspecified: Secondary | ICD-10-CM | POA: Diagnosis present

## 2014-12-13 DIAGNOSIS — Z7982 Long term (current) use of aspirin: Secondary | ICD-10-CM | POA: Diagnosis not present

## 2014-12-13 DIAGNOSIS — Z872 Personal history of diseases of the skin and subcutaneous tissue: Secondary | ICD-10-CM | POA: Diagnosis not present

## 2014-12-13 DIAGNOSIS — S0990XA Unspecified injury of head, initial encounter: Secondary | ICD-10-CM | POA: Diagnosis present

## 2014-12-13 DIAGNOSIS — I129 Hypertensive chronic kidney disease with stage 1 through stage 4 chronic kidney disease, or unspecified chronic kidney disease: Secondary | ICD-10-CM | POA: Diagnosis present

## 2014-12-13 DIAGNOSIS — E785 Hyperlipidemia, unspecified: Secondary | ICD-10-CM | POA: Diagnosis present

## 2014-12-13 DIAGNOSIS — I480 Paroxysmal atrial fibrillation: Secondary | ICD-10-CM | POA: Diagnosis present

## 2014-12-13 DIAGNOSIS — Z862 Personal history of diseases of the blood and blood-forming organs and certain disorders involving the immune mechanism: Secondary | ICD-10-CM | POA: Diagnosis not present

## 2014-12-13 DIAGNOSIS — I6523 Occlusion and stenosis of bilateral carotid arteries: Secondary | ICD-10-CM

## 2014-12-13 DIAGNOSIS — D509 Iron deficiency anemia, unspecified: Secondary | ICD-10-CM | POA: Diagnosis present

## 2014-12-13 DIAGNOSIS — I10 Essential (primary) hypertension: Secondary | ICD-10-CM | POA: Diagnosis present

## 2014-12-13 DIAGNOSIS — I428 Other cardiomyopathies: Secondary | ICD-10-CM | POA: Diagnosis present

## 2014-12-13 DIAGNOSIS — Y998 Other external cause status: Secondary | ICD-10-CM | POA: Diagnosis not present

## 2014-12-13 DIAGNOSIS — Z952 Presence of prosthetic heart valve: Secondary | ICD-10-CM | POA: Diagnosis not present

## 2014-12-13 DIAGNOSIS — W0110XA Fall on same level from slipping, tripping and stumbling with subsequent striking against unspecified object, initial encounter: Secondary | ICD-10-CM | POA: Diagnosis not present

## 2014-12-13 DIAGNOSIS — Y92810 Car as the place of occurrence of the external cause: Secondary | ICD-10-CM | POA: Diagnosis not present

## 2014-12-13 DIAGNOSIS — S0083XA Contusion of other part of head, initial encounter: Secondary | ICD-10-CM | POA: Diagnosis not present

## 2014-12-13 DIAGNOSIS — Z953 Presence of xenogenic heart valve: Secondary | ICD-10-CM

## 2014-12-13 DIAGNOSIS — I779 Disorder of arteries and arterioles, unspecified: Secondary | ICD-10-CM | POA: Diagnosis present

## 2014-12-13 DIAGNOSIS — I5043 Acute on chronic combined systolic (congestive) and diastolic (congestive) heart failure: Secondary | ICD-10-CM | POA: Diagnosis present

## 2014-12-13 DIAGNOSIS — N183 Chronic kidney disease, stage 3 unspecified: Secondary | ICD-10-CM | POA: Diagnosis present

## 2014-12-13 DIAGNOSIS — E1129 Type 2 diabetes mellitus with other diabetic kidney complication: Secondary | ICD-10-CM

## 2014-12-13 DIAGNOSIS — I5042 Chronic combined systolic (congestive) and diastolic (congestive) heart failure: Secondary | ICD-10-CM | POA: Diagnosis not present

## 2014-12-13 DIAGNOSIS — E119 Type 2 diabetes mellitus without complications: Secondary | ICD-10-CM | POA: Diagnosis present

## 2014-12-13 DIAGNOSIS — Y92410 Unspecified street and highway as the place of occurrence of the external cause: Secondary | ICD-10-CM | POA: Diagnosis not present

## 2014-12-13 DIAGNOSIS — I5033 Acute on chronic diastolic (congestive) heart failure: Secondary | ICD-10-CM

## 2014-12-13 DIAGNOSIS — R011 Cardiac murmur, unspecified: Secondary | ICD-10-CM | POA: Diagnosis not present

## 2014-12-13 DIAGNOSIS — Z8673 Personal history of transient ischemic attack (TIA), and cerebral infarction without residual deficits: Secondary | ICD-10-CM | POA: Diagnosis not present

## 2014-12-13 DIAGNOSIS — M1990 Unspecified osteoarthritis, unspecified site: Secondary | ICD-10-CM | POA: Diagnosis not present

## 2014-12-13 DIAGNOSIS — K5900 Constipation, unspecified: Secondary | ICD-10-CM | POA: Diagnosis not present

## 2014-12-13 DIAGNOSIS — I5022 Chronic systolic (congestive) heart failure: Secondary | ICD-10-CM

## 2014-12-13 DIAGNOSIS — R0600 Dyspnea, unspecified: Secondary | ICD-10-CM

## 2014-12-13 HISTORY — DX: Chronic combined systolic (congestive) and diastolic (congestive) heart failure: I50.42

## 2014-12-13 LAB — CBC WITH DIFFERENTIAL/PLATELET
Basophils Absolute: 0 10*3/uL (ref 0.0–0.1)
Basophils Relative: 0 % (ref 0–1)
EOS ABS: 0.2 10*3/uL (ref 0.0–0.7)
Eosinophils Relative: 4 % (ref 0–5)
HCT: 31.3 % — ABNORMAL LOW (ref 36.0–46.0)
Hemoglobin: 9.9 g/dL — ABNORMAL LOW (ref 12.0–15.0)
Lymphocytes Relative: 15 % (ref 12–46)
Lymphs Abs: 0.9 10*3/uL (ref 0.7–4.0)
MCH: 29.4 pg (ref 26.0–34.0)
MCHC: 31.6 g/dL (ref 30.0–36.0)
MCV: 92.9 fL (ref 78.0–100.0)
MONOS PCT: 5 % (ref 3–12)
Monocytes Absolute: 0.3 10*3/uL (ref 0.1–1.0)
Neutro Abs: 4.4 10*3/uL (ref 1.7–7.7)
Neutrophils Relative %: 76 % (ref 43–77)
Platelets: 214 10*3/uL (ref 150–400)
RBC: 3.37 MIL/uL — AB (ref 3.87–5.11)
RDW: 14.2 % (ref 11.5–15.5)
WBC: 5.8 10*3/uL (ref 4.0–10.5)

## 2014-12-13 LAB — MAGNESIUM: MAGNESIUM: 1.8 mg/dL (ref 1.5–2.5)

## 2014-12-13 LAB — COMPREHENSIVE METABOLIC PANEL
ALBUMIN: 3.6 g/dL (ref 3.5–5.2)
ALT: 26 U/L (ref 0–35)
ANION GAP: 12 (ref 5–15)
AST: 28 U/L (ref 0–37)
Alkaline Phosphatase: 42 U/L (ref 39–117)
BUN: 43 mg/dL — AB (ref 6–23)
CHLORIDE: 99 mmol/L (ref 96–112)
CO2: 28 mmol/L (ref 19–32)
CREATININE: 1.45 mg/dL — AB (ref 0.50–1.10)
Calcium: 9.9 mg/dL (ref 8.4–10.5)
GFR, EST AFRICAN AMERICAN: 38 mL/min — AB (ref >90)
GFR, EST NON AFRICAN AMERICAN: 33 mL/min — AB (ref >90)
Glucose, Bld: 128 mg/dL — ABNORMAL HIGH (ref 70–99)
Potassium: 4 mmol/L (ref 3.5–5.1)
Sodium: 139 mmol/L (ref 135–145)
Total Bilirubin: 0.9 mg/dL (ref 0.3–1.2)
Total Protein: 6 g/dL (ref 6.0–8.3)

## 2014-12-13 LAB — TSH: TSH: 2.01 u[IU]/mL (ref 0.350–4.500)

## 2014-12-13 LAB — BRAIN NATRIURETIC PEPTIDE: B Natriuretic Peptide: 3083.2 pg/mL — ABNORMAL HIGH (ref 0.0–100.0)

## 2014-12-13 MED ORDER — SODIUM CHLORIDE 0.9 % IJ SOLN: 3.0000 mL | INTRAMUSCULAR | Status: DC | PRN

## 2014-12-13 MED ORDER — SODIUM CHLORIDE 0.9 % IJ SOLN
3.0000 mL | Freq: Two times a day (BID) | INTRAMUSCULAR | Status: DC
  Administered 2014-12-13 – 2014-12-15 (×5): 3 mL via INTRAVENOUS

## 2014-12-13 MED ORDER — FUROSEMIDE 10 MG/ML IJ SOLN
80.0000 mg | Freq: Two times a day (BID) | INTRAMUSCULAR | Status: DC
  Administered 2014-12-13 – 2014-12-14 (×3): 80 mg via INTRAVENOUS
  Filled 2014-12-13 (×6): qty 8

## 2014-12-13 MED ORDER — FERROUS SULFATE 325 (65 FE) MG PO TABS
325.0000 mg | ORAL_TABLET | Freq: Two times a day (BID) | ORAL | Status: DC
  Administered 2014-12-13 – 2014-12-15 (×4): 325 mg via ORAL
  Filled 2014-12-13 (×6): qty 1

## 2014-12-13 MED ORDER — METFORMIN HCL 500 MG PO TABS
1000.0000 mg | ORAL_TABLET | Freq: Two times a day (BID) | ORAL | Status: DC
  Filled 2014-12-13 (×2): qty 2

## 2014-12-13 MED ORDER — ENOXAPARIN SODIUM 30 MG/0.3ML ~~LOC~~ SOLN
30.0000 mg | SUBCUTANEOUS | Status: DC
  Administered 2014-12-13 – 2014-12-14 (×2): 30 mg via SUBCUTANEOUS
  Filled 2014-12-13 (×3): qty 0.3

## 2014-12-13 MED ORDER — LINAGLIPTIN 5 MG PO TABS
5.0000 mg | ORAL_TABLET | Freq: Every day | ORAL | Status: DC
  Administered 2014-12-13 – 2014-12-15 (×3): 5 mg via ORAL
  Filled 2014-12-13 (×4): qty 1

## 2014-12-13 MED ORDER — DOCUSATE SODIUM 100 MG PO CAPS
100.0000 mg | ORAL_CAPSULE | Freq: Two times a day (BID) | ORAL | Status: DC
  Administered 2014-12-13 – 2014-12-15 (×4): 100 mg via ORAL
  Filled 2014-12-13 (×5): qty 1

## 2014-12-13 MED ORDER — SODIUM CHLORIDE 0.9 % IV SOLN: 250.0000 mL | INTRAVENOUS | Status: DC | PRN

## 2014-12-13 MED ORDER — ASPIRIN EC 81 MG PO TBEC
81.0000 mg | DELAYED_RELEASE_TABLET | Freq: Every day | ORAL | Status: DC
  Administered 2014-12-14 – 2014-12-15 (×2): 81 mg via ORAL
  Filled 2014-12-13 (×2): qty 1

## 2014-12-13 MED ORDER — CALCIUM CITRATE-VITAMIN D 315-200 MG-UNIT PO TABS: 1.0000 | ORAL_TABLET | Freq: Every day | ORAL | Status: DC

## 2014-12-13 MED ORDER — CALCIUM CARBONATE-VITAMIN D 500-200 MG-UNIT PO TABS
1.0000 | ORAL_TABLET | Freq: Every day | ORAL | Status: DC
  Administered 2014-12-14 – 2014-12-15 (×2): 1 via ORAL
  Filled 2014-12-13 (×3): qty 1

## 2014-12-13 MED ORDER — ACETAMINOPHEN 325 MG PO TABS: 650.0000 mg | ORAL_TABLET | ORAL | Status: DC | PRN

## 2014-12-13 MED ORDER — SIMVASTATIN 20 MG PO TABS
20.0000 mg | ORAL_TABLET | Freq: Every evening | ORAL | Status: DC
  Administered 2014-12-13 – 2014-12-14 (×2): 20 mg via ORAL
  Filled 2014-12-13 (×3): qty 1

## 2014-12-13 MED ORDER — METOLAZONE 2.5 MG PO TABS
2.5000 mg | ORAL_TABLET | Freq: Every day | ORAL | Status: DC
  Administered 2014-12-14: 2.5 mg via ORAL
  Filled 2014-12-13 (×2): qty 1

## 2014-12-13 MED ORDER — AMLODIPINE BESYLATE 5 MG PO TABS
5.0000 mg | ORAL_TABLET | Freq: Every day | ORAL | Status: DC
  Administered 2014-12-14: 5 mg via ORAL
  Filled 2014-12-13 (×2): qty 1

## 2014-12-13 MED ORDER — LOSARTAN POTASSIUM 50 MG PO TABS
100.0000 mg | ORAL_TABLET | Freq: Every day | ORAL | Status: DC
  Administered 2014-12-14 – 2014-12-15 (×2): 100 mg via ORAL
  Filled 2014-12-13 (×2): qty 2

## 2014-12-13 MED ORDER — ONDANSETRON HCL 4 MG/2ML IJ SOLN: 4.0000 mg | Freq: Four times a day (QID) | INTRAMUSCULAR | Status: DC | PRN

## 2014-12-13 MED ORDER — CARVEDILOL 6.25 MG PO TABS
6.2500 mg | ORAL_TABLET | Freq: Two times a day (BID) | ORAL | Status: DC
  Administered 2014-12-13 – 2014-12-15 (×4): 6.25 mg via ORAL
  Filled 2014-12-13 (×6): qty 1

## 2014-12-13 NOTE — H&P (Signed)
PCP: Dr Yetta Barre ([facility] [address]) CVTS Surgeon: Dr. Cornelius Moras Primary Cardiologist: Dr. Gala Romney  HPI: Joanna [name] is a 81 year old woman with aortic stenosis s/p AVR/mitral valve replacement (06/15/2013), systolic HF due to NICM, hypertension, diabetes mellitus type II, hyperlipidemia, and peripheral artery disease. Underwent AVR/MVR with placement of epicardial lead on 06/15/13 with PAF post-op terminated with amiodarone. She is off amiodarone now due to nausea..   Admitted initially in 09/2011 with acute systolic HF, EF 30%. Cath with normal coronaries and normal cardiac output (see below). AS was mild to moderate. In hospital also found to be iron-deficiency anemia. Previous colonoscopies normal. Seen by GI who suggested trial of iron and see if it improved.  Had 30 day event monitor in 1/15 without recurrent AF.   Follow up: Returns for f/u with her family. For past 3 weeks has been retaining fluid in her legs. Weight now up 11 pounds. Has taken extra lasix and 2 doses of metolazone without much improvement. Legs oozing. + dyspnea on exertion. Has been eating out a lot.    Labs (9/14): K 5.4, creatinine 1.69  (09/2013): K+ 4.6, creatinine 1.08, pro-BNP 11643  (10/17/13): K+ 4.4, creatinine 1.6  (11/28/13): K+ 4.1, Creatinine 1.6   ROS: 12-point review of systems went over in clinic and all negative except as described in HPI and problem list.  Past Medical History  Diagnosis Date  . Peripheral artery disease   . Hyperlipidemia     takes Simvastatin daily  . PONV (postoperative nausea and vomiting)   . Shortness of breath   . Arthritis   . Aortic stenosis 09/21/2011  . AORTIC VALVE SCLEROSIS 02/21/2010  . 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 BUNDLE BRANCH BLOCK, LEFT 07/30/2007  . CAROTID ARTERY STENOSIS 01/05/2009  . CVA 03/14/2010  . 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 PERIPHERAL VASCULAR DISEASE 07/30/2007  . POLYPECTOMY, HX OF  07/30/2007  . Constipation   . CHF (congestive heart failure)     a. RHC 09/2011 RA 8, RV 58/2/11; PA 54/22 (37) PCWP 20; F CO/CI 4.57/2.67 PVR 3.7; b. L main no sig dz, LAD luminal irreg; LCx lg Ramus with luminal irreg, small AV Lcx witout sig dz, RCA luminal irreg; severe mitral annular calcifications; AV calcified c. EF 45-50% (07/2013)  . Diabetes mellitus   . DM 07/30/2007  . HTN (hypertension)   . HYPERTENSION 07/30/2007  . Weakness   . Dizziness   . Joint pain   . Itching   . Anemia   . Iron deficiency anemia 09/21/2011  . Nocturia   . History of blood transfusion   . History of shingles   . Mitral regurgitation   . S/P aortic valve replacement with bioprosthetic valve 06/15/2013    21 mm [facility] Ease bovine pericardial tissue valve  . S/P mitral valve replacement with bioprosthetic valve 06/15/2013    25 mm [facility] Mitral bovine pericardial tissue valve     Current Outpatient Prescriptions  Medication Sig Dispense Refill  . amLODipine (NORVASC) 5 MG tablet Take 1 tablet by mouth daily 90 tablet 3  . aspirin EC 81 MG tablet Take 1 tablet (81 mg total) by mouth daily. 90 tablet 3  . calcium citrate-vitamin D (CITRACAL+D) 315-200 MG-UNIT per tablet Take 1 tablet by mouth daily.    . carvedilol (COREG) 6.25 MG tablet TAKE 1 TABLET BY MOUTH TWICE A DAY WITH MEALS 180 tablet 3  . docusate sodium (COLACE) 100 MG capsule Take 100 mg by mouth 2 (two)  times daily.     . ferrous sulfate 325 (65 FE) MG tablet Take 1 tablet (325 mg total) by mouth 2 (two) times daily with a meal. 180 tablet 1  . furosemide (LASIX) 40 MG tablet Take 40 mg by mouth daily.    Marland Kitchen losartan (COZAAR) 100 MG tablet Take 1 tablet by mouth daily 90 tablet 3  . metFORMIN (GLUCOPHAGE) 1000 MG tablet Take 1 tablet by mouth twice a day with meals 180 tablet 3  . metolazone (ZAROXOLYN) 2.5  MG tablet Take 2.5 mg by mouth as needed.    . simvastatin (ZOCOR) 20 MG tablet Take 1 tablet (20 mg total) by mouth every evening. 90 tablet 3  . sitaGLIPtin (JANUVIA) 50 MG tablet Take 1 tablet (50 mg total) by mouth daily. 90 tablet 3   No current facility-administered medications for this encounter.   History   Social History  . Marital Status: Divorced    Spouse Name: N/A  . Number of Children: N/A  . Years of Education: N/A   Social History Main Topics  . Smoking status: Never Smoker   . Smokeless tobacco: Never Used  . Alcohol Use: No  . Drug Use: No  . Sexual Activity: No   Other Topics Concern  . Not on file   Social History Narrative   Patient lives alone here in town   She continues to work, helping to bind and oversew books   She is divorced after 23 years of marriage   1 son- '61, minister; 1 dtr '55; 4 grandchildren   End of life care-provided packet on living well   Family History  Problem Relation Age of Onset  . Colon cancer Mother   . Diabetes Neg Hx   . Coronary artery disease Neg Hx       Filed Vitals:   12/13/14 0955  BP: 114/60  Pulse: 83  Weight: 139 lb 4 oz (63.163 kg)  SpO2: 94%    PHYSICAL EXAM: General: Elderly. NAD; daughter and grandson present HEENT: normal Neck: supple. JVP 9-10 with prominent CV waves. Carotids 2+ bilaterally; no bruits. No lymphadenopathy or thryomegaly appreciated. Cor: PMI normal. Regular rate & rhythm. 2/6 SEM.  Lungs: CTA  Abdomen: soft, nontender, nondistended. No hepatosplenomegaly. No bruits or masses. Good bowel sounds. Extremities: no cyanosis, clubbing, rash, 3+ edema into thighs; TED hose intact  Neuro: alert & orientedx3, cranial nerves grossly intact. Moves all 4 extremities w/o difficulty. Affect pleasant  ASSESSMENT & PLAN:  1. Chronic diastolic CHF: EF improved to 50-55% (07/2013)  post-AVR/MVR.  - Marked volume overload in setting of some dietary indiscretion. She is failing intensification of her outpatient diuretic regimen including metolazone. We discussed option of outpatient management with demadex versus hospitalizing for IV diuresis. I think the latter is better. We will admit her today. Start IV lasix 80 bid. + metolazone. Will repeat echo.  2. Carotid stenosis: Reviewed carotid dopplers; 1-39% LICA, 40-59% RICA, patent vertebral arteries with antegrade flow (11/2013). Due for repeat 3. Hyperlipidemia: Managed by PCP. Continue statin. 4. Aortic stenosis/mitral regurg: s/p bioprosthetic AVR/MVR. Well-seated valves on post-op echo (07/2013). Continue to follow. Repeat echo. Reminded about SBE prophylaxis.  5. CKD: Follow closely in house.  6. HTN: Stable. Will continue current medications.  7. Post-op AF - no evidence of recurrence.  CLEGG,Joanna   Patient seen and examined with Joanna Becket, NP. We discussed all aspects of the encounter. I agree with the assessment and plan as stated above.  She has marked volume overload. Failing intensification of outpatient regimen. Admit for IV diuresis.   Joanna [name], MD10:16 AM 12/13/2014

## 2014-12-13 NOTE — Progress Notes (Signed)
Patient ID: Joanna [name], female   DOB: [birth year], 81 y.o.   MRN: [id number]  PCP: Dr Yetta Barre ([facility] on [place]) CVTS Surgeon: Dr. Cornelius Moras Primary Cardiologist: Dr. Gala Romney  HPI: Joanna [name] is a 81 year old woman with aortic stenosis s/p AVR/mitral valve replacement (06/15/2013), systolic HF due to NICM, hypertension, diabetes mellitus type II, hyperlipidemia, and peripheral artery disease. Underwent AVR/MVR with placement of epicardial lead on 06/15/13 with PAF post-op terminated with amiodarone.  She is off amiodarone now due to nausea..   Admitted initially in 09/2011 with acute systolic HF, EF 30%. Cath with normal coronaries and normal cardiac output (see below). AS was mild to moderate. In hospital also found to be iron-deficiency anemia. Previous colonoscopies normal. Seen by GI who suggested trial of iron and see if it improved.  05/19/2013 ECHO EF 35% severe AS (low gradient severe AS confirmed by DSE), gradient: 32mm Hg (S). Peak gradient: 57mm Hg  08/18/13 ECHO EF 45-50% AV working well  Had 30 day event monitor in 1/15 without recurrent AF.   Follow up: Returns for f/u with her family. For past 3 weeks has been retaining fluid in her legs. Weight now up 11 pounds. Has taken extra lasix and 2 doses of metolazone without much improvement. Legs oozing. + dyspnea on exertion. Has been eating out a lot.    Labs (9/14): K 5.4, creatinine 1.69    (09/2013): K+ 4.6, creatinine 1.08, pro-BNP 11643   (10/17/13): K+ 4.4, creatinine 1.6       (11/28/13): K+ 4.1, Creatinine 1.6   ROS:  12-point review of systems went over in clinic and all negative except as described in HPI and problem list.  Past Medical History  Diagnosis Date  . Peripheral artery disease   . Hyperlipidemia     takes Simvastatin daily  . PONV (postoperative nausea and vomiting)   . Shortness of breath   . Arthritis   . Aortic stenosis 09/21/2011  . AORTIC VALVE SCLEROSIS 02/21/2010  . 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 BUNDLE  BRANCH BLOCK, LEFT 07/30/2007  . CAROTID ARTERY STENOSIS 01/05/2009  . CVA 03/14/2010  . 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 PERIPHERAL VASCULAR DISEASE 07/30/2007  . POLYPECTOMY, HX OF 07/30/2007  . Constipation   . CHF (congestive heart failure)     a. RHC 09/2011 RA 8, RV 58/2/11; PA 54/22 (37) PCWP 20; F CO/CI 4.57/2.67 PVR 3.7; b. L main no sig dz, LAD luminal irreg; LCx lg Ramus with luminal irreg, small AV Lcx witout sig dz, RCA luminal irreg; severe mitral annular calcifications; AV calcified c. EF 45-50% (07/2013)  . Diabetes mellitus   . DM 07/30/2007  . HTN (hypertension)   . HYPERTENSION 07/30/2007  . Weakness   . Dizziness   . Joint pain   . Itching   . Anemia   . Iron deficiency anemia 09/21/2011  . Nocturia   . History of blood transfusion   . History of shingles   . Mitral regurgitation   . S/P aortic valve replacement with bioprosthetic valve 06/15/2013    21 mm [facility] Ease bovine pericardial tissue valve  . S/P mitral valve replacement with bioprosthetic valve 06/15/2013    25 mm [facility] Mitral bovine pericardial tissue valve     Current Outpatient Prescriptions  Medication Sig Dispense Refill  . amLODipine (NORVASC) 5 MG tablet Take 1 tablet by mouth  daily 90 tablet 3  . aspirin EC 81 MG tablet Take 1 tablet (81 mg total) by  mouth daily. 90 tablet 3  . calcium citrate-vitamin D (CITRACAL+D) 315-200 MG-UNIT per tablet Take 1 tablet by mouth daily.    . carvedilol (COREG) 6.25 MG tablet TAKE 1 TABLET BY MOUTH TWICE A DAY WITH MEALS 180 tablet 3  . docusate sodium (COLACE) 100 MG capsule Take 100 mg by mouth 2 (two) times daily.      . ferrous sulfate 325 (65 FE) MG tablet Take 1 tablet (325 mg total) by mouth 2 (two) times daily with a meal. 180 tablet 1  . furosemide (LASIX) 40 MG tablet Take 40 mg by mouth daily.    Marland Kitchen losartan (COZAAR) 100 MG tablet Take 1 tablet by mouth  daily 90 tablet 3  . metFORMIN (GLUCOPHAGE) 1000 MG tablet Take 1 tablet by  mouth  twice a day with meals 180 tablet 3  . metolazone (ZAROXOLYN) 2.5 MG tablet Take 2.5 mg by mouth as needed.    . simvastatin (ZOCOR) 20 MG tablet Take 1 tablet (20 mg total) by mouth every evening. 90 tablet 3  . sitaGLIPtin (JANUVIA) 50 MG tablet Take 1 tablet (50 mg total) by mouth daily. 90 tablet 3   No current facility-administered medications for this encounter.   History   Social History  . Marital Status: Divorced    Spouse Name: N/A  . Number of Children: N/A  . Years of Education: N/A   Social History Main Topics  . Smoking status: Never Smoker   . Smokeless tobacco: Never Used  . Alcohol Use: No  . Drug Use: No  . Sexual Activity: No   Other Topics Concern  . Not on file   Social History Narrative   Patient lives alone here in town   She continues to work, helping to bind and oversew books   She is divorced after 23 years of marriage   1 son- '61, minister; 1 dtr '55; 4 grandchildren   End of life care-provided packet on living well   Family History  Problem Relation Age of Onset  . Colon cancer Mother   . Diabetes Neg Hx   . Coronary artery disease Neg Hx       Filed Vitals:   12/13/14 0955  BP: 114/60  Pulse: 83  Weight: 139 lb 4 oz (63.163 kg)  SpO2: 94%    PHYSICAL EXAM: General:  Elderly. NAD; daughter and grandson present HEENT: normal Neck: supple. JVP 9-10  with prominent CV waves. Carotids 2+ bilaterally; no bruits. No lymphadenopathy or thryomegaly appreciated. Cor: PMI normal. Regular rate & rhythm. 2/6 SEM.  Lungs: CTA  Abdomen: soft, nontender, nondistended. No hepatosplenomegaly. No bruits or masses. Good bowel sounds. Extremities: no cyanosis, clubbing, rash, 3+ edema into thighs; TED hose intact  Neuro: alert & orientedx3, cranial nerves grossly intact. Moves all 4 extremities w/o difficulty. Affect pleasant  ASSESSMENT & PLAN:  1. Chronic diastolic CHF: EF improved to 50-55% (07/2013) post-AVR/MVR.  - Marked volume  overload in setting of some dietary indiscretion. She is failing intensification of her outpatient diuretic regimen including metolazone. We discussed option of outpatient management with demadex versus hospitalizing for IV diuresis. I think the latter is better. We will admit her today. Start IV lasix 80 bid. + metolazone. Will repeat echo.  2. Carotid stenosis: Reviewed carotid dopplers; 1-39% LICA, 40-59% RICA, patent vertebral arteries with antegrade flow (11/2013). Due for repeat 3. Hyperlipidemia: Managed by PCP. Continue statin. 4. Aortic stenosis/mitral regurg: s/p bioprosthetic AVR/MVR.  Well-seated valves on post-op echo (07/2013).  Continue to follow. Repeat echo. Reminded about SBE prophylaxis.  5. CKD: Follow closely in house.  6. HTN: Stable. Will continue current medications.   7. Post-op AF - no evidence of recurrence.   Arvilla Meres, MD 10:16 AM 12/13/2014

## 2014-12-13 NOTE — Telephone Encounter (Signed)
Pt to be admitted from chf clinic Cpt code 79024 With pts current insurance [employer]/medicare No pre cert required

## 2014-12-14 ENCOUNTER — Inpatient Hospital Stay (HOSPITAL_COMMUNITY): Payer: Medicare Other

## 2014-12-14 DIAGNOSIS — I509 Heart failure, unspecified: Secondary | ICD-10-CM

## 2014-12-14 LAB — BASIC METABOLIC PANEL
ANION GAP: 12 (ref 5–15)
BUN: 41 mg/dL — ABNORMAL HIGH (ref 6–23)
CALCIUM: 9.7 mg/dL (ref 8.4–10.5)
CO2: 30 mmol/L (ref 19–32)
CREATININE: 1.47 mg/dL — AB (ref 0.50–1.10)
Chloride: 98 mmol/L (ref 96–112)
GFR calc Af Amer: 37 mL/min — ABNORMAL LOW (ref >90)
GFR calc non Af Amer: 32 mL/min — ABNORMAL LOW (ref >90)
Glucose, Bld: 155 mg/dL — ABNORMAL HIGH (ref 70–99)
Potassium: 3.5 mmol/L (ref 3.5–5.1)
Sodium: 140 mmol/L (ref 135–145)

## 2014-12-14 NOTE — Progress Notes (Signed)
  Echocardiogram 2D Echocardiogram has been performed.  Janalyn Harder 12/14/2014, 2:59 PM

## 2014-12-14 NOTE — Clinical Documentation Improvement (Signed)
  H&P and PN state "Marked volume overload" with "3+ edema to thighs", hx Chronic diastolic CHF and started on Lasix IV 80mg  and metolazone. Please clarify acuity of CHF.   Possible Conditions -- Acute on chronic diastolic CHF  -- Chronic diastolic CHF -- Other condition  Thank you,  Beverley Fiedler ,RN Clinical Documentation Specialist:  [phone]  [facility]- Health Information Management

## 2014-12-14 NOTE — Progress Notes (Signed)
Advanced Heart Failure Rounding Note   Subjective:     Diuresing well. Breathing ok. Renal function stable.    Objective:   Weight Range:  Vital Signs:   Temp:  [97.4 F (36.3 C)-98.6 F (37 C)] 97.4 F (36.3 C) (02/25 0753) Pulse Rate:  [66-88] 66 (02/25 0753) Resp:  [16-18] 16 (02/25 0753) BP: (96-117)/(43-65) 102/48 mmHg (02/25 0753) SpO2:  [93 %-98 %] 94 % (02/25 0753) Weight:  [60.2 kg (132 lb 11.5 oz)-63.163 kg (139 lb 4 oz)] 60.2 kg (132 lb 11.5 oz) (02/25 0629) Last BM Date: 12/13/14  Weight change: Filed Weights   12/13/14 1137 12/14/14 0629  Weight: 61.417 kg (135 lb 6.4 oz) 60.2 kg (132 lb 11.5 oz)    Intake/Output:   Intake/Output Summary (Last 24 hours) at 12/14/14 0920 Last data filed at 12/14/14 7253  Gross per 24 hour  Intake    810 ml  Output   2925 ml  Net  -2115 ml     PHYSICAL EXAM: General: Elderly. NAD; HEENT: normal Neck: supple. JVP 10 with prominent CV waves. Carotids 2+ bilaterally; no bruits. No lymphadenopathy or thryomegaly appreciated. Cor: PMI normal. Regular rate & rhythm. 2/6 SEM.  Lungs: CTA  Abdomen: soft, nontender, nondistended. No hepatosplenomegaly. No bruits or masses. Good bowel sounds. Extremities: no cyanosis, clubbing, rash, 2+ edema into thighs; t  Neuro: alert & orientedx3, cranial nerves grossly intact. Moves all 4 extremities w/o difficulty. Affect pleasant  Telemetry: SR  Labs: Basic Metabolic Panel:  Recent Labs Lab 12/13/14 1240 12/14/14 0324  NA 139 140  K 4.0 3.5  CL 99 98  CO2 28 30  GLUCOSE 128* 155*  BUN 43* 41*  CREATININE 1.45* 1.47*  CALCIUM 9.9 9.7  MG 1.8  --     Liver Function Tests:  Recent Labs Lab 12/13/14 1240  AST 28  ALT 26  ALKPHOS 42  BILITOT 0.9  PROT 6.0  ALBUMIN 3.6   No results for input(s): LIPASE, AMYLASE in the last 168 hours. No results for input(s): AMMONIA in the last 168 hours.  CBC:  Recent Labs Lab 12/13/14 1240  WBC 5.8  NEUTROABS 4.4  HGB  9.9*  HCT 31.3*  MCV 92.9  PLT 214    Cardiac Enzymes: No results for input(s): CKTOTAL, CKMB, CKMBINDEX, TROPONINI in the last 168 hours.  BNP: BNP (last 3 results)  Recent Labs  12/13/14 1240  BNP 3083.2*    ProBNP (last 3 results) No results for input(s): PROBNP in the last 8760 hours.    Other results:  EKG:   Imaging: Dg Chest Port 1 View  12/14/2014   CLINICAL DATA:  Dyspnea  EXAM: PORTABLE CHEST - 1 VIEW  COMPARISON:  July 18, 2013  FINDINGS: There is underlying emphysematous change. There is atelectatic change in the left base. There is slight generalized interstitial edema. Lungs elsewhere clear. There is a skin fold on the left but no pneumothorax. There is generalized cardiomegaly with mild pulmonary venous hypertension. Patient is status post aortic and mitral valve replacements. There are pacemaker leads attached to the left ventricle. There is atherosclerotic change in the aorta. There is degenerative change in the thoracic spine. No adenopathy.  IMPRESSION: Suspect a degree of chronic congestive heart failure. Underlying emphysema. No airspace consolidation. Left base atelectasis.   Electronically Signed   By: Bretta Bang III M.D.   On: 12/14/2014 08:09      Medications:     Scheduled Medications: . amLODipine  5  mg Oral Daily  . aspirin EC  81 mg Oral Daily  . calcium-vitamin D  1 tablet Oral Q breakfast  . carvedilol  6.25 mg Oral BID WC  . docusate sodium  100 mg Oral BID  . enoxaparin (LOVENOX) injection  30 mg Subcutaneous Q24H  . ferrous sulfate  325 mg Oral BID WC  . furosemide  80 mg Intravenous BID  . linagliptin  5 mg Oral Daily  . losartan  100 mg Oral Daily  . metolazone  2.5 mg Oral Daily  . simvastatin  20 mg Oral QPM  . sodium chloride  3 mL Intravenous Q12H     Infusions:     PRN Medications:  sodium chloride, acetaminophen, ondansetron (ZOFRAN) IV, sodium chloride   Assessment:   1. Acute on chronic diastolic  CHF: EF improved to 16-10% (07/2013) post-AVR/M 2. Carotid stenosis: Reviewed carotid dopplers; 1-39% LICA, 40-59% RICA, patent vertebral arteries with antegrade flow (11/2013). Due for repeat 3. Aortic stenosis/mitral regurg: s/p bioprosthetic AVR/MVR. Well-seated valves on post-op echo (07/2013).  5. CKD stage 3:  6. HTN: Stable.  7. Post-op AF - no evidence of recurrence.  Plan/Discussion:    Volume status improving. Renal function stable. Continue IV diuresis. Await f/u echo. Place TED hose. Consult CR   Length of Stay: 1 Arvilla Meres  MD  12/14/2014, 9:20 AM  Advanced Heart Failure Team Pager [phone] (M-F; 7a - 4p)  Please contact [facility] for night-coverage after hours (4p -7a ) and weekends on [email or web address]

## 2014-12-14 NOTE — Progress Notes (Signed)
PT Cancellation Note  Patient Details Name: Joanna [name] MRN: [id number] DOB: 1930/06/15   Cancelled Treatment:    Reason Eval/Treat Not Completed: Patient at procedure or test/unavailable Patient currently working with cardiac rehabilitation. Will follow up for PT evaluation as time allows. Likely tomorrow.  Berton Mount 12/14/2014, 3:10 PM [place], [place] [phone]

## 2014-12-14 NOTE — Progress Notes (Signed)
CARDIAC REHAB PHASE I   PRE:  Rate/Rhythm: 83 SR  BP:  Supine:   Sitting: from bathroom  Standing:    SaO2: 95 RA  MODE:  Ambulation: 460 ft   POST:  Rate/Rhythm: 90  BP:  Supine:   Sitting: 94/58  Standing:    SaO2: 98 RA 1445-1525 Assisted X 1 and used walker to ambulate. Gait steady with walker. Pt able to walk 460 feet without c/o of pain or SOB. VS stable pt to recliner after walk with call light in reach.  Melina Copa RN 12/14/2014 3:29 PM

## 2014-12-15 ENCOUNTER — Telehealth (HOSPITAL_COMMUNITY): Payer: Self-pay | Admitting: Vascular Surgery

## 2014-12-15 ENCOUNTER — Emergency Department (HOSPITAL_COMMUNITY)
Admission: EM | Admit: 2014-12-15 | Discharge: 2014-12-15 | Disposition: A | Discharge status: Home / Self Care | Payer: Medicare Other | Attending: Emergency Medicine | Admitting: Emergency Medicine

## 2014-12-15 ENCOUNTER — Encounter (HOSPITAL_COMMUNITY): Payer: Self-pay | Admitting: *Deleted

## 2014-12-15 ENCOUNTER — Telehealth: Payer: Self-pay | Admitting: Internal Medicine

## 2014-12-15 ENCOUNTER — Encounter (HOSPITAL_COMMUNITY): Payer: Self-pay | Admitting: Nurse Practitioner

## 2014-12-15 ENCOUNTER — Emergency Department (HOSPITAL_COMMUNITY): Payer: Medicare Other

## 2014-12-15 DIAGNOSIS — E785 Hyperlipidemia, unspecified: Secondary | ICD-10-CM | POA: Insufficient documentation

## 2014-12-15 DIAGNOSIS — K5900 Constipation, unspecified: Secondary | ICD-10-CM | POA: Insufficient documentation

## 2014-12-15 DIAGNOSIS — I480 Paroxysmal atrial fibrillation: Secondary | ICD-10-CM

## 2014-12-15 DIAGNOSIS — M1990 Unspecified osteoarthritis, unspecified site: Secondary | ICD-10-CM | POA: Insufficient documentation

## 2014-12-15 DIAGNOSIS — Y9389 Activity, other specified: Secondary | ICD-10-CM | POA: Insufficient documentation

## 2014-12-15 DIAGNOSIS — Z8619 Personal history of other infectious and parasitic diseases: Secondary | ICD-10-CM | POA: Insufficient documentation

## 2014-12-15 DIAGNOSIS — Y92410 Unspecified street and highway as the place of occurrence of the external cause: Secondary | ICD-10-CM | POA: Insufficient documentation

## 2014-12-15 DIAGNOSIS — Z79899 Other long term (current) drug therapy: Secondary | ICD-10-CM | POA: Insufficient documentation

## 2014-12-15 DIAGNOSIS — Z8673 Personal history of transient ischemic attack (TIA), and cerebral infarction without residual deficits: Secondary | ICD-10-CM | POA: Insufficient documentation

## 2014-12-15 DIAGNOSIS — Y92810 Car as the place of occurrence of the external cause: Secondary | ICD-10-CM | POA: Insufficient documentation

## 2014-12-15 DIAGNOSIS — S0083XA Contusion of other part of head, initial encounter: Secondary | ICD-10-CM | POA: Insufficient documentation

## 2014-12-15 DIAGNOSIS — Z862 Personal history of diseases of the blood and blood-forming organs and certain disorders involving the immune mechanism: Secondary | ICD-10-CM | POA: Insufficient documentation

## 2014-12-15 DIAGNOSIS — S0990XA Unspecified injury of head, initial encounter: Secondary | ICD-10-CM

## 2014-12-15 DIAGNOSIS — W0110XA Fall on same level from slipping, tripping and stumbling with subsequent striking against unspecified object, initial encounter: Secondary | ICD-10-CM | POA: Insufficient documentation

## 2014-12-15 DIAGNOSIS — Z872 Personal history of diseases of the skin and subcutaneous tissue: Secondary | ICD-10-CM | POA: Insufficient documentation

## 2014-12-15 DIAGNOSIS — E119 Type 2 diabetes mellitus without complications: Secondary | ICD-10-CM | POA: Insufficient documentation

## 2014-12-15 DIAGNOSIS — I5042 Chronic combined systolic (congestive) and diastolic (congestive) heart failure: Secondary | ICD-10-CM | POA: Insufficient documentation

## 2014-12-15 DIAGNOSIS — Y998 Other external cause status: Secondary | ICD-10-CM | POA: Insufficient documentation

## 2014-12-15 DIAGNOSIS — R011 Cardiac murmur, unspecified: Secondary | ICD-10-CM | POA: Insufficient documentation

## 2014-12-15 DIAGNOSIS — I10 Essential (primary) hypertension: Secondary | ICD-10-CM | POA: Insufficient documentation

## 2014-12-15 LAB — BASIC METABOLIC PANEL
Anion gap: 9 (ref 5–15)
BUN: 37 mg/dL — ABNORMAL HIGH (ref 6–23)
CALCIUM: 9.9 mg/dL (ref 8.4–10.5)
CO2: 35 mmol/L — AB (ref 19–32)
CREATININE: 1.72 mg/dL — AB (ref 0.50–1.10)
Chloride: 92 mmol/L — ABNORMAL LOW (ref 96–112)
GFR calc Af Amer: 31 mL/min — ABNORMAL LOW (ref >90)
GFR calc non Af Amer: 26 mL/min — ABNORMAL LOW (ref >90)
GLUCOSE: 177 mg/dL — AB (ref 70–99)
Potassium: 3.5 mmol/L (ref 3.5–5.1)
Sodium: 136 mmol/L (ref 135–145)

## 2014-12-15 MED ORDER — METOLAZONE 2.5 MG PO TABS: 2.5000 mg | ORAL_TABLET | Freq: Every day | ORAL | Status: DC | PRN

## 2014-12-15 MED ORDER — FUROSEMIDE 40 MG PO TABS: 40.0000 mg | ORAL_TABLET | Freq: Two times a day (BID) | ORAL | Status: DC

## 2014-12-15 MED ORDER — FUROSEMIDE 40 MG PO TABS
40.0000 mg | ORAL_TABLET | Freq: Two times a day (BID) | ORAL | Status: DC
  Filled 2014-12-15: qty 1

## 2014-12-15 NOTE — ED Notes (Signed)
Pt reports falling today while getting out of the car.Pt had sob before falling.   Pt denies LOC, pt hit posterior head and hematoma is present. Pt also c/o pain on tailbone.  Pt reports coming home from hospital today from having 7L fluid taken off by lasix.  Pt alert and oriented.

## 2014-12-15 NOTE — Telephone Encounter (Signed)
Pt daughter left message .Marland Kitchen Pt was just released from hospital , pt fell in her drive way hit her head some bruising and no bleeding , she feel on her back and bottom, daughter wants to know should she bring pt back to the hospital.. Please advise

## 2014-12-15 NOTE — ED Provider Notes (Signed)
CSN: [id number]     Arrival date & time 12/15/14  1452 History   First MD Initiated Contact with Patient 12/15/14 1601     Chief Complaint  Patient presents with  . Fall     (Consider location/radiation/quality/duration/timing/severity/associated sxs/prior Treatment) Patient is a 81 y.o. female presenting with fall. The history is provided by the patient and a relative. No language interpreter was used.  Fall Pertinent negatives include no chest pain, fatigue, headaches or numbness.  Joanna [name] presents for trip and fall with head injury that occurred 3 hours ago as she was getting out of her car. She says she has a bump on the top of her head. She denies LOC, seizures, or syncope.  She denies nausea, diaphoresis, shortness or breath, dizziness, or chest pain prior to fall. She denies any back, hip, or pelvis pain. She also denies and bleeding. She was able to ambulate after the incident with no associated symptoms. She did not have any pain medications at home prior to arrival.  Her daughter called her primary care physician who advised her to come to the hospital.  She was just discharged from the hospital for CHF prior to falling today.   Past Medical History  Diagnosis Date  . Peripheral artery disease   . Hyperlipidemia     takes Simvastatin daily  . PONV (postoperative nausea and vomiting)   . Shortness of breath   . Arthritis   . Aortic stenosis 09/21/2011  . AORTIC VALVE SCLEROSIS 02/21/2010  . 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 BUNDLE BRANCH BLOCK, LEFT 07/30/2007  . CAROTID ARTERY STENOSIS 01/05/2009  . CVA 03/14/2010  . 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 PERIPHERAL VASCULAR DISEASE 07/30/2007  . POLYPECTOMY, HX OF 07/30/2007  . Constipation   . Chronic combined systolic and diastolic CHF (congestive heart failure)     a. RHC 09/2011 RA 8, RV 58/2/11; PA 54/22 (37) PCWP 20; F CO/CI 4.57/2.67 PVR 3.7; b. L main no sig dz, LAD luminal irreg; LCx lg Ramus with luminal irreg, small AV  Lcx witout sig dz, RCA luminal irreg; severe mitral annular calcifications; AV calcified c. EF 45-50% (07/2013);  d. 11/2014 Echo: EF 20-25%, diff HK, nl AV/MV, sev dil LA, mod TR/PR, PASP .  . Diabetes mellitus   . DM 07/30/2007  . HTN (hypertension)   . HYPERTENSION 07/30/2007  . Weakness   . Dizziness   . Joint pain   . Itching   . Anemia   . Iron deficiency anemia 09/21/2011  . Nocturia   . History of blood transfusion   . History of shingles   . Mitral regurgitation   . S/P aortic valve replacement with bioprosthetic valve 06/15/2013    21 mm [facility] Ease bovine pericardial tissue valve  . S/P mitral valve replacement with bioprosthetic valve 06/15/2013    25 mm [facility] Mitral bovine pericardial tissue valve   Past Surgical History  Procedure Laterality Date  . Carotid endarterectomy Left 2011  . Polypectomy  12/27/04  . Cataract extraction Bilateral 2009  . Tee without cardioversion N/A 05/19/2013    Procedure: TRANSESOPHAGEAL ECHOCARDIOGRAM (TEE);  Surgeon: Laurey Morale, MD;  Location: [facility] ENDOSCOPY;  Service: Cardiovascular;  Laterality: N/A;  . Colonoscopy    . Cardiac catheterization  2012  . Aortic valve replacement N/A 06/15/2013    Procedure: AORTIC VALVE REPLACEMENT (AVR);  Surgeon: Purcell Nails, MD;  Location: [facility] OR;  Service: Open Heart Surgery;  Laterality: N/A;  . Intraoperative transesophageal echocardiogram  N/A 06/15/2013    Procedure: INTRAOPERATIVE TRANSESOPHAGEAL ECHOCARDIOGRAM;  Surgeon: Purcell Nails, MD;  Location: [facility] OR;  Service: Open Heart Surgery;  Laterality: N/A;  . Mitral valve replacement N/A 06/15/2013    Procedure: MITRAL VALVE (MV) REPLACEMENT;  Surgeon: Purcell Nails, MD;  Location: [facility] OR;  Service: Open Heart Surgery;  Laterality: N/A;  . Epicardial pacing lead placement N/A 06/15/2013    Procedure: EPICARDIAL PACING LEAD PLACEMENT;  Surgeon: Purcell Nails, MD;  Location: [facility] OR;  Service: Open Heart Surgery;  Laterality:  N/A;  . Left and right heart catheterization with coronary angiogram N/A 09/19/2011    Procedure: LEFT AND RIGHT HEART CATHETERIZATION WITH CORONARY ANGIOGRAM;  Surgeon: Dolores Patty, MD;  Location: [facility] CATH LAB;  Service: Cardiovascular;  Laterality: N/A;   Family History  Problem Relation Age of Onset  . Colon cancer Mother   . Diabetes Neg Hx   . Coronary artery disease Neg Hx    History  Substance Use Topics  . Smoking status: Never Smoker   . Smokeless tobacco: Never Used  . Alcohol Use: No   OB History    No data available     Review of Systems  Constitutional: Negative for fatigue.  Respiratory: Negative for shortness of breath.   Cardiovascular: Negative for chest pain.  Genitourinary: Negative for pelvic pain.  Neurological: Negative for dizziness, syncope, light-headedness, numbness and headaches.  No bleeding.   Allergies  Other and Prednisone  Home Medications   Prior to Admission medications   Medication Sig Start Date End Date Taking? Authorizing Provider  aspirin EC 81 MG tablet Take 1 tablet (81 mg total) by mouth daily. Patient not taking: Reported on 12/15/2014 07/10/14   Dolores Patty, MD  calcium citrate-vitamin D (CITRACAL+D) 315-200 MG-UNIT per tablet Take 1 tablet by mouth daily.    Historical Provider, MD  carvedilol (COREG) 6.25 MG tablet TAKE 1 TABLET BY MOUTH TWICE A DAY WITH MEALS Patient taking differently: Take 6.25 mg by mouth 2 (two) times daily with a meal. TAKE 1 TABLET BY MOUTH TWICE A DAY WITH MEALS 02/28/14   Etta Grandchild, MD  docusate sodium (COLACE) 100 MG capsule Take 100 mg by mouth 2 (two) times daily.      Historical Provider, MD  ferrous sulfate 325 (65 FE) MG tablet Take 1 tablet (325 mg total) by mouth 2 (two) times daily with a meal. Patient not taking: Reported on 12/15/2014 02/28/14   Etta Grandchild, MD  furosemide (LASIX) 40 MG tablet Take 1 tablet (40 mg total) by mouth 2 (two) times daily. 12/15/14   Ok Anis, NP  losartan (COZAAR) 100 MG tablet Take 1 tablet by mouth  daily Patient taking differently: Take 100 mg by mouth daily. Take 1 tablet by mouth  daily 02/28/14   Etta Grandchild, MD  metolazone (ZAROXOLYN) 2.5 MG tablet Take 1 tablet (2.5 mg total) by mouth daily as needed (For more than 3 pound weight gain in 1 day or 5 pounds in a week). Patient not taking: Reported on 12/15/2014 12/15/14   Ok Anis, NP  simvastatin (ZOCOR) 20 MG tablet Take 1 tablet (20 mg total) by mouth every evening. 02/28/14   Etta Grandchild, MD  sitaGLIPtin (JANUVIA) 50 MG tablet Take 1 tablet (50 mg total) by mouth daily. 02/28/14   Etta Grandchild, MD   BP 113/50 mmHg  Pulse 87  Temp(Src) 98.9 F (37.2 C) (Oral)  Resp  18  SpO2 95% Physical Exam  Constitutional: She appears well-developed and well-nourished.  HENT:  Head: Head is with contusion.    Cardiovascular: Normal rate and regular rhythm.   Murmur heard. Pulmonary/Chest: No respiratory distress.  Left lower lung base rales.   ED Course  Procedures (including critical care time) Labs Review Labs Reviewed - No data to display  Imaging Review Ct Head Wo Contrast  12/15/2014   CLINICAL DATA:  Fall.  Head injury.  On blood thinner.  EXAM: CT HEAD WITHOUT CONTRAST  TECHNIQUE: Contiguous axial images were obtained from the base of the skull through the vertex without intravenous contrast.  COMPARISON:  None.  FINDINGS: Moderate atrophy. Mild chronic microvascular ischemic change in the white matter. Negative for hydrocephalus.  Negative for intracranial hemorrhage.  No acute infarct or mass  Right convexity scalp hematoma.  Negative for  skull fracture.  Atherosclerotic calcification.  IMPRESSION: Atrophy and chronic microvascular ischemia. No acute intracranial abnormality.   Electronically Signed   By: Marlan Palau M.D.   On: 12/15/2014 16:47   Dg Chest Port 1 View  12/14/2014   CLINICAL DATA:  Dyspnea  EXAM: PORTABLE CHEST - 1 VIEW   COMPARISON:  July 18, 2013  FINDINGS: There is underlying emphysematous change. There is atelectatic change in the left base. There is slight generalized interstitial edema. Lungs elsewhere clear. There is a skin fold on the left but no pneumothorax. There is generalized cardiomegaly with mild pulmonary venous hypertension. Patient is status post aortic and mitral valve replacements. There are pacemaker leads attached to the left ventricle. There is atherosclerotic change in the aorta. There is degenerative change in the thoracic spine. No adenopathy.  IMPRESSION: Suspect a degree of chronic congestive heart failure. Underlying emphysema. No airspace consolidation. Left base atelectasis.   Electronically Signed   By: Bretta Bang III M.D.   On: 12/14/2014 08:09     EKG Interpretation None      MDM  Explained reasons to return: confusion, decreased mental status, chest pain, and dizziness. Daughter states she does not want pain meds and will give her tylenol at home.  Final diagnoses:  Head injuries, initial encounter       Catha Gosselin, PA-C 12/15/14 1725  Vida Roller, MD 12/15/14 [phone]

## 2014-12-15 NOTE — Telephone Encounter (Signed)
EASE NOTE: All timestamps contained within this report are represented as [place] Standard Time. CONFIDENTIALTY NOTICE: This fax transmission is intended only for the addressee. It contains information that is legally privileged, confidential or otherwise protected from use or disclosure. If you are not the intended recipient, you are strictly prohibited from reviewing, disclosing, copying using or disseminating any of this information or taking any action in reliance on or regarding this information. If you have received this fax in error, please notify us immediately by telephone so that we can arrange for its return to [place]. Phone: [phone], Toll-Free: [phone], Fax: [phone] Page: 1 of 1 Call Id: [id number] [facility] - Client TELEPHONE ADVICE RECORD [facility] Patient Name: Joanna [name] DOB: September 11, 1930 Initial Comment Caller States mother was released from hospital today. when walking into the house she fell and hit and her head on the concrete. there is only a small bump, in that area the blood has come to the surface but she is not bleeding. put ice on the injury. no other symptoms. wants to know if she should take her to the ED to be seen. Nurse Assessment Guidelines Guideline Title Affirmed Question Affirmed Notes Head Injury [1] Large swelling AND [2] size > palm of person's hand Final Disposition User Go to ED Now Chrys Racer, RN, Alexia Freestone

## 2014-12-15 NOTE — ED Provider Notes (Signed)
The patient is an 81 year old female, she has a history of congestive heart failure, recently seen and admitted to the hospital for 48 hours for the same. She was discharged from the hospital today, upon leaving the patient tried to get out of her car at home tripping on the edge of the concrete sidewalk, falling backwards and striking the back of her head on the ground. She denies loss of consciousness, the family member states that she had no seizures nausea vomiting, was assisted to her feet and was able to ambulate with minimal difficulty. She denies any significant headache at this time, there is no nausea, no visual changes and on exam the patient has normal neurovascular function   All other systems reviewed and are negative.  General, no acute distress HEENT, hematoma to the posterior occiput Eye, normal extraocular movements, normal pupillary exam, normal conjunctiva  Neck, normal range of motion, supple, no lymphadenopathy, no posterior tenderness  Cardiovascular, normal rate and rhythm, soft systolic murmur, strong pulses at the radial arteries, no JVD  Pulmonary, no increased work of breathing, speaks in full sentences, mild rales at the bases  Abdomen, no tenderness, no distention, no guarding  Extremities, scant bilateral symmetrical peripheral edema, joints are supple, compartments are soft Neurologic, will talk show me 4, follows commands, cranial nerves III through XII intact, coordination intact, normal memory.  Skin, other than hematoma to the posterior occiput, no other skin injuries   CT scan reviewed, no signs of intracranial hemorrhage or fracture, patient will be informed of these findings prior to discharge. She can go home in the care of her family member to rest at home for the next 2 days with concussion protocol.  Medical screening examination/treatment/procedure(s) were conducted as a shared visit with non-physician practitioner(s) and myself.  I personally evaluated  the patient during the encounter.  Clinical Impression:   Final diagnoses:  Head injuries, initial encounter         Vida Roller, MD 12/15/14 2327

## 2014-12-15 NOTE — ED Notes (Signed)
Pt made aware to return if symptoms worsen or if any life threatening symptoms occur.   

## 2014-12-15 NOTE — Progress Notes (Signed)
Advanced Heart Failure Rounding Note   Subjective:     Diuresing well. Weight down 4 pounds overnight. Total 7 pounds.Back to baseline.  Breathing ok. Creatinine up slightly.  Echo with EF down to 20-25%  (had recovered to 50-55% in 10/14)    Objective:   Weight Range:  Vital Signs:   Temp:  [97.7 F (36.5 C)-98.2 F (36.8 C)] 97.7 F (36.5 C) (02/26 0443) Pulse Rate:  [77-88] 88 (02/26 0443) Resp:  [19-20] 20 (02/26 0443) BP: (87-127)/(32-64) 127/64 mmHg (02/26 0443) SpO2:  [92 %-98 %] 93 % (02/26 0443) Weight:  [58.2 kg (128 lb 4.9 oz)] 58.2 kg (128 lb 4.9 oz) (02/26 0443) Last BM Date: 12/14/14  Weight change: Filed Weights   12/13/14 1137 12/14/14 0629 12/15/14 0443  Weight: 61.417 kg (135 lb 6.4 oz) 60.2 kg (132 lb 11.5 oz) 58.2 kg (128 lb 4.9 oz)   Scheduled Meds: . amLODipine  5 mg Oral Daily  . aspirin EC  81 mg Oral Daily  . calcium-vitamin D  1 tablet Oral Q breakfast  . carvedilol  6.25 mg Oral BID WC  . docusate sodium  100 mg Oral BID  . enoxaparin (LOVENOX) injection  30 mg Subcutaneous Q24H  . ferrous sulfate  325 mg Oral BID WC  . furosemide  80 mg Intravenous BID  . linagliptin  5 mg Oral Daily  . losartan  100 mg Oral Daily  . metolazone  2.5 mg Oral Daily  . simvastatin  20 mg Oral QPM  . sodium chloride  3 mL Intravenous Q12H   Continuous Infusions:  PRN Meds:.sodium chloride, acetaminophen, ondansetron (ZOFRAN) IV, sodium chloride   Intake/Output:   Intake/Output Summary (Last 24 hours) at 12/15/14 0839 Last data filed at 12/15/14 9811  Gross per 24 hour  Intake   2343 ml  Output   4750 ml  Net  -2407 ml     PHYSICAL EXAM: General: Elderly. NAD; HEENT: normal Neck: supple. JVP 5-6 with prominent CV waves. Carotids 2+ bilaterally; no bruits. No lymphadenopathy or thryomegaly appreciated. Cor: PMI normal. Regular rate & rhythm. 2/6 SEM.  Lungs: CTA  Abdomen: soft, nontender, nondistended. No hepatosplenomegaly. No bruits or  masses. Good bowel sounds. Extremities: no cyanosis, clubbing, rash, no edema Neuro: alert & orientedx3, cranial nerves grossly intact. Moves all 4 extremities w/o difficulty. Affect pleasant  Telemetry: SR  Labs: Basic Metabolic Panel:  Recent Labs Lab 12/13/14 1240 12/14/14 0324 12/15/14 0451  NA 139 140 136  K 4.0 3.5 3.5  CL 99 98 92*  CO2 28 30 35*  GLUCOSE 128* 155* 177*  BUN 43* 41* 37*  CREATININE 1.45* 1.47* 1.72*  CALCIUM 9.9 9.7 9.9  MG 1.8  --   --     Liver Function Tests:  Recent Labs Lab 12/13/14 1240  AST 28  ALT 26  ALKPHOS 42  BILITOT 0.9  PROT 6.0  ALBUMIN 3.6   No results for input(s): LIPASE, AMYLASE in the last 168 hours. No results for input(s): AMMONIA in the last 168 hours.  CBC:  Recent Labs Lab 12/13/14 1240  WBC 5.8  NEUTROABS 4.4  HGB 9.9*  HCT 31.3*  MCV 92.9  PLT 214    Cardiac Enzymes: No results for input(s): CKTOTAL, CKMB, CKMBINDEX, TROPONINI in the last 168 hours.  BNP: BNP (last 3 results)  Recent Labs  12/13/14 1240  BNP 3083.2*    ProBNP (last 3 results) No results for input(s): PROBNP in the last 8760 hours.  Other results:  EKG:   Imaging: Dg Chest Port 1 View  12/14/2014   CLINICAL DATA:  Dyspnea  EXAM: PORTABLE CHEST - 1 VIEW  COMPARISON:  July 18, 2013  FINDINGS: There is underlying emphysematous change. There is atelectatic change in the left base. There is slight generalized interstitial edema. Lungs elsewhere clear. There is a skin fold on the left but no pneumothorax. There is generalized cardiomegaly with mild pulmonary venous hypertension. Patient is status post aortic and mitral valve replacements. There are pacemaker leads attached to the left ventricle. There is atherosclerotic change in the aorta. There is degenerative change in the thoracic spine. No adenopathy.  IMPRESSION: Suspect a degree of chronic congestive heart failure. Underlying emphysema. No airspace consolidation.  Left base atelectasis.   Electronically Signed   By: Bretta Bang III M.D.   On: 12/14/2014 08:09     Medications:     Scheduled Medications: . amLODipine  5 mg Oral Daily  . aspirin EC  81 mg Oral Daily  . calcium-vitamin D  1 tablet Oral Q breakfast  . carvedilol  6.25 mg Oral BID WC  . docusate sodium  100 mg Oral BID  . enoxaparin (LOVENOX) injection  30 mg Subcutaneous Q24H  . ferrous sulfate  325 mg Oral BID WC  . furosemide  80 mg Intravenous BID  . linagliptin  5 mg Oral Daily  . losartan  100 mg Oral Daily  . metolazone  2.5 mg Oral Daily  . simvastatin  20 mg Oral QPM  . sodium chloride  3 mL Intravenous Q12H    Infusions:    PRN Medications: sodium chloride, acetaminophen, ondansetron (ZOFRAN) IV, sodium chloride   Assessment:   1. Acute on chronic systolic CHF: EF improved to 50-55% (07/2013) post-AVR/M 2. Carotid stenosis: Reviewed carotid dopplers; 1-39% LICA, 40-59% RICA, patent vertebral arteries with antegrade flow (11/2013). Due for repeat 3. Aortic stenosis/mitral regurg: s/p bioprosthetic AVR/MVR. Well-seated valves on post-op echo (07/2013).  5. CKD stage 3:  6. HTN: Stable.  7. Post-op AF - no evidence of recurrence.  Plan/Discussion:    Volume status much improved. Unfortunately EF back down. I discussed with her and her daughter.  Can go home today on previous meds with the exception of increase lasix to 40 bid. Stop amlodipine.   F/u in HF Clinic next week. Can use metolazone 2.5 prn for weight gain.   Length of Stay: 2 Joanna Meres  MD  12/15/2014, 8:39 AM  Advanced Heart Failure Team Pager [phone] (M-F; 7a - 4p)  Please contact [facility] for night-coverage after hours (4p -7a ) and weekends on [email or web address]

## 2014-12-15 NOTE — Telephone Encounter (Signed)
Spoke w/Barbara, she states she is bringing pt to ER now for eval.  She states when they got home for hospital and pt got out of the car and her knee gave out on her, she feel on her bottom and hit the back of her head on the concrete driveway, she states there is a big know on the back of pt's head so she is bringing her in for eval, Dr Gala Romney is aware

## 2014-12-15 NOTE — Progress Notes (Signed)
Discussed HF booklet with pt and daughter. Reinforced low sodium, daily wts, walking daily, when to call MD. Voiced understanding.  [id number] Ethelda Chick CES, ACSM 11:44 AM 12/15/2014

## 2014-12-15 NOTE — Discharge Summary (Signed)
Discharge Summary   Patient ID: Joanna [name],  MRN: [id number], DOB/AGE: 12/05/1929 81 y.o.  Admit date: 12/13/2014 Discharge date: 12/15/2014  Primary Care Provider: Sanda Linger Primary Cardiologist: D. Bensimhon, MD   Discharge Diagnoses Principal Problem:   Acute on chronic combined systolic and diastolic CHF, NYHA class 3  **Net negative diuresis of 4.7 L this admission with reduction in weight from 135 lbs on admission to 128 lbs at discharge.  Active Problems:   Essential hypertension   S/P aortic and mitral valve replacement with bioprosthetic valves   CKD (chronic kidney disease) stage 3, GFR 30-59 ml/min   Type II diabetes mellitus   Hyperlipidemia with target LDL less than 100   Carotid arterial disease   Iron deficiency anemia   PAF (paroxysmal atrial fibrillation)   Allergies Allergies  Allergen Reactions  . Other Hives    STEROIDS  . Prednisone Hives and Other (See Comments)    She is allergic to all steroids!    Procedures  2D Echocardiogram 2.25.2016  Study Conclusions  - Left ventricle: The cavity size was moderately dilated. There was   mild concentric hypertrophy. Systolic function was severely   reduced. The estimated ejection fraction was in the range of 20%   to 25%. Diffuse hypokinesis. LV false tendon seen on prior study   noted . There is a very small freely mobile ill defined linear   density which appears to be attached to false tendon. Can not   exclude small linear thrombus however density of structure   appears calcified which makes this less likely. - Aortic valve: A bioprosthesis was present, well seated and   functioning normally. There was trivial regurgitation. - Mitral valve: A bioprosthesis was present, well seated and   functioning normally. - Left atrium: The atrium was severely dilated. - Right ventricle: The cavity size was mildly dilated. Wall   thickness was normal. Systolic function was mildly reduced. - Tricuspid  valve: There was moderate regurgitation. - Pulmonic valve: There was moderate regurgitation. - Pulmonary arteries: Systolic pressure was moderately increased.   PA peak pressure: 54 mm Hg (S). _____________   History of Present Illness  81 y/o female with a h/o aortic stenosis and mitral regurgitation s/p bioprosthetic AVR and MVR in 05/2013.  She also has a h/o NICM with EF previously as low as 30% but with subsequent recovery of LV function by echo in 2014.  She was seen in CHF clinic on 2/24 with complaints of severe lower extremity edema and weight gain over the preceding 3 wks.  Decision was made to admit her for IV diuresis.  Hospital Course  Following admission, Ms. Ricklefs was placed on IV lasix and metolazone.  She responded well and for this admission, she has diuresed 4.7 liters associated with a 7 lbs wt loss.  With this, she has had clinical improvement.  Echocardiography was carried out on 2/25 revealing an EF of 20-25% with normally functioning bioprosthetic aortic and mitral valves.  Reduced LV function was new since 2014 and she has been maintained on beta blocker and ARB therapy. She is felt to be stable for discharge this AM.  Her home dose of lasix has been increased to 40 mg BID and we have also added prn metolazone for weight gain.  She will f/u in CHF clinic in 1 week.  Of note, in the setting of diuresis, she did bump her creatinine to 1.72 (bicarb rose to 35).  We will plan to f/u a  bmet upon f/u next week.  Discharge Vitals Blood pressure 102/58, pulse 72, temperature 97.7 F (36.5 C), temperature source Oral, resp. rate 20, height  (1.626 m), weight 128 lb 4.9 oz (58.2 kg), SpO2 96 %.  Filed Weights   12/13/14 1137 12/14/14 0629 12/15/14 0443  Weight: 135 lb 6.4 oz (61.417 kg) 132 lb 11.5 oz (60.2 kg) 128 lb 4.9 oz (58.2 kg)    Labs  CBC  Recent Labs  12/13/14 1240  WBC 5.8  NEUTROABS 4.4  HGB 9.9*  HCT 31.3*  MCV 92.9  PLT 214   Basic Metabolic  Panel  Recent Labs  16/10/96 1240 12/14/14 0324 12/15/14 0451  NA 139 140 136  K 4.0 3.5 3.5  CL 99 98 92*  CO2 28 30 35*  GLUCOSE 128* 155* 177*  BUN 43* 41* 37*  CREATININE 1.45* 1.47* 1.72*  CALCIUM 9.9 9.7 9.9  MG 1.8  --   --    Liver Function Tests  Recent Labs  12/13/14 1240  AST 28  ALT 26  ALKPHOS 42  BILITOT 0.9  PROT 6.0  ALBUMIN 3.6   Thyroid Function Tests  Recent Labs  12/13/14 1240  TSH 2.010    Disposition  Pt is being discharged home today in good condition.  Follow-up Plans & Appointments  Follow-up Information    Follow up with Arvilla Meres, MD On 12/21/2014.   Specialty:  Cardiology   Why:  2:30 PM   Contact information:   [address] [place] [address] [phone]       Follow up with Sanda Linger, MD.   Specialty:  Internal Medicine   Why:  as scheduled.    Contact information:   [address] [place] [address] [phone]       Discharge Medications    Medication List    STOP taking these medications        amLODipine 5 MG tablet  Commonly known as:  NORVASC     metFORMIN 1000 MG tablet  Commonly known as:  GLUCOPHAGE      TAKE these medications        aspirin EC 81 MG tablet  Take 1 tablet (81 mg total) by mouth daily.     calcium citrate-vitamin D 315-200 MG-UNIT per tablet  Commonly known as:  CITRACAL+D  Take 1 tablet by mouth daily.     carvedilol 6.25 MG tablet  Commonly known as:  COREG  TAKE 1 TABLET BY MOUTH TWICE A DAY WITH MEALS     docusate sodium 100 MG capsule  Commonly known as:  COLACE  Take 100 mg by mouth 2 (two) times daily.     ferrous sulfate 325 (65 FE) MG tablet  Take 1 tablet (325 mg total) by mouth 2 (two) times daily with a meal.     furosemide 40 MG tablet  Commonly known as:  LASIX  Take 1 tablet (40 mg total) by mouth 2 (two) times daily.     losartan 100 MG tablet  Commonly known as:  COZAAR  Take 1 tablet by mouth   daily     metolazone 2.5 MG tablet  Commonly known as:  ZAROXOLYN  Take 1 tablet (2.5 mg total) by mouth daily as needed (For more than 3 pound weight gain in 1 day or 5 pounds in a week).     simvastatin 20 MG tablet  Commonly known as:  ZOCOR  Take 1  tablet (20 mg total) by mouth every evening.     sitaGLIPtin 50 MG tablet  Commonly known as:  JANUVIA  Take 1 tablet (50 mg total) by mouth daily.       Outstanding Labs/Studies  BMET upon office f/u.  Duration of Discharge Encounter   Greater than 30 minutes including physician time.  Signed, Nicolasa Ducking NP 12/15/2014, 10:05 AM  Patient seen and examined independently. Gilford Raid, NP note reviewed carefully - agree with his assessment and plan. I have edited the note based on my findings. She is improved after diuresis. Ok for d/c home with close f/u in HF Clinic.   Truman Hayward 5:19 PM

## 2014-12-15 NOTE — Evaluation (Signed)
Physical Therapy Evaluation Patient Details Name: Joanna [name] MRN: [id number] DOB: [birth year] Today's Date: 12/15/2014   History of Present Illness  Joanna [name] is a 81 year old woman with aortic stenosis s/p AVR/mitral valve replacement (06/15/2013), systolic HF due to NICM, hypertension, diabetes mellitus type II, hyperlipidemia, and peripheral artery disease. Underwent AVR/MVR with placement of epicardial lead on 06/15/13 with PAF. Pt admitted secondary to CHF.  Clinical Impression  Pt is mod. Independent with RW on the unit. Pt given a HEP for LE strengthening. Pt is currently receiving cardiac rehab and recommend continuing until d/c home. Pt does not have a skilled PT need and is d/c from PT services. Pt is agreeable to continue exercise program independently. Pt is safe to d/c home alone using RW.      Follow Up Recommendations Other (comment);No PT follow up (continue with cardiac rehab)    Equipment Recommendations  None recommended by PT    Recommendations for Other Services  (continue with cardiac rehab)     Precautions / Restrictions Restrictions Weight Bearing Restrictions: No      Mobility  Bed Mobility Overal bed mobility: Modified Independent                Transfers Overall transfer level: Modified independent Equipment used: Rolling walker (2 wheeled)                Ambulation/Gait Ambulation/Gait assistance: Modified independent (Device/Increase time) Ambulation Distance (Feet): 200 Feet Assistive device: Rolling walker (2 wheeled) Gait Pattern/deviations: Step-through pattern;Decreased stride length Gait velocity: decreased   General Gait Details: cues for walker placement and upright posture. Pt was able to tolerate moderate challenges to balance with RW mod. Independently on the unit.  Stairs            Wheelchair Mobility    Modified Rankin (Stroke Patients Only)       Balance Overall balance assessment: Needs assistance    Sitting balance-Leahy Scale: Normal     Standing balance support: Bilateral upper extremity supported Standing balance-Leahy Scale: Good                               Pertinent Vitals/Pain Pain Assessment: No/denies pain    Home Living Family/patient expects to be discharged to:: Private residence Living Arrangements: Alone Available Help at Discharge: Family;Available PRN/intermittently Type of Home: House Home Access: Stairs to enter Entrance Stairs-Rails: Right;Left;Can reach both Entrance Stairs-Number of Steps: 1 Home Layout: One level Home Equipment: Walker - 2 wheels;Cane - single point      Prior Function Level of Independence: Independent with assistive device(s)               Hand Dominance        Extremity/Trunk Assessment   Upper Extremity Assessment: Defer to OT evaluation           Lower Extremity Assessment: Overall WFL for tasks assessed      Cervical / Trunk Assessment: Normal  Communication   Communication: No difficulties  Cognition Arousal/Alertness: Awake/alert Behavior During Therapy: WFL for tasks assessed/performed Overall Cognitive Status: Within Functional Limits for tasks assessed                      General Comments      Exercises General Exercises - Lower Extremity Hip ABduction/ADduction: AROM;Strengthening;Both;10 reps;Standing Hip Flexion/Marching: AROM;Both;10 reps;Standing Heel Raises: AROM;Both;10 reps;Standing Mini-Sqauts: AROM;Strengthening;10 reps;Standing      Assessment/Plan  PT Assessment Patent does not need any further PT services  PT Diagnosis     PT Problem List    PT Treatment Interventions     PT Goals (Current goals can be found in the Care Plan section)      Frequency     Barriers to discharge        Co-evaluation               End of Session Equipment Utilized During Treatment: Gait belt Activity Tolerance: Patient tolerated treatment well Patient  left: in bed;with call bell/phone within reach Nurse Communication: Mobility status (Pt is mod. Independent with RW)         Time: 1610-9604 PT Time Calculation (min) (ACUTE ONLY): 25 min   Charges:   PT Evaluation $Initial PT Evaluation Tier I: 1 Procedure PT Treatments $Therapeutic Exercise: 8-22 mins   PT G Codes:        Joanna [name] 12/15/2014, 8:21 AM

## 2014-12-15 NOTE — Discharge Instructions (Signed)
***  PLEASE REMEMBER TO BRING ALL OF YOUR MEDICATIONS TO EACH OF YOUR FOLLOW-UP OFFICE VISITS.  

## 2014-12-15 NOTE — ED Notes (Signed)
Pt reports just being dc from hospital this am and had fall in driveway when she got home. Pt reports landing on her bottom, having pain to buttock and hematoma to back of head, denies loc.

## 2014-12-15 NOTE — Discharge Instructions (Signed)
Head Injury  You have a head injury. Headaches and throwing up (vomiting) are common after a head injury. It should be easy to wake up from sleeping. Sometimes you must stay in the hospital. Most problems happen within the first 24 hours. Side effects may occur up to 7-10 days after the injury.   WHAT ARE THE TYPES OF HEAD INJURIES?  Head injuries can be as minor as a bump. Some head injuries can be more severe. More severe head injuries include:  · A jarring injury to the brain (concussion).  · A bruise of the brain (contusion). This mean there is bleeding in the brain that can cause swelling.  · A cracked skull (skull fracture).  · Bleeding in the brain that collects, clots, and forms a bump (hematoma).  WHEN SHOULD I GET HELP RIGHT AWAY?   · You are confused or sleepy.  · You cannot be woken up.  · You feel sick to your stomach (nauseous) or keep throwing up (vomiting).  · Your dizziness or unsteadiness is getting worse.  · You have very bad, lasting headaches that are not helped by medicine. Take medicines only as told by your doctor.  · You cannot use your arms or legs like normal.  · You cannot walk.  · You notice changes in the black spots in the center of the colored part of your eye (pupil).  · You have clear or bloody fluid coming from your nose or ears.  · You have trouble seeing.  During the next 24 hours after the injury, you must stay with someone who can watch you. This person should get help right away (call 911 in the [place] if you start to shake and are not able to control it (have seizures), you pass out, or you are unable to wake up.  HOW CAN I PREVENT A HEAD INJURY IN THE FUTURE?  · Wear seat belts.  · Wear a helmet while bike riding and playing sports like football.  · Stay away from dangerous activities around the house.  WHEN CAN I RETURN TO NORMAL ACTIVITIES AND ATHLETICS?  See your doctor before doing these activities. You should not do normal activities or play contact sports until 1 week  after the following symptoms have stopped:  · Headache that does not go away.  · Dizziness.  · Poor attention.  · Confusion.  · Memory problems.  · Sickness to your stomach or throwing up.  · Tiredness.  · Fussiness.  · Bothered by bright lights or loud noises.  · Anxiousness or depression.  · Restless sleep.  MAKE SURE YOU:   · Understand these instructions.  · Will watch your condition.  · Will get help right away if you are not doing well or get worse.  Document Released: 09/18/2008 Document Revised: 02/20/2014 Document Reviewed: 06/13/2013  [employer]® Patient Information ©2015 [employer]. This information is not intended to replace advice given to you by your health care provider. Make sure you discuss any questions you have with your health care provider.

## 2014-12-15 NOTE — Progress Notes (Signed)
Chaplain initiated visit while rounding.  Patient reports she will be discharged today and is very happy that her swelling is down and the amount of fluid has decreased.  Daughter will be coming to pick her up.  Prayed with patient including thanksgiving for her progress and for restored health. Lillie Fragmin Chaplain Intern 9:54 AM 12/15/2014

## 2014-12-19 ENCOUNTER — Telehealth: Payer: Self-pay | Admitting: *Deleted

## 2014-12-19 NOTE — Telephone Encounter (Signed)
[facility] - Client TELEPHONE ADVICE RECORD [facility] Patient Name: Joanna [name] Gender: Female DOB: 1930/03/09 Age: 81 Y 4 M 20 D Return Phone Number: [phone] (Primary), [phone] (Secondary) Address: [address] City/State/Zip: [place] [address] Client [facility] - Client Client Site  [facility] Day Physician Sanda Linger Contact Type Call Call Type Triage / Clinical Caller Name [name] Relationship To Patient Daughter Appointment Disposition EMR Appointment Not Necessary Return Phone Number [phone] (Primary) Chief Complaint Head Injury (non urgent symptom) Initial Comment Caller States mother was released from hospital today. when walking into the house she fell and hit and her head on the concrete. there is only a small bump, in that area the blood has come to the surface but she is not bleeding. put ice on the injury. no other symptoms. wants to know if she should take her to the ED to be seen. PreDisposition InappropriateToAsk Info pasted into Epic Yes Nurse Assessment Guidelines Guideline Title Affirmed Question Affirmed Notes Nurse Date/Time (Eastern Time) Head Injury [1] Large swelling AND [2] size > palm of person's hand Dianna Limbo, Alexia Freestone 12/15/2014 2:20:33 PM Disp. Time Lamount Cohen Time) Disposition Final User 12/15/2014 2:24:49 PM Go to ED Now Yes Chrys Racer, RN, Alexia Freestone Caller Understands: Yes Disagree/Comply: Comply Care Advice Given Per Guideline GO TO ED NOW: You need to be seen in the Emergency Department. Go to the ER at ___________ Hospital. Leave now. Drive carefully. BLEEDING: If there is a scrape or cut, wash it off with soap and water. Then apply pressure with a sterile gauze for 10 minutes to stop any bleeding. NOTHING BY MOUTH: Do not allow any eating or drinking. Also avoid pain medicines until seen. (Reason: condition may need surgery and  general anesthesia) CARE ADVICE given per Head Injury (Adult) guideline. After Care Instructions Given Call Event Type User Date / Time Description PLEASE NOTE: All timestamps contained within this report are represented as [place] Standard Time. CONFIDENTIALTY NOTICE: This fax transmission is intended only for the addressee. It contains information that is legally privileged, confidential or otherwise protected from use or disclosure. If you are not the intended recipient, you are strictly prohibited from reviewing, disclosing, copying using or disseminating any of this information or taking any action in reliance on or regarding this information. If you have received this fax in error, please notify us immediately by telephone so that we can arrange for its return to [place]. Phone: [phone], Toll-Free: [phone], Fax: [phone] Page: 2 of 2 Call Id: [id number] Referrals [facility] - ED

## 2014-12-20 ENCOUNTER — Telehealth: Payer: Self-pay | Admitting: Internal Medicine

## 2014-12-20 ENCOUNTER — Inpatient Hospital Stay: Payer: Medicare Other | Admitting: Internal Medicine

## 2014-12-20 NOTE — Telephone Encounter (Signed)
Pt miss the appt today, do we need to call and rs pt?

## 2014-12-21 ENCOUNTER — Ambulatory Visit (HOSPITAL_COMMUNITY)
Admission: RE | Admit: 2014-12-21 | Discharge: 2014-12-21 | Disposition: A | Discharge status: Home / Self Care | Payer: Medicare Other | Source: Ambulatory Visit | Attending: Cardiology | Admitting: Cardiology

## 2014-12-21 VITALS — BP 86/40 | HR 77 | Wt 134.8 lb

## 2014-12-21 DIAGNOSIS — I5022 Chronic systolic (congestive) heart failure: Secondary | ICD-10-CM | POA: Insufficient documentation

## 2014-12-21 DIAGNOSIS — Z79899 Other long term (current) drug therapy: Secondary | ICD-10-CM | POA: Insufficient documentation

## 2014-12-21 DIAGNOSIS — E119 Type 2 diabetes mellitus without complications: Secondary | ICD-10-CM | POA: Diagnosis not present

## 2014-12-21 DIAGNOSIS — I739 Peripheral vascular disease, unspecified: Secondary | ICD-10-CM | POA: Diagnosis not present

## 2014-12-21 DIAGNOSIS — I959 Hypotension, unspecified: Secondary | ICD-10-CM | POA: Diagnosis not present

## 2014-12-21 DIAGNOSIS — E785 Hyperlipidemia, unspecified: Secondary | ICD-10-CM | POA: Diagnosis not present

## 2014-12-21 DIAGNOSIS — I6523 Occlusion and stenosis of bilateral carotid arteries: Secondary | ICD-10-CM | POA: Diagnosis not present

## 2014-12-21 DIAGNOSIS — I5023 Acute on chronic systolic (congestive) heart failure: Secondary | ICD-10-CM

## 2014-12-21 DIAGNOSIS — N189 Chronic kidney disease, unspecified: Secondary | ICD-10-CM | POA: Diagnosis not present

## 2014-12-21 DIAGNOSIS — Z7982 Long term (current) use of aspirin: Secondary | ICD-10-CM | POA: Diagnosis not present

## 2014-12-21 DIAGNOSIS — I129 Hypertensive chronic kidney disease with stage 1 through stage 4 chronic kidney disease, or unspecified chronic kidney disease: Secondary | ICD-10-CM | POA: Diagnosis not present

## 2014-12-21 DIAGNOSIS — N183 Chronic kidney disease, stage 3 unspecified: Secondary | ICD-10-CM

## 2014-12-21 DIAGNOSIS — I10 Essential (primary) hypertension: Secondary | ICD-10-CM

## 2014-12-21 DIAGNOSIS — Z953 Presence of xenogenic heart valve: Secondary | ICD-10-CM | POA: Diagnosis not present

## 2014-12-21 MED ORDER — POTASSIUM CHLORIDE CRYS ER 20 MEQ PO TBCR
20.0000 meq | EXTENDED_RELEASE_TABLET | Freq: Every day | ORAL | Status: DC
Start: 2014-12-21 — End: 2015-02-02

## 2014-12-21 MED ORDER — LOSARTAN POTASSIUM 50 MG PO TABS: 50.0000 mg | ORAL_TABLET | Freq: Every day | ORAL | Status: DC

## 2014-12-21 NOTE — Progress Notes (Signed)
Patient ID: Joanna [name], female   DOB: [birth year], 81 y.o.   MRN: [id number]  PCP: Dr Yetta Barre ([facility] on [facility]) CVTS Surgeon: Dr. Cornelius Moras Primary Cardiologist: Dr. Gala Romney  HPI: Joanna [name] is a 81 year old woman with aortic stenosis s/p AVR/mitral valve replacement (06/15/2013), systolic HF due to NICM, hypertension, diabetes mellitus type II, hyperlipidemia, and peripheral artery disease. Underwent AVR/MVR with placement of epicardial lead on 06/15/13 with PAF post-op terminated with amiodarone.  She is off amiodarone now due to nausea..   Admitted initially in 09/2011 with acute systolic HF, EF 30%. Cath with normal coronaries and normal cardiac output (see below). AS was mild to moderate. In hospital also found to be iron-deficiency anemia. Previous colonoscopies normal. Seen by GI who suggested trial of iron and see if it improved.  05/19/2013 ECHO EF 35% severe AS (low gradient severe AS confirmed by DSE), gradient: 32mm Hg (S). Peak gradient: 57mm Hg  08/18/13 ECHO EF 45-50% AV working well  Had 30 day event monitor in 1/15 without recurrent AF.   Admitted to [facility] 12/15/14 with volume overload. Diuresed with IV lasix and transitioned to lasix 40 mg twice a day. Discharge weight 128 pounds. She went home 2/28 and fell at home. She returned to ED with negative CT scan.   Follow up:  She returns for follow up. Denies SOB/PND/Orhtopnea. Weight at home 124-127 pounds. Has leg fatigue. Has life alert now. Taking all medications. Wearing compression stockings. Increased leg edema.   Labs (9/14): K 5.4, creatinine 1.69    (09/2013): K+ 4.6, creatinine 1.08, pro-BNP 11643   (10/17/13): K+ 4.4, creatinine 1.6       (11/28/13): K+ 4.1, Creatinine 1.6       (12/15/2014) : K 3.5 Creatinine 1.72    ROS:  12-point review of systems went over in clinic and all negative except as described in HPI and problem list.  Past Medical History  Diagnosis Date  . Peripheral artery disease   . Hyperlipidemia     takes  Simvastatin daily  . PONV (postoperative nausea and vomiting)   . Shortness of breath   . Arthritis   . Aortic stenosis 09/21/2011  . AORTIC VALVE SCLEROSIS 02/21/2010  . 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 BUNDLE BRANCH BLOCK, LEFT 07/30/2007  . CAROTID ARTERY STENOSIS 01/05/2009  . CVA 03/14/2010  . 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 PERIPHERAL VASCULAR DISEASE 07/30/2007  . POLYPECTOMY, HX OF 07/30/2007  . Constipation   . Chronic combined systolic and diastolic CHF (congestive heart failure)     a. RHC 09/2011 RA 8, RV 58/2/11; PA 54/22 (37) PCWP 20; F CO/CI 4.57/2.67 PVR 3.7; b. L main no sig dz, LAD luminal irreg; LCx lg Ramus with luminal irreg, small AV Lcx witout sig dz, RCA luminal irreg; severe mitral annular calcifications; AV calcified c. EF 45-50% (07/2013);  d. 11/2014 Echo: EF 20-25%, diff HK, nl AV/MV, sev dil LA, mod TR/PR, PASP .  . Diabetes mellitus   . DM 07/30/2007  . HTN (hypertension)   . HYPERTENSION 07/30/2007  . Weakness   . Dizziness   . Joint pain   . Itching   . Anemia   . Iron deficiency anemia 09/21/2011  . Nocturia   . History of blood transfusion   . History of shingles   . Mitral regurgitation   . S/P aortic valve replacement with bioprosthetic valve 06/15/2013    21 mm [facility] Ease bovine pericardial tissue valve  . S/P mitral valve replacement  with bioprosthetic valve 06/15/2013    25 mm [facility] Mitral bovine pericardial tissue valve     Current Outpatient Prescriptions  Medication Sig Dispense Refill  . aspirin EC 81 MG tablet Take 1 tablet (81 mg total) by mouth daily. 90 tablet 3  . calcium citrate-vitamin D (CITRACAL+D) 315-200 MG-UNIT per tablet Take 1 tablet by mouth daily.    . carvedilol (COREG) 6.25 MG tablet TAKE 1 TABLET BY MOUTH TWICE A DAY WITH MEALS (Patient taking differently: Take 6.25 mg by mouth 2 (two) times daily with a meal. TAKE 1 TABLET BY MOUTH TWICE A DAY WITH MEALS) 180 tablet 3  . docusate sodium (COLACE)  100 MG capsule Take 100 mg by mouth 2 (two) times daily.      . ferrous sulfate 325 (65 FE) MG tablet Take 1 tablet (325 mg total) by mouth 2 (two) times daily with a meal. 180 tablet 1  . furosemide (LASIX) 40 MG tablet Take 1 tablet (40 mg total) by mouth 2 (two) times daily. 60 tablet 6  . losartan (COZAAR) 100 MG tablet Take 1 tablet by mouth  daily (Patient taking differently: Take 100 mg by mouth daily. Take 1 tablet by mouth  daily) 90 tablet 3  . metolazone (ZAROXOLYN) 2.5 MG tablet Take 1 tablet (2.5 mg total) by mouth daily as needed (For more than 3 pound weight gain in 1 day or 5 pounds in a week). 15 tablet 1  . simvastatin (ZOCOR) 20 MG tablet Take 1 tablet (20 mg total) by mouth every evening. 90 tablet 3  . sitaGLIPtin (JANUVIA) 50 MG tablet Take 1 tablet (50 mg total) by mouth daily. 90 tablet 3   No current facility-administered medications for this encounter.   History   Social History  . Marital Status: Divorced    Spouse Name: N/A  . Number of Children: N/A  . Years of Education: N/A   Social History Main Topics  . Smoking status: Never Smoker   . Smokeless tobacco: Never Used  . Alcohol Use: No  . Drug Use: No  . Sexual Activity: No   Other Topics Concern  . Not on file   Social History Narrative   Patient lives alone here in town   She continues to work, helping to bind and oversew books   She is divorced after 23 years of marriage   1 son- '61, minister; 1 dtr '55; 4 grandchildren   End of life care-provided packet on living well   Family History  Problem Relation Age of Onset  . Colon cancer Mother   . Diabetes Neg Hx   . Coronary artery disease Neg Hx       Filed Vitals:   12/21/14 1427  BP: 86/40  Pulse: 77  Weight: 134 lb 12.8 oz (61.145 kg)  SpO2: 99%    PHYSICAL EXAM: General:  Elderly. NAD; daughter and grandson present HEENT: normal Neck: supple. JVP jaw  with prominent CV waves. Carotids 2+ bilaterally; no bruits. No  lymphadenopathy or thryomegaly appreciated. Cor: PMI normal. Regular rate & rhythm. 2/6 SEM. + S3   Lungs: CTA  Abdomen: soft, nontender, ++distended. No hepatosplenomegaly. No bruits or masses. Good bowel sounds. Extremities: no cyanosis, clubbing, rash, R and LLE 2+ edema into thighs; TED hose intact  Neuro: alert & orientedx3, cranial nerves grossly intact. Moves all 4 extremities w/o difficulty. Affect pleasant  ASSESSMENT & PLAN:  1. Chronic systolic CHF: EF back down to 16% (11/2014)  Now  with S3.  -Volume overload noted today but hypotensive. Stop carvedilol. Cut back losartan to 50 mg daily.  Continue lasix 40 mg twice a day and she will take metolazone 2.5 mg daily for the next 2 days.  Add 20 meq potassium daily.  2. Carotid stenosis: Reviewed carotid dopplers; 1-39% LICA, 40-59% RICA, patent vertebral arteries with antegrade flow (11/2013). Due for repeat 3. Hyperlipidemia: Managed by PCP. Continue statin. 4. Aortic stenosis/mitral regurg: s/p bioprosthetic AVR/MVR.  Well-seated valves on recent echo 5. CKD: Follow closely  6. HTN: now hypotensive as above. .   7. Post-op AF - no evidence of recurrence.   Follow up next week. May require admit.   CLEGG,AMY, NP-C  2:51 PM 12/21/2014  Patient seen and examined with Tonye Becket, NP. We discussed all aspects of the encounter. I agree with the assessment and plan as stated above.   She is very tenuous. Her EF is back down. Now with volume overload, hypotension and prominent s3. Concern for low output. Will stop carvedilol and cut losartan in half. Add metolazone. See back early next week. If no improvement will need admission and consideration of inotropic support.   Truman Hayward 5:19 PM

## 2014-12-21 NOTE — Patient Instructions (Signed)
Stop Carvedilol   Decrease Losartan to 50 mg daily, you can cut your 100 mg tabs in half and when you run out we have sent you in a new prescription for 50 mg tablets  Take Metolazone 2.5 mg for 3 days  Start Potassium 20 meq daily  Call tomorrow with update on how you are doing  Your physician recommends that you schedule a follow-up appointment in: Mon or Tue

## 2014-12-22 ENCOUNTER — Telehealth (HOSPITAL_COMMUNITY): Payer: Self-pay | Admitting: Vascular Surgery

## 2014-12-22 NOTE — Telephone Encounter (Signed)
Pt daughter she was told to call back today for an update.. Pt is feeling well, swelling in ankles and legs went down a little bit, she has been out today running errands she seems stronger... FYI

## 2014-12-25 ENCOUNTER — Encounter (HOSPITAL_COMMUNITY): Payer: Self-pay

## 2014-12-25 ENCOUNTER — Encounter: Payer: Self-pay | Admitting: Internal Medicine

## 2014-12-25 ENCOUNTER — Other Ambulatory Visit (INDEPENDENT_AMBULATORY_CARE_PROVIDER_SITE_OTHER): Payer: Medicare Other

## 2014-12-25 ENCOUNTER — Ambulatory Visit (HOSPITAL_COMMUNITY)
Admission: RE | Admit: 2014-12-25 | Discharge: 2014-12-25 | Disposition: A | Discharge status: Home / Self Care | Payer: Medicare Other | Source: Ambulatory Visit | Attending: Cardiology | Admitting: Cardiology

## 2014-12-25 ENCOUNTER — Ambulatory Visit (INDEPENDENT_AMBULATORY_CARE_PROVIDER_SITE_OTHER): Payer: Medicare Other | Admitting: Internal Medicine

## 2014-12-25 VITALS — BP 118/62 | HR 91 | Wt 129.2 lb

## 2014-12-25 VITALS — BP 120/64 | HR 92 | Temp 98.6°F | Resp 16 | Ht 64.0 in | Wt 129.0 lb

## 2014-12-25 DIAGNOSIS — I350 Nonrheumatic aortic (valve) stenosis: Secondary | ICD-10-CM | POA: Diagnosis not present

## 2014-12-25 DIAGNOSIS — D509 Iron deficiency anemia, unspecified: Secondary | ICD-10-CM

## 2014-12-25 DIAGNOSIS — I5022 Chronic systolic (congestive) heart failure: Secondary | ICD-10-CM | POA: Diagnosis present

## 2014-12-25 DIAGNOSIS — Z23 Encounter for immunization: Secondary | ICD-10-CM

## 2014-12-25 DIAGNOSIS — N189 Chronic kidney disease, unspecified: Secondary | ICD-10-CM | POA: Insufficient documentation

## 2014-12-25 DIAGNOSIS — E785 Hyperlipidemia, unspecified: Secondary | ICD-10-CM | POA: Insufficient documentation

## 2014-12-25 DIAGNOSIS — E1121 Type 2 diabetes mellitus with diabetic nephropathy: Secondary | ICD-10-CM

## 2014-12-25 DIAGNOSIS — I6523 Occlusion and stenosis of bilateral carotid arteries: Secondary | ICD-10-CM

## 2014-12-25 DIAGNOSIS — I6529 Occlusion and stenosis of unspecified carotid artery: Secondary | ICD-10-CM | POA: Diagnosis not present

## 2014-12-25 DIAGNOSIS — N183 Chronic kidney disease, stage 3 unspecified: Secondary | ICD-10-CM

## 2014-12-25 DIAGNOSIS — I5023 Acute on chronic systolic (congestive) heart failure: Secondary | ICD-10-CM | POA: Diagnosis not present

## 2014-12-25 DIAGNOSIS — I10 Essential (primary) hypertension: Secondary | ICD-10-CM

## 2014-12-25 DIAGNOSIS — Z953 Presence of xenogenic heart valve: Secondary | ICD-10-CM | POA: Diagnosis not present

## 2014-12-25 DIAGNOSIS — I5032 Chronic diastolic (congestive) heart failure: Secondary | ICD-10-CM

## 2014-12-25 DIAGNOSIS — I779 Disorder of arteries and arterioles, unspecified: Secondary | ICD-10-CM

## 2014-12-25 DIAGNOSIS — I480 Paroxysmal atrial fibrillation: Secondary | ICD-10-CM

## 2014-12-25 DIAGNOSIS — I739 Peripheral vascular disease, unspecified: Secondary | ICD-10-CM

## 2014-12-25 LAB — LIPID PANEL
CHOL/HDL RATIO: 3
Cholesterol: 169 mg/dL (ref 0–200)
HDL: 54.7 mg/dL (ref >39.00)
LDL Cholesterol: 93 mg/dL (ref 0–99)
NONHDL: 114.3
TRIGLYCERIDES: 108 mg/dL (ref 0.0–149.0)
VLDL: 21.6 mg/dL (ref 0.0–40.0)

## 2014-12-25 LAB — CBC WITH DIFFERENTIAL/PLATELET
Basophils Absolute: 0 10*3/uL (ref 0.0–0.1)
Basophils Relative: 0.3 % (ref 0.0–3.0)
EOS PCT: 6.1 % — AB (ref 0.0–5.0)
Eosinophils Absolute: 0.4 10*3/uL (ref 0.0–0.7)
HEMATOCRIT: 30.6 % — AB (ref 36.0–46.0)
HEMOGLOBIN: 10.4 g/dL — AB (ref 12.0–15.0)
LYMPHS ABS: 1 10*3/uL (ref 0.7–4.0)
Lymphocytes Relative: 15 % (ref 12.0–46.0)
MCHC: 34.1 g/dL (ref 30.0–36.0)
MCV: 89.1 fl (ref 78.0–100.0)
MONOS PCT: 4.1 % (ref 3.0–12.0)
Monocytes Absolute: 0.3 10*3/uL (ref 0.1–1.0)
Neutro Abs: 5.2 10*3/uL (ref 1.4–7.7)
Neutrophils Relative %: 74.5 % (ref 43.0–77.0)
Platelets: 246 10*3/uL (ref 150.0–400.0)
RBC: 3.43 Mil/uL — ABNORMAL LOW (ref 3.87–5.11)
RDW: 14.4 % (ref 11.5–15.5)
WBC: 7 10*3/uL (ref 4.0–10.5)

## 2014-12-25 LAB — IBC PANEL
Iron: 77 ug/dL (ref 42–145)
SATURATION RATIOS: 18.2 % — AB (ref 20.0–50.0)
Transferrin: 303 mg/dL (ref 212.0–360.0)

## 2014-12-25 LAB — BASIC METABOLIC PANEL
BUN: 50 mg/dL — AB (ref 6–23)
CO2: 35 mEq/L — ABNORMAL HIGH (ref 19–32)
CREATININE: 1.63 mg/dL — AB (ref 0.40–1.20)
Calcium: 9.8 mg/dL (ref 8.4–10.5)
Chloride: 94 mEq/L — ABNORMAL LOW (ref 96–112)
GFR: 32.07 mL/min — ABNORMAL LOW (ref >60.00)
GLUCOSE: 396 mg/dL — AB (ref 70–99)
POTASSIUM: 4.1 meq/L (ref 3.5–5.1)
Sodium: 135 mEq/L (ref 135–145)

## 2014-12-25 LAB — URINALYSIS, ROUTINE W REFLEX MICROSCOPIC
Bacteria, UA: NONE SEEN
Bilirubin Urine: NEGATIVE
Hgb urine dipstick: NEGATIVE
Ketones, ur: NEGATIVE
Nitrite: NEGATIVE
RBC / HPF: NONE SEEN (ref 0–?)
SPECIFIC GRAVITY, URINE: 1.01 (ref 1.000–1.030)
Total Protein, Urine: NEGATIVE
UROBILINOGEN UA: 0.2 (ref 0.0–1.0)
Urine Glucose: >=1000 — AB
pH: 7 (ref 5.0–8.0)

## 2014-12-25 LAB — MICROALBUMIN / CREATININE URINE RATIO
Creatinine,U: 52.3 mg/dL
MICROALB UR: 3.8 mg/dL — AB (ref 0.0–1.9)
Microalb Creat Ratio: 7.3 mg/g (ref 0.0–30.0)

## 2014-12-25 LAB — HEMOGLOBIN A1C: HEMOGLOBIN A1C: 7.8 % — AB (ref 4.6–6.5)

## 2014-12-25 LAB — FERRITIN: FERRITIN: 78.9 ng/mL (ref 10.0–291.0)

## 2014-12-25 MED ORDER — INSULIN PEN NEEDLE 32G X 6 MM MISC: 1.0000 | Freq: Every day | Status: DC

## 2014-12-25 MED ORDER — METOLAZONE 2.5 MG PO TABS: 2.5000 mg | ORAL_TABLET | ORAL | Status: DC

## 2014-12-25 MED ORDER — INSULIN GLARGINE 300 UNIT/ML ~~LOC~~ SOPN: 30.0000 [IU] | PEN_INJECTOR | Freq: Every day | SUBCUTANEOUS | Status: DC

## 2014-12-25 NOTE — Patient Instructions (Signed)

## 2014-12-25 NOTE — Assessment & Plan Note (Signed)
Her iron level is normal now but her H and H are still slightly low, she may have developed anemia of chronic disease or anemia of renal disease, will follow for now

## 2014-12-25 NOTE — Progress Notes (Signed)
Patient ID: Joanna [name], female   DOB: 1930-01-20, 81 y.o.   MRN: [id number]  PCP: Dr Yetta Barre ([facility] on [place]) CVTS Surgeon: Dr. Cornelius Moras Primary Cardiologist: Dr. Gala Romney  HPI: Joanna [name] is a 81 year old woman with aortic stenosis s/p AVR/mitral valve replacement (06/15/2013), systolic HF due to NICM, hypertension, diabetes mellitus type II, hyperlipidemia, and peripheral artery disease. Underwent AVR/MVR with placement of epicardial lead on 06/15/13 with PAF post-op terminated with amiodarone.  She is off amiodarone now due to nausea..   Admitted initially in 09/2011 with acute systolic HF, EF 30%. Cath with normal coronaries and normal cardiac output (see below). AS was mild to moderate. In hospital also found to be iron-deficiency anemia. Previous colonoscopies normal. Seen by GI who suggested trial of iron and see if it improved.  05/19/2013 ECHO EF 35% severe AS (low gradient severe AS confirmed by DSE), gradient: 32 mm Hg (S). Peak gradient: 57mm Hg  08/18/13 ECHO EF 45-50% AV working well  Had 30 day event monitor in 1/15 without recurrent AF.   Admitted to [facility] 12/15/14 with volume overload. Diuresed with IV lasix and transitioned to lasix 40 mg twice a day. Discharge weight 128 pounds. She went home 2/28 and fell at home. She returned to ED with negative CT scan.   Echo (2/16) with EF 20-25%, moderately dilated LV with mild LVH, normal bioprosthetic mitral and aortic valves, moderate TR, PA systolic pressure 54 mmHg.   At last appointment, she was hypotensive and dyspneic.  She was volume overloaded on exam.  She took metolazone Thursday/Friday/Saturday of last week and is now breathing much better.  Weight is down 5 lbs.  Decreased lower extremity edema.  Now, no dyspnea walking on flat ground.  No lightheadedness or syncope.  Coreg was stopped and losartan was decreased.  BP 118/62 today.    Labs 9/14: K 5.4, creatinine 1.69 09/2013: K+ 4.6, creatinine 1.08, pro-BNP 11643 10/17/13: K+ 4.4,  creatinine 1.6 11/28/14: K+ 4.1, Creatinine 1.6 12/15/14: K 3.5, creatinine 1.72  ECG: NSR, LBBB (152 msec)  ROS:  12-point review of systems went over in clinic and all negative except as described in HPI and problem list.  Past Medical History  Diagnosis Date  . Peripheral artery disease   . Hyperlipidemia     takes Simvastatin daily  . PONV (postoperative nausea and vomiting)   . Shortness of breath   . Arthritis   . Aortic stenosis 09/21/2011  . AORTIC VALVE SCLEROSIS 02/21/2010  . 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 BUNDLE BRANCH BLOCK, LEFT 07/30/2007  . CAROTID ARTERY STENOSIS 01/05/2009  . CVA 03/14/2010  . 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 PERIPHERAL VASCULAR DISEASE 07/30/2007  . POLYPECTOMY, HX OF 07/30/2007  . Constipation   . Chronic combined systolic and diastolic CHF (congestive heart failure)     a. RHC 09/2011 RA 8, RV 58/2/11; PA 54/22 (37) PCWP 20; F CO/CI 4.57/2.67 PVR 3.7; b. L main no sig dz, LAD luminal irreg; LCx lg Ramus with luminal irreg, small AV Lcx witout sig dz, RCA luminal irreg; severe mitral annular calcifications; AV calcified c. EF 45-50% (07/2013);  d. 11/2014 Echo: EF 20-25%, diff HK, nl AV/MV, sev dil LA, mod TR/PR, PASP .  . Diabetes mellitus   . DM 07/30/2007  . HTN (hypertension)   . HYPERTENSION 07/30/2007  . Weakness   . Dizziness   . Joint pain   . Itching   . Anemia   . Iron deficiency anemia 09/21/2011  .  Nocturia   . History of blood transfusion   . History of shingles   . Mitral regurgitation   . S/P aortic valve replacement with bioprosthetic valve 06/15/2013    21 mm [facility] Ease bovine pericardial tissue valve  . S/P mitral valve replacement with bioprosthetic valve 06/15/2013    25 mm [facility] Mitral bovine pericardial tissue valve     Current Outpatient Prescriptions  Medication Sig Dispense Refill  . aspirin EC 81 MG tablet Take 1 tablet (81 mg total) by mouth daily. 90 tablet 3  . calcium citrate-vitamin D  (CITRACAL+D) 315-200 MG-UNIT per tablet Take 1 tablet by mouth daily.    Marland Kitchen docusate sodium (COLACE) 100 MG capsule Take 100 mg by mouth 2 (two) times daily.      . ferrous sulfate 325 (65 FE) MG tablet Take 1 tablet (325 mg total) by mouth 2 (two) times daily with a meal. 180 tablet 1  . furosemide (LASIX) 40 MG tablet Take 1 tablet (40 mg total) by mouth 2 (two) times daily. 60 tablet 6  . losartan (COZAAR) 50 MG tablet Take 1 tablet (50 mg total) by mouth daily. Please cancel all previous orders for current medication. Change in dosage or pill size. 30 tablet 3  . metolazone (ZAROXOLYN) 2.5 MG tablet Take 1 tablet (2.5 mg total) by mouth once a week. On Tuesday 30 min prior to morning Lasix, 15 tablet 1  . potassium chloride SA (K-DUR,KLOR-CON) 20 MEQ tablet Take 1 tablet (20 mEq total) by mouth daily. 30 tablet 3  . simvastatin (ZOCOR) 20 MG tablet Take 1 tablet (20 mg total) by mouth every evening. 90 tablet 3  . Insulin Glargine (TOUJEO SOLOSTAR) 300 UNIT/ML SOPN Inject 30 Units into the skin daily. 1.5 mL 11  . Insulin Pen Needle (NOVOFINE) 32G X 6 MM MISC 1 Act by Does not apply route daily. 1 ACT sq qd 100 each 3   No current facility-administered medications for this encounter.   History   Social History  . Marital Status: Divorced    Spouse Name: N/A  . Number of Children: N/A  . Years of Education: N/A   Social History Main Topics  . Smoking status: Never Smoker   . Smokeless tobacco: Never Used  . Alcohol Use: No  . Drug Use: No  . Sexual Activity: No   Other Topics Concern  . None   Social History Narrative   Patient lives alone here in town   She continues to work, helping to bind and oversew books   She is divorced after 23 years of marriage   1 son- '61, minister; 1 dtr '55; 4 grandchildren   End of life care-provided packet on living well   Family History  Problem Relation Age of Onset  . Colon cancer Mother   . Diabetes Neg Hx   . Coronary artery disease  Neg Hx       Filed Vitals:   12/25/14 1102  BP: 118/62  Pulse: 91  Weight: 129 lb 4 oz (58.627 kg)  SpO2: 96%    PHYSICAL EXAM: General:  Elderly. NAD; daughter and grandson present HEENT: normal Neck: supple. JVP 7-8 cm. Carotids 2+ bilaterally; no bruits. No lymphadenopathy or thryomegaly appreciated. Cor: PMI normal. Regular rate & rhythm. 1/6 SEM. + S3   Lungs: CTA  Abdomen: soft, nontender, nondistended. No hepatosplenomegaly. No bruits or masses. Good bowel sounds. Extremities: no cyanosis, clubbing, rash, 1+ ankle edema; TED hose Neuro: alert &  orientedx3, cranial nerves grossly intact. Moves all 4 extremities w/o difficulty. Affect pleasant  ASSESSMENT & PLAN:  1. Chronic systolic CHF: EF back down to 16-10% (11/2014).  Volume overloaded at last visit, diuresis increased.  She is doing better now.  Weight is down.  NYHA class II-III.  BP better off Coreg and on lower losartan.  Mildly volume overloaded on exam now.   - Continue current Lasix 40 mg bid.  She will take metolazone once a week on Tuesdays starting tomorrow.  - Continue current KCl. - BMET today.  - Continue losartan 50 mg daily.  She will stay off Coreg for the time-being.  - ECG shows wide LBBB (152 msec).  If EF does not recovery, CRT could be considered.  2. Carotid stenosis: Due for repeat carotid dopplers, will arrange.  3. Hyperlipidemia: On statin, check lipids today.  4. Aortic stenosis/mitral regurgitation: s/p bioprosthetic AVR/MVR.  Well-seated valves on recent echo 5. CKD: BMET today.  6. Post-op AF:  No evidence of recurrence.   Followup in 1 month.   Airica Schwartzkopf,MD 12/25/2014

## 2014-12-25 NOTE — Progress Notes (Signed)
Pre visit review using our clinic review tool, if applicable. No additional management support is needed unless otherwise documented below in the visit note. 

## 2014-12-25 NOTE — Assessment & Plan Note (Signed)
Will control her BP and BS She will avoid nephrotoxic agents Will cont to monitor

## 2014-12-25 NOTE — Assessment & Plan Note (Signed)
Her BP is well controlled Lytes are stable

## 2014-12-25 NOTE — Assessment & Plan Note (Signed)
She has had to stop [place] because it was too expensive Metformin was stopped due to low GFR Her A1C is up quite a bit and there is glucosuria, will start a basal insulin

## 2014-12-25 NOTE — Patient Instructions (Addendum)
CHANGE Metolazone to 2.5 mg every Tuesday, to take 30 mins prior to morning Lasix dose  Your physician has requested that you have a carotid duplex. This test is an ultrasound of the carotid arteries in your neck. It looks at blood flow through these arteries that supply the brain with blood. Allow one hour for this exam. There are no restrictions or special instructions. [facility]  Your physician recommends that you schedule a follow-up appointment in: 1 month  Do the following things EVERYDAY: 1) Weigh yourself in the morning before breakfast. Write it down and keep it in a log. 2) Take your medicines as prescribed 3) Eat low salt foods-Limit salt (sodium) to 2000 mg per day.  4) Stay as active as you can everyday 5) Limit all fluids for the day to less than 2 liters 6)

## 2014-12-25 NOTE — Assessment & Plan Note (Signed)
Her volume status is normal today Her BP is well controlled She feels well Will cont the current meds with no changes

## 2014-12-25 NOTE — Progress Notes (Signed)
Subjective:    Patient ID: Joanna [name], female    DOB: 1930/08/30, 81 y.o.   MRN: [id number]  Diabetes She presents for her follow-up diabetic visit. She has type 2 diabetes mellitus. Her disease course has been worsening. There are no hypoglycemic associated symptoms. Pertinent negatives for hypoglycemia include no dizziness or tremors. Associated symptoms include polyuria. Pertinent negatives for diabetes include no blurred vision, no chest pain, no fatigue, no foot paresthesias, no foot ulcerations, no polydipsia, no polyphagia, no visual change, no weakness and no weight loss. There are no hypoglycemic complications. Symptoms are stable. Diabetic complications include heart disease and nephropathy. Current diabetic treatment includes oral agent (monotherapy). She is compliant with treatment some of the time. Her weight is stable. She is following a generally healthy diet. Meal planning includes avoidance of concentrated sweets. She participates in exercise intermittently. There is no change in her home blood glucose trend. An ACE inhibitor/angiotensin II receptor blocker is being taken. She does not see a podiatrist.Eye exam is current.      Review of Systems  Constitutional: Negative.  Negative for fever, chills, weight loss, diaphoresis, appetite change and fatigue.  HENT: Negative.   Eyes: Negative.  Negative for blurred vision.  Respiratory: Negative.  Negative for apnea, cough, choking, chest tightness, shortness of breath and stridor.   Cardiovascular: Negative.  Negative for chest pain, palpitations and leg swelling.  Gastrointestinal: Negative.  Negative for nausea, vomiting, abdominal pain, diarrhea, constipation and blood in stool.  Endocrine: Positive for polyuria. Negative for polydipsia and polyphagia.  Genitourinary: Negative.   Musculoskeletal: Negative.   Skin: Negative.  Negative for rash.  Allergic/Immunologic: Negative.   Neurological: Negative.  Negative for  dizziness, tremors, weakness, light-headedness and numbness.  Hematological: Negative.  Negative for adenopathy. Does not bruise/bleed easily.  Psychiatric/Behavioral: Negative.        Objective:   Physical Exam  Constitutional: She is oriented to person, place, and time. She appears well-developed and well-nourished. No distress.  HENT:  Head: Normocephalic and atraumatic.  Mouth/Throat: Oropharynx is clear and moist. No oropharyngeal exudate.  Eyes: Conjunctivae are normal. Right eye exhibits no discharge. Left eye exhibits no discharge. No scleral icterus.  Neck: Normal range of motion. Neck supple. No JVD present. No tracheal deviation present. No thyromegaly present.  Cardiovascular: Normal rate, regular rhythm, normal heart sounds and intact distal pulses.  Exam reveals no gallop and no friction rub.   No murmur heard. Pulmonary/Chest: Effort normal and breath sounds normal. No stridor. No respiratory distress. She has no wheezes. She has no rales. She exhibits no tenderness.  Abdominal: Soft. Bowel sounds are normal. She exhibits no distension and no mass. There is no tenderness. There is no rebound and no guarding.  Musculoskeletal: Normal range of motion. She exhibits no edema or tenderness.  Lymphadenopathy:    She has no cervical adenopathy.  Neurological: She is oriented to person, place, and time.  Skin: Skin is warm and dry. No rash noted. She is not diaphoretic. No erythema. No pallor.  Psychiatric: She has a normal mood and affect. Her behavior is normal. Judgment and thought content normal.  Vitals reviewed.    Lab Results  Component Value Date   WBC 5.8 12/13/2014   HGB 9.9* 12/13/2014   HCT 31.3* 12/13/2014   PLT 214 12/13/2014   GLUCOSE 177* 12/15/2014   CHOL 212* 11/28/2013   TRIG 135.0 11/28/2013   HDL 69.30 11/28/2013   LDLDIRECT 120.6 11/28/2013   LDLCALC 75 11/15/2010  ALT 26 12/13/2014   AST 28 12/13/2014   NA 136 12/15/2014   K 3.5 12/15/2014    CL 92* 12/15/2014   CREATININE 1.72* 12/15/2014   BUN 37* 12/15/2014   CO2 35* 12/15/2014   TSH 2.010 12/13/2014   INR 3.3 07/07/2014   HGBA1C 6.3 02/28/2014       Assessment & Plan:

## 2014-12-25 NOTE — Addendum Note (Signed)
Addended by: Rock Nephew T on: 12/25/2014 04:59 PM   Modules accepted: Orders, SmartSet

## 2014-12-26 NOTE — Addendum Note (Signed)
Encounter addended by: Wandalee Ferdinand on: 12/26/2014  9:26 AM<BR>     Documentation filed: Charges VN

## 2014-12-27 ENCOUNTER — Encounter (HOSPITAL_COMMUNITY): Payer: PRIVATE HEALTH INSURANCE

## 2014-12-27 ENCOUNTER — Telehealth: Payer: Self-pay | Admitting: Internal Medicine

## 2014-12-27 NOTE — Telephone Encounter (Signed)
emmi mailed  °

## 2014-12-29 ENCOUNTER — Other Ambulatory Visit: Payer: Self-pay

## 2014-12-29 DIAGNOSIS — E1121 Type 2 diabetes mellitus with diabetic nephropathy: Secondary | ICD-10-CM

## 2014-12-29 MED ORDER — INSULIN PEN NEEDLE 32G X 6 MM MISC: Status: DC

## 2014-12-29 MED ORDER — INSULIN GLARGINE 300 UNIT/ML ~~LOC~~ SOPN: 30.0000 [IU] | PEN_INJECTOR | Freq: Every day | SUBCUTANEOUS | Status: DC

## 2015-01-10 ENCOUNTER — Telehealth (HOSPITAL_COMMUNITY): Payer: Self-pay

## 2015-01-10 ENCOUNTER — Ambulatory Visit (HOSPITAL_COMMUNITY): Payer: Medicare Other | Attending: Cardiology | Admitting: Cardiology

## 2015-01-10 DIAGNOSIS — I6523 Occlusion and stenosis of bilateral carotid arteries: Secondary | ICD-10-CM | POA: Diagnosis present

## 2015-01-10 DIAGNOSIS — I779 Disorder of arteries and arterioles, unspecified: Secondary | ICD-10-CM | POA: Insufficient documentation

## 2015-01-10 DIAGNOSIS — I739 Peripheral vascular disease, unspecified: Secondary | ICD-10-CM

## 2015-01-10 NOTE — Progress Notes (Signed)
Carotid duplex performed 

## 2015-01-10 NOTE — Telephone Encounter (Signed)
Carotid doppler results reported to patient's daughter.  Aware and appreciative.  Ave Filter

## 2015-01-29 ENCOUNTER — Telehealth (HOSPITAL_COMMUNITY): Payer: Self-pay | Admitting: *Deleted

## 2015-01-29 NOTE — Telephone Encounter (Signed)
pts daughter stated pt is losing her balance legs are weak and wanted to know if she should call us or her primary.  I advised her to call her primary (per heather and Dr. Shirlee Latch)

## 2015-01-30 ENCOUNTER — Encounter (HOSPITAL_COMMUNITY): Payer: Self-pay | Admitting: Emergency Medicine

## 2015-01-30 ENCOUNTER — Emergency Department (HOSPITAL_COMMUNITY): Payer: Medicare Other

## 2015-01-30 ENCOUNTER — Inpatient Hospital Stay (HOSPITAL_COMMUNITY)
Admission: EM | Admit: 2015-01-30 | Discharge: 2015-02-02 | DRG: 493 | Disposition: A | Discharge status: Skilled Nursing Facility | Payer: Medicare Other | Attending: Internal Medicine | Admitting: Internal Medicine

## 2015-01-30 DIAGNOSIS — R739 Hyperglycemia, unspecified: Secondary | ICD-10-CM | POA: Diagnosis present

## 2015-01-30 DIAGNOSIS — E1122 Type 2 diabetes mellitus with diabetic chronic kidney disease: Secondary | ICD-10-CM | POA: Diagnosis present

## 2015-01-30 DIAGNOSIS — I10 Essential (primary) hypertension: Secondary | ICD-10-CM | POA: Diagnosis present

## 2015-01-30 DIAGNOSIS — S82841A Displaced bimalleolar fracture of right lower leg, initial encounter for closed fracture: Principal | ICD-10-CM

## 2015-01-30 DIAGNOSIS — Z9841 Cataract extraction status, right eye: Secondary | ICD-10-CM

## 2015-01-30 DIAGNOSIS — I480 Paroxysmal atrial fibrillation: Secondary | ICD-10-CM | POA: Diagnosis present

## 2015-01-30 DIAGNOSIS — E783 Hyperchylomicronemia: Secondary | ICD-10-CM | POA: Diagnosis present

## 2015-01-30 DIAGNOSIS — E440 Moderate protein-calorie malnutrition: Secondary | ICD-10-CM | POA: Insufficient documentation

## 2015-01-30 DIAGNOSIS — N183 Chronic kidney disease, stage 3 unspecified: Secondary | ICD-10-CM | POA: Diagnosis present

## 2015-01-30 DIAGNOSIS — E785 Hyperlipidemia, unspecified: Secondary | ICD-10-CM | POA: Diagnosis present

## 2015-01-30 DIAGNOSIS — E1165 Type 2 diabetes mellitus with hyperglycemia: Secondary | ICD-10-CM | POA: Diagnosis present

## 2015-01-30 DIAGNOSIS — E860 Dehydration: Secondary | ICD-10-CM | POA: Diagnosis present

## 2015-01-30 DIAGNOSIS — I5032 Chronic diastolic (congestive) heart failure: Secondary | ICD-10-CM | POA: Diagnosis not present

## 2015-01-30 DIAGNOSIS — S82899A Other fracture of unspecified lower leg, initial encounter for closed fracture: Secondary | ICD-10-CM

## 2015-01-30 DIAGNOSIS — Z8673 Personal history of transient ischemic attack (TIA), and cerebral infarction without residual deficits: Secondary | ICD-10-CM

## 2015-01-30 DIAGNOSIS — D509 Iron deficiency anemia, unspecified: Secondary | ICD-10-CM | POA: Diagnosis present

## 2015-01-30 DIAGNOSIS — S82891A Other fracture of right lower leg, initial encounter for closed fracture: Secondary | ICD-10-CM

## 2015-01-30 DIAGNOSIS — W19XXXA Unspecified fall, initial encounter: Secondary | ICD-10-CM | POA: Diagnosis present

## 2015-01-30 DIAGNOSIS — I340 Nonrheumatic mitral (valve) insufficiency: Secondary | ICD-10-CM | POA: Diagnosis present

## 2015-01-30 DIAGNOSIS — Z952 Presence of prosthetic heart valve: Secondary | ICD-10-CM

## 2015-01-30 DIAGNOSIS — I447 Left bundle-branch block, unspecified: Secondary | ICD-10-CM | POA: Diagnosis present

## 2015-01-30 DIAGNOSIS — Z8619 Personal history of other infectious and parasitic diseases: Secondary | ICD-10-CM | POA: Diagnosis not present

## 2015-01-30 DIAGNOSIS — I129 Hypertensive chronic kidney disease with stage 1 through stage 4 chronic kidney disease, or unspecified chronic kidney disease: Secondary | ICD-10-CM | POA: Diagnosis present

## 2015-01-30 DIAGNOSIS — Z6822 Body mass index (BMI) 22.0-22.9, adult: Secondary | ICD-10-CM | POA: Diagnosis not present

## 2015-01-30 DIAGNOSIS — K5900 Constipation, unspecified: Secondary | ICD-10-CM | POA: Diagnosis present

## 2015-01-30 DIAGNOSIS — Z9842 Cataract extraction status, left eye: Secondary | ICD-10-CM | POA: Diagnosis not present

## 2015-01-30 DIAGNOSIS — I454 Nonspecific intraventricular block: Secondary | ICD-10-CM | POA: Diagnosis present

## 2015-01-30 DIAGNOSIS — E1129 Type 2 diabetes mellitus with other diabetic kidney complication: Secondary | ICD-10-CM | POA: Diagnosis present

## 2015-01-30 DIAGNOSIS — Z01810 Encounter for preprocedural cardiovascular examination: Secondary | ICD-10-CM | POA: Diagnosis not present

## 2015-01-30 DIAGNOSIS — Z888 Allergy status to other drugs, medicaments and biological substances status: Secondary | ICD-10-CM

## 2015-01-30 DIAGNOSIS — I739 Peripheral vascular disease, unspecified: Secondary | ICD-10-CM | POA: Diagnosis present

## 2015-01-30 DIAGNOSIS — E1121 Type 2 diabetes mellitus with diabetic nephropathy: Secondary | ICD-10-CM

## 2015-01-30 DIAGNOSIS — S82409A Unspecified fracture of shaft of unspecified fibula, initial encounter for closed fracture: Secondary | ICD-10-CM

## 2015-01-30 DIAGNOSIS — Z953 Presence of xenogenic heart valve: Secondary | ICD-10-CM

## 2015-01-30 DIAGNOSIS — M25579 Pain in unspecified ankle and joints of unspecified foot: Secondary | ICD-10-CM | POA: Diagnosis present

## 2015-01-30 DIAGNOSIS — Z794 Long term (current) use of insulin: Secondary | ICD-10-CM

## 2015-01-30 DIAGNOSIS — I5042 Chronic combined systolic (congestive) and diastolic (congestive) heart failure: Secondary | ICD-10-CM | POA: Diagnosis present

## 2015-01-30 DIAGNOSIS — I5022 Chronic systolic (congestive) heart failure: Secondary | ICD-10-CM | POA: Diagnosis not present

## 2015-01-30 DIAGNOSIS — I509 Heart failure, unspecified: Secondary | ICD-10-CM | POA: Diagnosis not present

## 2015-01-30 DIAGNOSIS — Z7982 Long term (current) use of aspirin: Secondary | ICD-10-CM

## 2015-01-30 DIAGNOSIS — Z954 Presence of other heart-valve replacement: Secondary | ICD-10-CM | POA: Diagnosis not present

## 2015-01-30 HISTORY — DX: Unspecified fracture of shaft of unspecified fibula, initial encounter for closed fracture: S82.409A

## 2015-01-30 LAB — CBC WITH DIFFERENTIAL/PLATELET
BASOS PCT: 0 % (ref 0–1)
Basophils Absolute: 0 10*3/uL (ref 0.0–0.1)
EOS ABS: 0.3 10*3/uL (ref 0.0–0.7)
Eosinophils Relative: 3 % (ref 0–5)
HEMATOCRIT: 31.1 % — AB (ref 36.0–46.0)
HEMOGLOBIN: 10.7 g/dL — AB (ref 12.0–15.0)
Lymphocytes Relative: 8 % — ABNORMAL LOW (ref 12–46)
Lymphs Abs: 0.7 10*3/uL (ref 0.7–4.0)
MCH: 29.8 pg (ref 26.0–34.0)
MCHC: 34.4 g/dL (ref 30.0–36.0)
MCV: 86.6 fL (ref 78.0–100.0)
MONOS PCT: 5 % (ref 3–12)
Monocytes Absolute: 0.4 10*3/uL (ref 0.1–1.0)
Neutro Abs: 7.6 10*3/uL (ref 1.7–7.7)
Neutrophils Relative %: 84 % — ABNORMAL HIGH (ref 43–77)
PLATELETS: 195 10*3/uL (ref 150–400)
RBC: 3.59 MIL/uL — ABNORMAL LOW (ref 3.87–5.11)
RDW: 13.1 % (ref 11.5–15.5)
WBC: 9 10*3/uL (ref 4.0–10.5)

## 2015-01-30 LAB — CBG MONITORING, ED
Glucose-Capillary: >600 mg/dL (ref 70–99)
Glucose-Capillary: 591 mg/dL (ref 70–99)
Glucose-Capillary: 497 mg/dL — ABNORMAL HIGH (ref 70–99)
Glucose-Capillary: 366 mg/dL — ABNORMAL HIGH (ref 70–99)
Glucose-Capillary: 263 mg/dL — ABNORMAL HIGH (ref 70–99)

## 2015-01-30 LAB — COMPREHENSIVE METABOLIC PANEL
ALT: 20 U/L (ref 0–35)
ANION GAP: 14 (ref 5–15)
AST: 21 U/L (ref 0–37)
Albumin: 3.8 g/dL (ref 3.5–5.2)
Alkaline Phosphatase: 46 U/L (ref 39–117)
BILIRUBIN TOTAL: 1 mg/dL (ref 0.3–1.2)
BUN: 45 mg/dL — AB (ref 6–23)
CHLORIDE: 89 mmol/L — AB (ref 96–112)
CO2: 25 mmol/L (ref 19–32)
CREATININE: 1.7 mg/dL — AB (ref 0.50–1.10)
Calcium: 10.5 mg/dL (ref 8.4–10.5)
GFR calc Af Amer: 31 mL/min — ABNORMAL LOW (ref >90)
GFR, EST NON AFRICAN AMERICAN: 27 mL/min — AB (ref >90)
Glucose, Bld: 741 mg/dL (ref 70–99)
Potassium: 4.1 mmol/L (ref 3.5–5.1)
Sodium: 128 mmol/L — ABNORMAL LOW (ref 135–145)
Total Protein: 6.2 g/dL (ref 6.0–8.3)

## 2015-01-30 LAB — URINE MICROSCOPIC-ADD ON

## 2015-01-30 LAB — URINALYSIS, ROUTINE W REFLEX MICROSCOPIC
Bilirubin Urine: NEGATIVE
Glucose, UA: >1000 mg/dL — AB
Hgb urine dipstick: NEGATIVE
Ketones, ur: NEGATIVE mg/dL
Leukocytes, UA: NEGATIVE
NITRITE: NEGATIVE
PH: 6.5 (ref 5.0–8.0)
Protein, ur: NEGATIVE mg/dL
SPECIFIC GRAVITY, URINE: 1.018 (ref 1.005–1.030)
UROBILINOGEN UA: 0.2 mg/dL (ref 0.0–1.0)

## 2015-01-30 LAB — BASIC METABOLIC PANEL
Anion gap: 7 (ref 5–15)
BUN: 35 mg/dL — ABNORMAL HIGH (ref 6–23)
CO2: 29 mmol/L (ref 19–32)
Calcium: 9.7 mg/dL (ref 8.4–10.5)
Chloride: 101 mmol/L (ref 96–112)
Creatinine, Ser: 1.45 mg/dL — ABNORMAL HIGH (ref 0.50–1.10)
GFR calc Af Amer: 38 mL/min — ABNORMAL LOW (ref >90)
GFR calc non Af Amer: 33 mL/min — ABNORMAL LOW (ref >90)
Glucose, Bld: 93 mg/dL (ref 70–99)
Potassium: 3.2 mmol/L — ABNORMAL LOW (ref 3.5–5.1)
Sodium: 137 mmol/L (ref 135–145)

## 2015-01-30 LAB — GLUCOSE, CAPILLARY
GLUCOSE-CAPILLARY: 220 mg/dL — AB (ref 70–99)
Glucose-Capillary: 185 mg/dL — ABNORMAL HIGH (ref 70–99)
Glucose-Capillary: 89 mg/dL (ref 70–99)
Glucose-Capillary: 95 mg/dL (ref 70–99)

## 2015-01-30 LAB — I-STAT TROPONIN, ED: Troponin i, poc: 0.04 ng/mL (ref 0.00–0.08)

## 2015-01-30 MED ORDER — DOCUSATE SODIUM 100 MG PO CAPS
100.0000 mg | ORAL_CAPSULE | Freq: Two times a day (BID) | ORAL | Status: DC
  Administered 2015-01-30 – 2015-02-02 (×5): 100 mg via ORAL
  Filled 2015-01-30 (×7): qty 1

## 2015-01-30 MED ORDER — DEXTROSE-NACL 5-0.45 % IV SOLN: INTRAVENOUS | Status: DC

## 2015-01-30 MED ORDER — INSULIN ASPART 100 UNIT/ML ~~LOC~~ SOLN
10.0000 [IU] | Freq: Once | SUBCUTANEOUS | Status: AC
  Administered 2015-01-30: 10 [IU] via INTRAVENOUS
  Filled 2015-01-30: qty 1

## 2015-01-30 MED ORDER — SODIUM CHLORIDE 0.9 % IJ SOLN
3.0000 mL | Freq: Two times a day (BID) | INTRAMUSCULAR | Status: DC
  Administered 2015-01-30 – 2015-02-02 (×6): 3 mL via INTRAVENOUS

## 2015-01-30 MED ORDER — LOSARTAN POTASSIUM 50 MG PO TABS
50.0000 mg | ORAL_TABLET | Freq: Every day | ORAL | Status: DC
  Filled 2015-01-30: qty 1

## 2015-01-30 MED ORDER — INSULIN REGULAR BOLUS VIA INFUSION
0.0000 [IU] | Freq: Three times a day (TID) | INTRAVENOUS | Status: DC
  Filled 2015-01-30: qty 10

## 2015-01-30 MED ORDER — SIMVASTATIN 20 MG PO TABS
20.0000 mg | ORAL_TABLET | Freq: Every evening | ORAL | Status: DC
  Administered 2015-01-30 – 2015-02-01 (×2): 20 mg via ORAL
  Filled 2015-01-30 (×4): qty 1

## 2015-01-30 MED ORDER — DEXTROSE-NACL 5-0.45 % IV SOLN
INTRAVENOUS | Status: DC
  Administered 2015-01-30: 15:00:00 via INTRAVENOUS

## 2015-01-30 MED ORDER — SODIUM CHLORIDE 0.9 % IV BOLUS (SEPSIS): 1000.0000 mL | Freq: Once | INTRAVENOUS | Status: DC

## 2015-01-30 MED ORDER — ASPIRIN EC 81 MG PO TBEC
81.0000 mg | DELAYED_RELEASE_TABLET | Freq: Every day | ORAL | Status: DC
  Administered 2015-01-30 – 2015-02-02 (×4): 81 mg via ORAL
  Filled 2015-01-30 (×4): qty 1

## 2015-01-30 MED ORDER — FERROUS SULFATE 325 (65 FE) MG PO TABS
325.0000 mg | ORAL_TABLET | Freq: Two times a day (BID) | ORAL | Status: DC
  Filled 2015-01-30: qty 1

## 2015-01-30 MED ORDER — MORPHINE SULFATE 4 MG/ML IJ SOLN
4.0000 mg | Freq: Once | INTRAMUSCULAR | Status: AC
  Administered 2015-01-30: 4 mg via INTRAVENOUS
  Filled 2015-01-30: qty 1

## 2015-01-30 MED ORDER — POTASSIUM CHLORIDE 10 MEQ/100ML IV SOLN
10.0000 meq | INTRAVENOUS | Status: AC
  Administered 2015-01-30 – 2015-01-31 (×4): 10 meq via INTRAVENOUS
  Filled 2015-01-30 (×4): qty 100

## 2015-01-30 MED ORDER — CEFAZOLIN SODIUM-DEXTROSE 2-3 GM-% IV SOLR
2.0000 g | INTRAVENOUS | Status: AC
  Filled 2015-01-30 (×2): qty 50

## 2015-01-30 MED ORDER — SODIUM CHLORIDE 0.9 % IV BOLUS (SEPSIS)
2000.0000 mL | Freq: Once | INTRAVENOUS | Status: AC
  Administered 2015-01-30: 2000 mL via INTRAVENOUS

## 2015-01-30 MED ORDER — SODIUM CHLORIDE 0.9 % IV SOLN
INTRAVENOUS | Status: AC
  Administered 2015-01-30: 16:00:00 via INTRAVENOUS

## 2015-01-30 MED ORDER — SODIUM CHLORIDE 0.9 % IV SOLN
INTRAVENOUS | Status: DC
  Administered 2015-01-30: 4.4 [IU]/h via INTRAVENOUS
  Filled 2015-01-30: qty 2.5

## 2015-01-30 MED ORDER — SODIUM CHLORIDE 0.9 % IV SOLN: 1000.0000 mL | INTRAVENOUS | Status: DC

## 2015-01-30 MED ORDER — FERROUS SULFATE 325 (65 FE) MG PO TABS
325.0000 mg | ORAL_TABLET | Freq: Two times a day (BID) | ORAL | Status: DC
  Administered 2015-01-31 – 2015-02-02 (×4): 325 mg via ORAL
  Filled 2015-01-30 (×7): qty 1

## 2015-01-30 MED ORDER — MORPHINE SULFATE 2 MG/ML IJ SOLN
2.0000 mg | Freq: Once | INTRAMUSCULAR | Status: AC
  Administered 2015-01-30: 2 mg via INTRAVENOUS
  Filled 2015-01-30: qty 1

## 2015-01-30 MED ORDER — DEXTROSE 50 % IV SOLN
25.0000 mL | INTRAVENOUS | Status: DC | PRN
  Administered 2015-01-31: 25 mL via INTRAVENOUS
  Filled 2015-01-30 (×2): qty 50

## 2015-01-30 MED ORDER — INSULIN GLARGINE 100 UNIT/ML ~~LOC~~ SOLN
30.0000 [IU] | Freq: Every day | SUBCUTANEOUS | Status: DC
  Administered 2015-01-30: 30 [IU] via SUBCUTANEOUS
  Filled 2015-01-30: qty 0.3

## 2015-01-30 NOTE — ED Notes (Signed)
Unable to get actual weight on pt due to right ankle fracture. Will notify floor to weigh when pt is transferred to unit bed.

## 2015-01-30 NOTE — Consult Note (Signed)
Reason for Consult:right ankle fracture Referring Physician: Cruzita Lederer MD  Joanna [name] is an 81 y.o. female.  HPI: fall with acute right ankle pain and inability to ambulate  Past Medical History  Diagnosis Date  . Peripheral artery disease   . Hyperlipidemia     takes Simvastatin daily  . PONV (postoperative nausea and vomiting)   . Shortness of breath   . Arthritis   . Aortic stenosis 09/21/2011  . AORTIC VALVE SCLEROSIS 02/21/2010  . 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 BUNDLE BRANCH BLOCK, LEFT 07/30/2007  . CAROTID ARTERY STENOSIS 01/05/2009  . CVA 03/14/2010  . 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 PERIPHERAL VASCULAR DISEASE 07/30/2007  . POLYPECTOMY, HX OF 07/30/2007  . Constipation   . Chronic combined systolic and diastolic CHF (congestive heart failure)     a. [place] 09/2011 RA 8, RV 58/2/11; PA 54/22 (37) PCWP 20; F CO/CI 4.57/2.67 PVR 3.7; b. L main no sig dz, LAD luminal irreg; LCx lg Ramus with luminal irreg, small AV Lcx witout sig dz, RCA luminal irreg; severe mitral annular calcifications; AV calcified c. EF 45-50% (07/2013);  d. 11/2014 Echo: EF 20-25%, diff HK, nl AV/MV, sev dil LA, mod TR/PR, PASP 80mHg.  . Diabetes mellitus   . DM 07/30/2007  . HTN (hypertension)   . HYPERTENSION 07/30/2007  . Weakness   . Dizziness   . Joint pain   . Itching   . Anemia   . Iron deficiency anemia 09/21/2011  . Nocturia   . History of blood transfusion   . History of shingles   . Mitral regurgitation   . S/P aortic valve replacement with bioprosthetic valve 06/15/2013    [facility]  . S/P mitral valve replacement with bioprosthetic valve 06/15/2013    [facility]  . Fracture, fibula, shaft 01/30/2015    right    Past Surgical History  Procedure Laterality Date  . Carotid endarterectomy Left 2011  . Polypectomy  12/27/04  . Cataract extraction Bilateral 2009  . Tee without cardioversion  N/A 05/19/2013    Procedure: TRANSESOPHAGEAL ECHOCARDIOGRAM (TEE);  Surgeon: DLarey Dresser MD;  [place]  Service: Cardiovascular;  Laterality: N/A;  . Colonoscopy    . Cardiac catheterization  2012  . Aortic valve replacement N/A 06/15/2013    Procedure: AORTIC VALVE REPLACEMENT (AVR);  Surgeon: CRexene Alberts MD;  [place] [facility]  Service: Open Heart Surgery;  Laterality: N/A;  . Intraoperative transesophageal echocardiogram N/A 06/15/2013    Procedure: INTRAOPERATIVE TRANSESOPHAGEAL ECHOCARDIOGRAM;  Surgeon: CRexene Alberts MD;  [place]  Service: Open Heart Surgery;  Laterality: N/A;  . Mitral valve replacement N/A 06/15/2013    Procedure: MITRAL VALVE (MV) REPLACEMENT;  Surgeon: CRexene Alberts MD;  [place] [facility]  Service: Open Heart Surgery;  Laterality: N/A;  . Epicardial pacing lead placement N/A 06/15/2013    Procedure: EPICARDIAL PACING LEAD PLACEMENT;  Surgeon: CRexene Alberts MD;  [place]  Service: Open Heart Surgery;  Laterality: N/A;  . Left and right heart catheterization with coronary angiogram N/A 09/19/2011    Procedure: LEFT AND RIGHT HEART CATHETERIZATION WITH CORONARY ANGIOGRAM;  Surgeon: DJolaine Artist MD;  [facility]  Service: Cardiovascular;  Laterality: N/A;    Family History  Problem Relation Age of Onset  . Colon cancer Mother   . Diabetes Neg Hx   . Coronary artery disease Neg Hx  Social History:  reports that she has never smoked. She has never used smokeless tobacco. She reports that she does not drink alcohol or use illicit drugs.  Allergies:  Allergies  Allergen Reactions  . Other Hives    STEROIDS  . Prednisone Hives and Other (See Comments)    She is allergic to all steroids!    Medications: I have reviewed the patients medication  Results for orders placed or performed during the hospital encounter of 01/30/15 (from the past 48 hour(s))  CBG monitoring, ED     Status: Abnormal    Collection Time: 01/30/15 12:13 PM  Result Value Ref Range   Glucose-Capillary >600 (HH) 70 - 99 mg/dL   Comment 1 Notify RN    Comment 2 Document in Chart   CBC with Differential/Platelet     Status: Abnormal   Collection Time: 01/30/15  1:44 PM  Result Value Ref Range   WBC 9.0 4.0 - 10.5 K/uL   RBC 3.59 (L) 3.87 - 5.11 MIL/uL   Hemoglobin 10.7 (L) 12.0 - 15.0 g/dL   HCT 31.1 (L) 36.0 - 46.0 %   MCV 86.6 78.0 - 100.0 fL   MCH 29.8 26.0 - 34.0 pg   MCHC 34.4 30.0 - 36.0 g/dL   RDW 13.1 11.5 - 15.5 %   Platelets 195 150 - 400 K/uL   Neutrophils Relative % 84 (H) 43 - 77 %   Neutro Abs 7.6 1.7 - 7.7 K/uL   Lymphocytes Relative 8 (L) 12 - 46 %   Lymphs Abs 0.7 0.7 - 4.0 K/uL   Monocytes Relative 5 3 - 12 %   Monocytes Absolute 0.4 0.1 - 1.0 K/uL   Eosinophils Relative 3 0 - 5 %   Eosinophils Absolute 0.3 0.0 - 0.7 K/uL   Basophils Relative 0 0 - 1 %   Basophils Absolute 0.0 0.0 - 0.1 K/uL  Comprehensive metabolic panel     Status: Abnormal   Collection Time: 01/30/15  1:44 PM  Result Value Ref Range   Sodium 128 (L) 135 - 145 mmol/L   Potassium 4.1 3.5 - 5.1 mmol/L   Chloride 89 (L) 96 - 112 mmol/L   CO2 25 19 - 32 mmol/L   Glucose, Bld 741 (HH) 70 - 99 mg/dL    Comment: REPEATED TO VERIFY CRITICAL RESULT CALLED TO, READ BACK BY AND VERIFIED WITH: BEASLEY,M RN @ 1424 01/30/15 LEONARD,A    BUN 45 (H) 6 - 23 mg/dL   Creatinine, Ser 1.70 (H) 0.50 - 1.10 mg/dL   Calcium 10.5 8.4 - 10.5 mg/dL   Total Protein 6.2 6.0 - 8.3 g/dL   Albumin 3.8 3.5 - 5.2 g/dL   AST 21 0 - 37 U/L   ALT 20 0 - 35 U/L   Alkaline Phosphatase 46 39 - 117 U/L   Total Bilirubin 1.0 0.3 - 1.2 mg/dL   GFR calc non Af Amer 27 (L) >90 mL/min   GFR calc Af Amer 31 (L) >90 mL/min    Comment: (NOTE) The eGFR has been calculated using the CKD EPI equation. This calculation has not been validated in all clinical situations. eGFR's persistently <90 mL/min signify possible Chronic Kidney Disease.    Anion  gap 14 5 - 15  I-stat troponin, ED     Status: None   Collection Time: 01/30/15  1:50 PM  Result Value Ref Range   Troponin i, poc 0.04 0.00 - 0.08 ng/mL   Comment 3  Comment: Due to the release kinetics of cTnI, a negative result within the first hours of the onset of symptoms does not rule out myocardial infarction with certainty. If myocardial infarction is still suspected, repeat the test at appropriate intervals.   Urinalysis, Routine w reflex microscopic     Status: Abnormal   Collection Time: 01/30/15  2:44 PM  Result Value Ref Range   Color, Urine YELLOW YELLOW   APPearance CLEAR CLEAR   Specific Gravity, Urine 1.018 1.005 - 1.030   pH 6.5 5.0 - 8.0   Glucose, UA >1000 (A) NEGATIVE mg/dL   Hgb urine dipstick NEGATIVE NEGATIVE   Bilirubin Urine NEGATIVE NEGATIVE   Ketones, ur NEGATIVE NEGATIVE mg/dL   Protein, ur NEGATIVE NEGATIVE mg/dL   Urobilinogen, UA 0.2 0.0 - 1.0 mg/dL   Nitrite NEGATIVE NEGATIVE   Leukocytes, UA NEGATIVE NEGATIVE  Urine microscopic-add on     Status: None   Collection Time: 01/30/15  2:44 PM  Result Value Ref Range   Squamous Epithelial / LPF RARE RARE   WBC, UA 0-2 <3 WBC/hpf   Bacteria, UA RARE RARE  CBG monitoring, ED     Status: Abnormal   Collection Time: 01/30/15  2:58 PM  Result Value Ref Range   Glucose-Capillary 591 (HH) 70 - 99 mg/dL   Comment 1 Notify RN    Comment 2 Document in Chart   CBG monitoring, ED     Status: Abnormal   Collection Time: 01/30/15  3:52 PM  Result Value Ref Range   Glucose-Capillary 497 (H) 70 - 99 mg/dL   Comment 1 Notify RN    Comment 2 Document in Chart   CBG monitoring, ED     Status: Abnormal   Collection Time: 01/30/15  4:55 PM  Result Value Ref Range   Glucose-Capillary 366 (H) 70 - 99 mg/dL  CBG monitoring, ED     Status: Abnormal   Collection Time: 01/30/15  5:48 PM  Result Value Ref Range   Glucose-Capillary 263 (H) 70 - 99 mg/dL  Glucose, capillary     Status: Abnormal    Collection Time: 01/30/15  6:54 PM  Result Value Ref Range   Glucose-Capillary 220 (H) 70 - 99 mg/dL   Comment 1 Notify RN   Glucose, capillary     Status: Abnormal   Collection Time: 01/30/15  7:59 PM  Result Value Ref Range   Glucose-Capillary 185 (H) 70 - 99 mg/dL  Glucose, capillary     Status: None   Collection Time: 01/30/15  9:04 PM  Result Value Ref Range   Glucose-Capillary 89 70 - 99 mg/dL   Comment 1 Notify RN    Comment 2 Document in Chart   Basic metabolic panel     Status: Abnormal   Collection Time: 01/30/15 10:09 PM  Result Value Ref Range   Sodium 137 135 - 145 mmol/L   Potassium 3.2 (L) 3.5 - 5.1 mmol/L   Chloride 101 96 - 112 mmol/L   CO2 29 19 - 32 mmol/L   Glucose, Bld 93 70 - 99 mg/dL   BUN 35 (H) 6 - 23 mg/dL   Creatinine, Ser 1.45 (H) 0.50 - 1.10 mg/dL   Calcium 9.7 8.4 - 10.5 mg/dL   GFR calc non Af Amer 33 (L) >90 mL/min   GFR calc Af Amer 38 (L) >90 mL/min    Comment: (NOTE) The eGFR has been calculated using the CKD EPI equation. This calculation has not been validated in all  clinical situations. eGFR's persistently <90 mL/min signify possible Chronic Kidney Disease.    Anion gap 7 5 - 15  Glucose, capillary     Status: None   Collection Time: 01/30/15 10:17 PM  Result Value Ref Range   Glucose-Capillary 95 70 - 99 mg/dL    Dg Tibia/fibula Right  01/30/2015   CLINICAL DATA:  Fall today, injury, pain, swelling and distal right tibia/ankle.  EXAM: RIGHT TIBIA AND FIBULA - 2 VIEW  COMPARISON:  None.  FINDINGS: There is an oblique comminuted fracture through the distal shaft of the right fibula. Transverse fracture at the base of the medial malleolus, better seen on the ankle series.  IMPRESSION: Mildly comminuted distal fibular shaft fracture. Transverse medial malleolar fracture.   Electronically Signed   By: Rolm Baptise M.D.   On: 01/30/2015 13:43   Dg Ankle Complete Right  01/30/2015   CLINICAL DATA:  Pain and swelling following fall  EXAM:  RIGHT ANKLE - COMPLETE 3+ VIEW  COMPARISON:  None.  FINDINGS: Frontal, oblique, and lateral views obtained. There is a comminuted fracture of the medial malleolus with mildly displaced fracture fragments. There is an obliquely oriented fracture of the distal fibular diaphysis, comminuted. Major fracture fragments are in near anatomic alignment. The ankle mortise appears grossly intact. There is no appreciable joint effusion. There is osteoarthritic change with spurring in the dorsal midfoot region. There is a posterior calcaneal spur. There are multiple vascular calcifications.  IMPRESSION: Comminuted fractures of the medial malleolus and distal fibular diaphysis. Ankle mortise appears grossly intact.   Electronically Signed   By: Lowella Grip III M.D.   On: 01/30/2015 13:43    Review of Systems  Constitutional: Negative for fever and chills.  Cardiovascular:       Hx of heart failure, valve replacement. Carotid endarterectomy  Neurological: Positive for dizziness.  Endo/Heme/Allergies:       Diabetes   Blood pressure 91/54, pulse 80, temperature 98.9 F (37.2 C), temperature source Oral, resp. rate 16, height _0  (1.575 m), weight 54.3 kg (119 lb 11.4 oz), SpO2 100 %. Physical Exam  Constitutional: She is oriented to person, place, and time. She appears well-developed.  HENT:  Head: Atraumatic.  Eyes: Pupils are equal, round, and reactive to light.  Neck: Normal range of motion.  Cardiovascular: Normal rate.   Respiratory: Effort normal.  GI: Soft.  Musculoskeletal:  Right ankle splinted. Cap refill normal  Neurological: She is alert and oriented to person, place, and time.  Skin: Skin is warm and dry.  Psychiatric: She has a normal mood and affect.    Assessment/Plan: Right ankle bimalleolar fracture with displacement. Will place on schedule tomorrow at about 5 pm . May be better candidate for regional anesthesia with cardiac Hx.       My phone [phone]  Marybelle Killings 01/30/2015, 11:10 PM

## 2015-01-30 NOTE — Progress Notes (Signed)
Orthopedic Tech Progress Note Patient Details:  Joanna [name] 03/19/1930 [id number]  Ortho Devices Type of Ortho Device: Ace wrap, Arm sling, Post (short leg) splint, Stirrup splint Ortho Device/Splint Location: rle Ortho Device/Splint Interventions: Application Arm sling is to be deleted  Nikki Dom 01/30/2015, 2:29 PM

## 2015-01-30 NOTE — ED Notes (Signed)
CBG Exceeds Measure. Notified nurse Molli Hazard.

## 2015-01-30 NOTE — ED Notes (Signed)
Per EMS, pt has been having multiple falls at home due to light headedness. Pt had fall Sunday resulting in right ankle injury. Pt has right ankle swelling PMS intact. Pt alert x4. Pt also has hyperglycemia with CBG greater than 550. NAD at this time.

## 2015-01-30 NOTE — ED Provider Notes (Signed)
CSN: [id number]     Arrival date & time 01/30/15  1203 History   First MD Initiated Contact with Patient 01/30/15 1220     Chief Complaint  Patient presents with  . Near Syncope  . Fall  . Ankle Pain      HPI Patient presents to the emergency department from home.  She's had multiple falls over the past 24 hours secondary to lightheadedness.  Today she fell and reports moderate to severe pain in her right ankle.  She reports mild neck pain and mild headache.  She denies significant head trauma.  She is not on anticoagulants.  Her blood sugar was noted to be greater than 600 on arrival to the emergency department.  She states she was recently started on insulin but she has not been taking this as she's just been taking the pills.  She denies nausea vomiting or diarrhea.  No fevers or chills.  She reports urinary frequency without dysuria.   Past Medical History  Diagnosis Date  . Peripheral artery disease   . Hyperlipidemia     takes Simvastatin daily  . PONV (postoperative nausea and vomiting)   . Shortness of breath   . Arthritis   . Aortic stenosis 09/21/2011  . AORTIC VALVE SCLEROSIS 02/21/2010  . 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 BUNDLE BRANCH BLOCK, LEFT 07/30/2007  . CAROTID ARTERY STENOSIS 01/05/2009  . CVA 03/14/2010  . 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 PERIPHERAL VASCULAR DISEASE 07/30/2007  . POLYPECTOMY, HX OF 07/30/2007  . Constipation   . Chronic combined systolic and diastolic CHF (congestive heart failure)     a. RHC 09/2011 RA 8, RV 58/2/11; PA 54/22 (37) PCWP 20; F CO/CI 4.57/2.67 PVR 3.7; b. L main no sig dz, LAD luminal irreg; LCx lg Ramus with luminal irreg, small AV Lcx witout sig dz, RCA luminal irreg; severe mitral annular calcifications; AV calcified c. EF 45-50% (07/2013);  d. 11/2014 Echo: EF 20-25%, diff HK, nl AV/MV, sev dil LA, mod TR/PR, PASP .  . Diabetes mellitus   . DM 07/30/2007  . HTN (hypertension)   . HYPERTENSION 07/30/2007  . Weakness   .  Dizziness   . Joint pain   . Itching   . Anemia   . Iron deficiency anemia 09/21/2011  . Nocturia   . History of blood transfusion   . History of shingles   . Mitral regurgitation   . S/P aortic valve replacement with bioprosthetic valve 06/15/2013    21 mm [facility] Ease bovine pericardial tissue valve  . S/P mitral valve replacement with bioprosthetic valve 06/15/2013    25 mm [facility] Mitral bovine pericardial tissue valve   Past Surgical History  Procedure Laterality Date  . Carotid endarterectomy Left 2011  . Polypectomy  12/27/04  . Cataract extraction Bilateral 2009  . Tee without cardioversion N/A 05/19/2013    Procedure: TRANSESOPHAGEAL ECHOCARDIOGRAM (TEE);  Surgeon: Laurey Morale, MD;  Location: [facility] ENDOSCOPY;  Service: Cardiovascular;  Laterality: N/A;  . Colonoscopy    . Cardiac catheterization  2012  . Aortic valve replacement N/A 06/15/2013    Procedure: AORTIC VALVE REPLACEMENT (AVR);  Surgeon: Purcell Nails, MD;  Location: [facility] OR;  Service: Open Heart Surgery;  Laterality: N/A;  . Intraoperative transesophageal echocardiogram N/A 06/15/2013    Procedure: INTRAOPERATIVE TRANSESOPHAGEAL ECHOCARDIOGRAM;  Surgeon: Purcell Nails, MD;  Location: [facility] OR;  Service: Open Heart Surgery;  Laterality: N/A;  . Mitral valve replacement N/A 06/15/2013    Procedure: MITRAL  VALVE (MV) REPLACEMENT;  Surgeon: Purcell Nails, MD;  Location: [facility] OR;  Service: Open Heart Surgery;  Laterality: N/A;  . Epicardial pacing lead placement N/A 06/15/2013    Procedure: EPICARDIAL PACING LEAD PLACEMENT;  Surgeon: Purcell Nails, MD;  Location: [facility] OR;  Service: Open Heart Surgery;  Laterality: N/A;  . Left and right heart catheterization with coronary angiogram N/A 09/19/2011    Procedure: LEFT AND RIGHT HEART CATHETERIZATION WITH CORONARY ANGIOGRAM;  Surgeon: Dolores Patty, MD;  Location: [facility] CATH LAB;  Service: Cardiovascular;  Laterality: N/A;   Family History  Problem Relation Age of  Onset  . Colon cancer Mother   . Diabetes Neg Hx   . Coronary artery disease Neg Hx    History  Substance Use Topics  . Smoking status: Never Smoker   . Smokeless tobacco: Never Used  . Alcohol Use: No   OB History    No data available     Review of Systems  All other systems reviewed and are negative.     Allergies  Other and Prednisone  Home Medications   Prior to Admission medications   Medication Sig Start Date End Date Taking? Authorizing Provider  aspirin EC 81 MG tablet Take 1 tablet (81 mg total) by mouth daily. 07/10/14   Dolores Patty, MD  calcium citrate-vitamin D (CITRACAL+D) 315-200 MG-UNIT per tablet Take 1 tablet by mouth daily.    Historical Provider, MD  docusate sodium (COLACE) 100 MG capsule Take 100 mg by mouth 2 (two) times daily.      Historical Provider, MD  ferrous sulfate 325 (65 FE) MG tablet Take 1 tablet (325 mg total) by mouth 2 (two) times daily with a meal. 02/28/14   Etta Grandchild, MD  furosemide (LASIX) 40 MG tablet Take 1 tablet (40 mg total) by mouth 2 (two) times daily. 12/15/14   Ok Anis, NP  Insulin Glargine (TOUJEO SOLOSTAR) 300 UNIT/ML SOPN Inject 30 Units into the skin daily. 12/29/14   Etta Grandchild, MD  Insulin Pen Needle (NOVOFINE) 32G X 6 MM MISC Use daily with insulin 12/29/14   Etta Grandchild, MD  losartan (COZAAR) 50 MG tablet Take 1 tablet (50 mg total) by mouth daily. Please cancel all previous orders for current medication. Change in dosage or pill size. 12/21/14   Dolores Patty, MD  metolazone (ZAROXOLYN) 2.5 MG tablet Take 1 tablet (2.5 mg total) by mouth once a week. On Tuesday 30 min prior to morning Lasix, 12/25/14   Laurey Morale, MD  potassium chloride SA (K-DUR,KLOR-CON) 20 MEQ tablet Take 1 tablet (20 mEq total) by mouth daily. 12/21/14   Dolores Patty, MD  simvastatin (ZOCOR) 20 MG tablet Take 1 tablet (20 mg total) by mouth every evening. 02/28/14   Etta Grandchild, MD   BP 94/76 mmHg  Temp(Src)  97.9 F (36.6 C) (Oral)  Resp 12  SpO2 100% Physical Exam  Constitutional: She is oriented to person, place, and time. She appears well-developed and well-nourished. No distress.  HENT:  Head: Normocephalic and atraumatic.  Eyes: EOM are normal.  Neck: Neck supple.  Cervical and paracervical tenderness without cervical step-offs.  Cardiovascular: Normal rate, regular rhythm and normal heart sounds.   Pulmonary/Chest: Effort normal and breath sounds normal.  Abdominal: Soft. She exhibits no distension. There is no tenderness.  Musculoskeletal: Normal range of motion.  Full range of motion of bilateral hips and knees.  Limited range of motion of  right ankle secondary to pain with tenderness over the right medial malleolus.  Neurological: She is alert and oriented to person, place, and time.  Skin: Skin is warm and dry.  Psychiatric: She has a normal mood and affect. Judgment normal.  Nursing note and vitals reviewed.   ED Course  Procedures (including critical care time)  SPLINT APPLICATION Authorized by: Lyanne Co Consent: Verbal consent obtained. Risks and benefits: risks, benefits and alternatives were discussed Consent given by: patient Splint applied by: orthopedic technician Location details: right ankle Splint type: right short leg with stirrup Supplies used: ortho-glass Post-procedure: The splinted body part was neurovascularly unchanged following the procedure. Patient tolerance: Patient tolerated the procedure well with no immediate complications.    Labs Review Labs Reviewed  CBC WITH DIFFERENTIAL/PLATELET - Abnormal; Notable for the following:    RBC 3.59 (*)    Hemoglobin 10.7 (*)    HCT 31.1 (*)    Neutrophils Relative % 84 (*)    Lymphocytes Relative 8 (*)    All other components within normal limits  COMPREHENSIVE METABOLIC PANEL - Abnormal; Notable for the following:    Sodium 128 (*)    Chloride 89 (*)    Glucose, Bld 741 (*)    BUN 45 (*)     Creatinine, Ser 1.70 (*)    GFR calc non Af Amer 27 (*)    GFR calc Af Amer 31 (*)    All other components within normal limits  CBG MONITORING, ED - Abnormal; Notable for the following:    Glucose-Capillary >600 (*)    All other components within normal limits  URINE CULTURE  URINALYSIS, ROUTINE W REFLEX MICROSCOPIC  I-STAT TROPOININ, ED    Imaging Review No results found.   EKG Interpretation   Date/Time:  Tuesday January 30 2015 12:14:27 EDT Ventricular Rate:  83 PR Interval:  225 QRS Duration: 173 QT Interval:  441 QTC Calculation: 518 R Axis:   -63 Text Interpretation:  Sinus rhythm Prolonged PR interval Probable left  atrial enlargement Left bundle branch block Probable RV involvement,  suggest recording right precordial leads nonspecific T wave changes  laterally as compred to prior Confirmed by Brennon Otterness  MD, Delmi Fulfer ([id number]) on  01/30/2015 12:24:12 PM      MDM   Final diagnoses:  None    Hyperglycemia likely with volume depletion.  Hydrated in the emergency department.  Her blood sugar is greater than 700 here.  Anion gap is normal.  She will need to be admitted for management of her hyperglycemia.  Hydration will need to be continued.  This will need to be judicious secondary to her history of congestive heart failure.  She does have a right medial malleolus fracture.  I spoke with Dr. Ophelia Charter of orthopedics who will evaluate the patient in the hospital.  She currently has been splinted with a posterior splint and stirrup.      Azalia Bilis, MD 01/30/15 1440

## 2015-01-30 NOTE — ED Notes (Signed)
Paged and spoke to Dr Elvera Lennox about pts BP of 95/52. He said as long as she stays asymptomatic and her systolic stays above 90 that is okay.

## 2015-01-30 NOTE — H&P (Addendum)
History and Physical    Joanna [name]:[id number] DOB: 1930/05/09 DOA: 01/30/2015  Referring physician: Dr. Patria Mane PCP: Sanda Linger, MD  Specialists: Orthopedic surgery, Cardiology / Heart failure team   Chief Complaint: Fall  HPI: Joanna [name] is a 81 y.o. female has a past medical history significant for chronic systolic and diastolic heart failure, ejection fraction 20-25%, followed by Dr. Elray Mcgregor in heart failure clinic, aortic and mitral valve disease status post repair with bioprosthetic valves 2 years ago, type 2 diabetes mellitus, hypertension, hyperlipidemia, presents to the hospital with chief complaint of fall. She denies any chest pain, denies any shortness of breath, denies any loss of consciousness or syncopal episodes. She denies any palpitations. She states that he was a mechanical fall as her right leg just gave out. Of note, her diabetes has been difficult to control recently and her PCP has given her an insulin prescription to be started. Patient however waited for the insulin to be mailed in, and over the last few days, she's been complaining of increased urination and increased thirst as well as increased weakness. She has had no fever or chills. She reports compliance with her heart failure medications. She denies any lower extremity swelling. She denies any lightheadedness or dizziness. She denies any abdominal pain, nausea, vomiting or diarrhea. In the emergency room, patient was found to be hyperglycemic with elevated sugar of greater than 600, she has no acidosis or anion gap. An ankle x-ray showed comminuted fractures of the medial malleolus and distal fibular diaphysis. EDP discussed with Dr. Ophelia Charter from orthopedic surgery, he will come in consultation, per preliminary phone discussion she may need surgery. TRH asked for admission for hyperglycemia.  Review of Systems: As per history of present illness, otherwise 10 point review of system negative  Past Medical  History  Diagnosis Date  . Peripheral artery disease   . Hyperlipidemia     takes Simvastatin daily  . PONV (postoperative nausea and vomiting)   . Shortness of breath   . Arthritis   . Aortic stenosis 09/21/2011  . AORTIC VALVE SCLEROSIS 02/21/2010  . 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 BUNDLE BRANCH BLOCK, LEFT 07/30/2007  . CAROTID ARTERY STENOSIS 01/05/2009  . CVA 03/14/2010  . 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 PERIPHERAL VASCULAR DISEASE 07/30/2007  . POLYPECTOMY, HX OF 07/30/2007  . Constipation   . Chronic combined systolic and diastolic CHF (congestive heart failure)     a. RHC 09/2011 RA 8, RV 58/2/11; PA 54/22 (37) PCWP 20; F CO/CI 4.57/2.67 PVR 3.7; b. L main no sig dz, LAD luminal irreg; LCx lg Ramus with luminal irreg, small AV Lcx witout sig dz, RCA luminal irreg; severe mitral annular calcifications; AV calcified c. EF 45-50% (07/2013);  d. 11/2014 Echo: EF 20-25%, diff HK, nl AV/MV, sev dil LA, mod TR/PR, PASP .  . Diabetes mellitus   . DM 07/30/2007  . HTN (hypertension)   . HYPERTENSION 07/30/2007  . Weakness   . Dizziness   . Joint pain   . Itching   . Anemia   . Iron deficiency anemia 09/21/2011  . Nocturia   . History of blood transfusion   . History of shingles   . Mitral regurgitation   . S/P aortic valve replacement with bioprosthetic valve 06/15/2013    21 mm [facility] Ease bovine pericardial tissue valve  . S/P mitral valve replacement with bioprosthetic valve 06/15/2013    25 mm [facility] Mitral bovine pericardial tissue valve   Past  Surgical History  Procedure Laterality Date  . Carotid endarterectomy Left 2011  . Polypectomy  12/27/04  . Cataract extraction Bilateral 2009  . Tee without cardioversion N/A 05/19/2013    Procedure: TRANSESOPHAGEAL ECHOCARDIOGRAM (TEE);  Surgeon: Laurey Morale, MD;  Location: [facility] ENDOSCOPY;  Service: Cardiovascular;  Laterality: N/A;  . Colonoscopy    . Cardiac catheterization  2012  . Aortic valve replacement  N/A 06/15/2013    Procedure: AORTIC VALVE REPLACEMENT (AVR);  Surgeon: Purcell Nails, MD;  Location: [facility] OR;  Service: Open Heart Surgery;  Laterality: N/A;  . Intraoperative transesophageal echocardiogram N/A 06/15/2013    Procedure: INTRAOPERATIVE TRANSESOPHAGEAL ECHOCARDIOGRAM;  Surgeon: Purcell Nails, MD;  Location: [facility] OR;  Service: Open Heart Surgery;  Laterality: N/A;  . Mitral valve replacement N/A 06/15/2013    Procedure: MITRAL VALVE (MV) REPLACEMENT;  Surgeon: Purcell Nails, MD;  Location: [facility] OR;  Service: Open Heart Surgery;  Laterality: N/A;  . Epicardial pacing lead placement N/A 06/15/2013    Procedure: EPICARDIAL PACING LEAD PLACEMENT;  Surgeon: Purcell Nails, MD;  Location: [facility] OR;  Service: Open Heart Surgery;  Laterality: N/A;  . Left and right heart catheterization with coronary angiogram N/A 09/19/2011    Procedure: LEFT AND RIGHT HEART CATHETERIZATION WITH CORONARY ANGIOGRAM;  Surgeon: Dolores Patty, MD;  Location: [facility] CATH LAB;  Service: Cardiovascular;  Laterality: N/A;   Social History:  reports that she has never smoked. She has never used smokeless tobacco. She reports that she does not drink alcohol or use illicit drugs.  Allergies  Allergen Reactions  . Other Hives    STEROIDS  . Prednisone Hives and Other (See Comments)    She is allergic to all steroids!    Family History  Problem Relation Age of Onset  . Colon cancer Mother   . Diabetes Neg Hx   . Coronary artery disease Neg Hx     Prior to Admission medications   Medication Sig Start Date End Date Taking? Authorizing Provider  aspirin EC 81 MG tablet Take 1 tablet (81 mg total) by mouth daily. 07/10/14   Dolores Patty, MD  calcium citrate-vitamin D (CITRACAL+D) 315-200 MG-UNIT per tablet Take 1 tablet by mouth daily.    Historical Provider, MD  docusate sodium (COLACE) 100 MG capsule Take 100 mg by mouth 2 (two) times daily.      Historical Provider, MD  ferrous sulfate 325 (65 FE) MG tablet  Take 1 tablet (325 mg total) by mouth 2 (two) times daily with a meal. 02/28/14   Etta Grandchild, MD  furosemide (LASIX) 40 MG tablet Take 1 tablet (40 mg total) by mouth 2 (two) times daily. 12/15/14   Ok Anis, NP  Insulin Glargine (TOUJEO SOLOSTAR) 300 UNIT/ML SOPN Inject 30 Units into the skin daily. 12/29/14   Etta Grandchild, MD  Insulin Pen Needle (NOVOFINE) 32G X 6 MM MISC Use daily with insulin 12/29/14   Etta Grandchild, MD  losartan (COZAAR) 50 MG tablet Take 1 tablet (50 mg total) by mouth daily. Please cancel all previous orders for current medication. Change in dosage or pill size. 12/21/14   Dolores Patty, MD  metolazone (ZAROXOLYN) 2.5 MG tablet Take 1 tablet (2.5 mg total) by mouth once a week. On Tuesday 30 min prior to morning Lasix, 12/25/14   Laurey Morale, MD  potassium chloride SA (K-DUR,KLOR-CON) 20 MEQ tablet Take 1 tablet (20 mEq total) by mouth daily. 12/21/14  Dolores Patty, MD  simvastatin (ZOCOR) 20 MG tablet Take 1 tablet (20 mg total) by mouth every evening. 02/28/14   Etta Grandchild, MD   Physical Exam: Filed Vitals:   01/30/15 1245 01/30/15 1345 01/30/15 1430 01/30/15 1445  BP: 106/69 122/60 122/77 123/66  Pulse: 72 86 89 93  Temp:      TempSrc:      Resp: SpO2: 100% 100% 98% 99%     General:  No apparent distress, pleasant caucasian female  Eyes: PERRL, EOMI, no scleral icterus  ENT: moist oropharynx  Neck: supple, no lymphadenopathy  Cardiovascular: regular rate and rhythm, JVP pulsations, trace peripheral edema  Respiratory: CTA biL, good air movement without wheezing, faint bibasilar crackles  Abdomen: soft, non tender to palpation, positive bowel sounds, no guarding, no rebound  Skin: no rashes   Musculoskeletal: normal bulk and tone, no joint swelling  Psychiatric: normal mood and affect  Neurologic: Nonfocal   Labs on Admission:  Basic Metabolic Panel:  Recent Labs Lab 01/30/15 1344  NA 128*  K 4.1    CL 89*  CO2 25  GLUCOSE 741*  BUN 45*  CREATININE 1.70*  CALCIUM 10.5   Liver Function Tests:  Recent Labs Lab 01/30/15 1344  AST 21  ALT 20  ALKPHOS 46  BILITOT 1.0  PROT 6.2  ALBUMIN 3.8   CBC:  Recent Labs Lab 01/30/15 1344  WBC 9.0  NEUTROABS 7.6  HGB 10.7*  HCT 31.1*  MCV 86.6  PLT 195   BNP (last 3 results)  Recent Labs  12/13/14 1240  BNP 3083.2*   CBG:  Recent Labs Lab 01/30/15 1213 01/30/15 1458  GLUCAP >600* 591*   Radiological Exams on Admission: Dg Tibia/fibula Right  01/30/2015   CLINICAL DATA:  Fall today, injury, pain, swelling and distal right tibia/ankle.  EXAM: RIGHT TIBIA AND FIBULA - 2 VIEW  COMPARISON:  None.  FINDINGS: There is an oblique comminuted fracture through the distal shaft of the right fibula. Transverse fracture at the base of the medial malleolus, better seen on the ankle series.  IMPRESSION: Mildly comminuted distal fibular shaft fracture. Transverse medial malleolar fracture.   Electronically Signed   By: Charlett Nose M.D.   On: 01/30/2015 13:43   Dg Ankle Complete Right  01/30/2015   CLINICAL DATA:  Pain and swelling following fall  EXAM: RIGHT ANKLE - COMPLETE 3+ VIEW  COMPARISON:  None.  FINDINGS: Frontal, oblique, and lateral views obtained. There is a comminuted fracture of the medial malleolus with mildly displaced fracture fragments. There is an obliquely oriented fracture of the distal fibular diaphysis, comminuted. Major fracture fragments are in near anatomic alignment. The ankle mortise appears grossly intact. There is no appreciable joint effusion. There is osteoarthritic change with spurring in the dorsal midfoot region. There is a posterior calcaneal spur. There are multiple vascular calcifications.  IMPRESSION: Comminuted fractures of the medial malleolus and distal fibular diaphysis. Ankle mortise appears grossly intact.   Electronically Signed   By: Bretta Bang III M.D.   On: 01/30/2015 13:43    EKG:  Independently reviewed. Sinus rhythm, left bundle branch block  Assessment/Plan Active Problems:   DM (diabetes mellitus), type 2 with renal complications   Hyperlipidemia with target LDL less than 100   Essential hypertension   Iron deficiency anemia   Chronic systolic heart failure   Mitral regurgitation   S/P aortic and mitral valve replacement with bioprosthetic valves   Chronic  diastolic CHF (congestive heart failure)   CKD (chronic kidney disease) stage 3, GFR 30-59 ml/min   PAF (paroxysmal atrial fibrillation)   Hyperglycemia   Hyperglycemic state - patient with significantly elevated CBGs, however she is not acidotic and does not have an anion gap. Will admit patient and place her on an insulin drip/glucose stabilizer, will be very careful with fluids as she has systolic heart failure. We'll hydrate him normal saline at 50 mL per hour for about 6 hours, hopefully by that time her CBGs would have improved and we can discontinue fluids altogether. She will need Lantus and sliding scale coverage at that time. - Pseudohypernatremia noted - plan with fluids discussed directly with RN.  Comminuted distal fibular shaft fracture - orthopedic surgery, Dr. Ophelia Charter was consulted, appreciate input. - will likely need surgical repair  DM - type 2, now insulin dependent. Start s.q insulin when CBGs better.   Combined chronic systolic and diastolic heart failure - she has an ejection fraction of 20%, she currently looks euvolemic however I do not think it takes much for her to become fluid overloaded. In the setting of #1, will not order her Lasix and metolazone. I let the heart failure team know that she is being admitted with the above-mentioned problems. She will probably need her Lasix and metolazone resume tomorrow once her CBGs have improved. Closely monitor her respiratory status. Most recent 2-D echo was done in February 2016. We'll ask for strict I's and O's, daily weights. Low-sodium  diet.  Chronic kidney disease stage III - creatinine slightly above her baseline, this is likely due to dehydration in the setting of hyperglycemia as well as ongoing use of diuretics. We'll repeat BMP in the morning.  Hypertension - resume her ARB tomorrow  HLD - resume statin  Diet: heart healthy Fluids: NS 50 cc/ for 6 h with D5 while CBG < 250. Stop as soon as possible.  DVT Prophylaxis: SCDs, awaiting ortho evaluation  Code Status: Full  Family Communication: d/w family bedside  Disposition Plan: admit to telemetry    Talissa Apple M. Elvera Lennox, MD [facility] Pager [phone]   If 7PM-7AM, please contact night-coverage [email or web address] Password [facility] 01/30/2015, 3:06 PM

## 2015-01-30 NOTE — ED Notes (Signed)
Ortho tech notified about Short leg splint.

## 2015-01-31 ENCOUNTER — Other Ambulatory Visit (HOSPITAL_COMMUNITY): Payer: Self-pay | Admitting: *Deleted

## 2015-01-31 ENCOUNTER — Inpatient Hospital Stay (HOSPITAL_COMMUNITY): Payer: Medicare Other | Admitting: Anesthesiology

## 2015-01-31 ENCOUNTER — Inpatient Hospital Stay (HOSPITAL_COMMUNITY): Payer: Medicare Other

## 2015-01-31 ENCOUNTER — Encounter (HOSPITAL_COMMUNITY)
Admission: EM | Disposition: A | Discharge status: Skilled Nursing Facility | Payer: Self-pay | Source: Home / Self Care | Attending: Internal Medicine

## 2015-01-31 DIAGNOSIS — S82841A Displaced bimalleolar fracture of right lower leg, initial encounter for closed fracture: Secondary | ICD-10-CM

## 2015-01-31 DIAGNOSIS — I480 Paroxysmal atrial fibrillation: Secondary | ICD-10-CM

## 2015-01-31 DIAGNOSIS — N183 Chronic kidney disease, stage 3 (moderate): Secondary | ICD-10-CM

## 2015-01-31 DIAGNOSIS — I10 Essential (primary) hypertension: Secondary | ICD-10-CM

## 2015-01-31 DIAGNOSIS — E785 Hyperlipidemia, unspecified: Secondary | ICD-10-CM

## 2015-01-31 DIAGNOSIS — Z954 Presence of other heart-valve replacement: Secondary | ICD-10-CM

## 2015-01-31 DIAGNOSIS — I340 Nonrheumatic mitral (valve) insufficiency: Secondary | ICD-10-CM

## 2015-01-31 DIAGNOSIS — I509 Heart failure, unspecified: Secondary | ICD-10-CM

## 2015-01-31 DIAGNOSIS — Z01810 Encounter for preprocedural cardiovascular examination: Secondary | ICD-10-CM

## 2015-01-31 HISTORY — PX: ORIF ANKLE FRACTURE: SHX5408

## 2015-01-31 LAB — COMPREHENSIVE METABOLIC PANEL
ALBUMIN: 3.1 g/dL — AB (ref 3.5–5.2)
ALT: 16 U/L (ref 0–35)
ANION GAP: 11 (ref 5–15)
AST: 23 U/L (ref 0–37)
Alkaline Phosphatase: 35 U/L — ABNORMAL LOW (ref 39–117)
BUN: 37 mg/dL — AB (ref 6–23)
CHLORIDE: 101 mmol/L (ref 96–112)
CO2: 24 mmol/L (ref 19–32)
Calcium: 9.4 mg/dL (ref 8.4–10.5)
Creatinine, Ser: 1.42 mg/dL — ABNORMAL HIGH (ref 0.50–1.10)
GFR calc Af Amer: 39 mL/min — ABNORMAL LOW (ref >90)
GFR calc non Af Amer: 33 mL/min — ABNORMAL LOW (ref >90)
GLUCOSE: 93 mg/dL (ref 70–99)
Potassium: 4 mmol/L (ref 3.5–5.1)
Sodium: 136 mmol/L (ref 135–145)
TOTAL PROTEIN: 5.1 g/dL — AB (ref 6.0–8.3)
Total Bilirubin: 0.6 mg/dL (ref 0.3–1.2)

## 2015-01-31 LAB — GLUCOSE, CAPILLARY
GLUCOSE-CAPILLARY: 149 mg/dL — AB (ref 70–99)
GLUCOSE-CAPILLARY: 146 mg/dL — AB (ref 70–99)
GLUCOSE-CAPILLARY: 95 mg/dL (ref 70–99)
GLUCOSE-CAPILLARY: 72 mg/dL (ref 70–99)
GLUCOSE-CAPILLARY: 72 mg/dL (ref 70–99)
GLUCOSE-CAPILLARY: 91 mg/dL (ref 70–99)
GLUCOSE-CAPILLARY: 88 mg/dL (ref 70–99)
GLUCOSE-CAPILLARY: 83 mg/dL (ref 70–99)
Glucose-Capillary: 113 mg/dL — ABNORMAL HIGH (ref 70–99)
Glucose-Capillary: 136 mg/dL — ABNORMAL HIGH (ref 70–99)
Glucose-Capillary: 170 mg/dL — ABNORMAL HIGH (ref 70–99)
Glucose-Capillary: 65 mg/dL — ABNORMAL LOW (ref 70–99)
Glucose-Capillary: 67 mg/dL — ABNORMAL LOW (ref 70–99)

## 2015-01-31 LAB — CBC
HCT: 27.9 % — ABNORMAL LOW (ref 36.0–46.0)
Hemoglobin: 9.7 g/dL — ABNORMAL LOW (ref 12.0–15.0)
MCH: 30.6 pg (ref 26.0–34.0)
MCHC: 34.8 g/dL (ref 30.0–36.0)
MCV: 88 fL (ref 78.0–100.0)
PLATELETS: 197 10*3/uL (ref 150–400)
RBC: 3.17 MIL/uL — AB (ref 3.87–5.11)
RDW: 13.2 % (ref 11.5–15.5)
WBC: 11.2 10*3/uL — ABNORMAL HIGH (ref 4.0–10.5)

## 2015-01-31 LAB — URINE CULTURE: Colony Count: 50000

## 2015-01-31 LAB — SURGICAL PCR SCREEN
MRSA, PCR: NEGATIVE
Staphylococcus aureus: NEGATIVE

## 2015-01-31 SURGERY — OPEN REDUCTION INTERNAL FIXATION (ORIF) ANKLE FRACTURE
Anesthesia: Monitor Anesthesia Care | Laterality: Right

## 2015-01-31 MED ORDER — PHENYLEPHRINE 40 MCG/ML (10ML) SYRINGE FOR IV PUSH (FOR BLOOD PRESSURE SUPPORT)
PREFILLED_SYRINGE | INTRAVENOUS | Status: AC
  Filled 2015-01-31: qty 10

## 2015-01-31 MED ORDER — METOCLOPRAMIDE HCL 5 MG/ML IJ SOLN
5.0000 mg | Freq: Three times a day (TID) | INTRAMUSCULAR | Status: DC | PRN
  Filled 2015-01-31: qty 2

## 2015-01-31 MED ORDER — PHENYLEPHRINE HCL 10 MG/ML IJ SOLN
INTRAMUSCULAR | Status: DC | PRN
  Administered 2015-01-31 (×4): 80 ug via INTRAVENOUS

## 2015-01-31 MED ORDER — GLUCERNA SHAKE PO LIQD: 237.0000 mL | Freq: Two times a day (BID) | ORAL | Status: DC | PRN

## 2015-01-31 MED ORDER — OXYCODONE HCL 5 MG/5ML PO SOLN: 5.0000 mg | Freq: Once | ORAL | Status: AC | PRN

## 2015-01-31 MED ORDER — MUPIROCIN 2 % EX OINT
1.0000 "application " | TOPICAL_OINTMENT | Freq: Once | CUTANEOUS | Status: AC
  Administered 2015-01-31: 1 via TOPICAL

## 2015-01-31 MED ORDER — FENTANYL CITRATE 0.05 MG/ML IJ SOLN
50.0000 ug | INTRAMUSCULAR | Status: DC | PRN
  Administered 2015-01-31: 100 ug via INTRAVENOUS
  Filled 2015-01-31: qty 2

## 2015-01-31 MED ORDER — DEXTROSE-NACL 5-0.9 % IV SOLN
INTRAVENOUS | Status: DC
  Administered 2015-01-31: 13:00:00 via INTRAVENOUS
  Filled 2015-01-31: qty 1000

## 2015-01-31 MED ORDER — FENTANYL CITRATE 0.05 MG/ML IJ SOLN: 25.0000 ug | INTRAMUSCULAR | Status: DC | PRN

## 2015-01-31 MED ORDER — ONDANSETRON HCL 4 MG PO TABS: 4.0000 mg | ORAL_TABLET | Freq: Four times a day (QID) | ORAL | Status: DC | PRN

## 2015-01-31 MED ORDER — ROCURONIUM BROMIDE 50 MG/5ML IV SOLN
INTRAVENOUS | Status: AC
  Filled 2015-01-31: qty 1

## 2015-01-31 MED ORDER — METOCLOPRAMIDE HCL 5 MG PO TABS
5.0000 mg | ORAL_TABLET | Freq: Three times a day (TID) | ORAL | Status: DC | PRN
Start: 2015-01-31 — End: 2015-02-02
  Filled 2015-01-31: qty 2

## 2015-01-31 MED ORDER — LIDOCAINE HCL (CARDIAC) 20 MG/ML IV SOLN
INTRAVENOUS | Status: AC
  Filled 2015-01-31: qty 5

## 2015-01-31 MED ORDER — ETOMIDATE 2 MG/ML IV SOLN
INTRAVENOUS | Status: AC
  Filled 2015-01-31: qty 10

## 2015-01-31 MED ORDER — PROPOFOL 10 MG/ML IV BOLUS
INTRAVENOUS | Status: AC
  Filled 2015-01-31: qty 20

## 2015-01-31 MED ORDER — MIDAZOLAM HCL 2 MG/2ML IJ SOLN
1.0000 mg | INTRAMUSCULAR | Status: DC | PRN
  Administered 2015-01-31: 2 mg via INTRAVENOUS
  Filled 2015-01-31: qty 2

## 2015-01-31 MED ORDER — INSULIN GLARGINE 100 UNIT/ML ~~LOC~~ SOLN
10.0000 [IU] | Freq: Every day | SUBCUTANEOUS | Status: DC
  Filled 2015-01-31 (×2): qty 0.1

## 2015-01-31 MED ORDER — FENTANYL CITRATE 0.05 MG/ML IJ SOLN
INTRAMUSCULAR | Status: AC
  Filled 2015-01-31: qty 5

## 2015-01-31 MED ORDER — DOCUSATE SODIUM 100 MG PO CAPS: 100.0000 mg | ORAL_CAPSULE | Freq: Two times a day (BID) | ORAL | Status: DC

## 2015-01-31 MED ORDER — LINAGLIPTIN 5 MG PO TABS
5.0000 mg | ORAL_TABLET | Freq: Every day | ORAL | Status: DC
Start: 2015-01-31 — End: 2015-02-02
  Administered 2015-01-31 – 2015-02-02 (×3): 5 mg via ORAL
  Filled 2015-01-31 (×4): qty 1

## 2015-01-31 MED ORDER — ACETAMINOPHEN 325 MG PO TABS
650.0000 mg | ORAL_TABLET | Freq: Four times a day (QID) | ORAL | Status: DC | PRN
  Administered 2015-02-01: 650 mg via ORAL
  Filled 2015-01-31: qty 2

## 2015-01-31 MED ORDER — MUPIROCIN 2 % EX OINT
TOPICAL_OINTMENT | CUTANEOUS | Status: AC
  Administered 2015-01-31: 1 via TOPICAL
  Filled 2015-01-31: qty 22

## 2015-01-31 MED ORDER — HYDROCODONE-ACETAMINOPHEN 5-325 MG PO TABS
1.0000 | ORAL_TABLET | ORAL | Status: DC | PRN
  Administered 2015-02-01 – 2015-02-02 (×3): 1 via ORAL
  Filled 2015-01-31 (×3): qty 1

## 2015-01-31 MED ORDER — PROPOFOL INFUSION 10 MG/ML OPTIME
INTRAVENOUS | Status: DC | PRN
  Administered 2015-01-31: 30 ug/kg/min via INTRAVENOUS

## 2015-01-31 MED ORDER — OXYCODONE HCL 5 MG PO TABS: 5.0000 mg | ORAL_TABLET | Freq: Once | ORAL | Status: AC | PRN

## 2015-01-31 MED ORDER — ONDANSETRON HCL 4 MG/2ML IJ SOLN
INTRAMUSCULAR | Status: DC | PRN
  Administered 2015-01-31: 4 mg via INTRAVENOUS

## 2015-01-31 MED ORDER — ROPIVACAINE HCL 5 MG/ML IJ SOLN
INTRAMUSCULAR | Status: DC | PRN
  Administered 2015-01-31: 30 mL via PERINEURAL

## 2015-01-31 MED ORDER — 0.9 % SODIUM CHLORIDE (POUR BTL) OPTIME
TOPICAL | Status: DC | PRN
  Administered 2015-01-31: 1000 mL

## 2015-01-31 MED ORDER — EPHEDRINE SULFATE 50 MG/ML IJ SOLN
INTRAMUSCULAR | Status: DC | PRN
  Administered 2015-01-31: 10 mg via INTRAVENOUS

## 2015-01-31 MED ORDER — ONDANSETRON HCL 4 MG/2ML IJ SOLN: 4.0000 mg | Freq: Once | INTRAMUSCULAR | Status: AC | PRN

## 2015-01-31 MED ORDER — ENOXAPARIN SODIUM 30 MG/0.3ML ~~LOC~~ SOLN
30.0000 mg | SUBCUTANEOUS | Status: DC
Start: 2015-02-01 — End: 2015-02-02
  Administered 2015-02-01 – 2015-02-02 (×2): 30 mg via SUBCUTANEOUS
  Filled 2015-01-31 (×3): qty 0.3

## 2015-01-31 MED ORDER — ACETAMINOPHEN 650 MG RE SUPP: 650.0000 mg | Freq: Four times a day (QID) | RECTAL | Status: DC | PRN

## 2015-01-31 MED ORDER — DEXTROSE 50 % IV SOLN
25.0000 mL | Freq: Once | INTRAVENOUS | Status: AC
  Administered 2015-01-31: 25 mL via INTRAVENOUS

## 2015-01-31 MED ORDER — LACTATED RINGERS IV SOLN
INTRAVENOUS | Status: DC
  Administered 2015-01-31 (×2): via INTRAVENOUS

## 2015-01-31 MED ORDER — MEPERIDINE HCL 25 MG/ML IJ SOLN: 6.2500 mg | INTRAMUSCULAR | Status: DC | PRN

## 2015-01-31 MED ORDER — ONDANSETRON HCL 4 MG/2ML IJ SOLN
INTRAMUSCULAR | Status: AC
  Filled 2015-01-31: qty 2

## 2015-01-31 MED ORDER — CEFAZOLIN SODIUM-DEXTROSE 2-3 GM-% IV SOLR
INTRAVENOUS | Status: DC | PRN
  Administered 2015-01-31: 2 g via INTRAVENOUS

## 2015-01-31 MED ORDER — FUROSEMIDE 10 MG/ML IJ SOLN
40.0000 mg | Freq: Once | INTRAMUSCULAR | Status: AC
  Administered 2015-01-31: 40 mg via INTRAVENOUS
  Filled 2015-01-31: qty 4

## 2015-01-31 MED ORDER — ONDANSETRON HCL 4 MG/2ML IJ SOLN: 4.0000 mg | Freq: Four times a day (QID) | INTRAMUSCULAR | Status: DC | PRN

## 2015-01-31 MED ORDER — MORPHINE SULFATE 2 MG/ML IJ SOLN: 2.0000 mg | INTRAMUSCULAR | Status: DC | PRN

## 2015-01-31 MED ORDER — INSULIN ASPART 100 UNIT/ML ~~LOC~~ SOLN
0.0000 [IU] | Freq: Three times a day (TID) | SUBCUTANEOUS | Status: DC
  Administered 2015-02-02: 5 [IU] via SUBCUTANEOUS

## 2015-01-31 SURGICAL SUPPLY — 59 items
BANDAGE ELASTIC 4 VELCRO ST LF (GAUZE/BANDAGES/DRESSINGS) ×3 IMPLANT
BANDAGE ELASTIC 6 VELCRO ST LF (GAUZE/BANDAGES/DRESSINGS) ×3 IMPLANT
BANDAGE ESMARK 6X9 LF (GAUZE/BANDAGES/DRESSINGS) IMPLANT
BIT DRILL 2.5X2.75 QC CALB (BIT) ×3 IMPLANT
BNDG ESMARK 6X9 LF (GAUZE/BANDAGES/DRESSINGS)
COVER MAYO STAND STRL (DRAPES) IMPLANT
COVER SURGICAL LIGHT HANDLE (MISCELLANEOUS) ×3 IMPLANT
CUFF TOURNIQUET SINGLE 34IN LL (TOURNIQUET CUFF) IMPLANT
CUFF TOURNIQUET SINGLE 44IN (TOURNIQUET CUFF) IMPLANT
DRAPE C-ARM 42X72 X-RAY (DRAPES) IMPLANT
DRAPE INCISE IOBAN 66X45 STRL (DRAPES) ×3 IMPLANT
DRAPE PROXIMA HALF (DRAPES) ×3 IMPLANT
DRAPE U-SHAPE 47X51 STRL (DRAPES) ×3 IMPLANT
DRESSING ALLEVYN LIFE SACRUM (GAUZE/BANDAGES/DRESSINGS) ×3 IMPLANT
DRSG PAD ABDOMINAL 8X10 ST (GAUZE/BANDAGES/DRESSINGS) ×3 IMPLANT
DURAPREP 26ML APPLICATOR (WOUND CARE) ×3 IMPLANT
ELECT REM PT RETURN 9FT ADLT (ELECTROSURGICAL) ×3
ELECTRODE REM PT RTRN 9FT ADLT (ELECTROSURGICAL) ×1 IMPLANT
GAUZE SPONGE 4X4 12PLY STRL (GAUZE/BANDAGES/DRESSINGS) ×3 IMPLANT
GAUZE XEROFORM 5X9 LF (GAUZE/BANDAGES/DRESSINGS) ×3 IMPLANT
GLOVE BIOGEL PI IND STRL 8 (GLOVE) ×2 IMPLANT
GLOVE BIOGEL PI INDICATOR 8 (GLOVE) ×4
GLOVE ORTHO TXT STRL SZ7.5 (GLOVE) ×6 IMPLANT
GOWN STRL REUS W/ TWL LRG LVL3 (GOWN DISPOSABLE) ×1 IMPLANT
GOWN STRL REUS W/ TWL XL LVL3 (GOWN DISPOSABLE) ×1 IMPLANT
GOWN STRL REUS W/TWL 2XL LVL3 (GOWN DISPOSABLE) ×3 IMPLANT
GOWN STRL REUS W/TWL LRG LVL3 (GOWN DISPOSABLE) ×2
GOWN STRL REUS W/TWL XL LVL3 (GOWN DISPOSABLE) ×2
K-WIRE ACE 1.6X6 (WIRE) ×6
KIT BASIN OR (CUSTOM PROCEDURE TRAY) ×3 IMPLANT
KIT ROOM TURNOVER OR (KITS) ×3 IMPLANT
KWIRE ACE 1.6X6 (WIRE) ×2 IMPLANT
MANIFOLD NEPTUNE II (INSTRUMENTS) ×3 IMPLANT
NS IRRIG 1000ML POUR BTL (IV SOLUTION) ×3 IMPLANT
PACK ORTHO EXTREMITY (CUSTOM PROCEDURE TRAY) ×3 IMPLANT
PAD ARMBOARD 7.5X6 YLW CONV (MISCELLANEOUS) ×6 IMPLANT
PAD CAST 4YDX4 CTTN HI CHSV (CAST SUPPLIES) ×1 IMPLANT
PADDING CAST ABS 4INX4YD NS (CAST SUPPLIES) ×2
PADDING CAST ABS COTTON 4X4 ST (CAST SUPPLIES) ×1 IMPLANT
PADDING CAST COTTON 4X4 STRL (CAST SUPPLIES) ×2
PADDING CAST COTTON 6X4 STRL (CAST SUPPLIES) ×3 IMPLANT
PLATE LOCK 7H 92 BILAT FIB (Plate) ×3 IMPLANT
SCREW ACE CAN 4.0 40M (Screw) ×6 IMPLANT
SCREW LOCK CORT STAR 3.5X10 (Screw) ×9 IMPLANT
SCREW LOW PROFILE 12MMX3.5MM (Screw) ×3 IMPLANT
SCREW NON LOCKING LP 3.5 14MM (Screw) ×9 IMPLANT
SPLINT FIBERGLASS 4X30 (CAST SUPPLIES) ×3 IMPLANT
SPONGE LAP 18X18 X RAY DECT (DISPOSABLE) ×3 IMPLANT
STAPLER VISISTAT 35W (STAPLE) IMPLANT
SUCTION FRAZIER TIP 10 FR DISP (SUCTIONS) ×3 IMPLANT
SUT ETHILON 3 0 PS 1 (SUTURE) ×6 IMPLANT
SUT VIC AB 2-0 CT1 27 (SUTURE) ×4
SUT VIC AB 2-0 CT1 TAPERPNT 27 (SUTURE) ×2 IMPLANT
TOWEL OR 17X24 6PK STRL BLUE (TOWEL DISPOSABLE) ×3 IMPLANT
TOWEL OR 17X26 10 PK STRL BLUE (TOWEL DISPOSABLE) ×3 IMPLANT
TUBE CONNECTING 12'X1/4 (SUCTIONS) ×1
TUBE CONNECTING 12X1/4 (SUCTIONS) ×2 IMPLANT
WATER STERILE IRR 1000ML POUR (IV SOLUTION) ×3 IMPLANT
YANKAUER SUCT BULB TIP NO VENT (SUCTIONS) ×3 IMPLANT

## 2015-01-31 NOTE — Progress Notes (Signed)
Echocardiogram 2D Echocardiogram has been performed.  Dorothey Baseman 01/31/2015, 4:44 PM

## 2015-01-31 NOTE — Progress Notes (Signed)
Pt became hypotensive during downtime. 1L NS bolus given. BP up to 91/77. Will continue to monitor.

## 2015-01-31 NOTE — Op Note (Signed)
Preop diagnosis: Right bimalleolar ankle fracture  Postop diagnosis: Same  Procedure: Open reduction internal fixation medial and lateral malleolus. Biomet small fragment set  Surgeon: Annell Greening M.D.  Anesthesia: Popliteal regional block plus IV sedation  Tourniquet: Esmarch tourniquet for less than 30 minutes  Procedure: After induction of regional anesthesia with popliteal block prepping and draping with DuraPrep timeout procedure extremity sheets and drapes sterile skin marker Esmarch tourniquet with Ancef prophylaxis incision was made medially first exposing the medial malleolar fragment with the C-shaped incision irrigation reduction K wire fixation and then 40 mm cannulated lag screws for fixation of the medial malleolus C-arm was brought in with good position and alignment. Lateral incision was made fibula was reduced 7 hole plate was selected and combination of cortical locking and nonlocking screws were used for fixation filling all halls with anatomic alignment. Syndesmosis was then tested it was solid not unstable and no syndesmotic screw as needed. Irrigation removal of the tourniquet and then standard layer closure with 2-0 Vicryl followed by skin staple closure Xeroform 4 x 4's labral short leg splint and transferred to the recovery room in stable condition. Popliteal block worked well throughout the case. Signed Annell Greening M.D.

## 2015-01-31 NOTE — Progress Notes (Deleted)
Pt not fully cooperative with hospital stay/ plan of care. Pt very agitated at times with certain staff. Will continue to monitor.

## 2015-01-31 NOTE — Interval H&P Note (Signed)
History and Physical Interval Note:  01/31/2015 5:25 PM  Joanna [name]  has presented today for surgery, with the diagnosis of right ankle fracture  The various methods of treatment have been discussed with the patient and family. After consideration of risks, benefits and other options for treatment, the patient has consented to  Procedure(s): OPEN REDUCTION INTERNAL FIXATION (ORIF) ANKLE FRACTURE (Right) as a surgical intervention .  The patient's history has been reviewed, patient examined, no change in status, stable for surgery.  I have reviewed the patient's chart and labs.  Questions were answered to the patient's satisfaction.     Carleta Woodrow C

## 2015-01-31 NOTE — Anesthesia Postprocedure Evaluation (Signed)
  Anesthesia Post-op Note  Patient: Joanna [name]  Procedure(s) Performed: Procedure(s) (LRB): OPEN REDUCTION INTERNAL FIXATION (ORIF) ANKLE FRACTURE (Right)  Patient Location: PACU  Anesthesia Type: MAC combined with regional for post-op pain  Level of Consciousness: awake and alert   Airway and Oxygen Therapy: Patient Spontanous Breathing  Post-op Pain: mild  Post-op Assessment: Post-op Vital signs reviewed, Patient's Cardiovascular Status Stable, Respiratory Function Stable, Patent Airway and No signs of Nausea or vomiting  Last Vitals:  Filed Vitals:   01/31/15 1725  BP: 94/54  Pulse: 96  Temp:   Resp: 13    Post-op Vital Signs: stable   Complications: No apparent anesthesia complications

## 2015-01-31 NOTE — Progress Notes (Signed)
INITIAL NUTRITION ASSESSMENT  DOCUMENTATION CODES Per approved criteria  -Non-severe (moderate) malnutrition in the context of chronic illness  Pt meets criteria for MODERATE MALNUTRITION in the context of CHRONIC ILLNESS as evidenced by 8% weight loss in less than 2 months, moderate muscle wasting, and moderate fat wasting.   INTERVENTION: Diet advancement per MD Provide Glucerna Shake po q 24 hour and PRN, each supplement provides 220 kcal and 10 grams of protein Encourage PO intake   NUTRITION DIAGNOSIS: Inadequate oral intake related to recent frequent hospitalizations as evidenced by 8% weight loss in 5 weeks.   Goal: Pt to meet >/= 90% of their estimated nutrition needs   Monitor:  Diet advancement, PO intake, weight trend, labs, I/O's  Reason for Assessment: Malnutrition Screening Tool, score of 2  81 y.o. female  Admitting Dx: Fractured R ankle  ASSESSMENT: 81 y.o. female has a past medical history significant for chronic systolic and diastolic heart failure, ejection fraction 20-25%, followed by Dr. Elray Mcgregor in heart failure clinic, aortic and mitral valve disease status post repair with bioprosthetic valves 2 years ago, type 2 diabetes mellitus, hypertension, hyperlipidemia, presents to the hospital with chief complaint of fall. An ankle x-ray showed comminuted fractures of the medial malleolus and distal fibular diaphysis.   Pt reports losing weight due to being hospitalized 3 times recently; her appetite has been good and she likes the food but, she has periods of not being able to eat (NPO). Pt NPO today due to scheduled surgery. Weight history shows pt has lost from 129 lbs to 120 lbs in the past 5 weeks- 8% weight loss. Family in room reports that pt doesn't eat well when she is by herself at home which is fairly often; she used to drink Boost supplements but, it started interfering with her blood thinning medication. Pt states she just wants to get better; agreeable  to receiving Glucerna Shakes for nutrition supplement. RD encouraged adequate healthful PO intake with protein-rich foods at each meal.   Labs: low hemoglobin, decreased GFR  Nutrition Focused Physical Exam:  Subcutaneous Fat:  Orbital Region: wnl Upper Arm Region: moderate wasting Thoracic and Lumbar Region: wnl  Muscle:  Temple Region: mild wasting Clavicle Bone Region: moderate wasting Clavicle and Acromion Bone Region: moderate wasting Scapular Bone Region: NA Dorsal Hand: moderate wasting Patellar Region: moderate wasting Anterior Thigh Region: moderate wasting Posterior Calf Region: moderate wasting  Edema: none   Height: Ht Readings from Last 1 Encounters:  01/30/15  (1.575 m)    Weight: Wt Readings from Last 1 Encounters:  01/31/15 120 lb 9.5 oz (54.7 kg)    Ideal Body Weight: 110 lbs  % Ideal Body Weight: 109%  Wt Readings from Last 10 Encounters:  01/31/15 120 lb 9.5 oz (54.7 kg)  12/25/14 129 lb 4 oz (58.627 kg)  12/25/14 129 lb (58.514 kg)  12/21/14 134 lb 12.8 oz (61.145 kg)  12/15/14 128 lb 4.9 oz (58.2 kg)  06/12/14 127 lb (57.607 kg)  03/21/14 127 lb (57.607 kg)  02/28/14 126 lb (57.153 kg)  12/20/13 125 lb (56.7 kg)  11/28/13 122 lb 9.6 oz (55.611 kg)    Usual Body Weight: 130 lbs  % Usual Body Weight: 92%  BMI:  Body mass index is 22.05 kg/(m^2).  Estimated Nutritional Needs: Kcal: 1350-1550 Protein: 70-80 grams Fluid: 1.3-1.5 L/day  Skin: intact  Diet Order: Diet NPO time specified Except for: Sips with Meds  EDUCATION NEEDS: -No education needs identified at this time  Intake/Output Summary (Last 24 hours) at 01/31/15 0935 Last data filed at 01/31/15 0831  Gross per 24 hour  Intake   2520 ml  Output   1350 ml  Net   1170 ml    Last BM: 4/12   Labs:   Recent Labs Lab 01/30/15 1344 01/30/15 2209 01/31/15 0338  NA 128* 137 136  K 4.1 3.2* 4.0  CL 89* 101 101  CO2 25 29 24   BUN 45* 35* 37*  CREATININE  1.70* 1.45* 1.42*  CALCIUM 10.5 9.7 9.4  GLUCOSE 741* 93 93    CBG (last 3)   Recent Labs  01/31/15 0156 01/31/15 0610 01/31/15 0810  GLUCAP 146* 65* 95    Scheduled Meds: . aspirin EC  81 mg Oral Daily  .  ceFAZolin (ANCEF) IV  2 g Intravenous On Call to OR  . docusate sodium  100 mg Oral BID  . ferrous sulfate  325 mg Oral BID WC  . insulin aspart  0-9 Units Subcutaneous TID WC  . insulin glargine  10 Units Subcutaneous QHS  . losartan  50 mg Oral Daily  . simvastatin  20 mg Oral QPM  . sodium chloride  3 mL Intravenous Q12H    Continuous Infusions:   Past Medical History  Diagnosis Date  . Peripheral artery disease   . Hyperlipidemia     takes Simvastatin daily  . PONV (postoperative nausea and vomiting)   . Shortness of breath   . Arthritis   . Aortic stenosis 09/21/2011  . AORTIC VALVE SCLEROSIS 02/21/2010  . 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 BUNDLE BRANCH BLOCK, LEFT 07/30/2007  . CAROTID ARTERY STENOSIS 01/05/2009  . CVA 03/14/2010  . 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 PERIPHERAL VASCULAR DISEASE 07/30/2007  . POLYPECTOMY, HX OF 07/30/2007  . Constipation   . Chronic combined systolic and diastolic CHF (congestive heart failure)     a. RHC 09/2011 RA 8, RV 58/2/11; PA 54/22 (37) PCWP 20; F CO/CI 4.57/2.67 PVR 3.7; b. L main no sig dz, LAD luminal irreg; LCx lg Ramus with luminal irreg, small AV Lcx witout sig dz, RCA luminal irreg; severe mitral annular calcifications; AV calcified c. EF 45-50% (07/2013);  d. 11/2014 Echo: EF 20-25%, diff HK, nl AV/MV, sev dil LA, mod TR/PR, PASP .  . Diabetes mellitus   . DM 07/30/2007  . HTN (hypertension)   . HYPERTENSION 07/30/2007  . Weakness   . Dizziness   . Joint pain   . Itching   . Anemia   . Iron deficiency anemia 09/21/2011  . Nocturia   . History of blood transfusion   . History of shingles   . Mitral regurgitation   . S/P aortic valve replacement with bioprosthetic valve 06/15/2013    21 mm [facility] Ease bovine pericardial tissue valve  . S/P mitral valve replacement with bioprosthetic valve 06/15/2013    25 mm [facility] Mitral bovine pericardial tissue valve  . Fracture, fibula, shaft 01/30/2015    right    Past Surgical History  Procedure Laterality Date  . Carotid endarterectomy Left 2011  . Polypectomy  12/27/04  . Cataract extraction Bilateral 2009  . Tee without cardioversion N/A 05/19/2013    Procedure: TRANSESOPHAGEAL ECHOCARDIOGRAM (TEE);  Surgeon: Laurey Morale, MD;  Location: [facility] ENDOSCOPY;  Service: Cardiovascular;  Laterality: N/A;  . Colonoscopy    . Cardiac catheterization  2012  . Aortic valve replacement N/A 06/15/2013    Procedure: AORTIC VALVE REPLACEMENT (AVR);  Surgeon:  Purcell Nails, MD;  Location: [facility] OR;  Service: Open Heart Surgery;  Laterality: N/A;  . Intraoperative transesophageal echocardiogram N/A 06/15/2013    Procedure: INTRAOPERATIVE TRANSESOPHAGEAL ECHOCARDIOGRAM;  Surgeon: Purcell Nails, MD;  Location: [facility] OR;  Service: Open Heart Surgery;  Laterality: N/A;  . Mitral valve replacement N/A 06/15/2013    Procedure: MITRAL VALVE (MV) REPLACEMENT;  Surgeon: Purcell Nails, MD;  Location: [facility] OR;  Service: Open Heart Surgery;  Laterality: N/A;  . Epicardial pacing lead placement N/A 06/15/2013    Procedure: EPICARDIAL PACING LEAD PLACEMENT;  Surgeon: Purcell Nails, MD;  Location: [facility] OR;  Service: Open Heart Surgery;  Laterality: N/A;  . Left and right heart catheterization with coronary angiogram N/A 09/19/2011    Procedure: LEFT AND RIGHT HEART CATHETERIZATION WITH CORONARY ANGIOGRAM;  Surgeon: Dolores Patty, MD;  Location: [facility] CATH LAB;  Service: Cardiovascular;  Laterality: N/A;    Ian Malkin RD, LDN Inpatient Clinical Dietitian Pager: [phone] After Hours Pager: [phone]

## 2015-01-31 NOTE — Progress Notes (Signed)
Prior to transporting pt to OR for procedure, telemetry box was removed and CCMD notified.  Pt in SR, VSS, and pt in no acute distress.

## 2015-01-31 NOTE — Progress Notes (Signed)
CRNA at bedside.

## 2015-01-31 NOTE — Care Management Note (Signed)
    Page 1 of 1   02/02/2015     3:11:30 PM CARE MANAGEMENT NOTE 02/02/2015  Patient:  Joanna [name], Joanna [name]   Account Number:  [id number]  Date Initiated:  01/31/2015  Documentation initiated by:  Aylani Spurlock  Subjective/Objective Assessment:   Pt adm on 01/30/15 with rt ankle fracture with fall, hyperglycemia.  PTA, pt resides at home alone.     Action/Plan:   Ankle surgery today; will likely need SNF at dc.  Will need PT consult post op.   Anticipated DC Date:  02/02/2015   Anticipated DC Plan:  SKILLED NURSING FACILITY  In-house referral  Clinical Social Worker      DC Planning Services  CM consult      Choice offered to / List presented to:             Status of service:  Completed, signed off Medicare Important Message given?  YES (If response is "NO", the following Medicare IM given date fields will be blank) Date Medicare IM given:  02/02/2015 Medicare IM given by:  Alizeh Madril Date Additional Medicare IM given:   Additional Medicare IM given by:    Discharge Disposition:  SKILLED NURSING FACILITY  Per UR Regulation:  Reviewed for med. necessity/level of care/duration of stay  If discussed at Long Length of Stay Meetings, dates discussed:    Comments:  02/02/15 Sidney Ace, RN, BSN [phone] Pt discharged to SNF today, per CSW arrangements.

## 2015-01-31 NOTE — Anesthesia Procedure Notes (Addendum)
Anesthesia Regional Block:  Popliteal block  Pre-Anesthetic Checklist: ,, timeout performed, Correct Patient, Correct Site, Correct Laterality, Correct Procedure, Correct Position, site marked, Risks and benefits discussed,  Surgical consent,  Pre-op evaluation,  At surgeon's request and post-op pain management  Laterality: Right and Lower  Prep: chloraprep       Needles:  Injection technique: Single-shot  Needle Type: Echogenic Stimulator Needle      Needle Gauge: 22 and 22 G    Additional Needles:  Procedures: ultrasound guided (picture in chart) and nerve stimulator Popliteal block Narrative:  End time: 01/31/2015 5:36 PM Injection made incrementally with aspirations every 5 mL.  Performed by: Personally  Anesthesiologist: Milly Jakob  Additional Notes: 1 mg of versed and 100 mcg of fentanyl given for the procedure.

## 2015-01-31 NOTE — Consult Note (Signed)
Advanced Heart Failure Team Consult Note  Referring Physician: Dr Lafe Garin Primary Physician: Dr Yetta Barre  Primary Cardiologist:  Dr Gala Romney  Reason for Consultation: Heart Failure   HPI:    [name] is a 81 year old woman with aortic stenosis s/p AVR/mitral valve replacement (06/15/2013), systolic HF due to NICM, hypertension, diabetes mellitus type II, hyperlipidemia, and peripheral artery disease. Underwent AVR/MVR with placement of epicardial lead on 06/15/13 with PAF post-op terminated with amiodarone- this was later stopped due to nausea.   She has been followed in the HF clinic and was last seen December 25, 2014. At that time she was to remain off BB due to soft BP and continue on losartan. Weight at that time was 129 pounds.   Admitted after fall with fractured R ankle. Prior to admit she was to start insulin however she did not want perform insulin injections. Also has missed some medications. Had 4 falls recently. During that time she had increased thirst and urination. In the ED she hyperglycemic with glucose 741 and received IV fluids and started on insulin drip. Cardiac markers negative. Evaluated by Dr Ophelia Charter with plans for surgery later today.   Complaining of R ankle pain. Denies SOB. Says HF has been well controlled lately.   ECHO 12/15/2014: EF 20-25% Bioprosthetic aortic valve.   Review of Systems: [y] = yes,  = no   General: Weight gain ; Weight loss [Y ]; Anorexia ; Fatigue [ Y]; Fever ; Chills ; Weakness [Y ]  Cardiac: Chest pain/pressure ; Resting SOB ; Exertional SOB ; Orthopnea ; Pedal Edema ; Palpitations ; Syncope ; Presyncope ; Paroxysmal nocturnal dyspnea[ ]   Pulmonary: Cough ; Wheezing[ ] ; Hemoptysis[ ] ; Sputum ; Snoring   GI: Vomiting[ ] ; Dysphagia[ ] ; Melena[ ] ; Hematochezia ; Heartburn[ ] ; Abdominal pain ; Constipation ; Diarrhea ; BRBPR   GU: Hematuria[ ] ; Dysuria ; Nocturia[ ]   Vascular: Pain in  legs with walking ; Pain in feet with lying flat ; Non-healing sores ; Stroke ; TIA ; Slurred speech ;  Neuro: Headaches[ ] ; Vertigo[ ] ; Seizures[ ] ; Paresthesias[ ] ;Blurred vision ; Diplopia ; Vision changes   Ortho/Skin: Arthritis ; Joint pain [Y ]; Muscle pain ; Joint swelling ; Back Pain ; Rash   Psych: Depression[ ] ; Anxiety[ ]   Heme: Bleeding problems ; Clotting disorders ; Anemia   Endocrine: Diabetes [ Y]; Thyroid dysfunction[ ]   Home Medications Prior to Admission medications   Medication Sig Start Date End Date Taking? Authorizing Provider  acetaminophen (TYLENOL) 325 MG tablet Take 325 mg by mouth every 6 (six) hours as needed for mild pain.   Yes Historical Provider, MD  aspirin EC 81 MG tablet Take 1 tablet (81 mg total) by mouth daily. 07/10/14  Yes Dolores Patty, MD  calcium citrate-vitamin D (CITRACAL+D) 315-200 MG-UNIT per tablet Take 1 tablet by mouth daily.   Yes Historical Provider, MD  docusate sodium (COLACE) 100 MG capsule Take 100 mg by mouth 2 (two) times daily.     Yes Historical Provider, MD  ferrous sulfate 325 (65 FE) MG tablet Take 1 tablet (325 mg total) by mouth 2 (two) times daily with a meal. 02/28/14  Yes Etta Grandchild, MD  furosemide (LASIX) 40 MG tablet Take 1 tablet (  40 mg total) by mouth 2 (two) times daily. 12/15/14  Yes Ok Anis, NP  Insulin Pen Needle (NOVOFINE) 32G X 6 MM MISC Use daily with insulin 12/29/14  Yes Etta Grandchild, MD  JANUVIA 50 MG tablet Take 50 mg by mouth daily. 10/28/14  Yes Historical Provider, MD  losartan (COZAAR) 50 MG tablet Take 1 tablet (50 mg total) by mouth daily. Please cancel all previous orders for current medication. Change in dosage or pill size. 12/21/14  Yes Dolores Patty, MD  metolazone (ZAROXOLYN) 2.5 MG tablet Take 1 tablet (2.5 mg total) by mouth once a week. On Tuesday 30 min prior to morning Lasix, 12/25/14  Yes Laurey Morale, MD  potassium chloride  SA (K-DUR,KLOR-CON) 20 MEQ tablet Take 1 tablet (20 mEq total) by mouth daily. 12/21/14  Yes Dolores Patty, MD  simvastatin (ZOCOR) 20 MG tablet Take 1 tablet (20 mg total) by mouth every evening. 02/28/14  Yes Etta Grandchild, MD  Insulin Glargine (TOUJEO SOLOSTAR) 300 UNIT/ML SOPN Inject 30 Units into the skin daily. Patient taking differently: Inject 30 Units into the skin daily. Pt hasnt started yet 12/29/14   Etta Grandchild, MD    Past Medical History: Past Medical History  Diagnosis Date  . Peripheral artery disease   . Hyperlipidemia     takes Simvastatin daily  . PONV (postoperative nausea and vomiting)   . Shortness of breath   . Arthritis   . Aortic stenosis 09/21/2011  . AORTIC VALVE SCLEROSIS 02/21/2010  . 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 BUNDLE BRANCH BLOCK, LEFT 07/30/2007  . CAROTID ARTERY STENOSIS 01/05/2009  . CVA 03/14/2010  . 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 PERIPHERAL VASCULAR DISEASE 07/30/2007  . POLYPECTOMY, HX OF 07/30/2007  . Constipation   . Chronic combined systolic and diastolic CHF (congestive heart failure)     a. RHC 09/2011 RA 8, RV 58/2/11; PA 54/22 (37) PCWP 20; F CO/CI 4.57/2.67 PVR 3.7; b. L main no sig dz, LAD luminal irreg; LCx lg Ramus with luminal irreg, small AV Lcx witout sig dz, RCA luminal irreg; severe mitral annular calcifications; AV calcified c. EF 45-50% (07/2013);  d. 11/2014 Echo: EF 20-25%, diff HK, nl AV/MV, sev dil LA, mod TR/PR, PASP .  . Diabetes mellitus   . DM 07/30/2007  . HTN (hypertension)   . HYPERTENSION 07/30/2007  . Weakness   . Dizziness   . Joint pain   . Itching   . Anemia   . Iron deficiency anemia 09/21/2011  . Nocturia   . History of blood transfusion   . History of shingles   . Mitral regurgitation   . S/P aortic valve replacement with bioprosthetic valve 06/15/2013    21 mm [facility] Ease bovine pericardial tissue valve  . S/P mitral valve replacement with bioprosthetic valve 06/15/2013    25 mm  [facility] Mitral bovine pericardial tissue valve  . Fracture, fibula, shaft 01/30/2015    right    Past Surgical History: Past Surgical History  Procedure Laterality Date  . Carotid endarterectomy Left 2011  . Polypectomy  12/27/04  . Cataract extraction Bilateral 2009  . Tee without cardioversion N/A 05/19/2013    Procedure: TRANSESOPHAGEAL ECHOCARDIOGRAM (TEE);  Surgeon: Laurey Morale, MD;  Location: [facility] ENDOSCOPY;  Service: Cardiovascular;  Laterality: N/A;  . Colonoscopy    . Cardiac catheterization  2012  . Aortic valve replacement N/A 06/15/2013    Procedure: AORTIC VALVE REPLACEMENT (AVR);  Surgeon: Salvatore Decent  Cornelius Moras, MD;  Location: [facility] OR;  Service: Open Heart Surgery;  Laterality: N/A;  . Intraoperative transesophageal echocardiogram N/A 06/15/2013    Procedure: INTRAOPERATIVE TRANSESOPHAGEAL ECHOCARDIOGRAM;  Surgeon: Purcell Nails, MD;  Location: [facility] OR;  Service: Open Heart Surgery;  Laterality: N/A;  . Mitral valve replacement N/A 06/15/2013    Procedure: MITRAL VALVE (MV) REPLACEMENT;  Surgeon: Purcell Nails, MD;  Location: [facility] OR;  Service: Open Heart Surgery;  Laterality: N/A;  . Epicardial pacing lead placement N/A 06/15/2013    Procedure: EPICARDIAL PACING LEAD PLACEMENT;  Surgeon: Purcell Nails, MD;  Location: [facility] OR;  Service: Open Heart Surgery;  Laterality: N/A;  . Left and right heart catheterization with coronary angiogram N/A 09/19/2011    Procedure: LEFT AND RIGHT HEART CATHETERIZATION WITH CORONARY ANGIOGRAM;  Surgeon: Dolores Patty, MD;  Location: [facility] CATH LAB;  Service: Cardiovascular;  Laterality: N/A;    Family History: Family History  Problem Relation Age of Onset  . Colon cancer Mother   . Diabetes Neg Hx   . Coronary artery disease Neg Hx     Social History: History   Social History  . Marital Status: Divorced    Spouse Name: N/A  . Number of Children: N/A  . Years of Education: N/A   Social History Main Topics  . Smoking status: Never  Smoker   . Smokeless tobacco: Never Used  . Alcohol Use: No  . Drug Use: No  . Sexual Activity: No   Other Topics Concern  . None   Social History Narrative   Patient lives alone here in town   She continues to work, helping to bind and oversew books   She is divorced after 23 years of marriage   1 son- '61, minister; 1 dtr '55; 4 grandchildren   End of life care-provided packet on living well    Allergies:  Allergies  Allergen Reactions  . Other Hives    STEROIDS  . Prednisone Hives and Other (See Comments)    She is allergic to all steroids!    Objective:    Vital Signs:   Temp:  [97.9 F (36.6 C)-99.9 F (37.7 C)] 98.7 F (37.1 C) (04/13 0510) Pulse Rate:  [72-93] 85 (04/13 0510) Resp:  [11-20] 20 (04/13 0510) BP: (87-123)/(46-77) 96/55 mmHg (04/13 0510) SpO2:  [92 %-100 %] 100 % (04/13 0510) Weight:  [119 lb 11.4 oz (54.3 kg)-124 lb (56.246 kg)] 120 lb 9.5 oz (54.7 kg) (04/13 0510) Last BM Date: 01/30/15  Weight change: Filed Weights   01/30/15 1534 01/30/15 1829 01/31/15 0510  Weight: 124 lb (56.246 kg) 119 lb 11.4 oz (54.3 kg) 120 lb 9.5 oz (54.7 kg)    Intake/Output:   Intake/Output Summary (Last 24 hours) at 01/31/15 0816 Last data filed at 01/31/15 0600  Gross per 24 hour  Intake   2520 ml  Output   1350 ml  Net   1170 ml     Physical Exam: General:  Elderly frail appearing. No resp difficulty HEENT: normal Neck: supple. JVP jaw prominent CV waves Carotids 2+ bilat; no bruits. No lymphadenopathy or thryomegaly appreciated. Cor: PMI nondisplaced. Regular rate & rhythm. 2/6 SEM RSR. + s3 Lungs: clear Abdomen: soft, nontender, nondistended. No hepatosplenomegaly. No bruits or masses. Good bowel sounds. Extremities: no cyanosis, clubbing, rash, edema RLE dressing in place Neuro: alert & orientedx3, cranial nerves grossly intact. moves all 4 extremities w/o difficulty. Affect pleasant  Telemetry: SR  Labs: Basic Metabolic Panel:  Recent  Labs Lab 01/30/15 1344 01/30/15 2209 01/31/15 0338  NA 128* 137 136  K 4.1 3.2* 4.0  CL 89* 101 101  CO2 25 29 24   GLUCOSE 741* 93 93  BUN 45* 35* 37*  CREATININE 1.70* 1.45* 1.42*  CALCIUM 10.5 9.7 9.4    Liver Function Tests:  Recent Labs Lab 01/30/15 1344 01/31/15 0338  AST 21 23  ALT 20 16  ALKPHOS 46 35*  BILITOT 1.0 0.6  PROT 6.2 5.1*  ALBUMIN 3.8 3.1*   No results for input(s): LIPASE, AMYLASE in the last 168 hours. No results for input(s): AMMONIA in the last 168 hours.  CBC:  Recent Labs Lab 01/30/15 1344 01/31/15 0338  WBC 9.0 11.2*  NEUTROABS 7.6  --   HGB 10.7* 9.7*  HCT 31.1* 27.9*  MCV 86.6 88.0  PLT 195 197    Cardiac Enzymes: No results for input(s): CKTOTAL, CKMB, CKMBINDEX, TROPONINI in the last 168 hours.  BNP: BNP (last 3 results)  Recent Labs  12/13/14 1240  BNP 3083.2*    ProBNP (last 3 results) No results for input(s): PROBNP in the last 8760 hours.   CBG:  Recent Labs Lab 01/31/15 01/31/15 0106 01/31/15 0156 01/31/15 0610 01/31/15 0810  GLUCAP 170* 149* 146* 65* 95    Coagulation Studies: No results for input(s): LABPROT, INR in the last 72 hours.  Other results: EKG:SR Imaging: Dg Tibia/fibula Right  01/30/2015   CLINICAL DATA:  Fall today, injury, pain, swelling and distal right tibia/ankle.  EXAM: RIGHT TIBIA AND FIBULA - 2 VIEW  COMPARISON:  None.  FINDINGS: There is an oblique comminuted fracture through the distal shaft of the right fibula. Transverse fracture at the base of the medial malleolus, better seen on the ankle series.  IMPRESSION: Mildly comminuted distal fibular shaft fracture. Transverse medial malleolar fracture.   Electronically Signed   By: Charlett Nose M.D.   On: 01/30/2015 13:43   Dg Ankle Complete Right  01/30/2015   CLINICAL DATA:  Pain and swelling following fall  EXAM: RIGHT ANKLE - COMPLETE 3+ VIEW  COMPARISON:  None.  FINDINGS: Frontal, oblique, and lateral views obtained. There is  a comminuted fracture of the medial malleolus with mildly displaced fracture fragments. There is an obliquely oriented fracture of the distal fibular diaphysis, comminuted. Major fracture fragments are in near anatomic alignment. The ankle mortise appears grossly intact. There is no appreciable joint effusion. There is osteoarthritic change with spurring in the dorsal midfoot region. There is a posterior calcaneal spur. There are multiple vascular calcifications.  IMPRESSION: Comminuted fractures of the medial malleolus and distal fibular diaphysis. Ankle mortise appears grossly intact.   Electronically Signed   By: Bretta Bang III M.D.   On: 01/30/2015 13:43      Medications:     Current Medications: . aspirin EC  81 mg Oral Daily  .  ceFAZolin (ANCEF) IV  2 g Intravenous On Call to OR  . docusate sodium  100 mg Oral BID  . ferrous sulfate  325 mg Oral BID WC  . insulin aspart  0-9 Units Subcutaneous TID WC  . insulin glargine  10 Units Subcutaneous QHS  . losartan  50 mg Oral Daily  . simvastatin  20 mg Oral QPM  . sodium chloride  3 mL Intravenous Q12H     Infusions:      Assessment:  1. Uncontrolled DM-  2. R fractured Ankle- Fall  3. Chronic Systolic Heart Failure 4. Carotic Stenosis 5. S/P  AVR/MVR 6. Hyperlipidemia 7. H/O Post op AF 8. Falls   Plan/Discussion:     Dr Gala Romney to address surgical risk.   HF team will follow after surgery.   Length of Stay: 1  CLEGG,AMY NP-C  01/31/2015, 8:16 AM  Advanced Heart Failure Team Pager [phone] (M-F; 7a - 4p)  Please contact [facility] for night-coverage after hours (4p -7a ) and weekends on [email or web address]  Patient seen and examined with Tonye Becket, NP. We discussed all aspects of the encounter. I agree with the assessment and plan as stated above.   She has been relatively stable from a HF perspective over past few months but EF remains quite low and on exam neck veins are up and has s3. Thus I think  she is at moderate to high risk for CV complications peri-operatively. I would favor trying to avoid general anesthesia and see if this can be done under a local anesthetic of block. I have left a message for Dr. Ophelia Charter regarding this. Would place Foley and givel lasix 40 IV now. We will follow.   Aloura Matsuoka,MD 1:51 PM

## 2015-01-31 NOTE — Progress Notes (Signed)
Received pt from OR via bed s/p ORIF rt ankle. AAOx4.IV of LR infusing at 10cc/hr. Tele reaactivated.CMT called. Pt put on frequent vitals.rt lower leg dsg intact and dry. Toes warm and mobile. Good cap refill less than 3 secs.ice pack applied to rt ankle. Pt without c/o pain.continued to observe closely

## 2015-01-31 NOTE — Brief Op Note (Signed)
01/30/2015 - 01/31/2015  6:36 PM  PATIENT:  Joanna [name]  81 y.o. female  PRE-OPERATIVE DIAGNOSIS:  right ankle fracture  POST-OPERATIVE DIAGNOSIS:  right ankle fracture  PROCEDURE:  Procedure(s): OPEN REDUCTION INTERNAL FIXATION (ORIF) ANKLE FRACTURE (Right) bimalleolar fracture ( fixation of both sides)  SURGEON:  Surgeon(s) and Role:    * Eldred Manges, MD - Primary  PHYSICIAN ASSISTANT:   ASSISTANTS: none   ANESTHESIA:   regional  EBL:  Total I/O In: 30 [P.O.:30] Out: 350 [Urine:350]  BLOOD ADMINISTERED:none  DRAINS: none   LOCAL MEDICATIONS USED:      SPECIMEN:  No Specimen  DISPOSITION OF SPECIMEN:  N/A  COUNTS:  YES  TOURNIQUET:   Total Tourniquet Time Documented: Calf (Right) - 23 minutes Total: Calf (Right) - 23 minutes   DICTATION: .Other Dictation: Dictation Number [id number]  PLAN OF CARE: inpt already  PATIENT DISPOSITION:  PACU - hemodynamically stable.   Delay start of Pharmacological VTE agent (>24hrs) due to surgical blood loss or risk of bleeding: not applicable

## 2015-01-31 NOTE — Anesthesia Preprocedure Evaluation (Addendum)
Anesthesia Evaluation  Patient identified by MRN, date of birth, ID band Patient awake    Reviewed: Allergy & Precautions, H&P , NPO status , Patient's Chart, lab work & pertinent test results  History of Anesthesia Complications (+) PONV and history of anesthetic complications  Airway Mallampati: II   Neck ROM: full    Dental   Pulmonary shortness of breath,  breath sounds clear to auscultation        Cardiovascular hypertension, + Peripheral Vascular Disease and +CHF + dysrhythmias + Valvular Problems/Murmurs MR Rhythm:Regular Rate:Normal  Study Conclusions  - Left ventricle: The cavity size was severely dilated. Wall thickness was increased in a pattern of mild LVH. Systolic function was severely reduced. The estimated ejection fraction was in the range of 20% to 25%. Diffuse hypokinesis. - Aortic valve: Bioprosthetic Aortic valve with no signs of AS or signficant perivalvular regurgitation. - Mitral valve: Bioporsthetic Mitral Valve with mild central MR and thickened leaflets Valve area by continuity equation (using LVOT flow): 0.48 cm^2. - Left atrium: The atrium was moderately to severely dilated. - Right ventricle: The cavity size was mildly dilated. - Right atrium: The atrium was mildly dilated. - Tricuspid valve: There was moderate regurgitation.     Neuro/Psych CVA    GI/Hepatic negative GI ROS, Neg liver ROS,   Endo/Other  diabetes, Type 2Patient had a blood glucose level of 67 and was treated with half an ampule of D50   Renal/GU negative Renal ROS     Musculoskeletal  (+) Arthritis -,   Abdominal   Peds  Hematology   Anesthesia Other Findings   Reproductive/Obstetrics                            Anesthesia Physical  Anesthesia Plan  ASA: IV  Anesthesia Plan: Regional and MAC   Post-op Pain Management:    Induction:   Airway Management Planned:    Additional Equipment:   Intra-op Plan:   Post-operative Plan:   Informed Consent: I have reviewed the patients History and Physical, chart, labs and discussed the procedure including the risks, benefits and alternatives for the proposed anesthesia with the patient or authorized representative who has indicated his/her understanding and acceptance.   Dental advisory given  Plan Discussed with: CRNA, Anesthesiologist and Surgeon  Anesthesia Plan Comments: (Per cardiologist "EF remains quite low and on exam neck veins are up and has s3. Thus I think she is at moderate to high risk for CV complications peri-operatively")       Anesthesia Quick Evaluation

## 2015-01-31 NOTE — Transfer of Care (Signed)
Immediate Anesthesia Transfer of Care Note  Patient: Joanna [name]  Procedure(s) Performed: Procedure(s): OPEN REDUCTION INTERNAL FIXATION (ORIF) ANKLE FRACTURE (Right)  Patient Location: PACU  Anesthesia Type:MAC  Level of Consciousness: awake  Airway & Oxygen Therapy: Patient Spontanous Breathing and Patient connected to nasal cannula oxygen  Post-op Assessment: Report given to RN and Post -op Vital signs reviewed and stable  Post vital signs: Reviewed and stable  Last Vitals:  Filed Vitals:   01/31/15 1725  BP: 94/54  Pulse: 96  Temp:   Resp: 13    Complications: No apparent anesthesia complications

## 2015-01-31 NOTE — Progress Notes (Signed)
[facility] PROGRESS NOTE  Joanna [name] OFV:[id number] DOB: 1930/02/09 DOA: 01/30/2015 PCP: Joanna Linger, MD  Assessment/Plan: 1. Hyperglycemia 1. Glucose improved 2. Cont on current subQ insulin regimen 3. Cont SSI coverage 2. Comminuted distal fibular shaft fracture 1. Orthopedic surgery following 2. Plans for surgery later this afternoon at around 5pm 3. Combined chronic systolic and diastolic CHF 1. Cardiology following 2. Mod-high risk of cardiac complications. 3. Recs for lasix 40mg  IV x 1 and suggestion for local anesthesia if possible 4. CKD3 1. Cr stable 2. Continue to monitor 5. HTN 1. bp stable 6. HLD 1. Cont statin 7. DVT prophylaxis 1. SCD's  Code Status: Full Family Communication: Pt in room, wife, daughter at bedside Disposition Plan: Possible d/c 4/14   Consultants:  General surgery  Critical Care.  Hematology  Procedures:    Antibiotics:    HPI/Subjective: Reports ankle pain, eager to have surgery  Objective: Filed Vitals:   01/31/15 0410 01/31/15 0510 01/31/15 0900 01/31/15 1425  BP: 87/46 96/55 98/54  100/47  Pulse: 81 85 88 81  Temp:  98.7 F (37.1 C)  99.7 F (37.6 C)  TempSrc:  Oral  Oral  Resp:  20  18  Height:      Weight:  54.7 kg (120 lb 9.5 oz)    SpO2:  100%  97%    Intake/Output Summary (Last 24 hours) at 01/31/15 1448 Last data filed at 01/31/15 1007  Gross per 24 hour  Intake   2550 ml  Output    850 ml  Net   1700 ml   Filed Weights   01/30/15 1534 01/30/15 1829 01/31/15 0510  Weight: 56.246 kg (124 lb) 54.3 kg (119 lb 11.4 oz) 54.7 kg (120 lb 9.5 oz)    Exam:   General:  Awake, in nad  Cardiovascular: regular,s 1, s2  Respiratory: normal resp effort, no wheezing  Abdomen: soft,nondistended  Musculoskeletal: perfused, no clubbing   Data Reviewed: Basic Metabolic Panel:  Recent Labs Lab 01/30/15 1344 01/30/15 2209 01/31/15 0338  NA 128* 137 136  K 4.1 3.2* 4.0  CL 89* 101 101   CO2 25 29 24   GLUCOSE 741* 93 93  BUN 45* 35* 37*  CREATININE 1.70* 1.45* 1.42*  CALCIUM 10.5 9.7 9.4   Liver Function Tests:  Recent Labs Lab 01/30/15 1344 01/31/15 0338  AST 21 23  ALT 20 16  ALKPHOS 46 35*  BILITOT 1.0 0.6  PROT 6.2 5.1*  ALBUMIN 3.8 3.1*   No results for input(s): LIPASE, AMYLASE in the last 168 hours. No results for input(s): AMMONIA in the last 168 hours. CBC:  Recent Labs Lab 01/30/15 1344 01/31/15 0338  WBC 9.0 11.2*  NEUTROABS 7.6  --   HGB 10.7* 9.7*  HCT 31.1* 27.9*  MCV 86.6 88.0  PLT 195 197   Cardiac Enzymes: No results for input(s): CKTOTAL, CKMB, CKMBINDEX, TROPONINI in the last 168 hours. BNP (last 3 results)  Recent Labs  12/13/14 1240  BNP 3083.2*    ProBNP (last 3 results) No results for input(s): PROBNP in the last 8760 hours.  CBG:  Recent Labs Lab 01/31/15 0156 01/31/15 0610 01/31/15 0810 01/31/15 0954 01/31/15 1111  GLUCAP 146* 65* 95 72 72    No results found for this or any previous visit (from the past 240 hour(s)).   Studies: Dg Tibia/fibula Right  01/30/2015   CLINICAL DATA:  Fall today, injury, pain, swelling and distal right tibia/ankle.  EXAM: RIGHT TIBIA AND FIBULA -  2 VIEW  COMPARISON:  None.  FINDINGS: There is an oblique comminuted fracture through the distal shaft of the right fibula. Transverse fracture at the base of the medial malleolus, better seen on the ankle series.  IMPRESSION: Mildly comminuted distal fibular shaft fracture. Transverse medial malleolar fracture.   Electronically Signed   By: Charlett Nose M.D.   On: 01/30/2015 13:43   Dg Ankle Complete Right  01/30/2015   CLINICAL DATA:  Pain and swelling following fall  EXAM: RIGHT ANKLE - COMPLETE 3+ VIEW  COMPARISON:  None.  FINDINGS: Frontal, oblique, and lateral views obtained. There is a comminuted fracture of the medial malleolus with mildly displaced fracture fragments. There is an obliquely oriented fracture of the distal fibular  diaphysis, comminuted. Major fracture fragments are in near anatomic alignment. The ankle mortise appears grossly intact. There is no appreciable joint effusion. There is osteoarthritic change with spurring in the dorsal midfoot region. There is a posterior calcaneal spur. There are multiple vascular calcifications.  IMPRESSION: Comminuted fractures of the medial malleolus and distal fibular diaphysis. Ankle mortise appears grossly intact.   Electronically Signed   By: Bretta Bang III M.D.   On: 01/30/2015 13:43    Scheduled Meds: . aspirin EC  81 mg Oral Daily  .  ceFAZolin (ANCEF) IV  2 g Intravenous On Call to OR  . docusate sodium  100 mg Oral BID  . ferrous sulfate  325 mg Oral BID WC  . furosemide  40 mg Intravenous Once  . insulin aspart  0-9 Units Subcutaneous TID WC  . insulin glargine  10 Units Subcutaneous QHS  . simvastatin  20 mg Oral QPM  . sodium chloride  3 mL Intravenous Q12H   Continuous Infusions: . dextrose 5 % and 0.9% NaCl 50 mL/hr at 01/31/15 1309    Active Problems:   DM (diabetes mellitus), type 2 with renal complications   Hyperlipidemia with target LDL less than 100   Essential hypertension   Iron deficiency anemia   Chronic systolic heart failure   Mitral regurgitation   S/P aortic and mitral valve replacement with bioprosthetic valves   Chronic diastolic CHF (congestive heart failure)   CKD (chronic kidney disease) stage 3, GFR 30-59 ml/min   PAF (paroxysmal atrial fibrillation)   Hyperglycemia    Joanna [name] K  [facility] Pager [phone]. If 7PM-7AM, please contact night-coverage at [email or web address], password [email or web address] 01/31/2015, 2:48 PM  LOS: 1 day

## 2015-01-31 NOTE — H&P (View-Only) (Signed)
Reason for Consult:right ankle fracture Referring Physician: Cruzita Lederer MD  Joanna [name] is an 81 y.o. female.  HPI: fall with acute right ankle pain and inability to ambulate  Past Medical History  Diagnosis Date  . Peripheral artery disease   . Hyperlipidemia     takes Simvastatin daily  . PONV (postoperative nausea and vomiting)   . Shortness of breath   . Arthritis   . Aortic stenosis 09/21/2011  . AORTIC VALVE SCLEROSIS 02/21/2010  . 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 BUNDLE BRANCH BLOCK, LEFT 07/30/2007  . CAROTID ARTERY STENOSIS 01/05/2009  . CVA 03/14/2010  . 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 PERIPHERAL VASCULAR DISEASE 07/30/2007  . POLYPECTOMY, HX OF 07/30/2007  . Constipation   . Chronic combined systolic and diastolic CHF (congestive heart failure)     a. [place] 09/2011 RA 8, RV 58/2/11; PA 54/22 (37) PCWP 20; F CO/CI 4.57/2.67 PVR 3.7; b. L main no sig dz, LAD luminal irreg; LCx lg Ramus with luminal irreg, small AV Lcx witout sig dz, RCA luminal irreg; severe mitral annular calcifications; AV calcified c. EF 45-50% (07/2013);  d. 11/2014 Echo: EF 20-25%, diff HK, nl AV/MV, sev dil LA, mod TR/PR, PASP 80mHg.  . Diabetes mellitus   . DM 07/30/2007  . HTN (hypertension)   . HYPERTENSION 07/30/2007  . Weakness   . Dizziness   . Joint pain   . Itching   . Anemia   . Iron deficiency anemia 09/21/2011  . Nocturia   . History of blood transfusion   . History of shingles   . Mitral regurgitation   . S/P aortic valve replacement with bioprosthetic valve 06/15/2013    [facility]  . S/P mitral valve replacement with bioprosthetic valve 06/15/2013    [facility]  . Fracture, fibula, shaft 01/30/2015    right    Past Surgical History  Procedure Laterality Date  . Carotid endarterectomy Left 2011  . Polypectomy  12/27/04  . Cataract extraction Bilateral 2009  . Tee without cardioversion  N/A 05/19/2013    Procedure: TRANSESOPHAGEAL ECHOCARDIOGRAM (TEE);  Surgeon: DLarey Dresser MD;  [place]  Service: Cardiovascular;  Laterality: N/A;  . Colonoscopy    . Cardiac catheterization  2012  . Aortic valve replacement N/A 06/15/2013    Procedure: AORTIC VALVE REPLACEMENT (AVR);  Surgeon: CRexene Alberts MD;  [place]  Service: Open Heart Surgery;  Laterality: N/A;  . Intraoperative transesophageal echocardiogram N/A 06/15/2013    Procedure: INTRAOPERATIVE TRANSESOPHAGEAL ECHOCARDIOGRAM;  Surgeon: CRexene Alberts MD;  [place]  Service: Open Heart Surgery;  Laterality: N/A;  . Mitral valve replacement N/A 06/15/2013    Procedure: MITRAL VALVE (MV) REPLACEMENT;  Surgeon: CRexene Alberts MD;  [place]  Service: Open Heart Surgery;  Laterality: N/A;  . Epicardial pacing lead placement N/A 06/15/2013    Procedure: EPICARDIAL PACING LEAD PLACEMENT;  Surgeon: CRexene Alberts MD;  [place]  Service: Open Heart Surgery;  Laterality: N/A;  . Left and right heart catheterization with coronary angiogram N/A 09/19/2011    Procedure: LEFT AND RIGHT HEART CATHETERIZATION WITH CORONARY ANGIOGRAM;  Surgeon: DJolaine Artist MD;  [facility]  Service: Cardiovascular;  Laterality: N/A;    Family History  Problem Relation Age of Onset  . Colon cancer Mother   . Diabetes Neg Hx   . Coronary artery disease Neg Hx  Social History:  reports that she has never smoked. She has never used smokeless tobacco. She reports that she does not drink alcohol or use illicit drugs.  Allergies:  Allergies  Allergen Reactions  . Other Hives    STEROIDS  . Prednisone Hives and Other (See Comments)    She is allergic to all steroids!    Medications: I have reviewed the patients medication  Results for orders placed or performed during the hospital encounter of 01/30/15 (from the past 48 hour(s))  CBG monitoring, ED     Status: Abnormal    Collection Time: 01/30/15 12:13 PM  Result Value Ref Range   Glucose-Capillary >600 (HH) 70 - 99 mg/dL   Comment 1 Notify RN    Comment 2 Document in Chart   CBC with Differential/Platelet     Status: Abnormal   Collection Time: 01/30/15  1:44 PM  Result Value Ref Range   WBC 9.0 4.0 - 10.5 K/uL   RBC 3.59 (L) 3.87 - 5.11 MIL/uL   Hemoglobin 10.7 (L) 12.0 - 15.0 g/dL   HCT 31.1 (L) 36.0 - 46.0 %   MCV 86.6 78.0 - 100.0 fL   MCH 29.8 26.0 - 34.0 pg   MCHC 34.4 30.0 - 36.0 g/dL   RDW 13.1 11.5 - 15.5 %   Platelets 195 150 - 400 K/uL   Neutrophils Relative % 84 (H) 43 - 77 %   Neutro Abs 7.6 1.7 - 7.7 K/uL   Lymphocytes Relative 8 (L) 12 - 46 %   Lymphs Abs 0.7 0.7 - 4.0 K/uL   Monocytes Relative 5 3 - 12 %   Monocytes Absolute 0.4 0.1 - 1.0 K/uL   Eosinophils Relative 3 0 - 5 %   Eosinophils Absolute 0.3 0.0 - 0.7 K/uL   Basophils Relative 0 0 - 1 %   Basophils Absolute 0.0 0.0 - 0.1 K/uL  Comprehensive metabolic panel     Status: Abnormal   Collection Time: 01/30/15  1:44 PM  Result Value Ref Range   Sodium 128 (L) 135 - 145 mmol/L   Potassium 4.1 3.5 - 5.1 mmol/L   Chloride 89 (L) 96 - 112 mmol/L   CO2 25 19 - 32 mmol/L   Glucose, Bld 741 (HH) 70 - 99 mg/dL    Comment: REPEATED TO VERIFY CRITICAL RESULT CALLED TO, READ BACK BY AND VERIFIED WITH: BEASLEY,M RN @ 1424 01/30/15 LEONARD,A    BUN 45 (H) 6 - 23 mg/dL   Creatinine, Ser 1.70 (H) 0.50 - 1.10 mg/dL   Calcium 10.5 8.4 - 10.5 mg/dL   Total Protein 6.2 6.0 - 8.3 g/dL   Albumin 3.8 3.5 - 5.2 g/dL   AST 21 0 - 37 U/L   ALT 20 0 - 35 U/L   Alkaline Phosphatase 46 39 - 117 U/L   Total Bilirubin 1.0 0.3 - 1.2 mg/dL   GFR calc non Af Amer 27 (L) >90 mL/min   GFR calc Af Amer 31 (L) >90 mL/min    Comment: (NOTE) The eGFR has been calculated using the CKD EPI equation. This calculation has not been validated in all clinical situations. eGFR's persistently <90 mL/min signify possible Chronic Kidney Disease.    Anion  gap 14 5 - 15  I-stat troponin, ED     Status: None   Collection Time: 01/30/15  1:50 PM  Result Value Ref Range   Troponin i, poc 0.04 0.00 - 0.08 ng/mL   Comment 3  Comment: Due to the release kinetics of cTnI, a negative result within the first hours of the onset of symptoms does not rule out myocardial infarction with certainty. If myocardial infarction is still suspected, repeat the test at appropriate intervals.   Urinalysis, Routine w reflex microscopic     Status: Abnormal   Collection Time: 01/30/15  2:44 PM  Result Value Ref Range   Color, Urine YELLOW YELLOW   APPearance CLEAR CLEAR   Specific Gravity, Urine 1.018 1.005 - 1.030   pH 6.5 5.0 - 8.0   Glucose, UA >1000 (A) NEGATIVE mg/dL   Hgb urine dipstick NEGATIVE NEGATIVE   Bilirubin Urine NEGATIVE NEGATIVE   Ketones, ur NEGATIVE NEGATIVE mg/dL   Protein, ur NEGATIVE NEGATIVE mg/dL   Urobilinogen, UA 0.2 0.0 - 1.0 mg/dL   Nitrite NEGATIVE NEGATIVE   Leukocytes, UA NEGATIVE NEGATIVE  Urine microscopic-add on     Status: None   Collection Time: 01/30/15  2:44 PM  Result Value Ref Range   Squamous Epithelial / LPF RARE RARE   WBC, UA 0-2 <3 WBC/hpf   Bacteria, UA RARE RARE  CBG monitoring, ED     Status: Abnormal   Collection Time: 01/30/15  2:58 PM  Result Value Ref Range   Glucose-Capillary 591 (HH) 70 - 99 mg/dL   Comment 1 Notify RN    Comment 2 Document in Chart   CBG monitoring, ED     Status: Abnormal   Collection Time: 01/30/15  3:52 PM  Result Value Ref Range   Glucose-Capillary 497 (H) 70 - 99 mg/dL   Comment 1 Notify RN    Comment 2 Document in Chart   CBG monitoring, ED     Status: Abnormal   Collection Time: 01/30/15  4:55 PM  Result Value Ref Range   Glucose-Capillary 366 (H) 70 - 99 mg/dL  CBG monitoring, ED     Status: Abnormal   Collection Time: 01/30/15  5:48 PM  Result Value Ref Range   Glucose-Capillary 263 (H) 70 - 99 mg/dL  Glucose, capillary     Status: Abnormal    Collection Time: 01/30/15  6:54 PM  Result Value Ref Range   Glucose-Capillary 220 (H) 70 - 99 mg/dL   Comment 1 Notify RN   Glucose, capillary     Status: Abnormal   Collection Time: 01/30/15  7:59 PM  Result Value Ref Range   Glucose-Capillary 185 (H) 70 - 99 mg/dL  Glucose, capillary     Status: None   Collection Time: 01/30/15  9:04 PM  Result Value Ref Range   Glucose-Capillary 89 70 - 99 mg/dL   Comment 1 Notify RN    Comment 2 Document in Chart   Basic metabolic panel     Status: Abnormal   Collection Time: 01/30/15 10:09 PM  Result Value Ref Range   Sodium 137 135 - 145 mmol/L   Potassium 3.2 (L) 3.5 - 5.1 mmol/L   Chloride 101 96 - 112 mmol/L   CO2 29 19 - 32 mmol/L   Glucose, Bld 93 70 - 99 mg/dL   BUN 35 (H) 6 - 23 mg/dL   Creatinine, Ser 1.45 (H) 0.50 - 1.10 mg/dL   Calcium 9.7 8.4 - 10.5 mg/dL   GFR calc non Af Amer 33 (L) >90 mL/min   GFR calc Af Amer 38 (L) >90 mL/min    Comment: (NOTE) The eGFR has been calculated using the CKD EPI equation. This calculation has not been validated in all  clinical situations. eGFR's persistently <90 mL/min signify possible Chronic Kidney Disease.    Anion gap 7 5 - 15  Glucose, capillary     Status: None   Collection Time: 01/30/15 10:17 PM  Result Value Ref Range   Glucose-Capillary 95 70 - 99 mg/dL    Dg Tibia/fibula Right  01/30/2015   CLINICAL DATA:  Fall today, injury, pain, swelling and distal right tibia/ankle.  EXAM: RIGHT TIBIA AND FIBULA - 2 VIEW  COMPARISON:  None.  FINDINGS: There is an oblique comminuted fracture through the distal shaft of the right fibula. Transverse fracture at the base of the medial malleolus, better seen on the ankle series.  IMPRESSION: Mildly comminuted distal fibular shaft fracture. Transverse medial malleolar fracture.   Electronically Signed   By: Rolm Baptise M.D.   On: 01/30/2015 13:43   Dg Ankle Complete Right  01/30/2015   CLINICAL DATA:  Pain and swelling following fall  EXAM:  RIGHT ANKLE - COMPLETE 3+ VIEW  COMPARISON:  None.  FINDINGS: Frontal, oblique, and lateral views obtained. There is a comminuted fracture of the medial malleolus with mildly displaced fracture fragments. There is an obliquely oriented fracture of the distal fibular diaphysis, comminuted. Major fracture fragments are in near anatomic alignment. The ankle mortise appears grossly intact. There is no appreciable joint effusion. There is osteoarthritic change with spurring in the dorsal midfoot region. There is a posterior calcaneal spur. There are multiple vascular calcifications.  IMPRESSION: Comminuted fractures of the medial malleolus and distal fibular diaphysis. Ankle mortise appears grossly intact.   Electronically Signed   By: Lowella Grip III M.D.   On: 01/30/2015 13:43    Review of Systems  Constitutional: Negative for fever and chills.  Cardiovascular:       Hx of heart failure, valve replacement. Carotid endarterectomy  Neurological: Positive for dizziness.  Endo/Heme/Allergies:       Diabetes   Blood pressure 91/54, pulse 80, temperature 98.9 F (37.2 C), temperature source Oral, resp. rate 16, height _0  (1.575 m), weight 54.3 kg (119 lb 11.4 oz), SpO2 100 %. Physical Exam  Constitutional: She is oriented to person, place, and time. She appears well-developed.  HENT:  Head: Atraumatic.  Eyes: Pupils are equal, round, and reactive to light.  Neck: Normal range of motion.  Cardiovascular: Normal rate.   Respiratory: Effort normal.  GI: Soft.  Musculoskeletal:  Right ankle splinted. Cap refill normal  Neurological: She is alert and oriented to person, place, and time.  Skin: Skin is warm and dry.  Psychiatric: She has a normal mood and affect.    Assessment/Plan: Right ankle bimalleolar fracture with displacement. Will place on schedule tomorrow at about 5 pm . May be better candidate for regional anesthesia with cardiac Hx.       My phone [phone]  Marybelle Killings 01/30/2015, 11:10 PM

## 2015-02-01 ENCOUNTER — Encounter (HOSPITAL_COMMUNITY): Payer: Self-pay | Admitting: Orthopaedic Surgery

## 2015-02-01 DIAGNOSIS — S82899A Other fracture of unspecified lower leg, initial encounter for closed fracture: Secondary | ICD-10-CM | POA: Insufficient documentation

## 2015-02-01 DIAGNOSIS — S82891A Other fracture of right lower leg, initial encounter for closed fracture: Secondary | ICD-10-CM

## 2015-02-01 DIAGNOSIS — E440 Moderate protein-calorie malnutrition: Secondary | ICD-10-CM | POA: Insufficient documentation

## 2015-02-01 LAB — BASIC METABOLIC PANEL
Anion gap: 8 (ref 5–15)
BUN: 24 mg/dL — AB (ref 6–23)
CO2: 28 mmol/L (ref 19–32)
Calcium: 9.3 mg/dL (ref 8.4–10.5)
Chloride: 104 mmol/L (ref 96–112)
Creatinine, Ser: 1.2 mg/dL — ABNORMAL HIGH (ref 0.50–1.10)
GFR, EST AFRICAN AMERICAN: 47 mL/min — AB (ref >90)
GFR, EST NON AFRICAN AMERICAN: 41 mL/min — AB (ref >90)
Glucose, Bld: 114 mg/dL — ABNORMAL HIGH (ref 70–99)
POTASSIUM: 3.6 mmol/L (ref 3.5–5.1)
SODIUM: 140 mmol/L (ref 135–145)

## 2015-02-01 LAB — CBC
HCT: 28.3 % — ABNORMAL LOW (ref 36.0–46.0)
Hemoglobin: 9.5 g/dL — ABNORMAL LOW (ref 12.0–15.0)
MCH: 30.4 pg (ref 26.0–34.0)
MCHC: 33.6 g/dL (ref 30.0–36.0)
MCV: 90.4 fL (ref 78.0–100.0)
PLATELETS: 169 10*3/uL (ref 150–400)
RBC: 3.13 MIL/uL — AB (ref 3.87–5.11)
RDW: 13.6 % (ref 11.5–15.5)
WBC: 8.7 10*3/uL (ref 4.0–10.5)

## 2015-02-01 LAB — GLUCOSE, CAPILLARY
GLUCOSE-CAPILLARY: 98 mg/dL (ref 70–99)
GLUCOSE-CAPILLARY: 406 mg/dL — AB (ref 70–99)
Glucose-Capillary: 311 mg/dL — ABNORMAL HIGH (ref 70–99)
Glucose-Capillary: 425 mg/dL — ABNORMAL HIGH (ref 70–99)
Glucose-Capillary: 431 mg/dL — ABNORMAL HIGH (ref 70–99)
Glucose-Capillary: 472 mg/dL — ABNORMAL HIGH (ref 70–99)
Glucose-Capillary: 193 mg/dL — ABNORMAL HIGH (ref 70–99)

## 2015-02-01 MED ORDER — INSULIN GLARGINE 100 UNIT/ML ~~LOC~~ SOLN
30.0000 [IU] | Freq: Every day | SUBCUTANEOUS | Status: DC
  Administered 2015-02-01: 30 [IU] via SUBCUTANEOUS
  Filled 2015-02-01 (×2): qty 0.3

## 2015-02-01 MED ORDER — INSULIN ASPART 100 UNIT/ML ~~LOC~~ SOLN
10.0000 [IU] | Freq: Once | SUBCUTANEOUS | Status: AC
  Administered 2015-02-01: 10 [IU] via SUBCUTANEOUS

## 2015-02-01 MED ORDER — INSULIN ASPART 100 UNIT/ML ~~LOC~~ SOLN
20.0000 [IU] | Freq: Once | SUBCUTANEOUS | Status: AC
  Administered 2015-02-01: 20 [IU] via SUBCUTANEOUS

## 2015-02-01 MED ORDER — POTASSIUM CHLORIDE CRYS ER 20 MEQ PO TBCR
40.0000 meq | EXTENDED_RELEASE_TABLET | Freq: Once | ORAL | Status: AC
  Administered 2015-02-01: 40 meq via ORAL
  Filled 2015-02-01: qty 2

## 2015-02-01 MED ORDER — GLUCERNA SHAKE PO LIQD: 237.0000 mL | ORAL | Status: DC

## 2015-02-01 MED ORDER — FUROSEMIDE 10 MG/ML IJ SOLN
40.0000 mg | Freq: Two times a day (BID) | INTRAMUSCULAR | Status: AC
  Administered 2015-02-01 (×2): 40 mg via INTRAVENOUS
  Filled 2015-02-01 (×2): qty 4

## 2015-02-01 MED ORDER — INSULIN ASPART 100 UNIT/ML ~~LOC~~ SOLN
15.0000 [IU] | Freq: Once | SUBCUTANEOUS | Status: AC
  Administered 2015-02-01: 15 [IU] via SUBCUTANEOUS

## 2015-02-01 MED ORDER — POTASSIUM CHLORIDE CRYS ER 20 MEQ PO TBCR
20.0000 meq | EXTENDED_RELEASE_TABLET | Freq: Every day | ORAL | Status: DC
  Administered 2015-02-02: 20 meq via ORAL
  Filled 2015-02-01 (×2): qty 1

## 2015-02-01 MED ORDER — INSULIN ASPART 100 UNIT/ML ~~LOC~~ SOLN: 15.0000 [IU] | Freq: Once | SUBCUTANEOUS | Status: DC

## 2015-02-01 MED ORDER — FUROSEMIDE 10 MG/ML IJ SOLN: 40.0000 mg | Freq: Two times a day (BID) | INTRAMUSCULAR | Status: DC

## 2015-02-01 NOTE — Evaluation (Signed)
Physical Therapy Evaluation Patient Details Name: Joanna [name] MRN: [id number] DOB: 01-14-1930 Today's Date: 02/01/2015   History of Present Illness  Joanna [name] is a 81 y.o. female has a past medical history significant for chronic systolic and diastolic heart failure, ejection fraction 20-25%, priro AVR/MVR 8/14, type 2 diabetes mellitus, hypertension, hyperlipidemia, presents to the hospital with chief complaint of fall with right ankle fx s/p ORIF 4/13  Clinical Impression  Pt very pleasant and excited to get OOB however having difficulty following commands for sequencing movement and maintaining weight off of RLE. Pt with below deficits (PT problem list) who will benefit from acute therapy to maximize mobility, function and safety with adherence to precautions.     Follow Up Recommendations SNF;Supervision/Assistance - 24 hour    Equipment Recommendations  Wheelchair (measurements PT);Wheelchair cushion (measurements PT);3in1 (PT)    Recommendations for Other Services       Precautions / Restrictions Precautions Precautions: Fall Restrictions Weight Bearing Restrictions: Yes RLE Weight Bearing: Touchdown weight bearing      Mobility  Bed Mobility Overal bed mobility: Needs Assistance Bed Mobility: Rolling;Sidelying to Sit Rolling: Min guard Sidelying to sit: Min assist       General bed mobility comments: cues for sequence, use of rail and assist to elevate trunk from surface  Transfers Overall transfer level: Needs assistance   Transfers: Sit to/from Stand;Stand Pivot Transfers Sit to Stand: Min assist Stand pivot transfers: Mod assist       General transfer comment: pt with max cues for hand placement and sequence with RLE on PT foot throughout to monitor tDWB status with pt able to push into standing but could not unweight body on arms with RW, RW removed and utilized armrest of chair for pivot to chair with mod assist for sequence and control of  pelvis  Ambulation/Gait Ambulation/Gait assistance:  (pt unable)              Stairs            Wheelchair Mobility    Modified Rankin (Stroke Patients Only)       Balance Overall balance assessment: Needs assistance   Sitting balance-Leahy Scale: Fair       Standing balance-Leahy Scale: Poor                               Pertinent Vitals/Pain Pain Assessment: No/denies pain  Pt with drop to 86% on RA and returned to 2L     Home Living Family/patient expects to be discharged to:: Skilled nursing facility Living Arrangements: Alone Available Help at Discharge: Available PRN/intermittently;Family Type of Home: House Home Access: Stairs to enter Entrance Stairs-Rails: Can reach both Entrance Stairs-Number of Steps: 1 Home Layout: One level Home Equipment: Walker - 2 wheels;Cane - single point      Prior Function Level of Independence: Independent with assistive device(s)         Comments: pt reports several recent falls, dgtr comes by to check on her a few times a week and son works fulltime, no 24hr assist available     Higher education careers adviser        Extremity/Trunk Assessment   Upper Extremity Assessment: Generalized weakness           Lower Extremity Assessment: Generalized weakness      Cervical / Trunk Assessment: Normal  Communication   Communication: No difficulties  Cognition Arousal/Alertness: Awake/alert Behavior During Therapy: [facility] for  tasks assessed/performed Overall Cognitive Status: Impaired/Different from baseline Area of Impairment: Safety/judgement     Memory: Decreased short-term memory;Decreased recall of precautions   Safety/Judgement: Decreased awareness of safety     General Comments: pt with difficulty following commands and maintaining TDWB despite multimodal cues for safety and sequence with transfers    General Comments      Exercises General Exercises - Lower Extremity Long Arc Quad:  AROM;Seated;10 reps;Both      Assessment/Plan    PT Assessment Patient needs continued PT services  PT Diagnosis Difficulty walking;Generalized weakness   PT Problem List Decreased strength;Decreased cognition;Decreased activity tolerance;Decreased balance;Decreased knowledge of precautions;Decreased knowledge of use of DME;Decreased mobility  PT Treatment Interventions DME instruction;Functional mobility training;Therapeutic activities;Therapeutic exercise;Patient/family education   PT Goals (Current goals can be found in the Care Plan section) Acute Rehab PT Goals Patient Stated Goal: be able to move PT Goal Formulation: With patient Time For Goal Achievement: 02/15/15 Potential to Achieve Goals: Fair    Frequency Min 3X/week   Barriers to discharge Decreased caregiver support      Co-evaluation               End of Session Equipment Utilized During Treatment: Gait belt Activity Tolerance: Patient tolerated treatment well Patient left: in chair;with call bell/phone within reach;with chair alarm set;with nursing/sitter in room Nurse Communication: Mobility status;Precautions;Weight bearing status         Time: 1020-1035 PT Time Calculation (min) (ACUTE ONLY): 15 min   Charges:   PT Evaluation $Initial PT Evaluation Tier I: 1 Procedure     PT G CodesDelorse Lek 02/01/2015, 10:44 AM Delaney Meigs, PT [phone]

## 2015-02-01 NOTE — Progress Notes (Signed)
OT Cancellation Note  Patient Details Name: Joanna [name] MRN: [id number] DOB: 1930-05-30   Cancelled Treatment:    Noted plan for SNF. Will defer OT eval to SNF  Shrika Milos, Metro Kung 02/01/2015, 10:52 AM

## 2015-02-01 NOTE — Clinical Social Work Psychosocial (Addendum)
Clinical Social Work Department BRIEF PSYCHOSOCIAL ASSESSMENT 01/31/2015  Patient:  Joanna [name], Joanna [name]     Account Number:  [id number]     Admit date:  01/30/2015  Clinical Social Worker:  Elam Dutch  Date/Time:  01/31/2015 04:25 PM  Referred by:  Physician  Date Referred:  01/31/2015 Referred for  SNF Placement   Other Referral:   Interview type:  Other - See comment Other interview type:   Patient and daughter- Virgilio Belling    PSYCHOSOCIAL DATA Living Status:  ALONE Admitted from facility:   Level of care:   Primary support name:  Virgilio Belling  [phone] Primary support relationship to patient:  CHILD, ADULT Degree of support available:   Very strong support    CURRENT CONCERNS Current Concerns  Post-Acute Placement   Other Concerns:    SOCIAL WORK ASSESSMENT / PLAN 81 year old female- admitted from home where she currently lives alone. Patient has been independent of her ADL's prior to this hospitalization.  She now has a right ankle fracture following a fall at home. She will undergo surgery tomorrow. CSW met with patient and daughter [name] today to discuss her after care needs.  They both request short term SNF and hope for placement at [facility]. The bed search process was discussed and SNF list provided to patient/daughter.  Active bed search will be initiated.  Fl2 will be placed on chart for MD's signature.   Assessment/plan status:  Psychosocial Support/Ongoing Assessment of Needs Other assessment/ plan:   Information/referral to community resources:   SNF bed list provided to patient and her daughter    PATIENT'S/FAMILY'S RESPONSE TO PLAN OF CARE: Patient was noted to be lying in bed upon CSW's visit to the room.  Patient related that she had been uncomfortable but it was "easing up"  now that the CNA had repositioned her. Patient was noted to be fully alert and oriented; very pleasant and welcoming to [place].  Her daughter stated that  she was hoping for a visit from a Education officer, museum and was pleased to have the discussion re: SNF placement.  CSW contacted [facility] per request of patient and explained their desire for a bed at [facility].  Nira Conn stated that she would review the Fl2 as soon as it was received.  Patient is aware of her need for surgery tomorrow. She stated that she will be glad when it is completed but denied any worries about the surgery. "I just want my foot fixed."  Support provided to patient and daughter and will follow up re: SNF placement.

## 2015-02-01 NOTE — Progress Notes (Addendum)
Advanced Heart Failure Rounding Note   Subjective:     Admitted after fall with fractured R ankle. Prior to admit she was to start insulin however she did not want perform insulin injections. Also has missed some medications. Had 4 falls recently. During that time she had increased thirst and urination. In the ED she hyperglycemic with glucose 741 and received IV fluids and started on insulin drip. Cardiac markers negative.  Yesterday she had ORIF R ankle.  Weight down 1 pound.   Complaining of of RLE pain. Denies SOB   Objective:   Weight Range:  Vital Signs:   Temp:  [97.8 F (36.6 C)-99.7 F (37.6 C)] 97.8 F (36.6 C) (04/14 0205) Pulse Rate:  [65-98] 83 (04/14 0205) Resp:  [13-21] 21 (04/13 2000) BP: (94-117)/(47-71) 102/50 mmHg (04/14 0205) SpO2:  [97 %-100 %] 100 % (04/14 0205) Last BM Date: 01/30/15  Weight change: Filed Weights   01/30/15 1534 01/30/15 1829 01/31/15 0510  Weight: 124 lb (56.246 kg) 119 lb 11.4 oz (54.3 kg) 120 lb 9.5 oz (54.7 kg)    Intake/Output:   Intake/Output Summary (Last 24 hours) at 02/01/15 1033 Last data filed at 02/01/15 0718  Gross per 24 hour  Intake 572.17 ml  Output   1685 ml  Net -1112.83 ml    Physical Exam: General: Elderly frail appearing. No resp difficulty HEENT: normal Neck: supple. JVP 8-9 prominent CV waves Carotids 2+ bilat; no bruits. No lymphadenopathy or thryomegaly appreciated. Cor: PMI nondisplaced. Regular rate & rhythm. 2/6 SEM RSR. + s3 Lungs: RML RLL LLL crackles.  Abdomen: soft, nontender, nondistended. No hepatosplenomegaly. No bruits or masses. Good bowel sounds. Extremities: no cyanosis, clubbing, rash, edema RLE dressing in place Neuro: alert & orientedx3, cranial nerves grossly intact. moves all 4 extremities w/o difficulty. Affect pleasant  Telemetry: SR  Labs: Basic Metabolic Panel:  Recent Labs Lab 01/30/15 1344 01/30/15 2209 01/31/15 0338 02/01/15 0535  NA 128* 137 136 140  K 4.1 3.2*  4.0 3.6  CL 89* 101 101 104  CO2 GLUCOSE 741* 93 93 114*  BUN 45* 35* 37* 24*  CREATININE 1.70* 1.45* 1.42* 1.20*  CALCIUM 10.5 9.7 9.4 9.3    Liver Function Tests:  Recent Labs Lab 01/30/15 1344 01/31/15 0338  AST 21 23  ALT 20 16  ALKPHOS 46 35*  BILITOT 1.0 0.6  PROT 6.2 5.1*  ALBUMIN 3.8 3.1*   No results for input(s): LIPASE, AMYLASE in the last 168 hours. No results for input(s): AMMONIA in the last 168 hours.  CBC:  Recent Labs Lab 01/30/15 1344 01/31/15 0338 02/01/15 0535  WBC 9.0 11.2* 8.7  NEUTROABS 7.6  --   --   HGB 10.7* 9.7* 9.5*  HCT 31.1* 27.9* 28.3*  MCV 86.6 88.0 90.4  PLT 195 197 169    Cardiac Enzymes: No results for input(s): CKTOTAL, CKMB, CKMBINDEX, TROPONINI in the last 168 hours.  BNP: BNP (last 3 results)  Recent Labs  12/13/14 1240  BNP 3083.2*    ProBNP (last 3 results) No results for input(s): PROBNP in the last 8760 hours.    Other results:  Imaging: Dg Tibia/fibula Right  01/30/2015   CLINICAL DATA:  Fall today, injury, pain, swelling and distal right tibia/ankle.  EXAM: RIGHT TIBIA AND FIBULA - 2 VIEW  COMPARISON:  None.  FINDINGS: There is an oblique comminuted fracture through the distal shaft of the right fibula. Transverse fracture at the base of the medial  malleolus, better seen on the ankle series.  IMPRESSION: Mildly comminuted distal fibular shaft fracture. Transverse medial malleolar fracture.   Electronically Signed   By: Charlett Nose M.D.   On: 01/30/2015 13:43   Dg Ankle 2 Views Right  01/31/2015   CLINICAL DATA:  Internal fixation of right ankle fractures.  EXAM: DG C-ARM 61-120 MIN; RIGHT ANKLE - 2 VIEW  COMPARISON:  01/30/2015  FINDINGS: There is a long plate and multiple screws transfixing the distal fibular shaft fracture with anatomic reduction. There are 2 malleolar screws transfixing the medial malleolus fracture. No complicating features.  IMPRESSION: Internal fixation of ankle fractures  with anatomic reduction.   Electronically Signed   By: Rudie Meyer M.D.   On: 01/31/2015 18:33   Dg Ankle Complete Right  01/30/2015   CLINICAL DATA:  Pain and swelling following fall  EXAM: RIGHT ANKLE - COMPLETE 3+ VIEW  COMPARISON:  None.  FINDINGS: Frontal, oblique, and lateral views obtained. There is a comminuted fracture of the medial malleolus with mildly displaced fracture fragments. There is an obliquely oriented fracture of the distal fibular diaphysis, comminuted. Major fracture fragments are in near anatomic alignment. The ankle mortise appears grossly intact. There is no appreciable joint effusion. There is osteoarthritic change with spurring in the dorsal midfoot region. There is a posterior calcaneal spur. There are multiple vascular calcifications.  IMPRESSION: Comminuted fractures of the medial malleolus and distal fibular diaphysis. Ankle mortise appears grossly intact.   Electronically Signed   By: Bretta Bang III M.D.   On: 01/30/2015 13:43   Dg C-arm 1-60 Min  01/31/2015   CLINICAL DATA:  Internal fixation of right ankle fractures.  EXAM: DG C-ARM 61-120 MIN; RIGHT ANKLE - 2 VIEW  COMPARISON:  01/30/2015  FINDINGS: There is a long plate and multiple screws transfixing the distal fibular shaft fracture with anatomic reduction. There are 2 malleolar screws transfixing the medial malleolus fracture. No complicating features.  IMPRESSION: Internal fixation of ankle fractures with anatomic reduction.   Electronically Signed   By: Rudie Meyer M.D.   On: 01/31/2015 18:33      Medications:     Scheduled Medications: . aspirin EC  81 mg Oral Daily  . docusate sodium  100 mg Oral BID  . enoxaparin (LOVENOX) injection  30 mg Subcutaneous Q24H  . feeding supplement (GLUCERNA SHAKE)  237 mL Oral Q24H  . ferrous sulfate  325 mg Oral BID WC  . insulin aspart  0-9 Units Subcutaneous TID WC  . insulin glargine  10 Units Subcutaneous QHS  . linagliptin  5 mg Oral Daily  .  simvastatin  20 mg Oral QPM  . sodium chloride  3 mL Intravenous Q12H     Infusions: . dextrose 5 % and 0.9% NaCl Stopped (01/31/15 1644)  . lactated ringers 10 mL/hr at 01/31/15 1707     PRN Medications:  acetaminophen **OR** acetaminophen, dextrose, feeding supplement (GLUCERNA SHAKE), fentaNYL, HYDROcodone-acetaminophen, meperidine (DEMEROL) injection, metoCLOPramide **OR** metoCLOPramide (REGLAN) injection, morphine injection, ondansetron **OR** ondansetron (ZOFRAN) IV   Assessment:  1. Uncontrolled DM-  2. R fractured Ankle- Fall ---> S/P 4/13 ORIF R ankle 3. Chronic Systolic Heart Failure 4. Carotic Stenosis 5. S/P AVR/MVR 6. Hyperlipidemia 7. H/O Post op AF 8. Falls   Plan/Discussion:    POD #1 ORIF R ankle  Mild volume overload noted. Give 40 mg IV lasix every 12 hours. Supplement K. No BB or Ace with hypotension.   Plan for SNF per primary  team.    Length of Stay: 2  CLEGG,AMY NP-C  02/01/2015, 10:33 AM  Advanced Heart Failure Team Pager [phone] (M-F; 7a - 4p)  Please contact [facility] for night-coverage after hours (4p -7a ) and weekends on [email or web address]  Patient seen and examined with Tonye Becket, NP. We discussed all aspects of the encounter. I agree with the assessment and plan as stated above.   Did well post-op. Volume status improving.Continue IV lasix today and switch to po in am. Incentive spirometer provided. Echo reviewed EF 20-25%.   Daniel Bensimhon,MD 1:31 PM

## 2015-02-01 NOTE — Progress Notes (Signed)
Subjective: Doing well.  Pain controlled.     Objective: Vital signs in last 24 hours: Temp:  [97.8 F (36.6 C)-99.7 F (37.6 C)] 97.8 F (36.6 C) (04/14 0205) Pulse Rate:  [65-98] 83 (04/14 0205) Resp:  [13-21] 21 (04/13 2000) BP: (94-117)/(47-71) 102/50 mmHg (04/14 0205) SpO2:  [97 %-100 %] 100 % (04/14 0205)  Intake/Output from previous day: 04/13 0701 - 04/14 0700 In: 602.2 [P.O.:30; I.V.:572.2] Out: 935 [Urine:925; Blood:10] Intake/Output this shift: Total I/O In: -  Out: 750 [Urine:750]   Recent Labs  01/30/15 1344 01/31/15 0338 02/01/15 0535  HGB 10.7* 9.7* 9.5*    Recent Labs  01/31/15 0338 02/01/15 0535  WBC 11.2* 8.7  RBC 3.17* 3.13*  HCT 27.9* 28.3*  PLT 197 169    Recent Labs  01/31/15 0338 02/01/15 0535  NA 136 140  K 4.0 3.6  CL 101 104  CO2 24 28  BUN 37* 24*  CREATININE 1.42* 1.20*  GLUCOSE 93 114*  CALCIUM 9.4 9.3   No results for input(s): LABPT, INR in the last 72 hours.  Exam:  Splint intact.   Moves toes well.    Assessment/Plan: Up with PT. Strict NWB right foot.  Elevation above heart level.     OWENS,JAMES M 02/01/2015, 8:46 AM

## 2015-02-01 NOTE — Progress Notes (Signed)
CBG recheck 406 tonight at 8PM. BG 90/50 manual. Pt asymptomatic. K kirby notified, ordered to check again at 22:00.   CBG re-check 193, 30 units lantus scheduled and given, BP now 95/52.

## 2015-02-01 NOTE — Clinical Social Work Placement (Addendum)
    Clinical Social Work Department CLINICAL SOCIAL WORK PLACEMENT NOTE 01/31/2015  Patient:  Joanna [name], Joanna [name]  Account Number:  [id number] Admit date:  01/30/2015  Clinical Social Worker:  Lupita Leash CROWDER, LCSW  Date/time:  01/31/2015 04:58 PM  Clinical Social Work is seeking post-discharge placement for this patient at the following level of care:   SKILLED NURSING   (*CSW will update this form in Epic as items are completed)   01/31/2015  Patient/family provided with [facility] Department of Clinical Social Work's list of facilities offering this level of care within the geographic area requested by the patient (or if unable, by the patient's family).  01/31/2015  Patient/family informed of their freedom to choose among providers that offer the needed level of care, that participate in Medicare, Medicaid or managed care program needed by the patient, have an available bed and are willing to accept the patient.  01/31/2015  Patient/family informed of [facility]' ownership interest in [facility], as well as of the fact that they are under no obligation to receive care at this facility.  PASARR submitted to EDS on 01/31/2015 PASARR number received on 01/31/2015  FL2 transmitted to all facilities in geographic area requested by pt/family on  01/31/2015 FL2 transmitted to all facilities within larger geographic area on   Patient informed that his/her managed care company has contracts with or will negotiate with  certain facilities, including the following:   NA     Patient/family informed of bed offers received:  02/02/2015 Patient chooses bed at  Physician recommends and patient chooses bed at    Patient to be transferred to  on  02/02/2015 Patient to be transferred to facility by Ambulance Patient and family notified of transfer on 02/02/2015 Name of family member notified:  Joanna [name]  The following physician request were entered in Epic: Physician Request   Please prepare priority discharge summary and prescriptions.  Please sign FL2.    Additional Comments:

## 2015-02-01 NOTE — Progress Notes (Signed)
[facility] PROGRESS NOTE  JAHZEEL RAILSBACK XBL:[id number] DOB: Aug 26, 1930 DOA: 01/30/2015 PCP: Sanda Linger, MD  Assessment/Plan: 1. Hyperglycemia 1. Glucose had improved but now trending up as pt's lantus was decreased to 10 units the night prior secondary to surgery 2. Will resume home dose of 30 units of lantus 3. Stop d5 fluids 4. Cont SSI coverage 2. Comminuted distal fibular shaft fracture 1. Orthopedic surgery following 2. S/p surgery 4/13 3. Combined chronic systolic and diastolic CHF 1. Cardiology following, appreciate input. 2. Pt is continued on 40mg  IV lasix bid, plans to transition to PO lasix in AM 4. CKD3 1. Cr stable 2. Continue to monitor 5. HTN 1. bp stable 6. HLD 1. Cont statin 7. DVT prophylaxis 1. SCD's  Code Status: Full Family Communication: Pt in room, daughter at bedside Disposition Plan: Possible d/c 4/15 to SNF   Consultants: Orthopedic surgery Cardiology  Procedures:  ORIF R medial and lateral malleolus under popliteal regional block plus IV sedation  Antibiotics:    HPI/Subjective: States ankle pain is much improved since surgery. Hungry  Objective: Filed Vitals:   02/01/15 0205 02/01/15 0556 02/01/15 1039 02/01/15 1325  BP: 102/50 95/68  115/68  Pulse: 83 77  107  Temp: 97.8 F (36.6 C) 98.6 F (37 C)  98.2 F (36.8 C)  TempSrc: Oral Oral  Oral  Resp:    20  Height:      Weight:      SpO2: 100% 99% 86% 100%    Intake/Output Summary (Last 24 hours) at 02/01/15 1505 Last data filed at 02/01/15 1300  Gross per 24 hour  Intake 1182.17 ml  Output   1835 ml  Net -652.83 ml   Filed Weights   01/30/15 1534 01/30/15 1829 01/31/15 0510  Weight: 56.246 kg (124 lb) 54.3 kg (119 lb 11.4 oz) 54.7 kg (120 lb 9.5 oz)    Exam:   General:  Awake, in nad, sitting in chair  Cardiovascular: regular,s 1, s2  Respiratory: normal resp effort, no crackles  Abdomen: soft,nondistended  Musculoskeletal: perfused, no clubbing    Data Reviewed: Basic Metabolic Panel:  Recent Labs Lab 01/30/15 1344 01/30/15 2209 01/31/15 0338 02/01/15 0535  NA 128* 137 136 140  K 4.1 3.2* 4.0 3.6  CL 89* 101 101 104  CO2 25 29 24 28   GLUCOSE 741* 93 93 114*  BUN 45* 35* 37* 24*  CREATININE 1.70* 1.45* 1.42* 1.20*  CALCIUM 10.5 9.7 9.4 9.3   Liver Function Tests:  Recent Labs Lab 01/30/15 1344 01/31/15 0338  AST 21 23  ALT 20 16  ALKPHOS 46 35*  BILITOT 1.0 0.6  PROT 6.2 5.1*  ALBUMIN 3.8 3.1*   No results for input(s): LIPASE, AMYLASE in the last 168 hours. No results for input(s): AMMONIA in the last 168 hours. CBC:  Recent Labs Lab 01/30/15 1344 01/31/15 0338 02/01/15 0535  WBC 9.0 11.2* 8.7  NEUTROABS 7.6  --   --   HGB 10.7* 9.7* 9.5*  HCT 31.1* 27.9* 28.3*  MCV 86.6 88.0 90.4  PLT 195 197 169   Cardiac Enzymes: No results for input(s): CKTOTAL, CKMB, CKMBINDEX, TROPONINI in the last 168 hours. BNP (last 3 results)  Recent Labs  12/13/14 1240  BNP 3083.2*    ProBNP (last 3 results) No results for input(s): PROBNP in the last 8760 hours.  CBG:  Recent Labs Lab 01/31/15 1736 01/31/15 1928 01/31/15 2150 02/01/15 0635 02/01/15 1227  GLUCAP 136* 88 83 98 311*  Recent Results (from the past 240 hour(s))  Urine culture     Status: None   Collection Time: 01/30/15  2:44 PM  Result Value Ref Range Status   Specimen Description URINE, CLEAN CATCH  Final   Special Requests NONE  Final   Colony Count   Final    50,000 COLONIES/ML Performed at [facility]    Culture   Final    Multiple bacterial morphotypes present, none predominant. Suggest appropriate recollection if clinically indicated. Performed at [facility]    Report Status 2015/02/02 FINAL  Final  Surgical pcr screen     Status: None   Collection Time: 02-Feb-2015  5:30 PM  Result Value Ref Range Status   MRSA, PCR NEGATIVE NEGATIVE Final   Staphylococcus aureus NEGATIVE NEGATIVE Final    Comment:         The Xpert SA Assay (FDA approved for NASAL specimens in patients over 32 years of age), is one component of a comprehensive surveillance program.  Test performance has been validated by [facility] for patients greater than or equal to 10 year old. It is not intended to diagnose infection nor to guide or monitor treatment.      Studies: Dg Ankle 2 Views Right  2015-02-02   CLINICAL DATA:  Internal fixation of right ankle fractures.  EXAM: DG C-ARM 61-120 MIN; RIGHT ANKLE - 2 VIEW  COMPARISON:  01/30/2015  FINDINGS: There is a long plate and multiple screws transfixing the distal fibular shaft fracture with anatomic reduction. There are 2 malleolar screws transfixing the medial malleolus fracture. No complicating features.  IMPRESSION: Internal fixation of ankle fractures with anatomic reduction.   Electronically Signed   By: Rudie Meyer M.D.   On: 02/02/15 18:33   Dg C-arm 1-60 Min  Feb 02, 2015   CLINICAL DATA:  Internal fixation of right ankle fractures.  EXAM: DG C-ARM 61-120 MIN; RIGHT ANKLE - 2 VIEW  COMPARISON:  01/30/2015  FINDINGS: There is a long plate and multiple screws transfixing the distal fibular shaft fracture with anatomic reduction. There are 2 malleolar screws transfixing the medial malleolus fracture. No complicating features.  IMPRESSION: Internal fixation of ankle fractures with anatomic reduction.   Electronically Signed   By: Rudie Meyer M.D.   On: 02-02-2015 18:33    Scheduled Meds: . aspirin EC  81 mg Oral Daily  . docusate sodium  100 mg Oral BID  . enoxaparin (LOVENOX) injection  30 mg Subcutaneous Q24H  . feeding supplement (GLUCERNA SHAKE)  237 mL Oral Q24H  . ferrous sulfate  325 mg Oral BID WC  . furosemide  40 mg Intravenous BID  . insulin aspart  0-9 Units Subcutaneous TID WC  . insulin glargine  10 Units Subcutaneous QHS  . linagliptin  5 mg Oral Daily  . [START ON 02/02/2015] potassium chloride  20 mEq Oral Daily  . simvastatin  20 mg  Oral QPM  . sodium chloride  3 mL Intravenous Q12H   Continuous Infusions: . dextrose 5 % and 0.9% NaCl Stopped (02-Feb-2015 1644)  . lactated ringers 10 mL/hr at 2015/02/02 1707    Active Problems:   DM (diabetes mellitus), type 2 with renal complications   Hyperlipidemia with target LDL less than 100   Essential hypertension   Iron deficiency anemia   Chronic systolic heart failure   Mitral regurgitation   S/P aortic and mitral valve replacement with bioprosthetic valves   Chronic diastolic CHF (congestive heart failure)   CKD (  chronic kidney disease) stage 3, GFR 30-59 ml/min   PAF (paroxysmal atrial fibrillation)   Hyperglycemia   Closed bimalleolar fracture of right ankle    Hager Compston K  [facility] Pager [phone]. If 7PM-7AM, please contact night-coverage at [email or web address], password [facility] 02/01/2015, 3:05 PM  LOS: 2 days

## 2015-02-02 DIAGNOSIS — S82841A Displaced bimalleolar fracture of right lower leg, initial encounter for closed fracture: Principal | ICD-10-CM

## 2015-02-02 DIAGNOSIS — E440 Moderate protein-calorie malnutrition: Secondary | ICD-10-CM

## 2015-02-02 LAB — CBC
HCT: 26.7 % — ABNORMAL LOW (ref 36.0–46.0)
Hemoglobin: 8.8 g/dL — ABNORMAL LOW (ref 12.0–15.0)
MCH: 29.8 pg (ref 26.0–34.0)
MCHC: 33 g/dL (ref 30.0–36.0)
MCV: 90.5 fL (ref 78.0–100.0)
PLATELETS: 169 10*3/uL (ref 150–400)
RBC: 2.95 MIL/uL — AB (ref 3.87–5.11)
RDW: 13.5 % (ref 11.5–15.5)
WBC: 8.1 10*3/uL (ref 4.0–10.5)

## 2015-02-02 LAB — BASIC METABOLIC PANEL
Anion gap: 9 (ref 5–15)
BUN: 38 mg/dL — ABNORMAL HIGH (ref 6–23)
CALCIUM: 9 mg/dL (ref 8.4–10.5)
CO2: 26 mmol/L (ref 19–32)
Chloride: 100 mmol/L (ref 96–112)
Creatinine, Ser: 1.86 mg/dL — ABNORMAL HIGH (ref 0.50–1.10)
GFR calc Af Amer: 28 mL/min — ABNORMAL LOW (ref >90)
GFR calc non Af Amer: 24 mL/min — ABNORMAL LOW (ref >90)
GLUCOSE: 69 mg/dL — AB (ref 70–99)
POTASSIUM: 4.2 mmol/L (ref 3.5–5.1)
SODIUM: 135 mmol/L (ref 135–145)

## 2015-02-02 LAB — GLUCOSE, CAPILLARY
Glucose-Capillary: 90 mg/dL (ref 70–99)
Glucose-Capillary: 258 mg/dL — ABNORMAL HIGH (ref 70–99)

## 2015-02-02 MED ORDER — HYDROCODONE-ACETAMINOPHEN 5-325 MG PO TABS: 1.0000 | ORAL_TABLET | ORAL | Status: DC | PRN

## 2015-02-02 MED ORDER — INSULIN ASPART 100 UNIT/ML ~~LOC~~ SOLN: 3.0000 [IU] | Freq: Three times a day (TID) | SUBCUTANEOUS | Status: DC

## 2015-02-02 MED ORDER — FUROSEMIDE 40 MG PO TABS: 40.0000 mg | ORAL_TABLET | Freq: Two times a day (BID) | ORAL | Status: DC

## 2015-02-02 MED ORDER — POTASSIUM CHLORIDE CRYS ER 20 MEQ PO TBCR: 20.0000 meq | EXTENDED_RELEASE_TABLET | Freq: Every day | ORAL | Status: DC

## 2015-02-02 MED ORDER — LIVING WELL WITH DIABETES BOOK
Freq: Once | Status: AC
  Administered 2015-02-02: 12:00:00
  Filled 2015-02-02: qty 1

## 2015-02-02 MED ORDER — INSULIN STARTER KIT- PEN NEEDLES (ENGLISH)
1.0000 | Freq: Once | Status: AC
  Administered 2015-02-02: 1
  Filled 2015-02-02: qty 1

## 2015-02-02 MED ORDER — INSULIN GLARGINE 300 UNIT/ML ~~LOC~~ SOPN: 20.0000 [IU] | PEN_INJECTOR | Freq: Every day | SUBCUTANEOUS | Status: DC

## 2015-02-02 MED ORDER — INSULIN GLARGINE 100 UNIT/ML ~~LOC~~ SOLN
20.0000 [IU] | Freq: Every day | SUBCUTANEOUS | Status: DC
  Filled 2015-02-02: qty 0.2

## 2015-02-02 MED ORDER — INSULIN ASPART 100 UNIT/ML FLEXPEN: 3.0000 [IU] | PEN_INJECTOR | Freq: Three times a day (TID) | SUBCUTANEOUS | Status: DC

## 2015-02-02 NOTE — Discharge Summary (Signed)
Physician Discharge Summary  Joanna [name]:[id number] DOB: [birth year] DOA: 01/30/2015  PCP: Sanda Linger, MD  Admit date: 01/30/2015 Discharge date: 02/02/2015  Time spent: 20 minutes  Recommendations for Outpatient Follow-up:  1. Follow up with PCP in 1-2 weeks 2. Follow up Heart Failure Clinic as scheduled 3. Follow up with Orthopedic Surgery as scheduled  Discharge Diagnoses:  Active Problems:   DM (diabetes mellitus), type 2 with renal complications   Hyperlipidemia with target LDL less than 100   Essential hypertension   Iron deficiency anemia   Chronic systolic heart failure   Mitral regurgitation   S/P aortic and mitral valve replacement with bioprosthetic valves   Chronic diastolic CHF (congestive heart failure)   CKD (chronic kidney disease) stage 3, GFR 30-59 ml/min   PAF (paroxysmal atrial fibrillation)   Hyperglycemia   Closed bimalleolar fracture of right ankle   Ankle fracture   Malnutrition of moderate degree   Discharge Condition: Improved  Diet recommendation: Heart healthy, diabetic  Filed Weights   01/30/15 1829 01/31/15 0510 02/02/15 0500  Weight: 54.3 kg (119 lb 11.4 oz) 54.7 kg (120 lb 9.5 oz) 57.97 kg (127 lb 12.8 oz)    History of present illness:  Please see admit h and p from 4/12 for details. Briefly , pt presented with fall, resulting in R ankle fracture in the setting of poorly controlled DM. The patient was admitted for further work up.  Hospital Course:   Hyperglycemia  Glucose had improved initially on insulin gtt but trended up as pt's lantus dose was decreased to accommodate for NPO status prior to surgery  Continued on lantus at 20 units QHS with 3 units pre-meal aspart  Cont SSI coverage while inpatient  Comminuted distal fibular shaft fracture  Orthopedic surgery following  S/p surgery 4/13  Planned for SNF  Acutely decompensated combined chronic systolic and diastolic CHF  Cardiology following  Developed mild  volume overload post-operatively  Pt was continued on 40mg  IV lasix, transitioned to PO lasix 4/14 with recommendations to hold lasix x 2 days (restart on 4/17) to allow renal function to improve  Acute on CKD3  Cr trended up with lasix diuresis  Cardiology recs to resume lasix on 4/17  Continue to monitor as outpatient  HTN  bp stable  HLD  Cont statin  DVT prophylaxis  SCD's  Procedures:  ORIF R medial and lateral malleolus under popliteal regional block plus IV sedation  Consultations:  Cardiology  Orthopedic Surgery  Discharge Exam: Filed Vitals:   02/02/15 0500 02/02/15 0542 02/02/15 0851 02/02/15 0952  BP:  100/54 98/56 98/61   Pulse:  79 81 88  Temp:  98.2 F (36.8 C) 98.4 F (36.9 C) 98.2 F (36.8 C)  TempSrc:  Oral Oral Oral  Resp:  18 17 16   Height:      Weight: 57.97 kg (127 lb 12.8 oz)     SpO2:  100% 98% 95%    General: Awake, in nad Cardiovascular: regular, s1, s2 Respiratory: normal resp effort, no wheezing  Discharge Instructions      Discharge Instructions    Non weight bearing    Complete by:  As directed   Laterality:  right  Extremity:  Lower            Medication List    STOP taking these medications        losartan 50 MG tablet  Commonly known as:  COZAAR     metolazone 2.5 MG tablet  Commonly  known as:  ZAROXOLYN      TAKE these medications        acetaminophen 325 MG tablet  Commonly known as:  TYLENOL  Take 325 mg by mouth every 6 (six) hours as needed for mild pain.     aspirin EC 81 MG tablet  Take 1 tablet (81 mg total) by mouth daily.     calcium citrate-vitamin D 315-200 MG-UNIT per tablet  Commonly known as:  CITRACAL+D  Take 1 tablet by mouth daily.     docusate sodium 100 MG capsule  Commonly known as:  COLACE  Take 100 mg by mouth 2 (two) times daily.     ferrous sulfate 325 (65 FE) MG tablet  Take 1 tablet (325 mg total) by mouth 2 (two) times daily with a meal.     furosemide 40 MG  tablet  Commonly known as:  LASIX  Take 1 tablet (40 mg total) by mouth 2 (two) times daily.  Start taking on:  02/04/2015     HYDROcodone-acetaminophen 5-325 MG per tablet  Commonly known as:  NORCO/VICODIN  Take 1-2 tablets by mouth every 4 (four) hours as needed (breakthrough pain).     insulin aspart 100 UNIT/ML FlexPen  Commonly known as:  NOVOLOG  Inject 3 Units into the skin 3 (three) times daily with meals.     Insulin Glargine 300 UNIT/ML Sopn  Commonly known as:  TOUJEO SOLOSTAR  Inject 20 Units into the skin daily.     Insulin Pen Needle 32G X 6 MM Misc  Commonly known as:  NOVOFINE  Use daily with insulin     JANUVIA 50 MG tablet  Generic drug:  sitaGLIPtin  Take 50 mg by mouth daily.     potassium chloride SA 20 MEQ tablet  Commonly known as:  K-DUR,KLOR-CON  Take 1 tablet (20 mEq total) by mouth daily.     simvastatin 20 MG tablet  Commonly known as:  ZOCOR  Take 1 tablet (20 mg total) by mouth every evening.       Allergies  Allergen Reactions  . Other Hives    STEROIDS  . Prednisone Hives and Other (See Comments)    She is allergic to all steroids!   Follow-up Information    Follow up with YATES,MARK C, MD In 2 weeks.   Specialty:  Orthopedic Surgery   Contact information:   [address] [place] [address] [phone]       Follow up with Arvilla Meres, MD On 02/09/2015.   Specialty:  Cardiology   Why:  at [address]   Contact information:   [address] [place] [address] [phone]       Follow up with Sanda Linger, MD. Schedule an appointment as soon as possible for a visit in 1 week.   Specialty:  Internal Medicine   Why:  Hospital follow up   Contact information:   [address] [place] [address] [phone]        The results of significant diagnostics from this hospitalization (including imaging, microbiology, ancillary and laboratory) are listed below for  reference.    Significant Diagnostic Studies: Dg Tibia/fibula Right  01/30/2015   CLINICAL DATA:  Fall today, injury, pain, swelling and distal right tibia/ankle.  EXAM: RIGHT TIBIA AND FIBULA - 2 VIEW  COMPARISON:  None.  FINDINGS: There is an oblique comminuted fracture through the distal shaft of the right fibula. Transverse fracture  at the base of the medial malleolus, better seen on the ankle series.  IMPRESSION: Mildly comminuted distal fibular shaft fracture. Transverse medial malleolar fracture.   Electronically Signed   By: Charlett Nose M.D.   On: 01/30/2015 13:43   Dg Ankle 2 Views Right  01/31/2015   CLINICAL DATA:  Internal fixation of right ankle fractures.  EXAM: DG C-ARM 61-120 MIN; RIGHT ANKLE - 2 VIEW  COMPARISON:  01/30/2015  FINDINGS: There is a long plate and multiple screws transfixing the distal fibular shaft fracture with anatomic reduction. There are 2 malleolar screws transfixing the medial malleolus fracture. No complicating features.  IMPRESSION: Internal fixation of ankle fractures with anatomic reduction.   Electronically Signed   By: Rudie Meyer M.D.   On: 01/31/2015 18:33   Dg Ankle Complete Right  01/30/2015   CLINICAL DATA:  Pain and swelling following fall  EXAM: RIGHT ANKLE - COMPLETE 3+ VIEW  COMPARISON:  None.  FINDINGS: Frontal, oblique, and lateral views obtained. There is a comminuted fracture of the medial malleolus with mildly displaced fracture fragments. There is an obliquely oriented fracture of the distal fibular diaphysis, comminuted. Major fracture fragments are in near anatomic alignment. The ankle mortise appears grossly intact. There is no appreciable joint effusion. There is osteoarthritic change with spurring in the dorsal midfoot region. There is a posterior calcaneal spur. There are multiple vascular calcifications.  IMPRESSION: Comminuted fractures of the medial malleolus and distal fibular diaphysis. Ankle mortise appears grossly intact.    Electronically Signed   By: Bretta Bang III M.D.   On: 01/30/2015 13:43   Dg C-arm 1-60 Min  01/31/2015   CLINICAL DATA:  Internal fixation of right ankle fractures.  EXAM: DG C-ARM 61-120 MIN; RIGHT ANKLE - 2 VIEW  COMPARISON:  01/30/2015  FINDINGS: There is a long plate and multiple screws transfixing the distal fibular shaft fracture with anatomic reduction. There are 2 malleolar screws transfixing the medial malleolus fracture. No complicating features.  IMPRESSION: Internal fixation of ankle fractures with anatomic reduction.   Electronically Signed   By: Rudie Meyer M.D.   On: 01/31/2015 18:33    Microbiology: Recent Results (from the past 240 hour(s))  Urine culture     Status: None   Collection Time: 01/30/15  2:44 PM  Result Value Ref Range Status   Specimen Description URINE, CLEAN CATCH  Final   Special Requests NONE  Final   Colony Count   Final    50,000 COLONIES/ML Performed at [facility]    Culture   Final    Multiple bacterial morphotypes present, none predominant. Suggest appropriate recollection if clinically indicated. Performed at [facility]    Report Status 01/31/2015 FINAL  Final  Surgical pcr screen     Status: None   Collection Time: 01/31/15  5:30 PM  Result Value Ref Range Status   MRSA, PCR NEGATIVE NEGATIVE Final   Staphylococcus aureus NEGATIVE NEGATIVE Final    Comment:        The Xpert SA Assay (FDA approved for NASAL specimens in patients over 60 years of age), is one component of a comprehensive surveillance program.  Test performance has been validated by [facility] for patients greater than or equal to 73 year old. It is not intended to diagnose infection nor to guide or monitor treatment.      Labs: Basic Metabolic Panel:  Recent Labs Lab 01/30/15 1344 01/30/15 2209 01/31/15 0338 02/01/15 0535 02/02/15 0455  NA  128* 137 136 140 135  K 4.1 3.2* 4.0 3.6 4.2  CL 89* 101 101 104 100  CO2 GLUCOSE 741* 93 93 114* 69*  BUN 45* 35* 37* 24* 38*  CREATININE 1.70* 1.45* 1.42* 1.20* 1.86*  CALCIUM 10.5 9.7 9.4 9.3 9.0   Liver Function Tests:  Recent Labs Lab 01/30/15 1344 01/31/15 0338  AST 21 23  ALT 20 16  ALKPHOS 46 35*  BILITOT 1.0 0.6  PROT 6.2 5.1*  ALBUMIN 3.8 3.1*   No results for input(s): LIPASE, AMYLASE in the last 168 hours. No results for input(s): AMMONIA in the last 168 hours. CBC:  Recent Labs Lab 01/30/15 1344 01/31/15 0338 02/01/15 0535 02/02/15 0455  WBC 9.0 11.2* 8.7 8.1  NEUTROABS 7.6  --   --   --   HGB 10.7* 9.7* 9.5* 8.8*  HCT 31.1* 27.9* 28.3* 26.7*  MCV 86.6 88.0 90.4 90.5  PLT 195 197 169 169   Cardiac Enzymes: No results for input(s): CKTOTAL, CKMB, CKMBINDEX, TROPONINI in the last 168 hours. BNP: BNP (last 3 results)  Recent Labs  12/13/14 1240  BNP 3083.2*    ProBNP (last 3 results) No results for input(s): PROBNP in the last 8760 hours.  CBG:  Recent Labs Lab 02/01/15 1835 02/01/15 1959 02/01/15 2222 02/02/15 0547 02/02/15 1105  GLUCAP 472* 406* 193* 90 258*    Signed:  Omarian Jaquith K  [facility] 02/02/2015, 12:09 PM

## 2015-02-02 NOTE — Progress Notes (Signed)
Inpatient Diabetes Program Recommendations  AACE/ADA: New Consensus Statement on Inpatient Glycemic Control (2013)  Target Ranges:  Prepandial:   less than 140 mg/dL      Peak postprandial:   less than 180 mg/dL (1-2 hours)      Critically ill patients:  140 - 180 mg/dL   Reason for Visit: Insulin Pen Administration   Results for [name], [name] (MRN [id number]) as of 02/02/2015 12:35  Ref. Range 02/01/2015 18:35 02/01/2015 19:59 02/01/2015 22:22 02/02/2015 05:47 02/02/2015 11:05  Glucose-Capillary Latest Ref Range: 70-99 mg/dL 472 (H) 406 (H) 193 (H) 90 258 (H)    To be discharged to [place]. Educated patient and daughter on insulin pen use at home. Reviewed contents of insulin starter kit. Reviewed all steps if insulin pen including attachment of needle, 2-unit air shot, dialing up dose, giving injection, removing needle, disposal of sharps, storage of unused insulin, disposal of insulin etc. Patient able to provide successful return demonstration. Also reviewed troubleshooting with insulin pen. MD to give patient Rxs for insulin pens and insulin pen needles. Discharge meds: Toujeo 20 units QD, Novolog 3 units tidwc, tradjenta 5 mg QD.  Discussed with MD and RN.  Thank you. Lorenda Peck, RD, LDN, CDE Inpatient Diabetes Coordinator [phone]

## 2015-02-02 NOTE — Clinical Documentation Improvement (Signed)
Presents with right bimalleolar fracture with ORIF performed.   Patient with history of chronic systolic and diastolic CHF  Described as volume overloaded in 4/14 progress note by Dr. Gala Romney  IV Lasix 40mg  initiated every 12 hours; will transition to PO when indicated  Please provide a diagnosis associated with the above clinical indicators and document findings in next progress note and include in discharge summary if applicable.   Acuity - Acute, Chronic, Acute on Chronic  Type - Systolic, Diastolic, Systolic and Diastolic, Other, Unable to Determine   Thank You, Shellee Milo ,RN Clinical Documentation Specialist:  [phone]  [facility]- Health Information Management

## 2015-02-02 NOTE — Progress Notes (Addendum)
CSW (Clinical Child psychotherapist) left voicemail for [facility] to notify of bed acceptance and plan for dc today. Awaiting facility return phone call to confirm they have a bed available. CSW did notify pt and pt daughter of potential for no bed availability. They asked that CSW notify them as soon as response is received from facility. CSW to provide alternate bed offers if needed.  ADDENDUM: CSW received call back from facility confirming that they can accept pt today. They are requesting pt dc paperwork and transport be completed as soon as possible.  Rishika Mccollom, LCSWA [phone]

## 2015-02-02 NOTE — Progress Notes (Addendum)
Advanced Heart Failure Rounding Note   Subjective:     Admitted after fall with fractured R ankle. Prior to admit she was to start insulin however she did not want perform insulin injections. Also has missed some medications. Had 4 falls recently. During that time she had increased thirst and urination. In the ED she hyperglycemic with glucose 741 and received IV fluids and started on insulin drip. Cardiac markers negative.   Post OP Day 2 for R ankle ORIF. Complaining of of RLE pain. Yesterday she received IV lasix. Creatinine bump noted.  Denies SOB   Objective:   Weight Range:  Vital Signs:   Temp:  [98.2 F (36.8 C)-98.9 F (37.2 C)] 98.4 F (36.9 C) (04/15 0851) Pulse Rate:  [77-107] 81 (04/15 0851) Resp:  [17-20] 17 (04/15 0851) BP: (87-115)/(46-68) 98/56 mmHg (04/15 0851) SpO2:  [86 %-100 %] 98 % (04/15 0851) Weight:  [127 lb 12.8 oz (57.97 kg)] 127 lb 12.8 oz (57.97 kg) (04/15 0500) Last BM Date: 01/31/15  Weight change: Filed Weights   01/30/15 1829 01/31/15 0510 02/02/15 0500  Weight: 119 lb 11.4 oz (54.3 kg) 120 lb 9.5 oz (54.7 kg) 127 lb 12.8 oz (57.97 kg)    Intake/Output:   Intake/Output Summary (Last 24 hours) at 02/02/15 0902 Last data filed at 02/02/15 0545  Gross per 24 hour  Intake   1020 ml  Output   1300 ml  Net   -280 ml    Physical Exam: General: Elderly frail appearing. No resp difficulty. In bed.  HEENT: normal Neck: supple. JVP 8-9 prominent CV waves Carotids 2+ bilat; no bruits. No lymphadenopathy or thryomegaly appreciated. Cor: PMI nondisplaced. Regular rate & rhythm. 2/6 SEM RSR. + s3 Lungs: Decreased in the bases.   Abdomen: soft, nontender, nondistended. No hepatosplenomegaly. No bruits or masses. Good bowel sounds. Extremities: no cyanosis, clubbing, rash, edema RLE dressing in place Neuro: alert & orientedx3, cranial nerves grossly intact. moves all 4 extremities w/o difficulty. Affect pleasant  Telemetry: SR  Labs: Basic  Metabolic Panel:  Recent Labs Lab 01/30/15 1344 01/30/15 2209 01/31/15 0338 02/01/15 0535 02/02/15 0455  NA 128* 137 136 140 135  K 4.1 3.2* 4.0 3.6 4.2  CL 89* 101 101 104 100  CO2 GLUCOSE 741* 93 93 114* 69*  BUN 45* 35* 37* 24* 38*  CREATININE 1.70* 1.45* 1.42* 1.20* 1.86*  CALCIUM 10.5 9.7 9.4 9.3 9.0    Liver Function Tests:  Recent Labs Lab 01/30/15 1344 01/31/15 0338  AST 21 23  ALT 20 16  ALKPHOS 46 35*  BILITOT 1.0 0.6  PROT 6.2 5.1*  ALBUMIN 3.8 3.1*   No results for input(s): LIPASE, AMYLASE in the last 168 hours. No results for input(s): AMMONIA in the last 168 hours.  CBC:  Recent Labs Lab 01/30/15 1344 01/31/15 0338 02/01/15 0535 02/02/15 0455  WBC 9.0 11.2* 8.7 8.1  NEUTROABS 7.6  --   --   --   HGB 10.7* 9.7* 9.5* 8.8*  HCT 31.1* 27.9* 28.3* 26.7*  MCV 86.6 88.0 90.4 90.5  PLT 195 197 169 169    Cardiac Enzymes: No results for input(s): CKTOTAL, CKMB, CKMBINDEX, TROPONINI in the last 168 hours.  BNP: BNP (last 3 results)  Recent Labs  12/13/14 1240  BNP 3083.2*    ProBNP (last 3 results) No results for input(s): PROBNP in the last 8760 hours.    Other results:  Imaging: Dg Ankle 2 Views Right  01/31/2015   CLINICAL DATA:  Internal fixation of right ankle fractures.  EXAM: DG C-ARM 61-120 MIN; RIGHT ANKLE - 2 VIEW  COMPARISON:  01/30/2015  FINDINGS: There is a long plate and multiple screws transfixing the distal fibular shaft fracture with anatomic reduction. There are 2 malleolar screws transfixing the medial malleolus fracture. No complicating features.  IMPRESSION: Internal fixation of ankle fractures with anatomic reduction.   Electronically Signed   By: Rudie Meyer M.D.   On: 01/31/2015 18:33   Dg C-arm 1-60 Min  01/31/2015   CLINICAL DATA:  Internal fixation of right ankle fractures.  EXAM: DG C-ARM 61-120 MIN; RIGHT ANKLE - 2 VIEW  COMPARISON:  01/30/2015  FINDINGS: There is a long plate and multiple  screws transfixing the distal fibular shaft fracture with anatomic reduction. There are 2 malleolar screws transfixing the medial malleolus fracture. No complicating features.  IMPRESSION: Internal fixation of ankle fractures with anatomic reduction.   Electronically Signed   By: Rudie Meyer M.D.   On: 01/31/2015 18:33     Medications:     Scheduled Medications: . aspirin EC  81 mg Oral Daily  . docusate sodium  100 mg Oral BID  . enoxaparin (LOVENOX) injection  30 mg Subcutaneous Q24H  . feeding supplement (GLUCERNA SHAKE)  237 mL Oral Q24H  . ferrous sulfate  325 mg Oral BID WC  . insulin aspart  0-9 Units Subcutaneous TID WC  . insulin glargine  30 Units Subcutaneous QHS  . linagliptin  5 mg Oral Daily  . potassium chloride  20 mEq Oral Daily  . simvastatin  20 mg Oral QPM  . sodium chloride  3 mL Intravenous Q12H    Infusions: . lactated ringers 10 mL/hr at 01/31/15 1707    PRN Medications: acetaminophen **OR** acetaminophen, dextrose, feeding supplement (GLUCERNA SHAKE), HYDROcodone-acetaminophen, meperidine (DEMEROL) injection, metoCLOPramide **OR** metoCLOPramide (REGLAN) injection, morphine injection, ondansetron **OR** ondansetron (ZOFRAN) IV   Assessment:  1. Uncontrolled DM-  2. R fractured Ankle- Fall ---> S/P 4/13 ORIF R ankle 3. Chronic Systolic Heart Failure 4. Carotic Stenosis 5. S/P AVR/MVR 6. Hyperlipidemia 7. H/O Post op AF 8. Falls   Plan/Discussion:    POD #2 ORIF R ankle  Creatinine bump noted today. 1.2>1.8 Hold diuretics for 1 day then resume lasix po 40 mg twice a day. No BB or ARB with hypotension.   Plan for SNF per primary team.  Ok to go today. She has follow up next Friday in the HF clinic. We will check BMET at that time.   HF D/C meds  Lasix 40 mg twice a day restart 02/03/2015 Kdur 20 meq daily.   Length of Stay: 3  CLEGG,AMY NP-C  02/02/2015, 9:02 AM  Advanced Heart Failure Team Pager [phone] (M-F; 7a - 4p)  Please  contact [facility] for night-coverage after hours (4p -7a ) and weekends on [email or web address]   Patient seen and examined with Tonye Becket, NP. We discussed all aspects of the encounter. I agree with the assessment and plan as stated above.   She is dry. Agree with holding lasix. BP too soft to restart losartan at this time. Will try to restart when we see her back in clinic. Has not tolerated b-blocker in recent past.   Truman Hayward 12:27 PM

## 2015-02-02 NOTE — Progress Notes (Signed)
Subjective: 2 Days Post-Op Procedure(s) (LRB): OPEN REDUCTION INTERNAL FIXATION (ORIF) ANKLE FRACTURE (Right) Patient reports pain as mild.    Objective: Vital signs in last 24 hours: Temp:  [98.2 F (36.8 C)-98.9 F (37.2 C)] 98.4 F (36.9 C) (04/15 0851) Pulse Rate:  [77-107] 81 (04/15 0851) Resp:  [17-20] 17 (04/15 0851) BP: (87-115)/(46-68) 98/56 mmHg (04/15 0851) SpO2:  [86 %-100 %] 98 % (04/15 0851) Weight:  [57.97 kg (127 lb 12.8 oz)] 57.97 kg (127 lb 12.8 oz) (04/15 0500)  Intake/Output from previous day: 04/14 0701 - 04/15 0700 In: 1040 [P.O.:920; I.V.:120] Out: 2050 [Urine:2050] Intake/Output this shift:     Recent Labs  01/30/15 1344 01/31/15 0338 02/01/15 0535 02/02/15 0455  HGB 10.7* 9.7* 9.5* 8.8*    Recent Labs  02/01/15 0535 02/02/15 0455  WBC 8.7 8.1  RBC 3.13* 2.95*  HCT 28.3* 26.7*  PLT 169 169    Recent Labs  02/01/15 0535 02/02/15 0455  NA 140 135  K 3.6 4.2  CL 104 100  CO2 28 26  BUN 24* 38*  CREATININE 1.20* 1.86*  GLUCOSE 114* 69*  CALCIUM 9.3 9.0   No results for input(s): LABPT, INR in the last 72 hours.  Neurologically intact  Assessment/Plan: 2 Days Post-Op Procedure(s) (LRB): OPEN REDUCTION INTERNAL FIXATION (ORIF) ANKLE FRACTURE (Right) Discharge to SNF,  My   office 2 wks  .   I will place in followup discharge section. thanks  Elbony Mcclimans C 02/02/2015, 9:08 AM

## 2015-02-02 NOTE — Progress Notes (Deleted)
SATURATION QUALIFICATIONS: (This note is used to comply with regulatory documentation for home oxygen)  Patient Saturations on Room Air at Rest =  93%  Patient Saturations on Room Air while Ambulating = 79*%  Patient Saturations on 2 Liters of oxygen while Ambulating= 94*%  Please briefly explain why patient needs home oxygen: 

## 2015-02-02 NOTE — Progress Notes (Signed)
Report called to Eugenio Hoes, LPN receiving nurse at [facility] All questions answered, Awaiting [facility] to transfer patient out.

## 2015-02-07 ENCOUNTER — Telehealth (HOSPITAL_COMMUNITY): Payer: Self-pay | Admitting: Vascular Surgery

## 2015-02-07 NOTE — Telephone Encounter (Signed)
Ok to reschedule for 3-4 weeks per Amy Clegg NP-C.

## 2015-02-07 NOTE — Telephone Encounter (Signed)
Pt daughter called pt broke her ankle had surgery and is now in rehab, daughter wants to know how important is the appt on Friday and if they can reschedule for at least 4 weeks until she could put some weight on her ankle.Marland Kitchen Please advise

## 2015-02-09 ENCOUNTER — Encounter (HOSPITAL_COMMUNITY): Payer: Medicare Other

## 2015-02-13 ENCOUNTER — Inpatient Hospital Stay: Payer: Medicare Other | Admitting: Internal Medicine

## 2015-02-21 ENCOUNTER — Ambulatory Visit (HOSPITAL_COMMUNITY)
Admission: RE | Admit: 2015-02-21 | Discharge: 2015-02-21 | Disposition: A | Discharge status: Home / Self Care | Payer: Medicare Other | Source: Ambulatory Visit | Attending: Cardiology | Admitting: Cardiology

## 2015-02-21 ENCOUNTER — Telehealth (HOSPITAL_COMMUNITY): Payer: Self-pay | Admitting: Vascular Surgery

## 2015-02-21 ENCOUNTER — Encounter (HOSPITAL_COMMUNITY): Payer: Self-pay

## 2015-02-21 VITALS — BP 112/70 | HR 100 | Wt 151.4 lb

## 2015-02-21 DIAGNOSIS — Z953 Presence of xenogenic heart valve: Secondary | ICD-10-CM

## 2015-02-21 DIAGNOSIS — Z954 Presence of other heart-valve replacement: Secondary | ICD-10-CM | POA: Insufficient documentation

## 2015-02-21 DIAGNOSIS — I5043 Acute on chronic combined systolic (congestive) and diastolic (congestive) heart failure: Secondary | ICD-10-CM

## 2015-02-21 DIAGNOSIS — I480 Paroxysmal atrial fibrillation: Secondary | ICD-10-CM | POA: Insufficient documentation

## 2015-02-21 DIAGNOSIS — N183 Chronic kidney disease, stage 3 unspecified: Secondary | ICD-10-CM

## 2015-02-21 DIAGNOSIS — I5033 Acute on chronic diastolic (congestive) heart failure: Secondary | ICD-10-CM | POA: Diagnosis not present

## 2015-02-21 DIAGNOSIS — I5022 Chronic systolic (congestive) heart failure: Secondary | ICD-10-CM

## 2015-02-21 LAB — CBC
HCT: 35.9 % — ABNORMAL LOW (ref 36.0–46.0)
Hemoglobin: 11.5 g/dL — ABNORMAL LOW (ref 12.0–15.0)
MCH: 30.5 pg (ref 26.0–34.0)
MCHC: 32 g/dL (ref 30.0–36.0)
MCV: 95.2 fL (ref 78.0–100.0)
PLATELETS: 281 10*3/uL (ref 150–400)
RBC: 3.77 MIL/uL — ABNORMAL LOW (ref 3.87–5.11)
RDW: 17.7 % — ABNORMAL HIGH (ref 11.5–15.5)
WBC: 8.1 10*3/uL (ref 4.0–10.5)

## 2015-02-21 LAB — BASIC METABOLIC PANEL
Anion gap: 12 (ref 5–15)
BUN: 50 mg/dL — AB (ref 6–20)
CHLORIDE: 104 mmol/L (ref 101–111)
CO2: 23 mmol/L (ref 22–32)
Calcium: 9 mg/dL (ref 8.9–10.3)
Creatinine, Ser: 1.68 mg/dL — ABNORMAL HIGH (ref 0.44–1.00)
GFR calc Af Amer: 32 mL/min — ABNORMAL LOW (ref >60)
GFR, EST NON AFRICAN AMERICAN: 27 mL/min — AB (ref >60)
GLUCOSE: 96 mg/dL (ref 70–99)
POTASSIUM: 4 mmol/L (ref 3.5–5.1)
Sodium: 139 mmol/L (ref 135–145)

## 2015-02-21 LAB — BRAIN NATRIURETIC PEPTIDE: B Natriuretic Peptide: 4039.6 pg/mL — ABNORMAL HIGH (ref 0.0–100.0)

## 2015-02-21 MED ORDER — METOLAZONE 2.5 MG PO TABS: ORAL_TABLET | ORAL | Status: DC

## 2015-02-21 MED ORDER — POTASSIUM CHLORIDE CRYS ER 20 MEQ PO TBCR: 40.0000 meq | EXTENDED_RELEASE_TABLET | Freq: Two times a day (BID) | ORAL | Status: DC

## 2015-02-21 MED ORDER — POTASSIUM CHLORIDE CRYS ER 20 MEQ PO TBCR: 40.0000 meq | EXTENDED_RELEASE_TABLET | Freq: Every day | ORAL | Status: DC

## 2015-02-21 MED ORDER — FUROSEMIDE 40 MG PO TABS: 60.0000 mg | ORAL_TABLET | Freq: Two times a day (BID) | ORAL | Status: DC

## 2015-02-21 NOTE — Telephone Encounter (Signed)
Spoke with pts daughter pt is up 20lbs in 3weeks, SOB, and has swelling in her stomach.  Pt is at [facility] (for broken ankle) they are increasing her lasix from 40 bid to 80 bid today.  Wants to know if we want to make any changes pt has an appt scheduled with [place] 02/28/15  Per Dr.McLean pt needs an appt this week and to continue with higher dose of lasix until she is seen her in office.  Tried to call daughter (no answer) Ines Bloomer will schedule appt and advise pt daughter.

## 2015-02-21 NOTE — Telephone Encounter (Signed)
Pt daughter called pt is in a nursing home daughter states she put on about 20 lbs of fluid .Marland Kitchen Please advise

## 2015-02-21 NOTE — Progress Notes (Signed)
Patient ID: Jo[name], female   DOB: 05-09-1930, 81 y.o.   MRN: [id number] PCP: Dr Yetta Barre ([facility] on [place]) CVTS Surgeon: Dr. Cornelius Moras Primary Cardiologist: Dr. Gala Romney  HPI: Joanna [name] is a 81 year old woman with aortic stenosis s/p AVR/mitral valve replacement (06/15/2013), systolic HF due to NICM, hypertension, diabetes mellitus type II, hyperlipidemia, and peripheral artery disease. Underwent AVR/MVR with placement of epicardial lead on 06/15/13 with PAF post-op terminated with amiodarone.  She is off amiodarone now due to nausea..   Admitted initially in 09/2011 with acute systolic HF, EF 30%. Cath with normal coronaries and normal cardiac output (see below). AS was mild to moderate. In hospital also found to be iron-deficiency anemia. Previous colonoscopies normal. Seen by GI who suggested trial of iron and see if it improved.  05/19/2013 ECHO EF 35% severe AS (low gradient severe AS confirmed by DSE), gradient: 32 mm Hg (S). Peak gradient: 57mm Hg  08/18/13 ECHO EF 45-50% AV working well 2/16 ECHO with EF 20-25%, moderately dilated LV with mild LVH, normal bioprosthetic mitral and aortic valves, moderate TR, PA systolic pressure 54 mmHg.  4/16 ECHO EF 20-25%, diffuse hypokinesis, mild LVH, bioprosthetic aortic valve functioning normally, bioprosthetic mitral valve with mild central MR and mean gradient 4 mmHg.   Had 30 day event monitor in 1/15 without recurrent AF.   Admitted to [facility] 12/15/14 with volume overload. Diuresed with IV lasix and transitioned to lasix 40 mg twice a day. Discharge weight 128 pounds. She went home 2/28 and fell at home. She returned to ED with negative CT scan.   Larey Seat in setting of hyperglycemia in 4/16 and fractured right ankle, admitted and had ORIF right ankle. Hospitalization complicated by volume overload.  [name]    Her weight has been trending up recently.  Last visit here it was 129 lbs and now 151 lbs (she has a cast on now, but still she has  gained significant fluid weight).  She has been taking Lasix 40 mg bid, no metolazone at home.  She is short of breath doing her chair exercises and transferring from chair to bed or bed to chair.  She has had worsening orthopnea, has to prop up to > 45 degree angle. No chest pain.  No lightheadedness or palpitations.  She is off losartan with elevated creatinine and she has not tolerated Coreg.   Labs 9/14: K 5.4, creatinine 1.69 09/2013: K+ 4.6, creatinine 1.08, pro-BNP 11643 10/17/13: K+ 4.4, creatinine 1.6 11/28/14: K+ 4.1, Creatinine 1.6 12/15/14: K 3.5, creatinine 1.72 4/16: K 4.2, creatinine 1.86, hgb 8.8  ROS:  12-point review of systems went over in clinic and all negative except as described in HPI and problem list.  Past Medical History  Diagnosis Date  . Peripheral artery disease   . Hyperlipidemia     takes Simvastatin daily  . PONV (postoperative nausea and vomiting)   . Shortness of breath   . Arthritis   . Aortic stenosis 09/21/2011  . AORTIC VALVE SCLEROSIS 02/21/2010  . 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 BUNDLE BRANCH BLOCK, LEFT 07/30/2007  . CAROTID ARTERY STENOSIS 01/05/2009  . CVA 03/14/2010  . 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 PERIPHERAL VASCULAR DISEASE 07/30/2007  . POLYPECTOMY, HX OF 07/30/2007  . Constipation   . Chronic combined systolic and diastolic CHF (congestive heart failure)     a. RHC 09/2011 RA 8, RV 58/2/11; PA 54/22 (37) PCWP 20; F CO/CI 4.57/2.67 PVR 3.7; b. L main no sig  dz, LAD luminal irreg; LCx lg Ramus with luminal irreg, small AV Lcx witout sig dz, RCA luminal irreg; severe mitral annular calcifications; AV calcified c. EF 45-50% (07/2013);  d. 11/2014 Echo: EF 20-25%, diff HK, nl AV/MV, sev dil LA, mod TR/PR, PASP .  . Diabetes mellitus   . DM 07/30/2007  . HTN (hypertension)   . HYPERTENSION 07/30/2007  . Weakness   . Dizziness   . Joint pain   . Itching   . Anemia   . Iron deficiency anemia 09/21/2011  . Nocturia   . History of  blood transfusion   . History of shingles   . Mitral regurgitation   . S/P aortic valve replacement with bioprosthetic valve 06/15/2013    21 mm [facility] Ease bovine pericardial tissue valve  . S/P mitral valve replacement with bioprosthetic valve 06/15/2013    25 mm [facility] Mitral bovine pericardial tissue valve  . Fracture, fibula, shaft 01/30/2015    right     Current Outpatient Prescriptions  Medication Sig Dispense Refill  . acetaminophen (TYLENOL) 325 MG tablet Take 325 mg by mouth every 6 (six) hours as needed for mild pain.    Marland Kitchen aspirin EC 81 MG tablet Take 1 tablet (81 mg total) by mouth daily. 90 tablet 3  . calcium citrate-vitamin D (CITRACAL+D) 315-200 MG-UNIT per tablet Take 1 tablet by mouth daily.    Marland Kitchen docusate sodium (COLACE) 100 MG capsule Take 100 mg by mouth 2 (two) times daily.      . ferrous sulfate 325 (65 FE) MG tablet Take 1 tablet (325 mg total) by mouth 2 (two) times daily with a meal. 180 tablet 1  . furosemide (LASIX) 40 MG tablet Take 1.5 tablets (60 mg total) by mouth 2 (two) times daily. Please take Lasix 80 mg twice daily for Wed evening, Thursday and Friday--then begin 60 mg twice daily on Saturday May 7th 2016 60 tablet 6  . furosemide (LASIX) 80 MG tablet Inject 80 mg into the muscle.    Marland Kitchen HYDROcodone-acetaminophen (NORCO/VICODIN) 5-325 MG per tablet Take 1-2 tablets by mouth every 4 (four) hours as needed (breakthrough pain). 30 tablet 0  . insulin aspart (NOVOLOG) 100 UNIT/ML FlexPen Inject 3 Units into the skin 3 (three) times daily with meals. 15 mL 0  . Insulin Glargine (TOUJEO SOLOSTAR) 300 UNIT/ML SOPN Inject 20 Units into the skin daily. 4.5 mL 0  . Insulin Pen Needle (NOVOFINE) 32G X 6 MM MISC Use daily with insulin 300 each 3  . JANUVIA 50 MG tablet Take 50 mg by mouth daily.    . potassium chloride SA (K-DUR,KLOR-CON) 20 MEQ tablet Take 2 tablets (40 mEq total) by mouth daily. Please take 40 mg twice daily except On days that you take  Metolazone take 60 mg twice daily 90 tablet 3  . simvastatin (ZOCOR) 20 MG tablet Take 1 tablet (20 mg total) by mouth every evening. 90 tablet 3  . metolazone (ZAROXOLYN) 2.5 MG tablet Please take Metolazone 30 minutes before am Lasix dose on Tuesdays and Fridays 90 tablet 3   No current facility-administered medications for this encounter.   History   Social History  . Marital Status: Divorced    Spouse Name: N/A  . Number of Children: N/A  . Years of Education: N/A   Social History Main Topics  . Smoking status: Never Smoker   . Smokeless tobacco: Never Used  . Alcohol Use: No  . Drug Use: No  . Sexual  Activity: No   Other Topics Concern  . None   Social History Narrative   Patient lives alone here in town   She continues to work, helping to bind and oversew books   She is divorced after 23 years of marriage   1 son- '61, minister; 1 dtr '55; 4 grandchildren   End of life care-provided packet on living well   Family History  Problem Relation Age of Onset  . Colon cancer Mother   . Diabetes Neg Hx   . Coronary artery disease Neg Hx     Filed Vitals:   02/21/15 1021  BP: 112/70  Pulse: 100  Weight: 151 lb 6.4 oz (68.675 kg)  SpO2: 99%    PHYSICAL EXAM: General:  Elderly. NAD; daughter and grandson present HEENT: normal Neck: supple. JVP 14-16 cm. Carotids 2+ bilaterally; no bruits. No lymphadenopathy or thryomegaly appreciated. Cor: PMI normal. Regular rate & rhythm. 1/6 SEM. + S3   Lungs: CTA  Abdomen: soft, nontender, nondistended. No hepatosplenomegaly. No bruits or masses. Good bowel sounds. Extremities: no cyanosis, clubbing, rash.  Cast on right lower leg, 1+ edema 3/4 to knee on left.  Neuro: alert & orientedx3, cranial nerves grossly intact. Moves all 4 extremities w/o difficulty. Affect pleasant  ASSESSMENT & PLAN: 1. Chronic systolic CHF: EF back down to 32-44% (4/16).  Nonischemic cardiomyopathy.  NYHA class IIIb symptoms with significant volume  overload.  She got IM Lasix at [facility] today and feels some better.  Weight is up a lot.  - She will increase Lasix to 80 mg bid today, Thursday, and Friday.  She will take metolazone 2.5 mg prior to am Lasix on Thursday and Friday.  Starting Saturday, she will take Lasix 60 mg bid and will take metolazone every Tuesday and Friday.   - She will increase daily KCl to 40 mEq daily and will take 60 mEq on metolazone days.  - No losartan for now with elevated creatinine.  She did not tolerate Coreg in the past and would not start beta blocker with marked volume overload.   - ECG shows wide LBBB (152 msec).  If EF does not recovery, CRT could be considered.  2. Carotid stenosis: 40-59% RICA stenosis, repeat carotids in 3/17.  3. Hyperlipidemia: LDL 93 in 3/16, would not change statin.  4. Aortic stenosis/mitral regurgitation: s/p bioprosthetic AVR/MVR.  Well-seated valves on recent echo 5. CKD: BMET today and repeat at followup in 1 week.  6. Post-op AF:  No evidence of recurrence.   Followup in 1 week.   Mickey Esguerra,MD 02/21/2015

## 2015-02-21 NOTE — Patient Instructions (Signed)
Please Increase Lasix 80 mg twice daily for tonite, Thursday and Friday--then Saturday decrease dose to 60 mg Twice daily Begin Metolazone 30 minutes before AM Lasix dose Thursday and Friday--then take only on Tuesdays and Fridays Increase Potassium to 40 meq daily except on days she takes Metolazone-- On those days take 60 daily--(Tuesdays and Fridays) Follow-up on Monday Labs today

## 2015-02-26 ENCOUNTER — Ambulatory Visit (HOSPITAL_COMMUNITY)
Admission: RE | Admit: 2015-02-26 | Discharge: 2015-02-26 | Disposition: A | Discharge status: Home / Self Care | Payer: No Typology Code available for payment source | Source: Ambulatory Visit | Attending: Cardiology | Admitting: Cardiology

## 2015-02-26 VITALS — BP 100/52 | HR 102 | Wt 137.6 lb

## 2015-02-26 DIAGNOSIS — N183 Chronic kidney disease, stage 3 unspecified: Secondary | ICD-10-CM

## 2015-02-26 DIAGNOSIS — Z79899 Other long term (current) drug therapy: Secondary | ICD-10-CM | POA: Diagnosis not present

## 2015-02-26 DIAGNOSIS — N189 Chronic kidney disease, unspecified: Secondary | ICD-10-CM | POA: Diagnosis not present

## 2015-02-26 DIAGNOSIS — Z953 Presence of xenogenic heart valve: Secondary | ICD-10-CM | POA: Diagnosis not present

## 2015-02-26 DIAGNOSIS — I429 Cardiomyopathy, unspecified: Secondary | ICD-10-CM | POA: Insufficient documentation

## 2015-02-26 DIAGNOSIS — I5022 Chronic systolic (congestive) heart failure: Secondary | ICD-10-CM

## 2015-02-26 DIAGNOSIS — I350 Nonrheumatic aortic (valve) stenosis: Secondary | ICD-10-CM | POA: Diagnosis not present

## 2015-02-26 DIAGNOSIS — Z794 Long term (current) use of insulin: Secondary | ICD-10-CM | POA: Insufficient documentation

## 2015-02-26 DIAGNOSIS — I739 Peripheral vascular disease, unspecified: Secondary | ICD-10-CM | POA: Insufficient documentation

## 2015-02-26 DIAGNOSIS — I340 Nonrheumatic mitral (valve) insufficiency: Secondary | ICD-10-CM | POA: Insufficient documentation

## 2015-02-26 DIAGNOSIS — I129 Hypertensive chronic kidney disease with stage 1 through stage 4 chronic kidney disease, or unspecified chronic kidney disease: Secondary | ICD-10-CM | POA: Diagnosis not present

## 2015-02-26 DIAGNOSIS — E785 Hyperlipidemia, unspecified: Secondary | ICD-10-CM | POA: Insufficient documentation

## 2015-02-26 DIAGNOSIS — I6521 Occlusion and stenosis of right carotid artery: Secondary | ICD-10-CM | POA: Insufficient documentation

## 2015-02-26 DIAGNOSIS — I447 Left bundle-branch block, unspecified: Secondary | ICD-10-CM | POA: Diagnosis not present

## 2015-02-26 DIAGNOSIS — I480 Paroxysmal atrial fibrillation: Secondary | ICD-10-CM | POA: Diagnosis not present

## 2015-02-26 DIAGNOSIS — I5032 Chronic diastolic (congestive) heart failure: Secondary | ICD-10-CM

## 2015-02-26 DIAGNOSIS — Z952 Presence of prosthetic heart valve: Secondary | ICD-10-CM | POA: Insufficient documentation

## 2015-02-26 DIAGNOSIS — Z7982 Long term (current) use of aspirin: Secondary | ICD-10-CM | POA: Insufficient documentation

## 2015-02-26 DIAGNOSIS — I440 Atrioventricular block, first degree: Secondary | ICD-10-CM | POA: Insufficient documentation

## 2015-02-26 DIAGNOSIS — E119 Type 2 diabetes mellitus without complications: Secondary | ICD-10-CM | POA: Insufficient documentation

## 2015-02-26 DIAGNOSIS — D509 Iron deficiency anemia, unspecified: Secondary | ICD-10-CM | POA: Insufficient documentation

## 2015-02-26 LAB — BASIC METABOLIC PANEL
Anion gap: 10 (ref 5–15)
BUN: 46 mg/dL — ABNORMAL HIGH (ref 6–20)
CO2: 31 mmol/L (ref 22–32)
Calcium: 9.3 mg/dL (ref 8.9–10.3)
Chloride: 97 mmol/L — ABNORMAL LOW (ref 101–111)
Creatinine, Ser: 1.61 mg/dL — ABNORMAL HIGH (ref 0.44–1.00)
GFR, EST AFRICAN AMERICAN: 33 mL/min — AB (ref >60)
GFR, EST NON AFRICAN AMERICAN: 29 mL/min — AB (ref >60)
GLUCOSE: 119 mg/dL — AB (ref 70–99)
Potassium: 3.1 mmol/L — ABNORMAL LOW (ref 3.5–5.1)
SODIUM: 138 mmol/L (ref 135–145)

## 2015-02-26 LAB — BRAIN NATRIURETIC PEPTIDE: B Natriuretic Peptide: 4185.3 pg/mL — ABNORMAL HIGH (ref 0.0–100.0)

## 2015-02-26 NOTE — Patient Instructions (Signed)
Follow up 2 weeks  Do the following things EVERYDAY: 1) Weigh yourself in the morning before breakfast. Write it down and keep it in a log. 2) Take your medicines as prescribed 3) Eat low salt foods-Limit salt (sodium) to 2000 mg per day.  4) Stay as active as you can everyday 5) Limit all fluids for the day to less than 2 liters 

## 2015-02-26 NOTE — Progress Notes (Signed)
Pt in for f/u appt today, as she stood up to weight a screw on the side of her wheelchair scraped her left leg caused a small cut.  Pressure was applied to stop the bleeding and wound was evaluated by Tonye Becket, NP who ordered steri strips.  Steri strips were applied and bleeding has stopped, pt reports no pain from injury.  Daughter is with pt and aware of the incident.

## 2015-02-27 NOTE — Progress Notes (Signed)
Patient ID: [name], female   DOB: 1930/04/26, 81 y.o.   MRN: [id number] PCP: Dr Yetta Barre ([facility] on [place]) CVTS Surgeon: Dr. Cornelius Moras Primary Cardiologist: Dr. Gala Romney  HPI: [name] is a 81 year old woman with aortic stenosis s/p AVR/mitral valve replacement (06/15/2013), systolic HF due to NICM, hypertension, diabetes mellitus type II, hyperlipidemia, and peripheral artery disease. Underwent AVR/MVR with placement of epicardial lead on 06/15/13 with PAF post-op terminated with amiodarone.  She is off amiodarone now due to nausea..   Admitted initially in 09/2011 with acute systolic HF, EF 30%. Cath with normal coronaries and normal cardiac output (see below). AS was mild to moderate. In hospital also found to be iron-deficiency anemia. Previous colonoscopies normal. Seen by GI who suggested trial of iron and see if it improved.  05/19/2013 ECHO EF 35% severe AS (low gradient severe AS confirmed by DSE), gradient: 32 mm Hg (S). Peak gradient: 57mm Hg  08/18/13 ECHO EF 45-50% AV working well 2/16 ECHO with EF 20-25%, moderately dilated LV with mild LVH, normal bioprosthetic mitral and aortic valves, moderate TR, PA systolic pressure 54 mmHg.  4/16 ECHO EF 20-25%, diffuse hypokinesis, mild LVH, bioprosthetic aortic valve functioning normally, bioprosthetic mitral valve with mild central MR and mean gradient 4 mmHg.   Had 30 day event monitor in 1/15 without recurrent AF.   Admitted to [facility] 12/15/14 with volume overload. Diuresed with IV lasix and transitioned to lasix 40 mg twice a day. Discharge weight 128 pounds. She went home 2/28 and fell at home. She returned to ED with negative CT scan.   Larey Seat in setting of hyperglycemia in 4/16 and fractured right ankle, admitted and had ORIF right ankle. Hospitalization complicated by volume overload.  Now at [facility] nursing home.    At last appointment, weight was up considerably.  She was short of breath doing her chair exercises and transferring from chair to  bed or bed to chair.  She had worsening orthopnea, had to prop up to > 45 degree angle. No chest pain.  No lightheadedness or palpitations.    At last appointment, Lasix was increased and metolazone added in a scheduled fashion.  She has lost 14 lbs since last appointment and is feeling better.  She has minimal dyspnea now with transfers.  Less orthopnea.  She did have an episode last night that sounds like PND.    She is off losartan with elevated creatinine and she has not tolerated Coreg.   Labs 9/14: K 5.4, creatinine 1.69 09/2013: K+ 4.6, creatinine 1.08, pro-BNP 11643 10/17/13: K+ 4.4, creatinine 1.6 11/28/14: K+ 4.1, Creatinine 1.6 12/15/14: K 3.5, creatinine 1.72 4/16: K 4.2, creatinine 1.86, hgb 8.8 5/16: K 4, creatinine 1.68, BNP 4039  ECG: NSR, 1st degree AV block, LBBB  ROS:  12-point review of systems went over in clinic and all negative except as described in HPI and problem list.  Past Medical History  Diagnosis Date  . Peripheral artery disease   . Hyperlipidemia     takes Simvastatin daily  . PONV (postoperative nausea and vomiting)   . Shortness of breath   . Arthritis   . Aortic stenosis 09/21/2011  . AORTIC VALVE SCLEROSIS 02/21/2010  . 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 BUNDLE BRANCH BLOCK, LEFT 07/30/2007  . CAROTID ARTERY STENOSIS 01/05/2009  . CVA 03/14/2010  . 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 PERIPHERAL VASCULAR DISEASE 07/30/2007  . POLYPECTOMY, HX OF 07/30/2007  . Constipation   . Chronic combined systolic and diastolic CHF (  congestive heart failure)     a. RHC 09/2011 RA 8, RV 58/2/11; PA 54/22 (37) PCWP 20; F CO/CI 4.57/2.67 PVR 3.7; b. L main no sig dz, LAD luminal irreg; LCx lg Ramus with luminal irreg, small AV Lcx witout sig dz, RCA luminal irreg; severe mitral annular calcifications; AV calcified c. EF 45-50% (07/2013);  d. 11/2014 Echo: EF 20-25%, diff HK, nl AV/MV, sev dil LA, mod TR/PR, PASP .  . Diabetes mellitus   . DM 07/30/2007  . HTN  (hypertension)   . HYPERTENSION 07/30/2007  . Weakness   . Dizziness   . Joint pain   . Itching   . Anemia   . Iron deficiency anemia 09/21/2011  . Nocturia   . History of blood transfusion   . History of shingles   . Mitral regurgitation   . S/P aortic valve replacement with bioprosthetic valve 06/15/2013    21 mm [facility] Ease bovine pericardial tissue valve  . S/P mitral valve replacement with bioprosthetic valve 06/15/2013    25 mm [facility] Mitral bovine pericardial tissue valve  . Fracture, fibula, shaft 01/30/2015    right     Current Outpatient Prescriptions  Medication Sig Dispense Refill  . acetaminophen (TYLENOL) 325 MG tablet Take 325 mg by mouth every 6 (six) hours as needed for mild pain.    Marland Kitchen aspirin EC 81 MG tablet Take 1 tablet (81 mg total) by mouth daily. 90 tablet 3  . calcium citrate-vitamin D (CITRACAL+D) 315-200 MG-UNIT per tablet Take 1 tablet by mouth daily.    Marland Kitchen docusate sodium (COLACE) 100 MG capsule Take 100 mg by mouth 2 (two) times daily.      . ferrous sulfate 325 (65 FE) MG tablet Take 1 tablet (325 mg total) by mouth 2 (two) times daily with a meal. 180 tablet 1  . furosemide (LASIX) 40 MG tablet Take 60 mg by mouth 2 (two) times daily.    Marland Kitchen HYDROcodone-acetaminophen (NORCO/VICODIN) 5-325 MG per tablet Take 1-2 tablets by mouth every 4 (four) hours as needed (breakthrough pain). 30 tablet 0  . insulin aspart (NOVOLOG) 100 UNIT/ML FlexPen Inject 3 Units into the skin 3 (three) times daily with meals. 15 mL 0  . Insulin Glargine (TOUJEO SOLOSTAR) 300 UNIT/ML SOPN Inject 20 Units into the skin daily. 4.5 mL 0  . Insulin Pen Needle (NOVOFINE) 32G X 6 MM MISC Use daily with insulin 300 each 3  . JANUVIA 50 MG tablet Take 50 mg by mouth daily.    . metolazone (ZAROXOLYN) 2.5 MG tablet Please take Metolazone 30 minutes before am Lasix dose on Tuesdays and Fridays 90 tablet 3  . potassium chloride SA (K-DUR,KLOR-CON) 20 MEQ tablet Take 40 mEq by mouth  daily. Take extra tab on Tue and Fri with Metolazone    . simvastatin (ZOCOR) 20 MG tablet Take 1 tablet (20 mg total) by mouth every evening. 90 tablet 3   No current facility-administered medications for this encounter.   History   Social History  . Marital Status: Divorced    Spouse Name: N/A  . Number of Children: N/A  . Years of Education: N/A   Social History Main Topics  . Smoking status: Never Smoker   . Smokeless tobacco: Never Used  . Alcohol Use: No  . Drug Use: No  . Sexual Activity: No   Other Topics Concern  . Not on file   Social History Narrative   Patient lives alone here in town  She continues to work, helping to bind and oversew books   She is divorced after 23 years of marriage   1 son- '61, minister; 1 dtr '55; 4 grandchildren   End of life care-provided packet on living well   Family History  Problem Relation Age of Onset  . Colon cancer Mother   . Diabetes Neg Hx   . Coronary artery disease Neg Hx     Filed Vitals:   02/26/15 1203  BP: 100/52  Pulse: 102  Weight: 137 lb 9 oz (62.398 kg)  SpO2: 96%    PHYSICAL EXAM: General:  Elderly. NAD; daughter and grandson present HEENT: normal Neck: supple. JVP 12 cm. Carotids 2+ bilaterally; no bruits. No lymphadenopathy or thryomegaly appreciated. Cor: PMI normal. Regular rate & rhythm. 1/6 SEM. Widely split S2.    Lungs: CTA  Abdomen: soft, nontender, nondistended. No hepatosplenomegaly. No bruits or masses. Good bowel sounds. Extremities: no cyanosis, clubbing, rash.  1+ edema 1/2 up bilaterally.   Neuro: alert & orientedx3, cranial nerves grossly intact. Moves all 4 extremities w/o difficulty. Affect pleasant  ASSESSMENT & PLAN: 1. Chronic systolic CHF: EF back down to 35-67% (4/16).  Nonischemic cardiomyopathy.  NYHA class III symptoms with residual volume overload, but she is improved since last appointment with weight down 14 lbs.   - Continue Lasix 60 mg bid and take metolazone every  Tuesday and Friday.   - Continue current KCl but need to send BMET/BNP today.  - No losartan for now with elevated creatinine.  She did not tolerate Coreg in the past and would not start beta blocker with marked volume overload.   - ECG shows wide LBBB.  If EF does not recovery, CRT could be considered.  2. Carotid stenosis: 40-59% RICA stenosis, repeat carotids in 3/17.  3. Hyperlipidemia: LDL 93 in 3/16, would not change statin.  4. Aortic stenosis/mitral regurgitation: s/p bioprosthetic AVR/MVR.  Well-seated valves on recent echo 5. CKD: BMET today.  6. Post-op AF:  No evidence of recurrence.    Joanna Middlekauff,MD 02/27/2015

## 2015-02-28 ENCOUNTER — Encounter (HOSPITAL_COMMUNITY): Payer: Medicare Other

## 2015-03-01 ENCOUNTER — Telehealth (HOSPITAL_COMMUNITY): Payer: Self-pay | Admitting: *Deleted

## 2015-03-01 NOTE — Telephone Encounter (Signed)
Ann with [facility] left vm requesting pts phone number. Left her a vm with number.

## 2015-03-09 ENCOUNTER — Other Ambulatory Visit (HOSPITAL_COMMUNITY): Payer: Self-pay

## 2015-03-09 ENCOUNTER — Telehealth (HOSPITAL_COMMUNITY): Payer: Self-pay

## 2015-03-09 MED ORDER — POTASSIUM CHLORIDE CRYS ER 20 MEQ PO TBCR: 40.0000 meq | EXTENDED_RELEASE_TABLET | Freq: Two times a day (BID) | ORAL | Status: DC

## 2015-03-09 NOTE — Telephone Encounter (Signed)
Called to address potassium changes per Dr. Shirlee Latch.  Needs to take 40 meq twice daily with extra tab on metolazone days.  No answer, LVMTCB.  Rx updated to pharmacy electronically.  Ave Filter

## 2015-03-13 ENCOUNTER — Ambulatory Visit (HOSPITAL_BASED_OUTPATIENT_CLINIC_OR_DEPARTMENT_OTHER)
Admission: RE | Admit: 2015-03-13 | Discharge: 2015-03-13 | Disposition: A | Discharge status: Home / Self Care | Payer: Medicare Other | Source: Ambulatory Visit | Attending: Cardiology | Admitting: Cardiology

## 2015-03-13 ENCOUNTER — Encounter (HOSPITAL_COMMUNITY): Payer: Self-pay

## 2015-03-13 VITALS — BP 94/62 | HR 101 | Resp 18 | Wt 146.0 lb

## 2015-03-13 DIAGNOSIS — I480 Paroxysmal atrial fibrillation: Secondary | ICD-10-CM

## 2015-03-13 DIAGNOSIS — N189 Chronic kidney disease, unspecified: Secondary | ICD-10-CM

## 2015-03-13 DIAGNOSIS — G9340 Encephalopathy, unspecified: Secondary | ICD-10-CM | POA: Diagnosis not present

## 2015-03-13 DIAGNOSIS — Z7982 Long term (current) use of aspirin: Secondary | ICD-10-CM

## 2015-03-13 DIAGNOSIS — R570 Cardiogenic shock: Secondary | ICD-10-CM | POA: Diagnosis present

## 2015-03-13 DIAGNOSIS — Z79899 Other long term (current) drug therapy: Secondary | ICD-10-CM | POA: Insufficient documentation

## 2015-03-13 DIAGNOSIS — E119 Type 2 diabetes mellitus without complications: Secondary | ICD-10-CM | POA: Insufficient documentation

## 2015-03-13 DIAGNOSIS — Z66 Do not resuscitate: Secondary | ICD-10-CM | POA: Diagnosis present

## 2015-03-13 DIAGNOSIS — Z794 Long term (current) use of insulin: Secondary | ICD-10-CM

## 2015-03-13 DIAGNOSIS — Z952 Presence of prosthetic heart valve: Secondary | ICD-10-CM | POA: Insufficient documentation

## 2015-03-13 DIAGNOSIS — I129 Hypertensive chronic kidney disease with stage 1 through stage 4 chronic kidney disease, or unspecified chronic kidney disease: Secondary | ICD-10-CM

## 2015-03-13 DIAGNOSIS — I447 Left bundle-branch block, unspecified: Secondary | ICD-10-CM | POA: Diagnosis present

## 2015-03-13 DIAGNOSIS — I6521 Occlusion and stenosis of right carotid artery: Secondary | ICD-10-CM | POA: Diagnosis present

## 2015-03-13 DIAGNOSIS — Z953 Presence of xenogenic heart valve: Secondary | ICD-10-CM | POA: Diagnosis not present

## 2015-03-13 DIAGNOSIS — I5032 Chronic diastolic (congestive) heart failure: Secondary | ICD-10-CM

## 2015-03-13 DIAGNOSIS — E785 Hyperlipidemia, unspecified: Secondary | ICD-10-CM

## 2015-03-13 DIAGNOSIS — E11649 Type 2 diabetes mellitus with hypoglycemia without coma: Secondary | ICD-10-CM | POA: Diagnosis not present

## 2015-03-13 DIAGNOSIS — I429 Cardiomyopathy, unspecified: Secondary | ICD-10-CM | POA: Insufficient documentation

## 2015-03-13 DIAGNOSIS — I080 Rheumatic disorders of both mitral and aortic valves: Secondary | ICD-10-CM | POA: Insufficient documentation

## 2015-03-13 DIAGNOSIS — R740 Nonspecific elevation of levels of transaminase and lactic acid dehydrogenase [LDH]: Secondary | ICD-10-CM | POA: Diagnosis present

## 2015-03-13 DIAGNOSIS — M1990 Unspecified osteoarthritis, unspecified site: Secondary | ICD-10-CM | POA: Diagnosis present

## 2015-03-13 DIAGNOSIS — Z9841 Cataract extraction status, right eye: Secondary | ICD-10-CM

## 2015-03-13 DIAGNOSIS — N179 Acute kidney failure, unspecified: Secondary | ICD-10-CM | POA: Diagnosis not present

## 2015-03-13 DIAGNOSIS — S82841D Displaced bimalleolar fracture of right lower leg, subsequent encounter for closed fracture with routine healing: Secondary | ICD-10-CM

## 2015-03-13 DIAGNOSIS — E875 Hyperkalemia: Secondary | ICD-10-CM | POA: Diagnosis present

## 2015-03-13 DIAGNOSIS — I130 Hypertensive heart and chronic kidney disease with heart failure and stage 1 through stage 4 chronic kidney disease, or unspecified chronic kidney disease: Secondary | ICD-10-CM | POA: Diagnosis not present

## 2015-03-13 DIAGNOSIS — Z9842 Cataract extraction status, left eye: Secondary | ICD-10-CM

## 2015-03-13 DIAGNOSIS — I739 Peripheral vascular disease, unspecified: Secondary | ICD-10-CM | POA: Insufficient documentation

## 2015-03-13 DIAGNOSIS — N390 Urinary tract infection, site not specified: Secondary | ICD-10-CM | POA: Diagnosis present

## 2015-03-13 DIAGNOSIS — N183 Chronic kidney disease, stage 3 (moderate): Secondary | ICD-10-CM | POA: Diagnosis present

## 2015-03-13 DIAGNOSIS — I5022 Chronic systolic (congestive) heart failure: Secondary | ICD-10-CM

## 2015-03-13 DIAGNOSIS — E860 Dehydration: Secondary | ICD-10-CM | POA: Diagnosis present

## 2015-03-13 DIAGNOSIS — E1151 Type 2 diabetes mellitus with diabetic peripheral angiopathy without gangrene: Secondary | ICD-10-CM | POA: Diagnosis present

## 2015-03-13 DIAGNOSIS — D509 Iron deficiency anemia, unspecified: Secondary | ICD-10-CM | POA: Diagnosis present

## 2015-03-13 DIAGNOSIS — E1129 Type 2 diabetes mellitus with other diabetic kidney complication: Secondary | ICD-10-CM | POA: Diagnosis present

## 2015-03-13 DIAGNOSIS — I255 Ischemic cardiomyopathy: Secondary | ICD-10-CM | POA: Diagnosis present

## 2015-03-13 DIAGNOSIS — Z888 Allergy status to other drugs, medicaments and biological substances status: Secondary | ICD-10-CM

## 2015-03-13 DIAGNOSIS — B9620 Unspecified Escherichia coli [E. coli] as the cause of diseases classified elsewhere: Secondary | ICD-10-CM | POA: Diagnosis present

## 2015-03-13 DIAGNOSIS — I5043 Acute on chronic combined systolic (congestive) and diastolic (congestive) heart failure: Secondary | ICD-10-CM | POA: Diagnosis present

## 2015-03-13 DIAGNOSIS — Z8673 Personal history of transient ischemic attack (TIA), and cerebral infarction without residual deficits: Secondary | ICD-10-CM

## 2015-03-13 LAB — BRAIN NATRIURETIC PEPTIDE: B NATRIURETIC PEPTIDE 5: 4332.8 pg/mL — AB (ref 0.0–100.0)

## 2015-03-13 LAB — BASIC METABOLIC PANEL
Anion gap: 14 (ref 5–15)
BUN: 57 mg/dL — ABNORMAL HIGH (ref 6–20)
CALCIUM: 8.8 mg/dL — AB (ref 8.9–10.3)
CHLORIDE: 92 mmol/L — AB (ref 101–111)
CO2: 30 mmol/L (ref 22–32)
Creatinine, Ser: 1.76 mg/dL — ABNORMAL HIGH (ref 0.44–1.00)
GFR, EST AFRICAN AMERICAN: 30 mL/min — AB (ref >60)
GFR, EST NON AFRICAN AMERICAN: 26 mL/min — AB (ref >60)
Glucose, Bld: 181 mg/dL — ABNORMAL HIGH (ref 65–99)
Potassium: 3.2 mmol/L — ABNORMAL LOW (ref 3.5–5.1)
SODIUM: 136 mmol/L (ref 135–145)

## 2015-03-13 MED ORDER — METOLAZONE 5 MG PO TABS: 5.0000 mg | ORAL_TABLET | ORAL | Status: DC

## 2015-03-13 MED ORDER — POTASSIUM CHLORIDE CRYS ER 20 MEQ PO TBCR: 60.0000 meq | EXTENDED_RELEASE_TABLET | Freq: Two times a day (BID) | ORAL | Status: DC

## 2015-03-13 MED ORDER — FUROSEMIDE 40 MG PO TABS: 80.0000 mg | ORAL_TABLET | Freq: Two times a day (BID) | ORAL | Status: DC

## 2015-03-13 NOTE — Patient Instructions (Signed)
LABS today (bmet bnp)  INCREASE Lasix to 80 mg twice a day.  TAKE Metolazone Monday Wednesday and Friday.  INCREASE Potassium to 60 MEQ twice a day and take an extra 40 MEQ with Metolazone.  FOLLOW UP in 1 week. (with NP clinic)

## 2015-03-13 NOTE — Progress Notes (Signed)
Patient ID: PATRICIAANN RABANAL, female   DOB: 06-Feb-1930, 81 y.o.   MRN: [id number] PCP: Dr Yetta Barre ([facility] on [address]) CVTS Surgeon: Dr. Cornelius Moras Primary Cardiologist: Dr. Gala Romney  HPI: Ms. Jeancharles is a 81 year old woman with aortic stenosis s/p AVR/mitral valve replacement (06/15/2013), systolic HF due to NICM, hypertension, diabetes mellitus type II, hyperlipidemia, and peripheral artery disease. Underwent AVR/MVR with placement of epicardial lead on 06/15/13 with PAF post-op terminated with amiodarone.  She is off amiodarone now due to nausea..   Admitted initially in 09/2011 with acute systolic HF, EF 30%. Cath with normal coronaries and normal cardiac output (see below). AS was mild to moderate. In hospital also found to be iron-deficiency anemia. Previous colonoscopies normal. Seen by GI who suggested trial of iron and see if it improved.  05/19/2013 ECHO EF 35% severe AS (low gradient severe AS confirmed by DSE), gradient: 32 mm Hg (S). Peak gradient: 57mm Hg  08/18/13 ECHO EF 45-50% AV working well 2/16 ECHO with EF 20-25%, moderately dilated LV with mild LVH, normal bioprosthetic mitral and aortic valves, moderate TR, PA systolic pressure 54 mmHg.  4/16 ECHO EF 20-25%, diffuse hypokinesis, mild LVH, bioprosthetic aortic valve functioning normally, bioprosthetic mitral valve with mild central MR and mean gradient 4 mmHg.   Had 30 day event monitor in 1/15 without recurrent AF.   Admitted to [facility] 12/15/14 with volume overload. Diuresed with IV lasix and transitioned to lasix 40 mg twice a day. Discharge weight 128 pounds. She went home 2/28 and fell at home. She returned to ED with negative CT scan.   Larey Seat in setting of hyperglycemia in 4/16 and fractured right ankle, admitted and had ORIF right ankle. Hospitalization complicated by volume overload.  Now at [facility] nursing home.    She is off losartan with elevated creatinine and she has not tolerated Coreg.   She continues to take Lasix 60 mg bid with twice  weekly metolazone.  Weight, however, is up about 9 lbs (after dropping 14 lbs prior to last appointment).  She remains stable dyspneic.  She continues to wear a cast on her right lower leg and is short of breath with transfers and with bathing.  No dyspnea at rest.  She seems to have a relatively high sodium diet at the nursing home.  She is orthopneic.  BP remains soft.   Labs 9/14: K 5.4, creatinine 1.69 09/2013: K+ 4.6, creatinine 1.08, pro-BNP 11643 10/17/13: K+ 4.4, creatinine 1.6 11/28/14: K+ 4.1, Creatinine 1.6 12/15/14: K 3.5, creatinine 1.72 4/16: K 4.2, creatinine 1.86, hgb 8.8 5/16: K 4 => 3.1, creatinine 1.68 => 1.61, BNP 4039 => 4185  ECG: NSR, LBBB (182 msec)  ROS:  12-point review of systems went over in clinic and all negative except as described in HPI and problem list.  Past Medical History  Diagnosis Date  . Peripheral artery disease   . Hyperlipidemia     takes Simvastatin daily  . PONV (postoperative nausea and vomiting)   . Shortness of breath   . Arthritis   . Aortic stenosis 09/21/2011  . AORTIC VALVE SCLEROSIS 02/21/2010  . 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 BUNDLE BRANCH BLOCK, LEFT 07/30/2007  . CAROTID ARTERY STENOSIS 01/05/2009  . CVA 03/14/2010  . 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 PERIPHERAL VASCULAR DISEASE 07/30/2007  . POLYPECTOMY, HX OF 07/30/2007  . Constipation   . Chronic combined systolic and diastolic CHF (congestive heart failure)     a. RHC 09/2011 RA 8, RV 58/2/11; PA 54/22 (  37) PCWP 20; F CO/CI 4.57/2.67 PVR 3.7; b. L main no sig dz, LAD luminal irreg; LCx lg Ramus with luminal irreg, small AV Lcx witout sig dz, RCA luminal irreg; severe mitral annular calcifications; AV calcified c. EF 45-50% (07/2013);  d. 11/2014 Echo: EF 20-25%, diff HK, nl AV/MV, sev dil LA, mod TR/PR, PASP .  . Diabetes mellitus   . DM 07/30/2007  . HTN (hypertension)   . HYPERTENSION 07/30/2007  . Weakness   . Dizziness   . Joint pain   . Itching   . Anemia   .  Iron deficiency anemia 09/21/2011  . Nocturia   . History of blood transfusion   . History of shingles   . Mitral regurgitation   . S/P aortic valve replacement with bioprosthetic valve 06/15/2013    21 mm [facility] Ease bovine pericardial tissue valve  . S/P mitral valve replacement with bioprosthetic valve 06/15/2013    25 mm [facility] Mitral bovine pericardial tissue valve  . Fracture, fibula, shaft 01/30/2015    right     Current Outpatient Prescriptions  Medication Sig Dispense Refill  . acetaminophen (TYLENOL) 325 MG tablet Take 325 mg by mouth every 6 (six) hours as needed for mild pain.    Marland Kitchen aspirin EC 81 MG tablet Take 1 tablet (81 mg total) by mouth daily. 90 tablet 3  . calcium citrate-vitamin D (CITRACAL+D) 315-200 MG-UNIT per tablet Take 1 tablet by mouth daily.    Marland Kitchen docusate sodium (COLACE) 100 MG capsule Take 100 mg by mouth 2 (two) times daily.      . ferrous sulfate 325 (65 FE) MG tablet Take 1 tablet (325 mg total) by mouth 2 (two) times daily with a meal. 180 tablet 1  . furosemide (LASIX) 40 MG tablet Take 2 tablets (80 mg total) by mouth 2 (two) times daily. 45 tablet 3  . HYDROcodone-acetaminophen (NORCO/VICODIN) 5-325 MG per tablet Take 1-2 tablets by mouth every 4 (four) hours as needed (breakthrough pain). 30 tablet 0  . insulin aspart (NOVOLOG) 100 UNIT/ML FlexPen Inject 3 Units into the skin 3 (three) times daily with meals. 15 mL 0  . Insulin Glargine (TOUJEO SOLOSTAR) 300 UNIT/ML SOPN Inject 20 Units into the skin daily. 4.5 mL 0  . Insulin Pen Needle (NOVOFINE) 32G X 6 MM MISC Use daily with insulin 300 each 3  . JANUVIA 50 MG tablet Take 50 mg by mouth daily.    . metolazone (ZAROXOLYN) 5 MG tablet Take 1 tablet (5 mg total) by mouth 3 (three) times a week. Monday Wednesday and Friday 15 tablet 3  . potassium chloride SA (K-DUR,KLOR-CON) 20 MEQ tablet Take 3 tablets (60 mEq total) by mouth 2 (two) times daily. Take 2 extra tabs (40 MEQ) on Mon Wed and  Fri with Metolazone 120 tablet 3  . simvastatin (ZOCOR) 20 MG tablet Take 1 tablet (20 mg total) by mouth every evening. 90 tablet 3   No current facility-administered medications for this encounter.   History   Social History  . Marital Status: Divorced    Spouse Name: N/A  . Number of Children: N/A  . Years of Education: N/A   Social History Main Topics  . Smoking status: Never Smoker   . Smokeless tobacco: Never Used  . Alcohol Use: No  . Drug Use: No  . Sexual Activity: No   Other Topics Concern  . None   Social History Narrative   Patient lives alone here in town  She continues to work, helping to bind and oversew books   She is divorced after 23 years of marriage   1 son- '61, minister; 1 dtr '55; 4 grandchildren   End of life care-provided packet on living well   Family History  Problem Relation Age of Onset  . Colon cancer Mother   . Diabetes Neg Hx   . Coronary artery disease Neg Hx     Filed Vitals:   03/13/15 0934  BP: 94/62  Pulse: 101  Resp: 18  Weight: 146 lb (66.225 kg)  SpO2: 91%    PHYSICAL EXAM: General:  Elderly. NAD; daughter and grandson present HEENT: normal Neck: supple. JVP 14 cm. Carotids 2+ bilaterally; no bruits. No lymphadenopathy or thryomegaly appreciated. Cor: PMI normal. Regular rate & rhythm. 1/6 SEM. +S3.    Lungs: CTA  Abdomen: soft, nontender, nondistended. No hepatosplenomegaly. No bruits or masses. Good bowel sounds. Extremities: no cyanosis, clubbing, rash.  2+ edema to knees bilaterally  Neuro: alert & orientedx3, cranial nerves grossly intact. Moves all 4 extremities w/o difficulty. Affect pleasant  ASSESSMENT & PLAN: 1. Chronic systolic CHF: EF back down to 16-10% (4/16).  Nonischemic cardiomyopathy.  NYHA class III symptoms with volume overload.  Symptoms are stable but she appears to be more volume overloaded than at last appointment.   - Increase Lasix to 80 mg bid and increase metolazone to 5 mg every  Mon/Wed/Fri.  Increase KCl to 60 bid with an extra 40 on metolazone days.    - BMET/BNP today and repeat in 1 week.   - No losartan for now with elevated creatinine.  She did not tolerate Coreg in the past and would not start beta blocker with marked volume overload.   - ECG shows wide LBBB.  If EF does not recovery, CRT could be considered.  - We discussed admission for diuresis today. She really wants to avoid a return to the hospital.  We will try increasing her diuretics for a week, but if there is no improvement at followup in 1 week, she will need to come into the hospital.  2. Carotid stenosis: 40-59% RICA stenosis, repeat carotids in 3/17.  3. Hyperlipidemia: LDL 93 in 3/16, would not change statin.  4. Aortic stenosis/mitral regurgitation: s/p bioprosthetic AVR/MVR.  Well-seated valves on recent echo 5. CKD: BMET today.  6. Post-op AF:  No evidence of recurrence.    Johntae Broxterman,MD 03/13/2015

## 2015-03-15 ENCOUNTER — Telehealth (HOSPITAL_COMMUNITY): Payer: Self-pay | Admitting: *Deleted

## 2015-03-15 MED ORDER — POTASSIUM CHLORIDE CRYS ER 20 MEQ PO TBCR: 60.0000 meq | EXTENDED_RELEASE_TABLET | Freq: Two times a day (BID) | ORAL | Status: DC

## 2015-03-15 NOTE — Telephone Encounter (Signed)
-----   Message from Laurey Morale, MD sent at 03/13/2015 10:43 PM EDT ----- No change to plan except would have her take an extra 60 mEq KCl on metolazone days rather than extra 40 mEq.

## 2015-03-15 NOTE — Telephone Encounter (Signed)
Order given to [facility] nursing home

## 2015-03-16 ENCOUNTER — Encounter (HOSPITAL_COMMUNITY): Payer: Self-pay | Admitting: Emergency Medicine

## 2015-03-16 ENCOUNTER — Emergency Department (HOSPITAL_COMMUNITY): Payer: Medicare Other

## 2015-03-16 ENCOUNTER — Inpatient Hospital Stay (HOSPITAL_COMMUNITY)
Admission: EM | Admit: 2015-03-16 | Discharge: 2015-03-30 | DRG: 242 | Disposition: A | Discharge status: Skilled Nursing Facility | Payer: Medicare Other | Attending: Cardiology | Admitting: Cardiology

## 2015-03-16 DIAGNOSIS — I130 Hypertensive heart and chronic kidney disease with heart failure and stage 1 through stage 4 chronic kidney disease, or unspecified chronic kidney disease: Secondary | ICD-10-CM | POA: Diagnosis present

## 2015-03-16 DIAGNOSIS — I5022 Chronic systolic (congestive) heart failure: Secondary | ICD-10-CM | POA: Diagnosis present

## 2015-03-16 DIAGNOSIS — E875 Hyperkalemia: Secondary | ICD-10-CM | POA: Diagnosis present

## 2015-03-16 DIAGNOSIS — R112 Nausea with vomiting, unspecified: Secondary | ICD-10-CM

## 2015-03-16 DIAGNOSIS — Z66 Do not resuscitate: Secondary | ICD-10-CM | POA: Diagnosis present

## 2015-03-16 DIAGNOSIS — R0600 Dyspnea, unspecified: Secondary | ICD-10-CM | POA: Diagnosis not present

## 2015-03-16 DIAGNOSIS — N390 Urinary tract infection, site not specified: Secondary | ICD-10-CM | POA: Diagnosis present

## 2015-03-16 DIAGNOSIS — N179 Acute kidney failure, unspecified: Secondary | ICD-10-CM | POA: Diagnosis present

## 2015-03-16 DIAGNOSIS — Z9842 Cataract extraction status, left eye: Secondary | ICD-10-CM | POA: Diagnosis not present

## 2015-03-16 DIAGNOSIS — I129 Hypertensive chronic kidney disease with stage 1 through stage 4 chronic kidney disease, or unspecified chronic kidney disease: Secondary | ICD-10-CM | POA: Diagnosis present

## 2015-03-16 DIAGNOSIS — I255 Ischemic cardiomyopathy: Secondary | ICD-10-CM | POA: Diagnosis present

## 2015-03-16 DIAGNOSIS — Z888 Allergy status to other drugs, medicaments and biological substances status: Secondary | ICD-10-CM | POA: Diagnosis not present

## 2015-03-16 DIAGNOSIS — E162 Hypoglycemia, unspecified: Secondary | ICD-10-CM

## 2015-03-16 DIAGNOSIS — I428 Other cardiomyopathies: Secondary | ICD-10-CM | POA: Insufficient documentation

## 2015-03-16 DIAGNOSIS — I5021 Acute systolic (congestive) heart failure: Secondary | ICD-10-CM | POA: Diagnosis not present

## 2015-03-16 DIAGNOSIS — R0689 Other abnormalities of breathing: Secondary | ICD-10-CM | POA: Diagnosis not present

## 2015-03-16 DIAGNOSIS — D509 Iron deficiency anemia, unspecified: Secondary | ICD-10-CM | POA: Diagnosis present

## 2015-03-16 DIAGNOSIS — B9620 Unspecified Escherichia coli [E. coli] as the cause of diseases classified elsewhere: Secondary | ICD-10-CM | POA: Diagnosis present

## 2015-03-16 DIAGNOSIS — E860 Dehydration: Secondary | ICD-10-CM | POA: Diagnosis present

## 2015-03-16 DIAGNOSIS — Z978 Presence of other specified devices: Secondary | ICD-10-CM

## 2015-03-16 DIAGNOSIS — Z954 Presence of other heart-valve replacement: Secondary | ICD-10-CM | POA: Diagnosis not present

## 2015-03-16 DIAGNOSIS — I480 Paroxysmal atrial fibrillation: Secondary | ICD-10-CM | POA: Diagnosis not present

## 2015-03-16 DIAGNOSIS — Z79899 Other long term (current) drug therapy: Secondary | ICD-10-CM | POA: Diagnosis not present

## 2015-03-16 DIAGNOSIS — Z794 Long term (current) use of insulin: Secondary | ICD-10-CM | POA: Diagnosis not present

## 2015-03-16 DIAGNOSIS — E1151 Type 2 diabetes mellitus with diabetic peripheral angiopathy without gangrene: Secondary | ICD-10-CM | POA: Diagnosis present

## 2015-03-16 DIAGNOSIS — E1129 Type 2 diabetes mellitus with other diabetic kidney complication: Secondary | ICD-10-CM | POA: Diagnosis present

## 2015-03-16 DIAGNOSIS — R570 Cardiogenic shock: Secondary | ICD-10-CM

## 2015-03-16 DIAGNOSIS — Z953 Presence of xenogenic heart valve: Secondary | ICD-10-CM

## 2015-03-16 DIAGNOSIS — Z7982 Long term (current) use of aspirin: Secondary | ICD-10-CM | POA: Diagnosis not present

## 2015-03-16 DIAGNOSIS — Z515 Encounter for palliative care: Secondary | ICD-10-CM | POA: Diagnosis not present

## 2015-03-16 DIAGNOSIS — I509 Heart failure, unspecified: Secondary | ICD-10-CM | POA: Diagnosis not present

## 2015-03-16 DIAGNOSIS — I6521 Occlusion and stenosis of right carotid artery: Secondary | ICD-10-CM | POA: Diagnosis present

## 2015-03-16 DIAGNOSIS — Z959 Presence of cardiac and vascular implant and graft, unspecified: Secondary | ICD-10-CM

## 2015-03-16 DIAGNOSIS — N183 Chronic kidney disease, stage 3 (moderate): Secondary | ICD-10-CM | POA: Diagnosis present

## 2015-03-16 DIAGNOSIS — I080 Rheumatic disorders of both mitral and aortic valves: Secondary | ICD-10-CM | POA: Diagnosis present

## 2015-03-16 DIAGNOSIS — M1990 Unspecified osteoarthritis, unspecified site: Secondary | ICD-10-CM | POA: Diagnosis present

## 2015-03-16 DIAGNOSIS — S82841D Displaced bimalleolar fracture of right lower leg, subsequent encounter for closed fracture with routine healing: Secondary | ICD-10-CM | POA: Diagnosis not present

## 2015-03-16 DIAGNOSIS — R740 Nonspecific elevation of levels of transaminase and lactic acid dehydrogenase [LDH]: Secondary | ICD-10-CM | POA: Diagnosis present

## 2015-03-16 DIAGNOSIS — Z452 Encounter for adjustment and management of vascular access device: Secondary | ICD-10-CM

## 2015-03-16 DIAGNOSIS — E785 Hyperlipidemia, unspecified: Secondary | ICD-10-CM | POA: Diagnosis present

## 2015-03-16 DIAGNOSIS — E872 Acidosis, unspecified: Secondary | ICD-10-CM | POA: Insufficient documentation

## 2015-03-16 DIAGNOSIS — G9340 Encephalopathy, unspecified: Secondary | ICD-10-CM

## 2015-03-16 DIAGNOSIS — E11649 Type 2 diabetes mellitus with hypoglycemia without coma: Secondary | ICD-10-CM | POA: Diagnosis not present

## 2015-03-16 DIAGNOSIS — N189 Chronic kidney disease, unspecified: Secondary | ICD-10-CM | POA: Diagnosis not present

## 2015-03-16 DIAGNOSIS — I5043 Acute on chronic combined systolic (congestive) and diastolic (congestive) heart failure: Secondary | ICD-10-CM | POA: Diagnosis present

## 2015-03-16 DIAGNOSIS — I447 Left bundle-branch block, unspecified: Secondary | ICD-10-CM | POA: Diagnosis present

## 2015-03-16 DIAGNOSIS — Z9841 Cataract extraction status, right eye: Secondary | ICD-10-CM | POA: Diagnosis not present

## 2015-03-16 DIAGNOSIS — Z8673 Personal history of transient ischemic attack (TIA), and cerebral infarction without residual deficits: Secondary | ICD-10-CM | POA: Diagnosis not present

## 2015-03-16 LAB — URINALYSIS, ROUTINE W REFLEX MICROSCOPIC
GLUCOSE, UA: NEGATIVE mg/dL
Hgb urine dipstick: NEGATIVE
Ketones, ur: NEGATIVE mg/dL
Nitrite: NEGATIVE
Protein, ur: NEGATIVE mg/dL
Specific Gravity, Urine: 1.013 (ref 1.005–1.030)
Urobilinogen, UA: 1 mg/dL (ref 0.0–1.0)
pH: 5.5 (ref 5.0–8.0)

## 2015-03-16 LAB — CBC WITH DIFFERENTIAL/PLATELET
BASOS ABS: 0 10*3/uL (ref 0.0–0.1)
Basophils Relative: 0 % (ref 0–1)
EOS ABS: 0.1 10*3/uL (ref 0.0–0.7)
Eosinophils Relative: 1 % (ref 0–5)
HCT: 45.9 % (ref 36.0–46.0)
HEMOGLOBIN: 14.5 g/dL (ref 12.0–15.0)
Lymphocytes Relative: 11 % — ABNORMAL LOW (ref 12–46)
Lymphs Abs: 1.2 10*3/uL (ref 0.7–4.0)
MCH: 30.4 pg (ref 26.0–34.0)
MCHC: 31.6 g/dL (ref 30.0–36.0)
MCV: 96.2 fL (ref 78.0–100.0)
Monocytes Absolute: 0.7 10*3/uL (ref 0.1–1.0)
Monocytes Relative: 6 % (ref 3–12)
NEUTROS ABS: 8.4 10*3/uL — AB (ref 1.7–7.7)
Neutrophils Relative %: 82 % — ABNORMAL HIGH (ref 43–77)
PLATELETS: 315 10*3/uL (ref 150–400)
RBC: 4.77 MIL/uL (ref 3.87–5.11)
RDW: 16.2 % — ABNORMAL HIGH (ref 11.5–15.5)
WBC: 10.3 10*3/uL (ref 4.0–10.5)

## 2015-03-16 LAB — BRAIN NATRIURETIC PEPTIDE: B Natriuretic Peptide: >4500 pg/mL — ABNORMAL HIGH (ref 0.0–100.0)

## 2015-03-16 LAB — URINE MICROSCOPIC-ADD ON

## 2015-03-16 LAB — COMPREHENSIVE METABOLIC PANEL
ALT: 92 U/L — ABNORMAL HIGH (ref 14–54)
ANION GAP: 16 — AB (ref 5–15)
AST: 154 U/L — ABNORMAL HIGH (ref 15–41)
Albumin: 3.7 g/dL (ref 3.5–5.0)
Alkaline Phosphatase: 146 U/L — ABNORMAL HIGH (ref 38–126)
BILIRUBIN TOTAL: 3.2 mg/dL — AB (ref 0.3–1.2)
BUN: 69 mg/dL — ABNORMAL HIGH (ref 6–20)
CHLORIDE: 92 mmol/L — AB (ref 101–111)
CO2: 24 mmol/L (ref 22–32)
Calcium: 9.6 mg/dL (ref 8.9–10.3)
Creatinine, Ser: 2.58 mg/dL — ABNORMAL HIGH (ref 0.44–1.00)
GFR calc Af Amer: 19 mL/min — ABNORMAL LOW (ref >60)
GFR calc non Af Amer: 16 mL/min — ABNORMAL LOW (ref >60)
GLUCOSE: 166 mg/dL — AB (ref 65–99)
POTASSIUM: 7.5 mmol/L — AB (ref 3.5–5.1)
Sodium: 132 mmol/L — ABNORMAL LOW (ref 135–145)
Total Protein: 6.6 g/dL (ref 6.5–8.1)

## 2015-03-16 LAB — I-STAT TROPONIN, ED: TROPONIN I, POC: 0.11 ng/mL — AB (ref 0.00–0.08)

## 2015-03-16 LAB — GLUCOSE, CAPILLARY: GLUCOSE-CAPILLARY: 169 mg/dL — AB (ref 65–99)

## 2015-03-16 LAB — I-STAT CG4 LACTIC ACID, ED: Lactic Acid, Venous: 5.05 mmol/L (ref 0.5–2.0)

## 2015-03-16 LAB — MRSA PCR SCREENING: MRSA by PCR: NEGATIVE

## 2015-03-16 LAB — AMMONIA: AMMONIA: 31 umol/L (ref 9–35)

## 2015-03-16 LAB — LACTIC ACID, PLASMA: LACTIC ACID, VENOUS: 3.3 mmol/L — AB (ref 0.5–2.0)

## 2015-03-16 MED ORDER — FERROUS SULFATE 325 (65 FE) MG PO TABS
325.0000 mg | ORAL_TABLET | Freq: Two times a day (BID) | ORAL | Status: DC
  Administered 2015-03-18 – 2015-03-30 (×25): 325 mg via ORAL
  Filled 2015-03-16 (×30): qty 1

## 2015-03-16 MED ORDER — SODIUM POLYSTYRENE SULFONATE 15 GM/60ML PO SUSP
30.0000 g | Freq: Once | ORAL | Status: AC
  Administered 2015-03-16: 30 g via ORAL
  Filled 2015-03-16: qty 120

## 2015-03-16 MED ORDER — INSULIN GLARGINE 300 UNIT/ML ~~LOC~~ SOPN: 20.0000 [IU] | PEN_INJECTOR | Freq: Every day | SUBCUTANEOUS | Status: DC

## 2015-03-16 MED ORDER — SODIUM CHLORIDE 0.9 % IV SOLN
INTRAVENOUS | Status: AC
  Administered 2015-03-16: 21:00:00 via INTRAVENOUS

## 2015-03-16 MED ORDER — ASPIRIN EC 81 MG PO TBEC
81.0000 mg | DELAYED_RELEASE_TABLET | Freq: Every day | ORAL | Status: DC
  Administered 2015-03-18 – 2015-03-23 (×6): 81 mg via ORAL
  Filled 2015-03-16 (×8): qty 1

## 2015-03-16 MED ORDER — CALCIUM CITRATE-VITAMIN D 315-200 MG-UNIT PO TABS
1.0000 | ORAL_TABLET | Freq: Every day | ORAL | Status: DC
Start: 2015-03-16 — End: 2015-03-16

## 2015-03-16 MED ORDER — DEXTROSE 5 % IV SOLN
1.0000 g | Freq: Once | INTRAVENOUS | Status: AC
  Administered 2015-03-16: 1 g via INTRAVENOUS
  Filled 2015-03-16: qty 10

## 2015-03-16 MED ORDER — ENOXAPARIN SODIUM 40 MG/0.4ML ~~LOC~~ SOLN: 40.0000 mg | SUBCUTANEOUS | Status: DC

## 2015-03-16 MED ORDER — SODIUM CHLORIDE 0.9 % IV BOLUS (SEPSIS): 250.0000 mL | Freq: Once | INTRAVENOUS | Status: DC

## 2015-03-16 MED ORDER — ENOXAPARIN SODIUM 30 MG/0.3ML ~~LOC~~ SOLN
30.0000 mg | Freq: Every day | SUBCUTANEOUS | Status: DC
  Administered 2015-03-16 – 2015-03-20 (×5): 30 mg via SUBCUTANEOUS
  Filled 2015-03-16 (×5): qty 0.3

## 2015-03-16 MED ORDER — ONDANSETRON HCL 4 MG PO TABS: 4.0000 mg | ORAL_TABLET | ORAL | Status: DC | PRN

## 2015-03-16 MED ORDER — DOCUSATE SODIUM 100 MG PO CAPS
100.0000 mg | ORAL_CAPSULE | Freq: Two times a day (BID) | ORAL | Status: DC
  Administered 2015-03-16 – 2015-03-24 (×14): 100 mg via ORAL
  Filled 2015-03-16 (×19): qty 1

## 2015-03-16 MED ORDER — DEXTROSE 50 % IV SOLN
50.0000 mL | Freq: Once | INTRAVENOUS | Status: AC
  Administered 2015-03-16: 50 mL via INTRAVENOUS
  Filled 2015-03-16: qty 50

## 2015-03-16 MED ORDER — ONDANSETRON HCL 4 MG/2ML IJ SOLN: 4.0000 mg | Freq: Four times a day (QID) | INTRAMUSCULAR | Status: DC | PRN

## 2015-03-16 MED ORDER — SODIUM CHLORIDE 0.9 % IV SOLN
1.0000 g | Freq: Once | INTRAVENOUS | Status: AC
  Administered 2015-03-16: 1 g via INTRAVENOUS
  Filled 2015-03-16: qty 10

## 2015-03-16 MED ORDER — OXYCODONE HCL 5 MG PO TABS
5.0000 mg | ORAL_TABLET | Freq: Four times a day (QID) | ORAL | Status: DC | PRN
  Administered 2015-03-17: 5 mg via ORAL
  Filled 2015-03-16: qty 1

## 2015-03-16 MED ORDER — CALCIUM CARBONATE-VITAMIN D 500-200 MG-UNIT PO TABS
1.0000 | ORAL_TABLET | Freq: Every day | ORAL | Status: DC
  Administered 2015-03-18 – 2015-03-30 (×13): 1 via ORAL
  Filled 2015-03-16 (×15): qty 1

## 2015-03-16 MED ORDER — LINAGLIPTIN 5 MG PO TABS
5.0000 mg | ORAL_TABLET | Freq: Every day | ORAL | Status: DC
  Administered 2015-03-18 – 2015-03-30 (×13): 5 mg via ORAL
  Filled 2015-03-16 (×14): qty 1

## 2015-03-16 MED ORDER — INSULIN GLARGINE 100 UNIT/ML ~~LOC~~ SOLN
20.0000 [IU] | Freq: Every day | SUBCUTANEOUS | Status: DC
  Administered 2015-03-16: 20 [IU] via SUBCUTANEOUS
  Filled 2015-03-16 (×2): qty 0.2

## 2015-03-16 MED ORDER — INSULIN ASPART 100 UNIT/ML ~~LOC~~ SOLN
5.0000 [IU] | Freq: Once | SUBCUTANEOUS | Status: AC
  Administered 2015-03-16: 5 [IU] via INTRAVENOUS
  Filled 2015-03-16: qty 1

## 2015-03-16 MED ORDER — ONDANSETRON HCL 4 MG PO TABS: 4.0000 mg | ORAL_TABLET | Freq: Four times a day (QID) | ORAL | Status: DC | PRN

## 2015-03-16 MED ORDER — SODIUM CHLORIDE 0.9 % IV BOLUS (SEPSIS)
250.0000 mL | Freq: Once | INTRAVENOUS | Status: AC
  Administered 2015-03-16: 250 mL via INTRAVENOUS

## 2015-03-16 MED ORDER — ALBUTEROL (5 MG/ML) CONTINUOUS INHALATION SOLN
10.0000 mg/h | INHALATION_SOLUTION | RESPIRATORY_TRACT | Status: DC
  Administered 2015-03-16: 10 mg/h via RESPIRATORY_TRACT
  Filled 2015-03-16: qty 20

## 2015-03-16 MED ORDER — INSULIN ASPART 100 UNIT/ML ~~LOC~~ SOLN
0.0000 [IU] | Freq: Three times a day (TID) | SUBCUTANEOUS | Status: DC
  Administered 2015-03-18 (×2): 3 [IU] via SUBCUTANEOUS
  Administered 2015-03-18 – 2015-03-19 (×2): 1 [IU] via SUBCUTANEOUS
  Administered 2015-03-19 – 2015-03-20 (×3): 2 [IU] via SUBCUTANEOUS
  Administered 2015-03-20 (×2): 1 [IU] via SUBCUTANEOUS
  Administered 2015-03-22 (×2): 2 [IU] via SUBCUTANEOUS
  Administered 2015-03-22 – 2015-03-23 (×2): 3 [IU] via SUBCUTANEOUS
  Administered 2015-03-24: 2 [IU] via SUBCUTANEOUS
  Administered 2015-03-25: 1 [IU] via SUBCUTANEOUS
  Administered 2015-03-25: 2 [IU] via SUBCUTANEOUS
  Administered 2015-03-26: 3 [IU] via SUBCUTANEOUS
  Administered 2015-03-26: 2 [IU] via SUBCUTANEOUS
  Administered 2015-03-27: 1 [IU] via SUBCUTANEOUS
  Administered 2015-03-27: 2 [IU] via SUBCUTANEOUS
  Administered 2015-03-28 – 2015-03-29 (×5): 1 [IU] via SUBCUTANEOUS

## 2015-03-16 MED ORDER — SODIUM BICARBONATE 8.4 % IV SOLN
50.0000 meq | Freq: Once | INTRAVENOUS | Status: AC
  Administered 2015-03-16: 50 meq via INTRAVENOUS
  Filled 2015-03-16: qty 50

## 2015-03-16 NOTE — ED Notes (Signed)
EMS - Patient coming from [facility] with c/o of nausea x 1 month, weakness x 1 week and chills x 1 week.  Patient given 5mg  oral zofran with facility.  Oral temp with EMS was 94.7, HR 90, 98% RA, and 130/72.  Patient is alert and oriented at discharge.

## 2015-03-16 NOTE — H&P (Signed)
[facility] History and Physical  Joanna [name] RUE:[id number] DOB: Jan 23, 1930 DOA: 03/16/2015  Referring physician: Dr.Rees. PCP: Sanda Linger, MD  Specialists: Dr.Bensimon.  Chief Complaint: Nausea vomiting.  HPI: Joanna [name] is a 81 y.o. female with history of chronic systolic heart failure last EF measured was 20-25% in April 2016, bioprosthetic aortic valve and mitral valve replacement, chronic kidney disease creatinine is usually around 1.4-1.6, was brought to the ER after patient was having persistent nausea and poor oral intake with one episode of nausea and vomiting today. As per the family patient has been not doing well over the last few days has patient has been having poor appetite and nausea. Patient denies any abdominal pain chest pain. Patient has chronic shortness of breath. Patient had recent right ORIF of the comminuted fracture of the right fibula and has a cast on. Patient was transferred to rehabilitation for last few weeks. In the ER patient was found to have elevated lactic acid level with worsening creatinine from her baseline with hyperkalemia. Pulmonary critical care and cardiology evaluation a consult and at this time patient is being admitted for dehydration with renal failure and further workup on her nausea and vomiting. On exam patient's abdomen appears benign but LFTs are elevated. Patient was given D50 IV insulin and bicarbonate 1 ampule with Kayexalate 30 g for the hyperkalemia. Patient also was given to 50 mL normal seen bolus for the acute renal failure and dehydration.   Review of Systems: As presented in the history of presenting illness, rest negative.  Past Medical History  Diagnosis Date  . Peripheral artery disease   . Hyperlipidemia     takes Simvastatin daily  . PONV (postoperative nausea and vomiting)   . Shortness of breath   . Arthritis   . Aortic stenosis 09/21/2011  . AORTIC VALVE SCLEROSIS 02/21/2010  . Arthus phenomenon 09/17/2007   . BUNDLE BRANCH BLOCK, LEFT 07/30/2007  . CAROTID ARTERY STENOSIS 01/05/2009  . CVA 03/14/2010  . 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 PERIPHERAL VASCULAR DISEASE 07/30/2007  . POLYPECTOMY, HX OF 07/30/2007  . Constipation   . Chronic combined systolic and diastolic CHF (congestive heart failure)     a. RHC 09/2011 RA 8, RV 58/2/11; PA 54/22 (37) PCWP 20; F CO/CI 4.57/2.67 PVR 3.7; b. L main no sig dz, LAD luminal irreg; LCx lg Ramus with luminal irreg, small AV Lcx witout sig dz, RCA luminal irreg; severe mitral annular calcifications; AV calcified c. EF 45-50% (07/2013);  d. 11/2014 Echo: EF 20-25%, diff HK, nl AV/MV, sev dil LA, mod TR/PR, PASP .  . Diabetes mellitus   . DM 07/30/2007  . HTN (hypertension)   . HYPERTENSION 07/30/2007  . Weakness   . Dizziness   . Joint pain   . Itching   . Anemia   . Iron deficiency anemia 09/21/2011  . Nocturia   . History of blood transfusion   . History of shingles   . Mitral regurgitation   . S/P aortic valve replacement with bioprosthetic valve 06/15/2013    21 mm [facility] Ease bovine pericardial tissue valve  . S/P mitral valve replacement with bioprosthetic valve 06/15/2013    25 mm [facility] Mitral bovine pericardial tissue valve  . Fracture, fibula, shaft 01/30/2015    right   Past Surgical History  Procedure Laterality Date  . Carotid endarterectomy Left 2011  . Polypectomy  12/27/04  . Cataract extraction Bilateral 2009  . Tee without cardioversion N/A 05/19/2013  Procedure: TRANSESOPHAGEAL ECHOCARDIOGRAM (TEE);  Surgeon: Laurey Morale, MD;  Location: [facility] ENDOSCOPY;  Service: Cardiovascular;  Laterality: N/A;  . Colonoscopy    . Cardiac catheterization  2012  . Aortic valve replacement N/A 06/15/2013    Procedure: AORTIC VALVE REPLACEMENT (AVR);  Surgeon: Purcell Nails, MD;  Location: [facility] OR;  Service: Open Heart Surgery;  Laterality: N/A;  . Intraoperative transesophageal echocardiogram N/A 06/15/2013    Procedure:  INTRAOPERATIVE TRANSESOPHAGEAL ECHOCARDIOGRAM;  Surgeon: Purcell Nails, MD;  Location: [facility] OR;  Service: Open Heart Surgery;  Laterality: N/A;  . Mitral valve replacement N/A 06/15/2013    Procedure: MITRAL VALVE (MV) REPLACEMENT;  Surgeon: Purcell Nails, MD;  Location: [facility] OR;  Service: Open Heart Surgery;  Laterality: N/A;  . Epicardial pacing lead placement N/A 06/15/2013    Procedure: EPICARDIAL PACING LEAD PLACEMENT;  Surgeon: Purcell Nails, MD;  Location: [facility] OR;  Service: Open Heart Surgery;  Laterality: N/A;  . Left and right heart catheterization with coronary angiogram N/A 09/19/2011    Procedure: LEFT AND RIGHT HEART CATHETERIZATION WITH CORONARY ANGIOGRAM;  Surgeon: Dolores Patty, MD;  Location[facility] C[facility] LAB;  Service: Cardiovascular;  Laterality: N/A;  . Orif ankle fracture Right 01/31/2015    Procedure: OPEN REDUCTION INTERNAL FIXATION (ORIF) ANKLE FRACTURE;  Surgeon: Eldred Manges, MD;  Location: [facility] OR;  Service: Orthopedics;  Laterality: Right;   Social History:  reports that she has never smoked. She has never used smokeless tobacco. She reports that she does not drink alcohol or use illicit drugs. Where does patient live presently lives in a nursing home. Can patient participate in ADLs? Not sure.  Allergies  Allergen Reactions  . Other Hives    STEROIDS  . Prednisone Hives and Other (See Comments)    She is allergic to all steroids!    Family History:  Family History  Problem Relation Age of Onset  . Colon cancer Mother   . Diabetes Neg Hx   . Coronary artery disease Neg Hx       Prior to Admission medications   Medication Sig Start Date End Date Taking? Authorizing Provider  acetaminophen (TYLENOL) 325 MG tablet Take 325 mg by mouth every 6 (six) hours as needed for mild pain.   Yes Historical Provider, MD  aspirin EC 81 MG tablet Take 1 tablet (81 mg total) by mouth daily. 07/10/14  Yes Dolores Patty, MD  calcium citrate-vitamin D (CITRACAL+D) 315-200  MG-UNIT per tablet Take 1 tablet by mouth daily.   Yes Historical Provider, MD  docusate sodium (COLACE) 100 MG capsule Take 100 mg by mouth 2 (two) times daily.     Yes Historical Provider, MD  ferrous sulfate 325 (65 FE) MG tablet Take 1 tablet (325 mg total) by mouth 2 (two) times daily with a meal. 02/28/14  Yes Etta Grandchild, MD  furosemide (LASIX) 80 MG tablet Take 80 mg by mouth 2 (two) times daily.   Yes Historical Provider, MD  HYDROcodone-acetaminophen (NORCO/VICODIN) 5-325 MG per tablet Take 1-2 tablets by mouth every 4 (four) hours as needed (breakthrough pain). 02/02/15  Yes Jerald Kief, MD  insulin aspart (NOVOLOG) 100 UNIT/ML FlexPen Inject 3 Units into the skin 3 (three) times daily with meals. 02/02/15  Yes Jerald Kief, MD  Insulin Glargine (TOUJEO SOLOSTAR) 300 UNIT/ML SOPN Inject 20 Units into the skin daily. 02/02/15  Yes Jerald Kief, MD  JANUVIA 50 MG tablet Take 50 mg by mouth daily.  10/28/14  Yes Historical Provider, MD  metolazone (ZAROXOLYN) 5 MG tablet Take 1 tablet (5 mg total) by mouth 3 (three) times a week. Monday Wednesday and Friday 03/13/15  Yes Laurey Morale, MD  metolazone (ZAROXOLYN) 5 MG tablet Take 5 mg by mouth See admin instructions. Take on Monday Wednesday and Friday (Give Prior to lasix)   Yes Historical Provider, MD  ondansetron (ZOFRAN) 4 MG tablet Take 4 mg by mouth every 4 (four) hours as needed for nausea or vomiting.   Yes Historical Provider, MD  potassium chloride SA (K-DUR,KLOR-CON) 20 MEQ tablet Take 40 mEq by mouth See admin instructions. Take on Monday Wednesday and Fridays when metolazone is administered.   Yes Historical Provider, MD  potassium chloride SA (K-DUR,KLOR-CON) 20 MEQ tablet Take 60 mEq by mouth 2 (two) times daily.   Yes Historical Provider, MD  promethazine (PHENERGAN) 25 MG tablet Take 25 mg by mouth every 12 (twelve) hours as needed for nausea or vomiting.   Yes Historical Provider, MD  simvastatin (ZOCOR) 20 MG tablet Take 1  tablet (20 mg total) by mouth every evening. 02/28/14  Yes Etta Grandchild, MD  furosemide (LASIX) 40 MG tablet Take 2 tablets (80 mg total) by mouth 2 (two) times daily. Patient not taking: Reported on 03/16/2015 03/13/15   Laurey Morale, MD  Insulin Pen Needle (NOVOFINE) 32G X 6 MM MISC Use daily with insulin 12/29/14   Etta Grandchild, MD  potassium chloride SA (K-DUR,KLOR-CON) 20 MEQ tablet Take 3 tablets (60 mEq total) by mouth 2 (two) times daily. Take 3 extra tabs (60 MEQ) on Mon Wed and Fri with Metolazone Patient not taking: Reported on 03/16/2015 03/15/15   Laurey Morale, MD    Physical Exam: Filed Vitals:   03/16/15 1800 03/16/15 1815 03/16/15 1822 03/16/15 1830  BP: 101/71 116/74  124/86  Pulse: 90 45  98  Temp:  96.2 F (35.7 C) 97.8 F (36.6 C)   TempSrc:  Axillary Oral   Resp:      Height:      Weight:      SpO2: 100% 100%  100%     General:  Moderately built and poorly nourished.  Eyes: Anicteric no pallor.  ENT: No discharge from the ears eyes nose or mouth.  Neck: No mass felt. JVD elevated.  Cardiovascular: S1-S2 heard.  Respiratory: No rhonchi or crepitations.  Abdomen: Soft nontender bowel sounds present.  Skin: No rash.  Musculoskeletal: Right lower extremity is in cast.  Psychiatric: Appears normal.  Neurologic: Alert awake oriented to time place and person. Moves all extremities.  Labs on Admission:  Basic Metabolic Panel:  Recent Labs Lab 03/13/15 1028 03/16/15 1619  NA 136 132*  K 3.2* 7.5*  CL 92* 92*  CO2 30 24  GLUCOSE 181* 166*  BUN 57* 69*  CREATININE 1.76* 2.58*  CALCIUM 8.8* 9.6   Liver Function Tests:  Recent Labs Lab 03/16/15 1619  AST 154*  ALT 92*  ALKPHOS 146*  BILITOT 3.2*  PROT 6.6  ALBUMIN 3.7   No results for input(s): LIPASE, AMYLASE in the last 168 hours. No results for input(s): AMMONIA in the last 168 hours. CBC:  Recent Labs Lab 03/16/15 1619  WBC 10.3  NEUTROABS 8.4*  HGB 14.5  HCT 45.9   MCV 96.2  PLT 315   Cardiac Enzymes: No results for input(s): CKTOTAL, CKMB, CKMBINDEX, TROPONINI in the last 168 hours.  BNP (last 3 results)  Recent Labs  02/26/15 1245 03/13/15 1028 03/16/15 1619  BNP 4185.3* 4332.8* >4500.0*    ProBNP (last 3 results) No results for input(s): PROBNP in the last 8760 hours.  CBG: No results for input(s): GLUCAP in the last 168 hours.  Radiological Exams on Admission: Dg Chest 2 View  03/16/2015   CLINICAL DATA:  Acute onset of chest pain and shortness of breath. Cough and congestion. Initial encounter.  EXAM: CHEST  2 VIEW  COMPARISON:  Chest radiograph performed 12/14/2014  FINDINGS: The lungs are well-aerated. A small left pleural effusion is noted. Chronically increased interstitial markings are noted. Mild peribronchial thickening is noted. No pneumothorax is identified. Scarring is seen at the left lung apex.  The heart is enlarged. The patient is status post median sternotomy. A valve replacement is noted. Orphaned pacemaker leads are visualized. No acute osseous abnormalities are seen.  IMPRESSION: Small left pleural effusion noted. Chronically increased interstitial markings seen. Cardiomegaly.   Electronically Signed   By: Roanna Raider M.D.   On: 03/16/2015 17:26    EKG: Independently reviewed. Ectopic rhythm with LBBB.  Assessment/Plan Principal Problem:   Nausea & vomiting Active Problems:   Chronic systolic heart failure   S/P aortic and mitral valve replacement with bioprosthetic valves   PAF (paroxysmal atrial fibrillation)   Hyperkalemia   Renal failure (ARF), acute on chronic   Acute renal failure syndrome   1. Acute on chronic renal failure with hyperkalemia - probably prerenal secondary to poor oral intake and diuretics. Patient has already received Kayexalate 30 g bicarbonate D50 IV insulin and 250 mL normal saline bolus. I have ordered another 300 mL IV normal saline fluid overnight 4 hours. I have discontinued  patient's potassium replacement and Lasix and metolazone. Closely follow intake output and metabolic panel. Recheck lactic acid after hydration and holding diuretics. 2. Nausea and vomiting with elevated LFTs - abdomen appears benign. Urine does show some WBCs. I have ordered urine cultures. Check sonogram of the abdomen for any gallbladder pathology. Check lipase. Closely follow LFTs. 3. Chronic systolic heart failure last EF measured in April 2016 was 20 - 25% - presently holding off Lasix and metolazone secondary to #1. 4. Diabetes mellitus type 2 controlled - continue long-acting insulin with sliding scale coverage and closely follow CBGs as patient has renal failure. 5. Status post bioprosthetic aortic and mitral valve replacement - as per the recent 2-D echo valves are in good position. 6. Elevated troponin - patient has no chest pain we'll cycle cardiac markers. 7. Hyperlipidemia on statins. 8. Recent right ORIF of the fibula - on cast.   DVT Prophylaxis Lovenox.  Code Status: Full code.  Family Communication: Patient's son and daughter.  Disposition Plan: Admit to inpatient. Likely stay 2 to 3 days.    Yetunde Leis N. [facility] Pager [phone].  If 7PM-7AM, please contact night-coverage [email or web address] Password [facility] 03/16/2015, 8:24 PM

## 2015-03-16 NOTE — ED Notes (Signed)
pts family states that pt has hx of chf and has been retaining fluid. She has been excessively thirsty and hungry recently since she has been at the rehab facility for her broken ankle.

## 2015-03-16 NOTE — ED Provider Notes (Signed)
CSN: [id number]     Arrival date & time 03/16/15  1459 History   First MD Initiated Contact with Patient 03/16/15 1512     Chief Complaint  Patient presents with  . Nausea  . Weakness     Patient is a 81 y.o. female presenting with weakness. The history is provided by the patient. No language interpreter was used.  Weakness   Joanna [name] presents for multiple complaints, primarily nausea.  She reports nausea for the last two weeks.  The nausea is intermittent in nature and is occasionally associated with emesis.  She reports chronic chest pain for the last five years.  She denies fevers, diarrhea, dysuria.  She has SOB for the last five years.  Level V caveat due to confusion.  She reports that she feels full of fluid, endorses orthopnea.  Per NH papers she was sent due to hypothermia and nausea. Past Medical History  Diagnosis Date  . Peripheral artery disease   . Hyperlipidemia     takes Simvastatin daily  . PONV (postoperative nausea and vomiting)   . Shortness of breath   . Arthritis   . Aortic stenosis 09/21/2011  . AORTIC VALVE SCLEROSIS 02/21/2010  . 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 BUNDLE BRANCH BLOCK, LEFT 07/30/2007  . CAROTID ARTERY STENOSIS 01/05/2009  . CVA 03/14/2010  . 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 PERIPHERAL VASCULAR DISEASE 07/30/2007  . POLYPECTOMY, HX OF 07/30/2007  . Constipation   . Chronic combined systolic and diastolic CHF (congestive heart failure)     a. RHC 09/2011 RA 8, RV 58/2/11; PA 54/22 (37) PCWP 20; F CO/CI 4.57/2.67 PVR 3.7; b. L main no sig dz, LAD luminal irreg; LCx lg Ramus with luminal irreg, small AV Lcx witout sig dz, RCA luminal irreg; severe mitral annular calcifications; AV calcified c. EF 45-50% (07/2013);  d. 11/2014 Echo: EF 20-25%, diff HK, nl AV/MV, sev dil LA, mod TR/PR, PASP .  . Diabetes mellitus   . DM 07/30/2007  . HTN (hypertension)   . HYPERTENSION 07/30/2007  . Weakness   . Dizziness   . Joint pain   . Itching   .  Anemia   . Iron deficiency anemia 09/21/2011  . Nocturia   . History of blood transfusion   . History of shingles   . Mitral regurgitation   . S/P aortic valve replacement with bioprosthetic valve 06/15/2013    21 mm [facility] Ease bovine pericardial tissue valve  . S/P mitral valve replacement with bioprosthetic valve 06/15/2013    25 mm [facility] Mitral bovine pericardial tissue valve  . Fracture, fibula, shaft 01/30/2015    right   Past Surgical History  Procedure Laterality Date  . Carotid endarterectomy Left 2011  . Polypectomy  12/27/04  . Cataract extraction Bilateral 2009  . Tee without cardioversion N/A 05/19/2013    Procedure: TRANSESOPHAGEAL ECHOCARDIOGRAM (TEE);  Surgeon: Laurey Morale, MD;  Location: [facility] ENDOSCOPY;  Service: Cardiovascular;  Laterality: N/A;  . Colonoscopy    . Cardiac catheterization  2012  . Aortic valve replacement N/A 06/15/2013    Procedure: AORTIC VALVE REPLACEMENT (AVR);  Surgeon: Purcell Nails, MD;  Location: [facility] OR;  Service: Open Heart Surgery;  Laterality: N/A;  . Intraoperative transesophageal echocardiogram N/A 06/15/2013    Procedure: INTRAOPERATIVE TRANSESOPHAGEAL ECHOCARDIOGRAM;  Surgeon: Purcell Nails, MD;  Location: [facility] OR;  Service: Open Heart Surgery;  Laterality: N/A;  . Mitral valve replacement N/A 06/15/2013    Procedure: MITRAL VALVE (MV) REPLACEMENT;  Surgeon: Purcell Nails, MD;  Location: [facility] OR;  Service: Open Heart Surgery;  Laterality: N/A;  . Epicardial pacing lead placement N/A 06/15/2013    Procedure: EPICARDIAL PACING LEAD PLACEMENT;  Surgeon: Purcell Nails, MD;  Location: [facility] OR;  Service: Open Heart Surgery;  Laterality: N/A;  . Left and right heart catheterization with coronary angiogram N/A 09/19/2011    Procedure: LEFT AND RIGHT HEART CATHETERIZATION WITH CORONARY ANGIOGRAM;  Surgeon: Dolores Patty, MD;  Location: [facility] CATH LAB;  Service: Cardiovascular;  Laterality: N/A;  . Orif ankle fracture Right 01/31/2015     Procedure: OPEN REDUCTION INTERNAL FIXATION (ORIF) ANKLE FRACTURE;  Surgeon: Eldred Manges, MD;  Location: [facility] OR;  Service: Orthopedics;  Laterality: Right;   Family History  Problem Relation Age of Onset  . Colon cancer Mother   . Diabetes Neg Hx   . Coronary artery disease Neg Hx    History  Substance Use Topics  . Smoking status: Never Smoker   . Smokeless tobacco: Never Used  . Alcohol Use: No   OB History    No data available     Review of Systems  Neurological: Positive for weakness.  All other systems reviewed and are negative.     Allergies  Other and Prednisone  Home Medications   Prior to Admission medications   Medication Sig Start Date End Date Taking? Authorizing Provider  acetaminophen (TYLENOL) 325 MG tablet Take 325 mg by mouth every 6 (six) hours as needed for mild pain.    Historical Provider, MD  aspirin EC 81 MG tablet Take 1 tablet (81 mg total) by mouth daily. 07/10/14   Dolores Patty, MD  calcium citrate-vitamin D (CITRACAL+D) 315-200 MG-UNIT per tablet Take 1 tablet by mouth daily.    Historical Provider, MD  docusate sodium (COLACE) 100 MG capsule Take 100 mg by mouth 2 (two) times daily.      Historical Provider, MD  ferrous sulfate 325 (65 FE) MG tablet Take 1 tablet (325 mg total) by mouth 2 (two) times daily with a meal. 02/28/14   Etta Grandchild, MD  furosemide (LASIX) 40 MG tablet Take 2 tablets (80 mg total) by mouth 2 (two) times daily. 03/13/15   Laurey Morale, MD  HYDROcodone-acetaminophen (NORCO/VICODIN) 5-325 MG per tablet Take 1-2 tablets by mouth every 4 (four) hours as needed (breakthrough pain). 02/02/15   Jerald Kief, MD  insulin aspart (NOVOLOG) 100 UNIT/ML FlexPen Inject 3 Units into the skin 3 (three) times daily with meals. 02/02/15   Jerald Kief, MD  Insulin Glargine (TOUJEO SOLOSTAR) 300 UNIT/ML SOPN Inject 20 Units into the skin daily. 02/02/15   Jerald Kief, MD  Insulin Pen Needle (NOVOFINE) 32G X 6 MM MISC Use  daily with insulin 12/29/14   Etta Grandchild, MD  JANUVIA 50 MG tablet Take 50 mg by mouth daily. 10/28/14   Historical Provider, MD  metolazone (ZAROXOLYN) 5 MG tablet Take 1 tablet (5 mg total) by mouth 3 (three) times a week. Monday Wednesday and Friday 03/13/15   Laurey Morale, MD  potassium chloride SA (K-DUR,KLOR-CON) 20 MEQ tablet Take 3 tablets (60 mEq total) by mouth 2 (two) times daily. Take 3 extra tabs (60 MEQ) on Mon Wed and Fri with Metolazone 03/15/15   Laurey Morale, MD  simvastatin (ZOCOR) 20 MG tablet Take 1 tablet (20 mg total) by mouth every evening. 02/28/14   Etta Grandchild, MD   BP 147/123  mmHg  Pulse 92  Temp(Src) 97.5 F (36.4 C) (Oral)  Resp 16  Ht  (1.626 m)  Wt 124 lb (56.246 kg)  BMI 21.27 kg/m2  SpO2 96% Physical Exam  Constitutional: She appears well-developed and well-nourished.  HENT:  Head: Normocephalic and atraumatic.  Cardiovascular: Normal rate and regular rhythm.   No murmur heard. Pulmonary/Chest: Effort normal. No respiratory distress.  Fine crackles in bilateral bases  Abdominal: Soft. There is no tenderness. There is no rebound and no guarding.  Musculoskeletal:  Cast on RLE.  Toes well perfused.  LLE with 3+ pitting edema.  Skin tear to left lateral lower extremity, no surrounding cellulitis  Neurological: She is alert.  Disoriented to time, mildly confused  Skin: Skin is warm and dry.  Psychiatric: She has a normal mood and affect. Her behavior is normal.  Nursing note and vitals reviewed.   ED Course  Procedures (including critical care time) CRITICAL CARE Performed by: Tilden Fossa   Total critical care time: 30 minutes  Critical care time was exclusive of separately billable procedures and treating other patients.  Critical care was necessary to treat or prevent imminent or life-threatening deterioration.  Critical care was time spent personally by me on the following activities: development of treatment plan with  patient and/or surrogate as well as nursing, discussions with consultants, evaluation of patient's response to treatment, examination of patient, obtaining history from patient or surrogate, ordering and performing treatments and interventions, ordering and review of laboratory studies, ordering and review of radiographic studies, pulse oximetry and re-evaluation of patient's condition.  Labs Review Labs Reviewed  COMPREHENSIVE METABOLIC PANEL - Abnormal; Notable for the following:    Sodium 132 (*)    Potassium 7.5 (*)    Chloride 92 (*)    Glucose, Bld 166 (*)    BUN 69 (*)    Creatinine, Ser 2.58 (*)    AST 154 (*)    ALT 92 (*)    Alkaline Phosphatase 146 (*)    Total Bilirubin 3.2 (*)    GFR calc non Af Amer 16 (*)    GFR calc Af Amer 19 (*)    Anion gap 16 (*)    All other components within normal limits  CBC WITH DIFFERENTIAL/PLATELET - Abnormal; Notable for the following:    RDW 16.2 (*)    Neutrophils Relative % 82 (*)    Neutro Abs 8.4 (*)    Lymphocytes Relative 11 (*)    All other components within normal limits  URINALYSIS, ROUTINE W REFLEX MICROSCOPIC (NOT AT [facility]) - Abnormal; Notable for the following:    APPearance HAZY (*)    Bilirubin Urine SMALL (*)    Leukocytes, UA MODERATE (*)    All other components within normal limits  BRAIN NATRIURETIC PEPTIDE - Abnormal; Notable for the following:    B Natriuretic Peptide >4500.0 (*)    All other components within normal limits  URINE MICROSCOPIC-ADD ON - Abnormal; Notable for the following:    Squamous Epithelial / LPF FEW (*)    Bacteria, UA FEW (*)    Casts HYALINE CASTS (*)    All other components within normal limits  LACTIC ACID, PLASMA - Abnormal; Notable for the following:    Lactic Acid, Venous 3.3 (*)    All other components within normal limits  GLUCOSE, CAPILLARY - Abnormal; Notable for the following:    Glucose-Capillary 169 (*)    All other components within normal limits  I-STAT TROPOININ, ED -  Abnormal; Notable for the following:    Troponin i, poc 0.11 (*)    All other components within normal limits  I-STAT CG4 LACTIC ACID, ED - Abnormal; Notable for the following:    Lactic Acid, Venous 5.05 (*)    All other components within normal limits  MRSA PCR SCREENING  URINE CULTURE  CULTURE, BLOOD (ROUTINE X 2)  CULTURE, BLOOD (ROUTINE X 2)  AMMONIA  HEPATIC FUNCTION PANEL  BASIC METABOLIC PANEL  CBC WITH DIFFERENTIAL/PLATELET    Imaging Review Dg Chest 2 View  03/16/2015   CLINICAL DATA:  Acute onset of chest pain and shortness of breath. Cough and congestion. Initial encounter.  EXAM: CHEST  2 VIEW  COMPARISON:  Chest radiograph performed 12/14/2014  FINDINGS: The lungs are well-aerated. A small left pleural effusion is noted. Chronically increased interstitial markings are noted. Mild peribronchial thickening is noted. No pneumothorax is identified. Scarring is seen at the left lung apex.  The heart is enlarged. The patient is status post median sternotomy. A valve replacement is noted. Orphaned pacemaker leads are visualized. No acute osseous abnormalities are seen.  IMPRESSION: Small left pleural effusion noted. Chronically increased interstitial markings seen. Cardiomegaly.   Electronically Signed   By: Roanna Raider M.D.   On: 03/16/2015 17:26     EKG Interpretation   Date/Time:  Friday Mar 16 2015 15:50:42 EDT Ventricular Rate:  89 PR Interval:  157 QRS Duration: 195 QT Interval:  449 QTC Calculation: 546 R Axis:   -82 Text Interpretation:  Ectopic atrial rhythm Probable left atrial  enlargement Left bundle branch block Baseline wander in lead(s) I No  significant change since last tracing Confirmed by Lincoln Brigham [phone]) on  03/16/2015 4:10:26 PM      MDM   Final diagnoses:  Acute renal failure, unspecified acute renal failure type  Hyperkalemia    Patient here for evaluation of nausea and chills. Patient has chronic heart failure and does have some edema and  JVD on examination but overall appears dehydrated. Discussed with Dr. Clarise Cruz with Cardiology, who recommends admission to medicine service for treatment of acute kidney injury with hyperkalemia. Patient has a left bundle branch block on EKG, this was present on priors. Delay in obtaining a urinalysis, treated for possible UTI pending UA results. D/w Dr. Robb Matar regarding admission and he recommends Cardiology eval given heart failure. Discussed with cardiology again (Dr. Tenny Craw) and medicine admission recommended. Discussed the case with critical care and recommended medicine admission. Discussed with hospitalist (Dr. Toniann Fail) regarding admission.    Tilden Fossa, MD 03/17/15 [phone]

## 2015-03-16 NOTE — ED Notes (Signed)
Patient transported to X-ray 

## 2015-03-17 ENCOUNTER — Inpatient Hospital Stay (HOSPITAL_COMMUNITY): Payer: Medicare Other

## 2015-03-17 DIAGNOSIS — N179 Acute kidney failure, unspecified: Secondary | ICD-10-CM

## 2015-03-17 DIAGNOSIS — R570 Cardiogenic shock: Secondary | ICD-10-CM

## 2015-03-17 DIAGNOSIS — I5043 Acute on chronic combined systolic (congestive) and diastolic (congestive) heart failure: Secondary | ICD-10-CM | POA: Diagnosis present

## 2015-03-17 DIAGNOSIS — E872 Acidosis, unspecified: Secondary | ICD-10-CM | POA: Insufficient documentation

## 2015-03-17 DIAGNOSIS — E875 Hyperkalemia: Secondary | ICD-10-CM

## 2015-03-17 DIAGNOSIS — R740 Nonspecific elevation of levels of transaminase and lactic acid dehydrogenase [LDH]: Secondary | ICD-10-CM

## 2015-03-17 DIAGNOSIS — I5021 Acute systolic (congestive) heart failure: Secondary | ICD-10-CM

## 2015-03-17 DIAGNOSIS — E162 Hypoglycemia, unspecified: Secondary | ICD-10-CM

## 2015-03-17 DIAGNOSIS — Z954 Presence of other heart-valve replacement: Secondary | ICD-10-CM

## 2015-03-17 DIAGNOSIS — I480 Paroxysmal atrial fibrillation: Secondary | ICD-10-CM

## 2015-03-17 DIAGNOSIS — N189 Chronic kidney disease, unspecified: Secondary | ICD-10-CM

## 2015-03-17 LAB — GLUCOSE, CAPILLARY
GLUCOSE-CAPILLARY: 144 mg/dL — AB (ref 65–99)
GLUCOSE-CAPILLARY: 83 mg/dL (ref 65–99)
Glucose-Capillary: 28 mg/dL — CL (ref 65–99)
Glucose-Capillary: 58 mg/dL — ABNORMAL LOW (ref 65–99)
Glucose-Capillary: 81 mg/dL (ref 65–99)
Glucose-Capillary: 154 mg/dL — ABNORMAL HIGH (ref 65–99)
Glucose-Capillary: 88 mg/dL (ref 65–99)
Glucose-Capillary: 40 mg/dL — CL (ref 65–99)
Glucose-Capillary: 61 mg/dL — ABNORMAL LOW (ref 65–99)
Glucose-Capillary: 106 mg/dL — ABNORMAL HIGH (ref 65–99)
Glucose-Capillary: 189 mg/dL — ABNORMAL HIGH (ref 65–99)

## 2015-03-17 LAB — CBC WITH DIFFERENTIAL/PLATELET
BASOS ABS: 0 10*3/uL (ref 0.0–0.1)
BASOS PCT: 0 % (ref 0–1)
EOS ABS: 0 10*3/uL (ref 0.0–0.7)
EOS PCT: 0 % (ref 0–5)
HCT: 39.3 % (ref 36.0–46.0)
Hemoglobin: 12.7 g/dL (ref 12.0–15.0)
Lymphocytes Relative: 13 % (ref 12–46)
Lymphs Abs: 1.5 10*3/uL (ref 0.7–4.0)
MCH: 30.8 pg (ref 26.0–34.0)
MCHC: 32.3 g/dL (ref 30.0–36.0)
MCV: 95.2 fL (ref 78.0–100.0)
Monocytes Absolute: 0.8 10*3/uL (ref 0.1–1.0)
Monocytes Relative: 7 % (ref 3–12)
Neutro Abs: 9.9 10*3/uL — ABNORMAL HIGH (ref 1.7–7.7)
Neutrophils Relative %: 80 % — ABNORMAL HIGH (ref 43–77)
Platelets: 275 10*3/uL (ref 150–400)
RBC: 4.13 MIL/uL (ref 3.87–5.11)
RDW: 16.3 % — ABNORMAL HIGH (ref 11.5–15.5)
WBC: 12.3 10*3/uL — ABNORMAL HIGH (ref 4.0–10.5)

## 2015-03-17 LAB — BASIC METABOLIC PANEL
Anion gap: 14 (ref 5–15)
BUN: 69 mg/dL — ABNORMAL HIGH (ref 6–20)
CO2: 29 mmol/L (ref 22–32)
CREATININE: 2.49 mg/dL — AB (ref 0.44–1.00)
Calcium: 9.5 mg/dL (ref 8.9–10.3)
Chloride: 95 mmol/L — ABNORMAL LOW (ref 101–111)
GFR calc non Af Amer: 17 mL/min — ABNORMAL LOW (ref >60)
GFR, EST AFRICAN AMERICAN: 20 mL/min — AB (ref >60)
GLUCOSE: 146 mg/dL — AB (ref 65–99)
Potassium: 4.5 mmol/L (ref 3.5–5.1)
Sodium: 138 mmol/L (ref 135–145)

## 2015-03-17 LAB — CARBOXYHEMOGLOBIN
CARBOXYHEMOGLOBIN: 1.5 % (ref 0.5–1.5)
METHEMOGLOBIN: 1.1 % (ref 0.0–1.5)
O2 Saturation: 65.5 %
Total hemoglobin: 11.2 g/dL — ABNORMAL LOW (ref 12.0–16.0)

## 2015-03-17 LAB — HEPATIC FUNCTION PANEL
ALK PHOS: 148 U/L — AB (ref 38–126)
ALT: 183 U/L — ABNORMAL HIGH (ref 14–54)
AST: 311 U/L — ABNORMAL HIGH (ref 15–41)
Albumin: 3.2 g/dL — ABNORMAL LOW (ref 3.5–5.0)
BILIRUBIN TOTAL: 2.4 mg/dL — AB (ref 0.3–1.2)
Bilirubin, Direct: 1.2 mg/dL — ABNORMAL HIGH (ref 0.1–0.5)
Indirect Bilirubin: 1.2 mg/dL — ABNORMAL HIGH (ref 0.3–0.9)
Total Protein: 5.6 g/dL — ABNORMAL LOW (ref 6.5–8.1)

## 2015-03-17 LAB — TROPONIN I
TROPONIN I: 0.05 ng/mL — AB (ref <0.031)
TROPONIN I: 0.05 ng/mL — AB (ref <0.031)
Troponin I: 0.06 ng/mL — ABNORMAL HIGH (ref <0.031)

## 2015-03-17 LAB — SODIUM, URINE, RANDOM: SODIUM UR: 102 mmol/L

## 2015-03-17 LAB — LACTIC ACID, PLASMA: Lactic Acid, Venous: 1 mmol/L (ref 0.5–2.0)

## 2015-03-17 MED ORDER — DEXTROSE 50 % IV SOLN
INTRAVENOUS | Status: AC
  Administered 2015-03-17: 08:00:00
  Filled 2015-03-17: qty 50

## 2015-03-17 MED ORDER — FUROSEMIDE 10 MG/ML IJ SOLN
40.0000 mg | Freq: Two times a day (BID) | INTRAMUSCULAR | Status: DC
  Administered 2015-03-17: 40 mg via INTRAVENOUS
  Filled 2015-03-17 (×3): qty 4

## 2015-03-17 MED ORDER — MILRINONE IN DEXTROSE 20 MG/100ML IV SOLN
0.1250 ug/kg/min | INTRAVENOUS | Status: DC
  Administered 2015-03-17 – 2015-03-22 (×7): 0.25 ug/kg/min via INTRAVENOUS
  Administered 2015-03-23 – 2015-03-25 (×2): 0.125 ug/kg/min via INTRAVENOUS
  Filled 2015-03-17 (×10): qty 100

## 2015-03-17 MED ORDER — METOPROLOL TARTRATE 1 MG/ML IV SOLN
2.5000 mg | Freq: Once | INTRAVENOUS | Status: AC
  Administered 2015-03-17: 2.5 mg via INTRAVENOUS
  Filled 2015-03-17: qty 5

## 2015-03-17 MED ORDER — SODIUM CHLORIDE 0.9 % IV SOLN: INTRAVENOUS | Status: DC

## 2015-03-17 MED ORDER — FUROSEMIDE 10 MG/ML IJ SOLN: 20.0000 mg | Freq: Two times a day (BID) | INTRAMUSCULAR | Status: DC

## 2015-03-17 MED ORDER — DEXTROSE 50 % IV SOLN
25.0000 mL | Freq: Once | INTRAVENOUS | Status: AC
  Administered 2015-03-17 (×2): 25 mL via INTRAVENOUS

## 2015-03-17 MED ORDER — DEXTROSE 50 % IV SOLN
INTRAVENOUS | Status: AC
  Administered 2015-03-17: 25 mL via INTRAVENOUS
  Filled 2015-03-17: qty 50

## 2015-03-17 MED ORDER — DEXTROSE 10 % IV SOLN
INTRAVENOUS | Status: DC
Start: 2015-03-17 — End: 2015-03-18
  Administered 2015-03-17: 07:00:00 via INTRAVENOUS

## 2015-03-17 MED ORDER — CEFTRIAXONE SODIUM IN DEXTROSE 20 MG/ML IV SOLN
1.0000 g | Freq: Every day | INTRAVENOUS | Status: AC
  Administered 2015-03-17 – 2015-03-23 (×7): 1 g via INTRAVENOUS
  Filled 2015-03-17 (×7): qty 50

## 2015-03-17 MED ORDER — INSULIN GLARGINE 100 UNIT/ML ~~LOC~~ SOLN
10.0000 [IU] | Freq: Every day | SUBCUTANEOUS | Status: DC
  Administered 2015-03-17 – 2015-03-29 (×13): 10 [IU] via SUBCUTANEOUS
  Filled 2015-03-17 (×14): qty 0.1

## 2015-03-17 NOTE — Progress Notes (Deleted)
Patient diaphoretic, lethargic but awakens to verbal stimuli. CBG 28. Patient currently NPO for procedure this am. 1/2 amp D50 given.

## 2015-03-17 NOTE — Progress Notes (Signed)
Utilization review completed.  

## 2015-03-17 NOTE — Progress Notes (Deleted)
Repeat CBG 81. Patient states she feels some better. Somnolent at this time. Benedetto Coons notified.

## 2015-03-17 NOTE — Procedures (Signed)
Central Venous Catheter Insertion Procedure Note Joanna [name] [id number] 13-Jan-1930  Procedure: Insertion of Central Venous Catheter Indications: Assessment of intravascular volume and Drug and/or fluid administration  Procedure Details Consent: Risks of procedure as well as the alternatives and risks of each were explained to the (patient/caregiver).  Consent for procedure obtained. Time Out: Verified patient identification, verified procedure, site/side was marked, verified correct patient position, special equipment/implants available, medications/allergies/relevent history reviewed, required imaging and test results available.  Performed  Maximum sterile technique was used including antiseptics, cap, gloves, gown, hand hygiene, mask and sheet. Skin prep: Chlorhexidine; local anesthetic administered A antimicrobial bonded/coated triple lumen catheter was placed in the right internal jugular vein using the Seldinger technique.  Ultrasound was used to verify the patency of the vein and for real time needle guidance.  Evaluation Blood flow good Complications: No apparent complications Patient did tolerate procedure well. Chest X-ray ordered to verify placement.  CXR: pending.  Jeralyn Nolden 03/17/2015, 12:18 PM

## 2015-03-17 NOTE — Progress Notes (Signed)
eLink Physician-Brief Progress Note Patient Name: Joanna [name] DOB: [birth year] MRN: [id number]   Date of Service  03/17/2015  HPI/Events of Note  Patient received Lantus 20 units [place] last evening and had hypoglycemic episode this AM with blood glucose = 28.  eICU Interventions  Will decrease Lantus to 10 units [place] this evening.      Intervention Category Intermediate Interventions: Hyperglycemia - evaluation and treatment  Sommer,Steven Dennard Nip 03/17/2015, 9:35 PM

## 2015-03-17 NOTE — Progress Notes (Signed)
Patient having increasing frequency of PACs, PVCs, and nonsustained runs of SVT. HR ranging from 110-123. Joanna [name] notified, orders obtained. Patient denies shortness of breath. BP stable. Patient only complains of feeling flushed at this time. Cool wet cloth placed on patients forehead at this time. Will monitor.

## 2015-03-17 NOTE — Consult Note (Addendum)
CARDIOLOGY CONSULT NOTE   Patient ID: Joanna [name] MRN: [id number], DOB/AGE: 79-17-1931   Admit date: 03/16/2015 Date of Consult: 03/17/2015  Primary Physician: Sanda Linger, MD Primary Cardiologist: Dr Gala Romney  Reason for consult:  Cardiogenic shock  Problem List  Past Medical History  Diagnosis Date  . Peripheral artery disease   . Hyperlipidemia     takes Simvastatin daily  . PONV (postoperative nausea and vomiting)   . Shortness of breath   . Arthritis   . Aortic stenosis 09/21/2011  . AORTIC VALVE SCLEROSIS 02/21/2010  . 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 BUNDLE BRANCH BLOCK, LEFT 07/30/2007  . CAROTID ARTERY STENOSIS 01/05/2009  . CVA 03/14/2010  . 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 PERIPHERAL VASCULAR DISEASE 07/30/2007  . POLYPECTOMY, HX OF 07/30/2007  . Constipation   . Chronic combined systolic and diastolic CHF (congestive heart failure)     a. RHC 09/2011 RA 8, RV 58/2/11; PA 54/22 (37) PCWP 20; F CO/CI 4.57/2.67 PVR 3.7; b. L main no sig dz, LAD luminal irreg; LCx lg Ramus with luminal irreg, small AV Lcx witout sig dz, RCA luminal irreg; severe mitral annular calcifications; AV calcified c. EF 45-50% (07/2013);  d. 11/2014 Echo: EF 20-25%, diff HK, nl AV/MV, sev dil LA, mod TR/PR, PASP .  . Diabetes mellitus   . DM 07/30/2007  . HTN (hypertension)   . HYPERTENSION 07/30/2007  . Weakness   . Dizziness   . Joint pain   . Itching   . Anemia   . Iron deficiency anemia 09/21/2011  . Nocturia   . History of blood transfusion   . History of shingles   . Mitral regurgitation   . S/P aortic valve replacement with bioprosthetic valve 06/15/2013    21 mm [facility] Ease bovine pericardial tissue valve  . S/P mitral valve replacement with bioprosthetic valve 06/15/2013    25 mm [facility] Mitral bovine pericardial tissue valve  . Fracture, fibula, shaft 01/30/2015    right    Past Surgical History  Procedure Laterality Date  . Carotid endarterectomy  Left 2011  . Polypectomy  12/27/04  . Cataract extraction Bilateral 2009  . Tee without cardioversion N/A 05/19/2013    Procedure: TRANSESOPHAGEAL ECHOCARDIOGRAM (TEE);  Surgeon: Laurey Morale, MD;  Location: [facility] ENDOSCOPY;  Service: Cardiovascular;  Laterality: N/A;  . Colonoscopy    . Cardiac catheterization  2012  . Aortic valve replacement N/A 06/15/2013    Procedure: AORTIC VALVE REPLACEMENT (AVR);  Surgeon: Purcell Nails, MD;  [facility];  Service: Open Heart Surgery;  Laterality: N/A;  . Intraoperative transesophageal echocardiogram N/A 06/15/2013    Procedure: INTRAOPERATIVE TRANSESOPHAGEAL ECHOCARDIOGRAM;  Surgeon: Purcell Nails, MD;  Location: [facility] OR;  Service: Open Heart Surgery;  Laterality: N/A;  . Mitral valve replacement N/A 06/15/2013    Procedure: MITRAL VALVE (MV) REPLACEMENT;  Surgeon: Purcell Nails, MD;  Location: [facility] OR;  Service: Open Heart Surgery;  Laterality: N/A;  . Epicardial pacing lead placement N/A 06/15/2013    Procedure: EPICARDIAL PACING LEAD PLACEMENT;  Surgeon: Purcell Nails, MD;  Location: [facility] OR;  Service: Open Heart Surgery;  Laterality: N/A;  . Left and right heart catheterization with coronary angiogram N/A 09/19/2011    Procedure: LEFT AND RIGHT HEART CATHETERIZATION WITH CORONARY ANGIOGRAM;  Surgeon: Dolores Patty, MD;  Location: [facility] CATH LAB;  Service: Cardiovascular;  Laterality: N/A;  . Orif ankle fracture Right 01/31/2015    Procedure: OPEN REDUCTION INTERNAL FIXATION (ORIF)  ANKLE FRACTURE;  Surgeon: Eldred Manges, MD;  Location: [facility] OR;  Service: Orthopedics;  Laterality: Right;     Allergies  Allergies  Allergen Reactions  . Other Hives    STEROIDS  . Prednisone Hives and Other (See Comments)    She is allergic to all steroids!    HPI   Joanna [name] is a 82 year old woman with aortic stenosis s/p AVR/MVR (06/15/2013), systolic HF due to NICM, hypertension, diabetes mellitus type II, hyperlipidemia, and peripheral artery disease.  Underwent AVR/MVR with placement of epicardial lead on 06/15/13 with PAF post-op terminated with amiodarone. She is off amiodarone now due to nausea..   Admitted initially in 09/2011 with acute systolic HF, EF 30%. Cath with normal coronaries and normal cardiac output (see below). AS was mild to moderate. In hospital also found to be iron-deficiency anemia. Previous colonoscopies normal. Seen by GI who suggested trial of iron and see if it improved.  05/19/2013 ECHO EF 35% severe AS (low gradient severe AS confirmed by DSE), gradient: 32 mm Hg (S). Peak gradient: 39mm Hg  08/18/13 ECHO EF 45-50% AV working well 2/16 ECHO with EF 20-25%, moderately dilated LV with mild LVH, normal bioprosthetic mitral and aortic valves, moderate TR, PA systolic pressure 54 mmHg.  4/16 ECHO EF 20-25%, diffuse hypokinesis, mild LVH, bioprosthetic aortic valve functioning normally, bioprosthetic mitral valve with mild central MR and mean gradient 4 mmHg.   Had 30 day event monitor in 1/15 without recurrent AF.   Admitted to [facility] 12/15/14 with volume overload. Diuresed with IV lasix and transitioned to lasix 40 mg twice a day. Discharge weight 128 pounds. She went home 2/28 and fell at home. She returned to ED with negative CT scan.   Larey Seat in setting of hyperglycemia in 4/16 and fractured right ankle, admitted and had ORIF right ankle. Hospitalization complicated by volume overload. Now at [facility] nursing home.   She last saw Dr Shirlee Latch in the CHF clinic on 02/21/2015, her weight has been trending, at the visit weight 151 including a cast (from 129 lbs), obtained im lasix at the clinic and her lasix was increased 40 -> 60 mg PO BID and metolazone was added on Tuesdays and Fridays.  She was readmitted yesterday with an episode of nausea/vomiting, poor oral intake, chronic SOB. She was found to be fluid overloaded (145 lbs) with acute on chronic kidney failure. Baseline crea 1.68 --> 2.58.  She was started on milrinone drip.     Inpatient Medications  . aspirin EC  81 mg Oral Daily  . calcium-vitamin D  1 tablet Oral Q breakfast  . cefTRIAXone (ROCEPHIN)  IV  1 g Intravenous Daily  . docusate sodium  100 mg Oral BID  . enoxaparin (LOVENOX) injection  30 mg Subcutaneous QHS  . ferrous sulfate  325 mg Oral BID WC  . insulin aspart  0-9 Units Subcutaneous TID WC  . insulin glargine  20 Units Subcutaneous QHS  . linagliptin  5 mg Oral Daily   . dextrose 10 mL/hr at 03/17/15 0647  . milrinone 0.25 mcg/kg/min (03/17/15 0834)   Family History  Family History  Problem Relation Age of Onset  . Colon cancer Mother   . Diabetes Neg Hx   . Coronary artery disease Neg Hx     Social History  History   Social History  . Marital Status: Divorced    Spouse Name: N/A  . Number of Children: N/A  . Years of Education: N/A  Occupational History  . Not on file.   Social History Main Topics  . Smoking status: Never Smoker   . Smokeless tobacco: Never Used  . Alcohol Use: No  . Drug Use: No  . Sexual Activity: No   Other Topics Concern  . Not on file   Social History Narrative   Patient lives alone here in town   She continues to work, helping to bind and oversew books   She is divorced after 23 years of marriage   1 son- '61, minister; 1 dtr '55; 4 grandchildren   End of life care-provided packet on living well    Review of Systems  General:  No chills, fever, night sweats or weight changes.  Cardiovascular:  No chest pain, dyspnea on exertion, edema, orthopnea, palpitations, paroxysmal nocturnal dyspnea. Dermatological: No rash, lesions/masses Respiratory: No cough, dyspnea Urologic: No hematuria, dysuria Abdominal:   No nausea, vomiting, diarrhea, bright red blood per rectum, melena, or hematemesis Neurologic:  No visual changes, wkns, changes in mental status. All other systems reviewed and are otherwise negative except as noted above.  Physical Exam  Blood pressure 112/85, pulse 84,  temperature 98.2 F (36.8 C), temperature source Oral, resp. rate 14, height  (1.626 m), weight 145 lb 11.6 oz (66.1 kg), SpO2 88 %.  General: sleeping, drowsy Neuro:  Moves all extremities spontaneously. HEENT: Normal  Neck: JVD up to the jaws, pulsatile Lungs:  Resp regular and unlabored, crackles at the basis, minimal air exchange. Heart: RRR, 3/6 systolic murmur, loud S3 gallop. Abdomen: Soft, non-tender, non-distended, BS + x 4.  Extremities: R leg - shin cast, left leg mild non-pitting edema  Labs  Recent Labs  03/17/15 0147 03/17/15 0649  TROPONINI 0.05* 0.05*   Lab Results  Component Value Date   WBC 12.3* 03/17/2015   HGB 12.7 03/17/2015   HCT 39.3 03/17/2015   MCV 95.2 03/17/2015   PLT 275 03/17/2015    Recent Labs Lab 03/17/15 0147  NA 138  K 4.5  CL 95*  CO2 29  BUN 69*  CREATININE 2.49*  CALCIUM 9.5  PROT 5.6*  BILITOT 2.4*  ALKPHOS 148*  ALT 183*  AST 311*  GLUCOSE 146*   Lab Results  Component Value Date   CHOL 169 12/25/2014   HDL 54.70 12/25/2014   LDLCALC 93 12/25/2014   TRIG 108.0 12/25/2014   Radiology/Studies  Dg Chest 2 View  03/16/2015   CLINICAL DATA:  Acute onset of chest pain and shortness of breath. Cough and congestion. Initial encounter.   IMPRESSION: Small left pleural effusion noted. Chronically increased interstitial markings seen. Cardiomegaly.   US Abdomen Complete  03/17/2015   CLINICAL DATA:  Nausea and vomiting    IMPRESSION: Increased renal cortical echogenicity suggesting medical renal disease, with right renal cysts.  Bladder distention.  Gallbladder wall thickening, pericholecystic fluid, and adherent sludge balls or polyps. If the patient has right upper quadrant abdominal pain, findings would be highly suspicious for acute cholecystitis. However, there is ascites and gallbladder wall thickening may also be secondary to liver disease or hypoproteinemia.  Pleural effusions and ascites.   Echocardiogram -  01/31/2015 - Left ventricle: The cavity size was severely dilated. Wall thickness was increased in a pattern of mild LVH. Systolic function was severely reduced. The estimated ejection fraction was in the range of 20% to 25%. Diffuse hypokinesis. - Aortic valve: Bioprosthetic Aortic valve with no signs of AS or signficant perivalvular regurgitation. - Mitral valve: Bioporsthetic Mitral Valve  with mild central MR and thickened leaflets Valve area by continuity equation (using LVOT flow): 0.48 cm^2. - Left atrium: The atrium was moderately to severely dilated. - Right ventricle: The cavity size was mildly dilated. - Right atrium: The atrium was mildly dilated. - Tricuspid valve: There was moderate regurgitation. - Systolic pressure was moderately increased. PA peak pressure: 54 mm Hg (S).  ECG: SR, LBBB    ASSESSMENT AND PLAN  1. Acute on chronic systolic CHF - stage 4, started on Milrinone this am at 0.25 mcg/kg/minute,  Weight 145, baseline 129 + cast, significant fluid overload on physical exam.  - diuretics are held, she diuresed 0.8 Liters overnight, we will restart low dose lasix iv 20 mg BID - we will ask PPCM to place a central line - losartan held - EC shows LBBB, If EF does not recovery, CRT could be considered.   2. Acute on chronic kidney failure - as above started on milrinone, low dose of diuretics, we will monitor closely  3. Carotid stenosis: 40-59% RICA stenosis, repeat carotids in 3/17.  4. Hyperlipidemia: LDL 93 in 3/16, would not change statin.  5. Aortic stenosis/mitral regurgitation: s/p bioprosthetic AVR/MVR. Well-seated valves on recent echo 6. Post-op AF: No evidence of recurrence.    Signed, Lars Masson, MD, [facility] 03/17/2015, 10:09 AM   Addendum: we have obtained CVP 11, Co-ox 65%, we will start lasix 40 mg iv BID. Also order a non-contrast head CT as she is sleeping and non responsive.  Lars Masson 03/17/2015

## 2015-03-17 NOTE — Progress Notes (Signed)
Hypoglycemic Event  CBG: 28  Treatment: 1/2 amp D50  Symptoms: diaphoretic, lethargic  Follow-up CBG: Time:0630 CBG Result:81  Possible Reasons for Event: Received Lantus 20 units  Comments/MD notified:T. Delma Freeze, Devonne Doughty Dawne  Remember to initiate Hypoglycemia Order Set & complete

## 2015-03-17 NOTE — Consult Note (Signed)
PULMONARY / CRITICAL CARE MEDICINE   Name: Joanna [name] MRN: [id number] DOB: [birth year]    ADMISSION DATE:  03/16/2015 CONSULTATION DATE:  03/17/2015  REFERRING MD :  [facility]ist service  CHIEF COMPLAINT:  Shortness of breath, nausea and vomiting  INITIAL PRESENTATION: 81 year old female with non-ischemic cardiomyopathy was admitted on 03/17/2015 with an acute on chronic CHF exacerbation with acute kidney injury.  STUDIES:  03/17/2015 ultrasound of the right upper quadrant> gallbladder wall thickening with some sludge, notable ascites and gallbladder wall thickening  SIGNIFICANT EVENTS:    HISTORY OF PRESENT ILLNESS:  This is an 81 year old female followed by the advanced heart failure service for non-ischemic cardiomyopathy who has a history of any or aortic and mitral valve replacement in 2014 was admitted on 03/16/2015 from a nursing facility after having nausea vomiting, and poor by mouth intake. She was found to be volume overloaded and acute kidney injury with an elevated lactic acid. It was felt that she had cardiogenic shock and so she was admitted to the cardiac intensive care unit. Cardiology was consulted and recommended milrinone and perhaps more basal pressors. They have requested a central line. The patient was encephalopathic at the time of my exam and so no further history could be attained at separate from reading the records in the chart. Her son says that she has not been interactive with him since arrival.  PAST MEDICAL HISTORY :   has a past medical history of Peripheral artery disease; Hyperlipidemia; PONV (postoperative nausea and vomiting); Shortness of breath; Arthritis; Aortic stenosis (09/21/2011); AORTIC VALVE SCLEROSIS (02/21/2010); Arthus phenomenon (09/17/2007); BUNDLE BRANCH BLOCK, LEFT (07/30/2007); CAROTID ARTERY STENOSIS (01/05/2009); CVA (03/14/2010); Hyperchylomicronemia (09/17/2007); PERIPHERAL VASCULAR DISEASE (07/30/2007); POLYPECTOMY, HX OF  (07/30/2007); Constipation; Chronic combined systolic and diastolic CHF (congestive heart failure); Diabetes mellitus; DM (07/30/2007); HTN (hypertension); HYPERTENSION (07/30/2007); Weakness; Dizziness; Joint pain; Itching; Anemia; Iron deficiency anemia (09/21/2011); Nocturia; History of blood transfusion; History of shingles; Mitral regurgitation; S/P aortic valve replacement with bioprosthetic valve (06/15/2013); S/P mitral valve replacement with bioprosthetic valve (06/15/2013); and Fracture, fibula, shaft (01/30/2015).  has past surgical history that includes Carotid endarterectomy (Left, 2011); Polypectomy (12/27/04); Cataract extraction (Bilateral, 2009); TEE without cardioversion (N/A, 05/19/2013); Colonoscopy; Cardiac catheterization (2012); Aortic valve replacement (N/A, 06/15/2013); Intraoprative transesophageal echocardiogram (N/A, 06/15/2013); Mitral valve replacement (N/A, 06/15/2013); Epicardial pacing lead placement (N/A, 06/15/2013); left and right heart catheterization with coronary angiogram (N/A, 09/19/2011); and ORIF ankle fracture (Right, 01/31/2015). Prior to Admission medications   Medication Sig Start Date End Date Taking? Authorizing Provider  acetaminophen (TYLENOL) 325 MG tablet Take 325 mg by mouth every 6 (six) hours as needed for mild pain.   Yes Historical Provider, MD  aspirin EC 81 MG tablet Take 1 tablet (81 mg total) by mouth daily. 07/10/14  Yes Dolores Patty, MD  calcium citrate-vitamin D (CITRACAL+D) 315-200 MG-UNIT per tablet Take 1 tablet by mouth daily.   Yes Historical Provider, MD  docusate sodium (COLACE) 100 MG capsule Take 100 mg by mouth 2 (two) times daily.     Yes Historical Provider, MD  ferrous sulfate 325 (65 FE) MG tablet Take 1 tablet (325 mg total) by mouth 2 (two) times daily with a meal. 02/28/14  Yes Etta Grandchild, MD  furosemide (LASIX) 80 MG tablet Take 80 mg by mouth 2 (two) times daily.   Yes Historical Provider, MD  HYDROcodone-acetaminophen  (NORCO/VICODIN) 5-325 MG per tablet Take 1-2 tablets by mouth every 4 (four) hours as needed (breakthrough pain). 02/02/15  Yes Jerald Kief, MD  insulin aspart (NOVOLOG) 100 UNIT/ML FlexPen Inject 3 Units into the skin 3 (three) times daily with meals. 02/02/15  Yes Jerald Kief, MD  Insulin Glargine (TOUJEO SOLOSTAR) 300 UNIT/ML SOPN Inject 20 Units into the skin daily. 02/02/15  Yes Jerald Kief, MD  JANUVIA 50 MG tablet Take 50 mg by mouth daily. 10/28/14  Yes Historical Provider, MD  metolazone (ZAROXOLYN) 5 MG tablet Take 1 tablet (5 mg total) by mouth 3 (three) times a week. Monday Wednesday and Friday 03/13/15  Yes Laurey Morale, MD  metolazone (ZAROXOLYN) 5 MG tablet Take 5 mg by mouth See admin instructions. Take on Monday Wednesday and Friday (Give Prior to lasix)   Yes Historical Provider, MD  ondansetron (ZOFRAN) 4 MG tablet Take 4 mg by mouth every 4 (four) hours as needed for nausea or vomiting.   Yes Historical Provider, MD  potassium chloride SA (K-DUR,KLOR-CON) 20 MEQ tablet Take 40 mEq by mouth See admin instructions. Take on Monday Wednesday and Fridays when metolazone is administered.   Yes Historical Provider, MD  potassium chloride SA (K-DUR,KLOR-CON) 20 MEQ tablet Take 60 mEq by mouth 2 (two) times daily.   Yes Historical Provider, MD  promethazine (PHENERGAN) 25 MG tablet Take 25 mg by mouth every 12 (twelve) hours as needed for nausea or vomiting.   Yes Historical Provider, MD  simvastatin (ZOCOR) 20 MG tablet Take 1 tablet (20 mg total) by mouth every evening. 02/28/14  Yes Etta Grandchild, MD  furosemide (LASIX) 40 MG tablet Take 2 tablets (80 mg total) by mouth 2 (two) times daily. Patient not taking: Reported on 03/16/2015 03/13/15   Laurey Morale, MD  Insulin Pen Needle (NOVOFINE) 32G X 6 MM MISC Use daily with insulin 12/29/14   Etta Grandchild, MD  potassium chloride SA (K-DUR,KLOR-CON) 20 MEQ tablet Take 3 tablets (60 mEq total) by mouth 2 (two) times daily. Take 3 extra  tabs (60 MEQ) on Mon Wed and Fri with Metolazone Patient not taking: Reported on 03/16/2015 03/15/15   Laurey Morale, MD   Allergies  Allergen Reactions  . Other Hives    STEROIDS  . Prednisone Hives and Other (See Comments)    She is allergic to all steroids!    FAMILY HISTORY:  indicated that her mother is deceased.  SOCIAL HISTORY:  reports that she has never smoked. She has never used smokeless tobacco. She reports that she does not drink alcohol or use illicit drugs.  REVIEW OF SYSTEMS:  Cannot obtain due to encephalopathy  SUBJECTIVE:   VITAL SIGNS: Temp:  [96.2 F (35.7 C)-99 F (37.2 C)] 97.9 F (36.6 C) (05/28 1124) Pulse Rate:  [27-109] 84 (05/28 0730) Resp:  [14-28] 17 (05/28 1124) BP: (90-167)/(43-123) 109/73 mmHg (05/28 1124) SpO2:  [88 %-100 %] 100 % (05/28 1124) Weight:  [56.246 kg (124 lb)-66.5 kg (146 lb 9.7 oz)] 66.1 kg (145 lb 11.6 oz) (05/28 0435) HEMODYNAMICS:   VENTILATOR SETTINGS:   INTAKE / OUTPUT:  Intake/Output Summary (Last 24 hours) at 03/17/15 1139 Last data filed at 03/17/15 8850  Gross per 24 hour  Intake 693.75 ml  Output   1450 ml  Net -756.25 ml    PHYSICAL EXAMINATION: General:  Lying in bed, breathing comfortably  Neuro:  Somnolent, stirs to touch, not verbal or interactive HEENT:  NCAT, pupils equal round reactive to light Cardiovascular:  Regular rate and rhythm, systolic murmur noted, JVD elevated Lungs:  Few  crackles in the bases bilaterally Abdomen:  Nontender to my exam, bowel sounds positive Musculoskeletal:  Diminished bulk and tone Skin:  No skin breakdown or rash noted  LABS:  CBC  Recent Labs Lab 03/16/15 1619 03/17/15 0147  WBC 10.3 12.3*  HGB 14.5 12.7  HCT 45.9 39.3  PLT 315 275   Coag's No results for input(s): APTT, INR in the last 168 hours. BMET  Recent Labs Lab 03/13/15 1028 03/16/15 1619 03/17/15 0147  NA 136 132* 138  K 3.2* 7.5* 4.5  CL 92* 92* 95*  CO2 BUN 57* 69* 69*   CREATININE 1.76* 2.58* 2.49*  GLUCOSE 181* 166* 146*   Electrolytes  Recent Labs Lab 03/13/15 1028 03/16/15 1619 03/17/15 0147  CALCIUM 8.8* 9.6 9.5   Sepsis Markers  Recent Labs Lab 03/16/15 1825 03/16/15 2244  LATICACIDVEN 5.05* 3.3*   ABG No results for input(s): PHART, PCO2ART, PO2ART in the last 168 hours. Liver Enzymes  Recent Labs Lab 03/16/15 1619 03/17/15 0147  AST 154* 311*  ALT 92* 183*  ALKPHOS 146* 148*  BILITOT 3.2* 2.4*  ALBUMIN 3.7 3.2*   Cardiac Enzymes  Recent Labs Lab 03/17/15 0147 03/17/15 0649  TROPONINI 0.05* 0.05*   Glucose  Recent Labs Lab 03/16/15 2229 03/17/15 0619 03/17/15 0632 03/17/15 0727 03/17/15 0741 03/17/15 0816  GLUCAP 169* 28* 81 58* 154* 144*    Imaging 03/16/2015 chest x-ray reviewed> perhaps mild interstitial edema, no frank acute pulmonary edema, cardiomegaly noted, pacing wires noted   ASSESSMENT / PLAN:  PULMONARY A: Shortness of breath in the setting of CHF exacerbation P:   Titrated to as needed for her to saturation greater than 92%  CARDIOVASCULAR CVL right IJ 03/17/2015> A: Presumably acute cardiogenic shock considering known cardiomyopathy and elevated lactic acid, acute on chronic kidney disease and elevated JVD However, differential diagnosis of lactic acid does include ischemic bowel P:  Place central line  Check  SvO2 Repeat lactic acid Continue milrinone Likely need basal pressors Check CVP  RENAL A:  Acute on chronic kidney disease > presumably due to cardiogenic shock P:   Check urinalysis Monitor urine output Serial BMET  GASTROINTESTINAL A:  Abdominal pain, nausea and vomiting > gastroenteritis versus less likely ischemic bowel Biliary sludge noted, physical exam not consistent with acute cholecystitis P:   Repeat lactic acid If not clearly in cardiogenic shock and lactate not clearing then performed CT abdomen to look for scheming bowel MPO  HEMATOLOGIC A:  No  acute issues P:  Monitor for bleeding  INFECTIOUS A:  UTI? P:   BCx2 5/28 >> UC 5/28 >>  Ceftriaxone 5/28 >>  ENDOCRINE A:  Dm2  P:   SSI  NEUROLOGIC A:  Acute encephalopathy presumably due to cardiogenic shock History of CVA P:   Hold sedating meds Treat shock Monitor neurologic status in ICU   FAMILY  - Updates:  Son updated bedside be me  My cc time 45 minutes  Heber Eastman, MD [facility] Pager: [phone] Cell: [phone] After 3pm or if no response, call [phone]    03/17/2015, 11:39 AM

## 2015-03-17 NOTE — Progress Notes (Addendum)
[facility] PROGRESS NOTE Interim History: 81 year old female with past medical history of aortic stenosis status post aortic valve replacement 2014 (bioprosthetic valve) and a mitral bioprosthetic valve 4 chambers dilated, nonischemic systolic heart failure with an ejection fraction of 25% by echo on 01/31/2015, with a chronic left bundle branch block essential hypertension diabetes mellitus type 2, with postoperative paroxysmal atrial fibrillation treated with amiodarone which is stopped, recently seen in the heart failure clinic for chronic heart failure her Lasix was increased along with her metolazone and she was given extra doses of potassium comes into the emergency room for generalized weakness nausea and vomiting. In the emergency room she was found to be a with worsening renal failure, elevated lactic acid, hyperkalemic elevated LFT's and mildly tachycardic, was given IV fluids, with minimal improvement in her renal function and lactic acid. Filed Weights   03/16/15 1506 03/16/15 2000 03/17/15 0435  Weight: 56.246 kg (124 lb) 66.5 kg (146 lb 9.7 oz) 66.1 kg (145 lb 11.6 oz)        Intake/Output Summary (Last 24 hours) at 03/17/15 0720 Last data filed at 03/17/15 0100  Gross per 24 hour  Intake 693.75 ml  Output    450 ml  Net 243.75 ml     Assessment/Plan: Cardiogenic shock/Acute systolic congestive heart failure, NYHA class 4 - She has an ejection fraction of 20%, with new worsening renal failure, elevated lactic acid and  liver enzymes, her BnP is higher compared to previous. I'm concerned about cardiogenic shock. [facility] consult cardiology as she may need dobutamine. Unlikely IV Lasix will help by itself. - She will also need a central line. Check a urinary sodium. - As per family she is a full code. We'll DC IV fluids. - Discussed the patient with the heart failure team and critical care physician and they recommended starting her on milrinone. PCCM was consulted they  will take the patient over their service.  Renal failure (ARF), acute on chronic - Likely due to cardiogenic shock, she was given about a liter of normal saline carotid 12 hour period with minimal improvement in her renal function. - Will probably need IV pressors.  Transaminitis: I think this is due to acute decompensated heart failure unlikely ultrasound to be significant. Ultrasound results are pending. Abdominal CT exam is benign.  Hypoglycemia: - 20 units of Lantus last night with worsening renal function. She is now lethargic, her CBGs was checked at 6 AM and it was 28 she was given half an amp of D50 it came up to 81. A repeated CBG shows a CBC of 58 she was given an additional D50. - Continue gentle D 10.  Hyperkalemia: - Improve after treatment, she's had several bowel movements she was given Kayexalate.  Nausea & vomiting" - Now resolved.  Old left bundle-branch block PAF (paroxysmal atrial fibrillation): - Now with atrial ectopy and a left bundle-branch block.   Code Status: full Family Communication: daughter  Disposition Plan: inpatient unknown discharge date   Consultants:  cardiology  Procedures: ECHO: 01/31/2015 showed an ejection fraction of 20-25% with diffuse hypokinesia and severely dilated left ventricle, with an aortic bioprosthetic valve mitral bioprosthetic valve   Antibiotics:  None  HPI/Subjective: Patient is responsive to pain but still lethargic.  Objective: Filed Vitals:   03/17/15 0610 03/17/15 0620 03/17/15 0640 03/17/15 0645  BP: 167/78 141/89 90/56 110/63  Pulse: 80 54 88 87  Temp:      TempSrc:      Resp: 28  28 16 17   Height:      Weight:      SpO2: 98% 96% 98% 99%     Exam:  General: Alert, awake, oriented x3, in no acute distress.  HEENT: No bruits, no goiter.  JVD to her earlobe Heart: Regular rate and rhythm. Lungs: Good air movement,  Crackles at bases bilaterally. ABdomen: Soft, nontender, nondistended, positive  bowel sounds.  Neuro: Grossly intact, nonfocal.   Data Reviewed: Basic Metabolic Panel:  Recent Labs Lab 03/13/15 1028 03/16/15 1619 03/17/15 0147  NA 136 132* 138  K 3.2* 7.5* 4.5  CL 92* 92* 95*  CO2 30 24 29   GLUCOSE 181* 166* 146*  BUN 57* 69* 69*  CREATININE 1.76* 2.58* 2.49*  CALCIUM 8.8* 9.6 9.5   Liver Function Tests:  Recent Labs Lab 03/16/15 1619 03/17/15 0147  AST 154* 311*  ALT 92* 183*  ALKPHOS 146* 148*  BILITOT 3.2* 2.4*  PROT 6.6 5.6*  ALBUMIN 3.7 3.2*   No results for input(s): LIPASE, AMYLASE in the last 168 hours.  Recent Labs Lab 03/16/15 2244  AMMONIA 31   CBC:  Recent Labs Lab 03/16/15 1619 03/17/15 0147  WBC 10.3 12.3*  NEUTROABS 8.4* 9.9*  HGB 14.5 12.7  HCT 45.9 39.3  MCV 96.2 95.2  PLT 315 275   Cardiac Enzymes:  Recent Labs Lab 03/17/15 0147  TROPONINI 0.05*   BNP (last 3 results)  Recent Labs  02/26/15 1245 03/13/15 1028 03/16/15 1619  BNP 4185.3* 4332.8* >4500.0*    ProBNP (last 3 results) No results for input(s): PROBNP in the last 8760 hours.  CBG:  Recent Labs Lab 03/16/15 2229 03/17/15 0619 03/17/15 0632  GLUCAP 169* 28* 81    Recent Results (from the past 240 hour(s))  MRSA PCR Screening     Status: None   Collection Time: 03/16/15  7:57 PM  Result Value Ref Range Status   MRSA by PCR NEGATIVE NEGATIVE Final    Comment:        The GeneXpert MRSA Assay (FDA approved for NASAL specimens only), is one component of a comprehensive MRSA colonization surveillance program. It is not intended to diagnose MRSA infection nor to guide or monitor treatment for MRSA infections.      Studies: Dg Chest 2 View  03/16/2015   CLINICAL DATA:  Acute onset of chest pain and shortness of breath. Cough and congestion. Initial encounter.  EXAM: CHEST  2 VIEW  COMPARISON:  Chest radiograph performed 12/14/2014  FINDINGS: The lungs are well-aerated. A small left pleural effusion is noted. Chronically  increased interstitial markings are noted. Mild peribronchial thickening is noted. No pneumothorax is identified. Scarring is seen at the left lung apex.  The heart is enlarged. The patient is status post median sternotomy. A valve replacement is noted. Orphaned pacemaker leads are visualized. No acute osseous abnormalities are seen.  IMPRESSION: Small left pleural effusion noted. Chronically increased interstitial markings seen. Cardiomegaly.   Electronically Signed   By: Roanna Raider M.D.   On: 03/16/2015 17:26    Scheduled Meds: . aspirin EC  81 mg Oral Daily  . calcium-vitamin D  1 tablet Oral Q breakfast  . docusate sodium  100 mg Oral BID  . enoxaparin (LOVENOX) injection  30 mg Subcutaneous QHS  . ferrous sulfate  325 mg Oral BID WC  . insulin aspart  0-9 Units Subcutaneous TID WC  . insulin glargine  20 Units Subcutaneous QHS  . linagliptin  5 mg Oral Daily  Continuous Infusions: . dextrose 10 mL/hr at 03/17/15 1478   The patient is critically ill with multiple organ systems failure and requires high complexity decision making for assessment and support, frequent evaluation and titration of therapies, application of advanced monitoring technologies and extensive interpretation of multiple databases.   Critical Care Time devoted to patient care services described in this note is 35 Minutes. This time reflects time of care of with direct care with patient  Marinda Elk  [facility] Pager [phone]. If 7PM-7AM, please contact night-coverage at [email or web address], password [facility] 03/17/2015, 7:20 AM  LOS: 1 day

## 2015-03-18 LAB — BASIC METABOLIC PANEL
Anion gap: 8 (ref 5–15)
Anion gap: 11 (ref 5–15)
BUN: 64 mg/dL — AB (ref 6–20)
BUN: 60 mg/dL — AB (ref 6–20)
CALCIUM: 8.8 mg/dL — AB (ref 8.9–10.3)
CHLORIDE: 92 mmol/L — AB (ref 101–111)
CO2: 37 mmol/L — ABNORMAL HIGH (ref 22–32)
CO2: 36 mmol/L — ABNORMAL HIGH (ref 22–32)
Calcium: 8.9 mg/dL (ref 8.9–10.3)
Chloride: 87 mmol/L — ABNORMAL LOW (ref 101–111)
Creatinine, Ser: 2.29 mg/dL — ABNORMAL HIGH (ref 0.44–1.00)
Creatinine, Ser: 2.18 mg/dL — ABNORMAL HIGH (ref 0.44–1.00)
GFR calc Af Amer: 22 mL/min — ABNORMAL LOW (ref >60)
GFR calc non Af Amer: 20 mL/min — ABNORMAL LOW (ref >60)
GFR, EST AFRICAN AMERICAN: 23 mL/min — AB (ref >60)
GFR, EST NON AFRICAN AMERICAN: 19 mL/min — AB (ref >60)
Glucose, Bld: 169 mg/dL — ABNORMAL HIGH (ref 65–99)
Glucose, Bld: 227 mg/dL — ABNORMAL HIGH (ref 65–99)
Potassium: 3 mmol/L — ABNORMAL LOW (ref 3.5–5.1)
Potassium: 3.6 mmol/L (ref 3.5–5.1)
Sodium: 137 mmol/L (ref 135–145)
Sodium: 134 mmol/L — ABNORMAL LOW (ref 135–145)

## 2015-03-18 LAB — GLUCOSE, CAPILLARY
GLUCOSE-CAPILLARY: 162 mg/dL — AB (ref 65–99)
GLUCOSE-CAPILLARY: 227 mg/dL — AB (ref 65–99)
Glucose-Capillary: 146 mg/dL — ABNORMAL HIGH (ref 65–99)
Glucose-Capillary: 214 mg/dL — ABNORMAL HIGH (ref 65–99)
Glucose-Capillary: 178 mg/dL — ABNORMAL HIGH (ref 65–99)

## 2015-03-18 LAB — CARBOXYHEMOGLOBIN
Carboxyhemoglobin: 1.4 % (ref 0.5–1.5)
Methemoglobin: 0.9 % (ref 0.0–1.5)
O2 Saturation: 66.3 %
Total hemoglobin: 11.2 g/dL — ABNORMAL LOW (ref 12.0–16.0)

## 2015-03-18 LAB — LACTIC ACID, PLASMA: LACTIC ACID, VENOUS: 0.8 mmol/L (ref 0.5–2.0)

## 2015-03-18 MED ORDER — CETYLPYRIDINIUM CHLORIDE 0.05 % MT LIQD
7.0000 mL | Freq: Two times a day (BID) | OROMUCOSAL | Status: DC
  Administered 2015-03-18 – 2015-03-29 (×23): 7 mL via OROMUCOSAL

## 2015-03-18 MED ORDER — FUROSEMIDE 10 MG/ML IJ SOLN
40.0000 mg | Freq: Once | INTRAMUSCULAR | Status: AC
  Administered 2015-03-18: 40 mg via INTRAVENOUS
  Filled 2015-03-18: qty 4

## 2015-03-18 MED ORDER — FUROSEMIDE 10 MG/ML IJ SOLN
12.0000 mg/h | INTRAVENOUS | Status: DC
  Administered 2015-03-18: 10 mg/h via INTRAVENOUS
  Administered 2015-03-20 – 2015-03-21 (×2): 12 mg/h via INTRAVENOUS
  Filled 2015-03-18 (×7): qty 25

## 2015-03-18 MED ORDER — SODIUM CHLORIDE 0.9 % IJ SOLN: 10.0000 mL | INTRAMUSCULAR | Status: DC | PRN

## 2015-03-18 MED ORDER — SODIUM CHLORIDE 0.9 % IJ SOLN
10.0000 mL | Freq: Two times a day (BID) | INTRAMUSCULAR | Status: DC
  Administered 2015-03-18 – 2015-03-30 (×11): 10 mL

## 2015-03-18 MED ORDER — POTASSIUM CHLORIDE CRYS ER 20 MEQ PO TBCR
40.0000 meq | EXTENDED_RELEASE_TABLET | ORAL | Status: AC
  Administered 2015-03-18 (×2): 40 meq via ORAL
  Filled 2015-03-18 (×2): qty 4

## 2015-03-18 NOTE — Progress Notes (Signed)
Patient ID: Joanna [name], female   DOB: Feb 21, 1930, 81 y.o.   MRN: [id number]   SUBJECTIVE: Alert this morning, eating breakfast.    Admitted with volume overload, AKI, low output physiology with elevated lactate.  She was begun on milrinone gtt.    Lasix yesterday with good UOP, weight down 3 lbs.  CVP 16 this morning with co-ox 66%.    Filed Vitals:   03/18/15 0500 03/18/15 0600 03/18/15 0700 03/18/15 0800  BP: 94/59 96/39 114/58 84/58  Pulse: 41 48 97 102  Temp:   98.1 F (36.7 C)   TempSrc:   Oral   Resp: 13 0 12 14  Height:      Weight:      SpO2: 91% 91% 95% 100%    Intake/Output Summary (Last 24 hours) at 03/18/15 0811 Last data filed at 03/18/15 0700  Gross per 24 hour  Intake 672.17 ml  Output   2725 ml  Net -2052.83 ml    LABS: Basic Metabolic Panel:  Recent Labs  50/75/73 0147 03/18/15 0519  NA 138 137  K 4.5 3.0*  CL 95* 92*  CO2 29 37*  GLUCOSE 146* 169*  BUN 69* 64*  CREATININE 2.49* 2.29*  CALCIUM 9.5 8.8*   Liver Function Tests:  Recent Labs  03/16/15 1619 03/17/15 0147  AST 154* 311*  ALT 92* 183*  ALKPHOS 146* 148*  BILITOT 3.2* 2.4*  PROT 6.6 5.6*  ALBUMIN 3.7 3.2*   No results for input(s): LIPASE, AMYLASE in the last 72 hours. CBC:  Recent Labs  03/16/15 1619 03/17/15 0147  WBC 10.3 12.3*  NEUTROABS 8.4* 9.9*  HGB 14.5 12.7  HCT 45.9 39.3  MCV 96.2 95.2  PLT 315 275   Cardiac Enzymes:  Recent Labs  03/17/15 0147 03/17/15 0649 03/17/15 1230  TROPONINI 0.05* 0.05* 0.06*   BNP: Invalid input(s): POCBNP D-Dimer: No results for input(s): DDIMER in the last 72 hours. Hemoglobin A1C: No results for input(s): HGBA1C in the last 72 hours. Fasting Lipid Panel: No results for input(s): CHOL, HDL, LDLCALC, TRIG, CHOLHDL, LDLDIRECT in the last 72 hours. Thyroid Function Tests: No results for input(s): TSH, T4TOTAL, T3FREE, THYROIDAB in the last 72 hours.  Invalid input(s): FREET3 Anemia Panel: No results for  input(s): VITAMINB12, FOLATE, FERRITIN, TIBC, IRON, RETICCTPCT in the last 72 hours.  RADIOLOGY: Dg Chest 2 View  03/16/2015   CLINICAL DATA:  Acute onset of chest pain and shortness of breath. Cough and congestion. Initial encounter.  EXAM: CHEST  2 VIEW  COMPARISON:  Chest radiograph performed 12/14/2014  FINDINGS: The lungs are well-aerated. A small left pleural effusion is noted. Chronically increased interstitial markings are noted. Mild peribronchial thickening is noted. No pneumothorax is identified. Scarring is seen at the left lung apex.  The heart is enlarged. The patient is status post median sternotomy. A valve replacement is noted. Orphaned pacemaker leads are visualized. No acute osseous abnormalities are seen.  IMPRESSION: Small left pleural effusion noted. Chronically increased interstitial markings seen. Cardiomegaly.   Electronically Signed   By: Roanna Raider M.D.   On: 03/16/2015 17:26   Ct Head Wo Contrast  03/17/2015   CLINICAL DATA:  Acute encephalopathy.  No headache.  EXAM: CT HEAD WITHOUT CONTRAST  TECHNIQUE: Contiguous axial images were obtained from the base of the skull through the vertex without intravenous contrast.  COMPARISON:  12/15/2014  FINDINGS: The ventricles are normal configuration. There is ventricular and sulcal enlargement reflecting mild to moderate age related  atrophy. No hydrocephalus.  There are no parenchymal masses or mass effect. There is no evidence of a cortical infarct. Mild periventricular white matter hypoattenuation is noted consistent with chronic microvascular ischemic change.  There are no extra-axial masses or abnormal fluid collections.  No intracranial hemorrhage.  Sinuses and mastoid air cells are clear.  IMPRESSION: 1. No acute intracranial abnormalities. 2. Mild to moderate atrophy. Mild chronic microvascular ischemic change.   Electronically Signed   By: Amie Portland M.D.   On: 03/17/2015 16:35   US Abdomen Complete  03/17/2015   CLINICAL  DATA:  Nausea and vomiting  EXAM: ULTRASOUND ABDOMEN COMPLETE  COMPARISON:  None.  FINDINGS: Gallbladder: Gallbladder wall thickening measuring 6 mm and pericholecystic fluid noted. Adherent sludge balls or polyps, largest 3 mm. No sonographic Murphy sign.  Common bile duct: Diameter: 7 mm  Liver: No focal lesion identified. Within normal limits in parenchymal echogenicity.  IVC: No abnormality visualized.  Pancreas: Visualized portion unremarkable.  Spleen: Size and appearance within normal limits.  Right Kidney: Length: 11.2 cm, increased renal cortical echogenicity. No hydronephrosis. Cortical cysts are noted, largest 1.5 x 1.4 x 1.4 cm.  Left Kidney: Length: 10.7 cm. Suboptimally visualized due to lack of optimal acoustic window with increased cortical echogenicity but no focal abnormality or mass. No hydronephrosis.  Abdominal aorta: No aneurysm visualized.  Other findings: Ascites and bilateral pleural effusions are noted. Incidental note is made of bladder distention, volume 1094 cc.  IMPRESSION: Increased renal cortical echogenicity suggesting medical renal disease, with right renal cysts.  Bladder distention.  Gallbladder wall thickening, pericholecystic fluid, and adherent sludge balls or polyps. If the patient has right upper quadrant abdominal pain, findings would be highly suspicious for acute cholecystitis. However, there is ascites and gallbladder wall thickening may also be secondary to liver disease or hypoproteinemia.  Pleural effusions and ascites.   Electronically Signed   By: Christiana Pellant M.D.   On: 03/17/2015 09:30   Dg Chest Port 1 View  03/17/2015   CLINICAL DATA:  Central line placement.  EXAM: PORTABLE CHEST - 1 VIEW  COMPARISON:  03/16/2015  FINDINGS: Patient is moderately rotated to the left. Sternotomy wires and prosthetic heart valve unchanged. Evidence of a right IJ central venous catheter with tip likely over the SVC accounting for patient's rotation. No evidence of  pneumothorax. There is continued left base opacification unchanged to slightly worse likely effusions/ atelectasis and less likely infection. Mild prominence of the perihilar markings suggesting mild vascular congestion. Remainder of the exam is unchanged.  IMPRESSION: Slight worsening left base opacification likely effusion with atelectasis although cannot exclude infection.  Suggestion mild vascular congestion.  Right IJ central venous catheter with tip likely over the region of the SVC. No pneumothorax.   Electronically Signed   By: Elberta Fortis M.D.   On: 03/17/2015 12:51    PHYSICAL EXAM General: NAD Neck: JVP 14-16 cm, no thyromegaly or thyroid nodule.  Lungs: Mild crackles at bases. CV: Nondisplaced PMI.  Heart regular S1/S2, +S3, 2/6 SEM RUSB.  1+ edema 1/2 to knee on left, cast on right lower leg.   Abdomen: Soft, nontender, no hepatosplenomegaly, no distention.  Neurologic: Alert and oriented x 3.  Psych: Normal affect. Extremities: No clubbing or cyanosis.   TELEMETRY: Reviewed telemetry pt in NSR with frequent PACs  ASSESSMENT AND PLAN: 81 yo with history of bioprosthetic MVR/AVR, nonischemic cardiomyopathy (EF 20-25% with LBBB), and CKD was admitted with acute on chronic systolic CHF with low output/volume  overloaded state.  AKI, lactate elevated.  Initially hyperkalemic with altered mental status.  1. Acute on chronic systolic CHF: Nonischemic cardiomyopathy with chronic LBBB.  EF 20-25%.  She has struggled recently with volume overload.  I recommended admission to her at last appointment, but she wanted to continue to try to manage at home.  She remains significantly volume overloaded, CVP 16. Good co-ox 66% on milrinone.  Creatinine trending down.  - Replete K, BMET again in pm.  - Continue milrinone gtt 0.25 - Lasix 40 mg IV x 1 then 10 mg/hr infusion - Will need to discuss CRT upgrade with EP once she stabilizes.  2. AKI on CKD: Suspect cardiorenal syndrome, some improvement  so far with milrinone and diuresis.  3. ID: Treated for UTI with ceftriaxone.  4. Atrial fibrillation: Has history of post-op afib. She has been in NSR so far but with frequent PACs.  Will need to watch for atrial fibrillation, higher risk with milrinone use.  5. Bioprosthetic MV/AoV: Stable on last echo.   [name] 03/18/2015 8:20 AM

## 2015-03-18 NOTE — Progress Notes (Signed)
[facility]  Discussed with cardiology, they plan to take over care, will re-assign attending  We are happy to help if needed  Heber [place], MD [facility] Pager: [phone] Cell: [phone] After 3pm or if no response, call [phone]

## 2015-03-18 NOTE — Progress Notes (Signed)
eLink Physician-Brief Progress Note Patient Name: Joanna [name] DOB: 1929-12-15 MRN: [id number]   Date of Service  03/18/2015  HPI/Events of Note    eICU Interventions  Hypokalemia -repleted      Intervention Category Intermediate Interventions: Electrolyte abnormality - evaluation and management  Thor Nannini V. 03/18/2015, 6:08 AM

## 2015-03-19 LAB — BASIC METABOLIC PANEL
Anion gap: 9 (ref 5–15)
Anion gap: 12 (ref 5–15)
BUN: 54 mg/dL — ABNORMAL HIGH (ref 6–20)
BUN: 50 mg/dL — AB (ref 6–20)
CALCIUM: 8.9 mg/dL (ref 8.9–10.3)
CO2: 37 mmol/L — AB (ref 22–32)
CO2: 31 mmol/L (ref 22–32)
CREATININE: 2.05 mg/dL — AB (ref 0.44–1.00)
Calcium: 8.8 mg/dL — ABNORMAL LOW (ref 8.9–10.3)
Chloride: 87 mmol/L — ABNORMAL LOW (ref 101–111)
Chloride: 89 mmol/L — ABNORMAL LOW (ref 101–111)
Creatinine, Ser: 1.72 mg/dL — ABNORMAL HIGH (ref 0.44–1.00)
GFR calc Af Amer: 25 mL/min — ABNORMAL LOW (ref >60)
GFR calc Af Amer: 31 mL/min — ABNORMAL LOW (ref >60)
GFR calc non Af Amer: 26 mL/min — ABNORMAL LOW (ref >60)
GFR, EST NON AFRICAN AMERICAN: 21 mL/min — AB (ref >60)
GLUCOSE: 152 mg/dL — AB (ref 65–99)
GLUCOSE: 206 mg/dL — AB (ref 65–99)
POTASSIUM: 2.8 mmol/L — AB (ref 3.5–5.1)
POTASSIUM: 3.6 mmol/L (ref 3.5–5.1)
SODIUM: 133 mmol/L — AB (ref 135–145)
Sodium: 132 mmol/L — ABNORMAL LOW (ref 135–145)

## 2015-03-19 LAB — GLUCOSE, CAPILLARY
Glucose-Capillary: 140 mg/dL — ABNORMAL HIGH (ref 65–99)
Glucose-Capillary: 163 mg/dL — ABNORMAL HIGH (ref 65–99)
Glucose-Capillary: 188 mg/dL — ABNORMAL HIGH (ref 65–99)

## 2015-03-19 LAB — CBC
HCT: 33.6 % — ABNORMAL LOW (ref 36.0–46.0)
HEMOGLOBIN: 10.5 g/dL — AB (ref 12.0–15.0)
MCH: 29.7 pg (ref 26.0–34.0)
MCHC: 31.3 g/dL (ref 30.0–36.0)
MCV: 95.2 fL (ref 78.0–100.0)
Platelets: 245 10*3/uL (ref 150–400)
RBC: 3.53 MIL/uL — AB (ref 3.87–5.11)
RDW: 16.1 % — ABNORMAL HIGH (ref 11.5–15.5)
WBC: 7.5 10*3/uL (ref 4.0–10.5)

## 2015-03-19 LAB — URINE CULTURE: Colony Count: >=100000

## 2015-03-19 LAB — CARBOXYHEMOGLOBIN
CARBOXYHEMOGLOBIN: 1.4 % (ref 0.5–1.5)
METHEMOGLOBIN: 0.8 % (ref 0.0–1.5)
O2 SAT: 66.5 %
TOTAL HEMOGLOBIN: 10.7 g/dL — AB (ref 12.0–16.0)

## 2015-03-19 LAB — MAGNESIUM: Magnesium: 1.9 mg/dL (ref 1.7–2.4)

## 2015-03-19 MED ORDER — POTASSIUM CHLORIDE CRYS ER 20 MEQ PO TBCR
40.0000 meq | EXTENDED_RELEASE_TABLET | ORAL | Status: AC
  Administered 2015-03-19: 40 meq via ORAL
  Filled 2015-03-19 (×3): qty 2

## 2015-03-19 MED ORDER — POTASSIUM CHLORIDE CRYS ER 20 MEQ PO TBCR
40.0000 meq | EXTENDED_RELEASE_TABLET | Freq: Two times a day (BID) | ORAL | Status: DC
  Administered 2015-03-19 – 2015-03-23 (×9): 40 meq via ORAL
  Filled 2015-03-19 (×13): qty 2

## 2015-03-19 MED ORDER — POTASSIUM CHLORIDE CRYS ER 20 MEQ PO TBCR
40.0000 meq | EXTENDED_RELEASE_TABLET | Freq: Once | ORAL | Status: AC
  Administered 2015-03-19: 40 meq via ORAL

## 2015-03-19 MED ORDER — MAGNESIUM SULFATE 2 GM/50ML IV SOLN
2.0000 g | Freq: Once | INTRAVENOUS | Status: AC
  Administered 2015-03-19: 2 g via INTRAVENOUS
  Filled 2015-03-19: qty 50

## 2015-03-19 MED ORDER — POTASSIUM CHLORIDE ER 10 MEQ PO TBCR
20.0000 meq | EXTENDED_RELEASE_TABLET | Freq: Once | ORAL | Status: DC
  Filled 2015-03-19: qty 2

## 2015-03-19 MED ORDER — POTASSIUM CHLORIDE CRYS ER 20 MEQ PO TBCR
40.0000 meq | EXTENDED_RELEASE_TABLET | Freq: Two times a day (BID) | ORAL | Status: DC
  Administered 2015-03-19: 40 meq via ORAL

## 2015-03-19 MED ORDER — POTASSIUM CHLORIDE CRYS ER 20 MEQ PO TBCR: 40.0000 meq | EXTENDED_RELEASE_TABLET | Freq: Two times a day (BID) | ORAL | Status: DC

## 2015-03-19 NOTE — Progress Notes (Signed)
eLink Physician-Brief Progress Note Patient Name: Joanna [name] DOB: 04-Feb-1930 MRN: [id number]   Date of Service  03/19/2015  HPI/Events of Note  BMET    Component Value Date/Time   NA 133* 03/19/2015 0340   K 2.8* 03/19/2015 0340   CL 87* 03/19/2015 0340   CO2 37* 03/19/2015 0340   GLUCOSE 152* 03/19/2015 0340   BUN 54* 03/19/2015 0340   CREATININE 2.05* 03/19/2015 0340   CALCIUM 8.8* 03/19/2015 0340   GFRNONAA 21* 03/19/2015 0340   GFRAA 25* 03/19/2015 0340      eICU Interventions  Will give KCL 40 meq po q4h x 2 doses.      Intervention Category Major Interventions: Other:  Meia Emley 03/19/2015, 5:37 AM

## 2015-03-19 NOTE — Progress Notes (Signed)
Pt. Pulled out central line accidentally. Lasix and Milrinone drips connected to peripheral IVs. Paged Dr. Shirlee Latch x 2. No response. Spoke to [facility] PA. Orders placed in [facility] by him.

## 2015-03-19 NOTE — Progress Notes (Signed)
Patient ID: Joanna [name], female   DOB: 1930-06-06, 81 y.o.   MRN: [id number]   SUBJECTIVE: Alert this morning, eating breakfast.    Admitted with volume overload, AKI, low output physiology with elevated lactate.  She was begun on milrinone gtt.    Lasix gtt yesterday with good UOP, will try to get standing up weight today.  CVP 12 this morning with co-ox 66.5%.   Scheduled Meds: . antiseptic oral rinse  7 mL Mouth Rinse BID  . aspirin EC  81 mg Oral Daily  . calcium-vitamin D  1 tablet Oral Q breakfast  . cefTRIAXone (ROCEPHIN)  IV  1 g Intravenous Daily  . docusate sodium  100 mg Oral BID  . enoxaparin (LOVENOX) injection  30 mg Subcutaneous QHS  . ferrous sulfate  325 mg Oral BID WC  . insulin aspart  0-9 Units Subcutaneous TID WC  . insulin glargine  10 Units Subcutaneous QHS  . linagliptin  5 mg Oral Daily  . magnesium sulfate 1 - 4 g bolus IVPB  2 g Intravenous Once  . potassium chloride  20 mEq Oral Once  . potassium chloride  40 mEq Oral Q4H  . [START ON 03/20/2015] potassium chloride  40 mEq Oral BID  . sodium chloride  10-40 mL Intracatheter Q12H   Continuous Infusions: . sodium chloride 10 mL/hr at 03/19/15 0400  . furosemide (LASIX) infusion 10 mg/hr (03/18/15 2000)  . milrinone 0.25 mcg/kg/min (03/18/15 2000)   PRN Meds:.[DISCONTINUED] ondansetron **OR** ondansetron (ZOFRAN) IV, ondansetron, oxyCODONE, sodium chloride    Filed Vitals:   03/19/15 0400 03/19/15 0500 03/19/15 0600 03/19/15 0700  BP: 99/65 94/69 102/61 77/51  Pulse: 49 50 81 89  Temp:      TempSrc:      Resp: Height:      Weight:      SpO2: 95% 96% 98% 98%    Intake/Output Summary (Last 24 hours) at 03/19/15 0753 Last data filed at 03/19/15 0600  Gross per 24 hour  Intake 1322.83 ml  Output   2775 ml  Net -1452.17 ml    LABS: Basic Metabolic Panel:  Recent Labs  08/67/61 1520 03/19/15 0340  NA 134* 133*  K 3.6 2.8*  CL 87* 87*  CO2 36* 37*  GLUCOSE 227* 152*    BUN 60* 54*  CREATININE 2.18* 2.05*  CALCIUM 8.9 8.8*  MG  --  1.9   Liver Function Tests:  Recent Labs  03/16/15 1619 03/17/15 0147  AST 154* 311*  ALT 92* 183*  ALKPHOS 146* 148*  BILITOT 3.2* 2.4*  PROT 6.6 5.6*  ALBUMIN 3.7 3.2*   No results for input(s): LIPASE, AMYLASE in the last 72 hours. CBC:  Recent Labs  03/16/15 1619 03/17/15 0147 03/19/15 0340  WBC 10.3 12.3* 7.5  NEUTROABS 8.4* 9.9*  --   HGB 14.5 12.7 10.5*  HCT 45.9 39.3 33.6*  MCV 96.2 95.2 95.2  PLT 315 275 245   Cardiac Enzymes:  Recent Labs  03/17/15 0147 03/17/15 0649 03/17/15 1230  TROPONINI 0.05* 0.05* 0.06*   BNP: Invalid input(s): POCBNP D-Dimer: No results for input(s): DDIMER in the last 72 hours. Hemoglobin A1C: No results for input(s): HGBA1C in the last 72 hours. Fasting Lipid Panel: No results for input(s): CHOL, HDL, LDLCALC, TRIG, CHOLHDL, LDLDIRECT in the last 72 hours. Thyroid Function Tests: No results for input(s): TSH, T4TOTAL, T3FREE, THYROIDAB in the last 72 hours.  Invalid input(s): FREET3 Anemia Panel:  No results for input(s): VITAMINB12, FOLATE, FERRITIN, TIBC, IRON, RETICCTPCT in the last 72 hours.  RADIOLOGY: Dg Chest 2 View  03/16/2015   CLINICAL DATA:  Acute onset of chest pain and shortness of breath. Cough and congestion. Initial encounter.  EXAM: CHEST  2 VIEW  COMPARISON:  Chest radiograph performed 12/14/2014  FINDINGS: The lungs are well-aerated. A small left pleural effusion is noted. Chronically increased interstitial markings are noted. Mild peribronchial thickening is noted. No pneumothorax is identified. Scarring is seen at the left lung apex.  The heart is enlarged. The patient is status post median sternotomy. A valve replacement is noted. Orphaned pacemaker leads are visualized. No acute osseous abnormalities are seen.  IMPRESSION: Small left pleural effusion noted. Chronically increased interstitial markings seen. Cardiomegaly.   Electronically  Signed   By: Roanna Raider M.D.   On: 03/16/2015 17:26   Ct Head Wo Contrast  03/17/2015   CLINICAL DATA:  Acute encephalopathy.  No headache.  EXAM: CT HEAD WITHOUT CONTRAST  TECHNIQUE: Contiguous axial images were obtained from the base of the skull through the vertex without intravenous contrast.  COMPARISON:  12/15/2014  FINDINGS: The ventricles are normal configuration. There is ventricular and sulcal enlargement reflecting mild to moderate age related atrophy. No hydrocephalus.  There are no parenchymal masses or mass effect. There is no evidence of a cortical infarct. Mild periventricular white matter hypoattenuation is noted consistent with chronic microvascular ischemic change.  There are no extra-axial masses or abnormal fluid collections.  No intracranial hemorrhage.  Sinuses and mastoid air cells are clear.  IMPRESSION: 1. No acute intracranial abnormalities. 2. Mild to moderate atrophy. Mild chronic microvascular ischemic change.   Electronically Signed   By: Amie Portland M.D.   On: 03/17/2015 16:35   US Abdomen Complete  03/17/2015   CLINICAL DATA:  Nausea and vomiting  EXAM: ULTRASOUND ABDOMEN COMPLETE  COMPARISON:  None.  FINDINGS: Gallbladder: Gallbladder wall thickening measuring 6 mm and pericholecystic fluid noted. Adherent sludge balls or polyps, largest 3 mm. No sonographic Murphy sign.  Common bile duct: Diameter: 7 mm  Liver: No focal lesion identified. Within normal limits in parenchymal echogenicity.  IVC: No abnormality visualized.  Pancreas: Visualized portion unremarkable.  Spleen: Size and appearance within normal limits.  Right Kidney: Length: 11.2 cm, increased renal cortical echogenicity. No hydronephrosis. Cortical cysts are noted, largest 1.5 x 1.4 x 1.4 cm.  Left Kidney: Length: 10.7 cm. Suboptimally visualized due to lack of optimal acoustic window with increased cortical echogenicity but no focal abnormality or mass. No hydronephrosis.  Abdominal aorta: No aneurysm  visualized.  Other findings: Ascites and bilateral pleural effusions are noted. Incidental note is made of bladder distention, volume 1094 cc.  IMPRESSION: Increased renal cortical echogenicity suggesting medical renal disease, with right renal cysts.  Bladder distention.  Gallbladder wall thickening, pericholecystic fluid, and adherent sludge balls or polyps. If the patient has right upper quadrant abdominal pain, findings would be highly suspicious for acute cholecystitis. However, there is ascites and gallbladder wall thickening may also be secondary to liver disease or hypoproteinemia.  Pleural effusions and ascites.   Electronically Signed   By: Christiana Pellant M.D.   On: 03/17/2015 09:30   Dg Chest Port 1 View  03/17/2015   CLINICAL DATA:  Central line placement.  EXAM: PORTABLE CHEST - 1 VIEW  COMPARISON:  03/16/2015  FINDINGS: Patient is moderately rotated to the left. Sternotomy wires and prosthetic heart valve unchanged. Evidence of a right IJ central  venous catheter with tip likely over the SVC accounting for patient's rotation. No evidence of pneumothorax. There is continued left base opacification unchanged to slightly worse likely effusions/ atelectasis and less likely infection. Mild prominence of the perihilar markings suggesting mild vascular congestion. Remainder of the exam is unchanged.  IMPRESSION: Slight worsening left base opacification likely effusion with atelectasis although cannot exclude infection.  Suggestion mild vascular congestion.  Right IJ central venous catheter with tip likely over the region of the SVC. No pneumothorax.   Electronically Signed   By: Elberta Fortis M.D.   On: 03/17/2015 12:51    PHYSICAL EXAM General: NAD Neck: JVP 14-16 cm, no thyromegaly or thyroid nodule.  Lungs: Mild crackles at bases. CV: Nondisplaced PMI.  Heart regular S1/S2, +S3, 2/6 SEM RUSB.  1+ edema 1/2 to knee on left, cast on right lower leg.   Abdomen: Soft, nontender, no  hepatosplenomegaly, no distention.  Neurologic: Alert and oriented x 3.  Psych: Normal affect. Extremities: No clubbing or cyanosis. Erythema lower leg on right.   TELEMETRY: Reviewed telemetry pt in NSR with frequent PACs  ASSESSMENT AND PLAN: 81 yo with history of bioprosthetic MVR/AVR, nonischemic cardiomyopathy (EF 20-25% with LBBB), and CKD was admitted with acute on chronic systolic CHF with low output/volume overloaded state.  AKI, lactate elevated.  Initially hyperkalemic with altered mental status.  1. Acute on chronic systolic CHF: Nonischemic cardiomyopathy with chronic LBBB.  EF 20-25%.  She has struggled recently with volume overload.  I recommended admission to her at last appointment, but she wanted to continue to try to manage at home.  She remains significantly volume overloaded, CVP 12 now (lower). Good co-ox 66% on milrinone.  Creatinine trending down.  - Replete K and Mg aggressively, BMET again in pm.  - Continue milrinone gtt 0.25 - Lasix gtt 12 mg/hr - Will have EP evaluate for CRT given wide LBBB and difficulty tolerating po CHF meds. 2. AKI on CKD: Suspect cardiorenal syndrome, some improvement so far with milrinone and diuresis.  3. ID: Treated for UTI with ceftriaxone.  4. Atrial fibrillation: Has history of post-op afib. She has been in NSR so far but with frequent PACs.  Will need to watch for atrial fibrillation, higher risk with milrinone use.  5. Bioprosthetic MV/AoV: Stable on last echo.   [name] 03/19/2015 7:53 AM

## 2015-03-20 ENCOUNTER — Encounter (HOSPITAL_COMMUNITY): Payer: Self-pay | Admitting: Nurse Practitioner

## 2015-03-20 ENCOUNTER — Inpatient Hospital Stay (HOSPITAL_COMMUNITY): Payer: Medicare Other

## 2015-03-20 DIAGNOSIS — I447 Left bundle-branch block, unspecified: Secondary | ICD-10-CM

## 2015-03-20 LAB — BASIC METABOLIC PANEL
Anion gap: 12 (ref 5–15)
Anion gap: 13 (ref 5–15)
BUN: 51 mg/dL — ABNORMAL HIGH (ref 6–20)
BUN: 47 mg/dL — AB (ref 6–20)
CHLORIDE: 83 mmol/L — AB (ref 101–111)
CO2: 34 mmol/L — ABNORMAL HIGH (ref 22–32)
CO2: 35 mmol/L — AB (ref 22–32)
CREATININE: 1.8 mg/dL — AB (ref 0.44–1.00)
Calcium: 8.8 mg/dL — ABNORMAL LOW (ref 8.9–10.3)
Calcium: 9.1 mg/dL (ref 8.9–10.3)
Chloride: 87 mmol/L — ABNORMAL LOW (ref 101–111)
Creatinine, Ser: 1.59 mg/dL — ABNORMAL HIGH (ref 0.44–1.00)
GFR calc Af Amer: 34 mL/min — ABNORMAL LOW (ref >60)
GFR calc Af Amer: 29 mL/min — ABNORMAL LOW (ref >60)
GFR calc non Af Amer: 29 mL/min — ABNORMAL LOW (ref >60)
GFR calc non Af Amer: 25 mL/min — ABNORMAL LOW (ref >60)
GLUCOSE: 156 mg/dL — AB (ref 65–99)
Glucose, Bld: 137 mg/dL — ABNORMAL HIGH (ref 65–99)
POTASSIUM: 3.6 mmol/L (ref 3.5–5.1)
Potassium: 3 mmol/L — ABNORMAL LOW (ref 3.5–5.1)
Sodium: 133 mmol/L — ABNORMAL LOW (ref 135–145)
Sodium: 131 mmol/L — ABNORMAL LOW (ref 135–145)

## 2015-03-20 LAB — GLUCOSE, CAPILLARY
GLUCOSE-CAPILLARY: 178 mg/dL — AB (ref 65–99)
Glucose-Capillary: 250 mg/dL — ABNORMAL HIGH (ref 65–99)
Glucose-Capillary: 129 mg/dL — ABNORMAL HIGH (ref 65–99)
Glucose-Capillary: 179 mg/dL — ABNORMAL HIGH (ref 65–99)
Glucose-Capillary: 142 mg/dL — ABNORMAL HIGH (ref 65–99)

## 2015-03-20 MED ORDER — AMIODARONE HCL IN DEXTROSE 360-4.14 MG/200ML-% IV SOLN
60.0000 mg/h | INTRAVENOUS | Status: AC
  Administered 2015-03-20 (×2): 60 mg/h via INTRAVENOUS

## 2015-03-20 MED ORDER — MAGNESIUM SULFATE IN D5W 10-5 MG/ML-% IV SOLN
1.0000 g | Freq: Once | INTRAVENOUS | Status: AC
Start: 2015-03-20 — End: 2015-03-20
  Administered 2015-03-20: 1 g via INTRAVENOUS
  Filled 2015-03-20: qty 100

## 2015-03-20 MED ORDER — SODIUM CHLORIDE 0.9 % IJ SOLN
10.0000 mL | Freq: Two times a day (BID) | INTRAMUSCULAR | Status: DC
  Administered 2015-03-20 – 2015-03-27 (×9): 10 mL
  Administered 2015-03-28: 20 mL
  Administered 2015-03-29 – 2015-03-30 (×3): 10 mL

## 2015-03-20 MED ORDER — SODIUM CHLORIDE 0.9 % IR SOLN
80.0000 mg | Status: DC
  Administered 2015-03-21: 80 mg
  Filled 2015-03-20 (×2): qty 2

## 2015-03-20 MED ORDER — POTASSIUM CHLORIDE CRYS ER 20 MEQ PO TBCR
40.0000 meq | EXTENDED_RELEASE_TABLET | Freq: Once | ORAL | Status: AC
  Administered 2015-03-20: 40 meq via ORAL
  Filled 2015-03-20: qty 2

## 2015-03-20 MED ORDER — CEFAZOLIN SODIUM-DEXTROSE 2-3 GM-% IV SOLR
2.0000 g | INTRAVENOUS | Status: DC
  Administered 2015-03-21: 2 g via INTRAVENOUS
  Filled 2015-03-20: qty 50

## 2015-03-20 MED ORDER — SODIUM CHLORIDE 0.9 % IJ SOLN
10.0000 mL | INTRAMUSCULAR | Status: DC | PRN
  Administered 2015-03-24: 10 mL
  Filled 2015-03-20: qty 40

## 2015-03-20 MED ORDER — CHLORHEXIDINE GLUCONATE 4 % EX LIQD
60.0000 mL | Freq: Once | CUTANEOUS | Status: AC
  Administered 2015-03-21: 4 via TOPICAL
  Filled 2015-03-20: qty 60

## 2015-03-20 MED ORDER — SODIUM CHLORIDE 0.9 % IV SOLN: INTRAVENOUS | Status: DC

## 2015-03-20 MED ORDER — SODIUM CHLORIDE 0.9 % IV SOLN
INTRAVENOUS | Status: DC
  Administered 2015-03-21: 06:00:00 via INTRAVENOUS

## 2015-03-20 MED ORDER — AMIODARONE HCL IN DEXTROSE 360-4.14 MG/200ML-% IV SOLN
30.0000 mg/h | INTRAVENOUS | Status: DC
  Administered 2015-03-20 – 2015-03-22 (×3): 30 mg/h via INTRAVENOUS
  Filled 2015-03-20 (×8): qty 200
  Filled 2015-03-20: qty 400

## 2015-03-20 MED ORDER — AMIODARONE LOAD VIA INFUSION
150.0000 mg | Freq: Once | INTRAVENOUS | Status: AC
  Administered 2015-03-20: 150 mg via INTRAVENOUS
  Filled 2015-03-20: qty 83.34

## 2015-03-20 MED ORDER — SPIRONOLACTONE 12.5 MG HALF TABLET: 12.5000 mg | ORAL_TABLET | Freq: Every day | ORAL | Status: DC

## 2015-03-20 MED ORDER — CHLORHEXIDINE GLUCONATE 4 % EX LIQD
60.0000 mL | Freq: Once | CUTANEOUS | Status: AC
  Administered 2015-03-20: 4 via TOPICAL
  Filled 2015-03-20: qty 60

## 2015-03-20 NOTE — Progress Notes (Signed)
CARDIAC REHAB PHASE I   Went to ambulate pt. Pt daughter at bedside. Pt daughter states pt came directly to hospital from [facility] and is totally non-weight bearing to the RLE. Pt not appropriate for cardiac rehab at this time, RN notified. Pt need PT consult prior to working with cardiac rehab. Will follow progress.   Joylene Grapes, RN, BSN 03/20/2015 11:14 AM

## 2015-03-20 NOTE — Progress Notes (Signed)
0900Peripherally Inserted Central Catheter/Midline Placement  The IV Nurse has discussed with the patient and/or persons authorized to consent for the patient, the purpose of this procedure and the potential benefits and risks involved with this procedure.  The benefits include less needle sticks, lab draws from the catheter and patient may be discharged home with the catheter.  Risks include, but not limited to, infection, bleeding, blood clot (thrombus formation), and puncture of an artery; nerve damage and irregular heat beat.  Alternatives to this procedure were also discussed.  PICC/Midline Placement Documentation  PICC Triple Lumen 03/20/15 PICC Right Basilic 37 cm 1 cm (Active)  Indication for Insertion or Continuance of Line Vasoactive infusions 03/20/2015  9:00 AM  Exposed Catheter (cm) 1 cm 03/20/2015  9:00 AM  Dressing Change Due 03/27/15 03/20/2015  9:00 AM       Stacie Glaze Horton 03/20/2015, 9:54 AM

## 2015-03-20 NOTE — Progress Notes (Signed)
Inpatient Diabetes Program Recommendations  AACE/ADA: New Consensus Statement on Inpatient Glycemic Control (2013)  Target Ranges:  Prepandial:   less than 140 mg/dL      Peak postprandial:   less than 180 mg/dL (1-2 hours)      Critically ill patients:  140 - 180 mg/dL   Results for DAVI, SCHUTTER (MRN [id number]) as of 03/20/2015 09:17  Ref. Range 03/19/2015 08:50 03/19/2015 12:04 03/19/2015 16:44 03/19/2015 21:07 03/20/2015 07:36  Glucose-Capillary Latest Ref Range: 65-99 mg/dL 629 (H) 528 (H) 413 (H) 250 (H) 129 (H)   Diabetes history: DM2 Outpatient Diabetes medications: Toujeo 20 units daily, Novolog 3 TID with meals, Januvia 50 mg daily Current orders for Inpatient glycemic control: Lantus 10 units QHS, Novolog 0-9 units TID with meals, Tradjenta 5 mg daily  Inpatient Diabetes Program Recommendations Correction (SSI): Please consider ordering Novolog bedtime correction scale. Insulin - Meal Coverage: If patient is eating at least 50% of meals, please consider ordering Novolog 2 units TID with meals for meal coverage (in addition to Novolog correction scale).  Thanks, Orlando Penner, RN, MSN, CCRN, CDE Diabetes Coordinator Inpatient Diabetes Program [phone] (Team Pager from 8am to 5pm) [phone] ([facility] office) [phone] [facility] office) [phone] [facility] office)

## 2015-03-20 NOTE — Progress Notes (Signed)
Spoke w MrS Tri re CRT P procedure tomorrow The benefits and risks were reviewed including but not limited to death,  perforation, infection, lead dislodgement and device malfunction.  The patient understands agrees and is willing to proceed.  We will pirate the LV epicardial leads, and look at Q-LV to determine appropriateness of use  She has foley in place so can continue IV lasix and will continue IV amio for now  She is on ceftiaxone for UTI but she also seems to have cellulitis in her leg  Will continue coverage postdevice but no evidence of systemic infection

## 2015-03-20 NOTE — Consult Note (Addendum)
ELECTROPHYSIOLOGY CONSULT NOTE    Patient ID: Joanna [name] MRN: [id number], DOB/AGE: January 16, 1930 81 y.o.  Admit date: 03/16/2015 Date of Consult: 03/20/2015  Primary Physician: Sanda Linger, MD Primary Cardiologist: Shirlee Latch  Reason for Consultation: chronic systolic heart failure, LBBB  HPI:  Joanna [name] is a 81 y.o. female with a past medical history significant for aortic stenosis (s/p AVR/MVR), chronic systolic heart failure, NICM, hypertension, diabetes, hyperlipidemia, and PAD. She is followed closely by the AHF clinic.  She was admitted with volume overload, acute renal insufficiency, and low output state. She was started on milrinone drip with significant improvement in symptoms. EP has been asked to evaluate for treatment options.  She currently states that her shortness of breath is much improved.  She denies chest pain, recent fevers, chills, nausea or vomiting.  She has not had palpitations, dizziness, pre-syncope, or syncope.   Echo 01/2015 demonstrated EF 20-25%, mild LVH, diffuse hypokinesis, bioprosthetic aortic valave and mitral valve, LA moderately to severely dilated (48), moderate TR.  Past Medical History  Diagnosis Date  . Peripheral artery disease   . Hyperlipidemia        . Arthritis   . Aortic stenosis      a. s/p AVR  . BUNDLE BRANCH BLOCK, LEFT    . CAROTID ARTERY STENOSIS 01/05/2009  . CVA 03/14/2010  . PERIPHERAL VASCULAR DISEASE 07/30/2007  . Chronic combined systolic and diastolic CHF (congestive heart failure)     a. RHC 09/2011 RA 8, RV 58/2/11; PA 54/22 (37) PCWP 20; F CO/CI 4.57/2.67 PVR 3.7; b. L main no sig dz, LAD luminal irreg; LCx lg Ramus with luminal irreg, small AV Lcx witout sig dz, RCA luminal irreg; severe mitral annular calcifications; AV calcified c. EF 45-50% (07/2013);  d. 11/2014 Echo: EF 20-25%, diff HK, nl AV/MV, sev dil LA, mod TR/PR, PASP .  . Diabetes mellitus   . HTN (hypertension)   . Iron deficiency anemia 09/21/2011    . History of shingles   . Mitral regurgitation     a. s/p MVR  . S/P aortic valve replacement with bioprosthetic valve 06/15/2013    21 mm [facility] Ease bovine pericardial tissue valve  . S/P mitral valve replacement with bioprosthetic valve 06/15/2013    25 mm [facility] Mitral bovine pericardial tissue valve  . Fracture, fibula, shaft 01/30/2015    right     Surgical History:  Past Surgical History  Procedure Laterality Date  . Carotid endarterectomy Left 2011  . Polypectomy  12/27/04  . Cataract extraction Bilateral 2009  . Tee without cardioversion N/A 05/19/2013    Procedure: TRANSESOPHAGEAL ECHOCARDIOGRAM (TEE);  Surgeon: Laurey Morale, MD;  Location: [facility] ENDOSCOPY;  Service: Cardiovascular;  Laterality: N/A;  . Colonoscopy    . Cardiac catheterization  2012  . Aortic valve replacement N/A 06/15/2013    Procedure: AORTIC VALVE REPLACEMENT (AVR);  Surgeon: Purcell Nails, MD;  Location: [facility] OR;  Service: Open Heart Surgery;  Laterality: N/A;  . Intraoperative transesophageal echocardiogram N/A 06/15/2013    Procedure: INTRAOPERATIVE TRANSESOPHAGEAL ECHOCARDIOGRAM;  Surgeon: Purcell Nails, MD;  Location: [facility] OR;  Service: Open Heart Surgery;  Laterality: N/A;  . Mitral valve replacement N/A 06/15/2013    Procedure: MITRAL VALVE (MV) REPLACEMENT;  Surgeon: Purcell Nails, MD;  Location: [facility] OR;  Service: Open Heart Surgery;  Laterality: N/A;  . Epicardial pacing lead placement N/A 06/15/2013    Procedure: EPICARDIAL PACING LEAD PLACEMENT;  Surgeon: Salvatore Decent  Cornelius Moras, MD;  Location: [facility] OR;  Service: Open Heart Surgery;  Laterality: N/A;  . Left and right heart catheterization with coronary angiogram N/A 09/19/2011    Procedure: LEFT AND RIGHT HEART CATHETERIZATION WITH CORONARY ANGIOGRAM;  Surgeon: Dolores Patty, MD;  Location[facility] CATH LAB;  Service: Cardiovascular;  Laterality: N/A;  . Orif ankle fracture Right 01/31/2015    Procedure: OPEN REDUCTION INTERNAL FIXATION (ORIF)  ANKLE FRACTURE;  Surgeon: Eldred Manges, MD;  Location: [facility] OR;  Service: Orthopedics;  Laterality: Right;     Prescriptions prior to admission  Medication Sig Dispense Refill Last Dose  . acetaminophen (TYLENOL) 325 MG tablet Take 325 mg by mouth every 6 (six) hours as needed for mild pain.   unknown at unknown  . aspirin EC 81 MG tablet Take 1 tablet (81 mg total) by mouth daily. 90 tablet 3 03/16/2015 at Unknown time  . calcium citrate-vitamin D (CITRACAL+D) 315-200 MG-UNIT per tablet Take 1 tablet by mouth daily.   03/16/2015 at Unknown time  . docusate sodium (COLACE) 100 MG capsule Take 100 mg by mouth 2 (two) times daily.     03/16/2015 at Unknown time  . ferrous sulfate 325 (65 FE) MG tablet Take 1 tablet (325 mg total) by mouth 2 (two) times daily with a meal. 180 tablet 1 03/16/2015 at Unknown time  . furosemide (LASIX) 80 MG tablet Take 80 mg by mouth 2 (two) times daily.   03/16/2015 at Unknown time  . HYDROcodone-acetaminophen (NORCO/VICODIN) 5-325 MG per tablet Take 1-2 tablets by mouth every 4 (four) hours as needed (breakthrough pain). 30 tablet 0 Past Week at Unknown time  . insulin aspart (NOVOLOG) 100 UNIT/ML FlexPen Inject 3 Units into the skin 3 (three) times daily with meals. 15 mL 0 03/15/2015 at Unknown time  . Insulin Glargine (TOUJEO SOLOSTAR) 300 UNIT/ML SOPN Inject 20 Units into the skin daily. 4.5 mL 0 03/15/2015 at Unknown time  . JANUVIA 50 MG tablet Take 50 mg by mouth daily.   03/16/2015 at Unknown time  . metolazone (ZAROXOLYN) 5 MG tablet Take 1 tablet (5 mg total) by mouth 3 (three) times a week. Monday Wednesday and Friday 15 tablet 3 Past Week at Unknown time  . metolazone (ZAROXOLYN) 5 MG tablet Take 5 mg by mouth See admin instructions. Take on Monday Wednesday and Friday (Give Prior to lasix)   03/16/2015 at Unknown time  . ondansetron (ZOFRAN) 4 MG tablet Take 4 mg by mouth every 4 (four) hours as needed for nausea or vomiting.   03/16/2015 at Unknown time  . potassium  chloride SA (K-DUR,KLOR-CON) 20 MEQ tablet Take 40 mEq by mouth See admin instructions. Take on Monday Wednesday and Fridays when metolazone is administered.   03/16/2015 at Unknown time  . potassium chloride SA (K-DUR,KLOR-CON) 20 MEQ tablet Take 60 mEq by mouth 2 (two) times daily.   03/16/2015 at Unknown time  . promethazine (PHENERGAN) 25 MG tablet Take 25 mg by mouth every 12 (twelve) hours as needed for nausea or vomiting.   Past Week at Unknown time  . simvastatin (ZOCOR) 20 MG tablet Take 1 tablet (20 mg total) by mouth every evening. 90 tablet 3 03/15/2015 at Unknown time  . furosemide (LASIX) 40 MG tablet Take 2 tablets (80 mg total) by mouth 2 (two) times daily. (Patient not taking: Reported on 03/16/2015) 45 tablet 3 Not Taking at Unknown time  . Insulin Pen Needle (NOVOFINE) 32G X 6 MM MISC Use daily with  insulin 300 each 3 Taking  . potassium chloride SA (K-DUR,KLOR-CON) 20 MEQ tablet Take 3 tablets (60 mEq total) by mouth 2 (two) times daily. Take 3 extra tabs (60 MEQ) on Mon Wed and Fri with Metolazone (Patient not taking: Reported on 03/16/2015) 120 tablet 3 Not Taking at Unknown time    Inpatient Medications:  . antiseptic oral rinse  7 mL Mouth Rinse BID  . aspirin EC  81 mg Oral Daily  . calcium-vitamin D  1 tablet Oral Q breakfast  . cefTRIAXone (ROCEPHIN)  IV  1 g Intravenous Daily  . docusate sodium  100 mg Oral BID  . enoxaparin (LOVENOX) injection  30 mg Subcutaneous QHS  . ferrous sulfate  325 mg Oral BID WC  . insulin aspart  0-9 Units Subcutaneous TID WC  . insulin glargine  10 Units Subcutaneous QHS  . linagliptin  5 mg Oral Daily  . potassium chloride  40 mEq Oral BID  . sodium chloride  10-40 mL Intracatheter Q12H  . sodium chloride  10-40 mL Intracatheter Q12H    Allergies:  Allergies  Allergen Reactions  . Other Hives    STEROIDS  . Prednisone Hives and Other (See Comments)    She is allergic to all steroids!    History   Social History  . Marital  Status: Divorced    Spouse Name: N/A  . Number of Children: N/A  . Years of Education: N/A   Occupational History  . Not on file.   Social History Main Topics  . Smoking status: Never Smoker   . Smokeless tobacco: Never Used  . Alcohol Use: No  . Drug Use: No  . Sexual Activity: No   Other Topics Concern  . Not on file   Social History Narrative   Patient lives alone here in town   She continues to work, helping to bind and oversew books   She is divorced after 23 years of marriage   1 son- '61, minister; 1 dtr '55; 4 grandchildren   End of life care-provided packet on living well     Family History  Problem Relation Age of Onset  . Colon cancer Mother   . Diabetes Neg Hx   . Coronary artery disease Neg Hx      Review of Systems: All other systems reviewed and are otherwise negative except as noted above.  Physical Exam: Filed Vitals:   03/20/15 0308 03/20/15 0327 03/20/15 0737 03/20/15 1100  BP:  110/64 96/63 86/59   Pulse:  103    Temp:  98.1 F (36.7 C) 98.2 F (36.8 C) 97.8 F (36.6 C)  TempSrc:  Oral Oral Oral  Resp:  13 16 20   Height:      Weight: 145 lb 11.6 oz (66.1 kg)  139 lb 8 oz (63.277 kg)   SpO2:  92% 93% 94%    GEN- The patient is elderly and chronically ill appearing, alert and oriented x 3 today.   HEENT: normocephalic, atraumatic; sclera clear, conjunctiva pink; hearing intact; oropharynx clear; neck supple, no JVP Lymph- no cervical lymphadenopathy Lungs- Clear to ausculation bilaterally, normal work of breathing.  No wheezes, rales, rhonchi Heart- Regular rate and rhythm, 2/6 SEM  GI- soft, non-tender, non-distended, bowel sounds present  Extremities- no clubbing, cyanosis, 1+ edema; DP/PT/radial pulses 2+ bilaterally, right sided PICC, cast RLE MS- no significant deformity or atrophy Skin- warm and dry, no rash or lesion Psych- euthymic mood, full affect Neuro- strength and sensation are intact  Labs:   Lab Results  Component  Value Date   WBC 7.5 03/19/2015   HGB 10.5* 03/19/2015   HCT 33.6* 03/19/2015   MCV 95.2 03/19/2015   PLT 245 03/19/2015    Recent Labs Lab 03/17/15 0147  03/20/15 0520  NA 138  < > 133*  K 4.5  < > 3.0*  CL 95*  < > 87*  CO2 29  < > 34*  BUN 69*  < > 51*  CREATININE 2.49*  < > 1.59*  CALCIUM 9.5  < > 8.8*  PROT 5.6*  --   --   BILITOT 2.4*  --   --   ALKPHOS 148*  --   --   ALT 183*  --   --   AST 311*  --   --   GLUCOSE 146*  < > 137*  < > = values in this interval not displayed.    Radiology/Studies: Dg Chest 2 View 03/16/2015   CLINICAL DATA:  Acute onset of chest pain and shortness of breath. Cough and congestion. Initial encounter.  EXAM: CHEST  2 VIEW  COMPARISON:  Chest radiograph performed 12/14/2014  FINDINGS: The lungs are well-aerated. A small left pleural effusion is noted. Chronically increased interstitial markings are noted. Mild peribronchial thickening is noted. No pneumothorax is identified. Scarring is seen at the left lung apex.  The heart is enlarged. The patient is status post median sternotomy. A valve replacement is noted. Orphaned pacemaker leads are visualized. No acute osseous abnormalities are seen.  IMPRESSION: Small left pleural effusion noted. Chronically increased interstitial markings seen. Cardiomegaly.   Electronically Signed   By: Roanna Raider M.D.   On: 03/16/2015 17:26   Ct Head Wo Contrast 03/17/2015   CLINICAL DATA:  Acute encephalopathy.  No headache.  EXAM: CT HEAD WITHOUT CONTRAST  TECHNIQUE: Contiguous axial images were obtained from the base of the skull through the vertex without intravenous contrast.  COMPARISON:  12/15/2014  FINDINGS: The ventricles are normal configuration. There is ventricular and sulcal enlargement reflecting mild to moderate age related atrophy. No hydrocephalus.  There are no parenchymal masses or mass effect. There is no evidence of a cortical infarct. Mild periventricular white matter hypoattenuation is noted  consistent with chronic microvascular ischemic change.  There are no extra-axial masses or abnormal fluid collections.  No intracranial hemorrhage.  Sinuses and mastoid air cells are clear.  IMPRESSION: 1. No acute intracranial abnormalities. 2. Mild to moderate atrophy. Mild chronic microvascular ischemic change.   Electronically Signed   By: Amie Portland M.D.   On: 03/17/2015 16:35   EKG: sinus rhythm, 1st degree AV block (PR ), LBBB (QRS 195)  TELEMETRY: sinus tach with intermittent short runs of AF  Assessment/Plan: 1.  NICM/chronic systolic heart failure The patient has a NICM and has been admitted with heart failure exacerbation.  She has a baseline LBBB with QRS of .  Medical therapy is limited by hypotension. She would be expected to receive benefit from CRT pacing.  At time of valve surgery in 2014, LV epicardial leads were placed.  Discussed rationale behind CRT pacing with the patient and her daughter today. They would like to proceed. She has been tentatively placed on the board for tomorrow.   2.  Paroxysmal atrial fibrillation She has had brief paroxysms of atrial fibrillation on telemetry in the setting of milrinone therapy.   With enlarged LA, likelihood of recurrent AF is higher.   CHADS2VASC is at least 9.  Will monitor AF burden with pacemaker.   3. Valvular heart disease Stable on last echo  4.  Acute renal insufficiency Improved since admission   Signed, Gypsy Balsam, NP 03/20/2015 12:19 PM   I have seen, examined the patient, and reviewed the above assessment and plan. On exam, RRR.  She does on telemetry appear to have frequent AF episodes.  Changes to above are made where necessary.   Given very wide QRS with LBBB >150 msec, I am optimistic that she may improve with CRT.  I have spoken with Dr Shirlee Latch and we agree that given her advanced age that an ICD should be avoided.  Certainly there is data to suggest similar mortality benefit in elderly  patients with CRT-P when compared to CRT-D.  She has epicardial LV leads previously placed.  Hopefully these will be suitable for use.  Risks benefits and alteratives to CRT-P implant were discussed with the patient who wishes to proceed. We will therefore place on schedule for tomorrow.  GIven her cormobidities, she is at risk for further decompensation/ worsening CHF.  A high level of decision making was required for this encounter today.  Co Sign: Hillis Range, MD 03/20/2015 3:33 PM

## 2015-03-20 NOTE — Progress Notes (Addendum)
Patient ID: Joanna [name], female   DOB: 30-Mar-1930, 81 y.o.   MRN: [id number]   SUBJECTIVE:   Admitted with volume overload, AKI, low output physiology with elevated lactate.  She was begun on milrinone gtt.    Feeling better this morning, breathing easier.  Telemetry shows NSR with possible short runs of atrial fibrillation.  She pulled out her central line by accident.   Scheduled Meds: . amiodarone  150 mg Intravenous Once  . antiseptic oral rinse  7 mL Mouth Rinse BID  . aspirin EC  81 mg Oral Daily  . calcium-vitamin D  1 tablet Oral Q breakfast  . cefTRIAXone (ROCEPHIN)  IV  1 g Intravenous Daily  . docusate sodium  100 mg Oral BID  . enoxaparin (LOVENOX) injection  30 mg Subcutaneous QHS  . ferrous sulfate  325 mg Oral BID WC  . insulin aspart  0-9 Units Subcutaneous TID WC  . insulin glargine  10 Units Subcutaneous QHS  . linagliptin  5 mg Oral Daily  . magnesium sulfate 1 - 4 g bolus IVPB  1 g Intravenous Once  . potassium chloride  40 mEq Oral BID  . potassium chloride  40 mEq Oral Once  . sodium chloride  10-40 mL Intracatheter Q12H   Continuous Infusions: . sodium chloride Stopped (03/19/15 0800)  . amiodarone     Followed by  . amiodarone    . furosemide (LASIX) infusion 12 mg/hr (03/20/15 0600)  . milrinone 0.25 mcg/kg/min (03/20/15 0600)   PRN Meds:.[DISCONTINUED] ondansetron **OR** ondansetron (ZOFRAN) IV, ondansetron, oxyCODONE, sodium chloride    Filed Vitals:   03/19/15 2037 03/19/15 2341 03/20/15 0308 03/20/15 0327  BP: 105/54 93/52  110/64  Pulse:  63  103  Temp: 98.7 F (37.1 C) 98.5 F (36.9 C)  98.1 F (36.7 C)  TempSrc: Oral Oral  Oral  Resp: 21 19  13   Height:      Weight:   145 lb 11.6 oz (66.1 kg)   SpO2: 96%   92%    Intake/Output Summary (Last 24 hours) at 03/20/15 0724 Last data filed at 03/20/15 0600  Gross per 24 hour  Intake    789 ml  Output   3575 ml  Net  -2786 ml    LABS: Basic Metabolic Panel:  Recent Labs   03/19/15 0340 03/19/15 1750 03/20/15 0520  NA 133* 132* 133*  K 2.8* 3.6 3.0*  CL 87* 89* 87*  CO2 37* 31 34*  GLUCOSE 152* 206* 137*  BUN 54* 50* 51*  CREATININE 2.05* 1.72* 1.59*  CALCIUM 8.8* 8.9 8.8*  MG 1.9  --   --    Liver Function Tests: No results for input(s): AST, ALT, ALKPHOS, BILITOT, PROT, ALBUMIN in the last 72 hours. No results for input(s): LIPASE, AMYLASE in the last 72 hours. CBC:  Recent Labs  03/19/15 0340  WBC 7.5  HGB 10.5*  HCT 33.6*  MCV 95.2  PLT 245   Cardiac Enzymes:  Recent Labs  03/17/15 1230  TROPONINI 0.06*   BNP: Invalid input(s): POCBNP D-Dimer: No results for input(s): DDIMER in the last 72 hours. Hemoglobin A1C: No results for input(s): HGBA1C in the last 72 hours. Fasting Lipid Panel: No results for input(s): CHOL, HDL, LDLCALC, TRIG, CHOLHDL, LDLDIRECT in the last 72 hours. Thyroid Function Tests: No results for input(s): TSH, T4TOTAL, T3FREE, THYROIDAB in the last 72 hours.  Invalid input(s): FREET3 Anemia Panel: No results for input(s): VITAMINB12, FOLATE, FERRITIN, TIBC, IRON, RETICCTPCT  in the last 72 hours.  RADIOLOGY: Dg Chest 2 View  03/16/2015   CLINICAL DATA:  Acute onset of chest pain and shortness of breath. Cough and congestion. Initial encounter.  EXAM: CHEST  2 VIEW  COMPARISON:  Chest radiograph performed 12/14/2014  FINDINGS: The lungs are well-aerated. A small left pleural effusion is noted. Chronically increased interstitial markings are noted. Mild peribronchial thickening is noted. No pneumothorax is identified. Scarring is seen at the left lung apex.  The heart is enlarged. The patient is status post median sternotomy. A valve replacement is noted. Orphaned pacemaker leads are visualized. No acute osseous abnormalities are seen.  IMPRESSION: Small left pleural effusion noted. Chronically increased interstitial markings seen. Cardiomegaly.   Electronically Signed   By: Roanna Raider M.D.   On: 03/16/2015  17:26   Ct Head Wo Contrast  03/17/2015   CLINICAL DATA:  Acute encephalopathy.  No headache.  EXAM: CT HEAD WITHOUT CONTRAST  TECHNIQUE: Contiguous axial images were obtained from the base of the skull through the vertex without intravenous contrast.  COMPARISON:  12/15/2014  FINDINGS: The ventricles are normal configuration. There is ventricular and sulcal enlargement reflecting mild to moderate age related atrophy. No hydrocephalus.  There are no parenchymal masses or mass effect. There is no evidence of a cortical infarct. Mild periventricular white matter hypoattenuation is noted consistent with chronic microvascular ischemic change.  There are no extra-axial masses or abnormal fluid collections.  No intracranial hemorrhage.  Sinuses and mastoid air cells are clear.  IMPRESSION: 1. No acute intracranial abnormalities. 2. Mild to moderate atrophy. Mild chronic microvascular ischemic change.   Electronically Signed   By: Amie Portland M.D.   On: 03/17/2015 16:35   US Abdomen Complete  03/17/2015   CLINICAL DATA:  Nausea and vomiting  EXAM: ULTRASOUND ABDOMEN COMPLETE  COMPARISON:  None.  FINDINGS: Gallbladder: Gallbladder wall thickening measuring 6 mm and pericholecystic fluid noted. Adherent sludge balls or polyps, largest 3 mm. No sonographic Murphy sign.  Common bile duct: Diameter: 7 mm  Liver: No focal lesion identified. Within normal limits in parenchymal echogenicity.  IVC: No abnormality visualized.  Pancreas: Visualized portion unremarkable.  Spleen: Size and appearance within normal limits.  Right Kidney: Length: 11.2 cm, increased renal cortical echogenicity. No hydronephrosis. Cortical cysts are noted, largest 1.5 x 1.4 x 1.4 cm.  Left Kidney: Length: 10.7 cm. Suboptimally visualized due to lack of optimal acoustic window with increased cortical echogenicity but no focal abnormality or mass. No hydronephrosis.  Abdominal aorta: No aneurysm visualized.  Other findings: Ascites and bilateral  pleural effusions are noted. Incidental note is made of bladder distention, volume 1094 cc.  IMPRESSION: Increased renal cortical echogenicity suggesting medical renal disease, with right renal cysts.  Bladder distention.  Gallbladder wall thickening, pericholecystic fluid, and adherent sludge balls or polyps. If the patient has right upper quadrant abdominal pain, findings would be highly suspicious for acute cholecystitis. However, there is ascites and gallbladder wall thickening may also be secondary to liver disease or hypoproteinemia.  Pleural effusions and ascites.   Electronically Signed   By: Christiana Pellant M.D.   On: 03/17/2015 09:30   Dg Chest Port 1 View  03/17/2015   CLINICAL DATA:  Central line placement.  EXAM: PORTABLE CHEST - 1 VIEW  COMPARISON:  03/16/2015  FINDINGS: Patient is moderately rotated to the left. Sternotomy wires and prosthetic heart valve unchanged. Evidence of a right IJ central venous catheter with tip likely over the SVC accounting for  patient's rotation. No evidence of pneumothorax. There is continued left base opacification unchanged to slightly worse likely effusions/ atelectasis and less likely infection. Mild prominence of the perihilar markings suggesting mild vascular congestion. Remainder of the exam is unchanged.  IMPRESSION: Slight worsening left base opacification likely effusion with atelectasis although cannot exclude infection.  Suggestion mild vascular congestion.  Right IJ central venous catheter with tip likely over the region of the SVC. No pneumothorax.   Electronically Signed   By: Elberta Fortis M.D.   On: 03/17/2015 12:51    PHYSICAL EXAM General: NAD Neck: JVP 12-13 cm, no thyromegaly or thyroid nodule.  Lungs: Mild crackles at bases. CV: Nondisplaced PMI.  Heart regular S1/S2, +S3, 2/6 SEM RUSB.  1+ edema 1/2 to knee on left, cast on right lower leg.   Abdomen: Soft, nontender, no hepatosplenomegaly, no distention.  Neurologic: Alert and oriented  x 3.  Psych: Normal affect. Extremities: No clubbing or cyanosis. Erythema lower leg on right.   TELEMETRY: Reviewed telemetry pt in NSR with frequent PACs, ?short atrial fibrillation runs  ASSESSMENT AND PLAN: 81 yo with history of bioprosthetic MVR/AVR, nonischemic cardiomyopathy (EF 20-25% with LBBB), and CKD was admitted with acute on chronic systolic CHF with low output/volume overloaded state.  AKI, lactate elevated.  Initially hyperkalemic with altered mental status.  1. Acute on chronic systolic CHF: Nonischemic cardiomyopathy with chronic LBBB.  EF 20-25%.  She has struggled recently with volume overload.  I recommended admission to her at last appointment, but she wanted to continue to try to manage at home.  She remains significantly volume overloaded but has made improvement, weight down again with diuresis. No co-ox or CVP this morning, pulled out CVL. Creatinine trending down.  - Replete K and Mg aggressively, BMET again in pm.  - Continue milrinone gtt 0.25 - Lasix gtt 12 mg/hr - Will have EP evaluate for CRT given wide LBBB, 1st degree AVB, and difficulty tolerating po CHF meds. She has epicardial lead from her cardiac surgery. 2. AKI on CKD: Suspect cardiorenal syndrome, some improvement so far with milrinone and diuresis, creatinine to 1.59 today.  3. ID: Treated for UTI with ceftriaxone (E coli).  4. Atrial fibrillation: Has history of post-op afib. NSR with frequent PVCs and ?short runs atrial fibrillation on milrinone.  Will use amiodarone for now to keep her in rhythm.  5. Bioprosthetic MV/AoV: Stable on last echo.  6. Fractured left ankle: Cast apparently is supposed to come off this week.  Will see if we can have this done in the hospital.   [name] 03/20/2015 7:24 AM

## 2015-03-21 ENCOUNTER — Inpatient Hospital Stay (HOSPITAL_COMMUNITY): Payer: Medicare Other

## 2015-03-21 ENCOUNTER — Encounter (HOSPITAL_COMMUNITY)
Admission: EM | Disposition: A | Discharge status: Skilled Nursing Facility | Payer: Medicare Other | Source: Home / Self Care | Attending: Cardiology

## 2015-03-21 ENCOUNTER — Encounter (HOSPITAL_COMMUNITY): Payer: Medicare Other

## 2015-03-21 DIAGNOSIS — I447 Left bundle-branch block, unspecified: Secondary | ICD-10-CM

## 2015-03-21 DIAGNOSIS — I509 Heart failure, unspecified: Secondary | ICD-10-CM

## 2015-03-21 HISTORY — PX: EP IMPLANTABLE DEVICE: SHX172B

## 2015-03-21 LAB — BASIC METABOLIC PANEL
ANION GAP: 13 (ref 5–15)
BUN: 46 mg/dL — ABNORMAL HIGH (ref 6–20)
CALCIUM: 8.9 mg/dL (ref 8.9–10.3)
CHLORIDE: 85 mmol/L — AB (ref 101–111)
CO2: 34 mmol/L — ABNORMAL HIGH (ref 22–32)
CREATININE: 2.02 mg/dL — AB (ref 0.44–1.00)
GFR, EST AFRICAN AMERICAN: 25 mL/min — AB (ref >60)
GFR, EST NON AFRICAN AMERICAN: 22 mL/min — AB (ref >60)
GLUCOSE: 132 mg/dL — AB (ref 65–99)
Potassium: 3.4 mmol/L — ABNORMAL LOW (ref 3.5–5.1)
Sodium: 132 mmol/L — ABNORMAL LOW (ref 135–145)

## 2015-03-21 LAB — GLUCOSE, CAPILLARY
GLUCOSE-CAPILLARY: 96 mg/dL (ref 65–99)
GLUCOSE-CAPILLARY: 176 mg/dL — AB (ref 65–99)
Glucose-Capillary: 87 mg/dL (ref 65–99)
Glucose-Capillary: 107 mg/dL — ABNORMAL HIGH (ref 65–99)

## 2015-03-21 LAB — CARBOXYHEMOGLOBIN
Carboxyhemoglobin: 1.7 % — ABNORMAL HIGH (ref 0.5–1.5)
METHEMOGLOBIN: 0.9 % (ref 0.0–1.5)
O2 Saturation: 70.8 %
Total hemoglobin: 10.6 g/dL — ABNORMAL LOW (ref 12.0–16.0)

## 2015-03-21 LAB — CBC
HEMATOCRIT: 35 % — AB (ref 36.0–46.0)
HEMOGLOBIN: 11.3 g/dL — AB (ref 12.0–15.0)
MCH: 30.4 pg (ref 26.0–34.0)
MCHC: 32.3 g/dL (ref 30.0–36.0)
MCV: 94.1 fL (ref 78.0–100.0)
PLATELETS: 212 10*3/uL (ref 150–400)
RBC: 3.72 MIL/uL — ABNORMAL LOW (ref 3.87–5.11)
RDW: 16.3 % — ABNORMAL HIGH (ref 11.5–15.5)
WBC: 10.5 10*3/uL (ref 4.0–10.5)

## 2015-03-21 SURGERY — BIV PACEMAKER INSERTION CRT-P
Anesthesia: LOCAL

## 2015-03-21 MED ORDER — POTASSIUM CHLORIDE 10 MEQ/100ML IV SOLN
10.0000 meq | Freq: Once | INTRAVENOUS | Status: AC
  Administered 2015-03-21: 10 meq via INTRAVENOUS
  Filled 2015-03-21: qty 100

## 2015-03-21 MED ORDER — HEPARIN (PORCINE) IN NACL 2-0.9 UNIT/ML-% IJ SOLN
INTRAMUSCULAR | Status: AC
  Filled 2015-03-21: qty 500

## 2015-03-21 MED ORDER — ACETAMINOPHEN 325 MG PO TABS
325.0000 mg | ORAL_TABLET | ORAL | Status: DC | PRN
  Administered 2015-03-22: 650 mg via ORAL
  Filled 2015-03-21: qty 2

## 2015-03-21 MED ORDER — FENTANYL CITRATE (PF) 100 MCG/2ML IJ SOLN
INTRAMUSCULAR | Status: AC
  Filled 2015-03-21: qty 2

## 2015-03-21 MED ORDER — ONDANSETRON HCL 4 MG/2ML IJ SOLN: 4.0000 mg | Freq: Four times a day (QID) | INTRAMUSCULAR | Status: DC | PRN

## 2015-03-21 MED ORDER — MIDAZOLAM HCL 5 MG/5ML IJ SOLN
INTRAMUSCULAR | Status: DC | PRN
Start: 2015-03-21 — End: 2015-03-21
  Administered 2015-03-21: 1 mg via INTRAVENOUS

## 2015-03-21 MED ORDER — LIDOCAINE HCL (PF) 1 % IJ SOLN
INTRAMUSCULAR | Status: DC | PRN
  Administered 2015-03-21: 60 mL

## 2015-03-21 MED ORDER — CEFAZOLIN SODIUM-DEXTROSE 2-3 GM-% IV SOLR
INTRAVENOUS | Status: AC
  Filled 2015-03-21: qty 50

## 2015-03-21 MED ORDER — TORSEMIDE 20 MG PO TABS
80.0000 mg | ORAL_TABLET | Freq: Every day | ORAL | Status: DC
  Filled 2015-03-21: qty 4

## 2015-03-21 MED ORDER — METOLAZONE 2.5 MG PO TABS
2.5000 mg | ORAL_TABLET | Freq: Once | ORAL | Status: AC
  Administered 2015-03-21: 2.5 mg via ORAL
  Filled 2015-03-21: qty 1

## 2015-03-21 MED ORDER — LIDOCAINE HCL (PF) 1 % IJ SOLN
INTRAMUSCULAR | Status: AC
  Filled 2015-03-21: qty 30

## 2015-03-21 MED ORDER — POTASSIUM CHLORIDE CRYS ER 20 MEQ PO TBCR: 40.0000 meq | EXTENDED_RELEASE_TABLET | Freq: Once | ORAL | Status: DC

## 2015-03-21 MED ORDER — FUROSEMIDE 10 MG/ML IJ SOLN
12.0000 mg/h | INTRAVENOUS | Status: DC
  Administered 2015-03-22 (×2): 12 mg/h via INTRAVENOUS
  Filled 2015-03-21 (×4): qty 25

## 2015-03-21 MED ORDER — POTASSIUM CHLORIDE CRYS ER 20 MEQ PO TBCR
40.0000 meq | EXTENDED_RELEASE_TABLET | Freq: Once | ORAL | Status: AC
  Administered 2015-03-21: 40 meq via ORAL

## 2015-03-21 MED ORDER — DEXTROSE 5 % IV SOLN
2.0000 g | INTRAVENOUS | Status: DC | PRN
  Administered 2015-03-21: 2 g via INTRAVENOUS

## 2015-03-21 MED ORDER — MIDAZOLAM HCL 5 MG/5ML IJ SOLN
INTRAMUSCULAR | Status: AC
  Filled 2015-03-21: qty 5

## 2015-03-21 MED ORDER — TORSEMIDE 20 MG PO TABS
80.0000 mg | ORAL_TABLET | Freq: Once | ORAL | Status: DC
  Filled 2015-03-21: qty 4

## 2015-03-21 MED ORDER — LIDOCAINE HCL (PF) 1 % IJ SOLN
INTRAMUSCULAR | Status: AC
  Filled 2015-03-21: qty 60

## 2015-03-21 MED ORDER — FENTANYL CITRATE (PF) 100 MCG/2ML IJ SOLN
INTRAMUSCULAR | Status: DC | PRN
  Administered 2015-03-21: 25 ug via INTRAVENOUS

## 2015-03-21 MED ORDER — CEFAZOLIN SODIUM 1-5 GM-% IV SOLN
1.0000 g | Freq: Four times a day (QID) | INTRAVENOUS | Status: AC
  Administered 2015-03-21 – 2015-03-22 (×3): 1 g via INTRAVENOUS
  Filled 2015-03-21 (×3): qty 50

## 2015-03-21 SURGICAL SUPPLY — 16 items
CABLE SURGICAL S-101-97-12 (CABLE) ×2 IMPLANT
CATH CPS DIRECT 135 DS2C020 (CATHETERS) ×2 IMPLANT
HEMOSTAT SURGICEL 2X4 FIBR (HEMOSTASIS) ×2 IMPLANT
KIT ESSENTIALS PG (KITS) ×2 IMPLANT
LEAD QUARTET 1458Q-86CM (Lead) ×2 IMPLANT
LEAD TENDRIL SDX 1688TC-46CM (Lead) ×2 IMPLANT
LEAD TENDRIL SDX 1688TC-52CM (Lead) ×2 IMPLANT
PACEMAKER ALLR QDR CRTP PM3242 (Pacemaker) ×1 IMPLANT
PAD DEFIB LIFELINK (PAD) ×2 IMPLANT
PPM ALLURE QUADRA CRT-P PM3242 (Pacemaker) ×2 IMPLANT
SHEATH CLASSIC 7F (SHEATH) ×4 IMPLANT
SHEATH CLASSIC 9.5F (SHEATH) ×2 IMPLANT
SHIELD RADPAD SCOOP 12X17 (MISCELLANEOUS) ×2 IMPLANT
TRAY PACEMAKER INSERTION (CUSTOM PROCEDURE TRAY) ×2 IMPLANT
WIRE ACUITY WHISPER EDS 4648 (WIRE) ×4 IMPLANT
WIRE HITORQ VERSACORE ST 145CM (WIRE) ×2 IMPLANT

## 2015-03-21 NOTE — Progress Notes (Signed)
Pts daughter bedside, pt going called to cath lab. Consent signed, pt and daughter educated and verbalized understanding. RN to escort to cath lab with pt. Family leaving, and will return after 4pm to visit pt again. Phone number in the chart. NAD, VSS.

## 2015-03-21 NOTE — Progress Notes (Addendum)
SUBJECTIVE: The patient is doing well today.  At this time, she denies chest pain, shortness of breath, or any new concerns.  Marland Kitchen antiseptic oral rinse  7 mL Mouth Rinse BID  . aspirin EC  81 mg Oral Daily  . calcium-vitamin D  1 tablet Oral Q breakfast  .  ceFAZolin (ANCEF) IV  2 g Intravenous To Cath  . cefTRIAXone (ROCEPHIN)  IV  1 g Intravenous Daily  . docusate sodium  100 mg Oral BID  . ferrous sulfate  325 mg Oral BID WC  . gentamicin irrigation  80 mg Irrigation To Cath  . insulin aspart  0-9 Units Subcutaneous TID WC  . insulin glargine  10 Units Subcutaneous QHS  . linagliptin  5 mg Oral Daily  . metolazone  2.5 mg Oral Once  . potassium chloride  40 mEq Oral BID  . potassium chloride  40 mEq Oral Once  . sodium chloride  10-40 mL Intracatheter Q12H  . sodium chloride  10-40 mL Intracatheter Q12H   . sodium chloride Stopped (03/19/15 0800)  . sodium chloride 10 mL (03/21/15 0715)  . sodium chloride    . amiodarone 30 mg/hr (03/21/15 0610)  . furosemide (LASIX) infusion    . milrinone 0.25 mcg/kg/min (03/21/15 0010)    OBJECTIVE: Physical Exam: Filed Vitals:   03/20/15 1934 03/20/15 2335 03/21/15 0400 03/21/15 0600  BP: 91/41 91/74 95/61    Pulse:  98 90   Temp: 97.8 F (36.6 C) 97.7 F (36.5 C) 97.8 F (36.6 C)   TempSrc: Oral Oral Oral   Resp: 21 15 14    Height:      Weight:    139 lb 12.4 oz (63.4 kg)  SpO2:  95% 96%     Intake/Output Summary (Last 24 hours) at 03/21/15 0748 Last data filed at 03/21/15 9179  Gross per 24 hour  Intake 884.73 ml  Output   2735 ml  Net -1850.27 ml    Telemetry reveals sinus rhythm  GEN- The patient is elderly and chronically ill appearing, alert and oriented x 3 today.   Head- normocephalic, atraumatic Eyes-  Sclera clear, conjunctiva pink Ears- hearing intact Oropharynx- clear Neck- supple, +JVP Lymph- no cervical lymphadenopathy Lungs- Clear to ausculation bilaterally, normal work of breathing Heart-  Regular rate and rhythm, 2/6 SEM GI- soft, NT, ND, + BS Extremities- no clubbing, cyanosis, 1+ edema, right LUE PICC, cast RLE Skin- no rash or lesion Psych- euthymic mood, full affect Neuro- strength and sensation are intact  LABS: Basic Metabolic Panel:  Recent Labs  15/05/69 0340  03/20/15 1625 03/21/15 0340  NA 133*  < > 131* 132*  K 2.8*  < > 3.6 3.4*  CL 87*  < > 83* 85*  CO2 37*  < > 35* 34*  GLUCOSE 152*  < > 156* 132*  BUN 54*  < > 47* 46*  CREATININE 2.05*  < > 1.80* 2.02*  CALCIUM 8.8*  < > 9.1 8.9  MG 1.9  --   --   --   < > = values in this interval not displayed. CBC:  Recent Labs  03/19/15 0340 03/21/15 0340  WBC 7.5 10.5  HGB 10.5* 11.3*  HCT 33.6* 35.0*  MCV 95.2 94.1  PLT 245 212    RADIOLOGY: Dg Chest 2 View 03/16/2015   CLINICAL DATA:  Acute onset of chest pain and shortness of breath. Cough and congestion. Initial encounter.  EXAM: CHEST  2 VIEW  COMPARISON:  Chest radiograph performed 12/14/2014  FINDINGS: The lungs are well-aerated. A small left pleural effusion is noted. Chronically increased interstitial markings are noted. Mild peribronchial thickening is noted. No pneumothorax is identified. Scarring is seen at the left lung apex.  The heart is enlarged. The patient is status post median sternotomy. A valve replacement is noted. Orphaned pacemaker leads are visualized. No acute osseous abnormalities are seen.  IMPRESSION: Small left pleural effusion noted. Chronically increased interstitial markings seen. Cardiomegaly.   Electronically Signed   By: Roanna Raider M.D.   On: 03/16/2015 17:26   ASSESSMENT AND PLAN:  Principal Problem:   Cardiogenic shock Active Problems:   Chronic systolic heart failure   S/P aortic and mitral valve replacement with bioprosthetic valves   PAF (paroxysmal atrial fibrillation)   Hyperkalemia   Nausea & vomiting   Renal failure (ARF), acute on chronic   Acute systolic congestive heart failure, NYHA class 4    Hypoglycemia   Lactic acid acidosis  1. NICM/chronic systolic heart failure The patient has a NICM and has been admitted with heart failure exacerbation. She has a baseline LBBB with QRS of . Medical therapy is limited by hypotension. . Plan CRTP implant today - the patient has epicardial LV leads that were placed in 2014 at time of valve surgery Dr Graciela Husbands has discussed procedure with patient last night  2. Paroxysmal atrial fibrillation She has had brief paroxysms of atrial fibrillation on telemetry in the setting of milrinone therapy.  With enlarged LA, likelihood of recurrent AF is higher.  CHADS2VASC is at least 9.  Will monitor AF burden with pacemaker.   3. Valvular heart disease Stable on last echo  4. Acute renal insufficiency Improved since admission  Gypsy Balsam, NP 03/21/2015 7:51 AM  Will discuss with Dr DM  My incliniation is to proceed with  worseingin renal issues not  Likely to get better unless cardiac performance improves, and that option is CRT and we can hopefully pirate the LV leads and/or minimize contrast  Replete K

## 2015-03-21 NOTE — Progress Notes (Signed)
Spoke with Dr DM regarding renal function he suggests taht Cr is probably near baseline and suggested we proceed with the admonition to minimize contrast which we will hopefully be able to do given the epicardial placed leads  reveiweed with patient

## 2015-03-21 NOTE — Progress Notes (Addendum)
Pt arrived back from cath lab, s/p pacer insertion. NAD, VSS, pt AAo4. Daughter Doristine Johns has spoke to dr Graciela Husbands, and is sitting bedside at this time. Will cont to monitor closley. Pt voices she has no pain at this time. Pt verbalizes understanding of sling on left arm, not to pull arm away from body d/t pacer insertion, and risks. BP cuff placed on LUA d/t RUA PICC, LLE cellulitis, RLE cast.

## 2015-03-21 NOTE — Progress Notes (Signed)
Orthopedic Tech Progress Note Patient Details:  Joanna [name] [birth year] [id number] Patient already has arm sling on. Patient ID: JAKALA CORLETO, female   DOB: 1930-01-05, 81 y.o.   MRN: [id number]   Jennye Moccasin 03/21/2015, 6:24 PM

## 2015-03-21 NOTE — Progress Notes (Signed)
Patient ID: Joanna [name], female   DOB: 1929/12/24, 81 y.o.   MRN: [id number]   SUBJECTIVE:   Admitted with volume overload, AKI, low output physiology with elevated lactate.  She was begun on milrinone gtt.  She had short runs atrial fibrillation on milrinone and has been on amiodarone as well, in NSR currently.   PICC is in place, CVP 13-14.  Co-ox 71% but creatinine back up to 2 this morning.  She is lying flat and is comfortable.   Scheduled Meds: . antiseptic oral rinse  7 mL Mouth Rinse BID  . aspirin EC  81 mg Oral Daily  . calcium-vitamin D  1 tablet Oral Q breakfast  .  ceFAZolin (ANCEF) IV  2 g Intravenous To Cath  . cefTRIAXone (ROCEPHIN)  IV  1 g Intravenous Daily  . docusate sodium  100 mg Oral BID  . ferrous sulfate  325 mg Oral BID WC  . gentamicin irrigation  80 mg Irrigation To Cath  . insulin aspart  0-9 Units Subcutaneous TID WC  . insulin glargine  10 Units Subcutaneous QHS  . linagliptin  5 mg Oral Daily  . metolazone  2.5 mg Oral Once  . potassium chloride  40 mEq Oral BID  . potassium chloride  40 mEq Oral Once  . sodium chloride  10-40 mL Intracatheter Q12H  . sodium chloride  10-40 mL Intracatheter Q12H   Continuous Infusions: . sodium chloride Stopped (03/19/15 0800)  . sodium chloride 10 mL (03/21/15 0715)  . sodium chloride    . amiodarone 30 mg/hr (03/21/15 0610)  . furosemide (LASIX) infusion    . milrinone 0.25 mcg/kg/min (03/21/15 0010)   PRN Meds:.[DISCONTINUED] ondansetron **OR** ondansetron (ZOFRAN) IV, ondansetron, oxyCODONE, sodium chloride, sodium chloride    Filed Vitals:   03/20/15 1934 03/20/15 2335 03/21/15 0400 03/21/15 0600  BP:   Pulse:  98 90   Temp: 97.8 F (36.6 C) 97.7 F (36.5 C) 97.8 F (36.6 C)   TempSrc: Oral Oral Oral   Resp: Height:      Weight:    139 lb 12.4 oz (63.4 kg)  SpO2:  95% 96%     Intake/Output Summary (Last 24 hours) at 03/21/15 0718 Last data filed at 03/21/15 0612  Gross per 24 hour  Intake 884.73 ml  Output   2735 ml  Net -1850.27 ml    LABS: Basic Metabolic Panel:  Recent Labs  40/98/11 0340  03/20/15 1625 03/21/15 0340  NA 133*  < > 131* 132*  K 2.8*  < > 3.6 3.4*  CL 87*  < > 83* 85*  CO2 37*  < > 35* 34*  GLUCOSE 152*  < > 156* 132*  BUN 54*  < > 47* 46*  CREATININE 2.05*  < > 1.80* 2.02*  CALCIUM 8.8*  < > 9.1 8.9  MG 1.9  --   --   --   < > = values in this interval not displayed. Liver Function Tests: No results for input(s): AST, ALT, ALKPHOS, BILITOT, PROT, ALBUMIN in the last 72 hours. No results for input(s): LIPASE, AMYLASE in the last 72 hours. CBC:  Recent Labs  03/19/15 0340 03/21/15 0340  WBC 7.5 10.5  HGB 10.5* 11.3*  HCT 33.6* 35.0*  MCV 95.2 94.1  PLT 245 212   Cardiac Enzymes: No results for input(s): CKTOTAL, CKMB, CKMBINDEX, TROPONINI in the last 72 hours. BNP: Invalid input(s): POCBNP D-Dimer: No results  for input(s): DDIMER in the last 72 hours. Hemoglobin A1C: No results for input(s): HGBA1C in the last 72 hours. Fasting Lipid Panel: No results for input(s): CHOL, HDL, LDLCALC, TRIG, CHOLHDL, LDLDIRECT in the last 72 hours. Thyroid Function Tests: No results for input(s): TSH, T4TOTAL, T3FREE, THYROIDAB in the last 72 hours.  Invalid input(s): FREET3 Anemia Panel: No results for input(s): VITAMINB12, FOLATE, FERRITIN, TIBC, IRON, RETICCTPCT in the last 72 hours.  RADIOLOGY: Dg Chest 2 View  03/16/2015   CLINICAL DATA:  Acute onset of chest pain and shortness of breath. Cough and congestion. Initial encounter.  EXAM: CHEST  2 VIEW  COMPARISON:  Chest radiograph performed 12/14/2014  FINDINGS: The lungs are well-aerated. A small left pleural effusion is noted. Chronically increased interstitial markings are noted. Mild peribronchial thickening is noted. No pneumothorax is identified. Scarring is seen at the left lung apex.  The heart is enlarged. The patient is status post median sternotomy. A  valve replacement is noted. Orphaned pacemaker leads are visualized. No acute osseous abnormalities are seen.  IMPRESSION: Small left pleural effusion noted. Chronically increased interstitial markings seen. Cardiomegaly.   Electronically Signed   By: Roanna Raider M.D.   On: 03/16/2015 17:26   Ct Head Wo Contrast  03/17/2015   CLINICAL DATA:  Acute encephalopathy.  No headache.  EXAM: CT HEAD WITHOUT CONTRAST  TECHNIQUE: Contiguous axial images were obtained from the base of the skull through the vertex without intravenous contrast.  COMPARISON:  12/15/2014  FINDINGS: The ventricles are normal configuration. There is ventricular and sulcal enlargement reflecting mild to moderate age related atrophy. No hydrocephalus.  There are no parenchymal masses or mass effect. There is no evidence of a cortical infarct. Mild periventricular white matter hypoattenuation is noted consistent with chronic microvascular ischemic change.  There are no extra-axial masses or abnormal fluid collections.  No intracranial hemorrhage.  Sinuses and mastoid air cells are clear.  IMPRESSION: 1. No acute intracranial abnormalities. 2. Mild to moderate atrophy. Mild chronic microvascular ischemic change.   Electronically Signed   By: Amie Portland M.D.   On: 03/17/2015 16:35   US Abdomen Complete  03/17/2015   CLINICAL DATA:  Nausea and vomiting  EXAM: ULTRASOUND ABDOMEN COMPLETE  COMPARISON:  None.  FINDINGS: Gallbladder: Gallbladder wall thickening measuring 6 mm and pericholecystic fluid noted. Adherent sludge balls or polyps, largest 3 mm. No sonographic Murphy sign.  Common bile duct: Diameter: 7 mm  Liver: No focal lesion identified. Within normal limits in parenchymal echogenicity.  IVC: No abnormality visualized.  Pancreas: Visualized portion unremarkable.  Spleen: Size and appearance within normal limits.  Right Kidney: Length: 11.2 cm, increased renal cortical echogenicity. No hydronephrosis. Cortical cysts are noted, largest  1.5 x 1.4 x 1.4 cm.  Left Kidney: Length: 10.7 cm. Suboptimally visualized due to lack of optimal acoustic window with increased cortical echogenicity but no focal abnormality or mass. No hydronephrosis.  Abdominal aorta: No aneurysm visualized.  Other findings: Ascites and bilateral pleural effusions are noted. Incidental note is made of bladder distention, volume 1094 cc.  IMPRESSION: Increased renal cortical echogenicity suggesting medical renal disease, with right renal cysts.  Bladder distention.  Gallbladder wall thickening, pericholecystic fluid, and adherent sludge balls or polyps. If the patient has right upper quadrant abdominal pain, findings would be highly suspicious for acute cholecystitis. However, there is ascites and gallbladder wall thickening may also be secondary to liver disease or hypoproteinemia.  Pleural effusions and ascites.   Electronically Signed  By: Christiana Pellant M.D.   On: 03/17/2015 09:30   Dg Chest Port 1 View  03/20/2015   CLINICAL DATA:  PICC line placement  EXAM: PORTABLE CHEST - 1 VIEW  COMPARISON:  03/17/2015  FINDINGS: Right PICC line is in place. The tip is in the SVC. There is cardiomegaly. Devitalized left pacer wires remain in place, unchanged. Prior valve replacement. Left lower lobe opacity with left effusion again noted, unchanged.  IMPRESSION: Right PICC line tip in the SVC.  No change in the left lower lobe opacity and left effusion.   Electronically Signed   By: Charlett Nose M.D.   On: 03/20/2015 11:14   Dg Chest Port 1 View  03/17/2015   CLINICAL DATA:  Central line placement.  EXAM: PORTABLE CHEST - 1 VIEW  COMPARISON:  03/16/2015  FINDINGS: Patient is moderately rotated to the left. Sternotomy wires and prosthetic heart valve unchanged. Evidence of a right IJ central venous catheter with tip likely over the SVC accounting for patient's rotation. No evidence of pneumothorax. There is continued left base opacification unchanged to slightly worse likely  effusions/ atelectasis and less likely infection. Mild prominence of the perihilar markings suggesting mild vascular congestion. Remainder of the exam is unchanged.  IMPRESSION: Slight worsening left base opacification likely effusion with atelectasis although cannot exclude infection.  Suggestion mild vascular congestion.  Right IJ central venous catheter with tip likely over the region of the SVC. No pneumothorax.   Electronically Signed   By: Elberta Fortis M.D.   On: 03/17/2015 12:51    PHYSICAL EXAM General: NAD Neck: JVP 12-13 cm, no thyromegaly or thyroid nodule.  Lungs: Mild crackles at bases. CV: Nondisplaced PMI.  Heart regular S1/S2, +S3, 2/6 SEM RUSB.  1+ edema 1/2 to knee on left, cast on right lower leg.   Abdomen: Soft, nontender, no hepatosplenomegaly, no distention.  Neurologic: Alert and oriented x 3.  Psych: Normal affect. Extremities: No clubbing or cyanosis. Erythema lower leg on right.   TELEMETRY: Reviewed telemetry pt in NSR with frequent PACs, short atrial fibrillation runs  ASSESSMENT AND PLAN: 81 yo with history of bioprosthetic MVR/AVR, nonischemic cardiomyopathy (EF 20-25% with LBBB), and CKD was admitted with acute on chronic systolic CHF with low output/volume overloaded state.  AKI, lactate elevated.  Initially hyperkalemic with altered mental status.  1. Acute on chronic systolic CHF: Nonischemic cardiomyopathy with chronic LBBB.  EF 20-25%.  She has struggled recently with volume overload.  I recommended admission to her at last appointment, but she wanted to continue to try to manage at home.  She remains significantly volume overloaded but has made improvement, weight down again with diuresis. CVP 13-14 this morning, co-ox 72%. Creatinine trended back up to 2.  - Replete K and Mg aggressively. Repeat BMET in pm.  - Continue milrinone gtt 0.25 - Lasix gtt 12 mg/hr, will add metolazone 2.5 mg x 1 => hopefully creatinine will remain stable with decongestion.  -  Plan today for CRT placement.  2. AKI on CKD: Suspect cardiorenal syndrome, creatinine back to 2 (lower than admission).  Hopefully will stabilize with decongestion but will follow closely. 3. ID: Treated for UTI with ceftriaxone (E coli).  4. Atrial fibrillation: Has history of post-op afib. NSR with frequent PVCs and ?short runs atrial fibrillation on milrinone.  Will use amiodarone for now to keep her in rhythm.  5. Bioprosthetic MV/AoV: Stable on last echo.  6. Fractured left ankle: Cast apparently is supposed to come off  this week.  Will see if we can have this done in the hospital.  7. Atrial fibrillation: Paroxysmal.  Maintaining NSR on amiodarone.  Will need to discuss anticoagulation with daughter.  Significant fall risk based on recent fall with right ankle fracture.   [name] 03/21/2015 7:18 AM

## 2015-03-22 ENCOUNTER — Encounter (HOSPITAL_COMMUNITY): Payer: Self-pay | Admitting: Internal Medicine

## 2015-03-22 DIAGNOSIS — I447 Left bundle-branch block, unspecified: Secondary | ICD-10-CM | POA: Insufficient documentation

## 2015-03-22 DIAGNOSIS — I428 Other cardiomyopathies: Secondary | ICD-10-CM | POA: Insufficient documentation

## 2015-03-22 LAB — BASIC METABOLIC PANEL
ANION GAP: 13 (ref 5–15)
BUN: 43 mg/dL — ABNORMAL HIGH (ref 6–20)
CO2: 33 mmol/L — ABNORMAL HIGH (ref 22–32)
CREATININE: 1.79 mg/dL — AB (ref 0.44–1.00)
Calcium: 8.5 mg/dL — ABNORMAL LOW (ref 8.9–10.3)
Chloride: 82 mmol/L — ABNORMAL LOW (ref 101–111)
GFR calc Af Amer: 29 mL/min — ABNORMAL LOW (ref >60)
GFR, EST NON AFRICAN AMERICAN: 25 mL/min — AB (ref >60)
GLUCOSE: 341 mg/dL — AB (ref 65–99)
Potassium: 3.8 mmol/L (ref 3.5–5.1)
Sodium: 128 mmol/L — ABNORMAL LOW (ref 135–145)

## 2015-03-22 LAB — CBC
HCT: 33.5 % — ABNORMAL LOW (ref 36.0–46.0)
HEMOGLOBIN: 10.6 g/dL — AB (ref 12.0–15.0)
MCH: 30.1 pg (ref 26.0–34.0)
MCHC: 31.6 g/dL (ref 30.0–36.0)
MCV: 95.2 fL (ref 78.0–100.0)
Platelets: 199 10*3/uL (ref 150–400)
RBC: 3.52 MIL/uL — ABNORMAL LOW (ref 3.87–5.11)
RDW: 16.5 % — ABNORMAL HIGH (ref 11.5–15.5)
WBC: 10.5 10*3/uL (ref 4.0–10.5)

## 2015-03-22 LAB — GLUCOSE, CAPILLARY
Glucose-Capillary: 195 mg/dL — ABNORMAL HIGH (ref 65–99)
Glucose-Capillary: 219 mg/dL — ABNORMAL HIGH (ref 65–99)
Glucose-Capillary: 190 mg/dL — ABNORMAL HIGH (ref 65–99)
Glucose-Capillary: 174 mg/dL — ABNORMAL HIGH (ref 65–99)

## 2015-03-22 LAB — CARBOXYHEMOGLOBIN
Carboxyhemoglobin: 1.5 % (ref 0.5–1.5)
Methemoglobin: 0.9 % (ref 0.0–1.5)
O2 Saturation: 60.2 %
Total hemoglobin: 10.7 g/dL — ABNORMAL LOW (ref 12.0–16.0)

## 2015-03-22 MED ORDER — ISOSORBIDE MONONITRATE 15 MG HALF TABLET
15.0000 mg | ORAL_TABLET | Freq: Every day | ORAL | Status: DC
  Administered 2015-03-22 – 2015-03-30 (×9): 15 mg via ORAL
  Filled 2015-03-22 (×9): qty 1

## 2015-03-22 MED ORDER — POTASSIUM CHLORIDE CRYS ER 20 MEQ PO TBCR
40.0000 meq | EXTENDED_RELEASE_TABLET | Freq: Once | ORAL | Status: AC
  Administered 2015-03-22: 40 meq via ORAL

## 2015-03-22 MED ORDER — METOLAZONE 2.5 MG PO TABS
2.5000 mg | ORAL_TABLET | Freq: Once | ORAL | Status: AC
  Administered 2015-03-22: 2.5 mg via ORAL
  Filled 2015-03-22: qty 1

## 2015-03-22 MED ORDER — AMIODARONE HCL 200 MG PO TABS
400.0000 mg | ORAL_TABLET | Freq: Two times a day (BID) | ORAL | Status: DC
  Administered 2015-03-22 – 2015-03-23 (×4): 400 mg via ORAL
  Filled 2015-03-22 (×6): qty 2

## 2015-03-22 MED ORDER — HYDRALAZINE HCL 10 MG PO TABS
10.0000 mg | ORAL_TABLET | Freq: Three times a day (TID) | ORAL | Status: DC
Start: 2015-03-22 — End: 2015-03-30
  Administered 2015-03-22 – 2015-03-30 (×24): 10 mg via ORAL
  Filled 2015-03-22 (×28): qty 1

## 2015-03-22 MED FILL — Heparin Sodium (Porcine) 2 Unit/ML in Sodium Chloride 0.9%: INTRAMUSCULAR | Qty: 500 | Status: AC

## 2015-03-22 NOTE — Progress Notes (Signed)
Patient Name: Joanna [name]      SUBJECTIVE:breathing much better  Past Medical History  Diagnosis Date  . Peripheral artery disease   . Hyperlipidemia        . Arthritis   . Aortic stenosis      a. s/p AVR  . BUNDLE BRANCH BLOCK, LEFT    . CAROTID ARTERY STENOSIS 01/05/2009  . CVA 03/14/2010  . PERIPHERAL VASCULAR DISEASE 07/30/2007  . Chronic combined systolic and diastolic CHF (congestive heart failure)     a. RHC 09/2011 RA 8, RV 58/2/11; PA 54/22 (37) PCWP 20; F CO/CI 4.57/2.67 PVR 3.7; b. L main no sig dz, LAD luminal irreg; LCx lg Ramus with luminal irreg, small AV Lcx witout sig dz, RCA luminal irreg; severe mitral annular calcifications; AV calcified c. EF 45-50% (07/2013);  d. 11/2014 Echo: EF 20-25%, diff HK, nl AV/MV, sev dil LA, mod TR/PR, PASP .  . Diabetes mellitus   . HTN (hypertension)   . Iron deficiency anemia 09/21/2011  . History of shingles   . Mitral regurgitation     a. s/p MVR  . S/P aortic valve replacement with bioprosthetic valve 06/15/2013    21 mm [facility] Ease bovine pericardial tissue valve  . S/P mitral valve replacement with bioprosthetic valve 06/15/2013    25 mm [facility] Mitral bovine pericardial tissue valve  . Fracture, fibula, shaft 01/30/2015    right    Scheduled Meds:  Scheduled Meds: . antiseptic oral rinse  7 mL Mouth Rinse BID  . aspirin EC  81 mg Oral Daily  . calcium-vitamin D  1 tablet Oral Q breakfast  . cefTRIAXone (ROCEPHIN)  IV  1 g Intravenous Daily  . docusate sodium  100 mg Oral BID  . ferrous sulfate  325 mg Oral BID WC  . insulin aspart  0-9 Units Subcutaneous TID WC  . insulin glargine  10 Units Subcutaneous QHS  . linagliptin  5 mg Oral Daily  . potassium chloride  40 mEq Oral BID  . sodium chloride  10-40 mL Intracatheter Q12H  . sodium chloride  10-40 mL Intracatheter Q12H   Continuous Infusions: . sodium chloride Stopped (03/19/15 0800)  . amiodarone 30 mg/hr (03/22/15 0615)  .  furosemide (LASIX) infusion 12 mg/hr (03/22/15 0011)  . milrinone 0.25 mcg/kg/min (03/22/15 0000)   acetaminophen, [DISCONTINUED] ondansetron **OR** ondansetron (ZOFRAN) IV, ondansetron, oxyCODONE, sodium chloride, sodium chloride    PHYSICAL EXAM Filed Vitals:   03/22/15 0000 03/22/15 0200 03/22/15 0357 03/22/15 0400  BP: 110/54   112/47  Pulse: 90 88 81 86  Temp: 97.8 F (36.6 C)  97.9 F (36.6 C)   TempSrc: Oral  Oral   Resp: 17 20 20 18   Height:      Weight:  61.5 kg (135 lb 9.3 oz)    SpO2: 97% 96% 92% 96%   Well developed and nourished in no acute distress HENT normal Neck supple  Clear  Regular rate and rhythm, no murmurs or gallops Abd-soft with active BS No Clubbing cyanosis edema Skin-warm and dry A & Oriented  Grossly normal sensory and motor function  TELEMETRY: Reviewed telemetry pt in P-synchronous/ AV  pacing    Intake/Output Summary (Last 24 hours) at 03/22/15 0710 Last data filed at 03/22/15 0600  Gross per 24 hour  Intake  905.7 ml  Output   3110 ml  Net -2204.3 ml    LABS: Basic Metabolic Panel:  Recent Labs Lab  03/18/15 1520 03/19/15 0340 03/19/15 1750 03/20/15 0520 03/20/15 1625 03/21/15 0340 03/22/15 0443  NA 134* 133* 132* 133* 131* 132* 128*  K 3.6 2.8* 3.6 3.0* 3.6 3.4* 3.8  CL 87* 87* 89* 87* 83* 85* 82*  CO2 36* 37* 31 34* 35* 34* 33*  GLUCOSE 227* 152* 206* 137* 156* 132* 341*  BUN 60* 54* 50* 51* 47* 46* 43*  CREATININE 2.18* 2.05* 1.72* 1.59* 1.80* 2.02* 1.79*  CALCIUM 8.9 8.8* 8.9 8.8* 9.1 8.9 8.5*  MG  --  1.9  --   --   --   --   --    Cardiac Enzymes: No results for input(s): CKTOTAL, CKMB, CKMBINDEX, TROPONINI in the last 72 hours. CBC:  Recent Labs Lab 03/16/15 1619 03/17/15 0147 03/19/15 0340 03/21/15 0340 03/22/15 0443  WBC 10.3 12.3* 7.5 10.5 10.5  NEUTROABS 8.4* 9.9*  --   --   --   HGB 14.5 12.7 10.5* 11.3* 10.6*  HCT 45.9 39.3 33.6* 35.0* 33.5*  MCV 96.2 95.2 95.2 94.1 95.2  PLT 315 275 245 212  199   PROTIME: No results for input(s): LABPROT, INR in the last 72 hours. Liver Function Tests: No results for input(s): AST, ALT, ALKPHOS, BILITOT, PROT, ALBUMIN in the last 72 hours. No results for input(s): LIPASE, AMYLASE in the last 72 hours. BNP: BNP (last 3 results)  Recent Labs  02/26/15 1245 03/13/15 1028 03/16/15 1619  BNP 4185.3* 4332.8* >4500.0*    ProBNP (last 3 results)  Post implant CXR was reviewed and demonstrates stable lead position without evidence of pneumothorax  Device Interrogation normal device function    ASSESSMENT AND PLAN:    Active Problems:   NICM f    S/P aortic and mitral valve replacement with bioprosthetic valves   PAF (paroxysmal atrial fibrillation)   Renal failure (ARF), acute on chronic   Acute systolic congestive heart failure, NYHA class 3   HypoNatremia LBBB  S/p CRT yday with improved renal function this am and IMPROVED BP Will change amio>>PO Diuretics per CHF  Signed, Sherryl Manges MD  03/22/2015

## 2015-03-22 NOTE — Evaluation (Signed)
Physical Therapy Evaluation Patient Details Name: Joanna [name] MRN: [id number] DOB: 22-Nov-1929 Today's Date: 03/22/2015   History of Present Illness  Joanna [name] is a 81 y.o. 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 This admission s/p cardiogenic shock with pacemaker replacement 6/1  Clinical Impression  Pt familiar from last admission and at this point has not had orthopedic involvement this admission but potential case removal acutely. Pt currently still TDWB on RLE, unable to maintain weight bearing status and needs constant cues.  Pt with decreased strength, function and adherence to precautions who will benefit from acute therapy to maximize mobility, function, strength and transfers to decrease burden of care.    Follow Up Recommendations SNF;Supervision/Assistance - 24 hour    Equipment Recommendations  None recommended by PT    Recommendations for Other Services       Precautions / Restrictions Precautions Precautions: Fall;ICD/Pacemaker Restrictions Weight Bearing Restrictions: Yes RLE Weight Bearing: Touchdown weight bearing      Mobility  Bed Mobility Overal bed mobility: +2 for physical assistance;Needs Assistance Bed Mobility: Sit to Supine       Sit to supine: Max assist;+2 for physical assistance   General bed mobility comments: Pt with 2 person assist to pivot into bed, elevate legs and control descent without pt putting weight through LUE  Transfers Overall transfer level: Needs assistance Equipment used: 2 person hand held assist Transfers: Sit to/from [employer] Sit to Stand: Mod assist;+2 safety/equipment;+2 physical assistance   Squat pivot transfers: Max assist;+2 physical assistance;+2 safety/equipment     General transfer comment: pt  with cues for RUE to hook therapist arm, standing on LLE with RLE supported on P.T. foot to prevent weight bearing. Max assist to pivot pelvis to bed for transfer. Pt continually trying to push through RLE or reach with LUE and max cues for precautions and safety  Ambulation/Gait                Stairs            Wheelchair Mobility    Modified Rankin (Stroke Patients Only)       Balance Overall balance assessment: Needs assistance   Sitting balance-Leahy Scale: Fair       Standing balance-Leahy Scale: Poor                               Pertinent Vitals/Pain Pain Assessment: No/denies pain  Hr 87 sats 96% RA 99/53    Home Living Family/patient expects to be discharged to:: Skilled nursing facility Living Arrangements: Alone Available Help at Discharge: Available PRN/intermittently;Family Type of Home: House Home Access: Stairs to enter Entrance Stairs-Rails: Can reach both   Home Layout: One level Home Equipment: Environmental consultant - 2 wheels;Gilmer Mor - single point Additional Comments: Pt was living home alone prior to fall and fx in April. since then has been at [employer] with 2-3 person assist for pivot to chair due to TDWB status RLE    Prior Function Level of Independence: Needs assistance         Comments: pt reports several recent falls, dgtr comes by to check on her a few times a week and son works fulltime, no 24hr assist available- prior to April admission     Hand Dominance  Extremity/Trunk Assessment   Upper Extremity Assessment: Generalized weakness           Lower Extremity Assessment: Generalized weakness      Cervical / Trunk Assessment: Kyphotic  Communication   Communication: No difficulties  Cognition Arousal/Alertness: Awake/alert Behavior During Therapy: WFL for tasks assessed/performed         Memory: Decreased recall of precautions              General Comments      Exercises         Assessment/Plan    PT Assessment Patient needs continued PT services  PT Diagnosis Difficulty walking;Generalized weakness   PT Problem List Decreased strength;Decreased cognition;Decreased activity tolerance;Decreased balance;Decreased knowledge of precautions;Decreased mobility  PT Treatment Interventions Functional mobility training;Therapeutic activities;Therapeutic exercise;Balance training;Patient/family education   PT Goals (Current goals can be found in the Care Plan section) Acute Rehab PT Goals Patient Stated Goal: be able to walk and go home PT Goal Formulation: With patient Time For Goal Achievement: 04/05/15 Potential to Achieve Goals: Fair    Frequency Min 3X/week   Barriers to discharge Decreased caregiver support      Co-evaluation               End of Session Equipment Utilized During Treatment: Gait belt;Other (comment) (sling LUE, cast RLE) Activity Tolerance: Patient tolerated treatment well Patient left: in bed;with call bell/phone within reach Nurse Communication: Mobility status;Precautions;Weight bearing status         Time: 0841-0900 PT Time Calculation (min) (ACUTE ONLY): 19 min   Charges:   PT Evaluation $Initial PT Evaluation Tier I: 1 Procedure     PT G CodesDelorse Lek 03/22/2015, 9:39 AM Delaney Meigs, PT [phone]

## 2015-03-22 NOTE — Op Note (Signed)
Joanna [name], Joanna [name]                ACCOUNT NO.:  [id number]  MEDICAL RECORD NO.:  [id number]  LOCATION:  2H24C                        FACILITY:  [facility]  PHYSICIAN:  Duke Salvia, MD, FACCDATE OF BIRTH:  1930-07-02  DATE OF PROCEDURE:  03/21/2015 DATE OF DISCHARGE:                              OPERATIVE REPORT   PREOPERATIVE DIAGNOSES:  Congestive heart failure, left bundle-branch block, ischemic cardiomyopathy with previously implanted epicardial leads.  POSTOPERATIVE DIAGNOSES:  Congestive heart failure, left bundle-branch block, ischemic cardiomyopathy with previously implanted epicardial leads.  PROCEDURE:  Extraction of the previously implanted epicardial leads and re-capping of them, insertion of a dual-chamber pacemaker with left ventricular lead placement.  PROCEDURE IN DETAIL:  Following obtaining informed consent, the patient was brought to electrophysiology laboratory and placed on the fluoroscopic table in supine position.  After routine prep and drape of the left upper chest, lidocaine was infiltrated along the line of the pectoral groove.  Incision was made and carried down to the prepectoral fascia using electrocautery and sharp dissection.  The pocket was extended caudal and we were able to remove the previously implanted capped LV electrodes.  We then uncapped them and measured the QLV and was approximately 35%.  It was elected to try to place a transvenous LV lead. We then turned our attention to gain access to the extrathoracic left subclavian vein, which was accomplished without difficulty, without the aspiration of air or puncture of the artery.  Three separate venipunctures were accomplished.  Guidewires were placed and retained and sequentially a St. Jude, 52 cm, active fixation ventricular lead, serial #[id number], a St. Jude 135CS cannulation catheter and a St. Jude 1688 46 cm active fixation atrial lead, serial #[id number] serial [id number].  The RV lead was  deployed to the RV apex and significant CR was noted. In its apical location, the bipolar amplitude was 14 with a pace impedance of 568, a threshold 0.5 V at 0.5 milliseconds, current threshold 0.8 mA.  There is no diaphragmatic pacing at 10 V.  The current of injury is brisk.  The lead was secured to the prepectoral fascia.  We then deployed the atrial lead into the right atrial appendage.  In this location, the bipolar P-wave was 1.5 with a pace impedance of 423, a threshold 1.2 V at 0.5 milliseconds.  Current threshold 2.7 mA.  There is no diaphragmatic pacing at 10 V. This lead was secured to the prepectoral fascia.  We were able to gain ready access to the coronary sinus.  We then used a Wooley wire, and identified a high lateral branch.  When we advanced the Whisper wire into the coronary sinus, on it's own found a mid posterolateral branch where we deployed a St. Jude 1458 lead to the junction between the distal and mid 3rd.  In this location, the bipolar L wave in the 3 take-in configuration as 2 and 1 were both associated with diaphragmatic stimulation.  We had a threshold 0.75 V at 0.5 milliseconds.  Impedance was 330 and there is no diaphragmatic pacing at 10 V.  The leads were then attached to a St. Jude Allure [id number] device serial [id number].  The QLV on this  location was about 0.5-0.6.  The epicardial leads were previously exposed were recapped.  The leads in the pulse generator placed in the pocket with care that all the leads and the capped leads were placed behind the can.  Surgicel was placed on the posterior aspect and the cephalad aspect of the pocket and the leads in the pulse generator were secured to the prepectoral fascia.  The wound was closed in 2 layers in normal fashion.  The wound was washed, dried, and a Dermabond dressing was applied.  Needle counts, sponge counts, and instrument counts were correct at the end of the procedure according to the staff.  The  patient tolerated the procedure without apparent complication except for pain in her legs, which predated the procedure.     Duke Salvia, MD, [facility]     SCK/MEDQ  D:  03/21/2015  T:  03/22/2015  Job:  [id number]

## 2015-03-22 NOTE — Progress Notes (Signed)
Patient assisted OOB into chair with 3 person assist. When getting OOB did a stand up weight. Patient very unsteady on feet with cast on right lower leg. Patient stated she feels much better OOB. Left arm protected with sling in place. Will continue to monitor closely.

## 2015-03-22 NOTE — Progress Notes (Signed)
Patient ID: Joanna [name], female   DOB: May 11, 1930, 81 y.o.   MRN: [id number]   SUBJECTIVE:   Admitted with volume overload, AKI, low output physiology with elevated lactate.  She was begun on milrinone gtt.  She had short runs atrial fibrillation on milrinone and has been on amiodarone as well, in NSR currently.   St Jude CRT-D placed 6/1, BP higher.  She diuresed well yesterday, CVP 12-13 this morning with co-ox 60%.  Breathing is good at rest.   Scheduled Meds: . amiodarone  400 mg Oral BID  . antiseptic oral rinse  7 mL Mouth Rinse BID  . aspirin EC  81 mg Oral Daily  . calcium-vitamin D  1 tablet Oral Q breakfast  . cefTRIAXone (ROCEPHIN)  IV  1 g Intravenous Daily  . docusate sodium  100 mg Oral BID  . ferrous sulfate  325 mg Oral BID WC  . hydrALAZINE  10 mg Oral 3 times per day  . insulin aspart  0-9 Units Subcutaneous TID WC  . insulin glargine  10 Units Subcutaneous QHS  . isosorbide mononitrate  15 mg Oral Daily  . linagliptin  5 mg Oral Daily  . metolazone  2.5 mg Oral Once  . potassium chloride  40 mEq Oral BID  . potassium chloride  40 mEq Oral Once  . sodium chloride  10-40 mL Intracatheter Q12H  . sodium chloride  10-40 mL Intracatheter Q12H   Continuous Infusions: . sodium chloride Stopped (03/19/15 0800)  . furosemide (LASIX) infusion 12 mg/hr (03/22/15 0011)  . milrinone 0.25 mcg/kg/min (03/22/15 0000)   PRN Meds:.acetaminophen, [DISCONTINUED] ondansetron **OR** ondansetron (ZOFRAN) IV, ondansetron, oxyCODONE, sodium chloride, sodium chloride    Filed Vitals:   03/22/15 0000 03/22/15 0200 03/22/15 0357 03/22/15 0400  BP: 110/54   112/47  Pulse: 90 88 81 86  Temp: 97.8 F (36.6 C)  97.9 F (36.6 C)   TempSrc: Oral  Oral   Resp: Height:      Weight:  135 lb 9.3 oz (61.5 kg)    SpO2: 97% 96% 92% 96%    Intake/Output Summary (Last 24 hours) at 03/22/15 0725 Last data filed at 03/22/15 0600  Gross per 24 hour  Intake  905.7 ml  Output    3110 ml  Net -2204.3 ml    LABS: Basic Metabolic Panel:  Recent Labs  40/98/11 0340 03/22/15 0443  NA 132* 128*  K 3.4* 3.8  CL 85* 82*  CO2 34* 33*  GLUCOSE 132* 341*  BUN 46* 43*  CREATININE 2.02* 1.79*  CALCIUM 8.9 8.5*   Liver Function Tests: No results for input(s): AST, ALT, ALKPHOS, BILITOT, PROT, ALBUMIN in the last 72 hours. No results for input(s): LIPASE, AMYLASE in the last 72 hours. CBC:  Recent Labs  03/21/15 0340 03/22/15 0443  WBC 10.5 10.5  HGB 11.3* 10.6*  HCT 35.0* 33.5*  MCV 94.1 95.2  PLT 212 199   Cardiac Enzymes: No results for input(s): CKTOTAL, CKMB, CKMBINDEX, TROPONINI in the last 72 hours. BNP: Invalid input(s): POCBNP D-Dimer: No results for input(s): DDIMER in the last 72 hours. Hemoglobin A1C: No results for input(s): HGBA1C in the last 72 hours. Fasting Lipid Panel: No results for input(s): CHOL, HDL, LDLCALC, TRIG, CHOLHDL, LDLDIRECT in the last 72 hours. Thyroid Function Tests: No results for input(s): TSH, T4TOTAL, T3FREE, THYROIDAB in the last 72 hours.  Invalid input(s): FREET3 Anemia Panel: No results for input(s): VITAMINB12, FOLATE, FERRITIN, TIBC,  IRON, RETICCTPCT in the last 72 hours.  RADIOLOGY: Dg Chest 2 View  03/16/2015   CLINICAL DATA:  Acute onset of chest pain and shortness of breath. Cough and congestion. Initial encounter.  EXAM: CHEST  2 VIEW  COMPARISON:  Chest radiograph performed 12/14/2014  FINDINGS: The lungs are well-aerated. A small left pleural effusion is noted. Chronically increased interstitial markings are noted. Mild peribronchial thickening is noted. No pneumothorax is identified. Scarring is seen at the left lung apex.  The heart is enlarged. The patient is status post median sternotomy. A valve replacement is noted. Orphaned pacemaker leads are visualized. No acute osseous abnormalities are seen.  IMPRESSION: Small left pleural effusion noted. Chronically increased interstitial markings seen.  Cardiomegaly.   Electronically Signed   By: Roanna Raider M.D.   On: 03/16/2015 17:26   Ct Head Wo Contrast  03/17/2015   CLINICAL DATA:  Acute encephalopathy.  No headache.  EXAM: CT HEAD WITHOUT CONTRAST  TECHNIQUE: Contiguous axial images were obtained from the base of the skull through the vertex without intravenous contrast.  COMPARISON:  12/15/2014  FINDINGS: The ventricles are normal configuration. There is ventricular and sulcal enlargement reflecting mild to moderate age related atrophy. No hydrocephalus.  There are no parenchymal masses or mass effect. There is no evidence of a cortical infarct. Mild periventricular white matter hypoattenuation is noted consistent with chronic microvascular ischemic change.  There are no extra-axial masses or abnormal fluid collections.  No intracranial hemorrhage.  Sinuses and mastoid air cells are clear.  IMPRESSION: 1. No acute intracranial abnormalities. 2. Mild to moderate atrophy. Mild chronic microvascular ischemic change.   Electronically Signed   By: Amie Portland M.D.   On: 03/17/2015 16:35   US Abdomen Complete  03/17/2015   CLINICAL DATA:  Nausea and vomiting  EXAM: ULTRASOUND ABDOMEN COMPLETE  COMPARISON:  None.  FINDINGS: Gallbladder: Gallbladder wall thickening measuring 6 mm and pericholecystic fluid noted. Adherent sludge balls or polyps, largest 3 mm. No sonographic Murphy sign.  Common bile duct: Diameter: 7 mm  Liver: No focal lesion identified. Within normal limits in parenchymal echogenicity.  IVC: No abnormality visualized.  Pancreas: Visualized portion unremarkable.  Spleen: Size and appearance within normal limits.  Right Kidney: Length: 11.2 cm, increased renal cortical echogenicity. No hydronephrosis. Cortical cysts are noted, largest 1.5 x 1.4 x 1.4 cm.  Left Kidney: Length: 10.7 cm. Suboptimally visualized due to lack of optimal acoustic window with increased cortical echogenicity but no focal abnormality or mass. No hydronephrosis.   Abdominal aorta: No aneurysm visualized.  Other findings: Ascites and bilateral pleural effusions are noted. Incidental note is made of bladder distention, volume 1094 cc.  IMPRESSION: Increased renal cortical echogenicity suggesting medical renal disease, with right renal cysts.  Bladder distention.  Gallbladder wall thickening, pericholecystic fluid, and adherent sludge balls or polyps. If the patient has right upper quadrant abdominal pain, findings would be highly suspicious for acute cholecystitis. However, there is ascites and gallbladder wall thickening may also be secondary to liver disease or hypoproteinemia.  Pleural effusions and ascites.   Electronically Signed   By: Christiana Pellant M.D.   On: 03/17/2015 09:30   Dg Chest Port 1 View  03/21/2015   CLINICAL DATA:  Pacemaker insertion today. Postprocedural radiograph.  EXAM: PORTABLE CHEST - 1 VIEW  COMPARISON:  03/20/2015.  FINDINGS: Support apparatus: Monitoring leads project over the chest. New pacemaker power pack with 3 LEFT subclavian leads. Leads appear in appropriate position with coronary sinus lead.  Aortic valve replacements are present. Old epicardial pacing leads are present.  The cardiopericardial silhouette remains enlarged. There is bilateral interstitial and mild alveolar airspace disease compatible with pulmonary edema/volume overload. Aortic arch atherosclerosis.  IMPRESSION:  1. Interval placement of 3 lead LEFT subclavian pacemaker and power pack. 2. Negative for pneumothorax or complicating features. 3. Mild interstitial and alveolar pulmonary edema.   Electronically Signed   By: Andreas Newport M.D.   On: 03/21/2015 17:02   Dg Chest Port 1 View  03/20/2015   CLINICAL DATA:  PICC line placement  EXAM: PORTABLE CHEST - 1 VIEW  COMPARISON:  03/17/2015  FINDINGS: Right PICC line is in place. The tip is in the SVC. There is cardiomegaly. Devitalized left pacer wires remain in place, unchanged. Prior valve replacement. Left lower lobe  opacity with left effusion again noted, unchanged.  IMPRESSION: Right PICC line tip in the SVC.  No change in the left lower lobe opacity and left effusion.   Electronically Signed   By: Charlett Nose M.D.   On: 03/20/2015 11:14   Dg Chest Port 1 View  03/17/2015   CLINICAL DATA:  Central line placement.  EXAM: PORTABLE CHEST - 1 VIEW  COMPARISON:  03/16/2015  FINDINGS: Patient is moderately rotated to the left. Sternotomy wires and prosthetic heart valve unchanged. Evidence of a right IJ central venous catheter with tip likely over the SVC accounting for patient's rotation. No evidence of pneumothorax. There is continued left base opacification unchanged to slightly worse likely effusions/ atelectasis and less likely infection. Mild prominence of the perihilar markings suggesting mild vascular congestion. Remainder of the exam is unchanged.  IMPRESSION: Slight worsening left base opacification likely effusion with atelectasis although cannot exclude infection.  Suggestion mild vascular congestion.  Right IJ central venous catheter with tip likely over the region of the SVC. No pneumothorax.   Electronically Signed   By: Elberta Fortis M.D.   On: 03/17/2015 12:51    PHYSICAL EXAM General: NAD Neck: JVP 12-13 cm, no thyromegaly or thyroid nodule.  Lungs: Mild crackles at bases. CV: Nondisplaced PMI.  Heart regular S1/S2, +S3, 2/6 SEM RUSB.  1+ edema 1/2 to knee on left, cast on right lower leg.   Abdomen: Soft, nontender, no hepatosplenomegaly, no distention.  Neurologic: Alert and oriented x 3.  Psych: Normal affect. Extremities: No clubbing or cyanosis. Erythema lower leg on right.   TELEMETRY: Reviewed telemetry pt in NSR with frequent PACs, short atrial fibrillation runs  ASSESSMENT AND PLAN: 81 yo with history of bioprosthetic MVR/AVR, nonischemic cardiomyopathy (EF 20-25% with LBBB), and CKD was admitted with acute on chronic systolic CHF with low output/volume overloaded state.  AKI, lactate  elevated.  Initially hyperkalemic with altered mental status.  1. Acute on chronic systolic CHF: Nonischemic cardiomyopathy with chronic LBBB.  EF 20-25%.  She has struggled recently with volume overload.  I recommended admission to her at last appointment, but she wanted to continue to try to manage at home.  She remains significantly volume overloaded but has made improvement, weight down 4 lbs again with diuresis. CVP 12-13 this morning, co-ox 60%. Creatinine down to 1.79.  St Jude CRT-D placed yesterday, BP better.   - Replete K..  - Continue milrinone gtt 0.25 - Lasix gtt 12 mg/hr, will add metolazone 2.5 mg x 1 again => hopefully creatinine will remain stable with decongestion.  - Add low dose hydralazine/nitrates.  2. AKI on CKD: Suspect cardiorenal syndrome, creatinine down to 1.79 (lower than  admission).  Hopefully will stabilize with decongestion but will follow closely. 3. ID: Treated for UTI with ceftriaxone (E coli).  4. Atrial fibrillation: Paroxysmal.  Has history of post-op afib. NSR with frequent PVCs and short runs atrial fibrillation on milrinone, consistently in NSR on amiodarone.  Will use amiodarone for now to keep her in rhythm.  Will transition off ASA 81 onto low dose Eliquis prior to discharge, will not start today with fresh pacemaker pocket.  5. Bioprosthetic MV/AoV: Stable on last echo.  6. Fractured left ankle: Cast apparently is supposed to come off this week.  Will see if we can have this done in the hospital.   [name] 03/22/2015 7:25 AM

## 2015-03-22 NOTE — Progress Notes (Signed)
PT Cancellation Note  Patient Details Name: Joanna [name] MRN: [id number] DOB: 05-20-1930   Cancelled Treatment:    Reason Eval/Treat Not Completed: Other (comment) (pt in chair eating. Pt TDWB RLE from ORIF of the ankle and will attempt later)   Toney Sang [facility] 03/22/2015, 8:23 AM Delaney Meigs, PT [phone]

## 2015-03-23 ENCOUNTER — Inpatient Hospital Stay (HOSPITAL_COMMUNITY): Payer: Medicare Other

## 2015-03-23 LAB — CULTURE, BLOOD (ROUTINE X 2)
Culture: NO GROWTH
Culture: NO GROWTH

## 2015-03-23 LAB — BASIC METABOLIC PANEL
Anion gap: 13 (ref 5–15)
BUN: 38 mg/dL — ABNORMAL HIGH (ref 6–20)
CALCIUM: 9.1 mg/dL (ref 8.9–10.3)
CO2: 35 mmol/L — ABNORMAL HIGH (ref 22–32)
CREATININE: 1.58 mg/dL — AB (ref 0.44–1.00)
Chloride: 85 mmol/L — ABNORMAL LOW (ref 101–111)
GFR calc non Af Amer: 29 mL/min — ABNORMAL LOW (ref >60)
GFR, EST AFRICAN AMERICAN: 34 mL/min — AB (ref >60)
GLUCOSE: 153 mg/dL — AB (ref 65–99)
POTASSIUM: 2.8 mmol/L — AB (ref 3.5–5.1)
SODIUM: 133 mmol/L — AB (ref 135–145)

## 2015-03-23 LAB — GLUCOSE, CAPILLARY
Glucose-Capillary: 91 mg/dL (ref 65–99)
Glucose-Capillary: 201 mg/dL — ABNORMAL HIGH (ref 65–99)
Glucose-Capillary: 116 mg/dL — ABNORMAL HIGH (ref 65–99)
Glucose-Capillary: 207 mg/dL — ABNORMAL HIGH (ref 65–99)

## 2015-03-23 LAB — CBC
HCT: 31.8 % — ABNORMAL LOW (ref 36.0–46.0)
Hemoglobin: 10.3 g/dL — ABNORMAL LOW (ref 12.0–15.0)
MCH: 30.3 pg (ref 26.0–34.0)
MCHC: 32.4 g/dL (ref 30.0–36.0)
MCV: 93.5 fL (ref 78.0–100.0)
PLATELETS: 168 10*3/uL (ref 150–400)
RBC: 3.4 MIL/uL — ABNORMAL LOW (ref 3.87–5.11)
RDW: 16.5 % — ABNORMAL HIGH (ref 11.5–15.5)
WBC: 11.4 10*3/uL — ABNORMAL HIGH (ref 4.0–10.5)

## 2015-03-23 LAB — CARBOXYHEMOGLOBIN
CARBOXYHEMOGLOBIN: 2.1 % — AB (ref 0.5–1.5)
Carboxyhemoglobin: 1.8 % — ABNORMAL HIGH (ref 0.5–1.5)
Methemoglobin: 1 % (ref 0.0–1.5)
Methemoglobin: 1 % (ref 0.0–1.5)
O2 Saturation: 62 %
O2 Saturation: 64.4 %
Total hemoglobin: 10.9 g/dL — ABNORMAL LOW (ref 12.0–16.0)
Total hemoglobin: 9.7 g/dL — ABNORMAL LOW (ref 12.0–16.0)

## 2015-03-23 MED ORDER — FUROSEMIDE 80 MG PO TABS
80.0000 mg | ORAL_TABLET | Freq: Every day | ORAL | Status: DC
  Administered 2015-03-23: 80 mg via ORAL
  Filled 2015-03-23: qty 1
  Filled 2015-03-23: qty 2
  Filled 2015-03-23: qty 1

## 2015-03-23 MED ORDER — POTASSIUM CHLORIDE CRYS ER 20 MEQ PO TBCR
40.0000 meq | EXTENDED_RELEASE_TABLET | Freq: Once | ORAL | Status: AC
  Administered 2015-03-23: 40 meq via ORAL
  Filled 2015-03-23: qty 2

## 2015-03-23 NOTE — Clinical Social Work Note (Signed)
Clinical Social Work Assessment  Patient Details  Name: Joanna [name] MRN: [id number] Date of Birth: Feb 19, 1930  Date of referral:  03/23/15               Reason for consult:  Facility Placement                Permission sought to share information with:  Oceanographer granted to share information::  Yes, Verbal Permission Granted  Name::        Agency::  [place] [facility]  Relationship::     Contact Information:     Housing/Transportation Living arrangements for the past 2 months:  Single Family Home Source of Information:  Patient Patient Interpreter Needed:  None Criminal Activity/Legal Involvement Pertinent to Current Situation/Hospitalization:  No - Comment as needed Significant Relationships:  Adult Children Lives with:  Self (states that dtr stays with her sometimes) Do you feel safe going back to the place where you live?  Yes Need for family participation in patient care:  Yes (Comment)  Care giving concerns:  Pt usually stays alone but her daughter lives in town and will stay with her at times   Office manager / plan:  CSW discussed PT recommendation for SNF  Employment status:  Retired Health and safety inspector:  Medicare PT Recommendations:  Skilled Nursing Facility Information / Referral to community resources:  Skilled Nursing Facility  Patient/Family's Response to care:  Pt is agreeable and understands that she would not be safe returning home straight from the hospital  Patient/Family's Understanding of and Emotional Response to Diagnosis, Current Treatment, and Prognosis:  Pt is very accepting of treatment/prognosis   Emotional Assessment Appearance:  Appears stated age Attitude/Demeanor/Rapport:    Affect (typically observed):  Calm, Accepting, Appropriate Orientation:  Oriented to Self, Oriented to Place, Oriented to  Time, Oriented to Situation Alcohol / Substance use:  Not Applicable Psych involvement (Current and  /or in the community):  No (Comment)  Discharge Needs  Concerns to be addressed:  Discharge Planning Concerns Readmission within the last 30 days:  No Current discharge risk:  Physical Impairment Barriers to Discharge:  Continued Medical Work up   [employer], [place], LCSW 03/23/2015, 1:22 PM

## 2015-03-23 NOTE — Progress Notes (Signed)
Orthopedic Tech Progress Note Patient Details:  Joanna [name] August 06, 1930 [id number]  Casting Cast Location: rle Cast Intervention: Removal  Cam walker boot; rle   Nikki Dom 03/23/2015, 4:23 PM

## 2015-03-23 NOTE — Progress Notes (Signed)
Spoke with Dr Carolyne Littles MD, regarding cast.  Xrays obtained, read by Dr Ophelia Charter, and telephone ordered entered.  Pt cast was removed by ortho tech per order, CAM boot placed in room.  Boot to be worn anytime pt out of bed, with walker.  Order placed for front wheel walker.  Will discuss with daughter whether pt has one at home, or needs one ordered.  Pt ankle looks good, with a lateral healing incision covered by steri strips about 10cm long, and another 4cm incision on the medial aspect of the ankle covered in steri strips.  Both incisions left open to air as they are dry and appear to be scabbed over.  Pt to be weight bearing as tolerated with CAM boot on and using walker to amulate.  Eliane Decree, RN

## 2015-03-23 NOTE — Progress Notes (Signed)
Patient ID: Joanna [name], female   DOB: May 11, 1930, 81 y.o.   MRN: [id number]   SUBJECTIVE:   Admitted with volume overload, AKI, low output physiology with elevated lactate.  She was begun on milrinone gtt.  She had short runs atrial fibrillation on milrinone and has been on amiodarone as well, in NSR currently.   St Jude CRT-D placed 6/1.  She diuresed well yesterday, CVP down to 6 this morning with co-ox 62%.  Breathing is good at rest. Creatinine has improved, down to 1.58.   Scheduled Meds: . amiodarone  400 mg Oral BID  . antiseptic oral rinse  7 mL Mouth Rinse BID  . aspirin EC  81 mg Oral Daily  . calcium-vitamin D  1 tablet Oral Q breakfast  . cefTRIAXone (ROCEPHIN)  IV  1 g Intravenous Daily  . docusate sodium  100 mg Oral BID  . ferrous sulfate  325 mg Oral BID WC  . furosemide  80 mg Oral Daily  . hydrALAZINE  10 mg Oral 3 times per day  . insulin aspart  0-9 Units Subcutaneous TID WC  . insulin glargine  10 Units Subcutaneous QHS  . isosorbide mononitrate  15 mg Oral Daily  . linagliptin  5 mg Oral Daily  . potassium chloride  40 mEq Oral BID  . potassium chloride  40 mEq Oral Once  . sodium chloride  10-40 mL Intracatheter Q12H  . sodium chloride  10-40 mL Intracatheter Q12H   Continuous Infusions: . sodium chloride Stopped (03/19/15 0800)  . milrinone 0.25 mcg/kg/min (03/22/15 2243)   PRN Meds:.acetaminophen, [DISCONTINUED] ondansetron **OR** ondansetron (ZOFRAN) IV, ondansetron, oxyCODONE, sodium chloride, sodium chloride    Filed Vitals:   03/23/15 0355 03/23/15 0400 03/23/15 0445 03/23/15 0630  BP:  96/53  94/65  Pulse:  91 85 87  Temp: 97.9 F (36.6 C)     TempSrc: Oral     Resp:  17 16 16   Height:      Weight: 134 lb 11.2 oz (61.1 kg)     SpO2:  95% 95% 93%    Intake/Output Summary (Last 24 hours) at 03/23/15 0727 Last data filed at 03/23/15 0634  Gross per 24 hour  Intake 1063.14 ml  Output   3756 ml  Net -2692.86 ml    LABS: Basic  Metabolic Panel:  Recent Labs  40/98/11 0443 03/23/15 0500  NA 128* 133*  K 3.8 2.8*  CL 82* 85*  CO2 33* 35*  GLUCOSE 341* 153*  BUN 43* 38*  CREATININE 1.79* 1.58*  CALCIUM 8.5* 9.1   Liver Function Tests: No results for input(s): AST, ALT, ALKPHOS, BILITOT, PROT, ALBUMIN in the last 72 hours. No results for input(s): LIPASE, AMYLASE in the last 72 hours. CBC:  Recent Labs  03/22/15 0443 03/23/15 0500  WBC 10.5 11.4*  HGB 10.6* 10.3*  HCT 33.5* 31.8*  MCV 95.2 93.5  PLT 199 168   Cardiac Enzymes: No results for input(s): CKTOTAL, CKMB, CKMBINDEX, TROPONINI in the last 72 hours. BNP: Invalid input(s): POCBNP D-Dimer: No results for input(s): DDIMER in the last 72 hours. Hemoglobin A1C: No results for input(s): HGBA1C in the last 72 hours. Fasting Lipid Panel: No results for input(s): CHOL, HDL, LDLCALC, TRIG, CHOLHDL, LDLDIRECT in the last 72 hours. Thyroid Function Tests: No results for input(s): TSH, T4TOTAL, T3FREE, THYROIDAB in the last 72 hours.  Invalid input(s): FREET3 Anemia Panel: No results for input(s): VITAMINB12, FOLATE, FERRITIN, TIBC, IRON, RETICCTPCT in the last 72 hours.  RADIOLOGY: Dg Chest 2 View  03/16/2015   CLINICAL DATA:  Acute onset of chest pain and shortness of breath. Cough and congestion. Initial encounter.  EXAM: CHEST  2 VIEW  COMPARISON:  Chest radiograph performed 12/14/2014  FINDINGS: The lungs are well-aerated. A small left pleural effusion is noted. Chronically increased interstitial markings are noted. Mild peribronchial thickening is noted. No pneumothorax is identified. Scarring is seen at the left lung apex.  The heart is enlarged. The patient is status post median sternotomy. A valve replacement is noted. Orphaned pacemaker leads are visualized. No acute osseous abnormalities are seen.  IMPRESSION: Small left pleural effusion noted. Chronically increased interstitial markings seen. Cardiomegaly.   Electronically Signed   By:  Roanna Raider M.D.   On: 03/16/2015 17:26   Ct Head Wo Contrast  03/17/2015   CLINICAL DATA:  Acute encephalopathy.  No headache.  EXAM: CT HEAD WITHOUT CONTRAST  TECHNIQUE: Contiguous axial images were obtained from the base of the skull through the vertex without intravenous contrast.  COMPARISON:  12/15/2014  FINDINGS: The ventricles are normal configuration. There is ventricular and sulcal enlargement reflecting mild to moderate age related atrophy. No hydrocephalus.  There are no parenchymal masses or mass effect. There is no evidence of a cortical infarct. Mild periventricular white matter hypoattenuation is noted consistent with chronic microvascular ischemic change.  There are no extra-axial masses or abnormal fluid collections.  No intracranial hemorrhage.  Sinuses and mastoid air cells are clear.  IMPRESSION: 1. No acute intracranial abnormalities. 2. Mild to moderate atrophy. Mild chronic microvascular ischemic change.   Electronically Signed   By: Amie Portland M.D.   On: 03/17/2015 16:35   US Abdomen Complete  03/17/2015   CLINICAL DATA:  Nausea and vomiting  EXAM: ULTRASOUND ABDOMEN COMPLETE  COMPARISON:  None.  FINDINGS: Gallbladder: Gallbladder wall thickening measuring 6 mm and pericholecystic fluid noted. Adherent sludge balls or polyps, largest 3 mm. No sonographic Murphy sign.  Common bile duct: Diameter: 7 mm  Liver: No focal lesion identified. Within normal limits in parenchymal echogenicity.  IVC: No abnormality visualized.  Pancreas: Visualized portion unremarkable.  Spleen: Size and appearance within normal limits.  Right Kidney: Length: 11.2 cm, increased renal cortical echogenicity. No hydronephrosis. Cortical cysts are noted, largest 1.5 x 1.4 x 1.4 cm.  Left Kidney: Length: 10.7 cm. Suboptimally visualized due to lack of optimal acoustic window with increased cortical echogenicity but no focal abnormality or mass. No hydronephrosis.  Abdominal aorta: No aneurysm visualized.  Other  findings: Ascites and bilateral pleural effusions are noted. Incidental note is made of bladder distention, volume 1094 cc.  IMPRESSION: Increased renal cortical echogenicity suggesting medical renal disease, with right renal cysts.  Bladder distention.  Gallbladder wall thickening, pericholecystic fluid, and adherent sludge balls or polyps. If the patient has right upper quadrant abdominal pain, findings would be highly suspicious for acute cholecystitis. However, there is ascites and gallbladder wall thickening may also be secondary to liver disease or hypoproteinemia.  Pleural effusions and ascites.   Electronically Signed   By: Christiana Pellant M.D.   On: 03/17/2015 09:30   Dg Chest Port 1 View  03/21/2015   CLINICAL DATA:  Pacemaker insertion today. Postprocedural radiograph.  EXAM: PORTABLE CHEST - 1 VIEW  COMPARISON:  03/20/2015.  FINDINGS: Support apparatus: Monitoring leads project over the chest. New pacemaker power pack with 3 LEFT subclavian leads. Leads appear in appropriate position with coronary sinus lead. Aortic valve replacements are present. Old epicardial pacing  leads are present.  The cardiopericardial silhouette remains enlarged. There is bilateral interstitial and mild alveolar airspace disease compatible with pulmonary edema/volume overload. Aortic arch atherosclerosis.  IMPRESSION:  1. Interval placement of 3 lead LEFT subclavian pacemaker and power pack. 2. Negative for pneumothorax or complicating features. 3. Mild interstitial and alveolar pulmonary edema.   Electronically Signed   By: Andreas Newport M.D.   On: 03/21/2015 17:02   Dg Chest Port 1 View  03/20/2015   CLINICAL DATA:  PICC line placement  EXAM: PORTABLE CHEST - 1 VIEW  COMPARISON:  03/17/2015  FINDINGS: Right PICC line is in place. The tip is in the SVC. There is cardiomegaly. Devitalized left pacer wires remain in place, unchanged. Prior valve replacement. Left lower lobe opacity with left effusion again noted,  unchanged.  IMPRESSION: Right PICC line tip in the SVC.  No change in the left lower lobe opacity and left effusion.   Electronically Signed   By: Charlett Nose M.D.   On: 03/20/2015 11:14   Dg Chest Port 1 View  03/17/2015   CLINICAL DATA:  Central line placement.  EXAM: PORTABLE CHEST - 1 VIEW  COMPARISON:  03/16/2015  FINDINGS: Patient is moderately rotated to the left. Sternotomy wires and prosthetic heart valve unchanged. Evidence of a right IJ central venous catheter with tip likely over the SVC accounting for patient's rotation. No evidence of pneumothorax. There is continued left base opacification unchanged to slightly worse likely effusions/ atelectasis and less likely infection. Mild prominence of the perihilar markings suggesting mild vascular congestion. Remainder of the exam is unchanged.  IMPRESSION: Slight worsening left base opacification likely effusion with atelectasis although cannot exclude infection.  Suggestion mild vascular congestion.  Right IJ central venous catheter with tip likely over the region of the SVC. No pneumothorax.   Electronically Signed   By: Elberta Fortis M.D.   On: 03/17/2015 12:51    PHYSICAL EXAM General: NAD Neck: JVP 8-9 cm, no thyromegaly or thyroid nodule.  Lungs: Mild crackles at bases. CV: Nondisplaced PMI.  Heart regular S1/S2, 2/6 SEM RUSB.  1+ ankle edema on left, cast on right lower leg.   Abdomen: Soft, nontender, no hepatosplenomegaly, no distention.  Neurologic: Alert and oriented x 3.  Psych: Normal affect. Extremities: No clubbing or cyanosis. Erythema lower leg on right.   TELEMETRY: Reviewed telemetry pt in NSR with frequent PACs, short atrial fibrillation runs  ASSESSMENT AND PLAN: 81 yo with history of bioprosthetic MVR/AVR, nonischemic cardiomyopathy (EF 20-25% with LBBB), and CKD was admitted with acute on chronic systolic CHF with low output/volume overloaded state.  AKI, lactate elevated.  Initially hyperkalemic with altered mental  status.  1. Acute on chronic systolic CHF: Nonischemic cardiomyopathy with chronic LBBB.  EF 20-25%.  She has struggled recently with volume overload.  I recommended admission to her at last appointment, but she wanted to continue to try to manage at home.  Good diuresis yesterday, CVP down to 6. Co-ox 62%. Creatinine down to 1.58.  St Jude CRT-D placed 6/1, SBP in 90s.  - Replete K, repeat BMET in pm.  - Stop Lasix gtt, will write for Lasix 80 mg po once daily, may need higher dose but will follow.  - Begin to attempt titrating down milrinone => decrease to 0.125 today.  - Continue low dose hydralazine/nitrates, do not have BP room to increase at this time.  2. AKI on CKD: Suspect cardiorenal syndrome, creatinine down to 1.58 (lower than admission).  3.  ID: Treated for UTI with ceftriaxone (E coli).  4. Atrial fibrillation: Paroxysmal.  Has history of post-op afib. NSR with frequent PVCs and short runs atrial fibrillation on milrinone, consistently in NSR on amiodarone.  Will use amiodarone for now to keep her in rhythm.  Will transition off ASA 81 onto low dose Eliquis prior to discharge, hold off today with fresh pacemaker pocket.  5. Bioprosthetic MV/AoV: Stable on last echo.  6. Fractured left ankle: Will need to arrange to get the cast off 7. PT.   [name] 03/23/2015 7:27 AM

## 2015-03-23 NOTE — Clinical Social Work Placement (Signed)
   CLINICAL SOCIAL WORK PLACEMENT  NOTE  Date:  03/23/2015  Patient Details  Name: Joanna [name] MRN: [id number] Date of Birth: 1929/11/26  Clinical Social Work is seeking post-discharge placement for this patient at the Skilled  Nursing Facility level of care (*CSW will initial, date and re-position this form in  chart as items are completed):  Yes   Patient/family provided with [facility] Clinical Social Work Department's list of facilities offering this level of care within the geographic area requested by the patient (or if unable, by the patient's family).  Yes   Patient/family informed of their freedom to choose among providers that offer the needed level of care, that participate in Medicare, Medicaid or managed care program needed by the patient, have an available bed and are willing to accept the patient.  Yes   Patient/family informed of [facility]'s ownership interest in [facility] and [facility], as well as of the fact that they are under no obligation to receive care at these facilities.  PASRR submitted to EDS on       PASRR number received on       Existing PASRR number confirmed on 03/23/15     FL2 transmitted to all facilities in geographic area requested by pt/family on 03/23/15     FL2 transmitted to all facilities within larger geographic area on       Patient informed that his/her managed care company has contracts with or will negotiate with certain facilities, including the following:            Patient/family informed of bed offers received.  Patient chooses bed at       Physician recommends and patient chooses bed at      Patient to be transferred to   on  .  Patient to be transferred to facility by       Patient family notified on   of transfer.  Name of family member notified:        PHYSICIAN Please sign FL2     Additional Comment:    _______________________________________________ Izora Ribas, LCSW 03/23/2015, 1:24 PM

## 2015-03-23 NOTE — Progress Notes (Signed)
Spoke with Dr Ophelia Charter, Orthopedist, regarding pt RLE.  Xrays

## 2015-03-24 LAB — CBC
HCT: 33.5 % — ABNORMAL LOW (ref 36.0–46.0)
HEMOGLOBIN: 10.8 g/dL — AB (ref 12.0–15.0)
MCH: 30.3 pg (ref 26.0–34.0)
MCHC: 32.2 g/dL (ref 30.0–36.0)
MCV: 93.8 fL (ref 78.0–100.0)
Platelets: 180 10*3/uL (ref 150–400)
RBC: 3.57 MIL/uL — ABNORMAL LOW (ref 3.87–5.11)
RDW: 16.1 % — AB (ref 11.5–15.5)
WBC: 11.6 10*3/uL — ABNORMAL HIGH (ref 4.0–10.5)

## 2015-03-24 LAB — CARBOXYHEMOGLOBIN
CARBOXYHEMOGLOBIN: 1.8 % — AB (ref 0.5–1.5)
CARBOXYHEMOGLOBIN: 2.1 % — AB (ref 0.5–1.5)
METHEMOGLOBIN: 0.8 % (ref 0.0–1.5)
Methemoglobin: 1 % (ref 0.0–1.5)
O2 Saturation: 54.9 %
O2 Saturation: 53 %
Total hemoglobin: 14.2 g/dL (ref 12.0–16.0)
Total hemoglobin: 11.2 g/dL — ABNORMAL LOW (ref 12.0–16.0)

## 2015-03-24 LAB — BASIC METABOLIC PANEL
Anion gap: 11 (ref 5–15)
BUN: 38 mg/dL — ABNORMAL HIGH (ref 6–20)
CO2: 35 mmol/L — ABNORMAL HIGH (ref 22–32)
Calcium: 9.3 mg/dL (ref 8.9–10.3)
Chloride: 88 mmol/L — ABNORMAL LOW (ref 101–111)
Creatinine, Ser: 1.74 mg/dL — ABNORMAL HIGH (ref 0.44–1.00)
GFR calc non Af Amer: 26 mL/min — ABNORMAL LOW (ref >60)
GFR, EST AFRICAN AMERICAN: 30 mL/min — AB (ref >60)
Glucose, Bld: 87 mg/dL (ref 65–99)
Potassium: 3.2 mmol/L — ABNORMAL LOW (ref 3.5–5.1)
SODIUM: 134 mmol/L — AB (ref 135–145)

## 2015-03-24 LAB — GLUCOSE, CAPILLARY
GLUCOSE-CAPILLARY: 194 mg/dL — AB (ref 65–99)
Glucose-Capillary: 75 mg/dL (ref 65–99)
Glucose-Capillary: 127 mg/dL — ABNORMAL HIGH (ref 65–99)
Glucose-Capillary: 142 mg/dL — ABNORMAL HIGH (ref 65–99)

## 2015-03-24 MED ORDER — AMIODARONE HCL 200 MG PO TABS
200.0000 mg | ORAL_TABLET | Freq: Two times a day (BID) | ORAL | Status: DC
  Administered 2015-03-24 – 2015-03-30 (×13): 200 mg via ORAL
  Filled 2015-03-24 (×14): qty 1

## 2015-03-24 MED ORDER — FUROSEMIDE 80 MG PO TABS: 80.0000 mg | ORAL_TABLET | Freq: Two times a day (BID) | ORAL | Status: DC

## 2015-03-24 MED ORDER — POTASSIUM CHLORIDE CRYS ER 20 MEQ PO TBCR
60.0000 meq | EXTENDED_RELEASE_TABLET | Freq: Two times a day (BID) | ORAL | Status: DC
  Administered 2015-03-24 – 2015-03-26 (×6): 60 meq via ORAL
  Filled 2015-03-24 (×7): qty 3

## 2015-03-24 MED ORDER — FUROSEMIDE 80 MG PO TABS
80.0000 mg | ORAL_TABLET | Freq: Two times a day (BID) | ORAL | Status: DC
  Administered 2015-03-24 – 2015-03-26 (×5): 80 mg via ORAL
  Filled 2015-03-24 (×7): qty 1

## 2015-03-24 MED ORDER — APIXABAN 2.5 MG PO TABS
2.5000 mg | ORAL_TABLET | Freq: Two times a day (BID) | ORAL | Status: DC
  Administered 2015-03-24 – 2015-03-30 (×13): 2.5 mg via ORAL
  Filled 2015-03-24 (×15): qty 1

## 2015-03-24 NOTE — Progress Notes (Signed)
Patient ID: Joanna [name], female   DOB: May 17, 1930, 81 y.o.   MRN: [id number]   SUBJECTIVE:   Admitted with volume overload, AKI, low output physiology with elevated lactate.  She was begun on milrinone gtt.  She had short runs atrial fibrillation on milrinone and has been on amiodarone as well, in NSR currently.   St Jude CRT-D placed 6/1.  Switched to po Lasix yesterday and milrinone to 0.125.  Co-ox today down to 55% from 64% with rise in creatinine to 1.7.  CVP 10.  Breathing is good at rest.  Cast taken off right leg yesterday.   Scheduled Meds: . amiodarone  200 mg Oral BID  . antiseptic oral rinse  7 mL Mouth Rinse BID  . aspirin EC  81 mg Oral Daily  . calcium-vitamin D  1 tablet Oral Q breakfast  . docusate sodium  100 mg Oral BID  . ferrous sulfate  325 mg Oral BID WC  . furosemide  80 mg Oral BID  . hydrALAZINE  10 mg Oral 3 times per day  . insulin aspart  0-9 Units Subcutaneous TID WC  . insulin glargine  10 Units Subcutaneous QHS  . isosorbide mononitrate  15 mg Oral Daily  . linagliptin  5 mg Oral Daily  . potassium chloride  40 mEq Oral BID  . sodium chloride  10-40 mL Intracatheter Q12H  . sodium chloride  10-40 mL Intracatheter Q12H   Continuous Infusions: . sodium chloride Stopped (03/19/15 0800)  . milrinone 0.125 mcg/kg/min (03/23/15 2346)   PRN Meds:.acetaminophen, [DISCONTINUED] ondansetron **OR** ondansetron (ZOFRAN) IV, ondansetron, oxyCODONE, sodium chloride, sodium chloride    Filed Vitals:   03/24/15 0600 03/24/15 0606 03/24/15 0700 03/24/15 0751  BP:  105/54  81/63  Pulse:    76  Temp:    98.3 F (36.8 C)  TempSrc:    Oral  Resp: 16 20 10 20   Height:      Weight:      SpO2:    92%    Intake/Output Summary (Last 24 hours) at 03/24/15 0829 Last data filed at 03/24/15 0700  Gross per 24 hour  Intake 1267.5 ml  Output   3226 ml  Net -1958.5 ml    LABS: Basic Metabolic Panel:  Recent Labs  46/56/81 0500 03/24/15 0410  NA 133* 134*    K 2.8* 3.2*  CL 85* 88*  CO2 35* 35*  GLUCOSE 153* 87  BUN 38* 38*  CREATININE 1.58* 1.74*  CALCIUM 9.1 9.3   Liver Function Tests: No results for input(s): AST, ALT, ALKPHOS, BILITOT, PROT, ALBUMIN in the last 72 hours. No results for input(s): LIPASE, AMYLASE in the last 72 hours. CBC:  Recent Labs  03/23/15 0500 03/24/15 0410  WBC 11.4* 11.6*  HGB 10.3* 10.8*  HCT 31.8* 33.5*  MCV 93.5 93.8  PLT 168 180   Cardiac Enzymes: No results for input(s): CKTOTAL, CKMB, CKMBINDEX, TROPONINI in the last 72 hours. BNP: Invalid input(s): POCBNP D-Dimer: No results for input(s): DDIMER in the last 72 hours. Hemoglobin A1C: No results for input(s): HGBA1C in the last 72 hours. Fasting Lipid Panel: No results for input(s): CHOL, HDL, LDLCALC, TRIG, CHOLHDL, LDLDIRECT in the last 72 hours. Thyroid Function Tests: No results for input(s): TSH, T4TOTAL, T3FREE, THYROIDAB in the last 72 hours.  Invalid input(s): FREET3 Anemia Panel: No results for input(s): VITAMINB12, FOLATE, FERRITIN, TIBC, IRON, RETICCTPCT in the last 72 hours.  RADIOLOGY: Dg Chest 2 View  03/16/2015  CLINICAL DATA:  Acute onset of chest pain and shortness of breath. Cough and congestion. Initial encounter.  EXAM: CHEST  2 VIEW  COMPARISON:  Chest radiograph performed 12/14/2014  FINDINGS: The lungs are well-aerated. A small left pleural effusion is noted. Chronically increased interstitial markings are noted. Mild peribronchial thickening is noted. No pneumothorax is identified. Scarring is seen at the left lung apex.  The heart is enlarged. The patient is status post median sternotomy. A valve replacement is noted. Orphaned pacemaker leads are visualized. No acute osseous abnormalities are seen.  IMPRESSION: Small left pleural effusion noted. Chronically increased interstitial markings seen. Cardiomegaly.   Electronically Signed   By: Roanna Raider M.D.   On: 03/16/2015 17:26   Dg Ankle Complete Right  03/23/2015    CLINICAL DATA:  Post right ankle surgery 6 weeks ago.  EXAM: RIGHT ANKLE - COMPLETE 3+ VIEW  COMPARISON:  01/30/2015  FINDINGS: Plate and screw fixation device noted across the distal fibular shaft fracture. Medial malleolar screws in place across the medial malleolar fractures. Anatomic alignment. No acute bony abnormality. Fracture lines are difficult to visualize. Fine bony detail is obscured by overlying cast material.  IMPRESSION: Evidence of healing across the distal fibular shaft fracture and medial malleolar fractures. Fine bony detail obscured by overlying cast.   Electronically Signed   By: Charlett Nose M.D.   On: 03/23/2015 15:41   Ct Head Wo Contrast  03/17/2015   CLINICAL DATA:  Acute encephalopathy.  No headache.  EXAM: CT HEAD WITHOUT CONTRAST  TECHNIQUE: Contiguous axial images were obtained from the base of the skull through the vertex without intravenous contrast.  COMPARISON:  12/15/2014  FINDINGS: The ventricles are normal configuration. There is ventricular and sulcal enlargement reflecting mild to moderate age related atrophy. No hydrocephalus.  There are no parenchymal masses or mass effect. There is no evidence of a cortical infarct. Mild periventricular white matter hypoattenuation is noted consistent with chronic microvascular ischemic change.  There are no extra-axial masses or abnormal fluid collections.  No intracranial hemorrhage.  Sinuses and mastoid air cells are clear.  IMPRESSION: 1. No acute intracranial abnormalities. 2. Mild to moderate atrophy. Mild chronic microvascular ischemic change.   Electronically Signed   By: Amie Portland M.D.   On: 03/17/2015 16:35   US Abdomen Complete  03/17/2015   CLINICAL DATA:  Nausea and vomiting  EXAM: ULTRASOUND ABDOMEN COMPLETE  COMPARISON:  None.  FINDINGS: Gallbladder: Gallbladder wall thickening measuring 6 mm and pericholecystic fluid noted. Adherent sludge balls or polyps, largest 3 mm. No sonographic Murphy sign.  Common bile  duct: Diameter: 7 mm  Liver: No focal lesion identified. Within normal limits in parenchymal echogenicity.  IVC: No abnormality visualized.  Pancreas: Visualized portion unremarkable.  Spleen: Size and appearance within normal limits.  Right Kidney: Length: 11.2 cm, increased renal cortical echogenicity. No hydronephrosis. Cortical cysts are noted, largest 1.5 x 1.4 x 1.4 cm.  Left Kidney: Length: 10.7 cm. Suboptimally visualized due to lack of optimal acoustic window with increased cortical echogenicity but no focal abnormality or mass. No hydronephrosis.  Abdominal aorta: No aneurysm visualized.  Other findings: Ascites and bilateral pleural effusions are noted. Incidental note is made of bladder distention, volume 1094 cc.  IMPRESSION: Increased renal cortical echogenicity suggesting medical renal disease, with right renal cysts.  Bladder distention.  Gallbladder wall thickening, pericholecystic fluid, and adherent sludge balls or polyps. If the patient has right upper quadrant abdominal pain, findings would be highly suspicious for  acute cholecystitis. However, there is ascites and gallbladder wall thickening may also be secondary to liver disease or hypoproteinemia.  Pleural effusions and ascites.   Electronically Signed   By: Christiana Pellant M.D.   On: 03/17/2015 09:30   Dg Chest Port 1 View  03/21/2015   CLINICAL DATA:  Pacemaker insertion today. Postprocedural radiograph.  EXAM: PORTABLE CHEST - 1 VIEW  COMPARISON:  03/20/2015.  FINDINGS: Support apparatus: Monitoring leads project over the chest. New pacemaker power pack with 3 LEFT subclavian leads. Leads appear in appropriate position with coronary sinus lead. Aortic valve replacements are present. Old epicardial pacing leads are present.  The cardiopericardial silhouette remains enlarged. There is bilateral interstitial and mild alveolar airspace disease compatible with pulmonary edema/volume overload. Aortic arch atherosclerosis.  IMPRESSION:  1.  Interval placement of 3 lead LEFT subclavian pacemaker and power pack. 2. Negative for pneumothorax or complicating features. 3. Mild interstitial and alveolar pulmonary edema.   Electronically Signed   By: Andreas Newport M.D.   On: 03/21/2015 17:02   Dg Chest Port 1 View  03/20/2015   CLINICAL DATA:  PICC line placement  EXAM: PORTABLE CHEST - 1 VIEW  COMPARISON:  03/17/2015  FINDINGS: Right PICC line is in place. The tip is in the SVC. There is cardiomegaly. Devitalized left pacer wires remain in place, unchanged. Prior valve replacement. Left lower lobe opacity with left effusion again noted, unchanged.  IMPRESSION: Right PICC line tip in the SVC.  No change in the left lower lobe opacity and left effusion.   Electronically Signed   By: Charlett Nose M.D.   On: 03/20/2015 11:14   Dg Chest Port 1 View  03/17/2015   CLINICAL DATA:  Central line placement.  EXAM: PORTABLE CHEST - 1 VIEW  COMPARISON:  03/16/2015  FINDINGS: Patient is moderately rotated to the left. Sternotomy wires and prosthetic heart valve unchanged. Evidence of a right IJ central venous catheter with tip likely over the SVC accounting for patient's rotation. No evidence of pneumothorax. There is continued left base opacification unchanged to slightly worse likely effusions/ atelectasis and less likely infection. Mild prominence of the perihilar markings suggesting mild vascular congestion. Remainder of the exam is unchanged.  IMPRESSION: Slight worsening left base opacification likely effusion with atelectasis although cannot exclude infection.  Suggestion mild vascular congestion.  Right IJ central venous catheter with tip likely over the region of the SVC. No pneumothorax.   Electronically Signed   By: Elberta Fortis M.D.   On: 03/17/2015 12:51    PHYSICAL EXAM General: NAD Neck: JVP 9-10 cm, no thyromegaly or thyroid nodule.  Lungs: Mild crackles at bases. CV: Nondisplaced PMI.  Heart regular S1/S2, 2/6 SEM RUSB.  Trace bilateral  ankle edema.   Abdomen: Soft, nontender, no hepatosplenomegaly, no distention.  Neurologic: Alert and oriented x 3.  Psych: Normal affect. Extremities: No clubbing or cyanosis. Erythema lower leg on right.   TELEMETRY: Reviewed telemetry pt in NSR   ASSESSMENT AND PLAN: 81 yo with history of bioprosthetic MVR/AVR, nonischemic cardiomyopathy (EF 20-25% with LBBB), and CKD was admitted with acute on chronic systolic CHF with low output/volume overloaded state.  AKI, lactate elevated.  Initially hyperkalemic with altered mental status.  1. Acute on chronic systolic CHF: Nonischemic cardiomyopathy with chronic LBBB.  EF 20-25%.  She has struggled recently with volume overload.  I recommended admission to her at last appointment, but she wanted to continue to try to manage at home.  St Jude CRT-D  placed 6/1.  CVP 10 this morning with co-ox fall from 64% => 55% with decrease in milrinone to 0.125. Creatinine mildly increased from 1.5 => 1.7.  She is now on po Lasix, weight down another 2 lbs for 14 lbs down total.   - Increase KCl to 60 bid.  - Lasix to 80 mg po bid.  - Keep milrinone at 0.125 mcg/kg/min for now, send co-ox again this morning.  May end up having to go home on milrinone but would like to avoid.  - Continue low dose hydralazine/nitrates, do not have BP room to increase at this time.  - Holding off on digoxin for now with low GFR.  2. AKI on CKD: Suspect cardiorenal syndrome, creatinine improved from admission but up a bit from yesterday.   3. ID: Treated for UTI with ceftriaxone (E coli).  4. Atrial fibrillation: Paroxysmal.  Has history of post-op afib. NSR with frequent PVCs and multiple short runs atrial fibrillation on milrinone, consistently in NSR on amiodarone.  Will use amiodarone for now to keep her in rhythm, decrease to 200 mg bid today.  Will transition off ASA 81 onto low dose Eliquis now that she is a few days out from [facility] and site is stable.  5. Bioprosthetic MV/AoV: Stable  on last echo.  6. Fractured left ankle: cast off yesterday, needs PT.  7. Plan for SNF at discharge.    [name] 03/24/2015 8:29 AM

## 2015-03-25 LAB — GLUCOSE, CAPILLARY
GLUCOSE-CAPILLARY: 157 mg/dL — AB (ref 65–99)
Glucose-Capillary: 116 mg/dL — ABNORMAL HIGH (ref 65–99)
Glucose-Capillary: 185 mg/dL — ABNORMAL HIGH (ref 65–99)

## 2015-03-25 LAB — BASIC METABOLIC PANEL
ANION GAP: 9 (ref 5–15)
BUN: 39 mg/dL — ABNORMAL HIGH (ref 6–20)
CO2: 38 mmol/L — ABNORMAL HIGH (ref 22–32)
Calcium: 9.2 mg/dL (ref 8.9–10.3)
Chloride: 88 mmol/L — ABNORMAL LOW (ref 101–111)
Creatinine, Ser: 1.79 mg/dL — ABNORMAL HIGH (ref 0.44–1.00)
GFR calc non Af Amer: 25 mL/min — ABNORMAL LOW (ref >60)
GFR, EST AFRICAN AMERICAN: 29 mL/min — AB (ref >60)
Glucose, Bld: 125 mg/dL — ABNORMAL HIGH (ref 65–99)
POTASSIUM: 3.3 mmol/L — AB (ref 3.5–5.1)
Sodium: 135 mmol/L (ref 135–145)

## 2015-03-25 LAB — CBC
HCT: 34.1 % — ABNORMAL LOW (ref 36.0–46.0)
Hemoglobin: 10.7 g/dL — ABNORMAL LOW (ref 12.0–15.0)
MCH: 29.5 pg (ref 26.0–34.0)
MCHC: 31.4 g/dL (ref 30.0–36.0)
MCV: 93.9 fL (ref 78.0–100.0)
Platelets: 184 10*3/uL (ref 150–400)
RBC: 3.63 MIL/uL — AB (ref 3.87–5.11)
RDW: 16 % — AB (ref 11.5–15.5)
WBC: 9.4 10*3/uL (ref 4.0–10.5)

## 2015-03-25 LAB — CARBOXYHEMOGLOBIN
CARBOXYHEMOGLOBIN: 2.2 % — AB (ref 0.5–1.5)
Methemoglobin: 1.1 % (ref 0.0–1.5)
O2 Saturation: 74.9 %
Total hemoglobin: 10.9 g/dL — ABNORMAL LOW (ref 12.0–16.0)

## 2015-03-25 MED ORDER — DOCUSATE SODIUM 100 MG PO CAPS
100.0000 mg | ORAL_CAPSULE | Freq: Every day | ORAL | Status: DC
  Administered 2015-03-27 – 2015-03-30 (×4): 100 mg via ORAL
  Filled 2015-03-25 (×6): qty 1

## 2015-03-25 NOTE — Progress Notes (Signed)
Patient ID: Joanna [name], female   DOB: [birth year], 81 y.o.   MRN: [id number]   SUBJECTIVE:   Admitted with volume overload, AKI, low output physiology with elevated lactate.  She was begun on milrinone gtt.  She had short runs atrial fibrillation on milrinone and has been on amiodarone as well, in NSR currently.   St Jude CRT-D placed 6/1.  Switched to po Lasix on 6/3/ Remains on milrinone to 0.125.  Co-ox 64% -> 54% -> 75%. Creatinine stable at 1.7  CVP 7-8 with prominent CV waves  Says she feels much better. Denies dyspnea. Weight down 17 pounds total.     Scheduled Meds: . amiodarone  200 mg Oral BID  . antiseptic oral rinse  7 mL Mouth Rinse BID  . apixaban  2.5 mg Oral BID  . calcium-vitamin D  1 tablet Oral Q breakfast  . docusate sodium  100 mg Oral Daily  . ferrous sulfate  325 mg Oral BID WC  . furosemide  80 mg Oral BID  . hydrALAZINE  10 mg Oral 3 times per day  . insulin aspart  0-9 Units Subcutaneous TID WC  . insulin glargine  10 Units Subcutaneous QHS  . isosorbide mononitrate  15 mg Oral Daily  . linagliptin  5 mg Oral Daily  . potassium chloride  60 mEq Oral BID  . sodium chloride  10-40 mL Intracatheter Q12H  . sodium chloride  10-40 mL Intracatheter Q12H   Continuous Infusions: . sodium chloride Stopped (03/19/15 0800)  . milrinone 0.125 mcg/kg/min (03/25/15 0700)   PRN Meds:.acetaminophen, [DISCONTINUED] ondansetron **OR** ondansetron (ZOFRAN) IV, ondansetron, oxyCODONE, sodium chloride, sodium chloride    Filed Vitals:   03/25/15 0808 03/25/15 1244 03/25/15 1246 03/25/15 1314  BP: 93/46 71/36 103/73 103/73  Pulse: 87 101    Temp: 97.9 F (36.6 C) 97.7 F (36.5 C)    TempSrc: Oral Oral    Resp: 14 27    Height:      Weight:      SpO2: 93% 94%      Intake/Output Summary (Last 24 hours) at 03/25/15 1402 Last data filed at 03/25/15 1247  Gross per 24 hour  Intake  792.5 ml  Output   1500 ml  Net -707.5 ml    LABS: Basic Metabolic  Panel:  Recent Labs  03/24/15 0410 03/25/15 0525  NA 134* 135  K 3.2* 3.3*  CL 88* 88*  CO2 35* 38*  GLUCOSE 87 125*  BUN 38* 39*  CREATININE 1.74* 1.79*  CALCIUM 9.3 9.2   Liver Function Tests: No results for input(s): AST, ALT, ALKPHOS, BILITOT, PROT, ALBUMIN in the last 72 hours. No results for input(s): LIPASE, AMYLASE in the last 72 hours. CBC:  Recent Labs  03/24/15 0410 03/25/15 0525  WBC 11.6* 9.4  HGB 10.8* 10.7*  HCT 33.5* 34.1*  MCV 93.8 93.9  PLT 180 184   Cardiac Enzymes: No results for input(s): CKTOTAL, CKMB, CKMBINDEX, TROPONINI in the last 72 hours. BNP: Invalid input(s): POCBNP D-Dimer: No results for input(s): DDIMER in the last 72 hours. Hemoglobin A1C: No results for input(s): HGBA1C in the last 72 hours. Fasting Lipid Panel: No results for input(s): CHOL, HDL, LDLCALC, TRIG, CHOLHDL, LDLDIRECT in the last 72 hours. Thyroid Function Tests: No results for input(s): TSH, T4TOTAL, T3FREE, THYROIDAB in the last 72 hours.  Invalid input(s): FREET3 Anemia Panel: No results for input(s): VITAMINB12, FOLATE, FERRITIN, TIBC, IRON, RETICCTPCT in the last 72 hours.  RADIOLOGY: Dg  Chest 2 View  03/16/2015   CLINICAL DATA:  Acute onset of chest pain and shortness of breath. Cough and congestion. Initial encounter.  EXAM: CHEST  2 VIEW  COMPARISON:  Chest radiograph performed 12/14/2014  FINDINGS: The lungs are well-aerated. A small left pleural effusion is noted. Chronically increased interstitial markings are noted. Mild peribronchial thickening is noted. No pneumothorax is identified. Scarring is seen at the left lung apex.  The heart is enlarged. The patient is status post median sternotomy. A valve replacement is noted. Orphaned pacemaker leads are visualized. No acute osseous abnormalities are seen.  IMPRESSION: Small left pleural effusion noted. Chronically increased interstitial markings seen. Cardiomegaly.   Electronically Signed   By: Roanna Raider  M.D.   On: 03/16/2015 17:26   Dg Ankle Complete Right  03/23/2015   CLINICAL DATA:  Post right ankle surgery 6 weeks ago.  EXAM: RIGHT ANKLE - COMPLETE 3+ VIEW  COMPARISON:  01/30/2015  FINDINGS: Plate and screw fixation device noted across the distal fibular shaft fracture. Medial malleolar screws in place across the medial malleolar fractures. Anatomic alignment. No acute bony abnormality. Fracture lines are difficult to visualize. Fine bony detail is obscured by overlying cast material.  IMPRESSION: Evidence of healing across the distal fibular shaft fracture and medial malleolar fractures. Fine bony detail obscured by overlying cast.   Electronically Signed   By: Charlett Nose M.D.   On: 03/23/2015 15:41   Ct Head Wo Contrast  03/17/2015   CLINICAL DATA:  Acute encephalopathy.  No headache.  EXAM: CT HEAD WITHOUT CONTRAST  TECHNIQUE: Contiguous axial images were obtained from the base of the skull through the vertex without intravenous contrast.  COMPARISON:  12/15/2014  FINDINGS: The ventricles are normal configuration. There is ventricular and sulcal enlargement reflecting mild to moderate age related atrophy. No hydrocephalus.  There are no parenchymal masses or mass effect. There is no evidence of a cortical infarct. Mild periventricular white matter hypoattenuation is noted consistent with chronic microvascular ischemic change.  There are no extra-axial masses or abnormal fluid collections.  No intracranial hemorrhage.  Sinuses and mastoid air cells are clear.  IMPRESSION: 1. No acute intracranial abnormalities. 2. Mild to moderate atrophy. Mild chronic microvascular ischemic change.   Electronically Signed   By: Amie Portland M.D.   On: 03/17/2015 16:35   US Abdomen Complete  03/17/2015   CLINICAL DATA:  Nausea and vomiting  EXAM: ULTRASOUND ABDOMEN COMPLETE  COMPARISON:  None.  FINDINGS: Gallbladder: Gallbladder wall thickening measuring 6 mm and pericholecystic fluid noted. Adherent sludge balls  or polyps, largest 3 mm. No sonographic Murphy sign.  Common bile duct: Diameter: 7 mm  Liver: No focal lesion identified. Within normal limits in parenchymal echogenicity.  IVC: No abnormality visualized.  Pancreas: Visualized portion unremarkable.  Spleen: Size and appearance within normal limits.  Right Kidney: Length: 11.2 cm, increased renal cortical echogenicity. No hydronephrosis. Cortical cysts are noted, largest 1.5 x 1.4 x 1.4 cm.  Left Kidney: Length: 10.7 cm. Suboptimally visualized due to lack of optimal acoustic window with increased cortical echogenicity but no focal abnormality or mass. No hydronephrosis.  Abdominal aorta: No aneurysm visualized.  Other findings: Ascites and bilateral pleural effusions are noted. Incidental note is made of bladder distention, volume 1094 cc.  IMPRESSION: Increased renal cortical echogenicity suggesting medical renal disease, with right renal cysts.  Bladder distention.  Gallbladder wall thickening, pericholecystic fluid, and adherent sludge balls or polyps. If the patient has right upper quadrant abdominal  pain, findings would be highly suspicious for acute cholecystitis. However, there is ascites and gallbladder wall thickening may also be secondary to liver disease or hypoproteinemia.  Pleural effusions and ascites.   Electronically Signed   By: Christiana Pellant M.D.   On: 03/17/2015 09:30   Dg Chest Port 1 View  03/21/2015   CLINICAL DATA:  Pacemaker insertion today. Postprocedural radiograph.  EXAM: PORTABLE CHEST - 1 VIEW  COMPARISON:  03/20/2015.  FINDINGS: Support apparatus: Monitoring leads project over the chest. New pacemaker power pack with 3 LEFT subclavian leads. Leads appear in appropriate position with coronary sinus lead. Aortic valve replacements are present. Old epicardial pacing leads are present.  The cardiopericardial silhouette remains enlarged. There is bilateral interstitial and mild alveolar airspace disease compatible with pulmonary  edema/volume overload. Aortic arch atherosclerosis.  IMPRESSION:  1. Interval placement of 3 lead LEFT subclavian pacemaker and power pack. 2. Negative for pneumothorax or complicating features. 3. Mild interstitial and alveolar pulmonary edema.   Electronically Signed   By: Andreas Newport M.D.   On: 03/21/2015 17:02   Dg Chest Port 1 View  03/20/2015   CLINICAL DATA:  PICC line placement  EXAM: PORTABLE CHEST - 1 VIEW  COMPARISON:  03/17/2015  FINDINGS: Right PICC line is in place. The tip is in the SVC. There is cardiomegaly. Devitalized left pacer wires remain in place, unchanged. Prior valve replacement. Left lower lobe opacity with left effusion again noted, unchanged.  IMPRESSION: Right PICC line tip in the SVC.  No change in the left lower lobe opacity and left effusion.   Electronically Signed   By: Charlett Nose M.D.   On: 03/20/2015 11:14   Dg Chest Port 1 View  03/17/2015   CLINICAL DATA:  Central line placement.  EXAM: PORTABLE CHEST - 1 VIEW  COMPARISON:  03/16/2015  FINDINGS: Patient is moderately rotated to the left. Sternotomy wires and prosthetic heart valve unchanged. Evidence of a right IJ central venous catheter with tip likely over the SVC accounting for patient's rotation. No evidence of pneumothorax. There is continued left base opacification unchanged to slightly worse likely effusions/ atelectasis and less likely infection. Mild prominence of the perihilar markings suggesting mild vascular congestion. Remainder of the exam is unchanged.  IMPRESSION: Slight worsening left base opacification likely effusion with atelectasis although cannot exclude infection.  Suggestion mild vascular congestion.  Right IJ central venous catheter with tip likely over the region of the SVC. No pneumothorax.   Electronically Signed   By: Elberta Fortis M.D.   On: 03/17/2015 12:51    PHYSICAL EXAM General: NAD Neck: JVP 7-8 cm, no thyromegaly or thyroid nodule.  Lungs: clear CV: Nondisplaced PMI.   Heart regular S1/S2, 2/6 SEM RUSB.  Trace bilateral ankle edema.   Abdomen: Soft, nontender, no hepatosplenomegaly, no distention.  Neurologic: Alert and oriented x 3.  Psych: Normal affect. Extremities: No clubbing or cyanosis. Erythema lower leg on right.   TELEMETRY: Reviewed telemetry pt in NSR with v-pacing  ASSESSMENT AND PLAN: 81 yo with history of bioprosthetic MVR/AVR, nonischemic cardiomyopathy (EF 20-25% with LBBB), and CKD was admitted with acute on chronic systolic CHF with low output/volume overloaded state.  AKI, lactate elevated.  Initially hyperkalemic with altered mental status.  1. Acute on chronic systolic CHF -> cardiogenic shock: Nonischemic cardiomyopathy with chronic LBBB.  EF 20-25%.  She has struggled recently with volume overload. St Jude CRT-D placed 6/1. Remains on milrinone 0.125.  2. AKI on CKD: Suspect cardiorenal  syndrome, creatinine now stable on milrinone 3. ID: Treated for UTI with ceftriaxone (E coli).  4. Atrial fibrillation: Paroxysmal.  Has history of post-op afib. NSR with frequent PVCs and multiple short runs atrial fibrillation on milrinone, consistently in NSR on amiodarone.  Now on Eliquis and amio 5. Bioprosthetic MV/AoV: Stable on last echo.  6. Fractured left ankle: cast off 6/3, needs PT.  7. Plan for SNF at discharge.    Overall much improved with milrinone and CRT. She has struggled with low output HF for some time and I worry she will need home inotropes but would like to avoid if at all possible particualrly with new device implant and risk of infection. Will continue milrinone again today. If co-ox stable in am will attempt to d/c. Supp K. May need to decrease lasix a bit. Maintaining NSR on amio. Continue Eliquis. Agree with PT/SNF.    Arvilla Meres MD 03/25/2015 2:02 PM

## 2015-03-26 LAB — CARBOXYHEMOGLOBIN
CARBOXYHEMOGLOBIN: 2 % — AB (ref 0.5–1.5)
Methemoglobin: 1 % (ref 0.0–1.5)
O2 SAT: 61.8 %
TOTAL HEMOGLOBIN: 10.7 g/dL — AB (ref 12.0–16.0)

## 2015-03-26 LAB — BASIC METABOLIC PANEL
Anion gap: 11 (ref 5–15)
BUN: 44 mg/dL — ABNORMAL HIGH (ref 6–20)
CHLORIDE: 92 mmol/L — AB (ref 101–111)
CO2: 33 mmol/L — ABNORMAL HIGH (ref 22–32)
CREATININE: 2 mg/dL — AB (ref 0.44–1.00)
Calcium: 9 mg/dL (ref 8.9–10.3)
GFR calc Af Amer: 26 mL/min — ABNORMAL LOW (ref >60)
GFR, EST NON AFRICAN AMERICAN: 22 mL/min — AB (ref >60)
GLUCOSE: 94 mg/dL (ref 65–99)
Potassium: 3.6 mmol/L (ref 3.5–5.1)
Sodium: 136 mmol/L (ref 135–145)

## 2015-03-26 LAB — GLUCOSE, CAPILLARY
Glucose-Capillary: 81 mg/dL (ref 65–99)
Glucose-Capillary: 160 mg/dL — ABNORMAL HIGH (ref 65–99)
Glucose-Capillary: 206 mg/dL — ABNORMAL HIGH (ref 65–99)
Glucose-Capillary: 153 mg/dL — ABNORMAL HIGH (ref 65–99)

## 2015-03-26 MED ORDER — FUROSEMIDE 10 MG/ML IJ SOLN
80.0000 mg | Freq: Once | INTRAMUSCULAR | Status: AC
  Administered 2015-03-26: 80 mg via INTRAVENOUS
  Filled 2015-03-26: qty 8

## 2015-03-26 NOTE — Progress Notes (Signed)
Advanced Heart Failure Rounding Note   Subjective:    Admitted with volume overload, AKI, low output physiology with elevated lactate. She was begun on milrinone gtt. She had short runs atrial fibrillation on milrinone and has been on amiodarone as well, in NSR currently.   St Jude CRT-D placed 6/1. Switched to po Lasix on 6/3/ Remains on milrinone to 0.125. Co-ox 64% -> 54% -> 75%. --> 61.8  Creatinine up slightly 1.79 > 2.0 today. CVP 7-8 with prominent CV waves  Weight down 15 lbs. (131 down from 146) Up two pounds since yesterday. Says she is feeling much better.  Denies SOB or dyspnea.   Objective:   Weight Range: 131 lb 3.2 oz (59.512 kg)  Vital Signs:   Temp:  [97.6 F (36.4 C)-98.5 F (36.9 C)] 97.6 F (36.4 C) (06/05 2300) Pulse Rate:  [87-101] 92 (06/05 1746) Resp:  [13-27] 13 (06/06 0000) BP: (71-155)/(36-128) 102/58 mmHg (06/06 0600) SpO2:  [93 %-95 %] 95 % (06/05 2300) Weight:  [131 lb 3.2 oz (59.512 kg)] 131 lb 3.2 oz (59.512 kg) (06/06 0600) Last BM Date: 03/25/15  Weight change: Filed Weights   03/24/15 0110 03/25/15 0500 03/26/15 0600  Weight: 132 lb 11.2 oz (60.192 kg) 129 lb 14.4 oz (58.922 kg) 131 lb 3.2 oz (59.512 kg)    Intake/Output:   Intake/Output Summary (Last 24 hours) at 03/26/15 0703 Last data filed at 03/26/15 0400  Gross per 24 hour  Intake    800 ml  Output   1400 ml  Net   -600 ml     Physical Exam: General: NAD Neck: JVP 7-8 cm, no thyromegaly or thyroid nodule.  Lungs: CTA bilaterally CV: Nondisplaced PMI. Heart regular S1/S2, 2/6 SEM RUSB. Trace bilateral ankle edema.  Abdomen: Soft, nontender, no hepatosplenomegaly, no distention.  Neurologic: Alert and oriented x 3.  Psych: Normal affect. Extremities: No clubbing or cyanosis. Wound care in place L LE., Mild erythema.  TELEMETRY: Reviewed telemetry pt in NSR with v-pacing. Occasional PVCs.  Labs: CBC  Recent Labs  03/24/15 0410 03/25/15 0525  WBC 11.6*  9.4  HGB 10.8* 10.7*  HCT 33.5* 34.1*  MCV 93.8 93.9  PLT 180 184   Basic Metabolic Panel  Recent Labs  03/25/15 0525 03/26/15 0558  NA 135 136  K 3.3* 3.6  CL 88* 92*  CO2 38* 33*  GLUCOSE 125* 94  BUN 39* 44*  CALCIUM 9.2 9.0   Liver Function Tests No results for input(s): AST, ALT, ALKPHOS, BILITOT, PROT, ALBUMIN in the last 72 hours. No results for input(s): LIPASE, AMYLASE in the last 72 hours. Cardiac Enzymes No results for input(s): CKTOTAL, CKMB, CKMBINDEX, TROPONINI in the last 72 hours.  BNP: BNP (last 3 results)  Recent Labs  02/26/15 1245 03/13/15 1028 03/16/15 1619  BNP 4185.3* 4332.8* >4500.0*    ProBNP (last 3 results) No results for input(s): PROBNP in the last 8760 hours.   D-Dimer No results for input(s): DDIMER in the last 72 hours. Hemoglobin A1C No results for input(s): HGBA1C in the last 72 hours. Fasting Lipid Panel No results for input(s): CHOL, HDL, LDLCALC, TRIG, CHOLHDL, LDLDIRECT in the last 72 hours. Thyroid Function Tests No results for input(s): TSH, T4TOTAL, T3FREE, THYROIDAB in the last 72 hours.  Invalid input(s): FREET3  Other results:     Imaging/Studies:   No results found.  Latest Echo 02/03/2015  LV EF: 20% -  25%  ------------------------------------------------------------------- Indications:   CHF - 428.0.  -------------------------------------------------------------------  History:  PMH:  Mitral valve disease. Aortic valve disease. Risk factors: Hypertension. Diabetes mellitus. Dyslipidemia.  ------------------------------------------------------------------- Study Conclusions  - Left ventricle: The cavity size was severely dilated. Wall thickness was increased in a pattern of mild LVH. Systolic function was severely reduced. The estimated ejection fraction was in the range of 20% to 25%. Diffuse hypokinesis. - Aortic valve: Bioprosthetic Aortic valve with no signs of AS  or signficant perivalvular regurgitation. - Mitral valve: Bioporsthetic Mitral Valve with mild central MR and thickened leaflets Valve area by continuity equation (using LVOT flow): 0.48 cm^2. - Left atrium: The atrium was moderately to severely dilated. - Right ventricle: The cavity size was mildly dilated. - Right atrium: The atrium was mildly dilated. - Tricuspid valve: There was moderate regurgitation.   Latest Cath   Medications:     Scheduled Medications: . amiodarone  200 mg Oral BID  . antiseptic oral rinse  7 mL Mouth Rinse BID  . apixaban  2.5 mg Oral BID  . calcium-vitamin D  1 tablet Oral Q breakfast  . docusate sodium  100 mg Oral Daily  . ferrous sulfate  325 mg Oral BID WC  . furosemide  80 mg Oral BID  . hydrALAZINE  10 mg Oral 3 times per day  . insulin aspart  0-9 Units Subcutaneous TID WC  . insulin glargine  10 Units Subcutaneous QHS  . isosorbide mononitrate  15 mg Oral Daily  . linagliptin  5 mg Oral Daily  . potassium chloride  60 mEq Oral BID  . sodium chloride  10-40 mL Intracatheter Q12H  . sodium chloride  10-40 mL Intracatheter Q12H     Infusions: . sodium chloride Stopped (03/19/15 0800)  . milrinone 0.125 mcg/kg/min (03/26/15 0000)     PRN Medications:  acetaminophen, [DISCONTINUED] ondansetron **OR** ondansetron (ZOFRAN) IV, ondansetron, oxyCODONE, sodium chloride, sodium chloride   Assessment/Plan   81 yo with history of bioprosthetic MVR/AVR, nonischemic cardiomyopathy (EF 20-25% with LBBB), and CKD was admitted with acute on chronic systolic CHF with low output/volume overloaded state. AKI, lactate elevated. Initially hyperkalemic with altered mental status.  1. Acute on chronic systolic CHF -> cardiogenic shock: Nonischemic cardiomyopathy with chronic LBBB. EF 20-25%. She has struggled recently with volume overload. St Jude CRT-D placed 6/1. Remains on milrinone 0.125.   2. AKI on CKD: Suspect cardiorenal syndrome, on  milrinone. Cr 1.74 > 1.79 > 2.0 Today. 3. ID: Treated for UTI with ceftriaxone (E coli).  4. Atrial fibrillation: Paroxysmal. Has history of post-op afib. NSR with frequent PVCs and multiple short runs atrial fibrillation on milrinone, consistently in NSR on amiodarone. Now on Eliquis and amio 5. Bioprosthetic MV/AoV: Stable on last echo.  6. Fractured left ankle: cast off 6/3, needs PT.  7. Plan for SNF at discharge.   Overall much improved with milrinone and CRT. She has struggled with low output HF for some time and worry she will need home inotropes but would like to avoid if at all possible particualrly with new device implant and risk of infection. Will continue milrinone today, as co-ox decreased since yesterday. K corrected. May need to decrease lasix 2/2 Cr bump. Maintaining NSR on amio. Continue Eliquis. Will discuss with MD   Dispo: PT/SNF when stable.    Length of Stay: 10   Graciella Freer PA-C 03/26/2015, 7:03 AM  Advanced Heart Failure Team Pager [phone] (M-F; 7a - 4p)  Please contact [facility] for night-coverage after hours (4p -7a ) and weekends on  [email or web address]   Patient seen and examined with Otilio Saber, PA-C. We discussed all aspects of the encounter. I agree with the assessment and plan as stated above.   Volume status up again. CVP 14. Co-ox down to 61%. Will stop milrinone and recheck co-ox in am. i suspect she will need home inotropes. Will give lasix 80 IV tonight.   Alicha Raspberry,MD 5:48 PM

## 2015-03-26 NOTE — Progress Notes (Signed)
Physical Therapy Treatment Patient Details Name: Joanna [name] MRN: [id number] DOB: 12-12-1929 Today's Date: 03/26/2015    History of Present Illness Joanna [name] is a 81 y.o. 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 This admission s/p cardiogenic shock with pacemaker replacement 6/1. Cast removed 6/3 with pt now WBAT in CAM boot    PT Comments    Pt with excellent mobility now that her cast is off and able to walk on her RLE. Pt with good strength given her prior TDWB status. Continues to have difficulty recalling her pacemaker placement limitations and needs verbal cues at all times to maintain. Will continue to follow to progress mobility and independence with education for continued mobility and HEP.   Follow Up Recommendations  SNF;Supervision/Assistance - 24 hour     Equipment Recommendations       Recommendations for Other Services       Precautions / Restrictions Precautions Precautions: ICD/Pacemaker;Fall Required Braces or Orthoses: Other Brace/Splint Other Brace/Splint: CAM boot RLE with all OOB activity Restrictions RLE Weight Bearing: Weight bearing as tolerated    Mobility  Bed Mobility               General bed mobility comments: pt in recliner on arrival  Transfers Overall transfer level: Needs assistance   Transfers: Sit to/from Stand Sit to Stand: Min assist         General transfer comment: cues for hand placement, safety and pacemaker precautions  Ambulation/Gait Ambulation/Gait assistance: Min guard Ambulation Distance (Feet): 110 Feet Assistive device: Rolling walker (2 wheeled) Gait Pattern/deviations: Step-through pattern;Decreased stride length   Gait velocity interpretation: Below normal speed for age/gender General Gait Details:  decreased stride with cues for posture and position in RW   Stairs            Wheelchair Mobility    Modified Rankin (Stroke Patients Only)       Balance Overall balance assessment: Needs assistance   Sitting balance-Leahy Scale: Good       Standing balance-Leahy Scale: Fair                      Cognition Arousal/Alertness: Awake/alert Behavior During Therapy: WFL for tasks assessed/performed         Memory: Decreased recall of precautions              Exercises General Exercises - Lower Extremity Long Arc Quad: AROM;Seated;Both;20 reps Hip ABduction/ADduction: AROM;Seated;Both;20 reps Hip Flexion/Marching: AROM;Seated;Both;20 reps    General Comments        Pertinent Vitals/Pain Pain Assessment: No/denies pain  HR 90-100 116/54    Home Living                      Prior Function            PT Goals (current goals can now be found in the care plan section) Progress towards PT goals: Goals met and updated - see care plan    Frequency       PT Plan Current plan remains appropriate    Co-evaluation             End of Session Equipment Utilized During Treatment: Gait belt Activity Tolerance: Patient tolerated treatment well Patient left: in chair;with call bell/phone within reach     Time:  0962-8366 PT Time Calculation (min) (ACUTE ONLY): 19 min  Charges:  $Gait Training: 8-22 mins                    G Codes:      Melford Aase 03-27-15, 12:46 PM Elwyn Reach, [place]

## 2015-03-26 NOTE — Progress Notes (Signed)
1350 Read PT's note and offered to walk with pt since she is WBAT. Pt declined at this time as she wants to rest. Stated would walk with [place] tomorrow. Will continue to follow. Luetta Nutting RN BSN 03/26/2015 1:50 PM

## 2015-03-26 NOTE — Care Management Note (Signed)
Case Management Note  Patient Details  Name: Joanna [name] MRN: [id number] Date of Birth: September 09, 1930  Subjective/Objective:                    Action/Plan:   Expected Discharge Date:                  Expected Discharge Plan:  Skilled Nursing Facility  In-House Referral:  Clinical Social Work  Discharge planning Services     Post Acute Care Choice:    Choice offered to:     DME Arranged:    DME Agency:     HH Arranged:    HH Agency:     Status of Service:     Medicare Important Message Given:  Yes Date Medicare IM Given:  03/26/15 Medicare IM give by:  debbie Gurtaj Ruz rn,bsn Date Additional Medicare IM Given:  03/22/15 Additional Medicare Important Message give by:  debbie Jaleeya Mcnelly rn,bsn  If discussed at Long Length of Stay Meetings, dates discussed:  03/27/15  Additional Comments:  Hanley Hays, RN 03/26/2015, 11:43 AM

## 2015-03-26 NOTE — Progress Notes (Signed)
CSW met with pt to confirm that return to [facility] is still the patient's preference- Pt still wishes to get rehab at [facility]- the facility is on board for pt to return to them when medically stable  CSW will continue to follow.  Domenica Reamer, [place] Social Worker [phone]

## 2015-03-27 LAB — BASIC METABOLIC PANEL
Anion gap: 9 (ref 5–15)
BUN: 51 mg/dL — AB (ref 6–20)
CALCIUM: 9.5 mg/dL (ref 8.9–10.3)
CO2: 32 mmol/L (ref 22–32)
Chloride: 94 mmol/L — ABNORMAL LOW (ref 101–111)
Creatinine, Ser: 2.27 mg/dL — ABNORMAL HIGH (ref 0.44–1.00)
GFR calc Af Amer: 22 mL/min — ABNORMAL LOW (ref >60)
GFR, EST NON AFRICAN AMERICAN: 19 mL/min — AB (ref >60)
GLUCOSE: 140 mg/dL — AB (ref 65–99)
POTASSIUM: 5.9 mmol/L — AB (ref 3.5–5.1)
SODIUM: 135 mmol/L (ref 135–145)

## 2015-03-27 LAB — GLUCOSE, CAPILLARY
GLUCOSE-CAPILLARY: 120 mg/dL — AB (ref 65–99)
GLUCOSE-CAPILLARY: 158 mg/dL — AB (ref 65–99)
Glucose-Capillary: 122 mg/dL — ABNORMAL HIGH (ref 65–99)
Glucose-Capillary: 172 mg/dL — ABNORMAL HIGH (ref 65–99)

## 2015-03-27 LAB — CARBOXYHEMOGLOBIN
Carboxyhemoglobin: 1.6 % — ABNORMAL HIGH (ref 0.5–1.5)
Methemoglobin: 1 % (ref 0.0–1.5)
O2 Saturation: 64.4 %
Total hemoglobin: 11.5 g/dL — ABNORMAL LOW (ref 12.0–16.0)

## 2015-03-27 LAB — CBC
HEMATOCRIT: 36 % (ref 36.0–46.0)
Hemoglobin: 11.3 g/dL — ABNORMAL LOW (ref 12.0–15.0)
MCH: 29.7 pg (ref 26.0–34.0)
MCHC: 31.4 g/dL (ref 30.0–36.0)
MCV: 94.5 fL (ref 78.0–100.0)
PLATELETS: 236 10*3/uL (ref 150–400)
RBC: 3.81 MIL/uL — ABNORMAL LOW (ref 3.87–5.11)
RDW: 15.9 % — ABNORMAL HIGH (ref 11.5–15.5)
WBC: 10.5 10*3/uL (ref 4.0–10.5)

## 2015-03-27 LAB — AMMONIA: Ammonia: 18 umol/L (ref 9–35)

## 2015-03-27 MED ORDER — MILRINONE IN DEXTROSE 20 MG/100ML IV SOLN
0.2500 ug/kg/min | INTRAVENOUS | Status: DC
Start: 2015-03-27 — End: 2015-03-30
  Administered 2015-03-27 – 2015-03-29 (×4): 0.25 ug/kg/min via INTRAVENOUS
  Filled 2015-03-27 (×4): qty 100

## 2015-03-27 MED ORDER — FUROSEMIDE 10 MG/ML IJ SOLN
80.0000 mg | Freq: Once | INTRAMUSCULAR | Status: AC
  Administered 2015-03-27: 80 mg via INTRAVENOUS
  Filled 2015-03-27: qty 8

## 2015-03-27 NOTE — Progress Notes (Signed)
CARDIAC REHAB PHASE I   PRE:  Rate/Rhythm: paced 81  BP:  Supine: 99/48  Sitting:   Standing:    SaO2: 100% 2L  MODE:  Ambulation: 160 ft   POST:  Rate/Rhythm: 92 paced  BP:  Supine: 107/55  Sitting:   Standing:    SaO2: 100%RA 1450-1520 Put boot on right leg for ambulation. Pt stated felt good to be up. Walked 160 ft on RA with gait belt use and rolling walker. Pt did have tendency to get close to hall on right side. Tried to get to stay in middle of hall. Pt kept left arm down and did not put pressure on arm. To bed after walk. Tolerated well. Took boot off for pt to rest in bed. Tired by end of walk.   Luetta Nutting, RN BSN  03/27/2015 3:17 PM

## 2015-03-27 NOTE — Progress Notes (Signed)
Advanced Heart Failure Rounding Note   Subjective:    Admitted with volume overload, AKI, low output physiology with elevated lactate. She was begun on milrinone gtt. She had short runs atrial fibrillation on milrinone and has been on amiodarone as well, in NSR currently.   St Jude CRT-D placed 6/1. Switched to po Lasix on 6/3 Milrinone was d/c'd last night to assess co-ox off of it. She slowed slight improvement. Co-ox 64% -> 54% -> 75%. --> 61.8 % -->  64.4%  Creatinine up slightly again 1.79 > 2.0 > 2.27 today after 80 mg of IV lasix last night. JVD 7-8 cm with prominent CV waves  Weight has been as low as 131 lbs (from 146 lbs) but up 5 pounds since yesterday, with standing scale used. May have read falsely low yesterday.   Says she is feeling worse again with mild SOB lying in bed and DOE when going to the restroom.     Objective:   Weight Range: 136 lb 7.4 oz (61.9 kg)  Vital Signs:   Temp:  [97.3 F (36.3 C)-98.6 F (37 C)] 97.3 F (36.3 C) (06/07 0359) Pulse Rate:  [66-122] 66 (06/07 0443) Resp:  [16-28] 26 (06/07 0443) BP: (89-116)/(52-72) 104/69 mmHg (06/07 0644) SpO2:  [18 %-100 %] 99 % (06/07 0443) Weight:  [136 lb 7.4 oz (61.9 kg)] 136 lb 7.4 oz (61.9 kg) (06/07 0443) Last BM Date: 03/25/15  Weight change: Filed Weights   03/25/15 0500 03/26/15 0600 03/27/15 0443  Weight: 129 lb 14.4 oz (58.922 kg) 131 lb 3.2 oz (59.512 kg) 136 lb 7.4 oz (61.9 kg)    Intake/Output:   Intake/Output Summary (Last 24 hours) at 03/27/15 0704 Last data filed at 03/27/15 0400  Gross per 24 hour  Intake  14.79 ml  Output    650 ml  Net -635.21 ml     Physical Exam: General: NAD Neck: JVP 7-8 cm, no thyromegaly or thyroid nodule.  Lungs: bibasilar crackles, diminished L base. CV: Nondisplaced PMI. Heart regular S1/S2, 2/6 SEM RUSB. Trace bilateral ankle edema.  Abdomen: Soft, nontender, no hepatosplenomegaly, no distention.  Neurologic: Alert and oriented x 3.   Psych: Normal affect. Extremities: No clubbing or cyanosis. Wound care in place L LE., Mild erythema.  TELEMETRY: Reviewed telemetry pt in NSR with v-pacing. Occasional PVCs.  Labs: CBC  Recent Labs  03/25/15 0525 03/27/15 0433  WBC 9.4 10.5  HGB 10.7* 11.3*  HCT 34.1* 36.0  MCV 93.9 94.5  PLT 184 236   Basic Metabolic Panel  Recent Labs  03/26/15 0558 03/27/15 0433  NA 136 135  K 3.6 5.9*  CL 92* 94*  CO2 33* 32  GLUCOSE 94 140*  BUN 44* 51*  CALCIUM 9.0 9.5   Liver Function Tests No results for input(s): AST, ALT, ALKPHOS, BILITOT, PROT, ALBUMIN in the last 72 hours. No results for input(s): LIPASE, AMYLASE in the last 72 hours. Cardiac Enzymes No results for input(s): CKTOTAL, CKMB, CKMBINDEX, TROPONINI in the last 72 hours.  BNP: BNP (last 3 results)  Recent Labs  02/26/15 1245 03/13/15 1028 03/16/15 1619  BNP 4185.3* 4332.8* >4500.0*    ProBNP (last 3 results) No results for input(s): PROBNP in the last 8760 hours.   D-Dimer No results for input(s): DDIMER in the last 72 hours. Hemoglobin A1C No results for input(s): HGBA1C in the last 72 hours. Fasting Lipid Panel No results for input(s): CHOL, HDL, LDLCALC, TRIG, CHOLHDL, LDLDIRECT in the last 72 hours. Thyroid Function  Tests No results for input(s): TSH, T4TOTAL, T3FREE, THYROIDAB in the last 72 hours.  Invalid input(s): FREET3  Other results:     Imaging/Studies:  No results found.  Latest Echo 02/03/2015  LV EF: 20% -  25%  ------------------------------------------------------------------- Indications:   CHF - 428.0.  ------------------------------------------------------------------- History:  PMH:  Mitral valve disease. Aortic valve disease. Risk factors: Hypertension. Diabetes mellitus. Dyslipidemia.  ------------------------------------------------------------------- Study Conclusions  - Left ventricle: The cavity size was severely dilated.  Wall thickness was increased in a pattern of mild LVH. Systolic function was severely reduced. The estimated ejection fraction was in the range of 20% to 25%. Diffuse hypokinesis. - Aortic valve: Bioprosthetic Aortic valve with no signs of AS or signficant perivalvular regurgitation. - Mitral valve: Bioporsthetic Mitral Valve with mild central MR and thickened leaflets Valve area by continuity equation (using LVOT flow): 0.48 cm^2. - Left atrium: The atrium was moderately to severely dilated. - Right ventricle: The cavity size was mildly dilated. - Right atrium: The atrium was mildly dilated. - Tricuspid valve: There was moderate regurgitation.   Latest Cath   Medications:     Scheduled Medications: . amiodarone  200 mg Oral BID  . antiseptic oral rinse  7 mL Mouth Rinse BID  . apixaban  2.5 mg Oral BID  . calcium-vitamin D  1 tablet Oral Q breakfast  . docusate sodium  100 mg Oral Daily  . ferrous sulfate  325 mg Oral BID WC  . hydrALAZINE  10 mg Oral 3 times per day  . insulin aspart  0-9 Units Subcutaneous TID WC  . insulin glargine  10 Units Subcutaneous QHS  . isosorbide mononitrate  15 mg Oral Daily  . linagliptin  5 mg Oral Daily  . potassium chloride  60 mEq Oral BID  . sodium chloride  10-40 mL Intracatheter Q12H  . sodium chloride  10-40 mL Intracatheter Q12H    Infusions: . sodium chloride Stopped (03/19/15 0800)    PRN Medications: acetaminophen, [DISCONTINUED] ondansetron **OR** ondansetron (ZOFRAN) IV, ondansetron, oxyCODONE, sodium chloride, sodium chloride   Assessment/Plan   81 yo with history of bioprosthetic MVR/AVR, nonischemic cardiomyopathy (EF 20-25% with LBBB), and CKD was admitted with acute on chronic systolic CHF with low output/volume overloaded state. AKI, lactate elevated. Initially hyperkalemic with altered mental status.  1. Acute on chronic systolic CHF -> cardiogenic shock: Nonischemic cardiomyopathy with chronic LBBB.  EF 20-25%. She has struggled recently with volume overload. St Jude CRT-D placed 6/1. Held milrinone 0.125 for co-ox trial this morning.  Showed slight improvement but will likely still need milrinone at home.  2. AKI on CKD: Suspect cardiorenal syndrome. Cr 1.79 > 2.0 > 2.27 Today. 3. ID: Treated for UTI with ceftriaxone (E coli).  4. Atrial fibrillation: Paroxysmal. Has history of post-op afib. NSR with frequent PVCs and multiple short runs atrial fibrillation on milrinone, consistently in NSR on amiodarone. Now on Eliquis and amio 5. Bioprosthetic MV/AoV: Stable on last echo.  6. Fractured left ankle: cast off 6/3, needs PT.  7. Plan for SNF at discharge.   Overall has shown improvement with milrinone and CRT. She has struggled with low output HF for some time and the worry is she will need home inotropes but would like to avoid if at all possible particularly with new device implant and risk of infection. Showed slight improvement of co-ox even when holding milrinone overnight, although is now symptomatic again. Will discuss resumption of milrinone with MD. K over-corrected, will need to hold  K, Nurse aware. Will reassess in the morning. May need to decrease lasix 2/2 Cr bump. Maintaining NSR on amio. Continue Eliquis. Will discuss with MD   Dispo: Likely to inotrope capable SNF when stable.    Length of Stay: [address]   Graciella Freer PA-C 03/27/2015, 7:04 AM  Advanced Heart Failure Team Pager [phone] (M-F; 7a - 4p)  Please contact [facility] for night-coverage after hours (4p -7a ) and weekends on [email or web address]  Patient seen and examined with Otilio Saber, PA-C. We discussed all aspects of the encounter. I agree with the assessment and plan as stated above.   Despite stable co-ox, she is clinically much worse today. More dyspneic and lethargic. Volume status up. Renal function worse. Will restart milrinone. Check ammonia level. If not waking up well may need ABG. Treating  UTI.  Bensimhon, Daniel,MD 2:25 PM

## 2015-03-27 NOTE — Progress Notes (Signed)
CSW spoke with patient about going to [facility] while on milrinone drip- pt is agreeable.  CSW informed [facility] they are agreeable to taking patient and then helping with transition back to [facility] when/if milrinone is DC'd.  CSW will continue to follow.   Merlyn Lot, LCSWA Clinical Social Worker [phone]

## 2015-03-27 NOTE — Progress Notes (Signed)
Spoke w pa andy who states pt will be disch w iv milrinone, alerted sw jenna who spoke w [facility] and they have talked w [facility] about taking pt while on milrinone. [facility] does not do milrinone drips. Alerted pam w [facility] that if pt goes to [facility] will need iv milrinone as [facility] does milrinone for this facility. Will await sw and pt final  Choices.

## 2015-03-27 NOTE — Addendum Note (Signed)
Addendum  created 03/27/15 1031 by Kipp Brood, MD   Modules edited: Anesthesia Blocks and Procedures, Clinical Notes   Clinical Notes:  File: [id number]

## 2015-03-28 DIAGNOSIS — Z515 Encounter for palliative care: Secondary | ICD-10-CM

## 2015-03-28 DIAGNOSIS — R0689 Other abnormalities of breathing: Secondary | ICD-10-CM

## 2015-03-28 DIAGNOSIS — R0600 Dyspnea, unspecified: Secondary | ICD-10-CM

## 2015-03-28 LAB — GLUCOSE, CAPILLARY
GLUCOSE-CAPILLARY: 147 mg/dL — AB (ref 65–99)
Glucose-Capillary: 83 mg/dL (ref 65–99)
Glucose-Capillary: 122 mg/dL — ABNORMAL HIGH (ref 65–99)
Glucose-Capillary: 185 mg/dL — ABNORMAL HIGH (ref 65–99)

## 2015-03-28 LAB — CARBOXYHEMOGLOBIN
CARBOXYHEMOGLOBIN: 1.8 % — AB (ref 0.5–1.5)
CARBOXYHEMOGLOBIN: 1.2 % (ref 0.5–1.5)
METHEMOGLOBIN: 1.1 % (ref 0.0–1.5)
Methemoglobin: 0.8 % (ref 0.0–1.5)
O2 SAT: 42.6 %
O2 Saturation: 49.2 %
Total hemoglobin: 9.5 g/dL — ABNORMAL LOW (ref 12.0–16.0)
Total hemoglobin: 9.9 g/dL — ABNORMAL LOW (ref 12.0–16.0)

## 2015-03-28 LAB — BASIC METABOLIC PANEL
Anion gap: 8 (ref 5–15)
BUN: 56 mg/dL — ABNORMAL HIGH (ref 6–20)
CO2: 31 mmol/L (ref 22–32)
CREATININE: 2.24 mg/dL — AB (ref 0.44–1.00)
Calcium: 8.9 mg/dL (ref 8.9–10.3)
Chloride: 94 mmol/L — ABNORMAL LOW (ref 101–111)
GFR calc Af Amer: 22 mL/min — ABNORMAL LOW (ref >60)
GFR calc non Af Amer: 19 mL/min — ABNORMAL LOW (ref >60)
GLUCOSE: 87 mg/dL (ref 65–99)
POTASSIUM: 4.4 mmol/L (ref 3.5–5.1)
Sodium: 133 mmol/L — ABNORMAL LOW (ref 135–145)

## 2015-03-28 MED ORDER — FUROSEMIDE 10 MG/ML IJ SOLN
80.0000 mg | Freq: Once | INTRAMUSCULAR | Status: AC
  Administered 2015-03-28: 80 mg via INTRAVENOUS
  Filled 2015-03-28: qty 8

## 2015-03-28 MED ORDER — POTASSIUM CHLORIDE CRYS ER 20 MEQ PO TBCR
40.0000 meq | EXTENDED_RELEASE_TABLET | Freq: Every day | ORAL | Status: DC
  Administered 2015-03-28 – 2015-03-29 (×2): 40 meq via ORAL
  Filled 2015-03-28 (×3): qty 2

## 2015-03-28 MED ORDER — FUROSEMIDE 80 MG PO TABS
80.0000 mg | ORAL_TABLET | Freq: Two times a day (BID) | ORAL | Status: DC
  Filled 2015-03-28 (×3): qty 1

## 2015-03-28 NOTE — Consult Note (Signed)
Consultation Note Date: 03/28/2015   Patient Name: Joanna [name]  DOB: 20-Dec-1929  MRN: [id number]  Age / Sex: 81 y.o., female   PCP: Joanna Lima, MD Referring Physician: Dorothy Spark, MD  Reason for Consultation: Establishing goals of care  Palliative Care Assessment and Plan Summary of Established Goals of Care and Medical Treatment Preferences   Clinical Assessment/Narrative: 81 yo female with biprosthetic MVR/RVR, CKD, NICM w/LBBB s/p CRT. Admitted 5/27 with CHF exacerbation. Now on milrinone drip.    Contacts/Participants in Discussion: Primary Decision Maker: Patient   HCPOA: no   Met with Joanna [name] today. In assessing her understanding of current situation and issues she faces, she seems to have poor to fair understanding at best.  She knows about her prosthetic valves and that she has "issues" with her heart, but did not know she was on milrinone drip or why CRT was pursued.  She was living with her son previously and had been at [facility] as well. She understands that currently plan is to go to SNF after hospital.  She feels like things are going "well" with her heart and thinks there is not much to be concerned about at this point. I spoke with her daughter over phone today who expressed better understanding and more appropriate concern.  She fears that options are coming to an end to treat her heart disease.  We reviewed events here and some challenges potentially down the road.  We will meet again at 8AM to try and have Joanna [name] more involved and see if we can improve her understanding. She will also see if her brother Joanna [name] can attend this meeting.   Code Status/Advance Care Planning:  Full   Symptom Management:   Dyspnea- milrinone, PRN oxycodone (not needing)  Psycho-social/Spiritual:   Support System: son, daughter. Widowed  Prognosis: likely less than 6 months  Discharge Planning:  [place] for rehab with Palliative care service follow-up        Chief Complaint: Nausea/Vomiting  History of Present Illness:  81 yo female with PMHx of Biprosthetic MVR/RVR, NICM with LBBB, CKD admitted with acute on chronic systolic CHF, AMS, AKI, HyperK c/w cardiogenic shock.  She was admitted on 5/27 and had prolonged hospital stay.  She had Biventicular pacer placed with ICD due to low EF, and LBBB.  Unfortunately, did not have much clinical improvement in her CHF and now is continued on milrinone infusion.  She failed trial of weanig milrinone. Plan is for dc to SNF in coming days with milrinone drip as outpatient. Joanna [name] feels like she is doing well. No longer with N/V, SOB pain.  Ambulating with PT. Recent R ankle fracture currently in walking boot.  Feels like appetite is good. Denies diarrhea/constipation.    Primary Diagnoses  Present on Admission:  . Hyperkalemia . Nausea & vomiting . Renal failure (ARF), acute on chronic . Chronic systolic heart failure . PAF (paroxysmal atrial fibrillation) . Acute systolic congestive heart failure, NYHA class 4   I have reviewed the medical record, interviewed the patient and family, and examined the patient. The following aspects are pertinent.  Past Medical History  Diagnosis Date  . Peripheral artery disease   . Hyperlipidemia        . Arthritis   . Aortic stenosis      a. s/p AVR  . BUNDLE BRANCH BLOCK, LEFT    . CAROTID ARTERY STENOSIS 01/05/2009  . CVA 03/14/2010  . PERIPHERAL VASCULAR DISEASE 07/30/2007  . Chronic  combined systolic and diastolic CHF (congestive heart failure)     a. [place] 09/2011 RA 8, RV 58/2/11; PA 54/22 (37) PCWP 20; F CO/CI 4.57/2.67 PVR 3.7; b. L main no sig dz, LAD luminal irreg; LCx lg Ramus with luminal irreg, small AV Lcx witout sig dz, RCA luminal irreg; severe mitral annular calcifications; AV calcified c. EF 45-50% (07/2013);  d. 11/2014 Echo: EF 20-25%, diff HK, nl AV/MV, sev dil LA, mod TR/PR, PASP 38mHg.  . Diabetes mellitus   . HTN (hypertension)   . Iron  deficiency anemia 09/21/2011  . History of shingles   . Mitral regurgitation     a. s/p MVR  . S/P aortic valve replacement with bioprosthetic valve 06/15/2013    [facility]  . S/P mitral valve replacement with bioprosthetic valve 06/15/2013    [facility]  . Fracture, fibula, shaft 01/30/2015    right   History   Social History  . Marital Status: Divorced    Spouse Name: N/A  . Number of Children: N/A  . Years of Education: N/A   Social History Main Topics  . Smoking status: Never Smoker   . Smokeless tobacco: Never Used  . Alcohol Use: No  . Drug Use: No  . Sexual Activity: No   Other Topics Concern  . None   Social History Narrative   Patient lives alone here in town   She continues to work, helping to bind and oversew books   She is divorced after 266years of marriage   1 son- '61, minister; 1 dtr '55; 4 grandchildren   End of life care-provided packet on living well   Family History  Problem Relation Age of Onset  . Colon cancer Mother   . Diabetes Neg Hx   . Coronary artery disease Neg Hx    Scheduled Meds: . amiodarone  200 mg Oral BID  . antiseptic oral rinse  7 mL Mouth Rinse BID  . apixaban  2.5 mg Oral BID  . calcium-vitamin D  1 tablet Oral Q breakfast  . docusate sodium  100 mg Oral Daily  . ferrous sulfate  325 mg Oral BID WC  . hydrALAZINE  10 mg Oral 3 times per day  . insulin aspart  0-9 Units Subcutaneous TID WC  . insulin glargine  10 Units Subcutaneous QHS  . isosorbide mononitrate  15 mg Oral Daily  . linagliptin  5 mg Oral Daily  . potassium chloride  40 mEq Oral Daily  . sodium chloride  10-40 mL Intracatheter Q12H  . sodium chloride  10-40 mL Intracatheter Q12H   Continuous Infusions: . sodium chloride Stopped (03/19/15 0800)  . milrinone 0.25 mcg/kg/min (03/28/15 1000)   PRN Meds:.acetaminophen, [DISCONTINUED] ondansetron **OR** ondansetron  (ZOFRAN) IV, ondansetron, oxyCODONE, sodium chloride, sodium chloride Medications Prior to Admission:  Prior to Admission medications   Medication Sig Start Date End Date Taking? Authorizing Provider  acetaminophen (TYLENOL) 325 MG tablet Take 325 mg by mouth every 6 (six) hours as needed for mild pain.   Yes Historical Provider, MD  aspirin EC 81 MG tablet Take 1 tablet (81 mg total) by mouth daily. 07/10/14  Yes DJolaine Artist MD  calcium citrate-vitamin D (CITRACAL+D) 315-200 MG-UNIT per tablet Take 1 tablet by mouth daily.   Yes Historical Provider, MD  docusate sodium (COLACE) 100 MG capsule Take 100 mg by mouth 2 (two) times daily.  Yes Historical Provider, MD  ferrous sulfate 325 (65 FE) MG tablet Take 1 tablet (325 mg total) by mouth 2 (two) times daily with a meal. 02/28/14  Yes Joanna Lima, MD  furosemide (LASIX) 80 MG tablet Take 80 mg by mouth 2 (two) times daily.   Yes Historical Provider, MD  HYDROcodone-acetaminophen (NORCO/VICODIN) 5-325 MG per tablet Take 1-2 tablets by mouth every 4 (four) hours as needed (breakthrough pain). 02/02/15  Yes Donne Hazel, MD  insulin aspart (NOVOLOG) 100 UNIT/ML FlexPen Inject 3 Units into the skin 3 (three) times daily with meals. 02/02/15  Yes Donne Hazel, MD  Insulin Glargine (TOUJEO SOLOSTAR) 300 UNIT/ML SOPN Inject 20 Units into the skin daily. 02/02/15  Yes Donne Hazel, MD  JANUVIA 50 MG tablet Take 50 mg by mouth daily. 10/28/14  Yes Historical Provider, MD  metolazone (ZAROXOLYN) 5 MG tablet Take 1 tablet (5 mg total) by mouth 3 (three) times a week. Monday Wednesday and Friday 03/13/15  Yes Larey Dresser, MD  metolazone (ZAROXOLYN) 5 MG tablet Take 5 mg by mouth See admin instructions. Take on Monday Wednesday and Friday (Give Prior to lasix)   Yes Historical Provider, MD  ondansetron (ZOFRAN) 4 MG tablet Take 4 mg by mouth every 4 (four) hours as needed for nausea or vomiting.   Yes Historical Provider, MD  potassium chloride  SA (K-DUR,KLOR-CON) 20 MEQ tablet Take 40 mEq by mouth See admin instructions. Take on Monday Wednesday and Fridays when metolazone is administered.   Yes Historical Provider, MD  potassium chloride SA (K-DUR,KLOR-CON) 20 MEQ tablet Take 60 mEq by mouth 2 (two) times daily.   Yes Historical Provider, MD  promethazine (PHENERGAN) 25 MG tablet Take 25 mg by mouth every 12 (twelve) hours as needed for nausea or vomiting.   Yes Historical Provider, MD  simvastatin (ZOCOR) 20 MG tablet Take 1 tablet (20 mg total) by mouth every evening. 02/28/14  Yes Joanna Lima, MD  furosemide (LASIX) 40 MG tablet Take 2 tablets (80 mg total) by mouth 2 (two) times daily. Patient not taking: Reported on 03/16/2015 03/13/15   Larey Dresser, MD  Insulin Pen Needle (NOVOFINE) 32G X 6 MM MISC Use daily with insulin 12/29/14   Joanna Lima, MD  potassium chloride SA (K-DUR,KLOR-CON) 20 MEQ tablet Take 3 tablets (60 mEq total) by mouth 2 (two) times daily. Take 3 extra tabs (60 MEQ) on Mon Wed and Fri with Metolazone Patient not taking: Reported on 03/16/2015 03/15/15   Larey Dresser, MD   Allergies  Allergen Reactions  . Other Hives    STEROIDS  . Prednisone Hives and Other (See Comments)    She is allergic to all steroids!   CBC:    Component Value Date/Time   WBC 10.5 03/27/2015 0433   HGB 11.3* 03/27/2015 0433   HCT 36.0 03/27/2015 0433   PLT 236 03/27/2015 0433   MCV 94.5 03/27/2015 0433   NEUTROABS 9.9* 03/17/2015 0147   LYMPHSABS 1.5 03/17/2015 0147   MONOABS 0.8 03/17/2015 0147   EOSABS 0.0 03/17/2015 0147   BASOSABS 0.0 03/17/2015 0147   Comprehensive Metabolic Panel:    Component Value Date/Time   NA 133* 03/28/2015 0440   K 4.4 03/28/2015 0440   CL 94* 03/28/2015 0440   CO2 31 03/28/2015 0440   BUN 56* 03/28/2015 0440   CREATININE 2.24* 03/28/2015 0440   GLUCOSE 87 03/28/2015 0440   CALCIUM 8.9 03/28/2015 0440   AST  311* 03/17/2015 0147   ALT 183* 03/17/2015 0147   ALKPHOS 148*  03/17/2015 0147   BILITOT 2.4* 03/17/2015 0147   PROT 5.6* 03/17/2015 0147   ALBUMIN 3.2* 03/17/2015 0147    Physical Exam: Vital Signs: BP 101/55 mmHg  Pulse 80  Temp(Src) 98.3 F (36.8 C) (Oral)  Resp 20  Ht 5' 4"  (1.626 m)  Wt 61.054 kg (134 lb 9.6 oz)  BMI 23.09 kg/m2  SpO2 98% SpO2: SpO2: 98 % O2 Device: O2 Device: Nasal Cannula O2 Flow Rate: O2 Flow Rate (L/min): 2 L/min Intake/output summary:  Intake/Output Summary (Last 24 hours) at 03/28/15 1439 Last data filed at 03/28/15 1121  Gross per 24 hour  Intake    652 ml  Output   1100 ml  Net   -448 ml   LBM: Last BM Date: 03/27/15 Baseline Weight: Weight: 56.246 kg (124 lb) Most recent weight: Weight: 61.054 kg (134 lb 9.6 oz)  Exam Findings:  GEN: alert, NAD HEENT: [place], sclera anicteric CV: reg rate EXT: r walking boot. No peripheral edema         Palliative Performance Scale:50             Additional Data Reviewed: Recent Labs     03/27/15  0433  03/28/15  0440  WBC  10.5   --   HGB  11.3*   --   PLT  236   --   NA  135  133*  BUN  51*  56*  CREATININE  2.27*  2.24*     Time Total: 60 minutes Greater than 50%  of this time was spent counseling and coordinating care related to the above assessment and plan.  Signed by: Isaac Laud, DO  Doran Clay, DO  03/28/2015, 2:39 PM  Please contact Palliative Medicine Team phone at [phone] for questions and concerns.

## 2015-03-28 NOTE — Progress Notes (Signed)
Advanced Heart Failure Rounding Note   Subjective:    Admitted with volume overload, AKI, low output physiology with elevated lactate. She was begun on milrinone gtt. She had short runs atrial fibrillation on milrinone and has been on amiodarone as well, in NSR currently.   St Jude CRT-D placed 6/1. Switched to po Lasix on 6/3 Milrinone was d/c'd last night to assess co-ox off of it. She slowed slight improvement. Co-ox 64% -> 54% -> 75%. --> 61.8 % -->  64.4% --> 49.2 %  Creatinine stable 2.0 > 2.27 > 2.24 after 80 mg of IV lasix yesterday. Still has JVD 7-8 cm with prominent CV waves  Weight 134 lbs (from 146 lbs) down 2 lbs from yesterday. Only down 0.2 L since yesterday  Much more alert this morning.  Denies SOB at rest and had less DOE when going to the bathroom.  Verbalized understanding of her current treatment and doesn't have any questions or concerns at this time.   Objective:   Weight Range: 134 lb 9.6 oz (61.054 kg)  Vital Signs:   Temp:  [97.2 F (36.2 C)-98.2 F (36.8 C)] 97.9 F (36.6 C) (06/08 0329) Pulse Rate:  [71-89] 79 (06/08 0500) Resp:  [12-23] 16 (06/08 0500) BP: (95-124)/(48-74) 120/64 mmHg (06/08 0545) SpO2:  [94 %-100 %] 95 % (06/08 0500) Weight:  [134 lb 9.6 oz (61.054 kg)] 134 lb 9.6 oz (61.054 kg) (06/08 0500) Last BM Date: 03/27/15  Weight change: Filed Weights   03/26/15 0600 03/27/15 0443 03/28/15 0500  Weight: 131 lb 3.2 oz (59.512 kg) 136 lb 7.4 oz (61.9 kg) 134 lb 9.6 oz (61.054 kg)    Intake/Output:   Intake/Output Summary (Last 24 hours) at 03/28/15 0715 Last data filed at 03/28/15 0400  Gross per 24 hour  Intake  626.6 ml  Output    850 ml  Net -223.4 ml     Physical Exam: General: NAD Neck: JVP 7-8 cm, no thyromegaly or thyroid nodule.  Lungs: bibasilar crackles, diminished L base. CV: Nondisplaced PMI. Heart regular S1/S2, 2/6 SEM RUSB. Trace bilateral ankle edema.  Abdomen: Soft, nontender, no hepatosplenomegaly, no  distention.  Neurologic: Alert and oriented x 3.  Psych: Normal affect. Extremities: No clubbing or cyanosis. Wound care in place LLE., Mild erythema.  TELEMETRY: Reviewed telemetry pt in NSR with v-pacing.  Labs: CBC  Recent Labs  03/27/15 0433  WBC 10.5  HGB 11.3*  HCT 36.0  MCV 94.5  PLT 236   Basic Metabolic Panel  Recent Labs  03/27/15 0433 03/28/15 0440  NA 135 133*  K 5.9* 4.4  CL 94* 94*  CO2 32 31  GLUCOSE 140* 87  BUN 51* 56*  CALCIUM 9.5 8.9   Liver Function Tests No results for input(s): AST, ALT, ALKPHOS, BILITOT, PROT, ALBUMIN in the last 72 hours. No results for input(s): LIPASE, AMYLASE in the last 72 hours. Cardiac Enzymes No results for input(s): CKTOTAL, CKMB, CKMBINDEX, TROPONINI in the last 72 hours.  BNP: BNP (last 3 results)  Recent Labs  02/26/15 1245 03/13/15 1028 03/16/15 1619  BNP 4185.3* 4332.8* >4500.0*    ProBNP (last 3 results) No results for input(s): PROBNP in the last 8760 hours.   D-Dimer No results for input(s): DDIMER in the last 72 hours. Hemoglobin A1C No results for input(s): HGBA1C in the last 72 hours. Fasting Lipid Panel No results for input(s): CHOL, HDL, LDLCALC, TRIG, CHOLHDL, LDLDIRECT in the last 72 hours. Thyroid Function Tests No results for input(s):  TSH, T4TOTAL, T3FREE, THYROIDAB in the last 72 hours.  Invalid input(s): FREET3  Other results:     Imaging/Studies:  No results found.  Latest Echo 02/03/2015  LV EF: 20% -  25%  ------------------------------------------------------------------- Indications:   CHF - 428.0.  ------------------------------------------------------------------- History:  PMH:  Mitral valve disease. Aortic valve disease. Risk factors: Hypertension. Diabetes mellitus. Dyslipidemia.  ------------------------------------------------------------------- Study Conclusions  - Left ventricle: The cavity size was severely dilated. Wall thickness was  increased in a pattern of mild LVH. Systolic function was severely reduced. The estimated ejection fraction was in the range of 20% to 25%. Diffuse hypokinesis. - Aortic valve: Bioprosthetic Aortic valve with no signs of AS or signficant perivalvular regurgitation. - Mitral valve: Bioporsthetic Mitral Valve with mild central MR and thickened leaflets Valve area by continuity equation (using LVOT flow): 0.48 cm^2. - Left atrium: The atrium was moderately to severely dilated. - Right ventricle: The cavity size was mildly dilated. - Right atrium: The atrium was mildly dilated. - Tricuspid valve: There was moderate regurgitation.   Latest Cath   Medications:     Scheduled Medications: . amiodarone  200 mg Oral BID  . antiseptic oral rinse  7 mL Mouth Rinse BID  . apixaban  2.5 mg Oral BID  . calcium-vitamin D  1 tablet Oral Q breakfast  . docusate sodium  100 mg Oral Daily  . ferrous sulfate  325 mg Oral BID WC  . hydrALAZINE  10 mg Oral 3 times per day  . insulin aspart  0-9 Units Subcutaneous TID WC  . insulin glargine  10 Units Subcutaneous QHS  . isosorbide mononitrate  15 mg Oral Daily  . linagliptin  5 mg Oral Daily  . sodium chloride  10-40 mL Intracatheter Q12H  . sodium chloride  10-40 mL Intracatheter Q12H    Infusions: . sodium chloride Stopped (03/19/15 0800)  . milrinone 0.25 mcg/kg/min (03/28/15 0242)    PRN Medications: acetaminophen, [DISCONTINUED] ondansetron **OR** ondansetron (ZOFRAN) IV, ondansetron, oxyCODONE, sodium chloride, sodium chloride   Assessment/Plan   81 yo with history of bioprosthetic MVR/AVR, nonischemic cardiomyopathy (EF 20-25% with LBBB), and CKD was admitted with acute on chronic systolic CHF with low output/volume overloaded state. AKI, lactate elevated. Initially hyperkalemic with altered mental status.  1. Acute on chronic systolic CHF -> cardiogenic shock: Nonischemic cardiomyopathy with chronic LBBB. EF 20-25%. She  has struggled recently with volume overload. St Jude CRT-D placed 6/1. Held milrinone 0.125 for co-ox trial 03/26/15.  Showed slight improvement of co-ox but became symptomatically worse.    2. AKI on CKD: Suspect cardiorenal syndrome. Cr 2.0 > 2.27 > 2.24 Today. 3. ID: Treated for UTI with ceftriaxone (E coli).  4. Atrial fibrillation: Paroxysmal. Has history of post-op afib. NSR with frequent PVCs and multiple short runs atrial fibrillation on milrinone, consistently in NSR on amiodarone. Now on Eliquis and amio 5. Bioprosthetic MV/AoV: Stable on last echo.  6. Fractured left ankle: cast off 6/3, needs PT.  7. Plan for SNF at discharge.   Will need home inotropes, but have some concern with new device implant and risk of infection. Was symptomatic yesterday after holding milrinone through the night.  Her co-ox went down even back on the milrinone, but she has improved  symptomatically since yesterday. Will repeat co-ox. K stable, will give 40 mEq daily with resumption of diuresis Cr stable with one dose of IV lasix 80 mg past two days. May be nearing baseline fluid, but still seems overloaded with large  amount of JVP and CVP of 13. Will give another dose of 80 mg IV lasix and re-assess. Maintaining NSR on amio. Continue Eliquis. Will discuss with MD. Will consult palliative care since going home on milrinone.  Dispo: To inotrope capable SNF when stable, hope in next day or two.    Length of Stay: [address]   Graciella Freer PA-C 03/28/2015, 7:15 AM  Advanced Heart Failure Team Pager [phone] (M-F; 7a - 4p)  Please contact [facility] for night-coverage after hours (4p -7a ) and weekends on [email or web address]   Patient seen and examined with Otilio Saber, PA-C. We discussed all aspects of the encounter. I agree with the assessment and plan as stated above.   Ms. Calais feels better today but co-ox much worse despite milrinone support. CVP 10-11 range. I had long talk with her today about  the fact that I feel she is running out of options and I talked to her about code status. At that point she began to cry and said, "I just want to live as long as I can for my 2 kids." She was unable to finish the discussion with me or the Palliative Care team. Will continue milrinone and IV lasix today. Recheck co-ox in am. I told her I would come back and speak with her and her daughter tonight.   Keshon Markovitz,MD 5:00 PM

## 2015-03-28 NOTE — Progress Notes (Signed)
CARDIAC REHAB PHASE I   PRE:  Rate/Rhythm: 78 pacing  BP:  Supine: 101/55  Sitting:   Standing:    SaO2: 97% 2L  MODE:  Ambulation: 160 ft   POST:  Rate/Rhythm: 92 paced  BP:  Supine:   Sitting: 110/75  Standing:    SaO2: 100%RA 1200-1240 Pt walked 160 ft on RA with cam boot on, gait belt use and asst x 1 with rolling walker. Tolerated well on RA. Stayed more in middle of hall this walk. To recliner after walk. Left off oxygen after walk and keep pulse ox monitor on. Call bell in reach. To BSC prior to walk.   Luetta Nutting, RN BSN  03/28/2015 12:34 PM

## 2015-03-28 NOTE — Progress Notes (Signed)
[facility]  Patient Status: New pt for [facility] this admission  [facility] is providing the following services: [facility] will provide Home infusion pharmacy services for Milrinone for pt while at [facility].  I have been in contact with Reyne Dumas at [facility]. We are both prepared for pt when deemed appropriate for DC from hospital to SNF.  [facility] hospital team will follow Ms. Carlee to support transition to SNF at DC.   If patient discharges after hours, please call [phone].   Sedalia Muta 03/28/2015, 7:44 AM

## 2015-03-29 DIAGNOSIS — I5022 Chronic systolic (congestive) heart failure: Secondary | ICD-10-CM

## 2015-03-29 LAB — BASIC METABOLIC PANEL
Anion gap: 11 (ref 5–15)
BUN: 55 mg/dL — AB (ref 6–20)
CALCIUM: 8.8 mg/dL — AB (ref 8.9–10.3)
CO2: 28 mmol/L (ref 22–32)
Chloride: 94 mmol/L — ABNORMAL LOW (ref 101–111)
Creatinine, Ser: 2.29 mg/dL — ABNORMAL HIGH (ref 0.44–1.00)
GFR, EST AFRICAN AMERICAN: 22 mL/min — AB (ref >60)
GFR, EST NON AFRICAN AMERICAN: 19 mL/min — AB (ref >60)
Glucose, Bld: 132 mg/dL — ABNORMAL HIGH (ref 65–99)
POTASSIUM: 4.3 mmol/L (ref 3.5–5.1)
SODIUM: 133 mmol/L — AB (ref 135–145)

## 2015-03-29 LAB — GLUCOSE, CAPILLARY
GLUCOSE-CAPILLARY: 125 mg/dL — AB (ref 65–99)
GLUCOSE-CAPILLARY: 146 mg/dL — AB (ref 65–99)
Glucose-Capillary: 131 mg/dL — ABNORMAL HIGH (ref 65–99)
Glucose-Capillary: 147 mg/dL — ABNORMAL HIGH (ref 65–99)

## 2015-03-29 LAB — CARBOXYHEMOGLOBIN
Carboxyhemoglobin: 1.4 % (ref 0.5–1.5)
METHEMOGLOBIN: 0.9 % (ref 0.0–1.5)
O2 SAT: 59.2 %
TOTAL HEMOGLOBIN: 10.6 g/dL — AB (ref 12.0–16.0)

## 2015-03-29 MED ORDER — METOLAZONE 2.5 MG PO TABS
2.5000 mg | ORAL_TABLET | Freq: Once | ORAL | Status: AC
Start: 2015-03-29 — End: 2015-03-29
  Administered 2015-03-29: 2.5 mg via ORAL
  Filled 2015-03-29: qty 1

## 2015-03-29 MED ORDER — FUROSEMIDE 10 MG/ML IJ SOLN
60.0000 mg | Freq: Two times a day (BID) | INTRAMUSCULAR | Status: DC
  Administered 2015-03-29: 60 mg via INTRAVENOUS
  Filled 2015-03-29: qty 6

## 2015-03-29 MED ORDER — FUROSEMIDE 80 MG PO TABS
80.0000 mg | ORAL_TABLET | Freq: Every day | ORAL | Status: DC
  Administered 2015-03-29 – 2015-03-30 (×2): 80 mg via ORAL
  Filled 2015-03-29: qty 1
  Filled 2015-03-29: qty 2
  Filled 2015-03-29: qty 1

## 2015-03-29 NOTE — Progress Notes (Signed)
Advanced Heart Failure Rounding Note   Subjective:    Admitted with volume overload, AKI, low output physiology with elevated lactate. She was begun on milrinone gtt. She had short runs atrial fibrillation on milrinone and has been on amiodarone as well, in NSR currently.   St Jude CRT-D placed 6/1. Switched to po Lasix on 6/3 Milrinone was d/c'd last night to assess co-ox off of it. She slowed slight improvement. Co-ox 64.4% --> 49.2 % --> 42.6 % --> 59.2  Creatinine stable 2.27 > 2.24 > 2.29 after another 80 mg of IV lasix yesterday. CVP 11 with prominent CV waves  Weight 136 lbs (from 146 lbs). Only down 0.1 L since yesterday. Urine output sluggish.   Lying flat in bed without difficulty on my arrival.  Says she has minimal DOE when going to the bathroom.  She says she is capturing all of your urine when asked about concern for only out 0.1 L fluid yesterday.  Still seems slightly depressed from goals of care talks yesterday.   Objective:   Weight Range: 136 lb 4.8 oz (61.825 kg)  Vital Signs:   Temp:  [97.4 F (36.3 C)-98.3 F (36.8 C)] 97.4 F (36.3 C) (06/09 0307) Pulse Rate:  [78-88] 78 (06/09 0600) Resp:  [14-21] 14 (06/09 0600) BP: (97-135)/(48-77) 101/48 mmHg (06/09 0600) SpO2:  [94 %-99 %] 98 % (06/09 0600) Weight:  [136 lb 4.8 oz (61.825 kg)] 136 lb 4.8 oz (61.825 kg) (06/09 0600) Last BM Date: 03/28/15  Weight change: Filed Weights   03/27/15 0443 03/28/15 0500 03/29/15 0600  Weight: 136 lb 7.4 oz (61.9 kg) 134 lb 9.6 oz (61.054 kg) 136 lb 4.8 oz (61.825 kg)    Intake/Output:   Intake/Output Summary (Last 24 hours) at 03/29/15 0706 Last data filed at 03/29/15 0600  Gross per 24 hour  Intake  945.8 ml  Output    852 ml  Net   93.8 ml     Physical Exam: General: NAD Neck: JVP to jaw, no thyromegaly or thyroid nodule.  Lungs: clear CV: Nondisplaced PMI. Heart regular S1/S2, 2/6 SEM RUSB. Trace bilateral ankle edema.  Abdomen: Soft, nontender, no  hepatosplenomegaly, no distention.  Neurologic: Alert and oriented x 3.  Psych: Flattened affect compared to last several rounds. Extremities: No clubbing or cyanosis. Wound care in place LLE, Mild erythema.  TELEMETRY: Reviewed telemetry pt in NSR with v-pacing.  Labs: CBC  Recent Labs  03/27/15 0433  WBC 10.5  HGB 11.3*  HCT 36.0  MCV 94.5  PLT 236   Basic Metabolic Panel  Recent Labs  03/28/15 0440 03/29/15 0430  NA 133* 133*  K 4.4 4.3  CL 94* 94*  CO2 31 28  GLUCOSE 87 132*  BUN 56* 55*  CALCIUM 8.9 8.8*   Liver Function Tests No results for input(s): AST, ALT, ALKPHOS, BILITOT, PROT, ALBUMIN in the last 72 hours. No results for input(s): LIPASE, AMYLASE in the last 72 hours. Cardiac Enzymes No results for input(s): CKTOTAL, CKMB, CKMBINDEX, TROPONINI in the last 72 hours.  BNP: BNP (last 3 results)  Recent Labs  02/26/15 1245 03/13/15 1028 03/16/15 1619  BNP 4185.3* 4332.8* >4500.0*    ProBNP (last 3 results) No results for input(s): PROBNP in the last 8760 hours.   D-Dimer No results for input(s): DDIMER in the last 72 hours. Hemoglobin A1C No results for input(s): HGBA1C in the last 72 hours. Fasting Lipid Panel No results for input(s): CHOL, HDL, LDLCALC, TRIG, CHOLHDL, LDLDIRECT in  the last 72 hours. Thyroid Function Tests No results for input(s): TSH, T4TOTAL, T3FREE, THYROIDAB in the last 72 hours.  Invalid input(s): FREET3  Other results:     Imaging/Studies:  No results found.  Latest Echo 02/03/2015  LV EF: 20% -  25%  ------------------------------------------------------------------- Indications:   CHF - 428.0.  ------------------------------------------------------------------- History:  PMH:  Mitral valve disease. Aortic valve disease. Risk factors: Hypertension. Diabetes mellitus. Dyslipidemia.  ------------------------------------------------------------------- Study Conclusions  - Left ventricle:  The cavity size was severely dilated. Wall thickness was increased in a pattern of mild LVH. Systolic function was severely reduced. The estimated ejection fraction was in the range of 20% to 25%. Diffuse hypokinesis. - Aortic valve: Bioprosthetic Aortic valve with no signs of AS or signficant perivalvular regurgitation. - Mitral valve: Bioporsthetic Mitral Valve with mild central MR and thickened leaflets Valve area by continuity equation (using LVOT flow): 0.48 cm^2. - Left atrium: The atrium was moderately to severely dilated. - Right ventricle: The cavity size was mildly dilated. - Right atrium: The atrium was mildly dilated. - Tricuspid valve: There was moderate regurgitation.   Latest Cath   Medications:     Scheduled Medications: . amiodarone  200 mg Oral BID  . antiseptic oral rinse  7 mL Mouth Rinse BID  . apixaban  2.5 mg Oral BID  . calcium-vitamin D  1 tablet Oral Q breakfast  . docusate sodium  100 mg Oral Daily  . ferrous sulfate  325 mg Oral BID WC  . hydrALAZINE  10 mg Oral 3 times per day  . insulin aspart  0-9 Units Subcutaneous TID WC  . insulin glargine  10 Units Subcutaneous QHS  . isosorbide mononitrate  15 mg Oral Daily  . linagliptin  5 mg Oral Daily  . potassium chloride  40 mEq Oral Daily  . sodium chloride  10-40 mL Intracatheter Q12H  . sodium chloride  10-40 mL Intracatheter Q12H    Infusions: . sodium chloride Stopped (03/19/15 0800)  . milrinone 0.25 mcg/kg/min (03/28/15 2333)    PRN Medications: acetaminophen, [DISCONTINUED] ondansetron **OR** ondansetron (ZOFRAN) IV, ondansetron, oxyCODONE, sodium chloride, sodium chloride   Assessment/Plan   81 yo with history of bioprosthetic MVR/AVR, nonischemic cardiomyopathy (EF 20-25% with LBBB), and CKD was admitted with acute on chronic systolic CHF with low output/volume overloaded state. AKI, lactate elevated. Initially hyperkalemic with altered mental status.  1. Acute on  chronic systolic CHF -> cardiogenic shock: Nonischemic cardiomyopathy with chronic LBBB. EF 20-25%. She has struggled recently with volume overload. St Jude CRT-D placed 6/1. Held milrinone 0.125 for co-ox trial 03/26/15.  Showed slight improvement of co-ox but became symptomatically worse and co-ox gradually worsened.  2. AKI on CKD: Suspect cardiorenal syndrome. 2.27 > 2.24 > 2.29 Today. 3. ID: Treated for UTI with ceftriaxone (E coli).  4. Atrial fibrillation: Paroxysmal. Has history of post-op afib. NSR with frequent PVCs and multiple short runs atrial fibrillation on milrinone, consistently in NSR on amiodarone. Now on Eliquis and amio 5. Bioprosthetic MV/AoV: Stable on last echo.  6. Fractured left ankle: cast off 6/3, needs PT.  7. Plan for SNF on home inotropes at discharge.   Maintaining NSR on amio. She was unable to finish a goals of care talk with Dr. Gala Romney or the Palliative Care team.  Co-ox improved from yesterday to (49.2 and 42.6) to 59.2.  Creatinine stable. K stable.   Spoke with pharm and think it is reasonable to space out Diuresis for better coverage and giving  60 mg IV Lasix BID today.  Dispo: To inotrope capable SNF when stable, hope in next day or two   Length of Stay: [address] Graciella Freer PA-C 03/29/2015, 7:06 AM  Advanced Heart Failure Team Pager [phone] (M-F; 7a - 4p)  Please contact [facility] for night-coverage after hours (4p -7a ) and weekends on [email or web address]  Patient seen and examined with Otilio Saber, PA-C. We discussed all aspects of the encounter. I agree with the assessment and plan as stated above.   Overall stable on milrinone. Co-ox slightly improved. Long talk with patient and her family about situation and goals of care. Will continue milrinone. Would like to go to [facility] but they don't take milrinone. They are interested in [name]. Will s/w CM and SW. Possible d/c on milrinone tomorrow. Will change code status to DNR. Will need  to ask about deactivation of ICD shocking capabilities.   Zaccheaus Storlie,MD 1:08 PM

## 2015-03-29 NOTE — Progress Notes (Signed)
Physical Therapy Treatment Patient Details Name: Joanna [name] MRN: [id number] DOB: September 04, 1930 Today's Date: 04/09/15    History of Present Illness Joanna [name] is a 81 y.o. 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 This admission s/p cardiogenic shock with pacemaker replacement 6/1. Cast removed 6/3 with pt now WBAT in CAM boot    PT Comments    Patient making good gains with mobility and gait.  Follow Up Recommendations  SNF;Supervision/Assistance - 24 hour     Equipment Recommendations  None recommended by PT    Recommendations for Other Services       Precautions / Restrictions Precautions Precautions: ICD/Pacemaker;Fall Required Braces or Orthoses: Other Brace/Splint Other Brace/Splint: CAM boot RLE with all OOB activity Restrictions Weight Bearing Restrictions: Yes RLE Weight Bearing: Weight bearing as tolerated    Mobility  Bed Mobility               General bed mobility comments: Patient in recliner as PT entered room  Transfers Overall transfer level: Needs assistance Equipment used: Rolling walker (2 wheeled) Transfers: Sit to/from Stand Sit to Stand: Min assist         General transfer comment: Verbal cues for hand placement.  Assist to steady during transfers.  Ambulation/Gait Ambulation/Gait assistance: Min guard Ambulation Distance (Feet): 250 Feet Assistive device: Rolling walker (2 wheeled) Gait Pattern/deviations: Step-through pattern;Decreased stride length Gait velocity: Decreased Gait velocity interpretation: Below normal speed for age/gender General Gait Details: Patient with slow, steady gait.  Good balance with RW.   Stairs            Wheelchair Mobility    Modified Rankin (Stroke Patients Only)       Balance                                     Cognition Arousal/Alertness: Awake/alert Behavior During Therapy: WFL for tasks assessed/performed Overall Cognitive Status: Within Functional Limits for tasks assessed                      Exercises      General Comments        Pertinent Vitals/Pain Pain Assessment: No/denies pain    Home Living                      Prior Function            PT Goals (current goals can now be found in the care plan section) Progress towards PT goals: Progressing toward goals    Frequency  Min 3X/week    PT Plan Current plan remains appropriate    Co-evaluation             End of Session Equipment Utilized During Treatment: Gait belt Activity Tolerance: Patient tolerated treatment well Patient left: in chair;with call bell/phone within reach     Time: 1735-1752 PT Time Calculation (min) (ACUTE ONLY): 17 min  Charges:  $Gait Training: 8-22 mins                    G Codes:      Vena Austria 04-09-15, 6:01 PM Durenda Hurt. Renaldo Fiddler, [facility] Acute Rehab Services Pager [phone]

## 2015-03-29 NOTE — Clinical Social Work Note (Signed)
CSW received phone call from case manager who stated family would like CSW to check if [name] can take Milrinone drip because they would prefer to go there if possible.  CSW contacted [facility] and asked admissions if they can take patient, admissions said they have to discuss with nursing and requested updated clinicals.  CSW faxed updated clinical to facility and are waiting call back.  CSW notified patient's daughter [name] with updated information, and CSW will call daughter back once facility calls back.  CSW to continue to follow patient.  Joanna [name]. Leydy Worthey, MSW, Theresia Majors [phone] 03/29/2015 2:11 PM

## 2015-03-29 NOTE — Progress Notes (Signed)
CARDIAC REHAB PHASE I   PRE:  Rate/Rhythm: 89paced  BP:  Supine:   Sitting: 103/66  Standing:    SaO2: 96%RA  MODE:  Ambulation: 270 ft   POST:  Rate/Rhythm: 99 paced   BP:  Supine:   Sitting: 122/67  Standing:    SaO2: 97%RA 1135-1210 Pt walked 270 ft on RA after putting on cam boot with rolling walker and asst x 1. Tolerated well. C/o slightly SOB. Sats good on RA. To recliner after walk. Family in room. Encouraged to walk in middle of hall. A little tendency to get close to wall.   Luetta Nutting, RN BSN  03/29/2015 12:05 PM

## 2015-03-29 NOTE — Progress Notes (Signed)
nse alerted cm that pt's fam would prefer [facility] for snf if possible. Relayed this to sw erik and he is ck on this and will get back w family.

## 2015-03-29 NOTE — Progress Notes (Signed)
Daily Progress Note   Patient Name: Joanna [name]       Date: 03/29/2015 DOB: 01/11/1930  Age: 81 y.o. MRN#: [id number] Attending Physician: Dorothy Spark, MD Primary Care Physician: Scarlette Calico, MD Admit Date: 03/16/2015  Reason for Consultation/Follow-up: Establishing goals of care  Subjective: Reports feeling good today. Appetite doing well. No n/v. Denies pain/dyspnea.   Length of Stay: 13 days  Current Medications: Scheduled Meds:  . amiodarone  200 mg Oral BID  . antiseptic oral rinse  7 mL Mouth Rinse BID  . apixaban  2.5 mg Oral BID  . calcium-vitamin D  1 tablet Oral Q breakfast  . docusate sodium  100 mg Oral Daily  . ferrous sulfate  325 mg Oral BID WC  . furosemide  60 mg Intravenous BID  . hydrALAZINE  10 mg Oral 3 times per day  . insulin aspart  0-9 Units Subcutaneous TID WC  . insulin glargine  10 Units Subcutaneous QHS  . isosorbide mononitrate  15 mg Oral Daily  . linagliptin  5 mg Oral Daily  . metolazone  2.5 mg Oral Once  . potassium chloride  40 mEq Oral Daily  . sodium chloride  10-40 mL Intracatheter Q12H  . sodium chloride  10-40 mL Intracatheter Q12H    Continuous Infusions: . sodium chloride Stopped (03/19/15 0800)  . milrinone 0.25 mcg/kg/min (03/29/15 0800)    PRN Meds: acetaminophen, [DISCONTINUED] ondansetron **OR** ondansetron (ZOFRAN) IV, ondansetron, oxyCODONE, sodium chloride, sodium chloride  Vital Signs: BP 96/58 mmHg  Pulse 76  Temp(Src) 98.2 F (36.8 C) (Oral)  Resp 17  Ht 5' 4"  (1.626 m)  Wt 61.825 kg (136 lb 4.8 oz)  BMI 23.38 kg/m2  SpO2 96% SpO2: SpO2: 96 % O2 Device: O2 Device: Not Delivered O2 Flow Rate: O2 Flow Rate (L/min): 2 L/min  Intake/output summary:  Intake/Output Summary (Last 24 hours) at 03/29/15 0955 Last data filed at 03/29/15 0800  Gross per 24 hour  Intake  585.8 ml  Output    427 ml  Net  158.8 ml   Baseline Weight: Weight: 56.246 kg (124 lb) Most recent weight: Weight: 61.825 kg (136 lb  4.8 oz)  Physical Exam: GEN: alert, NAD HEENT: [place], sclera anicteric EXT: no significant perip edema              Additional Data Reviewed: Recent Labs     03/27/15  0433  03/28/15  0440  03/29/15  0430  WBC  10.5   --    --   HGB  11.3*   --    --   PLT  236   --    --   NA  135  133*  133*  BUN  51*  56*  55*  CREATININE  2.27*  2.24*  2.29*     Problem List:  Patient Active Problem List   Diagnosis Date Noted  . Palliative care encounter   . Dyspnea and respiratory abnormality   . NICM (nonischemic cardiomyopathy)   . LBBB (left bundle branch block)   . Cardiogenic shock 03/17/2015  . Acute systolic congestive heart failure, NYHA class 4 03/17/2015  . Hypoglycemia 03/17/2015  . Lactic acid acidosis   . Hyperkalemia 03/16/2015  . Nausea & vomiting 03/16/2015  . Renal failure (ARF), acute on chronic 03/16/2015  . Acute renal failure syndrome   . Malnutrition of moderate degree 02/01/2015  . Ankle fracture   . Closed bimalleolar fracture of right ankle 01/31/2015  .  Hyperglycemia 01/30/2015  . Acute on chronic systolic CHF (congestive heart failure) 12/21/2014  . PAF (paroxysmal atrial fibrillation) 12/15/2014  . Acute on chronic diastolic congestive heart failure 12/13/2014  . Acute on chronic combined systolic and diastolic CHF, NYHA class 3 12/13/2014  . CKD (chronic kidney disease) stage 3, GFR 30-59 ml/min 02/06/2014  . Encounter for therapeutic drug monitoring 11/11/2013  . Chronic diastolic CHF (congestive heart failure) 09/22/2013  . Esophageal dysphagia 08/03/2013  . S/P aortic and mitral valve replacement with bioprosthetic valves 06/15/2013  . S/P mitral and aortic valve replacement with bioprosthetic valve 06/15/2013  . Mitral regurgitation   . Routine health maintenance 11/13/2011  . Chronic systolic heart failure 67/61/9509  . Iron deficiency anemia 09/21/2011  . PULMONARY HYPERTENSION 03/23/2009  . Carotid arterial disease 01/05/2009  . DM  (diabetes mellitus), type 2 with renal complications 32/67/1245  . Hyperlipidemia with target LDL less than 100 07/30/2007  . Essential hypertension 07/30/2007     Palliative Care Assessment & Plan  81 yo female with biprosthetic MVR/RVR, CKD, NICM w/LBBB s/p CRT. Admitted 5/27 with CHF exacerbation. Now on milrinone drip.   Code Status:  Full code  Goals of Care:  Met with daughter [name], son [name], and [name] this morning. Reviewed clinical situation and where things stand regarding milrinone drip.  We also talked about concerns going forward and potential for setbacks despite this intervention. We discussed that at some point down the road perhaps even in next few months milrinone may stop being effective and we may need to transition to comfort based approach. Also high risk for setbacks from volume shifts and renal dysfunction.  Very difficult conversation, especially for [name].  I think [name] and [name] have had more thought about this in past. [name] described herself as a Nurse, adult and how she wants to do all she can to "get better".  We talked about ongoing support from HF team but unfortunately not a lot of good options left after inotrope infusions.  Family states they have tried to talk to her about advance directives in the past, but she always refuses.  I encouraged them all to start having these conversations now before crisis situation were to occur.  They plan on talking with Dr Valarie Cones group today as well.  Plan will likely be SNF with milrinone and they are hopeful of eventual transition back home.    3. Symptom Management: Dyspnea- resolved  4. Prognosis: likely less than 6 months  5. Discharge Planning: [place] for rehab with Palliative care service follow-up   Care plan was discussed with patient/family  Thank you for allowing the Palliative Medicine Team to assist in the care of this patient.   Total Time: 50 minutes Greater than 50%  of  this time was spent counseling and coordinating care related to the above assessment and plan.   Doran Clay, DO  03/29/2015, 9:55 AM  Please contact Palliative Medicine Team phone at [phone] for questions and concerns.

## 2015-03-29 NOTE — Discharge Instructions (Signed)

## 2015-03-30 ENCOUNTER — Encounter: Payer: Self-pay | Admitting: *Deleted

## 2015-03-30 LAB — GLUCOSE, CAPILLARY
GLUCOSE-CAPILLARY: 78 mg/dL (ref 65–99)
GLUCOSE-CAPILLARY: 108 mg/dL — AB (ref 65–99)

## 2015-03-30 LAB — CBC
HCT: 31.7 % — ABNORMAL LOW (ref 36.0–46.0)
Hemoglobin: 10.1 g/dL — ABNORMAL LOW (ref 12.0–15.0)
MCH: 29.9 pg (ref 26.0–34.0)
MCHC: 31.9 g/dL (ref 30.0–36.0)
MCV: 93.8 fL (ref 78.0–100.0)
Platelets: 226 10*3/uL (ref 150–400)
RBC: 3.38 MIL/uL — ABNORMAL LOW (ref 3.87–5.11)
RDW: 15.8 % — ABNORMAL HIGH (ref 11.5–15.5)
WBC: 9.4 10*3/uL (ref 4.0–10.5)

## 2015-03-30 LAB — CARBOXYHEMOGLOBIN
Carboxyhemoglobin: 1.8 % — ABNORMAL HIGH (ref 0.5–1.5)
METHEMOGLOBIN: 1 % (ref 0.0–1.5)
O2 SAT: 65.9 %
Total hemoglobin: 10.3 g/dL — ABNORMAL LOW (ref 12.0–16.0)

## 2015-03-30 LAB — BASIC METABOLIC PANEL
Anion gap: 10 (ref 5–15)
BUN: 50 mg/dL — AB (ref 6–20)
CHLORIDE: 95 mmol/L — AB (ref 101–111)
CO2: 30 mmol/L (ref 22–32)
Calcium: 9.1 mg/dL (ref 8.9–10.3)
Creatinine, Ser: 2.31 mg/dL — ABNORMAL HIGH (ref 0.44–1.00)
GFR calc Af Amer: 21 mL/min — ABNORMAL LOW (ref >60)
GFR, EST NON AFRICAN AMERICAN: 19 mL/min — AB (ref >60)
Glucose, Bld: 118 mg/dL — ABNORMAL HIGH (ref 65–99)
Potassium: 3.2 mmol/L — ABNORMAL LOW (ref 3.5–5.1)
SODIUM: 135 mmol/L (ref 135–145)

## 2015-03-30 MED ORDER — APIXABAN 2.5 MG PO TABS: 2.5000 mg | ORAL_TABLET | Freq: Two times a day (BID) | ORAL | Status: DC

## 2015-03-30 MED ORDER — POTASSIUM CHLORIDE CRYS ER 20 MEQ PO TBCR: 20.0000 meq | EXTENDED_RELEASE_TABLET | Freq: Two times a day (BID) | ORAL | Status: DC

## 2015-03-30 MED ORDER — METOLAZONE 5 MG PO TABS: 5.0000 mg | ORAL_TABLET | ORAL | Status: DC

## 2015-03-30 MED ORDER — MILRINONE IN DEXTROSE 20 MG/100ML IV SOLN: 0.2500 ug/kg/min | INTRAVENOUS | Status: DC

## 2015-03-30 MED ORDER — HYDRALAZINE HCL 10 MG PO TABS: 10.0000 mg | ORAL_TABLET | Freq: Three times a day (TID) | ORAL | Status: DC

## 2015-03-30 MED ORDER — POTASSIUM CHLORIDE CRYS ER 20 MEQ PO TBCR
40.0000 meq | EXTENDED_RELEASE_TABLET | Freq: Two times a day (BID) | ORAL | Status: DC
  Administered 2015-03-30: 40 meq via ORAL

## 2015-03-30 MED ORDER — ISOSORBIDE MONONITRATE ER 30 MG PO TB24: 15.0000 mg | ORAL_TABLET | Freq: Every day | ORAL | Status: DC

## 2015-03-30 MED ORDER — FUROSEMIDE 40 MG PO TABS
60.0000 mg | ORAL_TABLET | Freq: Two times a day (BID) | ORAL | Status: DC
Start: 2015-03-30 — End: 2015-04-09

## 2015-03-30 MED ORDER — AMIODARONE HCL 200 MG PO TABS: 200.0000 mg | ORAL_TABLET | Freq: Two times a day (BID) | ORAL | Status: DC

## 2015-03-30 NOTE — Clinical Social Work Placement (Signed)
   CLINICAL SOCIAL WORK PLACEMENT  NOTE  Date:  03/30/2015  Patient Details  Name: Joanna [name] MRN: [id number] Date of Birth: [birth year]  Clinical Social Work is seeking post-discharge placement for this patient at the Skilled  Nursing Facility level of care (*CSW will initial, date and re-position this form in  chart as items are completed):  Yes   Patient/family provided with [place] Clinical Social Work Department's list of facilities offering this level of care within the geographic area requested by the patient (or if unable, by the patient's family).  Yes   Patient/family informed of their freedom to choose among providers that offer the needed level of care, that participate in Medicare, Medicaid or managed care program needed by the patient, have an available bed and are willing to accept the patient.  Yes   Patient/family informed of [facility]'s ownership interest in [facility] and [facility], as well as of the fact that they are under no obligation to receive care at these facilities.  PASRR submitted to EDS on       PASRR number received on       Existing PASRR number confirmed on 03/23/15     FL2 transmitted to all facilities in geographic area requested by pt/family on 03/23/15     FL2 transmitted to all facilities within larger geographic area on       Patient informed that his/her managed care company has contracts with or will negotiate with certain facilities, including the following:        Yes   Patient/family informed of bed offers received.  Patient chooses bed at [facility]     Physician recommends and patient chooses bed at      Patient to be transferred to [facility] on 03/30/15.  Patient to be transferred to facility by ptar     Patient family notified on 03/30/15 of transfer.  Name of family member notified:  barbara     PHYSICIAN Please sign FL2     Additional Comment:     _______________________________________________ Izora Ribas, LCSW 03/30/2015, 2:21 PM

## 2015-03-30 NOTE — Patient Outreach (Signed)
[facility]) Care Management  03/30/2015  Joanna [name] 1930-04-28 [id number]   Referral from Raiford Noble, RN for SW followup at [facility], assigned [facility], LCSW.  Corrie Mckusick. Sharlee Blew [facility] Care Management [facility] CM Assistant Phone: [phone] Fax: [phone]

## 2015-03-30 NOTE — Progress Notes (Signed)
Alerted pam w [facility] pt for dc to snf today and change in home milrinone dose. sw making arrangements for snf today.

## 2015-03-30 NOTE — Progress Notes (Signed)
Patient will discharge to [facility] Anticipated discharge date:03/30/15 Family notified: pt dtr [name]) Transportation by [facility]- RN to call when we have received the milrinone drip from [facility]- approx 4pm  CSW signing off.  Merlyn Lot, LCSWA Clinical Social Worker [phone]

## 2015-03-30 NOTE — Clinical Social Work Note (Signed)
Handoff given to unit CSW Raymond, [phone], this CSW to sign off.  Ervin Knack. Maysa Lynn, MSW, LCSWA [phone] 03/30/2015 10:41 AM

## 2015-03-30 NOTE — Progress Notes (Addendum)
Advanced Heart Failure Rounding Note   Subjective:    Admitted with volume overload, AKI, low output physiology with elevated lactate. She was begun on milrinone gtt. She had short runs atrial fibrillation on milrinone and has been on amiodarone as well, in NSR currently.   St Jude CRT-D placed 6/1. Switched to po Lasix on 6/3 Milrinone was d/c'd last night to assess co-ox off of it. She slowed slight improvement. Co-ox 64.4% --> 49.2% --> 42.6% --> 59.2% --> 65.9%  Creatinine stable 2.24 > 2.29 > 2.31 after switching to 80 mg Oral lasix with a metolazone yesterday.  CVP still up at 14 with prominent CV waves  Weight 132 lbs (from 146 lbs). Down 0.5 L since yesterday.   Lying flat in bed without difficulty on my arrival.  Says her breathing is much better and denies DOE when going to the bathroom.  Ready to leave today.   Objective:   Weight Range: 132 lb 11.5 oz (60.2 kg)  Vital Signs:   Temp:  [98 F (36.7 C)-98.2 F (36.8 C)] 98 F (36.7 C) (06/09 2000) Pulse Rate:  [38-90] 90 (06/10 0400) Resp:  [14-23] 16 (06/10 0400) BP: (96-125)/(54-80) 108/80 mmHg (06/10 0400) SpO2:  [86 %-97 %] 93 % (06/10 0400) Weight:  [132 lb 11.5 oz (60.2 kg)] 132 lb 11.5 oz (60.2 kg) (06/10 0400) Last BM Date: 03/28/15  Weight change: Filed Weights   03/28/15 0500 03/29/15 0600 03/30/15 0400  Weight: 134 lb 9.6 oz (61.054 kg) 136 lb 4.8 oz (61.825 kg) 132 lb 11.5 oz (60.2 kg)    Intake/Output:   Intake/Output Summary (Last 24 hours) at 03/30/15 0706 Last data filed at 03/30/15 0600  Gross per 24 hour  Intake  710.4 ml  Output   1250 ml  Net -539.6 ml     Physical Exam: General: NAD Neck: JVP 9-10, no thyromegaly or thyroid nodule.  Lungs: clear CV: Nondisplaced PMI. Heart regular S1/S2, 2/6 SEM RUSB. Trace bilateral ankle edema.  Abdomen: Soft, nontender, no hepatosplenomegaly, no distention.  Neurologic: Alert and oriented x 3.  Psych: Flattened affect compared to last  several rounds. Extremities: No clubbing or cyanosis. Wound care in place LLE, Mild erythema.  TELEMETRY: Reviewed telemetry pt in NSR with v-pacing and occasional PVCs.  Labs: CBC  Recent Labs  03/30/15 0500  WBC 9.4  HGB 10.1*  HCT 31.7*  MCV 93.8  PLT 226   Basic Metabolic Panel  Recent Labs  03/29/15 0430 03/30/15 0500  NA 133* 135  K 4.3 3.2*  CL 94* 95*  CO2 28 30  GLUCOSE 132* 118*  BUN 55* 50*  CALCIUM 8.8* 9.1   Liver Function Tests No results for input(s): AST, ALT, ALKPHOS, BILITOT, PROT, ALBUMIN in the last 72 hours. No results for input(s): LIPASE, AMYLASE in the last 72 hours. Cardiac Enzymes No results for input(s): CKTOTAL, CKMB, CKMBINDEX, TROPONINI in the last 72 hours.  BNP: BNP (last 3 results)  Recent Labs  02/26/15 1245 03/13/15 1028 03/16/15 1619  BNP 4185.3* 4332.8* >4500.0*    ProBNP (last 3 results) No results for input(s): PROBNP in the last 8760 hours.   D-Dimer No results for input(s): DDIMER in the last 72 hours. Hemoglobin A1C No results for input(s): HGBA1C in the last 72 hours. Fasting Lipid Panel No results for input(s): CHOL, HDL, LDLCALC, TRIG, CHOLHDL, LDLDIRECT in the last 72 hours. Thyroid Function Tests No results for input(s): TSH, T4TOTAL, T3FREE, THYROIDAB in the last 72 hours.  Invalid input(s): FREET3  Other results:     Imaging/Studies:  No results found.  Latest Echo 02/03/2015  LV EF: 20% -  25%  ------------------------------------------------------------------- Indications:   CHF - 428.0.  ------------------------------------------------------------------- History:  PMH:  Mitral valve disease. Aortic valve disease. Risk factors: Hypertension. Diabetes mellitus. Dyslipidemia.  ------------------------------------------------------------------- Study Conclusions  - Left ventricle: The cavity size was severely dilated. Wall thickness was increased in a pattern of mild LVH.  Systolic function was severely reduced. The estimated ejection fraction was in the range of 20% to 25%. Diffuse hypokinesis. - Aortic valve: Bioprosthetic Aortic valve with no signs of AS or signficant perivalvular regurgitation. - Mitral valve: Bioporsthetic Mitral Valve with mild central MR and thickened leaflets Valve area by continuity equation (using LVOT flow): 0.48 cm^2. - Left atrium: The atrium was moderately to severely dilated. - Right ventricle: The cavity size was mildly dilated. - Right atrium: The atrium was mildly dilated. - Tricuspid valve: There was moderate regurgitation.   Latest Cath   Medications:     Scheduled Medications: . amiodarone  200 mg Oral BID  . antiseptic oral rinse  7 mL Mouth Rinse BID  . apixaban  2.5 mg Oral BID  . calcium-vitamin D  1 tablet Oral Q breakfast  . docusate sodium  100 mg Oral Daily  . ferrous sulfate  325 mg Oral BID WC  . furosemide  80 mg Oral Daily  . hydrALAZINE  10 mg Oral 3 times per day  . insulin aspart  0-9 Units Subcutaneous TID WC  . insulin glargine  10 Units Subcutaneous QHS  . isosorbide mononitrate  15 mg Oral Daily  . linagliptin  5 mg Oral Daily  . potassium chloride  40 mEq Oral Daily  . sodium chloride  10-40 mL Intracatheter Q12H  . sodium chloride  10-40 mL Intracatheter Q12H    Infusions: . sodium chloride Stopped (03/19/15 0800)  . milrinone 0.25 mcg/kg/min (03/29/15 2144)    PRN Medications: acetaminophen, [DISCONTINUED] ondansetron **OR** ondansetron (ZOFRAN) IV, ondansetron, oxyCODONE, sodium chloride, sodium chloride   Assessment/Plan   81 yo with history of bioprosthetic MVR/AVR, nonischemic cardiomyopathy (EF 20-25% with LBBB), and CKD was admitted with acute on chronic systolic CHF with low output/volume overloaded state. AKI, lactate elevated. Initially hyperkalemic with altered mental status.  1. Acute on chronic systolic CHF -> cardiogenic shock: Nonischemic  cardiomyopathy with chronic LBBB. EF 20-25%. She has struggled recently with volume overload. St Jude CRT-D placed 6/1. Held milrinone 0.125 for co-ox trial 03/26/15.  Showed slight improvement of co-ox but became symptomatically worse and co-ox gradually worsened.  2. AKI on CKD: Suspect cardiorenal syndrome. 2.27 > 2.24 > 2.29 > 2.31 Today. 3. ID: Treated for UTI with ceftriaxone (E coli).  4. Atrial fibrillation: Paroxysmal. Has history of post-op afib. NSR with frequent PVCs and multiple short runs atrial fibrillation on milrinone, consistently in NSR on amiodarone. Now on Eliquis and amio 5. Bioprosthetic MV/AoV: Stable on last echo.  6. Fractured left ankle: cast off 6/3, needs PT.  7. Plan for SNF on home inotropes at discharge.   Co-ox improved to 65.9.  Creatinine stable. K down on daily diuretic, increase K supp.  Code status changed to DNR yesterday.  Dispo: To inotrope capable SNF when stable. Waiting to hear from [facility] for possible d/c today.   Length of Stay: [phone] Graciella Freer PA-C 03/30/2015, 7:06 AM  Advanced Heart Failure Team Pager [phone] (M-F; 7a - 4p)  Please contact [facility]  for night-coverage after hours (4p -7a ) and weekends on [email or web address]   Patient seen and examined with Otilio Saber, PA-C. We discussed all aspects of the encounter. I agree with the assessment and plan as stated above.   Improved on milrinone. Volume status better. Would supp K and agree with transfer to [name] today on milrinone. Would d/c on lasix 60 bid with prn metolazone 2.5 mg for weight gain 3 pounds or more.  See in HF Clinic next week. Follow [facility]ET.    Dantre Yearwood,MD 9:45 AM

## 2015-03-30 NOTE — Discharge Summary (Signed)
Advanced Heart Failure Team  Discharge Summary   Patient ID: Joanna [name] MRN: [id number], DOB/AGE: 27-Sep-1930 81 y.o. Admit date: 03/16/2015 D/C date:     03/30/2015   Primary Discharge Diagnoses:  1. Acute on chronic systolic CHF -> cardiogenic shock: Nonischemic cardiomyopathy with chronic LBBB. EF 20-25%.St Jude CRT-D placed 6/1. Will be d/c on milrinone  2. AKI on CKD: Suspect cardiorenal syndrome.  3. UTI -Treated with ceftriaxone (E coli).  4. Atrial fibrillation: Paroxysmal. - Now on Eliquis and amio 5. Bioprosthetic MV/AoV: Stable on last echo.  6. Fractured left ankle: cast off 6/3, needs PT.   Hospital Course:   Joanna [name] is a 81 year old woman with aortic stenosis s/p AVR/MVR (06/15/2013), systolic HF due to NICM (4/16 ECHO EF 20-25%), hypertension, diabetes mellitus type II, hyperlipidemia, and peripheral artery disease. Underwent AVR/MVR with placement of epicardial lead on 06/15/13 with PAF post-op terminated with amiodarone. She is off amiodarone now due to nausea.  She [facility] with an episode of nausea/vomiting, poor oral intake, chronic SOB. She was found to be fluid overloaded (145 lbs) with acute on chronic kidney failure and lactic acidosis. She was felt to be in cardiogenic shock. She was admitted to ICU. She was started on milrinone drip.    Attempts to wean patient off her milrinone drip were unsuccessful with return of symptoms and lethargy.  Decision was made to d/c her on home milrinone, to a inotrope capable SNF.  Her Urine output was a bit sluggish at times, with 7 Ls out this admit but a net change of only - 2.5 L.     She will be discharged today to [facility] on 0.25 of milrinone. Milrinone to be provided by [facility]. Pt will go via ambulance  While in the hospital we had multiple discussions with her and her family about her prognosis. Palliative Care team was also involved. She decided on DNR/DNI status.   Will have hospital follow up  with HF clinic 04/06/15 at 1200    Discharge Weight: 132 lb Discharge Vitals: Blood pressure 112/69, pulse 81, temperature 97.9 F (36.6 C), temperature source Oral, resp. rate 21, height  (1.626 m), weight 132 lb 11.5 oz (60.2 kg), SpO2 97 %.  Labs: Lab Results  Component Value Date   WBC 9.4 03/30/2015   HGB 10.1* 03/30/2015   HCT 31.7* 03/30/2015   MCV 93.8 03/30/2015   PLT 226 03/30/2015     Recent Labs Lab 03/30/15 0500  NA 135  K 3.2*  CL 95*  CO2 30  BUN 50*  CREATININE 2.31*  CALCIUM 9.1  GLUCOSE 118*   Lab Results  Component Value Date   CHOL 169 12/25/2014   HDL 54.70 12/25/2014   LDLCALC 93 12/25/2014   TRIG 108.0 12/25/2014   BNP (last 3 results)  Recent Labs  02/26/15 1245 03/13/15 1028 03/16/15 1619  BNP 4185.3* 4332.8* >4500.0*    ProBNP (last 3 results) No results for input(s): PROBNP in the last 8760 hours.   Diagnostic Studies/Procedures   No results found.  Discharge Medications     Medication List    STOP taking these medications        aspirin EC 81 MG tablet     simvastatin 20 MG tablet  Commonly known as:  ZOCOR      TAKE these medications        acetaminophen 325 MG tablet  Commonly known as:  TYLENOL  Take 325 mg  by mouth every 6 (six) hours as needed for mild pain.     amiodarone 200 MG tablet  Commonly known as:  PACERONE  Take 1 tablet (200 mg total) by mouth 2 (two) times daily.     apixaban 2.5 MG Tabs tablet  Commonly known as:  ELIQUIS  Take 1 tablet (2.5 mg total) by mouth 2 (two) times daily.     calcium citrate-vitamin D 315-200 MG-UNIT per tablet  Commonly known as:  CITRACAL+D  Take 1 tablet by mouth daily.     docusate sodium 100 MG capsule  Commonly known as:  COLACE  Take 100 mg by mouth 2 (two) times daily.     ferrous sulfate 325 (65 FE) MG tablet  Take 1 tablet (325 mg total) by mouth 2 (two) times daily with a meal.     furosemide 40 MG tablet  Commonly known as:  LASIX  Take  1.5 tablets (60 mg total) by mouth 2 (two) times daily.     hydrALAZINE 10 MG tablet  Commonly known as:  APRESOLINE  Take 1 tablet (10 mg total) by mouth every 8 (eight) hours.     HYDROcodone-acetaminophen 5-325 MG per tablet  Commonly known as:  NORCO/VICODIN  Take 1-2 tablets by mouth every 4 (four) hours as needed (breakthrough pain).     insulin aspart 100 UNIT/ML FlexPen  Commonly known as:  NOVOLOG  Inject 3 Units into the skin 3 (three) times daily with meals.     Insulin Glargine 300 UNIT/ML Sopn  Commonly known as:  TOUJEO SOLOSTAR  Inject 20 Units into the skin daily.     Insulin Pen Needle 32G X 6 MM Misc  Commonly known as:  NOVOFINE  Use daily with insulin     isosorbide mononitrate 30 MG 24 hr tablet  Commonly known as:  IMDUR  Take 0.5 tablets (15 mg total) by mouth daily.     JANUVIA 50 MG tablet  Generic drug:  sitaGLIPtin  Take 50 mg by mouth daily.     metolazone 5 MG tablet  Commonly known as:  ZAROXOLYN  Take 1 tablet (5 mg total) by mouth See admin instructions. Take as needed for 3 lb weight gain over discharge weight of 132 lbs. Give prior to Lasix.     milrinone 20 MG/100ML Soln infusion  Commonly known as:  PRIMACOR  Inject 15.475 mcg/min into the vein continuous.     ondansetron 4 MG tablet  Commonly known as:  ZOFRAN  Take 4 mg by mouth every 4 (four) hours as needed for nausea or vomiting.     potassium chloride SA 20 MEQ tablet  Commonly known as:  K-DUR,KLOR-CON  Take 1 tablet (20 mEq total) by mouth 2 (two) times daily.     promethazine 25 MG tablet  Commonly known as:  PHENERGAN  Take 25 mg by mouth every 12 (twelve) hours as needed for nausea or vomiting.        Disposition   The patient will be discharged in stable condition to [facility].  Discharge Instructions    AMB Referral to [facility] Care Management    Complete by:  As directed   Please assign patient to [facility] LCSW. Admit x3 in 6 months. Hx of CHF. Likely discharge today  03/30/15 to [facility] for short term. Ongoing palliative discussions should be included with follow up. Palliative consult was done during hospitalization. Consents were obtained. Please assign to [facility] RNCM for when patient goes home for CHF  disease management. Thanks. Raiford Noble, MSN-Ed, [facility] Liaison-[phone]  Reason for consult:  Please assign to [facility] LCSW initially for follow up at [facility] and [facility] RNCM upon return to home.  Diagnoses of:  Heart Failure  Expected date of contact:  1-3 days (reserved for hospital discharges)     Contraindication to ACEI at discharge    Complete by:  As directed      Diet - low sodium heart healthy    Complete by:  As directed      Heart Failure patients record your daily weight using the same scale at the same time of day    Complete by:  As directed      Increase activity slowly    Complete by:  As directed           Follow-up Information    Follow up with [facility] On 04/06/2015.   Specialty:  Cardiology   Why:  At 1200. Please bring all of your medications. Code for [facility] Patient Parking is 0800.   Contact information:   [address] [id number] mc [place] [address] [phone]        Duration of Discharge Encounter: Greater than 35 minutes   Signed, Luane School 03/30/2015, 12:42 PM  Patient seen and examined with Otilio Saber, PA-C. We discussed all aspects of the encounter. I agree with the assessment and plan as stated above.   She is ok for d/c on milrinone for palliation of HF symptoms. She in now DNR/DNI. Watch volume status closely.   Markee Matera,MD 8:48 AM

## 2015-03-30 NOTE — Progress Notes (Signed)
Patient chooses [facility] SNF- bed confirmed for today  Pt daughter to complete paperwork with facility at 2pm  CSW will continue to follow  Merlyn Lot, [facility] Clinical Social Worker [phone]

## 2015-03-30 NOTE — Consult Note (Signed)
   [facility] CM Inpatient Consult   03/30/2015  LAMONA MCCULLY 05-Jul-1930 [id number]   Patient evaluated for [facility] Care Management services for number of admissions and CHF diagnosis. Came to visit patient at bedside to offer and explain [facility] Care Management services. Noted patient to go to SNF. However, she reports she will be at SNF short term. Patient agreeable and consents signed. [facility] Licensed CSW will follow up with patient at SNF and once patient returns home [facility] RNCM will be requested to be assigned. Spoke with both inpatient RNCM and inpatient Licensed CSW. Patient likely to discharge to [facility] today.  Raiford Noble, MSN-Ed, RN,BSN [facility] Liaison [phone]

## 2015-03-30 NOTE — Progress Notes (Signed)
Transport requested from [facility]. Report called to RN at [facility]. Daughter, [name], aware per report.

## 2015-03-31 ENCOUNTER — Encounter (HOSPITAL_COMMUNITY): Payer: Self-pay | Admitting: *Deleted

## 2015-03-31 ENCOUNTER — Emergency Department (HOSPITAL_COMMUNITY): Payer: Medicare Other

## 2015-03-31 ENCOUNTER — Inpatient Hospital Stay (HOSPITAL_COMMUNITY)
Admission: EM | Admit: 2015-03-31 | Discharge: 2015-04-09 | DRG: 871 | Disposition: A | Discharge status: Skilled Nursing Facility | Payer: Medicare Other | Attending: Internal Medicine | Admitting: Internal Medicine

## 2015-03-31 DIAGNOSIS — I10 Essential (primary) hypertension: Secondary | ICD-10-CM

## 2015-03-31 DIAGNOSIS — Z7901 Long term (current) use of anticoagulants: Secondary | ICD-10-CM

## 2015-03-31 DIAGNOSIS — I5043 Acute on chronic combined systolic (congestive) and diastolic (congestive) heart failure: Secondary | ICD-10-CM | POA: Diagnosis not present

## 2015-03-31 DIAGNOSIS — A419 Sepsis, unspecified organism: Secondary | ICD-10-CM | POA: Diagnosis present

## 2015-03-31 DIAGNOSIS — Z953 Presence of xenogenic heart valve: Secondary | ICD-10-CM

## 2015-03-31 DIAGNOSIS — Z452 Encounter for adjustment and management of vascular access device: Secondary | ICD-10-CM

## 2015-03-31 DIAGNOSIS — I429 Cardiomyopathy, unspecified: Secondary | ICD-10-CM | POA: Diagnosis present

## 2015-03-31 DIAGNOSIS — I5022 Chronic systolic (congestive) heart failure: Secondary | ICD-10-CM

## 2015-03-31 DIAGNOSIS — I248 Other forms of acute ischemic heart disease: Secondary | ICD-10-CM | POA: Diagnosis present

## 2015-03-31 DIAGNOSIS — Z66 Do not resuscitate: Secondary | ICD-10-CM | POA: Diagnosis present

## 2015-03-31 DIAGNOSIS — Z515 Encounter for palliative care: Secondary | ICD-10-CM

## 2015-03-31 DIAGNOSIS — I480 Paroxysmal atrial fibrillation: Secondary | ICD-10-CM | POA: Diagnosis present

## 2015-03-31 DIAGNOSIS — I428 Other cardiomyopathies: Secondary | ICD-10-CM

## 2015-03-31 DIAGNOSIS — E162 Hypoglycemia, unspecified: Secondary | ICD-10-CM | POA: Diagnosis present

## 2015-03-31 DIAGNOSIS — I071 Rheumatic tricuspid insufficiency: Secondary | ICD-10-CM | POA: Diagnosis present

## 2015-03-31 DIAGNOSIS — E785 Hyperlipidemia, unspecified: Secondary | ICD-10-CM | POA: Diagnosis present

## 2015-03-31 DIAGNOSIS — J189 Pneumonia, unspecified organism: Secondary | ICD-10-CM | POA: Diagnosis present

## 2015-03-31 DIAGNOSIS — I959 Hypotension, unspecified: Secondary | ICD-10-CM

## 2015-03-31 DIAGNOSIS — I130 Hypertensive heart and chronic kidney disease with heart failure and stage 1 through stage 4 chronic kidney disease, or unspecified chronic kidney disease: Secondary | ICD-10-CM | POA: Diagnosis present

## 2015-03-31 DIAGNOSIS — Z8673 Personal history of transient ischemic attack (TIA), and cerebral infarction without residual deficits: Secondary | ICD-10-CM | POA: Diagnosis not present

## 2015-03-31 DIAGNOSIS — I447 Left bundle-branch block, unspecified: Secondary | ICD-10-CM | POA: Diagnosis present

## 2015-03-31 DIAGNOSIS — Z950 Presence of cardiac pacemaker: Secondary | ICD-10-CM

## 2015-03-31 DIAGNOSIS — I272 Other secondary pulmonary hypertension: Secondary | ICD-10-CM | POA: Diagnosis present

## 2015-03-31 DIAGNOSIS — E440 Moderate protein-calorie malnutrition: Secondary | ICD-10-CM

## 2015-03-31 DIAGNOSIS — N184 Chronic kidney disease, stage 4 (severe): Secondary | ICD-10-CM

## 2015-03-31 DIAGNOSIS — J9601 Acute respiratory failure with hypoxia: Secondary | ICD-10-CM | POA: Diagnosis present

## 2015-03-31 DIAGNOSIS — D72829 Elevated white blood cell count, unspecified: Secondary | ICD-10-CM

## 2015-03-31 DIAGNOSIS — E1129 Type 2 diabetes mellitus with other diabetic kidney complication: Secondary | ICD-10-CM | POA: Diagnosis present

## 2015-03-31 DIAGNOSIS — N183 Chronic kidney disease, stage 3 unspecified: Secondary | ICD-10-CM

## 2015-03-31 DIAGNOSIS — Y95 Nosocomial condition: Secondary | ICD-10-CM | POA: Diagnosis present

## 2015-03-31 DIAGNOSIS — E11649 Type 2 diabetes mellitus with hypoglycemia without coma: Secondary | ICD-10-CM | POA: Diagnosis present

## 2015-03-31 DIAGNOSIS — I5042 Chronic combined systolic (congestive) and diastolic (congestive) heart failure: Secondary | ICD-10-CM | POA: Diagnosis present

## 2015-03-31 DIAGNOSIS — I4892 Unspecified atrial flutter: Secondary | ICD-10-CM | POA: Diagnosis present

## 2015-03-31 DIAGNOSIS — D509 Iron deficiency anemia, unspecified: Secondary | ICD-10-CM | POA: Diagnosis present

## 2015-03-31 DIAGNOSIS — Z794 Long term (current) use of insulin: Secondary | ICD-10-CM

## 2015-03-31 DIAGNOSIS — R7989 Other specified abnormal findings of blood chemistry: Secondary | ICD-10-CM

## 2015-03-31 DIAGNOSIS — E876 Hypokalemia: Secondary | ICD-10-CM

## 2015-03-31 DIAGNOSIS — I5023 Acute on chronic systolic (congestive) heart failure: Secondary | ICD-10-CM | POA: Insufficient documentation

## 2015-03-31 DIAGNOSIS — F329 Major depressive disorder, single episode, unspecified: Secondary | ICD-10-CM | POA: Diagnosis present

## 2015-03-31 DIAGNOSIS — I739 Peripheral vascular disease, unspecified: Secondary | ICD-10-CM | POA: Diagnosis present

## 2015-03-31 DIAGNOSIS — D638 Anemia in other chronic diseases classified elsewhere: Secondary | ICD-10-CM | POA: Diagnosis present

## 2015-03-31 DIAGNOSIS — R778 Other specified abnormalities of plasma proteins: Secondary | ICD-10-CM | POA: Diagnosis present

## 2015-03-31 DIAGNOSIS — J90 Pleural effusion, not elsewhere classified: Secondary | ICD-10-CM

## 2015-03-31 LAB — CARBOXYHEMOGLOBIN
Carboxyhemoglobin: 2 % — ABNORMAL HIGH (ref 0.5–1.5)
METHEMOGLOBIN: 0.9 % (ref 0.0–1.5)
O2 Saturation: 98.6 %
TOTAL HEMOGLOBIN: 11.1 g/dL — AB (ref 12.0–16.0)

## 2015-03-31 LAB — URINALYSIS, ROUTINE W REFLEX MICROSCOPIC
Bilirubin Urine: NEGATIVE
Glucose, UA: NEGATIVE mg/dL
HGB URINE DIPSTICK: NEGATIVE
KETONES UR: NEGATIVE mg/dL
Leukocytes, UA: NEGATIVE
Nitrite: NEGATIVE
Protein, ur: NEGATIVE mg/dL
SPECIFIC GRAVITY, URINE: 1.015 (ref 1.005–1.030)
UROBILINOGEN UA: 0.2 mg/dL (ref 0.0–1.0)
pH: 5 (ref 5.0–8.0)

## 2015-03-31 LAB — BLOOD GAS, ARTERIAL
Acid-Base Excess: 3.4 mmol/L — ABNORMAL HIGH (ref 0.0–2.0)
Bicarbonate: 27.1 mEq/L — ABNORMAL HIGH (ref 20.0–24.0)
O2 Content: 3 L/min
O2 Saturation: 97.7 %
PATIENT TEMPERATURE: 98.6
PCO2 ART: 39 mmHg (ref 35.0–45.0)
TCO2: 28.3 mmol/L (ref 0–100)
pH, Arterial: 7.456 — ABNORMAL HIGH (ref 7.350–7.450)
pO2, Arterial: 97.4 mmHg (ref 80.0–100.0)

## 2015-03-31 LAB — COMPREHENSIVE METABOLIC PANEL
ALT: 27 U/L (ref 14–54)
AST: 44 U/L — AB (ref 15–41)
Albumin: 3.2 g/dL — ABNORMAL LOW (ref 3.5–5.0)
Alkaline Phosphatase: 72 U/L (ref 38–126)
Anion gap: 13 (ref 5–15)
BILIRUBIN TOTAL: 1 mg/dL (ref 0.3–1.2)
BUN: 55 mg/dL — AB (ref 6–20)
CHLORIDE: 94 mmol/L — AB (ref 101–111)
CO2: 29 mmol/L (ref 22–32)
Calcium: 9.2 mg/dL (ref 8.9–10.3)
Creatinine, Ser: 2.52 mg/dL — ABNORMAL HIGH (ref 0.44–1.00)
GFR calc Af Amer: 19 mL/min — ABNORMAL LOW (ref >60)
GFR calc non Af Amer: 17 mL/min — ABNORMAL LOW (ref >60)
GLUCOSE: 148 mg/dL — AB (ref 65–99)
POTASSIUM: 3.2 mmol/L — AB (ref 3.5–5.1)
SODIUM: 136 mmol/L (ref 135–145)
Total Protein: 6.3 g/dL — ABNORMAL LOW (ref 6.5–8.1)

## 2015-03-31 LAB — CBG MONITORING, ED
GLUCOSE-CAPILLARY: 299 mg/dL — AB (ref 65–99)
Glucose-Capillary: 175 mg/dL — ABNORMAL HIGH (ref 65–99)
Glucose-Capillary: 248 mg/dL — ABNORMAL HIGH (ref 65–99)
Glucose-Capillary: 186 mg/dL — ABNORMAL HIGH (ref 65–99)
Glucose-Capillary: 197 mg/dL — ABNORMAL HIGH (ref 65–99)

## 2015-03-31 LAB — BRAIN NATRIURETIC PEPTIDE: B Natriuretic Peptide: 3300.6 pg/mL — ABNORMAL HIGH (ref 0.0–100.0)

## 2015-03-31 LAB — LACTIC ACID, PLASMA
Lactic Acid, Venous: 1 mmol/L (ref 0.5–2.0)
Lactic Acid, Venous: 1.7 mmol/L (ref 0.5–2.0)

## 2015-03-31 LAB — CBC WITH DIFFERENTIAL/PLATELET
Basophils Absolute: 0 10*3/uL (ref 0.0–0.1)
Basophils Relative: 0 % (ref 0–1)
Eosinophils Absolute: 0.1 10*3/uL (ref 0.0–0.7)
Eosinophils Relative: 0 % (ref 0–5)
HCT: 35.2 % — ABNORMAL LOW (ref 36.0–46.0)
Hemoglobin: 11.4 g/dL — ABNORMAL LOW (ref 12.0–15.0)
Lymphocytes Relative: 5 % — ABNORMAL LOW (ref 12–46)
Lymphs Abs: 1.2 10*3/uL (ref 0.7–4.0)
MCH: 30.7 pg (ref 26.0–34.0)
MCHC: 32.4 g/dL (ref 30.0–36.0)
MCV: 94.9 fL (ref 78.0–100.0)
Monocytes Absolute: 1.4 10*3/uL — ABNORMAL HIGH (ref 0.1–1.0)
Monocytes Relative: 6 % (ref 3–12)
Neutro Abs: 21.9 10*3/uL — ABNORMAL HIGH (ref 1.7–7.7)
Neutrophils Relative %: 89 % — ABNORMAL HIGH (ref 43–77)
Platelets: 267 10*3/uL (ref 150–400)
RBC: 3.71 MIL/uL — ABNORMAL LOW (ref 3.87–5.11)
RDW: 15.9 % — ABNORMAL HIGH (ref 11.5–15.5)
WBC: 24.6 10*3/uL — ABNORMAL HIGH (ref 4.0–10.5)

## 2015-03-31 LAB — GLUCOSE, CAPILLARY
GLUCOSE-CAPILLARY: 210 mg/dL — AB (ref 65–99)
Glucose-Capillary: 258 mg/dL — ABNORMAL HIGH (ref 65–99)
Glucose-Capillary: 213 mg/dL — ABNORMAL HIGH (ref 65–99)
Glucose-Capillary: 158 mg/dL — ABNORMAL HIGH (ref 65–99)

## 2015-03-31 LAB — TROPONIN I: Troponin I: 0.06 ng/mL — ABNORMAL HIGH (ref <0.031)

## 2015-03-31 LAB — PROCALCITONIN: PROCALCITONIN: 1.75 ng/mL

## 2015-03-31 LAB — I-STAT CG4 LACTIC ACID, ED: Lactic Acid, Venous: 0.77 mmol/L (ref 0.5–2.0)

## 2015-03-31 LAB — STREP PNEUMONIAE URINARY ANTIGEN: Strep Pneumo Urinary Antigen: NEGATIVE

## 2015-03-31 MED ORDER — APIXABAN 2.5 MG PO TABS
2.5000 mg | ORAL_TABLET | Freq: Two times a day (BID) | ORAL | Status: DC
  Administered 2015-03-31 – 2015-04-09 (×19): 2.5 mg via ORAL
  Filled 2015-03-31 (×21): qty 1

## 2015-03-31 MED ORDER — MILRINONE IN DEXTROSE 20 MG/100ML IV SOLN
0.2500 ug/kg/min | INTRAVENOUS | Status: DC
  Administered 2015-03-31 – 2015-04-09 (×11): 0.25 ug/kg/min via INTRAVENOUS
  Filled 2015-03-31 (×11): qty 100

## 2015-03-31 MED ORDER — POTASSIUM CHLORIDE CRYS ER 20 MEQ PO TBCR
40.0000 meq | EXTENDED_RELEASE_TABLET | Freq: Once | ORAL | Status: AC
  Administered 2015-03-31: 40 meq via ORAL
  Filled 2015-03-31: qty 2

## 2015-03-31 MED ORDER — POTASSIUM CHLORIDE CRYS ER 20 MEQ PO TBCR
20.0000 meq | EXTENDED_RELEASE_TABLET | Freq: Two times a day (BID) | ORAL | Status: DC
  Administered 2015-03-31 – 2015-04-02 (×5): 20 meq via ORAL
  Filled 2015-03-31 (×6): qty 1

## 2015-03-31 MED ORDER — INSULIN ASPART 100 UNIT/ML ~~LOC~~ SOLN
0.0000 [IU] | SUBCUTANEOUS | Status: DC
Start: 2015-03-31 — End: 2015-04-01
  Administered 2015-03-31: 3 [IU] via SUBCUTANEOUS
  Administered 2015-03-31: 2 [IU] via SUBCUTANEOUS
  Administered 2015-03-31 (×2): 3 [IU] via SUBCUTANEOUS
  Administered 2015-03-31 – 2015-04-01 (×2): 2 [IU] via SUBCUTANEOUS
  Filled 2015-03-31 (×3): qty 1

## 2015-03-31 MED ORDER — PIPERACILLIN-TAZOBACTAM IN DEX 2-0.25 GM/50ML IV SOLN
2.2500 g | Freq: Three times a day (TID) | INTRAVENOUS | Status: DC
  Administered 2015-03-31 – 2015-04-06 (×17): 2.25 g via INTRAVENOUS
  Filled 2015-03-31 (×21): qty 50

## 2015-03-31 MED ORDER — FUROSEMIDE 40 MG PO TABS
40.0000 mg | ORAL_TABLET | Freq: Two times a day (BID) | ORAL | Status: DC
  Administered 2015-03-31 – 2015-04-04 (×10): 40 mg via ORAL
  Filled 2015-03-31 (×7): qty 1
  Filled 2015-03-31: qty 2
  Filled 2015-03-31 (×5): qty 1

## 2015-03-31 MED ORDER — MILRINONE IN DEXTROSE 20 MG/100ML IV SOLN: 0.2500 ug/kg/min | INTRAVENOUS | Status: DC

## 2015-03-31 MED ORDER — VANCOMYCIN HCL IN DEXTROSE 1-5 GM/200ML-% IV SOLN
1000.0000 mg | Freq: Once | INTRAVENOUS | Status: AC
  Administered 2015-03-31: 1000 mg via INTRAVENOUS
  Filled 2015-03-31: qty 200

## 2015-03-31 MED ORDER — PIPERACILLIN-TAZOBACTAM 3.375 G IVPB 30 MIN
3.3750 g | Freq: Once | INTRAVENOUS | Status: AC
  Administered 2015-03-31: 3.375 g via INTRAVENOUS

## 2015-03-31 MED ORDER — VANCOMYCIN HCL IN DEXTROSE 1-5 GM/200ML-% IV SOLN
1000.0000 mg | INTRAVENOUS | Status: DC
  Administered 2015-04-03: 1000 mg via INTRAVENOUS
  Filled 2015-03-31: qty 200

## 2015-03-31 MED ORDER — IMIPENEM-CILASTATIN 250 MG IV SOLR
250.0000 mg | Freq: Two times a day (BID) | INTRAVENOUS | Status: DC
  Filled 2015-03-31 (×2): qty 250

## 2015-03-31 MED ORDER — DOCUSATE SODIUM 100 MG PO CAPS
100.0000 mg | ORAL_CAPSULE | Freq: Two times a day (BID) | ORAL | Status: DC
  Administered 2015-03-31 – 2015-04-09 (×13): 100 mg via ORAL
  Filled 2015-03-31 (×20): qty 1

## 2015-03-31 NOTE — Consult Note (Signed)
Consultation Note Date: 03/31/2015   Patient Name: Joanna [name]  DOB: 10/01/1930  MRN: [id number]  Age / Sex: 81 y.o., female   PCP: Etta Grandchild, MD Referring Physician: Alison Murray, MD  Reason for Consultation: Establishing goals of care  Palliative Care Assessment and Plan Summary of Established Goals of Care and Medical Treatment Preferences   Clinical Assessment/Narrative: 81 yo female with biprosthetic MVR/RVR, CKD, NICM w/LBBB s/p CRT. Admitted 5/27 with CHF exacerbation leading to CRT with BiV pacer/ICD placed. Re-admitted 1 day after discharge with hypoglycemia and concern for PNA/sepsis.   Contacts/Participants in Discussion: Primary Decision Maker: Patient   HCPOA: no    Sherrian known to me from being in hospital just yesterday.  During that admission, ACP discussions held both by myself and by heart failure team. She was started on milrinone drip during that admission and had CRT.  I talked with both her and her family about the concerns related to being on continuous milrinone and prognostically what this may mean related to her CHF and comorbid diseases.  At this point will need to see how she does.  She already sounds like she is doing much better with correction of her hypoglycemia and looks at least physically close to when I saw her Thursday.  Suspect she will return to SNF in coming days as long as there are no further setbacks.  I attempted to reach her daughter [name] but no answer. I did leave her a message and encouraged her to call me if additional questions/concerns arise.    Code Status/Advance Care Planning:  DNR  Symptom Management:   Asymptomatic currently.    Psycho-social/Spiritual:   Support System: daughter [name] and Bunnie Domino  Desire for further Chaplaincy support:no  Prognosis: < 6 months  Discharge Planning:  Skilled Nursing Facility for rehab with Palliative care service follow-up       Chief Complaint: Hypoglycemia  History  of Present Illness:  81 yo female with PMHx of Biprosthetic MVR/RVR, NICM with LBBB, CKD recently admitted with acute on chronic systolic CHF, AMS, AKI, HyperK c/w cardiogenic shock. Had BiV pacer placed during that admission as well as starting on milrinone infusion.  She was just discharged yesterday to [facility] but returned when she was noted to have nausea/diaphoesis in setting of blood glucose in the 20's.  She only remembers having vision troubles and feeling weak at the time.  She did have leukocytocis and CXR findings concerning for pulm edema vs PNA.  She has been started on IV abx and at the time of my visit this afternoon, is already feeling back to her baseline from yesterday.  She states she has no complaints and hopes to go back to SNF soon to work on rehab. She left her walking boot at SNF and requests that her daughter bring it to the hospital so she can continue to ambulate here. She has walking boot as result of recent R ankle fracture.    Primary Diagnoses  Present on Admission:  . Essential hypertension . Pulmonary HTN . Acute on chronic combined systolic and diastolic congestive heart failure, NYHA class 4 . PAF (paroxysmal atrial fibrillation) . (Resolved) CKD (chronic kidney disease) stage 3, GFR 30-59 ml/min . Hyperlipidemia with target LDL less than 100 . Diabetes mellitus with hypoglycemia . Acute respiratory failure with hypoxia . HCAP (healthcare-associated pneumonia) . Pleural effusion, left . Leukocytosis . (Resolved) Sepsis associated hypotension . (Resolved) Iron deficiency anemia . Malnutrition of moderate degree . CKD (  chronic kidney disease) stage 4, GFR 15-29 ml/min . Anemia of chronic disease . Hypokalemia . Troponin level elevated . Sepsis due to pneumonia  I have reviewed the medical record, interviewed the patient and family, and examined the patient. The following aspects are pertinent.  Past Medical History  Diagnosis Date  . Peripheral artery  disease   . Hyperlipidemia        . Arthritis   . Aortic stenosis      a. s/p AVR  . BUNDLE BRANCH BLOCK, LEFT    . CAROTID ARTERY STENOSIS 01/05/2009  . CVA 03/14/2010  . PERIPHERAL VASCULAR DISEASE 07/30/2007  . Chronic combined systolic and diastolic CHF (congestive heart failure)     a. RHC 09/2011 RA 8, RV 58/2/11; PA 54/22 (37) PCWP 20; F CO/CI 4.57/2.67 PVR 3.7; b. L main no sig dz, LAD luminal irreg; LCx lg Ramus with luminal irreg, small AV Lcx witout sig dz, RCA luminal irreg; severe mitral annular calcifications; AV calcified c. EF 45-50% (07/2013);  d. 11/2014 Echo: EF 20-25%, diff HK, nl AV/MV, sev dil LA, mod TR/PR, PASP .  . Diabetes mellitus   . HTN (hypertension)   . Iron deficiency anemia 09/21/2011  . History of shingles   . Mitral regurgitation     a. s/p MVR  . S/P aortic valve replacement with bioprosthetic valve 06/15/2013    21 mm [facility] Ease bovine pericardial tissue valve  . S/P mitral valve replacement with bioprosthetic valve 06/15/2013    25 mm [facility] Mitral bovine pericardial tissue valve  . Fracture, fibula, shaft 01/30/2015    right   History   Social History  . Marital Status: Divorced    Spouse Name: N/A  . Number of Children: N/A  . Years of Education: N/A   Social History Main Topics  . Smoking status: Never Smoker   . Smokeless tobacco: Never Used  . Alcohol Use: No  . Drug Use: No  . Sexual Activity: No   Other Topics Concern  . None   Social History Narrative   Patient lives alone here in town   She continues to work, helping to bind and oversew books   She is divorced after 23 years of marriage   1 son- '61, minister; 1 dtr '55; 4 grandchildren   End of life care-provided packet on living well   Family History  Problem Relation Age of Onset  . Colon cancer Mother   . Diabetes Neg Hx   . Coronary artery disease Neg Hx    Scheduled Meds: . apixaban  2.5 mg Oral BID  . docusate sodium  100 mg Oral BID  .  furosemide  40 mg Oral BID  . insulin aspart  0-9 Units Subcutaneous 6 times per day  . piperacillin-tazobactam (ZOSYN)  IV  2.25 g Intravenous Q8H  . potassium chloride SA  20 mEq Oral BID  . [START ON 04/03/2015] vancomycin  1,000 mg Intravenous Q72H   Continuous Infusions: . milrinone 0.25 mcg/kg/min (03/31/15 1357)   PRN Meds:. Medications Prior to Admission:  Prior to Admission medications   Medication Sig Start Date End Date Taking? Authorizing Provider  acetaminophen (TYLENOL) 325 MG tablet Take 325 mg by mouth every 6 (six) hours as needed for mild pain.   Yes Historical Provider, MD  amiodarone (PACERONE) 200 MG tablet Take 1 tablet (200 mg total) by mouth 2 (two) times daily. 03/30/15  Yes Graciella Freer, PA-C  apixaban (ELIQUIS) 2.5 MG TABS  tablet Take 1 tablet (2.5 mg total) by mouth 2 (two) times daily. 03/30/15  Yes Graciella Freer, PA-C  calcium citrate-vitamin D (CITRACAL+D) 315-200 MG-UNIT per tablet Take 1 tablet by mouth daily.   Yes Historical Provider, MD  docusate sodium (COLACE) 100 MG capsule Take 100 mg by mouth 2 (two) times daily.     Yes Historical Provider, MD  ferrous sulfate 325 (65 FE) MG tablet Take 1 tablet (325 mg total) by mouth 2 (two) times daily with a meal. 02/28/14  Yes Etta Grandchild, MD  furosemide (LASIX) 40 MG tablet Take 1.5 tablets (60 mg total) by mouth 2 (two) times daily. 03/30/15  Yes Graciella Freer, PA-C  hydrALAZINE (APRESOLINE) 10 MG tablet Take 1 tablet (10 mg total) by mouth every 8 (eight) hours. 03/30/15  Yes Graciella Freer, PA-C  HYDROcodone-acetaminophen (NORCO/VICODIN) 5-325 MG per tablet Take 1-2 tablets by mouth every 4 (four) hours as needed (breakthrough pain). 02/02/15  Yes Jerald Kief, MD  insulin aspart (NOVOLOG) 100 UNIT/ML FlexPen Inject 3 Units into the skin 3 (three) times daily with meals. 02/02/15  Yes Jerald Kief, MD  Insulin Glargine (TOUJEO SOLOSTAR) 300 UNIT/ML SOPN Inject 20 Units into  the skin daily. 02/02/15  Yes Jerald Kief, MD  Insulin Pen Needle (NOVOFINE) 32G X 6 MM MISC Use daily with insulin 12/29/14  Yes Etta Grandchild, MD  isosorbide mononitrate (IMDUR) 30 MG 24 hr tablet Take 0.5 tablets (15 mg total) by mouth daily. 03/30/15  Yes Mariam Dollar Tillery, PA-C  JANUVIA 50 MG tablet Take 50 mg by mouth daily. 10/28/14  Yes Historical Provider, MD  metolazone (ZAROXOLYN) 5 MG tablet Take 1 tablet (5 mg total) by mouth See admin instructions. Take as needed for 3 lb weight gain over discharge weight of 132 lbs. Give prior to Lasix. 03/30/15  Yes Graciella Freer, PA-C  milrinone [facility]) 20 MG/100ML SOLN infusion Inject 15.475 mcg/min into the vein continuous. 03/30/15  Yes Mariam Dollar Tillery, PA-C  ondansetron (ZOFRAN) 4 MG tablet Take 4 mg by mouth every 4 (four) hours as needed for nausea or vomiting.   Yes Historical Provider, MD  potassium chloride SA (K-DUR,KLOR-CON) 20 MEQ tablet Take 1 tablet (20 mEq total) by mouth 2 (two) times daily. 03/30/15  Yes Graciella Freer, PA-C  promethazine (PHENERGAN) 25 MG tablet Take 25 mg by mouth every 12 (twelve) hours as needed for nausea or vomiting.   Yes Historical Provider, MD   Allergies  Allergen Reactions  . Other Hives    STEROIDS  . Prednisone Hives and Other (See Comments)    She is allergic to all steroids!   CBC:    Component Value Date/Time   WBC 24.6* 03/31/2015 0430   HGB 11.4* 03/31/2015 0430   HCT 35.2* 03/31/2015 0430   PLT 267 03/31/2015 0430   MCV 94.9 03/31/2015 0430   NEUTROABS 21.9* 03/31/2015 0430   LYMPHSABS 1.2 03/31/2015 0430   MONOABS 1.4* 03/31/2015 0430   EOSABS 0.1 03/31/2015 0430   BASOSABS 0.0 03/31/2015 0430   Comprehensive Metabolic Panel:    Component Value Date/Time   NA 136 03/31/2015 0430   K 3.2* 03/31/2015 0430   CL 94* 03/31/2015 0430   CO2 29 03/31/2015 0430   BUN 55* 03/31/2015 0430   CREATININE 2.52* 03/31/2015 0430   GLUCOSE 148* 03/31/2015 0430    CALCIUM 9.2 03/31/2015 0430   AST 44* 03/31/2015 0430   ALT 27 03/31/2015 0430  ALKPHOS 72 03/31/2015 0430   BILITOT 1.0 03/31/2015 0430   PROT 6.3* 03/31/2015 0430   ALBUMIN 3.2* 03/31/2015 0430    Physical Exam: Vital Signs: BP 95/65 mmHg  Pulse 90  Temp(Src) 97.7 F (36.5 C) (Oral)  Resp 15  Ht 5\' 4"  (1.626 m)  Wt 60.3 kg (132 lb 15 oz)  BMI 22.81 kg/m2  SpO2 100% SpO2: SpO2: 100 % O2 Device: O2 Device: Nasal Cannula O2 Flow Rate: O2 Flow Rate (L/min): 4 L/min Intake/output summary:  Intake/Output Summary (Last 24 hours) at 03/31/15 1706 Last data filed at 03/31/15 1600  Gross per 24 hour  Intake   13.5 ml  Output      0 ml  Net   13.5 ml   LBM: Last BM Date: 03/31/15 Baseline Weight: Weight: 60.3 kg (132 lb 15 oz) Most recent weight: Weight: 60.3 kg (132 lb 15 oz)  Exam Findings:  GEN: alert, NAD HEENT: [place], sclera anicteric CV: reg rate EXT: warm, no significant peripheral edema         Palliative Performance Scale: 50             Additional Data Reviewed: Recent Labs     03/30/15  0500  03/31/15  0430  WBC  9.4  24.6*  HGB  10.1*  11.4*  PLT  226  267  NA  135  136  BUN  50*  55*  CREATININE  2.31*  2.52*   6/11 CXR IMPRESSION: Stable cardiomegaly. Increasing pulmonary vascular congestion. Interstitial prominence concerning for pulmonary edema with increasing LEFT midlung zone consolidations/confluent edema versus subcutaneous hematoma from recent pacemaker placement. Moderate LEFT pleural effusion.  No apparent change in position of RIGHT PICC line.   Time Total: 40 minutes Greater than 50%  of this time was spent counseling and coordinating care related to the above assessment and plan.  Signed by: Leland Her, DO  Orvis Brill, DO  03/31/2015, 5:06 PM  Please contact Palliative Medicine Team phone at [phone] for questions and concerns.

## 2015-03-31 NOTE — ED Notes (Signed)
Called Junious Silk, NP about patients hypotension, currently 88/50.  She wants patients pressure to stay above 85 systolic.  If it drops lower than that then call again.  Will continue to monitor.

## 2015-03-31 NOTE — ED Notes (Signed)
Pt with recent pacemaker placement on 6/1; bruising noted to site. Staff at nursing facility called EMS for hypoglycemia, CBG 20. Pt was able to eat glucagon and drink water with sugar in it to raise glucose levels. Pt denies complaints on arrival. Milrinone continuous infusion to L upper arm PICC. Pt noted to be hypotensive.

## 2015-03-31 NOTE — ED Provider Notes (Signed)
CSN: [id number]     Arrival date & time 03/31/15  0350 History   First MD Initiated Contact with Patient 03/31/15 0414     Chief Complaint  Patient presents with  . Hypoglycemia     (Consider location/radiation/quality/duration/timing/severity/associated sxs/prior Treatment) HPI Patient with recent discharge after pacemaker placed. Patient states that earlier this evening she began getting lightheaded, diaphoretic and nauseated. Blood sugar was checked and it was in the 20s. Was given glucagon and sugar water with improvement of her symptoms. She currently is asymptomatic. She denies any chest pain. She has no shortness of breath. She is on milrinone drip.  Past Medical History  Diagnosis Date  . Peripheral artery disease   . Hyperlipidemia        . Arthritis   . Aortic stenosis      a. s/p AVR  . BUNDLE BRANCH BLOCK, LEFT    . CAROTID ARTERY STENOSIS 01/05/2009  . CVA 03/14/2010  . PERIPHERAL VASCULAR DISEASE 07/30/2007  . Chronic combined systolic and diastolic CHF (congestive heart failure)     a. RHC 09/2011 RA 8, RV 58/2/11; PA 54/22 (37) PCWP 20; F CO/CI 4.57/2.67 PVR 3.7; b. L main no sig dz, LAD luminal irreg; LCx lg Ramus with luminal irreg, small AV Lcx witout sig dz, RCA luminal irreg; severe mitral annular calcifications; AV calcified c. EF 45-50% (07/2013);  d. 11/2014 Echo: EF 20-25%, diff HK, nl AV/MV, sev dil LA, mod TR/PR, PASP .  . Diabetes mellitus   . HTN (hypertension)   . Iron deficiency anemia 09/21/2011  . History of shingles   . Mitral regurgitation     a. s/p MVR  . S/P aortic valve replacement with bioprosthetic valve 06/15/2013    21 mm [facility] Ease bovine pericardial tissue valve  . S/P mitral valve replacement with bioprosthetic valve 06/15/2013    25 mm [facility] Mitral bovine pericardial tissue valve  . Fracture, fibula, shaft 01/30/2015    right   Past Surgical History  Procedure Laterality Date  . Carotid endarterectomy Left 2011  .  Polypectomy  12/27/04  . Cataract extraction Bilateral 2009  . Tee without cardioversion N/A 05/19/2013    Procedure: TRANSESOPHAGEAL ECHOCARDIOGRAM (TEE);  Surgeon: Laurey Morale, MD;  Location: [facility] ENDOSCOPY;  Service: Cardiovascular;  Laterality: N/A;  . Colonoscopy    . Cardiac catheterization  2012  . Aortic valve replacement N/A 06/15/2013    Procedure: AORTIC VALVE REPLACEMENT (AVR);  Surgeon: Purcell Nails, MD;  Location: [facility] OR;  Service: Open Heart Surgery;  Laterality: N/A;  . Intraoperative transesophageal echocardiogram N/A 06/15/2013    Procedure: INTRAOPERATIVE TRANSESOPHAGEAL ECHOCARDIOGRAM;  Surgeon: Purcell Nails, MD;  Location: [facility] OR;  Service: Open Heart Surgery;  Laterality: N/A;  . Mitral valve replacement N/A 06/15/2013    Procedure: MITRAL VALVE (MV) REPLACEMENT;  Surgeon: Purcell Nails, MD;  Location: [facility] OR;  Service: Open Heart Surgery;  Laterality: N/A;  . Epicardial pacing lead placement N/A 06/15/2013    Procedure: EPICARDIAL PACING LEAD PLACEMENT;  Surgeon: Purcell Nails, MD;  Location: [facility] OR;  Service: Open Heart Surgery;  Laterality: N/A;  . Left and right heart catheterization with coronary angiogram N/A 09/19/2011    Procedure: LEFT AND RIGHT HEART CATHETERIZATION WITH CORONARY ANGIOGRAM;  Surgeon: Dolores Patty, MD;  Location: [facility] CATH LAB;  Service: Cardiovascular;  Laterality: N/A;  . Orif ankle fracture Right 01/31/2015    Procedure: OPEN REDUCTION INTERNAL FIXATION (ORIF) ANKLE FRACTURE;  Surgeon:  Eldred Manges, MD;  Location: [facility] OR;  Service: Orthopedics;  Laterality: Right;  . Ep implantable device N/A 03/21/2015    Procedure: BiV Pacemaker Insertion CRT-P;  Surgeon: Duke Salvia, MD;  Location: [facility] INVASIVE CV LAB;  Service: Cardiovascular;  Laterality: N/A;  . Pacemaker insertion     Family History  Problem Relation Age of Onset  . Colon cancer Mother   . Diabetes Neg Hx   . Coronary artery disease Neg Hx    History  Substance Use Topics  .  Smoking status: Never Smoker   . Smokeless tobacco: Never Used  . Alcohol Use: No   OB History    No data available     Review of Systems  Constitutional: Positive for diaphoresis. Negative for fever and chills.  Respiratory: Negative for cough and shortness of breath.   Cardiovascular: Negative for chest pain.  Gastrointestinal: Positive for nausea. Negative for vomiting, abdominal pain and diarrhea.  Musculoskeletal: Negative for myalgias, back pain, neck pain and neck stiffness.  Skin: Negative for rash and wound.  Neurological: Positive for dizziness and light-headedness. Negative for syncope, weakness, numbness and headaches.  All other systems reviewed and are negative.     Allergies  Other and Prednisone  Home Medications   Prior to Admission medications   Medication Sig Start Date End Date Taking? Authorizing Provider  acetaminophen (TYLENOL) 325 MG tablet Take 325 mg by mouth every 6 (six) hours as needed for mild pain.   Yes Historical Provider, MD  amiodarone (PACERONE) 200 MG tablet Take 1 tablet (200 mg total) by mouth 2 (two) times daily. 03/30/15  Yes Graciella Freer, PA-C  apixaban (ELIQUIS) 2.5 MG TABS tablet Take 1 tablet (2.5 mg total) by mouth 2 (two) times daily. 03/30/15  Yes Graciella Freer, PA-C  calcium citrate-vitamin D (CITRACAL+D) 315-200 MG-UNIT per tablet Take 1 tablet by mouth daily.   Yes Historical Provider, MD  docusate sodium (COLACE) 100 MG capsule Take 100 mg by mouth 2 (two) times daily.     Yes Historical Provider, MD  ferrous sulfate 325 (65 FE) MG tablet Take 1 tablet (325 mg total) by mouth 2 (two) times daily with a meal. 02/28/14  Yes Etta Grandchild, MD  furosemide (LASIX) 40 MG tablet Take 1.5 tablets (60 mg total) by mouth 2 (two) times daily. 03/30/15  Yes Graciella Freer, PA-C  hydrALAZINE (APRESOLINE) 10 MG tablet Take 1 tablet (10 mg total) by mouth every 8 (eight) hours. 03/30/15  Yes Graciella Freer, PA-C   HYDROcodone-acetaminophen (NORCO/VICODIN) 5-325 MG per tablet Take 1-2 tablets by mouth every 4 (four) hours as needed (breakthrough pain). 02/02/15  Yes Jerald Kief, MD  insulin aspart (NOVOLOG) 100 UNIT/ML FlexPen Inject 3 Units into the skin 3 (three) times daily with meals. 02/02/15  Yes Jerald Kief, MD  Insulin Glargine (TOUJEO SOLOSTAR) 300 UNIT/ML SOPN Inject 20 Units into the skin daily. 02/02/15  Yes Jerald Kief, MD  Insulin Pen Needle (NOVOFINE) 32G X 6 MM MISC Use daily with insulin 12/29/14  Yes Etta Grandchild, MD  isosorbide mononitrate (IMDUR) 30 MG 24 hr tablet Take 0.5 tablets (15 mg total) by mouth daily. 03/30/15  Yes Mariam Dollar Tillery, PA-C  JANUVIA 50 MG tablet Take 50 mg by mouth daily. 10/28/14  Yes Historical Provider, MD  metolazone (ZAROXOLYN) 5 MG tablet Take 1 tablet (5 mg total) by mouth See admin instructions. Take as needed for 3 lb weight gain  over discharge weight of 132 lbs. Give prior to Lasix. 03/30/15  Yes Graciella Freer, PA-C  milrinone [facility]) 20 MG/100ML SOLN infusion Inject 15.475 mcg/min into the vein continuous. 03/30/15  Yes Mariam Dollar Tillery, PA-C  ondansetron (ZOFRAN) 4 MG tablet Take 4 mg by mouth every 4 (four) hours as needed for nausea or vomiting.   Yes Historical Provider, MD  potassium chloride SA (K-DUR,KLOR-CON) 20 MEQ tablet Take 1 tablet (20 mEq total) by mouth 2 (two) times daily. 03/30/15  Yes Graciella Freer, PA-C  promethazine (PHENERGAN) 25 MG tablet Take 25 mg by mouth every 12 (twelve) hours as needed for nausea or vomiting.   Yes Historical Provider, MD   BP 95/62 mmHg  Pulse 88  Temp(Src) 97.5 F (36.4 C) (Oral)  Resp 13  Ht  (1.626 m)  Wt 132 lb 15 oz (60.3 kg)  BMI 22.81 kg/m2  SpO2 99% Physical Exam  Constitutional: She is oriented to person, place, and time. She appears well-developed and well-nourished. No distress.  HENT:  Head: Normocephalic and atraumatic.  Mouth/Throat: Oropharynx is  clear and moist.  Eyes: EOM are normal. Pupils are equal, round, and reactive to light.  Neck: Normal range of motion. Neck supple. JVD present.  Pronounced bilateral JVD  Cardiovascular: Regular rhythm.   Mild tachycardia  Pulmonary/Chest: Effort normal and breath sounds normal. No respiratory distress. She has no wheezes. She has no rales. She exhibits no tenderness.  Abdominal: Soft. Bowel sounds are normal. She exhibits no distension and no mass. There is no tenderness. There is no rebound and no guarding.  Musculoskeletal: Normal range of motion. She exhibits no edema or tenderness.  No calf swelling or tenderness.  Neurological: She is alert and oriented to person, place, and time.  Moves all extremities without deficit. Sensation is grossly intact.  Skin: Skin is warm and dry. No rash noted. No erythema.  Psychiatric: She has a normal mood and affect. Her behavior is normal.  Nursing note and vitals reviewed.   ED Course  Procedures (including critical care time) Labs Review Labs Reviewed  CBC WITH DIFFERENTIAL/PLATELET - Abnormal; Notable for the following:    WBC 24.6 (*)    RBC 3.71 (*)    Hemoglobin 11.4 (*)    HCT 35.2 (*)    RDW 15.9 (*)    Neutrophils Relative % 89 (*)    Neutro Abs 21.9 (*)    Lymphocytes Relative 5 (*)    Monocytes Absolute 1.4 (*)    All other components within normal limits  COMPREHENSIVE METABOLIC PANEL - Abnormal; Notable for the following:    Potassium 3.2 (*)    Chloride 94 (*)    Glucose, Bld 148 (*)    BUN 55 (*)    Creatinine, Ser 2.52 (*)    Total Protein 6.3 (*)    Albumin 3.2 (*)    AST 44 (*)    GFR calc non Af Amer 17 (*)    GFR calc Af Amer 19 (*)    All other components within normal limits  BRAIN NATRIURETIC PEPTIDE - Abnormal; Notable for the following:    B Natriuretic Peptide 3300.6 (*)    All other components within normal limits  TROPONIN I - Abnormal; Notable for the following:    Troponin I 0.06 (*)    All other  components within normal limits  CARBOXYHEMOGLOBIN - Abnormal; Notable for the following:    Total hemoglobin 11.1 (*)    Carboxyhemoglobin 2.0 (*)  All other components within normal limits  BLOOD GAS, ARTERIAL - Abnormal; Notable for the following:    pH, Arterial 7.456 (*)    Bicarbonate 27.1 (*)    Acid-Base Excess 3.4 (*)    All other components within normal limits  GLUCOSE, CAPILLARY - Abnormal; Notable for the following:    Glucose-Capillary 258 (*)    All other components within normal limits  GLUCOSE, CAPILLARY - Abnormal; Notable for the following:    Glucose-Capillary 213 (*)    All other components within normal limits  GLUCOSE, CAPILLARY - Abnormal; Notable for the following:    Glucose-Capillary 158 (*)    All other components within normal limits  GLUCOSE, CAPILLARY - Abnormal; Notable for the following:    Glucose-Capillary 210 (*)    All other components within normal limits  CBG MONITORING, ED - Abnormal; Notable for the following:    Glucose-Capillary 175 (*)    All other components within normal limits  CBG MONITORING, ED - Abnormal; Notable for the following:    Glucose-Capillary 299 (*)    All other components within normal limits  CBG MONITORING, ED - Abnormal; Notable for the following:    Glucose-Capillary 248 (*)    All other components within normal limits  CBG MONITORING, ED - Abnormal; Notable for the following:    Glucose-Capillary 186 (*)    All other components within normal limits  CBG MONITORING, ED - Abnormal; Notable for the following:    Glucose-Capillary 197 (*)    All other components within normal limits  CULTURE, BLOOD (ROUTINE X 2)  CULTURE, BLOOD (ROUTINE X 2)  URINE CULTURE  CULTURE, EXPECTORATED SPUTUM-ASSESSMENT  GRAM STAIN  URINALYSIS, ROUTINE W REFLEX MICROSCOPIC (NOT AT [facility])  STREP PNEUMONIAE URINARY ANTIGEN  LACTIC ACID, PLASMA  LACTIC ACID, PLASMA  PROCALCITONIN  HIV ANTIBODY (ROUTINE TESTING)  LEGIONELLA ANTIGEN,  URINE  BASIC METABOLIC PANEL  I-STAT CG4 LACTIC ACID, ED    Imaging Review Dg Chest Port 1 View  03/31/2015   CLINICAL DATA:  Hypotension. History of diabetes, hypertension, cardiac valve replacement.  EXAM: PORTABLE CHEST - 1 VIEW  COMPARISON:  Chest radiograph March 21, 2015  FINDINGS: The cardiac silhouette is moderately enlarged, unchanged. Calcified aortic knob. Status post mitral and aortic valve replacement and Common median sternotomy. Pulmonary vascular congestion increasing interstitial prominence. Patchy LEFT midlung zone consolidation with moderate LEFT pleural effusion. No pneumothorax.  RIGHT PICC distal tip projects in mid superior vena cava. Three lead LEFT cardiac pacemaker. The soft tissue planes included osseous structure nonsuspicious.  IMPRESSION: Stable cardiomegaly. Increasing pulmonary vascular congestion. Interstitial prominence concerning for pulmonary edema with increasing LEFT midlung zone consolidations/confluent edema versus subcutaneous hematoma from recent pacemaker placement. Moderate LEFT pleural effusion.  No apparent change in position of RIGHT PICC line.   Electronically Signed   By: Awilda Metro M.D.   On: 03/31/2015 04:42     EKG Interpretation   Date/Time:  Saturday March 31 2015 03:58:12 EDT Ventricular Rate:  105 PR Interval:    QRS Duration: 199 QT Interval:  439 QTC Calculation: 580 R Axis:   -43 Text Interpretation:  Atrial flutter Left bundle branch block Probable RV  involvement, suggest recording right precordial leads Confirmed by  Blyss Lugar  MD, Rayvn Rickerson ([id number]) on 03/31/2015 6:33:46 AM      MDM   Final diagnoses:  Hypotension  Hypoglycemia   Patient maintaining systolic blood pressures in the 80s. She remained asymptomatic in the emergency department. Chest x-ray concerning for  pulmonary edema. Discussed with cardiology and will consult on the patient. Discussed with Dr.Niu and will admit to step down bed.    Loren Racer,  MD 04/01/15 [phone]

## 2015-03-31 NOTE — Progress Notes (Signed)
ANTIBIOTIC CONSULT NOTE - INITIAL  Pharmacy Consult for Vancomycin and Primaxin Indication: rule out pneumonia  Allergies  Allergen Reactions  . Other Hives    STEROIDS  . Prednisone Hives and Other (See Comments)    She is allergic to all steroids!    Patient Measurements:    Vital Signs: Temp: 97.4 F (36.3 C) (06/11 0407) BP: 106/55 mmHg (06/11 0730) Pulse Rate: 89 (06/11 0730) Intake/Output from previous day:   Intake/Output from this shift:    Labs:  Recent Labs  03/29/15 0430 03/30/15 0500 03/31/15 0430  WBC  --  9.4 24.6*  HGB  --  10.1* 11.4*  PLT  --  226 267  CREATININE 2.29* 2.31* 2.52*   Estimated Creatinine Clearance: 14.9 mL/min (by C-G formula based on Cr of 2.52). No results for input(s): VANCOTROUGH, VANCOPEAK, VANCORANDOM, GENTTROUGH, GENTPEAK, GENTRANDOM, TOBRATROUGH, TOBRAPEAK, TOBRARND, AMIKACINPEAK, AMIKACINTROU, AMIKACIN in the last 72 hours.   Microbiology: Recent Results (from the past 720 hour(s))  Culture, blood (routine x 2)     Status: None   Collection Time: 03/16/15  4:18 PM  Result Value Ref Range Status   Specimen Description BLOOD LEFT WRIST  Final   Special Requests BOTTLES DRAWN AEROBIC ONLY 3CC  Final   Culture   Final    NO GROWTH 5 DAYS Performed at [facility]    Report Status 03/23/2015 FINAL  Final  Culture, blood (routine x 2)     Status: None   Collection Time: 03/16/15  4:21 PM  Result Value Ref Range Status   Specimen Description BLOOD RIGHT ARM  Final   Special Requests BOTTLES DRAWN AEROBIC AND ANAEROBIC 3CC  Final   Culture   Final    NO GROWTH 5 DAYS Performed at [facility]    Report Status 03/23/2015 FINAL  Final  Urine culture     Status: None   Collection Time: 03/16/15  5:44 PM  Result Value Ref Range Status   Specimen Description URINE, CLEAN CATCH  Final   Special Requests NONE  Final   Colony Count   Final    >=100,000 COLONIES/ML Performed at [facility]    Culture   Final    ESCHERICHIA COLI Performed at [facility]    Report Status 03/19/2015 FINAL  Final   Organism ID, Bacteria ESCHERICHIA COLI  Final      Susceptibility   Escherichia coli - MIC*    AMPICILLIN >=32 RESISTANT Resistant     CEFAZOLIN <=4 SENSITIVE Sensitive     CEFTRIAXONE <=1 SENSITIVE Sensitive     CIPROFLOXACIN <=0.25 SENSITIVE Sensitive     GENTAMICIN <=1 SENSITIVE Sensitive     LEVOFLOXACIN <=0.12 SENSITIVE Sensitive     NITROFURANTOIN <=16 SENSITIVE Sensitive     TOBRAMYCIN <=1 SENSITIVE Sensitive     TRIMETH/SULFA <=20 SENSITIVE Sensitive     PIP/TAZO <=4 SENSITIVE Sensitive     * ESCHERICHIA COLI  MRSA PCR Screening     Status: None   Collection Time: 03/16/15  7:57 PM  Result Value Ref Range Status   MRSA by PCR NEGATIVE NEGATIVE Final    Comment:        The GeneXpert MRSA Assay (FDA approved for NASAL specimens only), is one component of a comprehensive MRSA colonization surveillance program. It is not intended to diagnose MRSA infection nor to guide or monitor treatment for MRSA infections.     Medical History: Past Medical History  Diagnosis Date  . Peripheral  artery disease   . Hyperlipidemia        . Arthritis   . Aortic stenosis      a. s/p AVR  . BUNDLE BRANCH BLOCK, LEFT    . CAROTID ARTERY STENOSIS 01/05/2009  . CVA 03/14/2010  . PERIPHERAL VASCULAR DISEASE 07/30/2007  . Chronic combined systolic and diastolic CHF (congestive heart failure)     a. RHC 09/2011 RA 8, RV 58/2/11; PA 54/22 (37) PCWP 20; F CO/CI 4.57/2.67 PVR 3.7; b. L main no sig dz, LAD luminal irreg; LCx lg Ramus with luminal irreg, small AV Lcx witout sig dz, RCA luminal irreg; severe mitral annular calcifications; AV calcified c. EF 45-50% (07/2013);  d. 11/2014 Echo: EF 20-25%, diff HK, nl AV/MV, sev dil LA, mod TR/PR, PASP .  . Diabetes mellitus   . HTN (hypertension)   . Iron deficiency anemia 09/21/2011  . History of shingles   . Mitral  regurgitation     a. s/p MVR  . S/P aortic valve replacement with bioprosthetic valve 06/15/2013    21 mm [facility] Ease bovine pericardial tissue valve  . S/P mitral valve replacement with bioprosthetic valve 06/15/2013    25 mm [facility] Mitral bovine pericardial tissue valve  . Fracture, fibula, shaft 01/30/2015    right    Medications:  Amiodarone  Eliquis  Iron  Lasix  Hydralazine  Norco  Novolog  Toujeo    Imdur  Zaroxolyn  Milrinone  KCl  Januvia    Assessment: 81 y.o. female with hypotension/SOB, possible PNA s/p recent admit/discharge 5/27-6/10, for empiric antibiotics  Goal of Therapy:  Vancomycin trough level 15-20 mcg/ml  Plan:  Vancomycin 1 g IV q72h   Primaxin 250 mg IV q12h  Eddie Candle 03/31/2015,7:38 AM

## 2015-03-31 NOTE — Consult Note (Signed)
CARDIOLOGY CONSULT NOTE  Assessment and Plan:  *Nonischemic cardiomyopathy on chronic milrinone s/p BIV :  Joanna [name] is an 36 old female with nonischemic cardiomyopathy on milrinone therapy that was started in the hospital during last admission. She comes to the hospital today with hypoglycemia likely due to insulin and [place]. She will be admitted to the hospitalist service for management of hypoglycemia.   From cardiomyopathy standpoint, patient appears to have improved volume status. Her BNP are significantly reduced compared to last admission; although, they're still fairly elevated at 3300. On exam, she has elevated jugular venous distention but exam is complicated due to likely moderate to severe tricuspid regurgitation. She has no peripheral edema and chest x-ray shows pulmonary edema.  She is relatively more hypotensive compared to discharge. Her blood pressure discharge was 112/69 and now she is 80s/40s. Also, on examination, she appears to be vasodilated with warm skin to touch. She also has elevated white count although this could be from stress response from hypoglycemia. Differential includes end-stage heart failure and vaso-plegia vs. iatrogenic due to milrinone vs infection. -- Continue milrinone therapy at current dose. No dosage changes until blood glucose are within normal range.. -- Continue Lasix at current dose. Please obtain a weight. -- Defer hypoglycemic management to primary team.  *Biprostheatic aortic and mitral valve replacement: -- Echocardiogram in April showed appropriate function.  Thanks so much for this interesting consult. We'll continue to follow along.  Chief complaint: Hypoglycemia  HPI:  Joanna [name] is an 10 old female with history of NICM (4/16 Echo EF 20-25%) on milrinone 0.25, AVR/MVR in 2014, IDDM, hypertension returns to the hospital after being discharged yesterday now with hypoglycemia. Joanna [name] presented to the hospital about 2 weeks ago  with symptoms of low output heart failure. During the hospitalization, she was started on milrinone 0.25 and was aggressively diuresed. She was discharged yesterday to the skilled nursing facility. Per daughter, she was doing fairly well this afternoon until tonight when she became lightheaded, diaphoretic and nauseated. Her blood glucose was checked and was found to be in 20s. She was given glucagon and sugar water with improvement in her symptoms. In the emergency department, her blood glucose had improved 149. Patient is on insulin and Januvia for type 2 diabetes management.   Past Medical History Past Medical History  Diagnosis Date  . Peripheral artery disease   . Hyperlipidemia        . Arthritis   . Aortic stenosis      a. s/p AVR  . BUNDLE BRANCH BLOCK, LEFT    . CAROTID ARTERY STENOSIS 01/05/2009  . CVA 03/14/2010  . PERIPHERAL VASCULAR DISEASE 07/30/2007  . Chronic combined systolic and diastolic CHF (congestive heart failure)     a. RHC 09/2011 RA 8, RV 58/2/11; PA 54/22 (37) PCWP 20; F CO/CI 4.57/2.67 PVR 3.7; b. L main no sig dz, LAD luminal irreg; LCx lg Ramus with luminal irreg, small AV Lcx witout sig dz, RCA luminal irreg; severe mitral annular calcifications; AV calcified c. EF 45-50% (07/2013);  d. 11/2014 Echo: EF 20-25%, diff HK, nl AV/MV, sev dil LA, mod TR/PR, PASP .  . Diabetes mellitus   . HTN (hypertension)   . Iron deficiency anemia 09/21/2011  . History of shingles   . Mitral regurgitation     a. s/p MVR  . S/P aortic valve replacement with bioprosthetic valve 06/15/2013    21 mm [facility] Ease bovine pericardial tissue valve  . S/P  mitral valve replacement with bioprosthetic valve 06/15/2013    25 mm [facility] Mitral bovine pericardial tissue valve  . Fracture, fibula, shaft 01/30/2015    right    Allergies: Allergies  Allergen Reactions  . Other Hives    STEROIDS  . Prednisone Hives and Other (See Comments)    She is allergic to all steroids!     Social History History   Social History  . Marital Status: Divorced    Spouse Name: N/A  . Number of Children: N/A  . Years of Education: N/A   Occupational History  . Not on file.   Social History Main Topics  . Smoking status: Never Smoker   . Smokeless tobacco: Never Used  . Alcohol Use: No  . Drug Use: No  . Sexual Activity: No   Other Topics Concern  . Not on file   Social History Narrative   Patient lives alone here in town   She continues to work, helping to bind and oversew books   She is divorced after 23 years of marriage   1 son- '61, minister; 1 dtr '55; 4 grandchildren   End of life care-provided packet on living well    Family History Family History  Problem Relation Age of Onset  . Colon cancer Mother   . Diabetes Neg Hx   . Coronary artery disease Neg Hx     Physical Exam Filed Vitals:   03/31/15 0600  BP: 88/49  Pulse: 84  Temp:   Resp: 14    Gen: Very somnolent (family states that patient has not slept all night) HEENT: Moist mucous membranes Neck: Large V waves due to tricuspid regurgitation CV: Regular, normal S1, normal S2, no gallops, 2/6 holosystolic murmur best heard at the sternal borders. Pulm: Clear to auscultation bilaterally Abdomen: Soft, nontender, nondistended Ext: Warm to touch, no obvious rash or signs of infection  Labs:  Results for orders placed or performed during the hospital encounter of 03/31/15 (from the past 24 hour(s))  CBG monitoring, ED     Status: Abnormal   Collection Time: 03/31/15  3:53 AM  Result Value Ref Range   Glucose-Capillary 175 (H) 65 - 99 mg/dL  CBC with Differential/Platelet     Status: Abnormal   Collection Time: 03/31/15  4:30 AM  Result Value Ref Range   WBC 24.6 (H) 4.0 - 10.5 K/uL   RBC 3.71 (L) 3.87 - 5.11 MIL/uL   Hemoglobin 11.4 (L) 12.0 - 15.0 g/dL   HCT 16.1 (L) 09.6 - 04.5 %   MCV 94.9 78.0 - 100.0 fL   MCH 30.7 26.0 - 34.0 pg   MCHC 32.4 30.0 - 36.0 g/dL   RDW 40.9 (H)  81.1 - 15.5 %   Platelets 267 150 - 400 K/uL   Neutrophils Relative % 89 (H) 43 - 77 %   Neutro Abs 21.9 (H) 1.7 - 7.7 K/uL   Lymphocytes Relative 5 (L) 12 - 46 %   Lymphs Abs 1.2 0.7 - 4.0 K/uL   Monocytes Relative 6 3 - 12 %   Monocytes Absolute 1.4 (H) 0.1 - 1.0 K/uL   Eosinophils Relative 0 0 - 5 %   Eosinophils Absolute 0.1 0.0 - 0.7 K/uL   Basophils Relative 0 0 - 1 %   Basophils Absolute 0.0 0.0 - 0.1 K/uL  Comprehensive metabolic panel     Status: Abnormal   Collection Time: 03/31/15  4:30 AM  Result Value Ref Range   Sodium 136  135 - 145 mmol/L   Potassium 3.2 (L) 3.5 - 5.1 mmol/L   Chloride 94 (L) 101 - 111 mmol/L   CO2 29 22 - 32 mmol/L   Glucose, Bld 148 (H) 65 - 99 mg/dL   BUN 55 (H) 6 - 20 mg/dL   Creatinine, Ser 1.61 (H) 0.44 - 1.00 mg/dL   Calcium 9.2 8.9 - 09.6 mg/dL   Total Protein 6.3 (L) 6.5 - 8.1 g/dL   Albumin 3.2 (L) 3.5 - 5.0 g/dL   AST 44 (H) 15 - 41 U/L   ALT 27 14 - 54 U/L   Alkaline Phosphatase 72 38 - 126 U/L   Total Bilirubin 1.0 0.3 - 1.2 mg/dL   GFR calc non Af Amer 17 (L) >60 mL/min   GFR calc Af Amer 19 (L) >60 mL/min   Anion gap 13 5 - 15  Brain natriuretic peptide     Status: Abnormal   Collection Time: 03/31/15  4:30 AM  Result Value Ref Range   B Natriuretic Peptide 3300.6 (H) 0.0 - 100.0 pg/mL  Troponin I     Status: Abnormal   Collection Time: 03/31/15  4:30 AM  Result Value Ref Range   Troponin I 0.06 (H) <0.031 ng/mL  CBG monitoring, ED     Status: Abnormal   Collection Time: 03/31/15  6:17 AM  Result Value Ref Range   Glucose-Capillary 299 (H) 65 - 99 mg/dL

## 2015-03-31 NOTE — Progress Notes (Signed)
Pt. Looking for her ortho. Boots which she left in [place] [facility], requested daughter [name] to bring to the hosp.

## 2015-03-31 NOTE — ED Notes (Signed)
Pt from [facility] via [facility] c/o hypoglycemia. Staff reported CBG of 20; given glucagon and sugar water; cbg increased to 96. Pt had recent pacemaker placed and has PICC line to R upper arm with milrinone infusing.  122/60; 100; 94%

## 2015-03-31 NOTE — ED Notes (Signed)
I attempted to obtain a sputum specimen but the patient was not able to produce any with coughing.

## 2015-03-31 NOTE — H&P (Addendum)
Patient was seen, examined, treatment plan was discussed with the Physician extender. I have directly reviewed the clinical findings, lab, imaging studies and management of this patient in detail. I have made the necessary changes to the above noted documentation, and agree with the documentation, as recorded by the Physician extender.    Brief narrative 81 year old female with chronic systolic congestive heart failure (last 2-D echo in April 2016 showed ejection fraction of 20-25%), history of atrial fibrillation, history of AVR/MVR in 06/15/2013, hypertension, diabetes mellitus type 2, dyslipidemia, recent hospitalization for acute decompensated systolic CHF (hospitalized from 03/16/2015 through 03/30/2015) during which time Joanna [name] had pacemaker placement and has required milrinone infusion chronically. Joanna [name] was subsequently discharged to skilled nursing facility. Patient presented to [facility] ER with reports of hypoglycemia and CBGs in 20s. Joanna [name] had reports of lightheadedness, nausea but no vomiting. When Joanna [name] was given glucagon and dextrose her symptoms improved. There was no reports of respiratory distress.  In ED, blood pressure was 83/49, heart rate 55 227, respiratory rate 12-21, afebrile and oxygen saturation 94% with nasal cannula oxygen support (please note there was a reprot of hypoxia and oxygen saturation in 80's on room air prior to arrival to ED. Blood work was notable for white blood cell count of 24.6, hemoglobin 11.4, potassium 3.2 and creatinine 2.52. Troponin level was 0.06. The 12 lead EKG showed underlying atrial fibrillation and left bundle branch block  unchanged from previous tracings. CXR showed stable cardiomegaly, increased vascular congestion, possible pulmonary edema and increasing left middling consolidation possible edema or subcutaneous hematoma from recent pacemaker placement.   Assessment & Plan  Principal Problem: Sepsis due to pneumonia / HCAP, unspecified organism /  Leukocytosis / Acute respiratory failure with hypoxia - Sepsis criteria met on the admission with hypotension, tachycardia, tachypnea, hypoxia, leukocytosis and elevated procalcitonin level. Source of infection - possible pneumonia, HCAP - Sepsis protocol initiated. Follow-up blood culture results, trend lactic acid level.  - Pneumonia order set placed. Follow up strep pneumonia, Legionella, respiratory culture results. - Patient was started on broad-spectrum antibodies, vancomycin and Zosyn. - Monitor in step down unit. - Current blood pressure is 97/58. Patient does not require pressor support at this time. - Joanna [name] is DNR/DNI with exception of using BiPAP (family ok with that).  Active Problems: Hypokalemia - Likely from lasix - Supplemented   Troponin elevation - Likely demand ischemia from sepsis, CKD - Mild troponin elevation at 0.06  - No complaints of chest pain - We will not cycle cardiac enzymes further  CKD (chronic kidney disease) stage 3, GFR 30-59 ml/min - Renal function stable and around baseline of 2.4  PAF (paroxysmal atrial fibrillation) - CHADS vasc score 6 - Rate controlled (used to be on amiodarone but per cardiology notes review Joanna [name] is off of it due to extreme nausea) - Amiodarone previously discontinued because of recurrent nausea - Continue eliquis   Leisa Lenz [facility]  [phone]  * For further details on A/P please refer to the physician extender's not done below    [facility] History and Physical  Joanna [name], is a 81 y.o. female  MRN: [id number]   DOB - 1930/09/04  Admit Date - 03/31/2015  Outpatient Primary MD for the patient is Scarlette Calico, MD  Referring MD: Lita Mains / ER  Consulting MD: Posey Pronto / Cardiology  With History of -  Past Medical History  Diagnosis Date  . Peripheral artery disease   . Hyperlipidemia        . Arthritis   . Aortic stenosis       a. s/p AVR  . BUNDLE BRANCH BLOCK, LEFT    . CAROTID ARTERY STENOSIS 01/05/2009  . CVA 03/14/2010  . PERIPHERAL VASCULAR DISEASE 07/30/2007  . Chronic combined systolic and diastolic CHF (congestive heart failure)     a. [place] 09/2011 RA 8, RV 58/2/11; PA 54/22 (37) PCWP 20; F CO/CI 4.57/2.67 PVR 3.7; b. L main no sig dz, LAD luminal irreg; LCx lg Ramus with luminal irreg, small AV Lcx witout sig dz, RCA luminal irreg; severe mitral annular calcifications; AV calcified c. EF 45-50% (07/2013);  d. 11/2014 Echo: EF 20-25%, diff HK, nl AV/MV, sev dil LA, mod TR/PR, PASP 5mHg.  . Diabetes mellitus   . HTN (hypertension)   . Iron deficiency anemia 09/21/2011  . History of shingles   . Mitral regurgitation     a. s/p MVR  . S/P aortic valve replacement with bioprosthetic valve 06/15/2013    [facility]  . S/P mitral valve replacement with bioprosthetic valve 06/15/2013    [facility]  . Fracture, fibula, shaft 01/30/2015    right      Past Surgical History  Procedure Laterality Date  . Carotid endarterectomy Left 2011  . Polypectomy  12/27/04  . Cataract extraction Bilateral 2009  . Tee without cardioversion N/A 05/19/2013    Procedure: TRANSESOPHAGEAL ECHOCARDIOGRAM (TEE);  Surgeon: DLarey Dresser MD;  [facility]  Service: Cardiovascular;  Laterality: N/A;  . Colonoscopy    . Cardiac catheterization  2012  . Aortic valve replacement N/A 06/15/2013    Procedure: AORTIC VALVE REPLACEMENT (AVR);  Surgeon: CRexene Alberts MD;  [place] [facility]  Service: Open Heart Surgery;  Laterality: N/A;  . Intraoperative transesophageal echocardiogram N/A 06/15/2013    Procedure: INTRAOPERATIVE TRANSESOPHAGEAL ECHOCARDIOGRAM;  Surgeon: CRexene Alberts MD;  [place] [facility]  Service: Open Heart Surgery;  Laterality: N/A;  . Mitral valve replacement N/A 06/15/2013    Procedure: MITRAL VALVE (MV)  REPLACEMENT;  Surgeon: CRexene Alberts MD;  [place] [facility]  Service: Open Heart Surgery;  Laterality: N/A;  . Epicardial pacing lead placement N/A 06/15/2013    Procedure: EPICARDIAL PACING LEAD PLACEMENT;  Surgeon: CRexene Alberts MD;  [place] [facility]  Service: Open Heart Surgery;  Laterality: N/A;  . Left and right heart catheterization with coronary angiogram N/A 09/19/2011    Procedure: LEFT AND RIGHT HEART CATHETERIZATION WITH CORONARY ANGIOGRAM;  Surgeon: DJolaine Artist MD;  [facility];  Service: Cardiovascular;  Laterality: N/A;  . Orif ankle fracture Right 01/31/2015    Procedure: OPEN REDUCTION INTERNAL FIXATION (ORIF) ANKLE FRACTURE;  Surgeon: MMarybelle Killings MD;  [place] [facility]  Service: Orthopedics;  Laterality: Right;  . Ep implantable device N/A 03/21/2015    Procedure: BiV Pacemaker Insertion CRT-P;  Surgeon: SDeboraha Sprang MD;  [facility];  Service: Cardiovascular;  Laterality: N/A;  . Pacemaker insertion  in for   Chief Complaint  Patient presents with  . Hypoglycemia     HPI This is a patient just discharged on 6/10 at the cardiology service after an admission for acute on chronic systolic heart failure exacerbation manifesting as cardiogenic shock. During that hospitalization Joanna [name] underwent placement of a [employer]. Jude CRT-D on 6/1 and was requiring milrinone infusion chronically and therefore was discharged to and on a trip capable skilled nursing facility ([name]). During the previous hospitalization goals of care were addressed per the palliative medicine team and although the patient's daughter and son were realistic regarding the patient's long-term prognosis the patient was not yet ready to transition to comfort-based care and described herself as a Management consultant". Joanna [name] was made a partial resuscitation with the only potentially aggressive interventions being BiPAP/CPAP.  Patient was sent to the ER because of complaints of  lightheadedness diaphoresis and nausea and setting of hypoglycemia with CBG in the 20s. At the nursing facility Joanna [name] was given glucagon and dextrose fluids by mouth with improvement in her symptoms. Joanna [name] did not have any chest pain or shortness of breath upon arrival to the ER but after arrival Joanna [name] was found to be hypoxemic with sats in the high 80s noting Joanna [name] was not hypoxic prior to discharge to facility. Joanna [name] was hypotensive with a blood pressure of 83/49,  tachycardic with a heart rate of 127 but was afebrile. Joanna [name] was requiring 3 L nasal cannula oxygen. Joanna [name] was not tachypneic. Her chest x-ray was read as Interstitial prominence concerning for pulmonary edema with increasing LEFT midlung zone consolidations/confluent edema, Joanna [name] also was found to have an elevated white count of 24,000 noting her white count was 9000 on date of discharge. Renal function was stable and at baseline. CBGs have remained stable and greater than 100 since arrival. Actiq acid was normal 0.77. BNP was 3300 and troponin was trace elevated at 0.06 which is noted to be a chronic mild elevation for her. Blood cultures were obtained in the ER. EKG was underlying atrial fibrillation and left bundle branch block is unchanged from previous tracings. Joanna [name] does have intermittent pacing noted on bedside telemetry. During my interview with the patient Joanna [name] again reiterated no issues with shortness of breath dyspnea on exertion worsened baseline or cough. Joanna [name] has been noticing some dysuria and suprapubic pressure since discharge.   Review of Systems   In addition to the HPI above,  No Fever-chills, myalgias or other constitutional symptoms No Headache, changes with Vision or hearing, new focal weakness, tingling, numbness in any extremity, No problems swallowing food or Liquids, indigestion/reflux No Chest pain, Cough or Shortness of Breath, palpitations, orthopnea or DOE No N/V; no melena or hematochezia, no dark tarry stools, Bowel movements are  regular, No hematuria or flank pain No new skin rashes, lesions, masses or bruises, No new joints pains-aches No recent weight gain or loss No polyuria, polydypsia or polyphagia,  *A full 10 point Review of Systems was done, except as stated above, all other Review of Systems were negative.  Social History History  Substance Use Topics  . Smoking status: Never Smoker   . Smokeless tobacco: Never Used  . Alcohol Use: No    Resides at: Temporarily at [facility] for rehabilitative therapy  Lives with: N/A  Ambulatory status: With assistance/RW   Family History Family History  Problem Relation Age of Onset  . Colon cancer Mother   . Diabetes Neg Hx   . Coronary artery disease Neg Hx  Prior to Admission medications   Medication Sig Start Date End Date Taking? Authorizing Provider  amiodarone (PACERONE) 200 MG tablet Take 1 tablet (200 mg total) by mouth 2 (two) times daily. 03/30/15  Yes Shirley Friar, PA-C  apixaban (ELIQUIS) 2.5 MG TABS tablet Take 1 tablet (2.5 mg total) by mouth 2 (two) times daily. 03/30/15  Yes Shirley Friar, PA-C  calcium citrate-vitamin D (CITRACAL+D) 315-200 MG-UNIT per tablet Take 1 tablet by mouth daily.   Yes Historical Provider, MD  docusate sodium (COLACE) 100 MG capsule Take 100 mg by mouth 2 (two) times daily.     Yes Historical Provider, MD  ferrous sulfate 325 (65 FE) MG tablet Take 1 tablet (325 mg total) by mouth 2 (two) times daily with a meal. 02/28/14  Yes Janith Lima, MD  furosemide (LASIX) 40 MG tablet Take 1.5 tablets (60 mg total) by mouth 2 (two) times daily. 03/30/15  Yes Shirley Friar, PA-C  hydrALAZINE (APRESOLINE) 10 MG tablet Take 1 tablet (10 mg total) by mouth every 8 (eight) hours. 03/30/15  Yes Shirley Friar, PA-C  HYDROcodone-acetaminophen (NORCO/VICODIN) 5-325 MG per tablet Take 1-2 tablets by mouth every 4 (four) hours as needed (breakthrough pain). 02/02/15  Yes Donne Hazel, MD   isosorbide mononitrate (IMDUR) 30 MG 24 hr tablet Take 0.5 tablets (15 mg total) by mouth daily. 03/30/15  Yes Satira Mccallum Tillery, PA-C  JANUVIA 50 MG tablet Take 50 mg by mouth daily. 10/28/14  Yes Historical Provider, MD  potassium chloride SA (K-DUR,KLOR-CON) 20 MEQ tablet Take 1 tablet (20 mEq total) by mouth 2 (two) times daily. 03/30/15  Yes Shirley Friar, PA-C  acetaminophen (TYLENOL) 325 MG tablet Take 325 mg by mouth every 6 (six) hours as needed for mild pain.    Historical Provider, MD  insulin aspart (NOVOLOG) 100 UNIT/ML FlexPen Inject 3 Units into the skin 3 (three) times daily with meals. 02/02/15   Donne Hazel, MD  Insulin Glargine (TOUJEO SOLOSTAR) 300 UNIT/ML SOPN Inject 20 Units into the skin daily. 02/02/15   Donne Hazel, MD  Insulin Pen Needle (NOVOFINE) 32G X 6 MM MISC Use daily with insulin 12/29/14   Janith Lima, MD  metolazone (ZAROXOLYN) 5 MG tablet Take 1 tablet (5 mg total) by mouth See admin instructions. Take as needed for 3 lb weight gain over discharge weight of 132 lbs. Give prior to Lasix. 03/30/15   Shirley Friar, PA-C  milrinone [facility]) 20 MG/100ML SOLN infusion Inject 15.475 mcg/min into the vein continuous. 03/30/15   Shirley Friar, PA-C  ondansetron (ZOFRAN) 4 MG tablet Take 4 mg by mouth every 4 (four) hours as needed for nausea or vomiting.    Historical Provider, MD  promethazine (PHENERGAN) 25 MG tablet Take 25 mg by mouth every 12 (twelve) hours as needed for nausea or vomiting.    Historical Provider, MD    Allergies  Allergen Reactions  . Other Hives    STEROIDS  . Prednisone Hives and Other (See Comments)    Joanna [name] is allergic to all steroids!    Physical Exam  Vitals  Blood pressure 106/55, pulse 89, temperature 97.4 F (36.3 C), resp. rate 17, SpO2 99 %.   General:  In no acute distress, appears chronically ill and appears stated age  Psych:  Normal affect, Denies Suicidal or Homicidal ideations, Awake  Alert, Oriented X 3. Speech and thought patterns are clear and appropriate, no apparent short term memory  deficits  Neuro:   No focal neurological deficits, CN II through XII intact, Strength 5/5 all 4 extremities, Sensation intact all 4 extremities.  ENT:  Ears and Eyes appear Normal, Conjunctivae clear, PER. Moist oral mucosa without erythema or exudates.  Neck:  Supple, No lymphadenopathy appreciated  Respiratory:  Symmetrical chest wall movement, diminished breath sounds bilateral bases although more marked in the left base with diminished sounds up toward midfield, expiratory crackles right mid field, 3 L oxygen  Cardiac:  RRR, No Murmurs, no LE edema noted, no JVD, No carotid bruits, peripheral pulses palpable at 2+  Abdomen:  Positive bowel sounds, Soft, Non tender, Non distended,  No masses appreciated, no obvious hepatosplenomegaly  Skin:  No Cyanosis, Normal Skin Turgor, No Skin Rash or Bruise.  Extremities: Symmetrical without obvious trauma or injury,  no effusions.  Data Review  CBC  Recent Labs Lab 03/25/15 0525 03/27/15 0433 03/30/15 0500 03/31/15 0430  WBC 9.4 10.5 9.4 24.6*  HGB 10.7* 11.3* 10.1* 11.4*  HCT 34.1* 36.0 31.7* 35.2*  PLT 184 236 226 267  MCV 93.9 94.5 93.8 94.9  MCH 29.5 29.7 29.9 30.7  MCHC 31.4 31.4 31.9 32.4  RDW 16.0* 15.9* 15.8* 15.9*  LYMPHSABS  --   --   --  1.2  MONOABS  --   --   --  1.4*  EOSABS  --   --   --  0.1  BASOSABS  --   --   --  0.0    Chemistries   Recent Labs Lab 03/27/15 0433 03/28/15 0440 03/29/15 0430 03/30/15 0500 03/31/15 0430  NA 135 133* 133* 135 136  K 5.9* 4.4 4.3 3.2* 3.2*  CL 94* 94* 94* 95* 94*  CO2 32 _0 GLUCOSE 140* 87 132* 118* 148*  BUN 51* 56* 55* 50* 55*  CREATININE 2.27* 2.24* 2.29* 2.31* 2.52*  CALCIUM 9.5 8.9 8.8* 9.1 9.2  AST  --   --   --   --  44*  ALT  --   --   --   --  27  ALKPHOS  --   --   --   --  72  BILITOT  --   --   --   --  1.0    estimated creatinine  clearance is 14.9 mL/min (by C-G formula based on Cr of 2.52).  No results for input(s): TSH, T4TOTAL, T3FREE, THYROIDAB in the last 72 hours.  Invalid input(s): FREET3  Coagulation profile No results for input(s): INR, PROTIME in the last 168 hours.  No results for input(s): DDIMER in the last 72 hours.  Cardiac Enzymes  Recent Labs Lab 03/31/15 0430  TROPONINI 0.06*    Invalid input(s): POCBNP  Urinalysis    Component Value Date/Time   COLORURINE YELLOW 03/16/2015 1744   APPEARANCEUR HAZY* 03/16/2015 1744   LABSPEC 1.013 03/16/2015 1744   PHURINE 5.5 03/16/2015 1744   GLUCOSEU NEGATIVE 03/16/2015 1744   GLUCOSEU >=1000* 12/25/2014 1006   HGBUR NEGATIVE 03/16/2015 1744   BILIRUBINUR SMALL* 03/16/2015 1744   KETONESUR NEGATIVE 03/16/2015 1744   PROTEINUR NEGATIVE 03/16/2015 1744   UROBILINOGEN 1.0 03/16/2015 1744   NITRITE NEGATIVE 03/16/2015 1744   LEUKOCYTESUR MODERATE* 03/16/2015 1744    Imaging results:   Dg Chest 2 View  03/16/2015   CLINICAL DATA:  Acute onset of chest pain and shortness of breath. Cough and congestion. Initial encounter.  EXAM: CHEST  2 VIEW  COMPARISON:  Chest radiograph  performed 12/14/2014  FINDINGS: The lungs are well-aerated. A small left pleural effusion is noted. Chronically increased interstitial markings are noted. Mild peribronchial thickening is noted. No pneumothorax is identified. Scarring is seen at the left lung apex.  The heart is enlarged. The patient is status post median sternotomy. A valve replacement is noted. Orphaned pacemaker leads are visualized. No acute osseous abnormalities are seen.  IMPRESSION: Small left pleural effusion noted. Chronically increased interstitial markings seen. Cardiomegaly.   Electronically Signed   By: Garald Balding M.D.   On: 03/16/2015 17:26   Dg Ankle Complete Right  03/23/2015   CLINICAL DATA:  Post right ankle surgery 6 weeks ago.  EXAM: RIGHT ANKLE - COMPLETE 3+ VIEW  COMPARISON:  01/30/2015   FINDINGS: Plate and screw fixation device noted across the distal fibular shaft fracture. Medial malleolar screws in place across the medial malleolar fractures. Anatomic alignment. No acute bony abnormality. Fracture lines are difficult to visualize. Fine bony detail is obscured by overlying cast material.  IMPRESSION: Evidence of healing across the distal fibular shaft fracture and medial malleolar fractures. Fine bony detail obscured by overlying cast.   Electronically Signed   By: Rolm Baptise M.D.   On: 03/23/2015 15:41   Ct Head Wo Contrast  03/17/2015   CLINICAL DATA:  Acute encephalopathy.  No headache.  EXAM: CT HEAD WITHOUT CONTRAST  TECHNIQUE: Contiguous axial images were obtained from the base of the skull through the vertex without intravenous contrast.  COMPARISON:  12/15/2014  FINDINGS: The ventricles are normal configuration. There is ventricular and sulcal enlargement reflecting mild to moderate age related atrophy. No hydrocephalus.  There are no parenchymal masses or mass effect. There is no evidence of a cortical infarct. Mild periventricular white matter hypoattenuation is noted consistent with chronic microvascular ischemic change.  There are no extra-axial masses or abnormal fluid collections.  No intracranial hemorrhage.  Sinuses and mastoid air cells are clear.  IMPRESSION: 1. No acute intracranial abnormalities. 2. Mild to moderate atrophy. Mild chronic microvascular ischemic change.   Electronically Signed   By: Lajean Manes M.D.   On: 03/17/2015 16:35   US Abdomen Complete  03/17/2015   CLINICAL DATA:  Nausea and vomiting  EXAM: ULTRASOUND ABDOMEN COMPLETE  COMPARISON:  None.  FINDINGS: Gallbladder: Gallbladder wall thickening measuring 6 mm and pericholecystic fluid noted. Adherent sludge balls or polyps, largest 3 mm. No sonographic Murphy sign.  Common bile duct: Diameter: 7 mm  Liver: No focal lesion identified. Within normal limits in parenchymal echogenicity.  IVC: No  abnormality visualized.  Pancreas: Visualized portion unremarkable.  Spleen: Size and appearance within normal limits.  Right Kidney: Length: 11.2 cm, increased renal cortical echogenicity. No hydronephrosis. Cortical cysts are noted, largest 1.5 x 1.4 x 1.4 cm.  Left Kidney: Length: 10.7 cm. Suboptimally visualized due to lack of optimal acoustic window with increased cortical echogenicity but no focal abnormality or mass. No hydronephrosis.  Abdominal aorta: No aneurysm visualized.  Other findings: Ascites and bilateral pleural effusions are noted. Incidental note is made of bladder distention, volume 1094 cc.  IMPRESSION: Increased renal cortical echogenicity suggesting medical renal disease, with right renal cysts.  Bladder distention.  Gallbladder wall thickening, pericholecystic fluid, and adherent sludge balls or polyps. If the patient has right upper quadrant abdominal pain, findings would be highly suspicious for acute cholecystitis. However, there is ascites and gallbladder wall thickening may also be secondary to liver disease or hypoproteinemia.  Pleural effusions and ascites.   Electronically Signed  By: Conchita Paris M.D.   On: 03/17/2015 09:30   Dg Chest Port 1 View  03/31/2015   CLINICAL DATA:  Hypotension. History of diabetes, hypertension, cardiac valve replacement.  EXAM: PORTABLE CHEST - 1 VIEW  COMPARISON:  Chest radiograph March 21, 2015  FINDINGS: The cardiac silhouette is moderately enlarged, unchanged. Calcified aortic knob. Status post mitral and aortic valve replacement and Common median sternotomy. Pulmonary vascular congestion increasing interstitial prominence. Patchy LEFT midlung zone consolidation with moderate LEFT pleural effusion. No pneumothorax.  RIGHT PICC distal tip projects in mid superior vena cava. Three lead LEFT cardiac pacemaker. The soft tissue planes included osseous structure nonsuspicious.  IMPRESSION: Stable cardiomegaly. Increasing pulmonary vascular congestion.  Interstitial prominence concerning for pulmonary edema with increasing LEFT midlung zone consolidations/confluent edema versus subcutaneous hematoma from recent pacemaker placement. Moderate LEFT pleural effusion.  No apparent change in position of RIGHT PICC line.   Electronically Signed   By: Elon Alas M.D.   On: 03/31/2015 04:42   Dg Chest Port 1 View  03/21/2015   CLINICAL DATA:  Pacemaker insertion today. Postprocedural radiograph.  EXAM: PORTABLE CHEST - 1 VIEW  COMPARISON:  03/20/2015.  FINDINGS: Support apparatus: Monitoring leads project over the chest. New pacemaker power pack with 3 LEFT subclavian leads. Leads appear in appropriate position with coronary sinus lead. Aortic valve replacements are present. Old epicardial pacing leads are present.  The cardiopericardial silhouette remains enlarged. There is bilateral interstitial and mild alveolar airspace disease compatible with pulmonary edema/volume overload. Aortic arch atherosclerosis.  IMPRESSION:  1. Interval placement of 3 lead LEFT subclavian pacemaker and power pack. 2. Negative for pneumothorax or complicating features. 3. Mild interstitial and alveolar pulmonary edema.   Electronically Signed   By: Dereck Ligas M.D.   On: 03/21/2015 17:02   Dg Chest Port 1 View  03/20/2015   CLINICAL DATA:  PICC line placement  EXAM: PORTABLE CHEST - 1 VIEW  COMPARISON:  03/17/2015  FINDINGS: Right PICC line is in place. The tip is in the SVC. There is cardiomegaly. Devitalized left pacer wires remain in place, unchanged. Prior valve replacement. Left lower lobe opacity with left effusion again noted, unchanged.  IMPRESSION: Right PICC line tip in the SVC.  No change in the left lower lobe opacity and left effusion.   Electronically Signed   By: Rolm Baptise M.D.   On: 03/20/2015 11:14   Dg Chest Port 1 View  03/17/2015   CLINICAL DATA:  Central line placement.  EXAM: PORTABLE CHEST - 1 VIEW  COMPARISON:  03/16/2015  FINDINGS: Patient is  moderately rotated to the left. Sternotomy wires and prosthetic heart valve unchanged. Evidence of a right IJ central venous catheter with tip likely over the SVC accounting for patient's rotation. No evidence of pneumothorax. There is continued left base opacification unchanged to slightly worse likely effusions/ atelectasis and less likely infection. Mild prominence of the perihilar markings suggesting mild vascular congestion. Remainder of the exam is unchanged.  IMPRESSION: Slight worsening left base opacification likely effusion with atelectasis although cannot exclude infection.  Suggestion mild vascular congestion.  Right IJ central venous catheter with tip likely over the region of the SVC. No pneumothorax.   Electronically Signed   By: Marin Olp M.D.   On: 03/17/2015 12:51     EKG: (Independently reviewed) atrial fibrillation with left bundle branch block   Assessment & Plan  Principal Problem:   Sepsis associated hypotension -Admit to SDU -Initial hypotension has resolved but as  precaution we'll initially hold anti hypertensive medications and reduce normal diabetic therapy -Suspected source pulmonary and possibly urinary tract -Begin broad-spectrum antibiotic -Current hypotension has resolved and given known severe systolic dysfunction requiring milrinone would utilize when necessary bolus fluids over continuous infusion for recurrent hypotension  Active Problems:   Acute respiratory failure with hypoxia:   A) HCAP    B) Pleural effusion, left -Begin vancomycin and Primaxin with pharmacy dosing -Routine serologies based on pneumonia protocols -Sputum culture expectorated -Follow up on blood cultures -Continue supportive care with oxygen, nebs and incentive spirometry -Unclear based on chest x-ray if effusion primarily mediated by heart failure or parapneumonic effusion    Leukocytosis -Suspect related to above problem -Patient endorses mild dysuria with urinary frequency  and suprapubic pain so follow-up on urinalysis and culture    Diabetes mellitus with hypoglycemia -Holding Januvia and Toujeo for now -Check CBG every 4 hours and provide SSI -Most recent CBGs have trended up to greater than 200 -Suspected initial hypoglycemia possibly mediated by evolving septic process    Iron deficiency anemia -Hemoglobin stable and at baseline of around 10-11 -Holding iron replacement until proves can tolerate oral intake    CKD (chronic kidney disease) stage 3, GFR 30-59 ml/min -Renal function stable and around baseline of 2.4 -Holding Zaroxolyn for now    PAF (paroxysmal atrial fibrillation) -Rate controlled -Amiodarone previously discontinued because of recurrent nausea -Continue eliquis -CHADVASc = 6    Chronic systolic congestive heart failure, NYHA class 4 -Appears compensated -Continue preadmission dose milrinone infusion -With recent hypotension well decrease Lasix to 40 mg by mouth twice a day and hold Zaroxolyn and reevaluate in 24 hours -Also holding hydralazine and indoor in setting of recent hypotension   Physical deconditioning -Once sepsis physiology resolved will need PT/OT evaluation    Hyperlipidemia with target LDL less than 100 -Currently not on statin therapy    Essential hypertension -See above    Pulmonary HTN -Hydralazine and Imdur on hold as above    S/P mitral and aortic valve replacement with bioprosthetic valve    DVT Prophylaxis: Eliquis  Family Communication:   Daughter at bedside  Code Status:  Partial resuscitation primarily will only utilize CPAP/BiPAP  Condition:  Guarded  Discharge disposition: Anticipate will discharge back to [facility] for continued rehabilitative therapies  Time spent in minutes : 60      ELLIS,ALLISON L. ANP on 03/31/2015 at 7:54 AM  Between 7am to 7pm - Pager - [phone]  After 7pm go to [email or web address] - password [email or web address]  And look for the night coverage person covering me  after hours  [facility]

## 2015-03-31 NOTE — ED Notes (Signed)
POCT CBG 248

## 2015-03-31 NOTE — ED Notes (Signed)
Spoke with Revonda Standard NP, verbal order to switch Primaxin to Zosyn. Pharmacy notified and will change.

## 2015-03-31 NOTE — ED Notes (Signed)
Patient used bedside commode and had bowel movement in urine.

## 2015-03-31 NOTE — ED Notes (Signed)
Cardiology at bedside. Pt resting; alert with stimulation. Family remains at bedside

## 2015-03-31 NOTE — ED Notes (Signed)
Service Response has been called for tray order ([facility]; Carb.modified)-per RN 11:40.

## 2015-03-31 NOTE — ED Notes (Signed)
CBG 186 

## 2015-03-31 NOTE — Progress Notes (Signed)
Admitted from the ED awake and alert, home milrinone in progress, transitioned to hospital milrinone. No discomfort presented.

## 2015-03-31 NOTE — ED Notes (Signed)
Patient ate entire meal

## 2015-03-31 NOTE — Progress Notes (Signed)
ANTIBIOTIC CONSULT NOTE - INITIAL  Pharmacy Consult for Vanco/Zosyn Indication: Sepsis/PNA  Allergies  Allergen Reactions  . Other Hives    STEROIDS  . Prednisone Hives and Other (See Comments)    She is allergic to all steroids!    Patient Measurements:   Adjusted Body Weight:    Vital Signs: Temp: 97.4 F (36.3 C) (06/11 0407) BP: 93/68 mmHg (06/11 1000) Pulse Rate: 92 (06/11 1000) Intake/Output from previous day:   Intake/Output from this shift:    Labs:  Recent Labs  03/29/15 0430 03/30/15 0500 03/31/15 0430  WBC  --  9.4 24.6*  HGB  --  10.1* 11.4*  PLT  --  226 267  CREATININE 2.29* 2.31* 2.52*   Estimated Creatinine Clearance: 14.9 mL/min (by C-G formula based on Cr of 2.52). No results for input(s): VANCOTROUGH, VANCOPEAK, VANCORANDOM, GENTTROUGH, GENTPEAK, GENTRANDOM, TOBRATROUGH, TOBRAPEAK, TOBRARND, AMIKACINPEAK, AMIKACINTROU, AMIKACIN in the last 72 hours.   Microbiology: Recent Results (from the past 720 hour(s))  Culture, blood (routine x 2)     Status: None   Collection Time: 03/16/15  4:18 PM  Result Value Ref Range Status   Specimen Description BLOOD LEFT WRIST  Final   Special Requests BOTTLES DRAWN AEROBIC ONLY 3CC  Final   Culture   Final    NO GROWTH 5 DAYS Performed at [facility]    Report Status 03/23/2015 FINAL  Final  Culture, blood (routine x 2)     Status: None   Collection Time: 03/16/15  4:21 PM  Result Value Ref Range Status   Specimen Description BLOOD RIGHT ARM  Final   Special Requests BOTTLES DRAWN AEROBIC AND ANAEROBIC 3CC  Final   Culture   Final    NO GROWTH 5 DAYS Performed at [facility]    Report Status 03/23/2015 FINAL  Final  Urine culture     Status: None   Collection Time: 03/16/15  5:44 PM  Result Value Ref Range Status   Specimen Description URINE, CLEAN CATCH  Final   Special Requests NONE  Final   Colony Count   Final    >=100,000 COLONIES/ML Performed at [facility]    Culture   Final    ESCHERICHIA COLI Performed at [facility]    Report Status 03/19/2015 FINAL  Final   Organism ID, Bacteria ESCHERICHIA COLI  Final      Susceptibility   Escherichia coli - MIC*    AMPICILLIN >=32 RESISTANT Resistant     CEFAZOLIN <=4 SENSITIVE Sensitive     CEFTRIAXONE <=1 SENSITIVE Sensitive     CIPROFLOXACIN <=0.25 SENSITIVE Sensitive     GENTAMICIN <=1 SENSITIVE Sensitive     LEVOFLOXACIN <=0.12 SENSITIVE Sensitive     NITROFURANTOIN <=16 SENSITIVE Sensitive     TOBRAMYCIN <=1 SENSITIVE Sensitive     TRIMETH/SULFA <=20 SENSITIVE Sensitive     PIP/TAZO <=4 SENSITIVE Sensitive     * ESCHERICHIA COLI  MRSA PCR Screening     Status: None   Collection Time: 03/16/15  7:57 PM  Result Value Ref Range Status   MRSA by PCR NEGATIVE NEGATIVE Final    Comment:        The GeneXpert MRSA Assay (FDA approved for NASAL specimens only), is one component of a comprehensive MRSA colonization surveillance program. It is not intended to diagnose MRSA infection nor to guide or monitor treatment for MRSA infections.     Medical History: Past Medical History  Diagnosis Date  .  Peripheral artery disease   . Hyperlipidemia        . Arthritis   . Aortic stenosis      a. s/p AVR  . BUNDLE BRANCH BLOCK, LEFT    . CAROTID ARTERY STENOSIS 01/05/2009  . CVA 03/14/2010  . PERIPHERAL VASCULAR DISEASE 07/30/2007  . Chronic combined systolic and diastolic CHF (congestive heart failure)     a. RHC 09/2011 RA 8, RV 58/2/11; PA 54/22 (37) PCWP 20; F CO/CI 4.57/2.67 PVR 3.7; b. L main no sig dz, LAD luminal irreg; LCx lg Ramus with luminal irreg, small AV Lcx witout sig dz, RCA luminal irreg; severe mitral annular calcifications; AV calcified c. EF 45-50% (07/2013);  d. 11/2014 Echo: EF 20-25%, diff HK, nl AV/MV, sev dil LA, mod TR/PR, PASP .  . Diabetes mellitus   . HTN (hypertension)   . Iron deficiency anemia 09/21/2011  . History of shingles   . Mitral  regurgitation     a. s/p MVR  . S/P aortic valve replacement with bioprosthetic valve 06/15/2013    21 mm [facility] Ease bovine pericardial tissue valve  . S/P mitral valve replacement with bioprosthetic valve 06/15/2013    25 mm [facility] Mitral bovine pericardial tissue valve  . Fracture, fibula, shaft 01/30/2015    right    Admit Complaint:  Hypoglycemia . CBG 20 at nursing facility  Anticoagulation Eliquis at SNF for Afib 2.5mg  BID   Infectious Disease : Sepsis. Source pulmonary vs UTI  Cardiovascular CHF on home milrinone, Htn, PVD, Afib, s/p bAVR/bMVR. PPM 03/21/15. ProBNP 3300. Troponin 0.06   Endocrinology DM   Gastrointestinal / Nutrition   Neurology:   Nephrology CKD. Scr 2.52 with current CrCl 15   Pulmonary   Hematology / Oncology   PTA Medication Issues   Best Practices    Goal of Therapy:  Vancomycin trough level 15-20 mcg/ml  Plan:  Vanco 1g IV q72h  Zosyn3.375g IV x 1 then 2.25g IV q8hr  Jaymere Alen S. Merilynn Finland, PharmD, BCPS Clinical Staff Pharmacist Pager [phone]  Misty Stanley Stillinger 03/31/2015,10:21 AM

## 2015-04-01 LAB — BASIC METABOLIC PANEL
Anion gap: 11 (ref 5–15)
BUN: 57 mg/dL — ABNORMAL HIGH (ref 6–20)
CALCIUM: 8.6 mg/dL — AB (ref 8.9–10.3)
CHLORIDE: 95 mmol/L — AB (ref 101–111)
CO2: 30 mmol/L (ref 22–32)
Creatinine, Ser: 2.21 mg/dL — ABNORMAL HIGH (ref 0.44–1.00)
GFR calc non Af Amer: 20 mL/min — ABNORMAL LOW (ref >60)
GFR, EST AFRICAN AMERICAN: 23 mL/min — AB (ref >60)
Glucose, Bld: 110 mg/dL — ABNORMAL HIGH (ref 65–99)
Potassium: 3.4 mmol/L — ABNORMAL LOW (ref 3.5–5.1)
Sodium: 136 mmol/L (ref 135–145)

## 2015-04-01 LAB — GLUCOSE, CAPILLARY
GLUCOSE-CAPILLARY: 103 mg/dL — AB (ref 65–99)
Glucose-Capillary: 109 mg/dL — ABNORMAL HIGH (ref 65–99)
Glucose-Capillary: 178 mg/dL — ABNORMAL HIGH (ref 65–99)
Glucose-Capillary: 221 mg/dL — ABNORMAL HIGH (ref 65–99)
Glucose-Capillary: 221 mg/dL — ABNORMAL HIGH (ref 65–99)

## 2015-04-01 LAB — URINE CULTURE
COLONY COUNT: NO GROWTH
CULTURE: NO GROWTH

## 2015-04-01 LAB — HIV ANTIBODY (ROUTINE TESTING W REFLEX): HIV Screen 4th Generation wRfx: NONREACTIVE

## 2015-04-01 MED ORDER — POTASSIUM CHLORIDE CRYS ER 20 MEQ PO TBCR
20.0000 meq | EXTENDED_RELEASE_TABLET | Freq: Once | ORAL | Status: AC
  Administered 2015-04-01: 20 meq via ORAL
  Filled 2015-04-01: qty 1

## 2015-04-01 MED ORDER — INSULIN ASPART 100 UNIT/ML ~~LOC~~ SOLN
0.0000 [IU] | Freq: Three times a day (TID) | SUBCUTANEOUS | Status: DC
  Administered 2015-04-02: 1 [IU] via SUBCUTANEOUS
  Administered 2015-04-02: 3 [IU] via SUBCUTANEOUS
  Administered 2015-04-02: 2 [IU] via SUBCUTANEOUS
  Administered 2015-04-03: 1 [IU] via SUBCUTANEOUS
  Administered 2015-04-03: 3 [IU] via SUBCUTANEOUS
  Administered 2015-04-03 – 2015-04-04 (×2): 1 [IU] via SUBCUTANEOUS
  Administered 2015-04-04 – 2015-04-05 (×3): 2 [IU] via SUBCUTANEOUS
  Administered 2015-04-05: 3 [IU] via SUBCUTANEOUS
  Administered 2015-04-05 – 2015-04-06 (×4): 2 [IU] via SUBCUTANEOUS
  Administered 2015-04-07: 1 [IU] via SUBCUTANEOUS
  Administered 2015-04-07: 3 [IU] via SUBCUTANEOUS
  Administered 2015-04-08 – 2015-04-09 (×3): 2 [IU] via SUBCUTANEOUS

## 2015-04-01 NOTE — Progress Notes (Signed)
[place] TEAM 1 - Stepdown/ICU TEAM Progress Note  Joanna [name] ZOX:[id number] DOB: 02/03/1930 DOA: 03/31/2015 PCP: Sanda Linger, MD  Admit HPI / Brief Narrative: 81yo F with chronic systolic congestive heart failure on chronic milrinone tx (TTE April 2016 EF 20-25%), atrial fibrillation, AVR/MVR 06/15/2013, hypertension, DM2, dyslipidemia, hospitalization for decompensated CHF from 03/16/2015 through 03/30/2015 during which time she had pacemaker placement who presented to [facility] ER from her SNF with CBGs in 20s. She had reports of lightheadedness, nausea but no vomiting. When she was given glucagon and dextrose and her symptoms improved.  In ED blood pressure was 83/49, heart rate 55, respiratory rate 12-21, afebrile and oxygen saturation 94% with nasal cannula oxygen support. Blood work was notable for white blood cell count of 24.6, hemoglobin 11.4, potassium 3.2 and creatinine 2.52. Troponin level was 0.06. 12 lead EKG showed underlying atrial fibrillation and left bundle branch block unchanged from previous tracings. CXR showed stable cardiomegaly, increased vascular congestion, possible pulmonary edema and increasing left middling consolidation possible edema or subcutaneous hematoma from recent pacemaker placement.   HPI/Subjective: The patient is sitting up in a bedside chair and looks quite comfortable.  She does admit to a hacking cough but denies chest pain fevers chills nausea vomiting or abdominal pain.  She tells me she "feels better than I have in a while."  Assessment/Plan:  Sepsis due to ? L mid lung HCAP Clinical exam is not terribly impressive for a significant acute infection - clinically the patient is stabilizing - continue empiric antibiotics for now - follow procalcitonin trend  Hypotension  Blood pressure has stabilized this time  Acute respiratory failure with hypoxia Patient's risk for status is stable with sats at 94% or greater on room air at present -  follow  Severe chronic nonischemic systolic CHF on chronic milrinone s/p [employer] CRT-D 03/21/15 CHF team to follow  Mod > severe Tricuspid regurgitation  Biprostheatic aortic and mitral valve replacement  Chronic LBBB  Hypokalemia Correct and follow trend  Hypoglycemia in DM2 CBG appears to be stabilizing - d/c Januvia given GFR ~20 - given her poor long term prognosis our goal should be to avoid CBG extremes, and not to attempt strict control   Troponin elevation Follow trend - no symptoms to suggest unstable angina pectoris   CKD stage 3 Baseline crt 2.4 -  creatinine stable  - cardiorenal syndrome   Chronic PAF CHADS vasc score 6 - continue eliquis - presently well controlled  Code Status: NO CODE BLUE / DNR Family Communication: no family present at time of exam Disposition Plan: SDU  Consultants: CHF team   Procedures: None   Antibiotics: Zosyn 6/11 > Vancomycin 6/11 >  DVT prophylaxis: Apixaban  Objective: Blood pressure 99/62, pulse 89, temperature 97.8 F (36.6 C), temperature source Oral, resp. rate 19, height  (1.626 m), weight 60.8 kg (134 lb 0.6 oz), SpO2 93 %.  Intake/Output Summary (Last 24 hours) at 04/01/15 1142 Last data filed at 04/01/15 1100  Gross per 24 hour  Intake    649 ml  Output   1450 ml  Net   -801 ml   Exam: General: No acute respiratory distress - alert and conversant  Lungs: Clear to auscultation bilaterally without wheezes or crackles Cardiovascular: Regular rate without murmur gallop or rub normal S1 and S2 Abdomen: Nontender, nondistended, soft, bowel sounds positive, no rebound, no ascites, no appreciable mass Extremities: No significant cyanosis, clubbing, or edema bilateral lower extremities  Data  Reviewed: Basic Metabolic Panel:  Recent Labs Lab 03/28/15 0440 03/29/15 0430 03/30/15 0500 03/31/15 0430 04/01/15 0400  NA 133* 133* 135 136 136  K 4.4 4.3 3.2* 3.2* 3.4*  CL 94* 94* 95* 94* 95*  CO2 31 28 30  29 30   GLUCOSE 87 132* 118* 148* 110*  BUN 56* 55* 50* 55* 57*  CREATININE 2.24* 2.29* 2.31* 2.52* 2.21*  CALCIUM 8.9 8.8* 9.1 9.2 8.6*    CBC:  Recent Labs Lab 03/27/15 0433 03/30/15 0500 03/31/15 0430  WBC 10.5 9.4 24.6*  NEUTROABS  --   --  21.9*  HGB 11.3* 10.1* 11.4*  HCT 36.0 31.7* 35.2*  MCV 94.5 93.8 94.9  PLT 236 226 267    Liver Function Tests:  Recent Labs Lab 03/31/15 0430  AST 44*  ALT 27  ALKPHOS 72  BILITOT 1.0  PROT 6.3*  ALBUMIN 3.2*    Recent Labs Lab 03/27/15 1506  AMMONIA 18   Cardiac Enzymes:  Recent Labs Lab 03/31/15 0430  TROPONINI 0.06*    CBG:  Recent Labs Lab 03/31/15 1643 03/31/15 1954 03/31/15 2326 04/01/15 0418 04/01/15 0729  GLUCAP 213* 158* 210* 109* 103*    Recent Results (from the past 240 hour(s))  Culture, blood (routine x 2)     Status: None (Preliminary result)   Collection Time: 03/31/15  6:40 AM  Result Value Ref Range Status   Specimen Description BLOOD PICC LINE RIGHT ARM  Final   Special Requests BOTTLES DRAWN AEROBIC AND ANAEROBIC 5CC  Final   Culture   Final           BLOOD CULTURE RECEIVED NO GROWTH TO DATE CULTURE WILL BE HELD FOR 5 DAYS BEFORE ISSUING A FINAL NEGATIVE REPORT Performed at [facility]    Report Status PENDING  Incomplete  Culture, blood (routine x 2)     Status: None (Preliminary result)   Collection Time: 03/31/15  7:36 AM  Result Value Ref Range Status   Specimen Description BLOOD LEFT HAND  Final   Special Requests BOTTLES DRAWN AEROBIC AND ANAEROBIC 5CC  Final   Culture   Final           BLOOD CULTURE RECEIVED NO GROWTH TO DATE CULTURE WILL BE HELD FOR 5 DAYS BEFORE ISSUING A FINAL NEGATIVE REPORT Performed at [facility]    Report Status PENDING  Incomplete     Studies:   Recent x-ray studies have been reviewed in detail by the Attending Physician  Scheduled Meds:  Scheduled Meds: . apixaban  2.5 mg Oral BID  . docusate sodium  100 mg Oral BID   . furosemide  40 mg Oral BID  . insulin aspart  0-9 Units Subcutaneous 6 times per day  . piperacillin-tazobactam (ZOSYN)  IV  2.25 g Intravenous Q8H  . potassium chloride SA  20 mEq Oral BID  . [START ON 04/03/2015] vancomycin  1,000 mg Intravenous Q72H    Time spent on care of this patient: 35 mins   Kayceon Oki T , MD   [facility]  [phone] Pager - Text Page per Loretha Stapler as per below:  On-Call/Text Page:      [email or web address]      password [email or web address]  If 7PM-7AM, please contact night-coverage [email or web address] Password [email or web address] 04/01/2015, 11:42 AM   LOS: 1 day

## 2015-04-02 ENCOUNTER — Inpatient Hospital Stay (HOSPITAL_COMMUNITY): Payer: Medicare Other

## 2015-04-02 ENCOUNTER — Telehealth: Payer: Self-pay | Admitting: Internal Medicine

## 2015-04-02 LAB — BASIC METABOLIC PANEL
ANION GAP: 9 (ref 5–15)
BUN: 52 mg/dL — ABNORMAL HIGH (ref 6–20)
CO2: 30 mmol/L (ref 22–32)
Calcium: 8.9 mg/dL (ref 8.9–10.3)
Chloride: 98 mmol/L — ABNORMAL LOW (ref 101–111)
Creatinine, Ser: 2.1 mg/dL — ABNORMAL HIGH (ref 0.44–1.00)
GFR calc Af Amer: 24 mL/min — ABNORMAL LOW (ref >60)
GFR, EST NON AFRICAN AMERICAN: 21 mL/min — AB (ref >60)
Glucose, Bld: 164 mg/dL — ABNORMAL HIGH (ref 65–99)
Potassium: 3.2 mmol/L — ABNORMAL LOW (ref 3.5–5.1)
SODIUM: 137 mmol/L (ref 135–145)

## 2015-04-02 LAB — GLUCOSE, CAPILLARY
Glucose-Capillary: 197 mg/dL — ABNORMAL HIGH (ref 65–99)
Glucose-Capillary: 128 mg/dL — ABNORMAL HIGH (ref 65–99)
Glucose-Capillary: 237 mg/dL — ABNORMAL HIGH (ref 65–99)
Glucose-Capillary: 210 mg/dL — ABNORMAL HIGH (ref 65–99)

## 2015-04-02 LAB — CBC
HEMATOCRIT: 32.1 % — AB (ref 36.0–46.0)
Hemoglobin: 10.2 g/dL — ABNORMAL LOW (ref 12.0–15.0)
MCH: 29.8 pg (ref 26.0–34.0)
MCHC: 31.8 g/dL (ref 30.0–36.0)
MCV: 93.9 fL (ref 78.0–100.0)
Platelets: 193 10*3/uL (ref 150–400)
RBC: 3.42 MIL/uL — ABNORMAL LOW (ref 3.87–5.11)
RDW: 16.1 % — ABNORMAL HIGH (ref 11.5–15.5)
WBC: 13.4 10*3/uL — AB (ref 4.0–10.5)

## 2015-04-02 LAB — VANCOMYCIN, RANDOM: Vancomycin Rm: 6 ug/mL

## 2015-04-02 LAB — TROPONIN I
TROPONIN I: 0.13 ng/mL — AB (ref <0.031)
Troponin I: 0.15 ng/mL — ABNORMAL HIGH (ref <0.031)

## 2015-04-02 LAB — MAGNESIUM: MAGNESIUM: 2 mg/dL (ref 1.7–2.4)

## 2015-04-02 LAB — LEGIONELLA ANTIGEN, URINE

## 2015-04-02 LAB — PROCALCITONIN: PROCALCITONIN: 3.52 ng/mL

## 2015-04-02 MED ORDER — POTASSIUM CHLORIDE CRYS ER 20 MEQ PO TBCR
40.0000 meq | EXTENDED_RELEASE_TABLET | Freq: Two times a day (BID) | ORAL | Status: DC
  Administered 2015-04-02 – 2015-04-09 (×15): 40 meq via ORAL
  Filled 2015-04-02 (×18): qty 2

## 2015-04-02 MED ORDER — ALTEPLASE 2 MG IJ SOLR
2.0000 mg | Freq: Once | INTRAMUSCULAR | Status: AC
  Administered 2015-04-03: 2 mg
  Filled 2015-04-02: qty 2

## 2015-04-02 MED ORDER — SODIUM CHLORIDE 0.9 % IJ SOLN
10.0000 mL | INTRAMUSCULAR | Status: DC | PRN
  Administered 2015-04-08 – 2015-04-09 (×2): 10 mL
  Filled 2015-04-02 (×2): qty 40

## 2015-04-02 MED ORDER — SODIUM CHLORIDE 0.9 % IV SOLN
INTRAVENOUS | Status: DC
  Administered 2015-04-07 – 2015-04-08 (×3): via INTRAVENOUS

## 2015-04-02 MED ORDER — POTASSIUM CHLORIDE CRYS ER 20 MEQ PO TBCR: 40.0000 meq | EXTENDED_RELEASE_TABLET | Freq: Two times a day (BID) | ORAL | Status: DC

## 2015-04-02 NOTE — Progress Notes (Signed)
Inpatient Diabetes Program Recommendations  AACE/ADA: New Consensus Statement on Inpatient Glycemic Control (2013)  Target Ranges:  Prepandial:   less than 140 mg/dL      Peak postprandial:   less than 180 mg/dL (1-2 hours)      Critically ill patients:  140 - 180 mg/dL   Results for Joanna [name], Joanna [name] (MRN [id number]) as of 04/02/2015 09:39  Ref. Range 04/01/2015 07:29 04/01/2015 11:23 04/01/2015 16:11 04/01/2015 22:00 04/02/2015 08:21  Glucose-Capillary Latest Ref Range: 65-99 mg/dL 388 (H) 875 (H) 797 (H) 221 (H) 197 (H)   Diabetes history: DM2 Outpatient Diabetes medications: Toujeo 20 units daily, Novolog 3 units TID with meals, Januvia 50 mg daily Current orders for Inpatient glycemic control: Novolog 0-9 units TID with meals  Inpatient Diabetes Program Recommendations Correction (SSI): Please consider adding Novolog bedtime correction scale. Insulin - Meal Coverage: If patient is eating at least 50% of meals, please consider ordering Novolog 3 units TID with meals since post prandial glucose is consistently elevated.  Thanks, Orlando Penner, RN, MSN, CCRN, CDE Diabetes Coordinator Inpatient Diabetes Program [phone] (Team Pager from 8am to 5pm) [phone] ([facility] office) [phone] [facility] office) [phone] [facility] office)

## 2015-04-02 NOTE — Progress Notes (Signed)
Virgilene continues to feel well today. Plans to walk with PT today.  Asked that I call her Daughter.  No other complaints. She is hopeful of getting back to rehab soon then home with daughter. Spoke with daughter today as well to update. I will shadow chart, but please feel free to contact me if more urgent concerns or questions.  Orvis Brill D.O. Palliative Medicine Team at [facility]  Pager: [phone] Team Phone: [phone]

## 2015-04-02 NOTE — Progress Notes (Signed)
[facility]  Patient Status: Active pt x 1 day prior to readmission  [facility] is providing the following services: Home/Outpatient  Infusion Pharmacy Services for Milrinone while resident at [facility]. [facility] hospital infusion coordinator will follow pt to support DC plan.   If patient discharges after hours, please call [phone].   Sedalia Muta 04/02/2015, 9:11 AM

## 2015-04-02 NOTE — Progress Notes (Addendum)
[place] TEAM 1 - Stepdown/ICU TEAM Progress Note  Joanna [name] ZOX:[id number] DOB: [birth year] DOA: 03/31/2015 PCP: Sanda Linger, MD  Admit HPI / Brief Narrative: 81yo F with chronic systolic congestive heart failure on chronic milrinone tx (TTE April 2016 EF 20-25%), atrial fibrillation, AVR/MVR 06/15/2013, hypertension, DM2, dyslipidemia, hospitalization for decompensated CHF from 03/16/2015 through 03/30/2015 during which time she had pacemaker placement who presented to [facility] ER from her SNF with CBGs in 20s. She had reports of lightheadedness, nausea but no vomiting. When she was given glucagon and dextrose her symptoms improved.  In ED blood pressure was 83/49, heart rate 55, respiratory rate 12-21, afebrile and oxygen saturation 94% with nasal cannula oxygen support. Blood work was notable for white blood cell count of 24.6, hemoglobin 11.4, potassium 3.2 and creatinine 2.52. Troponin level was 0.06. 12 lead EKG showed underlying atrial fibrillation and left bundle branch block unchanged from previous tracings. CXR showed stable cardiomegaly, increased vascular congestion, possible pulmonary edema and increasing left middling consolidation possible edema or subcutaneous hematoma from recent pacemaker placement.   HPI/Subjective: The patient is resting, and her bedside chair.  She is anxious to work with physical therapy.  She reports a good appetite.  She denies chest pain shortness breath fevers chills nausea vomiting or abdominal pain.  Assessment/Plan:  Sepsis due to ? L mid lung/basilar HCAP Clinical exam is not terribly impressive for a significant acute infection - chest x-ray confirms persisting atelectasis +/- infiltrate plus probable effusion left base - clinically the patient is stabilizing and her white blood cell count is declining - continue empiric antibiotics - follow procalcitonin trend - follow serial chest x-ray - consider CT chest to rule out loculations if chest  x-ray findings persist or if clinical signs suggesting slow improvement/response to treatment  Hypotension  Blood pressure has stabilized   Acute respiratory failure with hypoxia Patient's respiratory status is stable with sats at 94% or greater on room air at present   Severe chronic nonischemic systolic CHF on chronic milrinone s/p [employer] CRT-D 03/21/15 Clinically stable at the present time - CHF team aware of patient's presence and will visit for social call but presently CHF treatment appears to be stable  Mod > severe Tricuspid regurgitation  Biprostheatic aortic and mitral valve replacement  Chronic LBBB / biventricular pacemaker  Hypokalemia Potassium not yet at goal - continue to replace - check magnesium  Hypoglycemia in DM2 Hypoglycemia has resolved and CBGs are trending upward - d/c Januvia given GFR ~20 - given her poor long term prognosis our goal should be to avoid CBG extremes, and not to attempt strict control - follow without change today  Troponin elevation Troponin trending up but patient continues to deny chest pain - follow trend   CKD stage 3 Baseline crt 2.4 -  creatinine stable    Chronic PAF CHADS vasc score 6 - continue eliquis - presently well controlled  Code Status: NO CODE BLUE / DNR Family Communication: no family present at time of exam Disposition Plan: CHF Team informs me milrinone gtt can be admin on floor - transfer to CHF unit - PT/OT - potential discharge in approximately 48 hours if pulm status/CXR stable   Consultants: CHF team - social  Procedures: None   Antibiotics: Zosyn 6/11 > Vancomycin 6/11 >  DVT prophylaxis: Apixaban  Objective: Blood pressure 106/65, pulse 89, temperature 98.4 F (36.9 C), temperature source Oral, resp. rate 24, height  (1.626 m), weight 60.8 kg (134  lb 0.6 oz), SpO2 98 %.  Intake/Output Summary (Last 24 hours) at 04/02/15 0902 Last data filed at 04/02/15 4142  Gross per 24 hour  Intake   825.5 ml  Output   1350 ml  Net -524.5 ml   Exam: General: No acute respiratory distress - alert and conversant  Lungs: Clear to auscultation bilaterally without wheezes or crackles with exception to poor air movement in the left base Cardiovascular: Regular rate without gallop or rub - sequential clicks appreciable Abdomen: Nontender, nondistended, soft, bowel sounds positive, no rebound, no ascites, no appreciable mass Extremities: No significant cyanosis, clubbing, or edema bilateral lower extremities  Data Reviewed: Basic Metabolic Panel:  Recent Labs Lab 03/29/15 0430 03/30/15 0500 03/31/15 0430 04/01/15 0400 04/02/15 0520  NA 133* 135 136 136 137  K 4.3 3.2* 3.2* 3.4* 3.2*  CL 94* 95* 94* 95* 98*  CO2 28 30 29 30 30   GLUCOSE 132* 118* 148* 110* 164*  BUN 55* 50* 55* 57* 52*  CREATININE 2.29* 2.31* 2.52* 2.21* 2.10*  CALCIUM 8.8* 9.1 9.2 8.6* 8.9    CBC:  Recent Labs Lab 03/27/15 0433 03/30/15 0500 03/31/15 0430 04/02/15 0004  WBC 10.5 9.4 24.6* 13.4*  NEUTROABS  --   --  21.9*  --   HGB 11.3* 10.1* 11.4* 10.2*  HCT 36.0 31.7* 35.2* 32.1*  MCV 94.5 93.8 94.9 93.9  PLT 236 226 267 193    Liver Function Tests:  Recent Labs Lab 03/31/15 0430  AST 44*  ALT 27  ALKPHOS 72  BILITOT 1.0  PROT 6.3*  ALBUMIN 3.2*    Recent Labs Lab 03/27/15 1506  AMMONIA 18   Cardiac Enzymes:  Recent Labs Lab 03/31/15 0430 04/02/15 0520  TROPONINI 0.06* 0.15*    CBG:  Recent Labs Lab 04/01/15 0729 04/01/15 1123 04/01/15 1611 04/01/15 2200 04/02/15 0821  GLUCAP 103* 178* 221* 221* 197*    Recent Results (from the past 240 hour(s))  Culture, blood (routine x 2)     Status: None (Preliminary result)   Collection Time: 03/31/15  6:40 AM  Result Value Ref Range Status   Specimen Description BLOOD PICC LINE RIGHT ARM  Final   Special Requests BOTTLES DRAWN AEROBIC AND ANAEROBIC 5CC  Final   Culture   Final           BLOOD CULTURE RECEIVED NO GROWTH TO  DATE CULTURE WILL BE HELD FOR 5 DAYS BEFORE ISSUING A FINAL NEGATIVE REPORT Performed at [facility]    Report Status PENDING  Incomplete  Culture, blood (routine x 2)     Status: None (Preliminary result)   Collection Time: 03/31/15  7:36 AM  Result Value Ref Range Status   Specimen Description BLOOD LEFT HAND  Final   Special Requests BOTTLES DRAWN AEROBIC AND ANAEROBIC 5CC  Final   Culture   Final           BLOOD CULTURE RECEIVED NO GROWTH TO DATE CULTURE WILL BE HELD FOR 5 DAYS BEFORE ISSUING A FINAL NEGATIVE REPORT Performed at [facility]    Report Status PENDING  Incomplete  Urine culture     Status: None   Collection Time: 03/31/15 11:52 AM  Result Value Ref Range Status   Specimen Description URINE, RANDOM  Final   Special Requests NONE  Final   Colony Count NO GROWTH Performed at [facility]   Final   Culture NO GROWTH Performed at [facility]   Final  Report Status 04/01/2015 FINAL  Final     Studies:   Recent x-ray studies have been reviewed in detail by the Attending Physician  Scheduled Meds:  Scheduled Meds: . apixaban  2.5 mg Oral BID  . docusate sodium  100 mg Oral BID  . furosemide  40 mg Oral BID  . insulin aspart  0-9 Units Subcutaneous TID WC  . piperacillin-tazobactam (ZOSYN)  IV  2.25 g Intravenous Q8H  . potassium chloride SA  20 mEq Oral BID  . [START ON 04/03/2015] vancomycin  1,000 mg Intravenous Q72H    Time spent on care of this patient: 35 mins   MCCLUNG,JEFFREY T , MD   [facility] Office  [phone] Pager - Text Page per Loretha Stapler as per below:  On-Call/Text Page:      [email or web address]      password [email or web address]  If 7PM-7AM, please contact night-coverage [email or web address] Password [email or web address] 04/02/2015, 9:02 AM   LOS: 2 days

## 2015-04-02 NOTE — Evaluation (Signed)
Physical Therapy Evaluation Patient Details Name: Joanna [name] MRN: [id number] DOB: [birth year] Today's Date: 04/02/2015   History of Present Illness  Joanna [name] is a 81 y.o. female has a past medical history significant for chronic systolic and diastolic heart failure, ejection fraction 20-25%, prior AVR/MVR 8/14, type 2 diabetes mellitus, hypertension, hyperlipidemia, fall with right ankle fx s/p ORIF 4/13, last admission cardiogenic shock with pacemaker replacement 6/1. Cast removed 6/3 with pt now WBAT in CAM boot. Pt readmitted from [facility] with sepsis, HCAP  Clinical Impression  Pt very familiar from last 2 admissions. Pt remains pleasant and moving well. She continues to need supervision for all mobility due to lack of carry over for precautions and restrictions. Pt states she plans to move to dgtr's house and if those arrangements and supervision for mobility are in place she could D/C with dgtr and HHPT. If not then SNF still recommended. Pt again educated for need to wear CAM boot at all times OOB. Pt educated for transfers and gait and will continue to benefit from acute therapy to maximize mobility, balance and gait to decrease burden of care and address all below deficits.     Follow Up Recommendations Supervision for mobility/OOB;SNF    Equipment Recommendations  None recommended by PT    Recommendations for Other Services       Precautions / Restrictions Precautions Precautions: Fall Required Braces or Orthoses: Other Brace/Splint Other Brace/Splint: CAM boot RLE with all OOB activity Restrictions RLE Weight Bearing: Weight bearing as tolerated      Mobility  Bed Mobility               General bed mobility comments: in recliner on arrival and end of session  Transfers Overall transfer level: Needs assistance   Transfers: Sit to/from Stand Sit to Stand: Min guard         General transfer comment: Verbal cues for hand  placement  Ambulation/Gait Ambulation/Gait assistance: Min guard Ambulation Distance (Feet): 350 Feet Assistive device: Rolling walker (2 wheeled) Gait Pattern/deviations: Step-through pattern;Decreased stride length;Trunk flexed   Gait velocity interpretation: Below normal speed for age/gender General Gait Details: cues for posture and position in RW, slow steady gait  Stairs            Wheelchair Mobility    Modified Rankin (Stroke Patients Only)       Balance Overall balance assessment: Needs assistance;History of Falls                                           Pertinent Vitals/Pain Pain Assessment: No/denies pain  HR 89-92    Home Living Family/patient expects to be discharged to:: Skilled nursing facility Living Arrangements: Alone Available Help at Discharge: Available PRN/intermittently;Family Type of Home: House Home Access: Stairs to enter Entrance Stairs-Rails: Can reach both   Home Layout: One level Home Equipment: Environmental consultant - 2 wheels;Cane - single point Additional Comments: pt states eventual plan is to live with dgtr but unable to state why she can't D/C home with dgtr    Prior Function Level of Independence: Needs assistance   Gait / Transfers Assistance Needed: supervision- min assist since cast removal  ADL's / Homemaking Assistance Needed: assist for bathing and dressing  Comments: pt with increased function since cast removal last admission     Hand Dominance        Extremity/Trunk  Assessment   Upper Extremity Assessment: Generalized weakness           Lower Extremity Assessment: Generalized weakness      Cervical / Trunk Assessment: Kyphotic  Communication   Communication: No difficulties  Cognition Arousal/Alertness: Awake/alert Behavior During Therapy: WFL for tasks assessed/performed Overall Cognitive Status: Within Functional Limits for tasks assessed       Memory: Decreased short-term memory               General Comments      Exercises        Assessment/Plan    PT Assessment Patient needs continued PT services  PT Diagnosis Difficulty walking;Generalized weakness   PT Problem List Decreased strength;Decreased cognition;Decreased activity tolerance;Decreased balance;Decreased knowledge of precautions;Decreased mobility  PT Treatment Interventions Functional mobility training;Therapeutic activities;Therapeutic exercise;Balance training;Patient/family education;Gait training;Stair training   PT Goals (Current goals can be found in the Care Plan section) Acute Rehab PT Goals Patient Stated Goal: walk and go to my dgtr's house PT Goal Formulation: With patient Time For Goal Achievement: 04/16/15 Potential to Achieve Goals: Good    Frequency Min 3X/week   Barriers to discharge Decreased caregiver support      Co-evaluation               End of Session Equipment Utilized During Treatment: Other (comment) (CAM boot) Activity Tolerance: Patient tolerated treatment well Patient left: in chair;with call bell/phone within reach Nurse Communication: Mobility status         Time: 1610-9604 PT Time Calculation (min) (ACUTE ONLY): 23 min   Charges:   PT Evaluation $Initial PT Evaluation Tier I: 1 Procedure PT Treatments $Gait Training: 8-22 mins   PT G CodesDelorse Lek 04/02/2015, 3:03 PM Delaney Meigs, PT [phone]

## 2015-04-02 NOTE — Progress Notes (Signed)
Pt arrived to floor. VSS, pt placed on heart monitor. Pt oriented to room and floor, call bell within reach and bed alarm on. Pt asked RN to place call to daughter, daughters number dialed and phone given to patient. Patient called RN to room crying stating she cant get daughter to answer and pt wants RN to call daughter. Call placed to daughter with no answer. Pt states she is nervous but does not take any medicine for anxiety. Pt given emotional support. Will continue to monitor. Huel Coventry, RN

## 2015-04-02 NOTE — Consult Note (Signed)
   [facility] CM Inpatient Consult   04/02/2015  Joanna [name] [birth year] [id number]   Patient recently signed up with [facility] Care Management services prior to last hospital discharge. [facility] Licensed CSW to follow up when patient returns to SNF. Will make inpatient RNCM aware.  Raiford Noble, MSN-Ed, RN,BSN [facility] Liaison [phone]

## 2015-04-02 NOTE — Telephone Encounter (Signed)
[employer] is calling form [employer] checking on the status of patient diabetic supply's, please advise [phone]

## 2015-04-03 ENCOUNTER — Inpatient Hospital Stay (HOSPITAL_COMMUNITY): Payer: Medicare Other

## 2015-04-03 DIAGNOSIS — I10 Essential (primary) hypertension: Secondary | ICD-10-CM

## 2015-04-03 DIAGNOSIS — I480 Paroxysmal atrial fibrillation: Secondary | ICD-10-CM

## 2015-04-03 DIAGNOSIS — D72829 Elevated white blood cell count, unspecified: Secondary | ICD-10-CM

## 2015-04-03 DIAGNOSIS — N183 Chronic kidney disease, stage 3 (moderate): Secondary | ICD-10-CM

## 2015-04-03 DIAGNOSIS — Z515 Encounter for palliative care: Secondary | ICD-10-CM

## 2015-04-03 DIAGNOSIS — N184 Chronic kidney disease, stage 4 (severe): Secondary | ICD-10-CM

## 2015-04-03 DIAGNOSIS — I5022 Chronic systolic (congestive) heart failure: Secondary | ICD-10-CM

## 2015-04-03 LAB — BASIC METABOLIC PANEL
Anion gap: 9 (ref 5–15)
BUN: 51 mg/dL — AB (ref 6–20)
CALCIUM: 9.3 mg/dL (ref 8.9–10.3)
CO2: 28 mmol/L (ref 22–32)
CREATININE: 2.14 mg/dL — AB (ref 0.44–1.00)
Chloride: 100 mmol/L — ABNORMAL LOW (ref 101–111)
GFR calc Af Amer: 24 mL/min — ABNORMAL LOW (ref >60)
GFR, EST NON AFRICAN AMERICAN: 20 mL/min — AB (ref >60)
Glucose, Bld: 151 mg/dL — ABNORMAL HIGH (ref 65–99)
Potassium: 4.1 mmol/L (ref 3.5–5.1)
Sodium: 137 mmol/L (ref 135–145)

## 2015-04-03 LAB — CBC
HCT: 32.4 % — ABNORMAL LOW (ref 36.0–46.0)
Hemoglobin: 10.3 g/dL — ABNORMAL LOW (ref 12.0–15.0)
MCH: 29.9 pg (ref 26.0–34.0)
MCHC: 31.8 g/dL (ref 30.0–36.0)
MCV: 94.2 fL (ref 78.0–100.0)
PLATELETS: 189 10*3/uL (ref 150–400)
RBC: 3.44 MIL/uL — AB (ref 3.87–5.11)
RDW: 16 % — ABNORMAL HIGH (ref 11.5–15.5)
WBC: 12.5 10*3/uL — ABNORMAL HIGH (ref 4.0–10.5)

## 2015-04-03 LAB — TROPONIN I: TROPONIN I: 0.11 ng/mL — AB (ref <0.031)

## 2015-04-03 LAB — GLUCOSE, CAPILLARY
Glucose-Capillary: 150 mg/dL — ABNORMAL HIGH (ref 65–99)
Glucose-Capillary: 141 mg/dL — ABNORMAL HIGH (ref 65–99)
Glucose-Capillary: 238 mg/dL — ABNORMAL HIGH (ref 65–99)
Glucose-Capillary: 174 mg/dL — ABNORMAL HIGH (ref 65–99)

## 2015-04-03 MED ORDER — ALTEPLASE 2 MG IJ SOLR
2.0000 mg | Freq: Once | INTRAMUSCULAR | Status: DC
  Filled 2015-04-03: qty 2

## 2015-04-03 MED ORDER — VANCOMYCIN HCL IN DEXTROSE 1-5 GM/200ML-% IV SOLN
1000.0000 mg | INTRAVENOUS | Status: DC
  Administered 2015-04-05: 1000 mg via INTRAVENOUS
  Filled 2015-04-03: qty 200

## 2015-04-03 NOTE — Consult Note (Signed)
Advanced Heart Failure Team Consult Note  Reason for Consultation: Advanced HF  HPI:    Patient seen at patient and family request.   Joanna [name] is a 81 year old woman with h/o severe valvular disease s/p AVR/MVR (06/15/2013), systolic HF due to NICM (4/16 ECHO EF 20-25%), CKD stage IV, diabetes mellitus type II, PAF, and peripheral artery disease. She was recently discharged from the hospital to [name] after treatment for advanced HF requiring milrinone support and upgrade to CRT-D. Extensive discussions were hd with Joanna [name] and Joanna [name] family and she was made DNR/DNI.   Unfortunately she was readmitted less than 24 hours later with severe hypoglycemia and possible aspiration PNA. She was treated with glucose and broad spectrum abx and is now much improved.   She remains on milrinone and fortunately Joanna [name] HF and renal function have been stable. She currently denies sob, orthopnea or PND. Joanna [name] edema has been well controlled however Joanna [name] weight is up 5-6 pounds since previous d/c 132->138. She is tearful about how sick she has become and afraid of dying and leaving Joanna [name] children.   Review of Systems: [y] = yes,  = no   General: Weight gain ; Weight loss ; Anorexia ; Fatigue Cove.Etienne ]; Fever ; Chills ; Weakness   Cardiac: Chest pain/pressure ; Resting SOB ; Exertional SOB ; Orthopnea ; Pedal Edema ; Palpitations ; Syncope ; Presyncope ; Paroxysmal nocturnal dyspnea[ ]   Pulmonary: Cough ; Wheezing[ ] ; Hemoptysis[ ] ; Sputum ; Snoring   GI: Vomiting[ ] ; Dysphagia[ ] ; Melena[ ] ; Hematochezia ; Heartburn[ ] ; Abdominal pain ; Constipation ; Diarrhea ; BRBPR   GU: Hematuria[ ] ; Dysuria ; Nocturia[ ]   Vascular: Pain in legs with walking ; Pain in feet with lying flat ; Non-healing sores ; Stroke ; TIA ; Slurred speech ;  Neuro: Headaches[ ] ; Vertigo[ ] ; Seizures[ ] ; Paresthesias[ ] ;Blurred vision ; Diplopia ; Vision changes    Ortho/Skin: Arthritis Cove.Etienne ]; Joint pain [ y]; Muscle pain ; Joint swelling ; Back Pain ; Rash   Psych: Depression[y ]; Anxiety[ ]   Heme: Bleeding problems ; Clotting disorders ; Anemia   Endocrine: Diabetes Cove.[place]ienne ]; Thyroid dysfunction[ ]   Home Medications Prior to Admission medications   Medication Sig Start Date End Date Taking? Authorizing Provider  acetaminophen (TYLENOL) 325 MG tablet Take 325 mg by mouth every 6 (six) hours as needed for mild pain.   Yes Historical Provider, MD  amiodarone (PACERONE) 200 MG tablet Take 1 tablet (200 mg total) by mouth 2 (two) times daily. 03/30/15  Yes Graciella Freer, PA-C  apixaban (ELIQUIS) 2.5 MG TABS tablet Take 1 tablet (2.5 mg total) by mouth 2 (two) times daily. 03/30/15  Yes Graciella Freer, PA-C  calcium citrate-vitamin D (CITRACAL+D) 315-200 MG-UNIT per tablet Take 1 tablet by mouth daily.   Yes Historical Provider, MD  docusate sodium (COLACE) 100 MG capsule Take 100 mg by mouth 2 (two) times daily.     Yes Historical Provider, MD  ferrous sulfate 325 (65 FE) MG tablet Take 1 tablet (325 mg total) by mouth 2 (two) times daily with a meal. 02/28/14  Yes Etta Grandchild, MD  furosemide (LASIX) 40 MG tablet Take 1.5 tablets (60 mg total) by mouth 2 (two) times daily.  03/30/15  Yes Graciella Freer, PA-C  hydrALAZINE (APRESOLINE) 10 MG tablet Take 1 tablet (10 mg total) by mouth every 8 (eight) hours. 03/30/15  Yes Graciella Freer, PA-C  HYDROcodone-acetaminophen (NORCO/VICODIN) 5-325 MG per tablet Take 1-2 tablets by mouth every 4 (four) hours as needed (breakthrough pain). 02/02/15  Yes Jerald Kief, MD  insulin aspart (NOVOLOG) 100 UNIT/ML FlexPen Inject 3 Units into the skin 3 (three) times daily with meals. 02/02/15  Yes Jerald Kief, MD  Insulin Glargine (TOUJEO SOLOSTAR) 300 UNIT/ML SOPN Inject 20 Units into the skin daily. 02/02/15  Yes Jerald Kief, MD  Insulin Pen Needle (NOVOFINE) 32G X 6 MM  MISC Use daily with insulin 12/29/14  Yes Etta Grandchild, MD  isosorbide mononitrate (IMDUR) 30 MG 24 hr tablet Take 0.5 tablets (15 mg total) by mouth daily. 03/30/15  Yes Mariam Dollar Tillery, PA-C  JANUVIA 50 MG tablet Take 50 mg by mouth daily. 10/28/14  Yes Historical Provider, MD  metolazone (ZAROXOLYN) 5 MG tablet Take 1 tablet (5 mg total) by mouth See admin instructions. Take as needed for 3 lb weight gain over discharge weight of 132 lbs. Give prior to Lasix. 03/30/15  Yes Graciella Freer, PA-C  milrinone [facility]) 20 MG/100ML SOLN infusion Inject 15.475 mcg/min into the vein continuous. 03/30/15  Yes Mariam Dollar Tillery, PA-C  ondansetron (ZOFRAN) 4 MG tablet Take 4 mg by mouth every 4 (four) hours as needed for nausea or vomiting.   Yes Historical Provider, MD  potassium chloride SA (K-DUR,KLOR-CON) 20 MEQ tablet Take 1 tablet (20 mEq total) by mouth 2 (two) times daily. 03/30/15  Yes Graciella Freer, PA-C  promethazine (PHENERGAN) 25 MG tablet Take 25 mg by mouth every 12 (twelve) hours as needed for nausea or vomiting.   Yes Historical Provider, MD    Past Medical History: Past Medical History  Diagnosis Date  . Peripheral artery disease   . Hyperlipidemia        . Arthritis   . Aortic stenosis      a. s/p AVR  . BUNDLE BRANCH BLOCK, LEFT    . CAROTID ARTERY STENOSIS 01/05/2009  . CVA 03/14/2010  . PERIPHERAL VASCULAR DISEASE 07/30/2007  . Chronic combined systolic and diastolic CHF (congestive heart failure)     a. RHC 09/2011 RA 8, RV 58/2/11; PA 54/22 (37) PCWP 20; F CO/CI 4.57/2.67 PVR 3.7; b. L main no sig dz, LAD luminal irreg; LCx lg Ramus with luminal irreg, small AV Lcx witout sig dz, RCA luminal irreg; severe mitral annular calcifications; AV calcified c. EF 45-50% (07/2013);  d. 11/2014 Echo: EF 20-25%, diff HK, nl AV/MV, sev dil LA, mod TR/PR, PASP .  . Diabetes mellitus   . HTN (hypertension)   . Iron deficiency anemia 09/21/2011  . History of  shingles   . Mitral regurgitation     a. s/p MVR  . S/P aortic valve replacement with bioprosthetic valve 06/15/2013    21 mm [facility] Ease bovine pericardial tissue valve  . S/P mitral valve replacement with bioprosthetic valve 06/15/2013    25 mm [facility] Mitral bovine pericardial tissue valve  . Fracture, fibula, shaft 01/30/2015    right    Past Surgical History: Past Surgical History  Procedure Laterality Date  . Carotid endarterectomy Left 2011  . Polypectomy  12/27/04  . Cataract extraction Bilateral 2009  . Tee without cardioversion N/A 05/19/2013    Procedure: TRANSESOPHAGEAL ECHOCARDIOGRAM (TEE);  Surgeon: Freida Busman  Alford Highland, MD;  Location: [facility] ENDOSCOPY;  Service: Cardiovascular;  Laterality: N/A;  . Colonoscopy    . Cardiac catheterization  2012  . Aortic valve replacement N/A 06/15/2013    Procedure: AORTIC VALVE REPLACEMENT (AVR);  Surgeon: Purcell Nails, MD;  Location: [facility] OR;  Service: Open Heart Surgery;  Laterality: N/A;  . Intraoperative transesophageal echocardiogram N/A 06/15/2013    Procedure: INTRAOPERATIVE TRANSESOPHAGEAL ECHOCARDIOGRAM;  Surgeon: Purcell Nails, MD;  Location: [facility] OR;  Service: Open Heart Surgery;  Laterality: N/A;  . Mitral valve replacement N/A 06/15/2013    Procedure: MITRAL VALVE (MV) REPLACEMENT;  Surgeon: Purcell Nails, MD;  Location: [facility] OR;  Service: Open Heart Surgery;  Laterality: N/A;  . Epicardial pacing lead placement N/A 06/15/2013    Procedure: EPICARDIAL PACING LEAD PLACEMENT;  Surgeon: Purcell Nails, MD;  Location: [facility] OR;  Service: Open Heart Surgery;  Laterality: N/A;  . Left and right heart catheterization with coronary angiogram N/A 09/19/2011    Procedure: LEFT AND RIGHT HEART CATHETERIZATION WITH CORONARY ANGIOGRAM;  Surgeon: Dolores Patty, MD;  Location: [facility] CATH LAB;  Service: Cardiovascular;  Laterality: N/A;  . Orif ankle fracture Right 01/31/2015    Procedure: OPEN REDUCTION INTERNAL FIXATION (ORIF) ANKLE  FRACTURE;  Surgeon: Eldred Manges, MD;  Location: [facility] OR;  Service: Orthopedics;  Laterality: Right;  . Ep implantable device N/A 03/21/2015    Procedure: BiV Pacemaker Insertion CRT-P;  Surgeon: Duke Salvia, MD;  Location: [facility] INVASIVE CV LAB;  Service: Cardiovascular;  Laterality: N/A;  . Pacemaker insertion      Family History: Family History  Problem Relation Age of Onset  . Colon cancer Mother   . Diabetes Neg Hx   . Coronary artery disease Neg Hx     Social History: History   Social History  . Marital Status: Divorced    Spouse Name: N/A  . Number of Children: N/A  . Years of Education: N/A   Social History Main Topics  . Smoking status: Never Smoker   . Smokeless tobacco: Never Used  . Alcohol Use: No  . Drug Use: No  . Sexual Activity: No   Other Topics Concern  . None   Social History Narrative   Patient lives alone here in town   She continues to work, helping to bind and oversew books   She is divorced after 23 years of marriage   1 son- '61, minister; 1 dtr '55; 4 grandchildren   End of life care-provided packet on living well    Allergies:  Allergies  Allergen Reactions  . Other Hives    STEROIDS  . Prednisone Hives and Other (See Comments)    She is allergic to all steroids!    Objective:    Vital Signs:   Temp:  [97.5 F (36.4 C)-98.4 F (36.9 C)] 98.1 F (36.7 C) (06/14 2017) Pulse Rate:  [89-97] 97 (06/14 2017) Resp:  [17-18] 18 (06/14 2017) BP: (111-121)/(68-85) 119/71 mmHg (06/14 2017) SpO2:  [94 %-98 %] 97 % (06/14 2017) Weight:  [62.869 kg (138 lb 9.6 oz)] 62.869 kg (138 lb 9.6 oz) (06/14 0652) Last BM Date: 04/03/15  Weight change: Filed Weights   04/01/15 0500 04/02/15 1910 04/03/15 0652  Weight: 60.8 kg (134 lb 0.6 oz) 63.368 kg (139 lb 11.2 oz) 62.869 kg (138 lb 9.6 oz)    Intake/Output:   Intake/Output Summary (Last 24 hours) at 04/03/15 2345 Last data filed at 04/03/15 2253  Gross per 24  hour  Intake 1432.55 ml   Output    930 ml  Net 502.55 ml     Physical Exam: General: Elderly woman. Sitting in chair. NAD Neck: JVP 9-9, no thyromegaly or thyroid nodule.  Lungs: clear but decreased at bases CV: Nondisplaced PMI. Heart regular S1/S2, 2/6 SEM RUSB. No  edema.  Abdomen: Soft, nontender, no hepatosplenomegaly, no distention.  Neurologic: Alert and oriented x 3.  Psych: tearful at times Extremities: No clubbing or cyanosis.   TELEMETRY: Reviewed telemetry pt in NSR with v-pacing and occasional PVCs.   Labs: Basic Metabolic Panel:  Recent Labs Lab 03/30/15 0500 03/31/15 0430 04/01/15 0400 04/02/15 0520 04/02/15 1003 04/03/15 0420  NA 135 136 136 137  --  137  K 3.2* 3.2* 3.4* 3.2*  --  4.1  CL 95* 94* 95* 98*  --  100*  CO2 30 29 30 30   --  28  GLUCOSE 118* 148* 110* 164*  --  151*  BUN 50* 55* 57* 52*  --  51*  CREATININE 2.31* 2.52* 2.21* 2.10*  --  2.14*  CALCIUM 9.1 9.2 8.6* 8.9  --  9.3  MG  --   --   --   --  2.0  --     Liver Function Tests:  Recent Labs Lab 03/31/15 0430  AST 44*  ALT 27  ALKPHOS 72  BILITOT 1.0  PROT 6.3*  ALBUMIN 3.2*   No results for input(s): LIPASE, AMYLASE in the last 168 hours. No results for input(s): AMMONIA in the last 168 hours.  CBC:  Recent Labs Lab 03/30/15 0500 03/31/15 0430 04/02/15 0004 04/03/15 0420  WBC 9.4 24.6* 13.4* 12.5*  NEUTROABS  --  21.9*  --   --   HGB 10.1* 11.4* 10.2* 10.3*  HCT 31.7* 35.2* 32.1* 32.4*  MCV 93.8 94.9 93.9 94.2  PLT 226 267 193 189    Cardiac Enzymes:  Recent Labs Lab 03/31/15 0430 04/02/15 0520 04/02/15 1330 04/03/15 0420  TROPONINI 0.06* 0.15* 0.13* 0.11*    BNP: BNP (last 3 results)  Recent Labs  03/13/15 1028 03/16/15 1619 03/31/15 0430  BNP 4332.8* >4500.0* 3300.6*    ProBNP (last 3 results) No results for input(s): PROBNP in the last 8760 hours.   CBG:  Recent Labs Lab 04/02/15 2110 04/03/15 0610 04/03/15 1059 04/03/15 1623 04/03/15 2059   GLUCAP 210* 150* 141* 238* 174*    Coagulation Studies: No results for input(s): LABPROT, INR in the last 72 hours.  Other results:  Imaging: Dg Chest Port 1 View  04/03/2015   CLINICAL DATA:  Line placement.  PICC placement.  EXAM: PORTABLE CHEST - 1 VIEW  COMPARISON:  04/03/2015.  0917 hours  FINDINGS: RIGHT upper extremity PICC is present. The tip appears at the junction of the superior vena cava and RIGHT atrium.  Unchanged cardiomegaly. LEFT pleural effusion. Valvular replacement and median sternotomy. Aortic arch atherosclerosis. Mildly improved aeration at the RIGHT lung base.  IMPRESSION: RIGHT upper extremity PICC tip is at the junction of the superior vena cava and RIGHT atrium.   Electronically Signed   By: Andreas Newport M.D.   On: 04/03/2015 19:23   Dg Chest Port 1 View  04/03/2015   CLINICAL DATA:  81 year old female status post PICC line repositioning. Initial encounter.  EXAM: PORTABLE CHEST - 1 VIEW  COMPARISON:  0605 hours today and earlier.  FINDINGS: Portable AP semi upright view at 0917 hours.  On 04/02/2015 the right side PICC line tip  was evident at the cavoatrial junction region. The tip now appears to terminate at the level of the third from the top median sternotomy wire, about 2 cm above the carina.  Superimposed left chest 3 lead cardiac pacemaker, epicardial leads, cardiac prosthetic bowels, cardiomegaly, calcified atherosclerosis of the aorta. Stable ventilation. No pneumothorax.  IMPRESSION: 1. Right PICC line appears pulled back, tip now at the upper SVC level. 2. Otherwise stable chest.   Electronically Signed   By: Odessa Fleming M.D.   On: 04/03/2015 09:29   Dg Chest Port 1 View  04/03/2015   CLINICAL DATA:  Pneumonia.  EXAM: PORTABLE CHEST - 1 VIEW  COMPARISON:  04/02/2015, 03/31/2015, 03/21/2015 and 12/14/2014  FINDINGS: Chronic cardiomegaly. Aortic and mitral valve prostheses. Pacemaker. PICC unchanged.  Pulmonary vascularity is now normal. Large left pleural  effusion appears slightly improved with no residual infiltrate visible on the left. Probable small right effusion.  IMPRESSION: Slightly diminished left pleural effusion. Small right effusion, unchanged. No discrete infiltrates.   Electronically Signed   By: Francene Boyers M.D.   On: 04/03/2015 07:54   Dg Chest Port 1 View  04/02/2015   CLINICAL DATA:  Pneumonia.  EXAM: PORTABLE CHEST - 1 VIEW  COMPARISON:  03/31/2015.  FINDINGS: Right PICC line in stable position. Cardiac pacer in stable position. Aortic valve replacement. Cardiomegaly with mild pulmonary venous congestion. Mild interstitial prominence is present. Left lower lobe infiltrate and/or atelectasis present. Left pleural effusion cannot be excluded. No pneumothorax. P  IMPRESSION: 1. Right PICC line in stable position. 2. Cardiac pacer in stable position. Aortic valve replacement. Cardiomegaly with persistent mild interstitial prominence suggesting mild congestive heart failure. 3. Prominent left lower lobe atelectasis and/or infiltrate. Left pleural effusion.   Electronically Signed   By: Maisie Fus  Register   On: 04/02/2015 07:24      Medications:     Current Medications: . apixaban  2.5 mg Oral BID  . docusate sodium  100 mg Oral BID  . furosemide  40 mg Oral BID  . insulin aspart  0-9 Units Subcutaneous TID WC  . piperacillin-tazobactam (ZOSYN)  IV  2.25 g Intravenous Q8H  . potassium chloride SA  40 mEq Oral BID  . [START ON 04/05/2015] vancomycin  1,000 mg Intravenous Q48H     Infusions: . sodium chloride 10 mL/hr at 04/02/15 1609  . milrinone 0.25 mcg/kg/min (04/02/15 2304)      Assessment:   1. Hypoglycemic event 2. Possible aspiration PNA 3. Chronic systolic HF requiring home milrinone. EF 25%. S/p CRT-D upgrade 5/16 4. PAF 5. Bioprosthetic MV/AoV 6. Depression  7. CKD, stage IV 8. DNR/DNI   Plan/Discussion:     Joanna [name] HF is overall very stable on milrinone and current diuretic regimen. Would not make any  changes at this point. That said Joanna [name] weight is up slightly and if continues to trend up can give one dose of IV lasix tomorrow prior to d/c to e [name]. Agree with stopping Januvia. Will make arrangements for near f/u in HF Clinic. Continue apixaban for PAF.     Length of Stay: 3  Cephas Revard MD 04/03/2015, 11:45 PM  Advanced Heart Failure Team Pager [phone] (M-F; 7a - 4p)  Please contact [facility] for night-coverage after hours (4p -7a ) and weekends on [email or web address]

## 2015-04-03 NOTE — Progress Notes (Signed)
Pt IV pump states tubing occluded. IV team notified and new order to give alteplase for clotting of lumen. IV team paged when medicine arrived to floor. Pt notified. Pt states she cannot sleep until this is fixed. Pt states she does not need anything else at this time. Will continue to monitor. Huel Coventry, RN

## 2015-04-03 NOTE — Progress Notes (Signed)
Peripherally Inserted Central Catheter/Midline Placement  The IV Nurse has discussed with the patient and/or persons authorized to consent for the patient, the purpose of this procedure and the potential benefits and risks involved with this procedure.  The benefits include less needle sticks, lab draws from the catheter and patient may be discharged home with the catheter.  Risks include, but not limited to, infection, bleeding, blood clot (thrombus formation), and puncture of an artery; nerve damage and irregular heat beat.  Alternatives to this procedure were also discussed.  PICC/Midline Placement Documentation  PICC Triple Lumen 03/20/15 PICC Right Basilic 37 cm 1 cm (Active)  Indication for Insertion or Continuance of Line Vasoactive infusions 04/02/2015  8:00 AM  Exposed Catheter (cm) 8 cm 04/03/2015  3:41 PM  Site Assessment Clean;Dry;Intact 04/03/2015  3:41 PM  Lumen #1 Status Infusing;Flushed 04/03/2015  3:41 PM  Lumen #2 Status Infusing;Flushed 04/03/2015  3:41 PM  Lumen #3 Status Saline locked;Flushed;Blood return noted 04/03/2015  3:41 PM  Dressing Type Transparent 04/03/2015  3:41 PM  Dressing Status Clean;Dry;Intact;Antimicrobial disc in place 04/03/2015  3:41 PM  Line Care Connections checked and tightened 04/03/2015  9:00 AM  Dressing Intervention Dressing changed 04/03/2015  3:41 PM  Dressing Change Due 04/10/15 04/03/2015  3:41 PM       Lisabeth Devoid 04/03/2015, 6:05 PM Consent for PICC exchange obtained by Stacie Glaze, RN

## 2015-04-03 NOTE — Progress Notes (Signed)
ANTIBIOTIC CONSULT NOTE   Pharmacy Consult for Vanco/Zosyn Indication: Sepsis/PNA  Allergies  Allergen Reactions  . Other Hives    STEROIDS  . Prednisone Hives and Other (See Comments)    She is allergic to all steroids!    Labs:  Recent Labs  04/01/15 0400 04/02/15 0004 04/02/15 0520 04/03/15 0420  WBC  --  13.4*  --  12.5*  HGB  --  10.2*  --  10.3*  PLT  --  193  --  189  CREATININE 2.21*  --  2.10* 2.14*   Estimated Creatinine Clearance: 17.5 mL/min (by C-G formula based on Cr of 2.14).  Recent Labs  04/02/15 1420  VANCORANDOM 6     Microbiology: Recent Results (from the past 720 hour(s))  Culture, blood (routine x 2)     Status: None   Collection Time: 03/16/15  4:18 PM  Result Value Ref Range Status   Specimen Description BLOOD LEFT WRIST  Final   Special Requests BOTTLES DRAWN AEROBIC ONLY 3CC  Final   Culture   Final    NO GROWTH 5 DAYS Performed at [facility]    Report Status 03/23/2015 FINAL  Final  Culture, blood (routine x 2)     Status: None   Collection Time: 03/16/15  4:21 PM  Result Value Ref Range Status   Specimen Description BLOOD RIGHT ARM  Final   Special Requests BOTTLES DRAWN AEROBIC AND ANAEROBIC 3CC  Final   Culture   Final    NO GROWTH 5 DAYS Performed at [facility]    Report Status 03/23/2015 FINAL  Final  Urine culture     Status: None   Collection Time: 03/16/15  5:44 PM  Result Value Ref Range Status   Specimen Description URINE, CLEAN CATCH  Final   Special Requests NONE  Final   Colony Count   Final    >=100,000 COLONIES/ML Performed at [facility]    Culture   Final    ESCHERICHIA COLI Performed at [facility]    Report Status 03/19/2015 FINAL  Final   Organism ID, Bacteria ESCHERICHIA COLI  Final      Susceptibility   Escherichia coli - MIC*    AMPICILLIN >=32 RESISTANT Resistant     CEFAZOLIN <=4 SENSITIVE Sensitive     CEFTRIAXONE <=1 SENSITIVE Sensitive    CIPROFLOXACIN <=0.25 SENSITIVE Sensitive     GENTAMICIN <=1 SENSITIVE Sensitive     LEVOFLOXACIN <=0.12 SENSITIVE Sensitive     NITROFURANTOIN <=16 SENSITIVE Sensitive     TOBRAMYCIN <=1 SENSITIVE Sensitive     TRIMETH/SULFA <=20 SENSITIVE Sensitive     PIP/TAZO <=4 SENSITIVE Sensitive     * ESCHERICHIA COLI  MRSA PCR Screening     Status: None   Collection Time: 03/16/15  7:57 PM  Result Value Ref Range Status   MRSA by PCR NEGATIVE NEGATIVE Final    Comment:        The GeneXpert MRSA Assay (FDA approved for NASAL specimens only), is one component of a comprehensive MRSA colonization surveillance program. It is not intended to diagnose MRSA infection nor to guide or monitor treatment for MRSA infections.   Culture, blood (routine x 2)     Status: None (Preliminary result)   Collection Time: 03/31/15  6:40 AM  Result Value Ref Range Status   Specimen Description BLOOD PICC LINE RIGHT ARM  Final   Special Requests BOTTLES DRAWN AEROBIC AND ANAEROBIC 5CC  Final  Culture   Final           BLOOD CULTURE RECEIVED NO GROWTH TO DATE CULTURE WILL BE HELD FOR 5 DAYS BEFORE ISSUING A FINAL NEGATIVE REPORT Performed at [facility]    Report Status PENDING  Incomplete  Culture, blood (routine x 2)     Status: None (Preliminary result)   Collection Time: 03/31/15  7:36 AM  Result Value Ref Range Status   Specimen Description BLOOD LEFT HAND  Final   Special Requests BOTTLES DRAWN AEROBIC AND ANAEROBIC 5CC  Final   Culture   Final           BLOOD CULTURE RECEIVED NO GROWTH TO DATE CULTURE WILL BE HELD FOR 5 DAYS BEFORE ISSUING A FINAL NEGATIVE REPORT Performed at [facility]    Report Status PENDING  Incomplete  Urine culture     Status: None   Collection Time: 03/31/15 11:52 AM  Result Value Ref Range Status   Specimen Description URINE, RANDOM  Final   Special Requests NONE  Final   Colony Count NO GROWTH Performed at [facility]   Final   Culture NO  GROWTH Performed at [facility]   Final   Report Status 04/01/2015 FINAL  Final    Assessment: 81 year old female continues on Vancomycin and Zosyn for sepsis/pneumoia.   Afebrile, WBC stable Cultures negative Scr has improved  Goal of Therapy:  Vancomycin trough level 15-20 mcg/ml  Plan:  Increase vancomycin to 1 gram iv Q 48 hours  Continue Zosyn 2.25 grams iv Q 8 hours  Follow up for de-escalation of antibiotics.  Thank you. Okey Regal, PharmD [phone] 04/03/2015,10:26 AM

## 2015-04-03 NOTE — Progress Notes (Signed)
Progress Note  Joanna [name] ZHY:[id number] DOB: [birth year] DOA: 03/31/2015 PCP: Sanda Linger, MD  Admit HPI / Brief Narrative: 81yo F with chronic systolic congestive heart failure on chronic milrinone tx (TTE April 2016 EF 20-25%), atrial fibrillation, AVR/MVR 06/15/2013, hypertension, DM2, dyslipidemia, hospitalization for decompensated CHF from 03/16/2015 through 03/30/2015 during which time she had pacemaker placement who presented to [facility] ER from her SNF with CBGs in 20s. She had reports of lightheadedness, nausea but no vomiting. When she was given glucagon and dextrose her symptoms improved.  In ED blood pressure was 83/49, heart rate 55, respiratory rate 12-21, afebrile and oxygen saturation 94% with nasal cannula oxygen support. Blood work was notable for white blood cell count of 24.6, hemoglobin 11.4, potassium 3.2 and creatinine 2.52. Troponin level was 0.06. 12 lead EKG showed underlying atrial fibrillation and left bundle branch block unchanged from previous tracings. CXR showed stable cardiomegaly, increased vascular congestion, possible pulmonary edema and increasing left middling consolidation possible edema or subcutaneous hematoma from recent pacemaker placement.   HPI/Subjective: Denies any significant complaints. No shortness of breath, no cough, no fever or chills.  Assessment/Plan:  Sepsis due to ? L mid lung/basilar HCAP Clinical exam is not terribly impressive for a significant acute infection - chest x-ray confirms persisting atelectasis +/- infiltrate plus probable effusion left base  Clinically the patient is improving and her white blood cell count is declining - continue empiric antibiotics Procalcitonin, elevated, recheck in a.m. Urine and blood cultures no growth, chest x-ray showed no discrete infiltrates. Treated for suspected left sided HCAP.  Hypotension  Presented with blood pressure went down all the way to 86/56. This is resolved, could be  secondary to sepsis or milrinone.  Acute respiratory failure with hypoxia Patient's respiratory status is stable with sats at 94% or greater on room air at present   Severe chronic nonischemic systolic CHF on chronic milrinone s/p St Jude CRT-D 03/21/15 Clinically stable at the present time CHF team aware of patient's presence and will visit for social call but presently CHF treatment appears to be stable  Mod > severe Tricuspid regurgitation  Biprostheatic aortic and mitral valve replacement  Chronic LBBB / biventricular pacemaker  Hypokalemia Potassium not yet at goal - continue to replace - check magnesium  Hypoglycemia in DM2 Reported to have CBG in the 20s, treated with IV dextrose. Likely secondary to insulin, Januvia, plus the sepsis and low oral intake. Plans to discontinue Januvia, adjust insulin.  Troponin elevation Troponin trending up but patient continues to deny chest pain - follow trend   CKD stage 3 Baseline crt 2.4 -  creatinine stable    Chronic PAF CHADS vasc score 6 - continue eliquis - presently well controlled  Code Status: NO CODE BLUE / DNR Family Communication: no family present at time of exam Disposition Plan: CHF Team informs me milrinone gtt can be admin on floor - transfer to CHF unit - PT/OT - potential discharge in approximately 48 hours if pulm status/CXR stable   Consultants: CHF team - social  Procedures: None   Antibiotics: Zosyn 6/11 > Vancomycin 6/11 >  DVT prophylaxis: Apixaban  Objective: Blood pressure 111/68, pulse 89, temperature 98.4 F (36.9 C), temperature source Oral, resp. rate 17, height  (1.626 m), weight 62.869 kg (138 lb 9.6 oz), SpO2 97 %.  Intake/Output Summary (Last 24 hours) at 04/03/15 1041 Last data filed at 04/03/15 0843  Gross per 24 hour  Intake 1401.35 ml  Output  400 ml  Net 1001.35 ml   Exam: General: No acute respiratory distress - alert and conversant  Lungs: Clear to auscultation  bilaterally without wheezes or crackles with exception to poor air movement in the left base Cardiovascular: Regular rate without gallop or rub - sequential clicks appreciable Abdomen: Nontender, nondistended, soft, bowel sounds positive, no rebound, no ascites, no appreciable mass Extremities: No significant cyanosis, clubbing, or edema bilateral lower extremities  Data Reviewed: Basic Metabolic Panel:  Recent Labs Lab 03/30/15 0500 03/31/15 0430 04/01/15 0400 04/02/15 0520 04/02/15 1003 04/03/15 0420  NA 135 136 136 137  --  137  K 3.2* 3.2* 3.4* 3.2*  --  4.1  CL 95* 94* 95* 98*  --  100*  CO2 --  28  GLUCOSE 118* 148* 110* 164*  --  151*  BUN 50* 55* 57* 52*  --  51*  CREATININE 2.31* 2.52* 2.21* 2.10*  --  2.14*  CALCIUM 9.1 9.2 8.6* 8.9  --  9.3  MG  --   --   --   --  2.0  --     CBC:  Recent Labs Lab 03/30/15 0500 03/31/15 0430 04/02/15 0004 04/03/15 0420  WBC 9.4 24.6* 13.4* 12.5*  NEUTROABS  --  21.9*  --   --   HGB 10.1* 11.4* 10.2* 10.3*  HCT 31.7* 35.2* 32.1* 32.4*  MCV 93.8 94.9 93.9 94.2  PLT 226 267 193 189    Liver Function Tests:  Recent Labs Lab 03/31/15 0430  AST 44*  ALT 27  ALKPHOS 72  BILITOT 1.0  PROT 6.3*  ALBUMIN 3.2*    Recent Labs Lab 03/27/15 1506  AMMONIA 18   Cardiac Enzymes:  Recent Labs Lab 03/31/15 0430 04/02/15 0520 04/02/15 1330 04/03/15 0420  TROPONINI 0.06* 0.15* 0.13* 0.11*    CBG:  Recent Labs Lab 04/02/15 0821 04/02/15 1122 04/02/15 1615 04/02/15 2110 04/03/15 0610  GLUCAP 197* 128* 237* 210* 150*    Recent Results (from the past 240 hour(s))  Culture, blood (routine x 2)     Status: None (Preliminary result)   Collection Time: 03/31/15  6:40 AM  Result Value Ref Range Status   Specimen Description BLOOD PICC LINE RIGHT ARM  Final   Special Requests BOTTLES DRAWN AEROBIC AND ANAEROBIC 5CC  Final   Culture   Final           BLOOD CULTURE RECEIVED NO GROWTH TO DATE CULTURE  WILL BE HELD FOR 5 DAYS BEFORE ISSUING A FINAL NEGATIVE REPORT Performed at [facility]    Report Status PENDING  Incomplete  Culture, blood (routine x 2)     Status: None (Preliminary result)   Collection Time: 03/31/15  7:36 AM  Result Value Ref Range Status   Specimen Description BLOOD LEFT HAND  Final   Special Requests BOTTLES DRAWN AEROBIC AND ANAEROBIC 5CC  Final   Culture   Final           BLOOD CULTURE RECEIVED NO GROWTH TO DATE CULTURE WILL BE HELD FOR 5 DAYS BEFORE ISSUING A FINAL NEGATIVE REPORT Performed at [facility]    Report Status PENDING  Incomplete  Urine culture     Status: None   Collection Time: 03/31/15 11:52 AM  Result Value Ref Range Status   Specimen Description URINE, RANDOM  Final   Special Requests NONE  Final   Colony Count NO GROWTH Performed at [facility]   Final  Culture NO GROWTH Performed at [facility]   Final   Report Status 04/01/2015 FINAL  Final     Studies:   Recent x-ray studies have been reviewed in detail by the Attending Physician  Scheduled Meds:  Scheduled Meds: . apixaban  2.5 mg Oral BID  . docusate sodium  100 mg Oral BID  . furosemide  40 mg Oral BID  . insulin aspart  0-9 Units Subcutaneous TID WC  . piperacillin-tazobactam (ZOSYN)  IV  2.25 g Intravenous Q8H  . potassium chloride SA  40 mEq Oral BID  . [START ON 04/05/2015] vancomycin  1,000 mg Intravenous Q48H    Time spent on care of this patient: 35 mins   Gracy Ehly A , MD   [facility] Office  [phone] Pager - Text Page per Loretha Stapler as per below:  On-Call/Text Page:      [email or web address]      password [email or web address]  If 7PM-7AM, please contact night-coverage [email or web address] Password [email or web address] 04/03/2015, 10:41 AM   LOS: 3 days

## 2015-04-03 NOTE — Telephone Encounter (Signed)
Faxed to company

## 2015-04-03 NOTE — Progress Notes (Signed)
81 year old female- resident of [facility] where she was admitted from [facility] several days prior. She requires Milrinone at the SNF. CSW spoke to [place]- Admissions at [facility] who indicated that her family has held her bed at the SNF for one day. She stated that they would accept patient back when medically stable.  Fl2 initated and will be placed on chart for MD's signature.  Per discussion with Dr. Arthor Captain, d/c is anticipated back to SNF tomorrow if stable. Message left for Janie at [facility] re: this.  SW assessment initiated and will be completed in the a.m.   Lorri Frederick. Jaci Lazier, [place] [phone]

## 2015-04-03 NOTE — Clinical Social Work Note (Addendum)
Clinical Social Work Assessment  Patient Details  Name: Joanna [name] MRN: [id number] Date of Birth: 1930-07-03  Date of referral:  04/03/15               Reason for consult:  Other (Comment Required) (From a SNF- [facility])                Permission sought to share information with:    Permission granted to share information::     Name::        Agency::     Relationship::     Contact Information:     Housing/Transportation Living arrangements for the past 2 months:  Skilled Nursing Facility Source of Information:    Patient Patient Interpreter Needed:  None Criminal Activity/Legal Involvement Pertinent to Current Situation/Hospitalization:  No - Comment as needed Significant Relationships:  Adult Children Lives with:   SNF Do you feel safe going back to the place where you live?   Yes Need for family participation in patient care:  Yes (Comment)  Care giving concerns:  Patient relates that she had to return to the hospital after her blood sugars became elevated.  She states that she is feeling much better now.  Social Worker assessment / plan:  81 year old female admitted from [facility] where she had been placed at one day prior and is currently requiring Milrinone.  Patient requires short term SNF and plans to return home when stable.  Patient noted to be alert, oriented and extremely pleasant. She states that for the short time she was at the SNF- she felt she was well cared for and wants to return there for her continued care and rehab.  Patient states that her daughter Joanna [name] helps her with medical issues and decisions and it is ok to keep in contact with her daughter.  Fl2 completed and placed on chart for MD's signature.    Employment status:  Retired Health and safety inspector:  Medicare PT Recommendations:  Not assessed at this time Information / Referral to community resources:  Skilled Nursing Facility  Patient/Family's Response to care: Patient indicates  that she is agreeable to the current treatment/plan of care and that she is feeling much better. Wants to return to SNF when stable.  Patient/Family's Understanding of and Emotional Response to Diagnosis, Current Treatment, and Prognosis:  Family not present at time of the assessment-however patient is alert, oriented and very aware of her current diagnosis and treatment plan. She states that she has great confidence in her doctors and feels that they are helping her to get better.    Emotional Assessment Appearance:  Appears stated age Attitude/Demeanor/Rapport: Calm, relaxed, appropriate, cooperative   Affect (typically observed):   Happy, calm Orientation:  Oriented to Self, Oriented to Place, Oriented to  Time, Oriented to Situation Alcohol / Substance use:  Never Used Psych involvement (Current and /or in the community):  No (Comment)  Discharge Needs  Concerns to be addressed:  Care Coordination Readmission within the last 30 days:  Yes Current discharge risk:  None Barriers to Discharge:  No Barriers Identified   Darylene Price, LCSW 04/03/2015, 9:05 PM

## 2015-04-04 DIAGNOSIS — D638 Anemia in other chronic diseases classified elsewhere: Secondary | ICD-10-CM

## 2015-04-04 DIAGNOSIS — J9601 Acute respiratory failure with hypoxia: Secondary | ICD-10-CM

## 2015-04-04 DIAGNOSIS — J189 Pneumonia, unspecified organism: Secondary | ICD-10-CM

## 2015-04-04 DIAGNOSIS — A419 Sepsis, unspecified organism: Principal | ICD-10-CM

## 2015-04-04 LAB — BASIC METABOLIC PANEL
ANION GAP: 11 (ref 5–15)
BUN: 50 mg/dL — ABNORMAL HIGH (ref 6–20)
CALCIUM: 9.5 mg/dL (ref 8.9–10.3)
CO2: 23 mmol/L (ref 22–32)
Chloride: 102 mmol/L (ref 101–111)
Creatinine, Ser: 2.13 mg/dL — ABNORMAL HIGH (ref 0.44–1.00)
GFR calc non Af Amer: 20 mL/min — ABNORMAL LOW (ref >60)
GFR, EST AFRICAN AMERICAN: 24 mL/min — AB (ref >60)
Glucose, Bld: 174 mg/dL — ABNORMAL HIGH (ref 65–99)
Potassium: 4.8 mmol/L (ref 3.5–5.1)
SODIUM: 136 mmol/L (ref 135–145)

## 2015-04-04 LAB — GLUCOSE, CAPILLARY
GLUCOSE-CAPILLARY: 230 mg/dL — AB (ref 65–99)
Glucose-Capillary: 141 mg/dL — ABNORMAL HIGH (ref 65–99)
Glucose-Capillary: 178 mg/dL — ABNORMAL HIGH (ref 65–99)
Glucose-Capillary: 169 mg/dL — ABNORMAL HIGH (ref 65–99)
Glucose-Capillary: 236 mg/dL — ABNORMAL HIGH (ref 65–99)

## 2015-04-04 LAB — PROCALCITONIN: PROCALCITONIN: 1.39 ng/mL

## 2015-04-04 MED ORDER — FUROSEMIDE 10 MG/ML IJ SOLN
40.0000 mg | Freq: Once | INTRAMUSCULAR | Status: AC
  Administered 2015-04-04: 40 mg via INTRAVENOUS

## 2015-04-04 MED ORDER — AMIODARONE LOAD VIA INFUSION
150.0000 mg | Freq: Once | INTRAVENOUS | Status: AC
  Administered 2015-04-04: 150 mg via INTRAVENOUS
  Filled 2015-04-04: qty 83.34

## 2015-04-04 MED ORDER — AMIODARONE HCL IN DEXTROSE 360-4.14 MG/200ML-% IV SOLN
60.0000 mg/h | INTRAVENOUS | Status: AC
  Filled 2015-04-04 (×2): qty 200

## 2015-04-04 MED ORDER — AMIODARONE HCL IN DEXTROSE 360-4.14 MG/200ML-% IV SOLN
30.0000 mg/h | INTRAVENOUS | Status: DC
  Administered 2015-04-05 – 2015-04-09 (×8): 30 mg/h via INTRAVENOUS
  Filled 2015-04-04 (×18): qty 200

## 2015-04-04 NOTE — Progress Notes (Signed)
ANTIBIOTIC CONSULT NOTE   Pharmacy Consult for Vanco/Zosyn Indication: Sepsis/PNA  Allergies  Allergen Reactions  . Other Hives    STEROIDS  . Prednisone Hives and Other (See Comments)    She is allergic to all steroids!   Assessment: 81 year old female continues on Vancomycin and Zosyn for sepsis/pneumoia.   Afebrile, WBC stable Cultures negative Scr has improved  Goal of Therapy:  Vancomycin trough level 15-20 mcg/ml  Plan:  Increase vancomycin to 1 gram iv Q 48 hours  Continue Zosyn 2.25 grams iv Q 8 hours  Follow up for de-escalation of antibiotics.  - Day # 5 of broad spectrum antibiotics      Labs:  Recent Labs  04/02/15 0004 04/02/15 0520 04/03/15 0420 04/04/15 0239  WBC 13.4*  --  12.5*  --   HGB 10.2*  --  10.3*  --   PLT 193  --  189  --   CREATININE  --  2.10* 2.14* 2.13*   Estimated Creatinine Clearance: 17.6 mL/min (by C-G formula based on Cr of 2.13).  Recent Labs  04/02/15 1420  VANCORANDOM 6     Microbiology: Recent Results (from the past 720 hour(s))  Culture, blood (routine x 2)     Status: None   Collection Time: 03/16/15  4:18 PM  Result Value Ref Range Status   Specimen Description BLOOD LEFT WRIST  Final   Special Requests BOTTLES DRAWN AEROBIC ONLY 3CC  Final   Culture   Final    NO GROWTH 5 DAYS Performed at [facility]    Report Status 03/23/2015 FINAL  Final  Culture, blood (routine x 2)     Status: None   Collection Time: 03/16/15  4:21 PM  Result Value Ref Range Status   Specimen Description BLOOD RIGHT ARM  Final   Special Requests BOTTLES DRAWN AEROBIC AND ANAEROBIC 3CC  Final   Culture   Final    NO GROWTH 5 DAYS Performed at [facility]    Report Status 03/23/2015 FINAL  Final  Urine culture     Status: None   Collection Time: 03/16/15  5:44 PM  Result Value Ref Range Status   Specimen Description URINE, CLEAN CATCH  Final   Special Requests NONE  Final   Colony Count   Final    >=100,000  COLONIES/ML Performed at [facility]    Culture   Final    ESCHERICHIA COLI Performed at [facility]    Report Status 03/19/2015 FINAL  Final   Organism ID, Bacteria ESCHERICHIA COLI  Final      Susceptibility   Escherichia coli - MIC*    AMPICILLIN >=32 RESISTANT Resistant     CEFAZOLIN <=4 SENSITIVE Sensitive     CEFTRIAXONE <=1 SENSITIVE Sensitive     CIPROFLOXACIN <=0.25 SENSITIVE Sensitive     GENTAMICIN <=1 SENSITIVE Sensitive     LEVOFLOXACIN <=0.12 SENSITIVE Sensitive     NITROFURANTOIN <=16 SENSITIVE Sensitive     TOBRAMYCIN <=1 SENSITIVE Sensitive     TRIMETH/SULFA <=20 SENSITIVE Sensitive     PIP/TAZO <=4 SENSITIVE Sensitive     * ESCHERICHIA COLI  MRSA PCR Screening     Status: None   Collection Time: 03/16/15  7:57 PM  Result Value Ref Range Status   MRSA by PCR NEGATIVE NEGATIVE Final    Comment:        The GeneXpert MRSA Assay (FDA approved for NASAL specimens only), is one component of a comprehensive  MRSA colonization surveillance program. It is not intended to diagnose MRSA infection nor to guide or monitor treatment for MRSA infections.   Culture, blood (routine x 2)     Status: None (Preliminary result)   Collection Time: 03/31/15  6:40 AM  Result Value Ref Range Status   Specimen Description BLOOD PICC LINE RIGHT ARM  Final   Special Requests BOTTLES DRAWN AEROBIC AND ANAEROBIC 5CC  Final   Culture   Final           BLOOD CULTURE RECEIVED NO GROWTH TO DATE CULTURE WILL BE HELD FOR 5 DAYS BEFORE ISSUING A FINAL NEGATIVE REPORT Performed at [facility]    Report Status PENDING  Incomplete  Culture, blood (routine x 2)     Status: None (Preliminary result)   Collection Time: 03/31/15  7:36 AM  Result Value Ref Range Status   Specimen Description BLOOD LEFT HAND  Final   Special Requests BOTTLES DRAWN AEROBIC AND ANAEROBIC 5CC  Final   Culture   Final           BLOOD CULTURE RECEIVED NO GROWTH TO DATE CULTURE WILL BE  HELD FOR 5 DAYS BEFORE ISSUING A FINAL NEGATIVE REPORT Performed at [facility]    Report Status PENDING  Incomplete  Urine culture     Status: None   Collection Time: 03/31/15 11:52 AM  Result Value Ref Range Status   Specimen Description URINE, RANDOM  Final   Special Requests NONE  Final   Colony Count NO GROWTH Performed at [facility]   Final   Culture NO GROWTH Performed at [facility]   Final   Report Status 04/01/2015 FINAL  Final     Thank you. Okey Regal, PharmD [phone] 04/04/2015,10:00 AM

## 2015-04-04 NOTE — Consult Note (Signed)
Amiodarone Drug - Drug Interaction Consult Note  Recommendations:  Monitor electrolytes and possible need for K+ or Mg+ repletion while on K+ and Mg++ depleting diuretics.  Note that patient was on Lasix and Zaroxolyn Prior to Admission.  Amiodarone is metabolized by the cytochrome P450 system and therefore has the potential to cause many drug interactions. Amiodarone has an average plasma half-life of 50 days (range 20 to 100 days).   There is potential for drug interactions to occur several weeks or months after stopping treatment and the onset of drug interactions may be slow after initiating amiodarone.   []  Statins: Increased risk of myopathy. Simvastatin- restrict dose to 20mg  daily. Other statins: counsel patients to report any muscle pain or weakness immediately.  []  Anticoagulants: Amiodarone can increase anticoagulant effect. Consider warfarin dose reduction. Patients should be monitored closely and the dose of anticoagulant altered accordingly, remembering that amiodarone levels take several weeks to stabilize.  []  Antiepileptics: Amiodarone can increase plasma concentration of phenytoin, the dose should be reduced. Note that small changes in phenytoin dose can result in large changes in levels. Monitor patient and counsel on signs of toxicity.  []  Beta blockers: increased risk of bradycardia, AV block and myocardial depression. Sotalol - avoid concomitant use.  []   Calcium channel blockers (diltiazem and verapamil): increased risk of bradycardia, AV block and myocardial depression.  []   Cyclosporine: Amiodarone increases levels of cyclosporine. Reduced dose of cyclosporine is recommended.  []  Digoxin dose should be halved when amiodarone is started.  [x]  Diuretics: increased risk of cardiotoxicity/ventricular arrhythmias from Hypokalemia, Hypomagnesemia.    []  Oral hypoglycemic agents (glyburide, glipizide, glimepiride): increased risk of hypoglycemia. Patient's glucose levels  should be monitored closely when initiating amiodarone therapy.   []  Drugs that prolong the QT interval:  Torsades de pointes risk may be increased with concurrent use - avoid if possible.  Monitor QTc, also keep magnesium/potassium WNL if concurrent therapy can't be avoided. Marland Kitchen Antibiotics: e.g. fluoroquinolones, erythromycin. . Antiarrhythmics: e.g. quinidine, procainamide, disopyramide, sotalol. . Antipsychotics: e.g. phenothiazines, haloperidol.  . Lithium, tricyclic antidepressants, and methadone. Thank You,  Velda Shell, Pharm.D.  04/04/2015 6:32 PM

## 2015-04-04 NOTE — Progress Notes (Signed)
CSW spoke with patient's daughter [name] this afternoon. She stated that she will be unable to hold patient's bed at [facility] after today due to the high cost of the bed hold.  She is hopeful that patient will be medically stable tomorrow to return to the SNF. CSW discussed with Janie- Admissions at [facility]. Lorri Frederick. Jaci Lazier, [place] [phone]

## 2015-04-04 NOTE — Progress Notes (Signed)
  Weight up so we gave 1 dose IV lasix. Pacer adjusted to promote LV capture.   Hopefully should be ready for d/c in am.  Jamilynn Whitacre,MD 4:32 PM

## 2015-04-04 NOTE — Progress Notes (Signed)
[facility]                                                                              Patient Demographics  Joanna [name], is a 81 y.o. female, DOB - 12/21/1929, WUJ:[id number]  Admit date - 03/31/2015   Admitting Physician Alison Murray, MD  Outpatient Primary MD for the patient is Sanda Linger, MD  LOS - 4   Chief Complaint  Patient presents with  . Hypoglycemia       Brief HPI   81yo F with chronic systolic congestive heart failure on chronic milrinone tx (TTE April 2016 EF 20-25%), atrial fibrillation, AVR/MVR 06/15/2013, hypertension, DM2, dyslipidemia, hospitalization for decompensated CHF from 03/16/2015 through 03/30/2015 during which time she had pacemaker placement who presented to [facility] ER from her SNF with CBGs in 20s. She had reports of lightheadedness, nausea but no vomiting. When she was given glucagon and dextrose her symptoms improved. In ED blood pressure was 83/49, heart rate 55, respiratory rate 12-21, afebrile and oxygen saturation 94% with nasal cannula oxygen support. Blood work was notable for white blood cell count of 24.6, hemoglobin 11.4, potassium 3.2 and creatinine 2.52. Troponin level was 0.06. 12 lead EKG showed underlying atrial fibrillation and left bundle branch block unchanged from previous tracings. CXR showed stable cardiomegaly, increased vascular congestion, possible pulmonary edema and increasing left middling consolidation possible edema or subcutaneous hematoma from recent pacemaker placement   Assessment & Plan    Sepsis due to L mid lung/basilar pneumonia/HCAP - Currently improving, chest x-ray showed persisting atelectasis +/- infiltrate plus probable effusion left base  - Clinically improving, pro-calcitonin improving, leukocytosis improving, no fevers - Blood cultures negative so far, urine cultures negative -Continue empiric antibiotics  Hypotension  Presented with hypotension, currently resolved, likely due  to #1   Acute respiratory failure with hypoxia - Currently stable, O2 sats 98% on room air   Severe chronic nonischemic systolic CHF on chronic milrinone s/p [employer] CRT-D 03/21/15 Cardiology following, may need pacemaker interrogation  Mod > severe Tricuspid regurgitation, biprostheatic aortic and mitral valve replacement  Chronic LBBB / biventricular pacemaker - Cardiology following, may need pacemaker interrogation  Hypoglycemia in DM2 Reported to have CBG in the 20s, treated with IV dextrose. Possibly due to insulin, Januvia and sepsis at the time of admission with poor oral intake. - will discontinue Januvia outpatient, currently no hypoglycemia.   Troponin elevation Troponin currently trending down, cardiology following, patient denies any symptoms, chest pain  CKD stage 3 Baseline crt 2.4 - creatinine stable   Chronic PAF Rate controlled CHADS vasc score 6 - continue eliquis - presently well controlled  Code Status: DO NOT RESUSCITATE  Family Communication: Discussed in detail with the patient, all imaging results, lab results explained to the patient    Disposition Plan: Once cleared by cardiology, hopefully next 24-48 hours  Time Spent in minutes   25 minutes  Procedures  None  Consults   Cardiology   DVT Prophylaxis  apixaban  Medications  Scheduled Meds: . apixaban  2.5 mg Oral BID  . docusate sodium  100 mg Oral BID  . furosemide  40 mg Oral BID  . insulin aspart  0-9 Units Subcutaneous TID WC  . piperacillin-tazobactam (ZOSYN)  IV  2.25 g Intravenous Q8H  . potassium chloride SA  40 mEq Oral BID  . [START ON 04/05/2015] vancomycin  1,000 mg Intravenous Q48H   Continuous Infusions: . sodium chloride 10 mL/hr at 04/02/15 1609  . milrinone 0.25 mcg/kg/min (04/04/15 0150)   PRN Meds:.sodium chloride   Antibiotics   Anti-infectives    Start     Dose/Rate Route Frequency Ordered Stop   04/05/15 0630  vancomycin (VANCOCIN) IVPB 1000 mg/200 mL  premix     1,000 mg 200 mL/hr over 60 Minutes Intravenous Every 48 hours 04/03/15 1030     04/03/15 0600  vancomycin (VANCOCIN) IVPB 1000 mg/200 mL premix  Status:  Discontinued     1,000 mg 200 mL/hr over 60 Minutes Intravenous every 72 hours 03/31/15 0749 04/03/15 1030   03/31/15 1800  piperacillin-tazobactam (ZOSYN) IVPB 2.25 g     2.25 g 100 mL/hr over 30 Minutes Intravenous Every 8 hours 03/31/15 1020     03/31/15 1000  piperacillin-tazobactam (ZOSYN) IVPB 3.375 g     3.375 g 100 mL/hr over 30 Minutes Intravenous  Once 03/31/15 0953 03/31/15 1043   03/31/15 0830  imipenem-cilastatin (PRIMAXIN) 250 mg in sodium chloride 0.9 % 100 mL IVPB  Status:  Discontinued     250 mg 200 mL/hr over 30 Minutes Intravenous Every 12 hours 03/31/15 0749 03/31/15 0953   03/31/15 0800  vancomycin (VANCOCIN) IVPB 1000 mg/200 mL premix     1,000 mg 200 mL/hr over 60 Minutes Intravenous  Once 03/31/15 0749 03/31/15 1113        Subjective:   Joanna [name] was seen and examined today. Patient denies dizziness, chest pain, shortness of breath, abdominal pain, N/V/D/C, new weakness, numbess, tingling. No acute events overnight.  Just feeling weak today.  Objective:   Blood pressure 124/83, pulse 96, temperature 98.2 F (36.8 C), temperature source Oral, resp. rate 18, height 5\' 4"  (1.626 m), weight 63.73 kg (140 lb 8 oz), SpO2 98 %.  Wt Readings from Last 3 Encounters:  04/04/15 63.73 kg (140 lb 8 oz)  03/30/15 60.2 kg (132 lb 11.5 oz)  03/13/15 66.225 kg (146 lb)     Intake/Output Summary (Last 24 hours) at 04/04/15 1044 Last data filed at 04/04/15 0826  Gross per 24 hour  Intake    770 ml  Output   1380 ml  Net   -610 ml    Exam  General: Alert and oriented x 3, NAD  HEENT:  PERRLA, EOMI, Anicteric Sclera, mucous membranes moist.   Neck: Supple,  CVS: S1 S2 auscultated, 2/6 SEM  Respiratory: Decreased breath sounds at the bases  Abdomen: Soft, nontender, nondistended, + bowel  sounds  Ext: no cyanosis clubbing or edema  Neuro: AAOx3, Cr N's II- XII. Strength 5/5 upper and lower extremities bilaterally  Skin: No rashes  Psych: Normal affect and demeanor, alert and oriented x3    Data Review   Micro Results Recent Results (from the past 240 hour(s))  Culture, blood (routine x 2)     Status: None (Preliminary result)   Collection Time: 03/31/15  6:40 AM  Result Value Ref Range Status   Specimen Description BLOOD PICC LINE RIGHT ARM  Final   Special Requests BOTTLES DRAWN AEROBIC AND ANAEROBIC 5CC  Final   Culture   Final           BLOOD CULTURE  RECEIVED NO GROWTH TO DATE CULTURE WILL BE HELD FOR 5 DAYS BEFORE ISSUING A FINAL NEGATIVE REPORT Performed at [facility]    Report Status PENDING  Incomplete  Culture, blood (routine x 2)     Status: None (Preliminary result)   Collection Time: 03/31/15  7:36 AM  Result Value Ref Range Status   Specimen Description BLOOD LEFT HAND  Final   Special Requests BOTTLES DRAWN AEROBIC AND ANAEROBIC 5CC  Final   Culture   Final           BLOOD CULTURE RECEIVED NO GROWTH TO DATE CULTURE WILL BE HELD FOR 5 DAYS BEFORE ISSUING A FINAL NEGATIVE REPORT Performed at [facility]    Report Status PENDING  Incomplete  Urine culture     Status: None   Collection Time: 03/31/15 11:52 AM  Result Value Ref Range Status   Specimen Description URINE, RANDOM  Final   Special Requests NONE  Final   Colony Count NO GROWTH Performed at [facility]   Final   Culture NO GROWTH Performed at [facility]   Final   Report Status 04/01/2015 FINAL  Final    Radiology Reports Dg Chest 2 View  03/16/2015   CLINICAL DATA:  Acute onset of chest pain and shortness of breath. Cough and congestion. Initial encounter.  EXAM: CHEST  2 VIEW  COMPARISON:  Chest radiograph performed 12/14/2014  FINDINGS: The lungs are well-aerated. A small left pleural effusion is noted. Chronically increased interstitial  markings are noted. Mild peribronchial thickening is noted. No pneumothorax is identified. Scarring is seen at the left lung apex.  The heart is enlarged. The patient is status post median sternotomy. A valve replacement is noted. Orphaned pacemaker leads are visualized. No acute osseous abnormalities are seen.  IMPRESSION: Small left pleural effusion noted. Chronically increased interstitial markings seen. Cardiomegaly.   Electronically Signed   By: Roanna Raider M.D.   On: 03/16/2015 17:26   Dg Ankle Complete Right  03/23/2015   CLINICAL DATA:  Post right ankle surgery 6 weeks ago.  EXAM: RIGHT ANKLE - COMPLETE 3+ VIEW  COMPARISON:  01/30/2015  FINDINGS: Plate and screw fixation device noted across the distal fibular shaft fracture. Medial malleolar screws in place across the medial malleolar fractures. Anatomic alignment. No acute bony abnormality. Fracture lines are difficult to visualize. Fine bony detail is obscured by overlying cast material.  IMPRESSION: Evidence of healing across the distal fibular shaft fracture and medial malleolar fractures. Fine bony detail obscured by overlying cast.   Electronically Signed   By: Charlett Nose M.D.   On: 03/23/2015 15:41   Ct Head Wo Contrast  03/17/2015   CLINICAL DATA:  Acute encephalopathy.  No headache.  EXAM: CT HEAD WITHOUT CONTRAST  TECHNIQUE: Contiguous axial images were obtained from the base of the skull through the vertex without intravenous contrast.  COMPARISON:  12/15/2014  FINDINGS: The ventricles are normal configuration. There is ventricular and sulcal enlargement reflecting mild to moderate age related atrophy. No hydrocephalus.  There are no parenchymal masses or mass effect. There is no evidence of a cortical infarct. Mild periventricular white matter hypoattenuation is noted consistent with chronic microvascular ischemic change.  There are no extra-axial masses or abnormal fluid collections.  No intracranial hemorrhage.  Sinuses and mastoid  air cells are clear.  IMPRESSION: 1. No acute intracranial abnormalities. 2. Mild to moderate atrophy. Mild chronic microvascular ischemic change.   Electronically Signed   By: Onalee Hua  Ormond M.D.   On: 03/17/2015 16:35   US Abdomen Complete  03/17/2015   CLINICAL DATA:  Nausea and vomiting  EXAM: ULTRASOUND ABDOMEN COMPLETE  COMPARISON:  None.  FINDINGS: Gallbladder: Gallbladder wall thickening measuring 6 mm and pericholecystic fluid noted. Adherent sludge balls or polyps, largest 3 mm. No sonographic Murphy sign.  Common bile duct: Diameter: 7 mm  Liver: No focal lesion identified. Within normal limits in parenchymal echogenicity.  IVC: No abnormality visualized.  Pancreas: Visualized portion unremarkable.  Spleen: Size and appearance within normal limits.  Right Kidney: Length: 11.2 cm, increased renal cortical echogenicity. No hydronephrosis. Cortical cysts are noted, largest 1.5 x 1.4 x 1.4 cm.  Left Kidney: Length: 10.7 cm. Suboptimally visualized due to lack of optimal acoustic window with increased cortical echogenicity but no focal abnormality or mass. No hydronephrosis.  Abdominal aorta: No aneurysm visualized.  Other findings: Ascites and bilateral pleural effusions are noted. Incidental note is made of bladder distention, volume 1094 cc.  IMPRESSION: Increased renal cortical echogenicity suggesting medical renal disease, with right renal cysts.  Bladder distention.  Gallbladder wall thickening, pericholecystic fluid, and adherent sludge balls or polyps. If the patient has right upper quadrant abdominal pain, findings would be highly suspicious for acute cholecystitis. However, there is ascites and gallbladder wall thickening may also be secondary to liver disease or hypoproteinemia.  Pleural effusions and ascites.   Electronically Signed   By: Christiana Pellant M.D.   On: 03/17/2015 09:30   Dg Chest Port 1 View  04/03/2015   CLINICAL DATA:  Line placement.  PICC placement.  EXAM: PORTABLE CHEST - 1  VIEW  COMPARISON:  04/03/2015.  0917 hours  FINDINGS: RIGHT upper extremity PICC is present. The tip appears at the junction of the superior vena cava and RIGHT atrium.  Unchanged cardiomegaly. LEFT pleural effusion. Valvular replacement and median sternotomy. Aortic arch atherosclerosis. Mildly improved aeration at the RIGHT lung base.  IMPRESSION: RIGHT upper extremity PICC tip is at the junction of the superior vena cava and RIGHT atrium.   Electronically Signed   By: Andreas Newport M.D.   On: 04/03/2015 19:23   Dg Chest Port 1 View  04/03/2015   CLINICAL DATA:  81 year old female status post PICC line repositioning. Initial encounter.  EXAM: PORTABLE CHEST - 1 VIEW  COMPARISON:  0605 hours today and earlier.  FINDINGS: Portable AP semi upright view at 0917 hours.  On 04/02/2015 the right side PICC line tip was evident at the cavoatrial junction region. The tip now appears to terminate at the level of the third from the top median sternotomy wire, about 2 cm above the carina.  Superimposed left chest 3 lead cardiac pacemaker, epicardial leads, cardiac prosthetic bowels, cardiomegaly, calcified atherosclerosis of the aorta. Stable ventilation. No pneumothorax.  IMPRESSION: 1. Right PICC line appears pulled back, tip now at the upper SVC level. 2. Otherwise stable chest.   Electronically Signed   By: Odessa Fleming M.D.   On: 04/03/2015 09:29   Dg Chest Port 1 View  04/03/2015   CLINICAL DATA:  Pneumonia.  EXAM: PORTABLE CHEST - 1 VIEW  COMPARISON:  04/02/2015, 03/31/2015, 03/21/2015 and 12/14/2014  FINDINGS: Chronic cardiomegaly. Aortic and mitral valve prostheses. Pacemaker. PICC unchanged.  Pulmonary vascularity is now normal. Large left pleural effusion appears slightly improved with no residual infiltrate visible on the left. Probable small right effusion.  IMPRESSION: Slightly diminished left pleural effusion. Small right effusion, unchanged. No discrete infiltrates.   Electronically Signed  By: Francene Boyers M.D.   On: 04/03/2015 07:54   Dg Chest Port 1 View  04/02/2015   CLINICAL DATA:  Pneumonia.  EXAM: PORTABLE CHEST - 1 VIEW  COMPARISON:  03/31/2015.  FINDINGS: Right PICC line in stable position. Cardiac pacer in stable position. Aortic valve replacement. Cardiomegaly with mild pulmonary venous congestion. Mild interstitial prominence is present. Left lower lobe infiltrate and/or atelectasis present. Left pleural effusion cannot be excluded. No pneumothorax. P  IMPRESSION: 1. Right PICC line in stable position. 2. Cardiac pacer in stable position. Aortic valve replacement. Cardiomegaly with persistent mild interstitial prominence suggesting mild congestive heart failure. 3. Prominent left lower lobe atelectasis and/or infiltrate. Left pleural effusion.   Electronically Signed   By: Maisie Fus  Register   On: 04/02/2015 07:24   Dg Chest Port 1 View  03/31/2015   CLINICAL DATA:  Hypotension. History of diabetes, hypertension, cardiac valve replacement.  EXAM: PORTABLE CHEST - 1 VIEW  COMPARISON:  Chest radiograph March 21, 2015  FINDINGS: The cardiac silhouette is moderately enlarged, unchanged. Calcified aortic knob. Status post mitral and aortic valve replacement and Common median sternotomy. Pulmonary vascular congestion increasing interstitial prominence. Patchy LEFT midlung zone consolidation with moderate LEFT pleural effusion. No pneumothorax.  RIGHT PICC distal tip projects in mid superior vena cava. Three lead LEFT cardiac pacemaker. The soft tissue planes included osseous structure nonsuspicious.  IMPRESSION: Stable cardiomegaly. Increasing pulmonary vascular congestion. Interstitial prominence concerning for pulmonary edema with increasing LEFT midlung zone consolidations/confluent edema versus subcutaneous hematoma from recent pacemaker placement. Moderate LEFT pleural effusion.  No apparent change in position of RIGHT PICC line.   Electronically Signed   By: Awilda Metro M.D.   On: 03/31/2015  04:42   Dg Chest Port 1 View  03/21/2015   CLINICAL DATA:  Pacemaker insertion today. Postprocedural radiograph.  EXAM: PORTABLE CHEST - 1 VIEW  COMPARISON:  03/20/2015.  FINDINGS: Support apparatus: Monitoring leads project over the chest. New pacemaker power pack with 3 LEFT subclavian leads. Leads appear in appropriate position with coronary sinus lead. Aortic valve replacements are present. Old epicardial pacing leads are present.  The cardiopericardial silhouette remains enlarged. There is bilateral interstitial and mild alveolar airspace disease compatible with pulmonary edema/volume overload. Aortic arch atherosclerosis.  IMPRESSION:  1. Interval placement of 3 lead LEFT subclavian pacemaker and power pack. 2. Negative for pneumothorax or complicating features. 3. Mild interstitial and alveolar pulmonary edema.   Electronically Signed   By: Andreas Newport M.D.   On: 03/21/2015 17:02   Dg Chest Port 1 View  03/20/2015   CLINICAL DATA:  PICC line placement  EXAM: PORTABLE CHEST - 1 VIEW  COMPARISON:  03/17/2015  FINDINGS: Right PICC line is in place. The tip is in the SVC. There is cardiomegaly. Devitalized left pacer wires remain in place, unchanged. Prior valve replacement. Left lower lobe opacity with left effusion again noted, unchanged.  IMPRESSION: Right PICC line tip in the SVC.  No change in the left lower lobe opacity and left effusion.   Electronically Signed   By: Charlett Nose M.D.   On: 03/20/2015 11:14   Dg Chest Port 1 View  03/17/2015   CLINICAL DATA:  Central line placement.  EXAM: PORTABLE CHEST - 1 VIEW  COMPARISON:  03/16/2015  FINDINGS: Patient is moderately rotated to the left. Sternotomy wires and prosthetic heart valve unchanged. Evidence of a right IJ central venous catheter with tip likely over the SVC accounting for patient's rotation. No evidence  of pneumothorax. There is continued left base opacification unchanged to slightly worse likely effusions/ atelectasis and less  likely infection. Mild prominence of the perihilar markings suggesting mild vascular congestion. Remainder of the exam is unchanged.  IMPRESSION: Slight worsening left base opacification likely effusion with atelectasis although cannot exclude infection.  Suggestion mild vascular congestion.  Right IJ central venous catheter with tip likely over the region of the SVC. No pneumothorax.   Electronically Signed   By: Elberta Fortis M.D.   On: 03/17/2015 12:51    CBC  Recent Labs Lab 03/30/15 0500 03/31/15 0430 04/02/15 0004 04/03/15 0420  WBC 9.4 24.6* 13.4* 12.5*  HGB 10.1* 11.4* 10.2* 10.3*  HCT 31.7* 35.2* 32.1* 32.4*  PLT 226 267 193 189  MCV 93.8 94.9 93.9 94.2  MCH 29.9 30.7 29.8 29.9  MCHC 31.9 32.4 31.8 31.8  RDW 15.8* 15.9* 16.1* 16.0*  LYMPHSABS  --  1.2  --   --   MONOABS  --  1.4*  --   --   EOSABS  --  0.1  --   --   BASOSABS  --  0.0  --   --     Chemistries   Recent Labs Lab 03/31/15 0430 04/01/15 0400 04/02/15 0520 04/02/15 1003 04/03/15 0420 04/04/15 0239  NA 136 136 137  --  137 136  K 3.2* 3.4* 3.2*  --  4.1 4.8  CL 94* 95* 98*  --  100* 102  CO2 --  28 23  GLUCOSE 148* 110* 164*  --  151* 174*  BUN 55* 57* 52*  --  51* 50*  CREATININE 2.52* 2.21* 2.10*  --  2.14* 2.13*  CALCIUM 9.2 8.6* 8.9  --  9.3 9.5  MG  --   --   --  2.0  --   --   AST 44*  --   --   --   --   --   ALT 27  --   --   --   --   --   ALKPHOS 72  --   --   --   --   --   BILITOT 1.0  --   --   --   --   --    ------------------------------------------------------------------------------------------------------------------ estimated creatinine clearance is 17.6 mL/min (by C-G formula based on Cr of 2.13). ------------------------------------------------------------------------------------------------------------------ No results for input(s): HGBA1C in the last 72  hours. ------------------------------------------------------------------------------------------------------------------ No results for input(s): CHOL, HDL, LDLCALC, TRIG, CHOLHDL, LDLDIRECT in the last 72 hours. ------------------------------------------------------------------------------------------------------------------ No results for input(s): TSH, T4TOTAL, T3FREE, THYROIDAB in the last 72 hours.  Invalid input(s): FREET3 ------------------------------------------------------------------------------------------------------------------ No results for input(s): VITAMINB12, FOLATE, FERRITIN, TIBC, IRON, RETICCTPCT in the last 72 hours.  Coagulation profile No results for input(s): INR, PROTIME in the last 168 hours.  No results for input(s): DDIMER in the last 72 hours.  Cardiac Enzymes  Recent Labs Lab 04/02/15 0520 04/02/15 1330 04/03/15 0420  TROPONINI 0.15* 0.13* 0.11*   ------------------------------------------------------------------------------------------------------------------ Invalid input(s): POCBNP   Recent Labs  04/02/15 2110 04/03/15 0610 04/03/15 1059 04/03/15 1623 04/03/15 2059 04/04/15 0618  GLUCAP 210* 150* 141* 238* 174* 141*     RAI,RIPUDEEP M.D. [facility] 04/04/2015, 10:44 AM  Pager: [phone]   Between 7am to 7pm - call Pager - [phone]  After 7pm go to [email or web address] - password [email or web address]  Call night coverage person covering after 7pm

## 2015-04-05 ENCOUNTER — Other Ambulatory Visit (HOSPITAL_COMMUNITY): Payer: Self-pay

## 2015-04-05 DIAGNOSIS — I5020 Unspecified systolic (congestive) heart failure: Secondary | ICD-10-CM

## 2015-04-05 DIAGNOSIS — I5043 Acute on chronic combined systolic (congestive) and diastolic (congestive) heart failure: Secondary | ICD-10-CM

## 2015-04-05 LAB — GLUCOSE, CAPILLARY
GLUCOSE-CAPILLARY: 218 mg/dL — AB (ref 65–99)
GLUCOSE-CAPILLARY: 179 mg/dL — AB (ref 65–99)
GLUCOSE-CAPILLARY: 187 mg/dL — AB (ref 65–99)
GLUCOSE-CAPILLARY: 167 mg/dL — AB (ref 65–99)

## 2015-04-05 MED ORDER — MILRINONE IN DEXTROSE 20 MG/100ML IV SOLN: 0.2500 ug/kg/min | INTRAVENOUS | Status: DC

## 2015-04-05 MED ORDER — METOLAZONE 5 MG PO TABS: 5.0000 mg | ORAL_TABLET | Freq: Once | ORAL | Status: DC

## 2015-04-05 MED ORDER — FUROSEMIDE 10 MG/ML IJ SOLN
80.0000 mg | Freq: Two times a day (BID) | INTRAMUSCULAR | Status: AC
  Administered 2015-04-05 (×2): 80 mg via INTRAVENOUS
  Filled 2015-04-05: qty 8

## 2015-04-05 MED ORDER — METOLAZONE 2.5 MG PO TABS
2.5000 mg | ORAL_TABLET | Freq: Two times a day (BID) | ORAL | Status: AC
  Administered 2015-04-05 (×2): 2.5 mg via ORAL
  Filled 2015-04-05 (×2): qty 1

## 2015-04-05 NOTE — Progress Notes (Signed)
Inpatient Diabetes Program Recommendations  AACE/ADA: New Consensus Statement on Inpatient Glycemic Control (2013)  Target Ranges:  Prepandial:   less than 140 mg/dL      Peak postprandial:   less than 180 mg/dL (1-2 hours)      Critically ill patients:  140 - 180 mg/dL   Inpatient Diabetes Program Recommendations Insulin - Basal: consider adding Lantus 10 units daily Correction (SSI): . Insulin - Meal Coverage: . Thank you  Piedad Climes BSN, RN,CDE Inpatient Diabetes Coordinator [phone] (team pager)

## 2015-04-05 NOTE — Progress Notes (Signed)
Patient was scheduled for return to [facility] today but d/c delayed when it was determined that patient has had additional weight gain.  Possible d/c tomorrow or Saturday per Drs. Rai and Bensimhon depending on HF status.  CSW discussed with patient and her daughter [name]. Both were disappointed about d/c cancellation but were understanding of reason for d/c delay.  Daughter can no longer hold bed at SNF. CSW discussed with Janie- Admissions at [facility]. She is aware of bed release and stated that CSW will need to notify her once d/c is confirmed and she will determine if a bed is available.  Lorri Frederick. Jaci Lazier, [place] [phone]

## 2015-04-05 NOTE — Progress Notes (Signed)
Advanced Heart Failure Rounding Note   Subjective:     Had AF last night and treated with IV amio. Now back in NSR. Feels ok. Weight up    Objective:   Weight Range:  Vital Signs:   Temp:  [97.4 F (36.3 C)-98.2 F (36.8 C)] 97.4 F (36.3 C) (06/16 1402) Pulse Rate:  [80-92] 81 (06/16 1402) Resp:  [18-20] 18 (06/16 1402) BP: (104-118)/(63-76) 112/63 mmHg (06/16 1402) SpO2:  [93 %-97 %] 94 % (06/16 1402) Weight:  [64.638 kg (142 lb 8 oz)] 64.638 kg (142 lb 8 oz) (06/16 0726) Last BM Date: 04/04/15  Weight change: Filed Weights   04/03/15 0652 04/04/15 0515 04/05/15 0726  Weight: 62.869 kg (138 lb 9.6 oz) 63.73 kg (140 lb 8 oz) 64.638 kg (142 lb 8 oz)    Intake/Output:   Intake/Output Summary (Last 24 hours) at 04/05/15 1857 Last data filed at 04/05/15 1838  Gross per 24 hour  Intake   2100 ml  Output   1725 ml  Net    375 ml     Physical Exam: General: Elderly woman. Sitting in chair. NAD Neck: JVP jaw, no thyromegaly or thyroid nodule.  Lungs: clear but decreased at bases CV: Nondisplaced PMI. Heart regular S1/S2, 2/6 SEM RUSB. 1+ edema.  Abdomen: Soft, nontender, no hepatosplenomegaly, + mild distention.  Neurologic: Alert and oriented x 3.  Psych: tearful at times Extremities: No clubbing or cyanosis.   TELEMETRY: Reviewed telemetry pt in NSR with v-pacing and occasional PVCs.  Labs: Basic Metabolic Panel:  Recent Labs Lab 03/31/15 0430 04/01/15 0400 04/02/15 0520 04/02/15 1003 04/03/15 0420 04/04/15 0239  NA 136 136 137  --  137 136  K 3.2* 3.4* 3.2*  --  4.1 4.8  CL 94* 95* 98*  --  100* 102  CO2 --  28 23  GLUCOSE 148* 110* 164*  --  151* 174*  BUN 55* 57* 52*  --  51* 50*  CREATININE 2.52* 2.21* 2.10*  --  2.14* 2.13*  CALCIUM 9.2 8.6* 8.9  --  9.3 9.5  MG  --   --   --  2.0  --   --     Liver Function Tests:  Recent Labs Lab 03/31/15 0430  AST 44*  ALT 27  ALKPHOS 72  BILITOT 1.0  PROT 6.3*  ALBUMIN 3.2*    No results for input(s): LIPASE, AMYLASE in the last 168 hours. No results for input(s): AMMONIA in the last 168 hours.  CBC:  Recent Labs Lab 03/30/15 0500 03/31/15 0430 04/02/15 0004 04/03/15 0420  WBC 9.4 24.6* 13.4* 12.5*  NEUTROABS  --  21.9*  --   --   HGB 10.1* 11.4* 10.2* 10.3*  HCT 31.7* 35.2* 32.1* 32.4*  MCV 93.8 94.9 93.9 94.2  PLT 226 267 193 189    Cardiac Enzymes:  Recent Labs Lab 03/31/15 0430 04/02/15 0520 04/02/15 1330 04/03/15 0420  TROPONINI 0.06* 0.15* 0.13* 0.11*    BNP: BNP (last 3 results)  Recent Labs  03/13/15 1028 03/16/15 1619 03/31/15 0430  BNP 4332.8* >4500.0* 3300.6*    ProBNP (last 3 results) No results for input(s): PROBNP in the last 8760 hours.    Other results:  Imaging: Dg Chest Port 1 View  04/03/2015   CLINICAL DATA:  Line placement.  PICC placement.  EXAM: PORTABLE CHEST - 1 VIEW  COMPARISON:  04/03/2015.  0917 hours  FINDINGS: RIGHT upper extremity PICC is present. The tip  appears at the junction of the superior vena cava and RIGHT atrium.  Unchanged cardiomegaly. LEFT pleural effusion. Valvular replacement and median sternotomy. Aortic arch atherosclerosis. Mildly improved aeration at the RIGHT lung base.  IMPRESSION: RIGHT upper extremity PICC tip is at the junction of the superior vena cava and RIGHT atrium.   Electronically Signed   By: Andreas Newport M.D.   On: 04/03/2015 19:23      Medications:     Scheduled Medications: . apixaban  2.5 mg Oral BID  . docusate sodium  100 mg Oral BID  . insulin aspart  0-9 Units Subcutaneous TID WC  . metolazone  2.5 mg Oral BID  . piperacillin-tazobactam (ZOSYN)  IV  2.25 g Intravenous Q8H  . potassium chloride SA  40 mEq Oral BID  . vancomycin  1,000 mg Intravenous Q48H     Infusions: . sodium chloride 10 mL/hr at 04/02/15 1609  . amiodarone 30 mg/hr (04/05/15 1240)  . milrinone 0.25 mcg/kg/min (04/04/15 1753)     PRN Medications:  sodium  chloride   Assessment:   1. Hypoglycemic event 2. Possible aspiration PNA 3. Acute on chronic systolic HF requiring home milrinone. EF 25%. S/p CRT-D upgrade 5/16 4. PAF 5. Bioprosthetic MV/AoV 6. Depression  7. CKD, stage IV 8. DNR/DNI  Plan/Discussion:     She is back in NSR. Unfortunately now with signficant volume overload. Will need to keep today. Start IV lasix and metolazone. To [name] when improved.    Length of Stay: 5   Arvilla Meres MD 04/05/2015, 6:57 PM  Advanced Heart Failure Team Pager [phone] (M-F; 7a - 4p)  Please contact [facility] for night-coverage after hours (4p -7a ) and weekends on [email or web address]

## 2015-04-05 NOTE — Progress Notes (Signed)
Paged by nursing staff for rapid HR around 5pm, telemetry reviewed, appears to have wide complex irregular tachycardia, consist with BBB+rapid afib. Saw the patient in room, who was sitting in bed comfortable, without any cardiac awareness of rapid afib.   Patient previously on amiodarone prior to admission. On eliquis. Currently on primacor, discussed with Dr. Shirlee Latch, will initiate IV amio for now.   Ramond Dial PA Pager: [phone]

## 2015-04-05 NOTE — Progress Notes (Signed)
UR COMPLETED  

## 2015-04-05 NOTE — Progress Notes (Signed)
Patient is for possible discharge to SNF today with Milrinone gtt managed by [facility]; Pam with [facility] made aware. Hulan Saas Worker following case. Abelino Derrick RN,BSN,MHA [phone]

## 2015-04-05 NOTE — Progress Notes (Signed)
Physical Therapy Treatment Patient Details Name: Joanna [name] MRN: [id number] DOB: 06-26-1930 Today's Date: 04/05/2015    History of Present Illness Joanna [name] is a 81 y.o. female has a past medical history significant for chronic systolic and diastolic heart failure, ejection fraction 20-25%, prior AVR/MVR 8/14, type 2 diabetes mellitus, hypertension, hyperlipidemia, fall with right ankle fx s/p ORIF 4/13, last admission cardiogenic shock with pacemaker replacement 6/1. Cast removed 6/3 with pt now WBAT in CAM boot. Pt readmitted from [facility] with sepsis, HCAP    PT Comments    Per Dr. Milas Kocher, pt with increased weight and retaining fluids.  Pt. With less energy today and needed several standing rest breaks for walking 120'.  Less activity tolerance today.  Pt. 's HR remained 80s to 90s with walking.  No dyspnea, no dizziness.  Follow Up Recommendations  SNF     Equipment Recommendations  None recommended by PT    Recommendations for Other Services       Precautions / Restrictions Precautions Precautions: Fall Required Braces or Orthoses: Other Brace/Splint Other Brace/Splint: CAM boot RLE with all OOB activity Restrictions Weight Bearing Restrictions: Yes RLE Weight Bearing: Weight bearing as tolerated    Mobility  Bed Mobility Overal bed mobility: Needs Assistance Bed Mobility: Supine to Sit     Supine to sit: Supervision     General bed mobility comments: supervision for safety; no physical assist needed  Transfers Overall transfer level: Needs assistance Equipment used: Rolling walker (2 wheeled) Transfers: Sit to/from Stand Sit to Stand: Min guard         General transfer comment: Verbal cues for hand placement; min guard for safety  Ambulation/Gait Ambulation/Gait assistance: Min guard Ambulation Distance (Feet): 120 Feet Assistive device: Rolling walker (2 wheeled) Gait Pattern/deviations: Step-through pattern;Decreased stride length;Trunk  flexed Gait velocity: Decreased   General Gait Details: Pt. needed to take several standing rest breaks, appears tired and with decreased energy level compared to last PT session   Stairs            Wheelchair Mobility    Modified Rankin (Stroke Patients Only)       Balance                                    Cognition Arousal/Alertness: Awake/alert Behavior During Therapy: WFL for tasks assessed/performed Overall Cognitive Status: Within Functional Limits for tasks assessed       Memory: Decreased short-term memory              Exercises      General Comments        Pertinent Vitals/Pain Pain Assessment: No/denies pain  HR 80s and 90s with ambulation    Home Living                      Prior Function            PT Goals (current goals can now be found in the care plan section) Progress towards PT goals: Progressing toward goals    Frequency  Min 3X/week    PT Plan Current plan remains appropriate    Co-evaluation             End of Session Equipment Utilized During Treatment: Gait belt;Other (comment) (cam boot) Activity Tolerance: Patient limited by fatigue Patient left: in chair;with call bell/phone within reach;with chair alarm set  Time: 8469-6295 PT Time Calculation (min) (ACUTE ONLY): 23 min  Charges:  $Therapeutic Exercise: 23-37 mins                    G Codes:      Ferman Hamming 04/05/2015, 10:03 AM Weldon Picking PT Acute Rehab Services [phone] [place] [phone]

## 2015-04-05 NOTE — Progress Notes (Signed)
[facility]                                                                              Patient Demographics  Joanna [name], is a 81 y.o. female, DOB - [birth year], ZOX:[id number]  Admit date - 03/31/2015   Admitting Physician Alison Murray, MD  Outpatient Primary MD for the patient is Sanda Linger, MD  LOS - 5   Chief Complaint  Patient presents with  . Hypoglycemia       Brief HPI   81yo F with chronic systolic congestive heart failure on chronic milrinone tx (TTE April 2016 EF 20-25%), atrial fibrillation, AVR/MVR 06/15/2013, hypertension, DM2, dyslipidemia, hospitalization for decompensated CHF from 03/16/2015 through 03/30/2015 during which time she had pacemaker placement who presented to [facility] ER from her SNF with CBGs in 20s. She had reports of lightheadedness, nausea but no vomiting. When she was given glucagon and dextrose her symptoms improved. In ED blood pressure was 83/49, heart rate 55, respiratory rate 12-21, afebrile and oxygen saturation 94% with nasal cannula oxygen support. Blood work was notable for white blood cell count of 24.6, hemoglobin 11.4, potassium 3.2 and creatinine 2.52. Troponin level was 0.06. 12 lead EKG showed underlying atrial fibrillation and left bundle branch block unchanged from previous tracings. CXR showed stable cardiomegaly, increased vascular congestion, possible pulmonary edema and increasing left middling consolidation possible edema or subcutaneous hematoma from recent pacemaker placement   Assessment & Plan    Sepsis due to L mid lung/basilar pneumonia/HCAP - Currently improving, chest x-ray showed persisting atelectasis +/- infiltrate plus probable effusion left base  - Clinically improving, pro-calcitonin improving, leukocytosis improving, no fevers - Blood cultures negative so far, urine cultures negative - Continue empiric antibiotics   Severe chronic nonischemic systolic CHF on chronic milrinone s/p St  Jude CRT-D 03/21/15 Cardiology following, may need pacemaker interrogation Continue milrinone drip Weight still up, still significantly volume overloaded, discussed with heart failure team, will give Lasix 80 mg IV 2 today and also started on Zaroxolyn  Hypotension  Presented with hypotension, currently resolved, likely due to #1   Acute respiratory failure with hypoxia - Currently stable, O2 sats 98% on room air   Mod > severe Tricuspid regurgitation, biprostheatic aortic and mitral valve replacement  Chronic LBBB / biventricular pacemaker - Cardiology following, pacemaker interrogation done  Hypoglycemia in DM2 Reported to have CBG in the 20s, treated with IV dextrose. Possibly due to insulin, Januvia and sepsis at the time of admission with poor oral intake. - will discontinue Januvia outpatient, currently no hypoglycemia.  -We'll add Lantus 10 units Daily  Troponin elevation Troponin currently trending down, cardiology following, patient denies any symptoms, chest pain  CKD stage 3 Baseline crt 2.4 - creatinine stable   Chronic PAF Rate controlled CHADS vasc score 6 - continue eliquis - presently well controlled  Code Status: DO NOT RESUSCITATE  Family Communication: Discussed in detail with the patient, all imaging results, lab results explained to the patient    Disposition Plan: Once cleared by cardiology, hopefully next 24-48 hours  Time Spent in minutes   25 minutes  Procedures  None  Consults   Cardiology   DVT Prophylaxis  apixaban  Medications  Scheduled Meds: . apixaban  2.5 mg Oral BID  . docusate sodium  100 mg Oral BID  . furosemide  80 mg Intravenous BID  . insulin aspart  0-9 Units Subcutaneous TID WC  . metolazone  2.5 mg Oral BID  . piperacillin-tazobactam (ZOSYN)  IV  2.25 g Intravenous Q8H  . potassium chloride SA  40 mEq Oral BID  . vancomycin  1,000 mg Intravenous Q48H   Continuous Infusions: . sodium chloride 10 mL/hr at 04/02/15  1609  . amiodarone 30 mg/hr (04/05/15 0049)  . milrinone 0.25 mcg/kg/min (04/04/15 1753)   PRN Meds:.sodium chloride   Antibiotics   Anti-infectives    Start     Dose/Rate Route Frequency Ordered Stop   04/05/15 0630  vancomycin (VANCOCIN) IVPB 1000 mg/200 mL premix     1,000 mg 200 mL/hr over 60 Minutes Intravenous Every 48 hours 04/03/15 1030     04/03/15 0600  vancomycin (VANCOCIN) IVPB 1000 mg/200 mL premix  Status:  Discontinued     1,000 mg 200 mL/hr over 60 Minutes Intravenous every 72 hours 03/31/15 0749 04/03/15 1030   03/31/15 1800  piperacillin-tazobactam (ZOSYN) IVPB 2.25 g     2.25 g 100 mL/hr over 30 Minutes Intravenous Every 8 hours 03/31/15 1020     03/31/15 1000  piperacillin-tazobactam (ZOSYN) IVPB 3.375 g     3.375 g 100 mL/hr over 30 Minutes Intravenous  Once 03/31/15 0953 03/31/15 1043   03/31/15 0830  imipenem-cilastatin (PRIMAXIN) 250 mg in sodium chloride 0.9 % 100 mL IVPB  Status:  Discontinued     250 mg 200 mL/hr over 30 Minutes Intravenous Every 12 hours 03/31/15 0749 03/31/15 0953   03/31/15 0800  vancomycin (VANCOCIN) IVPB 1000 mg/200 mL premix     1,000 mg 200 mL/hr over 60 Minutes Intravenous  Once 03/31/15 0749 03/31/15 1113        Subjective:   Joanna [name] was seen and examined today.  denies any chest pain, nausea or vomiting. Still have significant volume overload. Patient denies dizziness, chest pain, shortness of breath, abdominal pain, new weakness, numbess, tingling. No acute events overnight.  Just feeling weak today.  Objective:   Blood pressure 118/65, pulse 80, temperature 97.7 F (36.5 C), temperature source Oral, resp. rate 20, height  (1.626 m), weight 64.638 kg (142 lb 8 oz), SpO2 93 %.  Wt Readings from Last 3 Encounters:  04/05/15 64.638 kg (142 lb 8 oz)  03/30/15 60.2 kg (132 lb 11.5 oz)  03/13/15 66.225 kg (146 lb)     Intake/Output Summary (Last 24 hours) at 04/05/15 1155 Last data filed at 04/05/15 0953   Gross per 24 hour  Intake 1839.15 ml  Output    325 ml  Net 1514.15 ml    Exam  General: Alert and oriented x 3, NAD  HEENT:  PERRLA, EOMI, Anicteric Sclera, mucous membranes moist.   Neck: Supple,  CVS: S1 S2 auscultated, 2/6 SEM  Respiratory: Decreased breath sounds at the bases  Abdomen: Soft, nontender, distended, + bowel sounds  Ext: no cyanosis clubbing or edema  Neuro: AAOx3, Cr N's II- XII. Strength 5/5 upper and lower extremities bilaterally  Skin: No rashes  Psych: Normal affect and demeanor, alert and oriented x3    Data Review   Micro Results Recent Results (from the past 240 hour(s))  Culture, blood (routine x 2)     Status: None (Preliminary result)  Collection Time: 03/31/15  6:40 AM  Result Value Ref Range Status   Specimen Description BLOOD PICC LINE RIGHT ARM  Final   Special Requests BOTTLES DRAWN AEROBIC AND ANAEROBIC 5CC  Final   Culture   Final           BLOOD CULTURE RECEIVED NO GROWTH TO DATE CULTURE WILL BE HELD FOR 5 DAYS BEFORE ISSUING A FINAL NEGATIVE REPORT Performed at [facility]    Report Status PENDING  Incomplete  Culture, blood (routine x 2)     Status: None (Preliminary result)   Collection Time: 03/31/15  7:36 AM  Result Value Ref Range Status   Specimen Description BLOOD LEFT HAND  Final   Special Requests BOTTLES DRAWN AEROBIC AND ANAEROBIC 5CC  Final   Culture   Final           BLOOD CULTURE RECEIVED NO GROWTH TO DATE CULTURE WILL BE HELD FOR 5 DAYS BEFORE ISSUING A FINAL NEGATIVE REPORT Performed at [facility]    Report Status PENDING  Incomplete  Urine culture     Status: None   Collection Time: 03/31/15 11:52 AM  Result Value Ref Range Status   Specimen Description URINE, RANDOM  Final   Special Requests NONE  Final   Colony Count NO GROWTH Performed at [facility]   Final   Culture NO GROWTH Performed at [facility]   Final   Report Status 04/01/2015 FINAL  Final     Radiology Reports Dg Chest 2 View  03/16/2015   CLINICAL DATA:  Acute onset of chest pain and shortness of breath. Cough and congestion. Initial encounter.  EXAM: CHEST  2 VIEW  COMPARISON:  Chest radiograph performed 12/14/2014  FINDINGS: The lungs are well-aerated. A small left pleural effusion is noted. Chronically increased interstitial markings are noted. Mild peribronchial thickening is noted. No pneumothorax is identified. Scarring is seen at the left lung apex.  The heart is enlarged. The patient is status post median sternotomy. A valve replacement is noted. Orphaned pacemaker leads are visualized. No acute osseous abnormalities are seen.  IMPRESSION: Small left pleural effusion noted. Chronically increased interstitial markings seen. Cardiomegaly.   Electronically Signed   By: Roanna Raider M.D.   On: 03/16/2015 17:26   Dg Ankle Complete Right  03/23/2015   CLINICAL DATA:  Post right ankle surgery 6 weeks ago.  EXAM: RIGHT ANKLE - COMPLETE 3+ VIEW  COMPARISON:  01/30/2015  FINDINGS: Plate and screw fixation device noted across the distal fibular shaft fracture. Medial malleolar screws in place across the medial malleolar fractures. Anatomic alignment. No acute bony abnormality. Fracture lines are difficult to visualize. Fine bony detail is obscured by overlying cast material.  IMPRESSION: Evidence of healing across the distal fibular shaft fracture and medial malleolar fractures. Fine bony detail obscured by overlying cast.   Electronically Signed   By: Charlett Nose M.D.   On: 03/23/2015 15:41   Ct Head Wo Contrast  03/17/2015   CLINICAL DATA:  Acute encephalopathy.  No headache.  EXAM: CT HEAD WITHOUT CONTRAST  TECHNIQUE: Contiguous axial images were obtained from the base of the skull through the vertex without intravenous contrast.  COMPARISON:  12/15/2014  FINDINGS: The ventricles are normal configuration. There is ventricular and sulcal enlargement reflecting mild to moderate age related  atrophy. No hydrocephalus.  There are no parenchymal masses or mass effect. There is no evidence of a cortical infarct. Mild periventricular white matter hypoattenuation is noted  consistent with chronic microvascular ischemic change.  There are no extra-axial masses or abnormal fluid collections.  No intracranial hemorrhage.  Sinuses and mastoid air cells are clear.  IMPRESSION: 1. No acute intracranial abnormalities. 2. Mild to moderate atrophy. Mild chronic microvascular ischemic change.   Electronically Signed   By: Amie Portland M.D.   On: 03/17/2015 16:35   US Abdomen Complete  03/17/2015   CLINICAL DATA:  Nausea and vomiting  EXAM: ULTRASOUND ABDOMEN COMPLETE  COMPARISON:  None.  FINDINGS: Gallbladder: Gallbladder wall thickening measuring 6 mm and pericholecystic fluid noted. Adherent sludge balls or polyps, largest 3 mm. No sonographic Murphy sign.  Common bile duct: Diameter: 7 mm  Liver: No focal lesion identified. Within normal limits in parenchymal echogenicity.  IVC: No abnormality visualized.  Pancreas: Visualized portion unremarkable.  Spleen: Size and appearance within normal limits.  Right Kidney: Length: 11.2 cm, increased renal cortical echogenicity. No hydronephrosis. Cortical cysts are noted, largest 1.5 x 1.4 x 1.4 cm.  Left Kidney: Length: 10.7 cm. Suboptimally visualized due to lack of optimal acoustic window with increased cortical echogenicity but no focal abnormality or mass. No hydronephrosis.  Abdominal aorta: No aneurysm visualized.  Other findings: Ascites and bilateral pleural effusions are noted. Incidental note is made of bladder distention, volume 1094 cc.  IMPRESSION: Increased renal cortical echogenicity suggesting medical renal disease, with right renal cysts.  Bladder distention.  Gallbladder wall thickening, pericholecystic fluid, and adherent sludge balls or polyps. If the patient has right upper quadrant abdominal pain, findings would be highly suspicious for acute  cholecystitis. However, there is ascites and gallbladder wall thickening may also be secondary to liver disease or hypoproteinemia.  Pleural effusions and ascites.   Electronically Signed   By: Christiana Pellant M.D.   On: 03/17/2015 09:30   Dg Chest Port 1 View  04/03/2015   CLINICAL DATA:  Line placement.  PICC placement.  EXAM: PORTABLE CHEST - 1 VIEW  COMPARISON:  04/03/2015.  0917 hours  FINDINGS: RIGHT upper extremity PICC is present. The tip appears at the junction of the superior vena cava and RIGHT atrium.  Unchanged cardiomegaly. LEFT pleural effusion. Valvular replacement and median sternotomy. Aortic arch atherosclerosis. Mildly improved aeration at the RIGHT lung base.  IMPRESSION: RIGHT upper extremity PICC tip is at the junction of the superior vena cava and RIGHT atrium.   Electronically Signed   By: Andreas Newport M.D.   On: 04/03/2015 19:23   Dg Chest Port 1 View  04/03/2015   CLINICAL DATA:  81 year old female status post PICC line repositioning. Initial encounter.  EXAM: PORTABLE CHEST - 1 VIEW  COMPARISON:  0605 hours today and earlier.  FINDINGS: Portable AP semi upright view at 0917 hours.  On 04/02/2015 the right side PICC line tip was evident at the cavoatrial junction region. The tip now appears to terminate at the level of the third from the top median sternotomy wire, about 2 cm above the carina.  Superimposed left chest 3 lead cardiac pacemaker, epicardial leads, cardiac prosthetic bowels, cardiomegaly, calcified atherosclerosis of the aorta. Stable ventilation. No pneumothorax.  IMPRESSION: 1. Right PICC line appears pulled back, tip now at the upper SVC level. 2. Otherwise stable chest.   Electronically Signed   By: Odessa Fleming M.D.   On: 04/03/2015 09:29   Dg Chest Port 1 View  04/03/2015   CLINICAL DATA:  Pneumonia.  EXAM: PORTABLE CHEST - 1 VIEW  COMPARISON:  04/02/2015, 03/31/2015, 03/21/2015 and 12/14/2014  FINDINGS: Chronic  cardiomegaly. Aortic and mitral valve prostheses.  Pacemaker. PICC unchanged.  Pulmonary vascularity is now normal. Large left pleural effusion appears slightly improved with no residual infiltrate visible on the left. Probable small right effusion.  IMPRESSION: Slightly diminished left pleural effusion. Small right effusion, unchanged. No discrete infiltrates.   Electronically Signed   By: Francene Boyers M.D.   On: 04/03/2015 07:54   Dg Chest Port 1 View  04/02/2015   CLINICAL DATA:  Pneumonia.  EXAM: PORTABLE CHEST - 1 VIEW  COMPARISON:  03/31/2015.  FINDINGS: Right PICC line in stable position. Cardiac pacer in stable position. Aortic valve replacement. Cardiomegaly with mild pulmonary venous congestion. Mild interstitial prominence is present. Left lower lobe infiltrate and/or atelectasis present. Left pleural effusion cannot be excluded. No pneumothorax. P  IMPRESSION: 1. Right PICC line in stable position. 2. Cardiac pacer in stable position. Aortic valve replacement. Cardiomegaly with persistent mild interstitial prominence suggesting mild congestive heart failure. 3. Prominent left lower lobe atelectasis and/or infiltrate. Left pleural effusion.   Electronically Signed   By: Maisie Fus  Register   On: 04/02/2015 07:24   Dg Chest Port 1 View  03/31/2015   CLINICAL DATA:  Hypotension. History of diabetes, hypertension, cardiac valve replacement.  EXAM: PORTABLE CHEST - 1 VIEW  COMPARISON:  Chest radiograph March 21, 2015  FINDINGS: The cardiac silhouette is moderately enlarged, unchanged. Calcified aortic knob. Status post mitral and aortic valve replacement and Common median sternotomy. Pulmonary vascular congestion increasing interstitial prominence. Patchy LEFT midlung zone consolidation with moderate LEFT pleural effusion. No pneumothorax.  RIGHT PICC distal tip projects in mid superior vena cava. Three lead LEFT cardiac pacemaker. The soft tissue planes included osseous structure nonsuspicious.  IMPRESSION: Stable cardiomegaly. Increasing pulmonary  vascular congestion. Interstitial prominence concerning for pulmonary edema with increasing LEFT midlung zone consolidations/confluent edema versus subcutaneous hematoma from recent pacemaker placement. Moderate LEFT pleural effusion.  No apparent change in position of RIGHT PICC line.   Electronically Signed   By: Awilda Metro M.D.   On: 03/31/2015 04:42   Dg Chest Port 1 View  03/21/2015   CLINICAL DATA:  Pacemaker insertion today. Postprocedural radiograph.  EXAM: PORTABLE CHEST - 1 VIEW  COMPARISON:  03/20/2015.  FINDINGS: Support apparatus: Monitoring leads project over the chest. New pacemaker power pack with 3 LEFT subclavian leads. Leads appear in appropriate position with coronary sinus lead. Aortic valve replacements are present. Old epicardial pacing leads are present.  The cardiopericardial silhouette remains enlarged. There is bilateral interstitial and mild alveolar airspace disease compatible with pulmonary edema/volume overload. Aortic arch atherosclerosis.  IMPRESSION:  1. Interval placement of 3 lead LEFT subclavian pacemaker and power pack. 2. Negative for pneumothorax or complicating features. 3. Mild interstitial and alveolar pulmonary edema.   Electronically Signed   By: Andreas Newport M.D.   On: 03/21/2015 17:02   Dg Chest Port 1 View  03/20/2015   CLINICAL DATA:  PICC line placement  EXAM: PORTABLE CHEST - 1 VIEW  COMPARISON:  03/17/2015  FINDINGS: Right PICC line is in place. The tip is in the SVC. There is cardiomegaly. Devitalized left pacer wires remain in place, unchanged. Prior valve replacement. Left lower lobe opacity with left effusion again noted, unchanged.  IMPRESSION: Right PICC line tip in the SVC.  No change in the left lower lobe opacity and left effusion.   Electronically Signed   By: Charlett Nose M.D.   On: 03/20/2015 11:14   Dg Chest Port 1 View  03/17/2015  CLINICAL DATA:  Central line placement.  EXAM: PORTABLE CHEST - 1 VIEW  COMPARISON:  03/16/2015   FINDINGS: Patient is moderately rotated to the left. Sternotomy wires and prosthetic heart valve unchanged. Evidence of a right IJ central venous catheter with tip likely over the SVC accounting for patient's rotation. No evidence of pneumothorax. There is continued left base opacification unchanged to slightly worse likely effusions/ atelectasis and less likely infection. Mild prominence of the perihilar markings suggesting mild vascular congestion. Remainder of the exam is unchanged.  IMPRESSION: Slight worsening left base opacification likely effusion with atelectasis although cannot exclude infection.  Suggestion mild vascular congestion.  Right IJ central venous catheter with tip likely over the region of the SVC. No pneumothorax.   Electronically Signed   By: Elberta Fortis M.D.   On: 03/17/2015 12:51    CBC  Recent Labs Lab 03/30/15 0500 03/31/15 0430 04/02/15 0004 04/03/15 0420  WBC 9.4 24.6* 13.4* 12.5*  HGB 10.1* 11.4* 10.2* 10.3*  HCT 31.7* 35.2* 32.1* 32.4*  PLT 226 267 193 189  MCV 93.8 94.9 93.9 94.2  MCH 29.9 30.7 29.8 29.9  MCHC 31.9 32.4 31.8 31.8  RDW 15.8* 15.9* 16.1* 16.0*  LYMPHSABS  --  1.2  --   --   MONOABS  --  1.4*  --   --   EOSABS  --  0.1  --   --   BASOSABS  --  0.0  --   --     Chemistries   Recent Labs Lab 03/31/15 0430 04/01/15 0400 04/02/15 0520 04/02/15 1003 04/03/15 0420 04/04/15 0239  NA 136 136 137  --  137 136  K 3.2* 3.4* 3.2*  --  4.1 4.8  CL 94* 95* 98*  --  100* 102  CO2 29 30 30   --  28 23  GLUCOSE 148* 110* 164*  --  151* 174*  BUN 55* 57* 52*  --  51* 50*  CREATININE 2.52* 2.21* 2.10*  --  2.14* 2.13*  CALCIUM 9.2 8.6* 8.9  --  9.3 9.5  MG  --   --   --  2.0  --   --   AST 44*  --   --   --   --   --   ALT 27  --   --   --   --   --   ALKPHOS 72  --   --   --   --   --   BILITOT 1.0  --   --   --   --   --     ------------------------------------------------------------------------------------------------------------------ estimated creatinine clearance is 17.6 mL/min (by C-G formula based on Cr of 2.13). ------------------------------------------------------------------------------------------------------------------ No results for input(s): HGBA1C in the last 72 hours. ------------------------------------------------------------------------------------------------------------------ No results for input(s): CHOL, HDL, LDLCALC, TRIG, CHOLHDL, LDLDIRECT in the last 72 hours. ------------------------------------------------------------------------------------------------------------------ No results for input(s): TSH, T4TOTAL, T3FREE, THYROIDAB in the last 72 hours.  Invalid input(s): FREET3 ------------------------------------------------------------------------------------------------------------------ No results for input(s): VITAMINB12, FOLATE, FERRITIN, TIBC, IRON, RETICCTPCT in the last 72 hours.  Coagulation profile No results for input(s): INR, PROTIME in the last 168 hours.  No results for input(s): DDIMER in the last 72 hours.  Cardiac Enzymes  Recent Labs Lab 04/02/15 0520 04/02/15 1330 04/03/15 0420  TROPONINI 0.15* 0.13* 0.11*   ------------------------------------------------------------------------------------------------------------------ Invalid input(s): POCBNP   Recent Labs  04/04/15 1124 04/04/15 1623 04/04/15 1819 04/04/15 2151 04/05/15 0800 04/05/15 1039  GLUCAP 178* 169* 236* 230* 218* 179*  RAI,RIPUDEEP M.D. [facility] 04/05/2015, 11:55 AM  Pager: [phone]   Between 7am to 7pm - call Pager - [phone]  After 7pm go to [email or web address] - password [email or web address]  Call night coverage person covering after 7pm

## 2015-04-06 ENCOUNTER — Inpatient Hospital Stay (HOSPITAL_COMMUNITY): Admit: 2015-04-06 | Payer: Medicare Other

## 2015-04-06 LAB — GLUCOSE, CAPILLARY
GLUCOSE-CAPILLARY: 184 mg/dL — AB (ref 65–99)
Glucose-Capillary: 161 mg/dL — ABNORMAL HIGH (ref 65–99)
Glucose-Capillary: 166 mg/dL — ABNORMAL HIGH (ref 65–99)
Glucose-Capillary: 159 mg/dL — ABNORMAL HIGH (ref 65–99)

## 2015-04-06 LAB — CULTURE, BLOOD (ROUTINE X 2)
Culture: NO GROWTH
Culture: NO GROWTH

## 2015-04-06 LAB — BASIC METABOLIC PANEL
ANION GAP: 10 (ref 5–15)
BUN: 49 mg/dL — ABNORMAL HIGH (ref 6–20)
CALCIUM: 9.2 mg/dL (ref 8.9–10.3)
CO2: 26 mmol/L (ref 22–32)
Chloride: 101 mmol/L (ref 101–111)
Creatinine, Ser: 1.9 mg/dL — ABNORMAL HIGH (ref 0.44–1.00)
GFR calc non Af Amer: 23 mL/min — ABNORMAL LOW (ref >60)
GFR, EST AFRICAN AMERICAN: 27 mL/min — AB (ref >60)
Glucose, Bld: 198 mg/dL — ABNORMAL HIGH (ref 65–99)
Potassium: 3.5 mmol/L (ref 3.5–5.1)
Sodium: 137 mmol/L (ref 135–145)

## 2015-04-06 LAB — PROCALCITONIN

## 2015-04-06 MED ORDER — LEVOFLOXACIN 500 MG PO TABS
500.0000 mg | ORAL_TABLET | ORAL | Status: DC
  Administered 2015-04-08: 500 mg via ORAL
  Filled 2015-04-06 (×3): qty 1

## 2015-04-06 MED ORDER — INSULIN GLARGINE 100 UNIT/ML ~~LOC~~ SOLN
10.0000 [IU] | Freq: Every day | SUBCUTANEOUS | Status: DC
  Administered 2015-04-06 – 2015-04-07 (×2): 10 [IU] via SUBCUTANEOUS
  Filled 2015-04-06 (×3): qty 0.1

## 2015-04-06 MED ORDER — METOLAZONE 5 MG PO TABS
5.0000 mg | ORAL_TABLET | Freq: Once | ORAL | Status: AC
  Administered 2015-04-06: 5 mg via ORAL
  Filled 2015-04-06: qty 1

## 2015-04-06 MED ORDER — FUROSEMIDE 10 MG/ML IJ SOLN: 80.0000 mg | Freq: Once | INTRAMUSCULAR | Status: DC

## 2015-04-06 MED ORDER — FUROSEMIDE 10 MG/ML IJ SOLN
80.0000 mg | Freq: Two times a day (BID) | INTRAMUSCULAR | Status: DC
  Administered 2015-04-06 – 2015-04-08 (×5): 80 mg via INTRAVENOUS
  Filled 2015-04-06 (×7): qty 8

## 2015-04-06 MED ORDER — LEVOFLOXACIN 750 MG PO TABS
750.0000 mg | ORAL_TABLET | Freq: Once | ORAL | Status: AC
  Administered 2015-04-06: 750 mg via ORAL
  Filled 2015-04-06: qty 1

## 2015-04-06 NOTE — Progress Notes (Signed)
Physical Therapy Treatment Patient Details Name: Joanna [name] MRN: [id number] DOB: 10-01-1930 Today's Date: 04/06/2015    History of Present Illness Joanna [name] is a 81 y.o. female has a past medical history significant for chronic systolic and diastolic heart failure, ejection fraction 20-25%, prior AVR/MVR 8/14, type 2 diabetes mellitus, hypertension, hyperlipidemia, fall with right ankle fx s/p ORIF 4/13, last admission cardiogenic shock with pacemaker replacement 6/1. Cast removed 6/3 with pt now WBAT in CAM boot. Pt readmitted from [facility] with sepsis, HCAP    PT Comments    Patient progressing with ambulation this session. Better endurance. Due to fluid gain, patient will not be [facility] today per MD note. Encouraged ambulation with staff. Continue with current POC  Follow Up Recommendations  SNF     Equipment Recommendations  None recommended by PT    Recommendations for Other Services       Precautions / Restrictions Precautions Precautions: Fall Required Braces or Orthoses: Other Brace/Splint Other Brace/Splint: CAM boot RLE with all OOB activity Restrictions RLE Weight Bearing: Weight bearing as tolerated    Mobility  Bed Mobility               General bed mobility comments: UP in recliner  Transfers Overall transfer level: Needs assistance Equipment used: Rolling walker (2 wheeled)   Sit to Stand: Min guard         General transfer comment: Verbal cues for hand placement; min guard for safety  Ambulation/Gait Ambulation/Gait assistance: Min guard Ambulation Distance (Feet): 100 Feet (x2) Assistive device: Rolling walker (2 wheeled) Gait Pattern/deviations: Step-through pattern;Decreased stride length   Gait velocity interpretation: Below normal speed for age/gender General Gait Details: Only one seated rest break. Increased endurance and tolerance today to ambulation.    Stairs            Wheelchair Mobility    Modified Rankin  (Stroke Patients Only)       Balance                                    Cognition Arousal/Alertness: Awake/alert Behavior During Therapy: WFL for tasks assessed/performed Overall Cognitive Status: Within Functional Limits for tasks assessed                      Exercises      General Comments        Pertinent Vitals/Pain Pain Assessment: No/denies pain    Home Living                      Prior Function            PT Goals (current goals can now be found in the care plan section) Progress towards PT goals: Progressing toward goals    Frequency  Min 3X/week    PT Plan Current plan remains appropriate    Co-evaluation             End of Session Equipment Utilized During Treatment: Gait belt Activity Tolerance: Patient tolerated treatment well Patient left: in chair     Time: 5093-2671 PT Time Calculation (min) (ACUTE ONLY): 16 min  Charges:  $Gait Training: 8-22 mins                    G Codes:      Fredrich Birks 04/06/2015, 2:49 PM 04/06/2015 Fredrich Birks PTA [phone]  pager [phone] office

## 2015-04-06 NOTE — Progress Notes (Signed)
Advanced Heart Failure Rounding Note   Subjective:     Says she feels great and doesn't understand where fluid is. Weight up.  Peeing well per pt.   Objective:   Weight Range:  Vital Signs:   Temp:  [97.4 F (36.3 C)-98.4 F (36.9 C)] 97.7 F (36.5 C) (06/17 0450) Pulse Rate:  [71-81] 78 (06/17 0450) Resp:  [16-19] 16 (06/17 0450) BP: (103-112)/(63-69) 103/63 mmHg (06/17 0450) SpO2:  [94 %-96 %] 96 % (06/17 0450) Weight:  [144 lb 12.8 oz (65.681 kg)] 144 lb 12.8 oz (65.681 kg) (06/17 0450) Last BM Date: 04/05/15  Weight change: Filed Weights   04/04/15 0515 04/05/15 0726 04/06/15 0450  Weight: 140 lb 8 oz (63.73 kg) 142 lb 8 oz (64.638 kg) 144 lb 12.8 oz (65.681 kg)    Intake/Output:   Intake/Output Summary (Last 24 hours) at 04/06/15 0951 Last data filed at 04/06/15 0932  Gross per 24 hour  Intake 2027.24 ml  Output   3300 ml  Net -1272.76 ml     Physical Exam: General: Elderly woman. Lying in bed  NAD Neck: JVP jaw, no thyromegaly or thyroid nodule.  Lungs: clear but decreased at bases CV: Nondisplaced PMI. Heart regular S1/S2, 2/6 SEM RUSB. trace edema.  Abdomen: Soft, nontender, no hepatosplenomegaly, + mild distention.  Neurologic: Alert and oriented x 3.  Psych: tearful at times Extremities: No clubbing or cyanosis.   TELEMETRY: Reviewed telemetry pt in NSR with v-pacing and occasional PVCs.  Labs: Basic Metabolic Panel:  Recent Labs Lab 03/31/15 0430 04/01/15 0400 04/02/15 0520 04/02/15 1003 04/03/15 0420 04/04/15 0239  NA 136 136 137  --  137 136  K 3.2* 3.4* 3.2*  --  4.1 4.8  CL 94* 95* 98*  --  100* 102  CO2 29 30 30   --  28 23  GLUCOSE 148* 110* 164*  --  151* 174*  BUN 55* 57* 52*  --  51* 50*  CREATININE 2.52* 2.21* 2.10*  --  2.14* 2.13*  CALCIUM 9.2 8.6* 8.9  --  9.3 9.5  MG  --   --   --  2.0  --   --     Liver Function Tests:  Recent Labs Lab 03/31/15 0430  AST 44*  ALT 27  ALKPHOS 72  BILITOT 1.0  PROT 6.3*   ALBUMIN 3.2*   No results for input(s): LIPASE, AMYLASE in the last 168 hours. No results for input(s): AMMONIA in the last 168 hours.  CBC:  Recent Labs Lab 03/31/15 0430 04/02/15 0004 04/03/15 0420  WBC 24.6* 13.4* 12.5*  NEUTROABS 21.9*  --   --   HGB 11.4* 10.2* 10.3*  HCT 35.2* 32.1* 32.4*  MCV 94.9 93.9 94.2  PLT 267 193 189    Cardiac Enzymes:  Recent Labs Lab 03/31/15 0430 04/02/15 0520 04/02/15 1330 04/03/15 0420  TROPONINI 0.06* 0.15* 0.13* 0.11*    BNP: BNP (last 3 results)  Recent Labs  03/13/15 1028 03/16/15 1619 03/31/15 0430  BNP 4332.8* >4500.0* 3300.6*    ProBNP (last 3 results) No results for input(s): PROBNP in the last 8760 hours.    Other results:  Imaging: No results found.   Medications:     Scheduled Medications: . apixaban  2.5 mg Oral BID  . docusate sodium  100 mg Oral BID  . insulin aspart  0-9 Units Subcutaneous TID WC  . piperacillin-tazobactam (ZOSYN)  IV  2.25 g Intravenous Q8H  . potassium chloride SA  40  mEq Oral BID  . vancomycin  1,000 mg Intravenous Q48H    Infusions: . sodium chloride 10 mL/hr at 04/02/15 1609  . amiodarone 30 mg/hr (04/05/15 2358)  . milrinone 0.25 mcg/kg/min (04/05/15 2125)    PRN Medications: sodium chloride   Assessment:   1. Hypoglycemic event 2. Possible aspiration PNA 3. Acute on chronic systolic HF requiring home milrinone. EF 25%. S/p CRT-D upgrade 5/16 4. PAF 5. Bioprosthetic MV/AoV 6. Depression  7. CKD, stage IV 8. DNR/DNI  Plan/Discussion:     She is back in NSR. With signficant volume overload on exam. Will need to keep today. On IV lasix and metolazone.  No labs yet for today so difficult to direct treatment.  Will consult with MD and await labs. To [name] when improved.    Length of Stay: 6 Graciella Freer MD 04/06/2015, 9:51 AM  Advanced Heart Failure Team Pager [phone] (M-F; 7a - 4p)  Please contact [facility] for  night-coverage after hours (4p -7a ) and weekends on [email or web address]  Patient seen and examined with Otilio Saber, PA-C. We discussed all aspects of the encounter. I agree with the assessment and plan as stated above.   She feels good. We reweighed her personally and weight is 238 (down 4 pounds). Volume still up. Will continue IV lasix today. Hopefully back to [name] on Monday.   Bensimhon, Daniel,MD 11:18 AM

## 2015-04-06 NOTE — Progress Notes (Signed)
[facility]                                                                              Patient Demographics  Joanna [name], is a 81 y.o. female, DOB - November 02, 1929, BJY:[id number]  Admit date - 03/31/2015   Admitting Physician Alison Murray, MD  Outpatient Primary MD for the patient is Sanda Linger, MD  LOS - 6   Chief Complaint  Patient presents with  . Hypoglycemia       Brief HPI   81yo F with chronic systolic congestive heart failure on chronic milrinone tx (TTE April 2016 EF 20-25%), atrial fibrillation, AVR/MVR 06/15/2013, hypertension, DM2, dyslipidemia, hospitalization for decompensated CHF from 03/16/2015 through 03/30/2015 during which time she had pacemaker placement who presented to [facility] ER from her SNF with CBGs in 20s. She had reports of lightheadedness, nausea but no vomiting. When she was given glucagon and dextrose her symptoms improved. In ED blood pressure was 83/49, heart rate 55, respiratory rate 12-21, afebrile and oxygen saturation 94% with nasal cannula oxygen support. Blood work was notable for white blood cell count of 24.6, hemoglobin 11.4, potassium 3.2 and creatinine 2.52. Troponin level was 0.06. 12 lead EKG showed underlying atrial fibrillation and left bundle branch block unchanged from previous tracings. CXR showed stable cardiomegaly, increased vascular congestion, possible pulmonary edema and increasing left middling consolidation possible edema or subcutaneous hematoma from recent pacemaker placement   Assessment & Plan    Sepsis due to L mid lung/basilar pneumonia/HCAP - Currently improving, chest x-ray showed persisting atelectasis +/- infiltrate plus probable effusion left base  - Clinically improving, pro-calcitonin improving, leukocytosis improving, no fevers - Blood cultures negative so far, urine cultures negative - will narrow antibiotics, afebrile, no acute respiratory symptoms, procalcitonin <0.1   Severe chronic  nonischemic systolic CHF on chronic milrinone s/p [employer] CRT-D 03/21/15 Cardiology following, continue milrinone drip Weight still up, still significantly volume overloaded, continue IV Lasix and Zaroxolyn.   Hypotension  Presented with hypotension, currently resolved, likely due to #1   Acute respiratory failure with hypoxia - Currently stable, O2 sats 98% on room air   Mod > severe Tricuspid regurgitation, biprostheatic aortic and mitral valve replacement  Chronic LBBB / biventricular pacemaker - Cardiology following, pacemaker interrogation done  Hypoglycemia in DM2 Reported to have CBG in the 20s, treated with IV dextrose. Possibly due to insulin, Januvia and sepsis at the time of admission with poor oral intake. - discontinue Januvia outpatient, currently no hypoglycemia.  -  add Lantus 10 units Daily  Troponin elevation Troponin currently trending down, cardiology following, patient denies any symptoms, chest pain  CKD stage 3 Baseline crt 2.4 - creatinine stable   Chronic PAF Rate controlled CHADS vasc score 6 - continue eliquis - presently well controlled  Code Status: DO NOT RESUSCITATE  Family Communication: Discussed in detail with the patient, all imaging results, lab results explained to the patient    Disposition Plan: Likely to skilled nursing facility on Monday  Time Spent in minutes   25 minutes  Procedures  None  Consults   Cardiology   DVT Prophylaxis  apixaban  Medications  Scheduled Meds: . apixaban  2.5 mg Oral BID  . docusate sodium  100 mg Oral BID  . furosemide  80 mg Intravenous BID  . insulin aspart  0-9 Units Subcutaneous TID WC  . metolazone  5 mg Oral Once  . piperacillin-tazobactam (ZOSYN)  IV  2.25 g Intravenous Q8H  . potassium chloride SA  40 mEq Oral BID  . vancomycin  1,000 mg Intravenous Q48H   Continuous Infusions: . sodium chloride 10 mL/hr at 04/02/15 1609  . amiodarone 30 mg/hr (04/05/15 2358)  . milrinone 0.25  mcg/kg/min (04/05/15 2125)   PRN Meds:.sodium chloride   Antibiotics   Anti-infectives    Start     Dose/Rate Route Frequency Ordered Stop   04/05/15 0630  vancomycin (VANCOCIN) IVPB 1000 mg/200 mL premix     1,000 mg 200 mL/hr over 60 Minutes Intravenous Every 48 hours 04/03/15 1030     04/03/15 0600  vancomycin (VANCOCIN) IVPB 1000 mg/200 mL premix  Status:  Discontinued     1,000 mg 200 mL/hr over 60 Minutes Intravenous every 72 hours 03/31/15 0749 04/03/15 1030   03/31/15 1800  piperacillin-tazobactam (ZOSYN) IVPB 2.25 g     2.25 g 100 mL/hr over 30 Minutes Intravenous Every 8 hours 03/31/15 1020     03/31/15 1000  piperacillin-tazobactam (ZOSYN) IVPB 3.375 g     3.375 g 100 mL/hr over 30 Minutes Intravenous  Once 03/31/15 0953 03/31/15 1043   03/31/15 0830  imipenem-cilastatin (PRIMAXIN) 250 mg in sodium chloride 0.9 % 100 mL IVPB  Status:  Discontinued     250 mg 200 mL/hr over 30 Minutes Intravenous Every 12 hours 03/31/15 0749 03/31/15 0953   03/31/15 0800  vancomycin (VANCOCIN) IVPB 1000 mg/200 mL premix     1,000 mg 200 mL/hr over 60 Minutes Intravenous  Once 03/31/15 0749 03/31/15 1113        Subjective:   Joanna [name] was seen and examined today.  Patient feels fine, denies any shortness of breath or chest pain. Still has +JVD, significant volume overload. Patient denies dizziness, abdominal pain, new weakness, numbess, tingling. No acute events overnight.  Just feeling weak today.  Objective:   Blood pressure 92/44, pulse 81, temperature 98 F (36.7 C), temperature source Oral, resp. rate 18, height  (1.626 m), weight 62.551 kg (137 lb 14.4 oz), SpO2 89 %.  Wt Readings from Last 3 Encounters:  04/06/15 62.551 kg (137 lb 14.4 oz)  03/30/15 60.2 kg (132 lb 11.5 oz)  03/13/15 66.225 kg (146 lb)     Intake/Output Summary (Last 24 hours) at 04/06/15 1132 Last data filed at 04/06/15 0932  Gross per 24 hour  Intake 1467.24 ml  Output   3100 ml  Net  -1632.76 ml    Exam  General: Alert and oriented x 3, NAD  HEENT:  PERRLA, EOMI  Neck: Supple, JVD +  CVS: S1 S2 auscultated, 2/6 SEM  Respiratory: Decreased breath sounds at the bases  Abdomen: Soft, nontender, distended, + bowel sounds  Ext: no cyanosis clubbing or edema  Neuro: no new deficits  Skin: No rashes  Psych: Normal affect and demeanor, alert and oriented x3    Data Review   Micro Results Recent Results (from the past 240 hour(s))  Culture, blood (routine x 2)     Status: None   Collection Time: 03/31/15  6:40 AM  Result Value Ref Range Status   Specimen Description BLOOD PICC LINE RIGHT ARM  Final  Special Requests BOTTLES DRAWN AEROBIC AND ANAEROBIC 5CC  Final   Culture   Final    NO GROWTH 5 DAYS Performed at [facility]    Report Status 04/06/2015 FINAL  Final  Culture, blood (routine x 2)     Status: None   Collection Time: 03/31/15  7:36 AM  Result Value Ref Range Status   Specimen Description BLOOD LEFT HAND  Final   Special Requests BOTTLES DRAWN AEROBIC AND ANAEROBIC 5CC  Final   Culture   Final    NO GROWTH 5 DAYS Performed at [facility]    Report Status 04/06/2015 FINAL  Final  Urine culture     Status: None   Collection Time: 03/31/15 11:52 AM  Result Value Ref Range Status   Specimen Description URINE, RANDOM  Final   Special Requests NONE  Final   Colony Count NO GROWTH Performed at [facility]   Final   Culture NO GROWTH Performed at [facility]   Final   Report Status 04/01/2015 FINAL  Final    Radiology Reports Dg Chest 2 View  03/16/2015   CLINICAL DATA:  Acute onset of chest pain and shortness of breath. Cough and congestion. Initial encounter.  EXAM: CHEST  2 VIEW  COMPARISON:  Chest radiograph performed 12/14/2014  FINDINGS: The lungs are well-aerated. A small left pleural effusion is noted. Chronically increased interstitial markings are noted. Mild peribronchial thickening is  noted. No pneumothorax is identified. Scarring is seen at the left lung apex.  The heart is enlarged. The patient is status post median sternotomy. A valve replacement is noted. Orphaned pacemaker leads are visualized. No acute osseous abnormalities are seen.  IMPRESSION: Small left pleural effusion noted. Chronically increased interstitial markings seen. Cardiomegaly.   Electronically Signed   By: Roanna Raider M.D.   On: 03/16/2015 17:26   Dg Ankle Complete Right  03/23/2015   CLINICAL DATA:  Post right ankle surgery 6 weeks ago.  EXAM: RIGHT ANKLE - COMPLETE 3+ VIEW  COMPARISON:  01/30/2015  FINDINGS: Plate and screw fixation device noted across the distal fibular shaft fracture. Medial malleolar screws in place across the medial malleolar fractures. Anatomic alignment. No acute bony abnormality. Fracture lines are difficult to visualize. Fine bony detail is obscured by overlying cast material.  IMPRESSION: Evidence of healing across the distal fibular shaft fracture and medial malleolar fractures. Fine bony detail obscured by overlying cast.   Electronically Signed   By: Charlett Nose M.D.   On: 03/23/2015 15:41   Ct Head Wo Contrast  03/17/2015   CLINICAL DATA:  Acute encephalopathy.  No headache.  EXAM: CT HEAD WITHOUT CONTRAST  TECHNIQUE: Contiguous axial images were obtained from the base of the skull through the vertex without intravenous contrast.  COMPARISON:  12/15/2014  FINDINGS: The ventricles are normal configuration. There is ventricular and sulcal enlargement reflecting mild to moderate age related atrophy. No hydrocephalus.  There are no parenchymal masses or mass effect. There is no evidence of a cortical infarct. Mild periventricular white matter hypoattenuation is noted consistent with chronic microvascular ischemic change.  There are no extra-axial masses or abnormal fluid collections.  No intracranial hemorrhage.  Sinuses and mastoid air cells are clear.  IMPRESSION: 1. No acute  intracranial abnormalities. 2. Mild to moderate atrophy. Mild chronic microvascular ischemic change.   Electronically Signed   By: Amie Portland M.D.   On: 03/17/2015 16:35   US Abdomen Complete  03/17/2015   CLINICAL  DATA:  Nausea and vomiting  EXAM: ULTRASOUND ABDOMEN COMPLETE  COMPARISON:  None.  FINDINGS: Gallbladder: Gallbladder wall thickening measuring 6 mm and pericholecystic fluid noted. Adherent sludge balls or polyps, largest 3 mm. No sonographic Murphy sign.  Common bile duct: Diameter: 7 mm  Liver: No focal lesion identified. Within normal limits in parenchymal echogenicity.  IVC: No abnormality visualized.  Pancreas: Visualized portion unremarkable.  Spleen: Size and appearance within normal limits.  Right Kidney: Length: 11.2 cm, increased renal cortical echogenicity. No hydronephrosis. Cortical cysts are noted, largest 1.5 x 1.4 x 1.4 cm.  Left Kidney: Length: 10.7 cm. Suboptimally visualized due to lack of optimal acoustic window with increased cortical echogenicity but no focal abnormality or mass. No hydronephrosis.  Abdominal aorta: No aneurysm visualized.  Other findings: Ascites and bilateral pleural effusions are noted. Incidental note is made of bladder distention, volume 1094 cc.  IMPRESSION: Increased renal cortical echogenicity suggesting medical renal disease, with right renal cysts.  Bladder distention.  Gallbladder wall thickening, pericholecystic fluid, and adherent sludge balls or polyps. If the patient has right upper quadrant abdominal pain, findings would be highly suspicious for acute cholecystitis. However, there is ascites and gallbladder wall thickening may also be secondary to liver disease or hypoproteinemia.  Pleural effusions and ascites.   Electronically Signed   By: Christiana Pellant M.D.   On: 03/17/2015 09:30   Dg Chest Port 1 View  04/03/2015   CLINICAL DATA:  Line placement.  PICC placement.  EXAM: PORTABLE CHEST - 1 VIEW  COMPARISON:  04/03/2015.  0917 hours   FINDINGS: RIGHT upper extremity PICC is present. The tip appears at the junction of the superior vena cava and RIGHT atrium.  Unchanged cardiomegaly. LEFT pleural effusion. Valvular replacement and median sternotomy. Aortic arch atherosclerosis. Mildly improved aeration at the RIGHT lung base.  IMPRESSION: RIGHT upper extremity PICC tip is at the junction of the superior vena cava and RIGHT atrium.   Electronically Signed   By: Andreas Newport M.D.   On: 04/03/2015 19:23   Dg Chest Port 1 View  04/03/2015   CLINICAL DATA:  81 year old female status post PICC line repositioning. Initial encounter.  EXAM: PORTABLE CHEST - 1 VIEW  COMPARISON:  0605 hours today and earlier.  FINDINGS: Portable AP semi upright view at 0917 hours.  On 04/02/2015 the right side PICC line tip was evident at the cavoatrial junction region. The tip now appears to terminate at the level of the third from the top median sternotomy wire, about 2 cm above the carina.  Superimposed left chest 3 lead cardiac pacemaker, epicardial leads, cardiac prosthetic bowels, cardiomegaly, calcified atherosclerosis of the aorta. Stable ventilation. No pneumothorax.  IMPRESSION: 1. Right PICC line appears pulled back, tip now at the upper SVC level. 2. Otherwise stable chest.   Electronically Signed   By: Odessa Fleming M.D.   On: 04/03/2015 09:29   Dg Chest Port 1 View  04/03/2015   CLINICAL DATA:  Pneumonia.  EXAM: PORTABLE CHEST - 1 VIEW  COMPARISON:  04/02/2015, 03/31/2015, 03/21/2015 and 12/14/2014  FINDINGS: Chronic cardiomegaly. Aortic and mitral valve prostheses. Pacemaker. PICC unchanged.  Pulmonary vascularity is now normal. Large left pleural effusion appears slightly improved with no residual infiltrate visible on the left. Probable small right effusion.  IMPRESSION: Slightly diminished left pleural effusion. Small right effusion, unchanged. No discrete infiltrates.   Electronically Signed   By: Francene Boyers M.D.   On: 04/03/2015 07:54   Dg Chest  Port 1  View  04/02/2015   CLINICAL DATA:  Pneumonia.  EXAM: PORTABLE CHEST - 1 VIEW  COMPARISON:  03/31/2015.  FINDINGS: Right PICC line in stable position. Cardiac pacer in stable position. Aortic valve replacement. Cardiomegaly with mild pulmonary venous congestion. Mild interstitial prominence is present. Left lower lobe infiltrate and/or atelectasis present. Left pleural effusion cannot be excluded. No pneumothorax. P  IMPRESSION: 1. Right PICC line in stable position. 2. Cardiac pacer in stable position. Aortic valve replacement. Cardiomegaly with persistent mild interstitial prominence suggesting mild congestive heart failure. 3. Prominent left lower lobe atelectasis and/or infiltrate. Left pleural effusion.   Electronically Signed   By: Maisie Fus  Register   On: 04/02/2015 07:24   Dg Chest Port 1 View  03/31/2015   CLINICAL DATA:  Hypotension. History of diabetes, hypertension, cardiac valve replacement.  EXAM: PORTABLE CHEST - 1 VIEW  COMPARISON:  Chest radiograph March 21, 2015  FINDINGS: The cardiac silhouette is moderately enlarged, unchanged. Calcified aortic knob. Status post mitral and aortic valve replacement and Common median sternotomy. Pulmonary vascular congestion increasing interstitial prominence. Patchy LEFT midlung zone consolidation with moderate LEFT pleural effusion. No pneumothorax.  RIGHT PICC distal tip projects in mid superior vena cava. Three lead LEFT cardiac pacemaker. The soft tissue planes included osseous structure nonsuspicious.  IMPRESSION: Stable cardiomegaly. Increasing pulmonary vascular congestion. Interstitial prominence concerning for pulmonary edema with increasing LEFT midlung zone consolidations/confluent edema versus subcutaneous hematoma from recent pacemaker placement. Moderate LEFT pleural effusion.  No apparent change in position of RIGHT PICC line.   Electronically Signed   By: Awilda Metro M.D.   On: 03/31/2015 04:42   Dg Chest Port 1 View  03/21/2015    CLINICAL DATA:  Pacemaker insertion today. Postprocedural radiograph.  EXAM: PORTABLE CHEST - 1 VIEW  COMPARISON:  03/20/2015.  FINDINGS: Support apparatus: Monitoring leads project over the chest. New pacemaker power pack with 3 LEFT subclavian leads. Leads appear in appropriate position with coronary sinus lead. Aortic valve replacements are present. Old epicardial pacing leads are present.  The cardiopericardial silhouette remains enlarged. There is bilateral interstitial and mild alveolar airspace disease compatible with pulmonary edema/volume overload. Aortic arch atherosclerosis.  IMPRESSION:  1. Interval placement of 3 lead LEFT subclavian pacemaker and power pack. 2. Negative for pneumothorax or complicating features. 3. Mild interstitial and alveolar pulmonary edema.   Electronically Signed   By: Andreas Newport M.D.   On: 03/21/2015 17:02   Dg Chest Port 1 View  03/20/2015   CLINICAL DATA:  PICC line placement  EXAM: PORTABLE CHEST - 1 VIEW  COMPARISON:  03/17/2015  FINDINGS: Right PICC line is in place. The tip is in the SVC. There is cardiomegaly. Devitalized left pacer wires remain in place, unchanged. Prior valve replacement. Left lower lobe opacity with left effusion again noted, unchanged.  IMPRESSION: Right PICC line tip in the SVC.  No change in the left lower lobe opacity and left effusion.   Electronically Signed   By: Charlett Nose M.D.   On: 03/20/2015 11:14   Dg Chest Port 1 View  03/17/2015   CLINICAL DATA:  Central line placement.  EXAM: PORTABLE CHEST - 1 VIEW  COMPARISON:  03/16/2015  FINDINGS: Patient is moderately rotated to the left. Sternotomy wires and prosthetic heart valve unchanged. Evidence of a right IJ central venous catheter with tip likely over the SVC accounting for patient's rotation. No evidence of pneumothorax. There is continued left base opacification unchanged to slightly worse likely effusions/ atelectasis and less  likely infection. Mild prominence of the  perihilar markings suggesting mild vascular congestion. Remainder of the exam is unchanged.  IMPRESSION: Slight worsening left base opacification likely effusion with atelectasis although cannot exclude infection.  Suggestion mild vascular congestion.  Right IJ central venous catheter with tip likely over the region of the SVC. No pneumothorax.   Electronically Signed   By: Elberta Fortis M.D.   On: 03/17/2015 12:51    CBC  Recent Labs Lab 03/31/15 0430 04/02/15 0004 04/03/15 0420  WBC 24.6* 13.4* 12.5*  HGB 11.4* 10.2* 10.3*  HCT 35.2* 32.1* 32.4*  PLT 267 193 189  MCV 94.9 93.9 94.2  MCH 30.7 29.8 29.9  MCHC 32.4 31.8 31.8  RDW 15.9* 16.1* 16.0*  LYMPHSABS 1.2  --   --   MONOABS 1.4*  --   --   EOSABS 0.1  --   --   BASOSABS 0.0  --   --     Chemistries   Recent Labs Lab 03/31/15 0430 04/01/15 0400 04/02/15 0520 04/02/15 1003 04/03/15 0420 04/04/15 0239  NA 136 136 137  --  137 136  K 3.2* 3.4* 3.2*  --  4.1 4.8  CL 94* 95* 98*  --  100* 102  CO2 29 30 30   --  28 23  GLUCOSE 148* 110* 164*  --  151* 174*  BUN 55* 57* 52*  --  51* 50*  CREATININE 2.52* 2.21* 2.10*  --  2.14* 2.13*  CALCIUM 9.2 8.6* 8.9  --  9.3 9.5  MG  --   --   --  2.0  --   --   AST 44*  --   --   --   --   --   ALT 27  --   --   --   --   --   ALKPHOS 72  --   --   --   --   --   BILITOT 1.0  --   --   --   --   --    ------------------------------------------------------------------------------------------------------------------ estimated creatinine clearance is 17.6 mL/min (by C-G formula based on Cr of 2.13). ------------------------------------------------------------------------------------------------------------------ No results for input(s): HGBA1C in the last 72 hours. ------------------------------------------------------------------------------------------------------------------ No results for input(s): CHOL, HDL, LDLCALC, TRIG, CHOLHDL, LDLDIRECT in the last 72  hours. ------------------------------------------------------------------------------------------------------------------ No results for input(s): TSH, T4TOTAL, T3FREE, THYROIDAB in the last 72 hours.  Invalid input(s): FREET3 ------------------------------------------------------------------------------------------------------------------ No results for input(s): VITAMINB12, FOLATE, FERRITIN, TIBC, IRON, RETICCTPCT in the last 72 hours.  Coagulation profile No results for input(s): INR, PROTIME in the last 168 hours.  No results for input(s): DDIMER in the last 72 hours.  Cardiac Enzymes  Recent Labs Lab 04/02/15 0520 04/02/15 1330 04/03/15 0420  TROPONINI 0.15* 0.13* 0.11*   ------------------------------------------------------------------------------------------------------------------ Invalid input(s): POCBNP   Recent Labs  04/04/15 2151 04/05/15 0800 04/05/15 1039 04/05/15 1557 04/05/15 2058 04/06/15 0609  GLUCAP 230* 218* 179* 187* 167* 161*     RAI,RIPUDEEP M.D. [facility] 04/06/2015, 11:32 AM  Pager: [phone]   Between 7am to 7pm - call Pager - [phone]  After 7pm go to [email or web address] - password [email or web address]  Call night coverage person covering after 7pm

## 2015-04-06 NOTE — Progress Notes (Signed)
CSW (Clinical Child psychotherapist) received call from facility inquiring about pt dc plan. CSW notified that per PA note from today plan will be to hold pt due to volume overload. CSW to keep facility updated.  Mindi Akerson, LCSWA [phone]

## 2015-04-06 NOTE — Progress Notes (Signed)
The patient had an uneventful night and stated that she slept well.

## 2015-04-06 NOTE — Progress Notes (Signed)
ANTIBIOTIC CONSULT NOTE   Pharmacy Consult for Vanco/Zosyn Indication: Sepsis/PNA  Allergies  Allergen Reactions  . Other Hives    STEROIDS  . Prednisone Hives and Other (See Comments)    She is allergic to all steroids!   Assessment: 81 year old female continues on Vancomycin and Zosyn for sepsis/pneumoia.   Afebrile, WBC stable Cultures negative Scr has improved PCT negative  Goal of Therapy:  Vancomycin trough level 15-20 mcg/ml  Plan:  Increase vancomycin to 1 gram iv Q 48 hours  Continue Zosyn 2.25 grams iv Q 8 hours  Follow up for de-escalation of antibiotics.  - Spoke to Dr. Isidoro Donning, will de-escalate today.  Labs:  Recent Labs  04/04/15 0239  CREATININE 2.13*   Estimated Creatinine Clearance: 19 mL/min (by C-G formula based on Cr of 2.13). No results for input(s): VANCOTROUGH, VANCOPEAK, VANCORANDOM, GENTTROUGH, GENTPEAK, GENTRANDOM, TOBRATROUGH, TOBRAPEAK, TOBRARND, AMIKACINPEAK, AMIKACINTROU, AMIKACIN in the last 72 hours.   Microbiology: Recent Results (from the past 720 hour(s))  Culture, blood (routine x 2)     Status: None   Collection Time: 03/16/15  4:18 PM  Result Value Ref Range Status   Specimen Description BLOOD LEFT WRIST  Final   Special Requests BOTTLES DRAWN AEROBIC ONLY 3CC  Final   Culture   Final    NO GROWTH 5 DAYS Performed at [facility]    Report Status 03/23/2015 FINAL  Final  Culture, blood (routine x 2)     Status: None   Collection Time: 03/16/15  4:21 PM  Result Value Ref Range Status   Specimen Description BLOOD RIGHT ARM  Final   Special Requests BOTTLES DRAWN AEROBIC AND ANAEROBIC 3CC  Final   Culture   Final    NO GROWTH 5 DAYS Performed at [facility]    Report Status 03/23/2015 FINAL  Final  Urine culture     Status: None   Collection Time: 03/16/15  5:44 PM  Result Value Ref Range Status   Specimen Description URINE, CLEAN CATCH  Final   Special Requests NONE  Final   Colony Count   Final   >=100,000 COLONIES/ML Performed at [facility]    Culture   Final    ESCHERICHIA COLI Performed at [facility]    Report Status 03/19/2015 FINAL  Final   Organism ID, Bacteria ESCHERICHIA COLI  Final      Susceptibility   Escherichia coli - MIC*    AMPICILLIN >=32 RESISTANT Resistant     CEFAZOLIN <=4 SENSITIVE Sensitive     CEFTRIAXONE <=1 SENSITIVE Sensitive     CIPROFLOXACIN <=0.25 SENSITIVE Sensitive     GENTAMICIN <=1 SENSITIVE Sensitive     LEVOFLOXACIN <=0.12 SENSITIVE Sensitive     NITROFURANTOIN <=16 SENSITIVE Sensitive     TOBRAMYCIN <=1 SENSITIVE Sensitive     TRIMETH/SULFA <=20 SENSITIVE Sensitive     PIP/TAZO <=4 SENSITIVE Sensitive     * ESCHERICHIA COLI  MRSA PCR Screening     Status: None   Collection Time: 03/16/15  7:57 PM  Result Value Ref Range Status   MRSA by PCR NEGATIVE NEGATIVE Final    Comment:        The GeneXpert MRSA Assay (FDA approved for NASAL specimens only), is one component of a comprehensive MRSA colonization surveillance program. It is not intended to diagnose MRSA infection nor to guide or monitor treatment for MRSA infections.   Culture, blood (routine x 2)     Status: None (Preliminary  result)   Collection Time: 03/31/15  6:40 AM  Result Value Ref Range Status   Specimen Description BLOOD PICC LINE RIGHT ARM  Final   Special Requests BOTTLES DRAWN AEROBIC AND ANAEROBIC 5CC  Final   Culture   Final           BLOOD CULTURE RECEIVED NO GROWTH TO DATE CULTURE WILL BE HELD FOR 5 DAYS BEFORE ISSUING A FINAL NEGATIVE REPORT Performed at [facility]    Report Status PENDING  Incomplete  Culture, blood (routine x 2)     Status: None (Preliminary result)   Collection Time: 03/31/15  7:36 AM  Result Value Ref Range Status   Specimen Description BLOOD LEFT HAND  Final   Special Requests BOTTLES DRAWN AEROBIC AND ANAEROBIC 5CC  Final   Culture   Final           BLOOD CULTURE RECEIVED NO GROWTH TO DATE CULTURE  WILL BE HELD FOR 5 DAYS BEFORE ISSUING A FINAL NEGATIVE REPORT Performed at [facility]    Report Status PENDING  Incomplete  Urine culture     Status: None   Collection Time: 03/31/15 11:52 AM  Result Value Ref Range Status   Specimen Description URINE, RANDOM  Final   Special Requests NONE  Final   Colony Count NO GROWTH Performed at [facility]   Final   Culture NO GROWTH Performed at [facility]   Final   Report Status 04/01/2015 FINAL  Final     Tad Moore, BCPS  Clinical Pharmacist Pager [phone]  04/06/2015 8:05 AM

## 2015-04-07 LAB — GLUCOSE, CAPILLARY
GLUCOSE-CAPILLARY: 205 mg/dL — AB (ref 65–99)
Glucose-Capillary: 68 mg/dL (ref 65–99)
Glucose-Capillary: 128 mg/dL — ABNORMAL HIGH (ref 65–99)
Glucose-Capillary: 176 mg/dL — ABNORMAL HIGH (ref 65–99)

## 2015-04-07 LAB — BASIC METABOLIC PANEL
ANION GAP: 13 (ref 5–15)
BUN: 48 mg/dL — ABNORMAL HIGH (ref 6–20)
CHLORIDE: 97 mmol/L — AB (ref 101–111)
CO2: 25 mmol/L (ref 22–32)
CREATININE: 1.98 mg/dL — AB (ref 0.44–1.00)
Calcium: 9.1 mg/dL (ref 8.9–10.3)
GFR, EST AFRICAN AMERICAN: 26 mL/min — AB (ref >60)
GFR, EST NON AFRICAN AMERICAN: 22 mL/min — AB (ref >60)
Glucose, Bld: 138 mg/dL — ABNORMAL HIGH (ref 65–99)
Potassium: 3.4 mmol/L — ABNORMAL LOW (ref 3.5–5.1)
SODIUM: 135 mmol/L (ref 135–145)

## 2015-04-07 MED ORDER — POTASSIUM CHLORIDE CRYS ER 20 MEQ PO TBCR
20.0000 meq | EXTENDED_RELEASE_TABLET | Freq: Once | ORAL | Status: AC
  Administered 2015-04-07: 20 meq via ORAL
  Filled 2015-04-07: qty 1

## 2015-04-07 NOTE — Progress Notes (Signed)
Advanced Heart Failure Rounding Note   Subjective:     Says she feels great. Diuresing well. Weight back down to about baseline. Denies dyspnea.    Objective:   Weight Range:  Vital Signs:   Temp:  [97.6 F (36.4 C)-98.3 F (36.8 C)] 97.6 F (36.4 C) (06/18 0653) Pulse Rate:  [78-87] 78 (06/18 0653) Resp:  [16-20] 16 (06/18 0653) BP: (92-111)/(43-64) 111/43 mmHg (06/18 0653) SpO2:  [89 %-98 %] 91 % (06/18 0653) Weight:  [59.875 kg (132 lb)-62.551 kg (137 lb 14.4 oz)] 59.875 kg (132 lb) (06/18 0653) Last BM Date: 04/06/15  Weight change: Filed Weights   04/06/15 0450 04/06/15 1100 04/07/15 0653  Weight: 65.681 kg (144 lb 12.8 oz) 62.551 kg (137 lb 14.4 oz) 59.875 kg (132 lb)    Intake/Output:   Intake/Output Summary (Last 24 hours) at 04/07/15 0852 Last data filed at 04/07/15 0840  Gross per 24 hour  Intake 3353.99 ml  Output   4700 ml  Net -1346.01 ml     Physical Exam: General: Elderly woman. Lying in bed  NAD Neck: JVP j8-9 prominent CV waves  no thyromegaly or thyroid nodule.  Lungs: clear but decreased at bases CV: Nondisplaced PMI. Heart regular S1/S2, 2/6 SEM RUSB. trace edema.  Healing sore on L shin Abdomen: Soft, nontender, no hepatosplenomegaly, + mild distention.  Neurologic: Alert and oriented x 3.  Psych: tearful at times Extremities: No clubbing or cyanosis.   TELEMETRY: Reviewed telemetry pt in NSR with v-pacing and occasional PVCs.  Labs: Basic Metabolic Panel:  Recent Labs Lab 04/01/15 0400 04/02/15 0520 04/02/15 1003 04/03/15 0420 04/04/15 0239 04/06/15 1050  NA 136 137  --  137 136 137  K 3.4* 3.2*  --  4.1 4.8 3.5  CL 95* 98*  --  100* 102 101  CO2 30 30  --  28 23 26   GLUCOSE 110* 164*  --  151* 174* 198*  BUN 57* 52*  --  51* 50* 49*  CREATININE 2.21* 2.10*  --  2.14* 2.13* 1.90*  CALCIUM 8.6* 8.9  --  9.3 9.5 9.2  MG  --   --  2.0  --   --   --     Liver Function Tests: No results for input(s): AST, ALT, ALKPHOS,  BILITOT, PROT, ALBUMIN in the last 168 hours. No results for input(s): LIPASE, AMYLASE in the last 168 hours. No results for input(s): AMMONIA in the last 168 hours.  CBC:  Recent Labs Lab 04/02/15 0004 04/03/15 0420  WBC 13.4* 12.5*  HGB 10.2* 10.3*  HCT 32.1* 32.4*  MCV 93.9 94.2  PLT 193 189    Cardiac Enzymes:  Recent Labs Lab 04/02/15 0520 04/02/15 1330 04/03/15 0420  TROPONINI 0.15* 0.13* 0.11*    BNP: BNP (last 3 results)  Recent Labs  03/13/15 1028 03/16/15 1619 03/31/15 0430  BNP 4332.8* >4500.0* 3300.6*    ProBNP (last 3 results) No results for input(s): PROBNP in the last 8760 hours.    Other results:  Imaging: No results found.   Medications:     Scheduled Medications: . apixaban  2.5 mg Oral BID  . docusate sodium  100 mg Oral BID  . furosemide  80 mg Intravenous BID  . insulin aspart  0-9 Units Subcutaneous TID WC  . insulin glargine  10 Units Subcutaneous QHS  . [START ON 04/08/2015] levofloxacin  500 mg Oral Q48H  . potassium chloride SA  40 mEq Oral BID    Infusions: .  sodium chloride 10 mL/hr at 04/07/15 0626  . amiodarone 30 mg/hr (04/07/15 0626)  . milrinone 0.25 mcg/kg/min (04/06/15 1438)    PRN Medications: sodium chloride   Assessment:   1. Hypoglycemic event 2. Possible aspiration PNA 3. Acute on chronic systolic HF requiring home milrinone. EF 25%. S/p CRT-D upgrade 5/16 4. PAF 5. Bioprosthetic MV/AoV 6. Depression  7. CKD, stage IV 8. DNR/DNI  Plan/Discussion:    She feels good. Volume status much improved. Needs BMET. Will continue IV lasix today. Hopefully back to [name] on Monday.    Length of Stay: 7 Arvilla Meres MD 04/07/2015, 8:52 AM  Advanced Heart Failure Team Pager [phone] (M-F; 7a - 4p)  Please contact [facility] for night-coverage after hours (4p -7a ) and weekends on [email or web address]

## 2015-04-07 NOTE — Progress Notes (Signed)
[facility]                                                                              Patient Demographics  Joanna [name], is a 81 y.o. female, DOB - April 24, 1930, ZOX:[id number]  Admit date - 03/31/2015   Admitting Physician Alison Murray, MD  Outpatient Primary MD for the patient is Sanda Linger, MD  LOS - 7   Chief Complaint  Patient presents with  . Hypoglycemia       Brief HPI   80yo F with chronic systolic congestive heart failure on chronic milrinone tx (TTE April 2016 EF 20-25%), atrial fibrillation, AVR/MVR 06/15/2013, hypertension, DM2, dyslipidemia, hospitalization for decompensated CHF from 03/16/2015 through 03/30/2015 during which time she had pacemaker placement who presented to [facility] ER from her SNF with CBGs in 20s. She had reports of lightheadedness, nausea but no vomiting. When she was given glucagon and dextrose her symptoms improved. In ED blood pressure was 83/49, heart rate 55, respiratory rate 12-21, afebrile and oxygen saturation 94% with nasal cannula oxygen support. Blood work was notable for white blood cell count of 24.6, hemoglobin 11.4, potassium 3.2 and creatinine 2.52. Troponin level was 0.06. 12 lead EKG showed underlying atrial fibrillation and left bundle branch block unchanged from previous tracings. CXR showed stable cardiomegaly, increased vascular congestion, possible pulmonary edema and increasing left middling consolidation possible edema or subcutaneous hematoma from recent pacemaker placement   Assessment & Plan    Sepsis due to L mid lung/basilar pneumonia/HCAP - Currently improving, chest x-ray showed persisting atelectasis +/- infiltrate plus probable effusion left base  - Clinically improving, pro-calcitonin improving, leukocytosis improving, no fevers - Blood cultures negative so far, urine cultures negative - Continue Levaquin,   Severe chronic nonischemic systolic CHF on chronic milrinone s/p [employer] Jude CRT-D  03/21/15 Cardiology following, continue milrinone drip Weight finally down today 132lbs, a raising well, continue IV Lasix   Hypotension  Presented with hypotension, currently resolved, likely due to #1   Acute respiratory failure with hypoxia - Currently stable, no hypoxia, O2 sats 99% on room air  Mod > severe Tricuspid regurgitation, biprostheatic aortic and mitral valve replacement  Chronic LBBB / biventricular pacemaker - Cardiology following, pacemaker interrogation done  Hypoglycemia in DM2 Reported to have CBG in the 20s, treated with IV dextrose. Possibly due to insulin, Januvia and sepsis at the time of admission with poor oral intake. - discontinue Januvia outpatient, currently no hypoglycemia.  -  add Lantus 10 units Daily  Troponin elevation Troponin currently trending down, cardiology following, patient denies any symptoms, chest pain  CKD stage 3 Baseline crt 2.4 - creatinine stable   Chronic PAF Rate controlled CHADS vasc score 6 - continue eliquis - presently well controlled  Code Status: DO NOT RESUSCITATE  Family Communication: Discussed in detail with the patient, all imaging results, lab results explained to the patient    Disposition Plan: Likely to skilled nursing facility on Monday  Time Spent in minutes   25 minutes  Procedures  None  Consults   Cardiology   DVT Prophylaxis  apixaban  Medications  Scheduled Meds: . apixaban  2.5 mg Oral BID  . docusate sodium  100 mg Oral BID  . furosemide  80 mg Intravenous BID  . insulin aspart  0-9 Units Subcutaneous TID WC  . insulin glargine  10 Units Subcutaneous QHS  . [START ON 04/08/2015] levofloxacin  500 mg Oral Q48H  . potassium chloride SA  40 mEq Oral BID   Continuous Infusions: . sodium chloride 10 mL/hr at 04/07/15 0626  . amiodarone 30 mg/hr (04/07/15 0626)  . milrinone 0.25 mcg/kg/min (04/06/15 1438)   PRN Meds:.sodium chloride   Antibiotics   Anti-infectives    Start      Dose/Rate Route Frequency Ordered Stop   04/08/15 0800  levofloxacin (LEVAQUIN) tablet 500 mg     500 mg Oral Every 48 hours 04/06/15 1141     04/06/15 1145  levofloxacin (LEVAQUIN) tablet 750 mg     750 mg Oral  Once 04/06/15 1138 04/06/15 1239   04/05/15 0630  vancomycin (VANCOCIN) IVPB 1000 mg/200 mL premix  Status:  Discontinued     1,000 mg 200 mL/hr over 60 Minutes Intravenous Every 48 hours 04/03/15 1030 04/06/15 1138   04/03/15 0600  vancomycin (VANCOCIN) IVPB 1000 mg/200 mL premix  Status:  Discontinued     1,000 mg 200 mL/hr over 60 Minutes Intravenous every 72 hours 03/31/15 0749 04/03/15 1030   03/31/15 1800  piperacillin-tazobactam (ZOSYN) IVPB 2.25 g  Status:  Discontinued     2.25 g 100 mL/hr over 30 Minutes Intravenous Every 8 hours 03/31/15 1020 04/06/15 1138   03/31/15 1000  piperacillin-tazobactam (ZOSYN) IVPB 3.375 g     3.375 g 100 mL/hr over 30 Minutes Intravenous  Once 03/31/15 0953 03/31/15 1043   03/31/15 0830  imipenem-cilastatin (PRIMAXIN) 250 mg in sodium chloride 0.9 % 100 mL IVPB  Status:  Discontinued     250 mg 200 mL/hr over 30 Minutes Intravenous Every 12 hours 03/31/15 0749 03/31/15 0953   03/31/15 0800  vancomycin (VANCOCIN) IVPB 1000 mg/200 mL premix     1,000 mg 200 mL/hr over 60 Minutes Intravenous  Once 03/31/15 0749 03/31/15 1113        Subjective:   Joanna [name] was seen and examined today. Feeling overall improving, no hypoxia, no chest pain, no shortness of breath.  Patient denies dizziness, abdominal pain, new weakness, numbess, tingling. No acute events overnight.    Objective:   Blood pressure 97/58, pulse 78, temperature 97.6 F (36.4 C), temperature source Oral, resp. rate 16, height 5\' 4"  (1.626 m), weight 59.875 kg (132 lb), SpO2 99 %.  Wt Readings from Last 3 Encounters:  04/07/15 59.875 kg (132 lb)  03/30/15 60.2 kg (132 lb 11.5 oz)  03/13/15 66.225 kg (146 lb)     Intake/Output Summary (Last 24 hours) at 04/07/15  1248 Last data filed at 04/07/15 1130  Gross per 24 hour  Intake 3073.99 ml  Output   5000 ml  Net -1926.01 ml    Exam  General: Alert and oriented x 3, NAD  HEENT:  PERRLA, EOMI  Neck: Supple, JVD + improving  CVS: S1 S2 auscultated, 2/6 SEM  Respiratory: Decreased breath sounds at the bases  Abdomen: Soft, nontender, distended, + bowel sounds  Ext: no cyanosis clubbing or edema  Neuro: no new deficits  Skin: No rashes  Psych: Normal affect and demeanor, alert and oriented x3    Data Review   Micro Results Recent Results (from the past 240 hour(s))  Culture, blood (routine x 2)  Status: None   Collection Time: 03/31/15  6:40 AM  Result Value Ref Range Status   Specimen Description BLOOD PICC LINE RIGHT ARM  Final   Special Requests BOTTLES DRAWN AEROBIC AND ANAEROBIC 5CC  Final   Culture   Final    NO GROWTH 5 DAYS Performed at [facility]    Report Status 04/06/2015 FINAL  Final  Culture, blood (routine x 2)     Status: None   Collection Time: 03/31/15  7:36 AM  Result Value Ref Range Status   Specimen Description BLOOD LEFT HAND  Final   Special Requests BOTTLES DRAWN AEROBIC AND ANAEROBIC 5CC  Final   Culture   Final    NO GROWTH 5 DAYS Performed at [facility]    Report Status 04/06/2015 FINAL  Final  Urine culture     Status: None   Collection Time: 03/31/15 11:52 AM  Result Value Ref Range Status   Specimen Description URINE, RANDOM  Final   Special Requests NONE  Final   Colony Count NO GROWTH Performed at [facility]   Final   Culture NO GROWTH Performed at [facility]   Final   Report Status 04/01/2015 FINAL  Final    Radiology Reports Dg Chest 2 View  03/16/2015   CLINICAL DATA:  Acute onset of chest pain and shortness of breath. Cough and congestion. Initial encounter.  EXAM: CHEST  2 VIEW  COMPARISON:  Chest radiograph performed 12/14/2014  FINDINGS: The lungs are well-aerated. A small left  pleural effusion is noted. Chronically increased interstitial markings are noted. Mild peribronchial thickening is noted. No pneumothorax is identified. Scarring is seen at the left lung apex.  The heart is enlarged. The patient is status post median sternotomy. A valve replacement is noted. Orphaned pacemaker leads are visualized. No acute osseous abnormalities are seen.  IMPRESSION: Small left pleural effusion noted. Chronically increased interstitial markings seen. Cardiomegaly.   Electronically Signed   By: Roanna Raider M.D.   On: 03/16/2015 17:26   Dg Ankle Complete Right  03/23/2015   CLINICAL DATA:  Post right ankle surgery 6 weeks ago.  EXAM: RIGHT ANKLE - COMPLETE 3+ VIEW  COMPARISON:  01/30/2015  FINDINGS: Plate and screw fixation device noted across the distal fibular shaft fracture. Medial malleolar screws in place across the medial malleolar fractures. Anatomic alignment. No acute bony abnormality. Fracture lines are difficult to visualize. Fine bony detail is obscured by overlying cast material.  IMPRESSION: Evidence of healing across the distal fibular shaft fracture and medial malleolar fractures. Fine bony detail obscured by overlying cast.   Electronically Signed   By: Charlett Nose M.D.   On: 03/23/2015 15:41   Ct Head Wo Contrast  03/17/2015   CLINICAL DATA:  Acute encephalopathy.  No headache.  EXAM: CT HEAD WITHOUT CONTRAST  TECHNIQUE: Contiguous axial images were obtained from the base of the skull through the vertex without intravenous contrast.  COMPARISON:  12/15/2014  FINDINGS: The ventricles are normal configuration. There is ventricular and sulcal enlargement reflecting mild to moderate age related atrophy. No hydrocephalus.  There are no parenchymal masses or mass effect. There is no evidence of a cortical infarct. Mild periventricular white matter hypoattenuation is noted consistent with chronic microvascular ischemic change.  There are no extra-axial masses or abnormal fluid  collections.  No intracranial hemorrhage.  Sinuses and mastoid air cells are clear.  IMPRESSION: 1. No acute intracranial abnormalities. 2. Mild to moderate atrophy. Mild chronic  microvascular ischemic change.   Electronically Signed   By: Amie Portland M.D.   On: 03/17/2015 16:35   US Abdomen Complete  03/17/2015   CLINICAL DATA:  Nausea and vomiting  EXAM: ULTRASOUND ABDOMEN COMPLETE  COMPARISON:  None.  FINDINGS: Gallbladder: Gallbladder wall thickening measuring 6 mm and pericholecystic fluid noted. Adherent sludge balls or polyps, largest 3 mm. No sonographic Murphy sign.  Common bile duct: Diameter: 7 mm  Liver: No focal lesion identified. Within normal limits in parenchymal echogenicity.  IVC: No abnormality visualized.  Pancreas: Visualized portion unremarkable.  Spleen: Size and appearance within normal limits.  Right Kidney: Length: 11.2 cm, increased renal cortical echogenicity. No hydronephrosis. Cortical cysts are noted, largest 1.5 x 1.4 x 1.4 cm.  Left Kidney: Length: 10.7 cm. Suboptimally visualized due to lack of optimal acoustic window with increased cortical echogenicity but no focal abnormality or mass. No hydronephrosis.  Abdominal aorta: No aneurysm visualized.  Other findings: Ascites and bilateral pleural effusions are noted. Incidental note is made of bladder distention, volume 1094 cc.  IMPRESSION: Increased renal cortical echogenicity suggesting medical renal disease, with right renal cysts.  Bladder distention.  Gallbladder wall thickening, pericholecystic fluid, and adherent sludge balls or polyps. If the patient has right upper quadrant abdominal pain, findings would be highly suspicious for acute cholecystitis. However, there is ascites and gallbladder wall thickening may also be secondary to liver disease or hypoproteinemia.  Pleural effusions and ascites.   Electronically Signed   By: Christiana Pellant M.D.   On: 03/17/2015 09:30   Dg Chest Port 1 View  04/03/2015   CLINICAL  DATA:  Line placement.  PICC placement.  EXAM: PORTABLE CHEST - 1 VIEW  COMPARISON:  04/03/2015.  0917 hours  FINDINGS: RIGHT upper extremity PICC is present. The tip appears at the junction of the superior vena cava and RIGHT atrium.  Unchanged cardiomegaly. LEFT pleural effusion. Valvular replacement and median sternotomy. Aortic arch atherosclerosis. Mildly improved aeration at the RIGHT lung base.  IMPRESSION: RIGHT upper extremity PICC tip is at the junction of the superior vena cava and RIGHT atrium.   Electronically Signed   By: Andreas Newport M.D.   On: 04/03/2015 19:23   Dg Chest Port 1 View  04/03/2015   CLINICAL DATA:  81 year old female status post PICC line repositioning. Initial encounter.  EXAM: PORTABLE CHEST - 1 VIEW  COMPARISON:  0605 hours today and earlier.  FINDINGS: Portable AP semi upright view at 0917 hours.  On 04/02/2015 the right side PICC line tip was evident at the cavoatrial junction region. The tip now appears to terminate at the level of the third from the top median sternotomy wire, about 2 cm above the carina.  Superimposed left chest 3 lead cardiac pacemaker, epicardial leads, cardiac prosthetic bowels, cardiomegaly, calcified atherosclerosis of the aorta. Stable ventilation. No pneumothorax.  IMPRESSION: 1. Right PICC line appears pulled back, tip now at the upper SVC level. 2. Otherwise stable chest.   Electronically Signed   By: Odessa Fleming M.D.   On: 04/03/2015 09:29   Dg Chest Port 1 View  04/03/2015   CLINICAL DATA:  Pneumonia.  EXAM: PORTABLE CHEST - 1 VIEW  COMPARISON:  04/02/2015, 03/31/2015, 03/21/2015 and 12/14/2014  FINDINGS: Chronic cardiomegaly. Aortic and mitral valve prostheses. Pacemaker. PICC unchanged.  Pulmonary vascularity is now normal. Large left pleural effusion appears slightly improved with no residual infiltrate visible on the left. Probable small right effusion.  IMPRESSION: Slightly diminished left pleural effusion. Small  right effusion, unchanged.  No discrete infiltrates.   Electronically Signed   By: Francene Boyers M.D.   On: 04/03/2015 07:54   Dg Chest Port 1 View  04/02/2015   CLINICAL DATA:  Pneumonia.  EXAM: PORTABLE CHEST - 1 VIEW  COMPARISON:  03/31/2015.  FINDINGS: Right PICC line in stable position. Cardiac pacer in stable position. Aortic valve replacement. Cardiomegaly with mild pulmonary venous congestion. Mild interstitial prominence is present. Left lower lobe infiltrate and/or atelectasis present. Left pleural effusion cannot be excluded. No pneumothorax. P  IMPRESSION: 1. Right PICC line in stable position. 2. Cardiac pacer in stable position. Aortic valve replacement. Cardiomegaly with persistent mild interstitial prominence suggesting mild congestive heart failure. 3. Prominent left lower lobe atelectasis and/or infiltrate. Left pleural effusion.   Electronically Signed   By: Maisie Fus  Register   On: 04/02/2015 07:24   Dg Chest Port 1 View  03/31/2015   CLINICAL DATA:  Hypotension. History of diabetes, hypertension, cardiac valve replacement.  EXAM: PORTABLE CHEST - 1 VIEW  COMPARISON:  Chest radiograph March 21, 2015  FINDINGS: The cardiac silhouette is moderately enlarged, unchanged. Calcified aortic knob. Status post mitral and aortic valve replacement and Common median sternotomy. Pulmonary vascular congestion increasing interstitial prominence. Patchy LEFT midlung zone consolidation with moderate LEFT pleural effusion. No pneumothorax.  RIGHT PICC distal tip projects in mid superior vena cava. Three lead LEFT cardiac pacemaker. The soft tissue planes included osseous structure nonsuspicious.  IMPRESSION: Stable cardiomegaly. Increasing pulmonary vascular congestion. Interstitial prominence concerning for pulmonary edema with increasing LEFT midlung zone consolidations/confluent edema versus subcutaneous hematoma from recent pacemaker placement. Moderate LEFT pleural effusion.  No apparent change in position of RIGHT PICC line.    Electronically Signed   By: Awilda Metro M.D.   On: 03/31/2015 04:42   Dg Chest Port 1 View  03/21/2015   CLINICAL DATA:  Pacemaker insertion today. Postprocedural radiograph.  EXAM: PORTABLE CHEST - 1 VIEW  COMPARISON:  03/20/2015.  FINDINGS: Support apparatus: Monitoring leads project over the chest. New pacemaker power pack with 3 LEFT subclavian leads. Leads appear in appropriate position with coronary sinus lead. Aortic valve replacements are present. Old epicardial pacing leads are present.  The cardiopericardial silhouette remains enlarged. There is bilateral interstitial and mild alveolar airspace disease compatible with pulmonary edema/volume overload. Aortic arch atherosclerosis.  IMPRESSION:  1. Interval placement of 3 lead LEFT subclavian pacemaker and power pack. 2. Negative for pneumothorax or complicating features. 3. Mild interstitial and alveolar pulmonary edema.   Electronically Signed   By: Andreas Newport M.D.   On: 03/21/2015 17:02   Dg Chest Port 1 View  03/20/2015   CLINICAL DATA:  PICC line placement  EXAM: PORTABLE CHEST - 1 VIEW  COMPARISON:  03/17/2015  FINDINGS: Right PICC line is in place. The tip is in the SVC. There is cardiomegaly. Devitalized left pacer wires remain in place, unchanged. Prior valve replacement. Left lower lobe opacity with left effusion again noted, unchanged.  IMPRESSION: Right PICC line tip in the SVC.  No change in the left lower lobe opacity and left effusion.   Electronically Signed   By: Charlett Nose M.D.   On: 03/20/2015 11:14   Dg Chest Port 1 View  03/17/2015   CLINICAL DATA:  Central line placement.  EXAM: PORTABLE CHEST - 1 VIEW  COMPARISON:  03/16/2015  FINDINGS: Patient is moderately rotated to the left. Sternotomy wires and prosthetic heart valve unchanged. Evidence of a right IJ central venous catheter  with tip likely over the SVC accounting for patient's rotation. No evidence of pneumothorax. There is continued left base opacification  unchanged to slightly worse likely effusions/ atelectasis and less likely infection. Mild prominence of the perihilar markings suggesting mild vascular congestion. Remainder of the exam is unchanged.  IMPRESSION: Slight worsening left base opacification likely effusion with atelectasis although cannot exclude infection.  Suggestion mild vascular congestion.  Right IJ central venous catheter with tip likely over the region of the SVC. No pneumothorax.   Electronically Signed   By: Elberta Fortis M.D.   On: 03/17/2015 12:51    CBC  Recent Labs Lab 04/02/15 0004 04/03/15 0420  WBC 13.4* 12.5*  HGB 10.2* 10.3*  HCT 32.1* 32.4*  PLT 193 189  MCV 93.9 94.2  MCH 29.8 29.9  MCHC 31.8 31.8  RDW 16.1* 16.0*    Chemistries   Recent Labs Lab 04/02/15 0520 04/02/15 1003 04/03/15 0420 04/04/15 0239 04/06/15 1050 04/07/15 1027  NA 137  --  137 136 137 135  K 3.2*  --  4.1 4.8 3.5 3.4*  CL 98*  --  100* 102 101 97*  CO2 30  --  28 23 26 25   GLUCOSE 164*  --  151* 174* 198* 138*  BUN 52*  --  51* 50* 49* 48*  CREATININE 2.10*  --  2.14* 2.13* 1.90* 1.98*  CALCIUM 8.9  --  9.3 9.5 9.2 9.1  MG  --  2.0  --   --   --   --    ------------------------------------------------------------------------------------------------------------------ estimated creatinine clearance is 18.9 mL/min (by C-G formula based on Cr of 1.98). ------------------------------------------------------------------------------------------------------------------ No results for input(s): HGBA1C in the last 72 hours. ------------------------------------------------------------------------------------------------------------------ No results for input(s): CHOL, HDL, LDLCALC, TRIG, CHOLHDL, LDLDIRECT in the last 72 hours. ------------------------------------------------------------------------------------------------------------------ No results for input(s): TSH, T4TOTAL, T3FREE, THYROIDAB in the last 72 hours.  Invalid  input(s): FREET3 ------------------------------------------------------------------------------------------------------------------ No results for input(s): VITAMINB12, FOLATE, FERRITIN, TIBC, IRON, RETICCTPCT in the last 72 hours.  Coagulation profile No results for input(s): INR, PROTIME in the last 168 hours.  No results for input(s): DDIMER in the last 72 hours.  Cardiac Enzymes  Recent Labs Lab 04/02/15 0520 04/02/15 1330 04/03/15 0420  TROPONINI 0.15* 0.13* 0.11*   ------------------------------------------------------------------------------------------------------------------ Invalid input(s): POCBNP   Recent Labs  04/06/15 0609 04/06/15 1142 04/06/15 1618 04/06/15 2129 04/07/15 0651 04/07/15 1111  GLUCAP 161* 184* 166* 159* 68 128*     Dominique Calvey M.D. [facility] 04/07/2015, 12:48 PM  Pager: [phone]   Between 7am to 7pm - call Pager - [phone]  After 7pm go to [email or web address] - password [email or web address]  Call night coverage person covering after 7pm

## 2015-04-08 DIAGNOSIS — E876 Hypokalemia: Secondary | ICD-10-CM

## 2015-04-08 LAB — BASIC METABOLIC PANEL
Anion gap: 9 (ref 5–15)
BUN: 45 mg/dL — ABNORMAL HIGH (ref 6–20)
CO2: 33 mmol/L — AB (ref 22–32)
CREATININE: 1.87 mg/dL — AB (ref 0.44–1.00)
Calcium: 9.6 mg/dL (ref 8.9–10.3)
Chloride: 97 mmol/L — ABNORMAL LOW (ref 101–111)
GFR calc Af Amer: 28 mL/min — ABNORMAL LOW (ref >60)
GFR calc non Af Amer: 24 mL/min — ABNORMAL LOW (ref >60)
Glucose, Bld: 91 mg/dL (ref 65–99)
Potassium: 2.8 mmol/L — ABNORMAL LOW (ref 3.5–5.1)
SODIUM: 139 mmol/L (ref 135–145)

## 2015-04-08 LAB — GLUCOSE, CAPILLARY
GLUCOSE-CAPILLARY: 56 mg/dL — AB (ref 65–99)
GLUCOSE-CAPILLARY: 118 mg/dL — AB (ref 65–99)
GLUCOSE-CAPILLARY: 191 mg/dL — AB (ref 65–99)
Glucose-Capillary: 111 mg/dL — ABNORMAL HIGH (ref 65–99)

## 2015-04-08 MED ORDER — FUROSEMIDE 40 MG PO TABS
60.0000 mg | ORAL_TABLET | Freq: Every day | ORAL | Status: DC
  Administered 2015-04-08: 60 mg via ORAL
  Filled 2015-04-08 (×2): qty 1

## 2015-04-08 MED ORDER — POTASSIUM CHLORIDE CRYS ER 20 MEQ PO TBCR
40.0000 meq | EXTENDED_RELEASE_TABLET | Freq: Once | ORAL | Status: AC
  Administered 2015-04-08: 40 meq via ORAL
  Filled 2015-04-08: qty 2

## 2015-04-08 MED ORDER — POTASSIUM CHLORIDE CRYS ER 20 MEQ PO TBCR
40.0000 meq | EXTENDED_RELEASE_TABLET | Freq: Once | ORAL | Status: AC
Start: 2015-04-08 — End: 2015-04-08
  Administered 2015-04-08: 40 meq via ORAL

## 2015-04-08 MED ORDER — POTASSIUM CHLORIDE CRYS ER 20 MEQ PO TBCR: 40.0000 meq | EXTENDED_RELEASE_TABLET | Freq: Once | ORAL | Status: DC

## 2015-04-08 NOTE — Progress Notes (Signed)
[facility]                                                                              Patient Demographics  Joanna [name], is a 81 y.o. female, DOB - 02/16/1930, ZOX:[id number]  Admit date - 03/31/2015   Admitting Physician Alison Murray, MD  Outpatient Primary MD for the patient is Sanda Linger, MD  LOS - 8   Chief Complaint  Patient presents with  . Hypoglycemia       Brief HPI   81yo F with chronic systolic congestive heart failure on chronic milrinone tx (TTE April 2016 EF 20-25%), atrial fibrillation, AVR/MVR 06/15/2013, hypertension, DM2, dyslipidemia, hospitalization for decompensated CHF from 03/16/2015 through 03/30/2015 during which time she had pacemaker placement who presented to [facility] ER from her SNF with CBGs in 20s. She had reports of lightheadedness, nausea but no vomiting. When she was given glucagon and dextrose her symptoms improved. In ED blood pressure was 83/49, heart rate 55, respiratory rate 12-21, afebrile and oxygen saturation 94% with nasal cannula oxygen support. Blood work was notable for white blood cell count of 24.6, hemoglobin 11.4, potassium 3.2 and creatinine 2.52. Troponin level was 0.06. 12 lead EKG showed underlying atrial fibrillation and left bundle branch block unchanged from previous tracings. CXR showed stable cardiomegaly, increased vascular congestion, possible pulmonary edema and increasing left middling consolidation possible edema or subcutaneous hematoma from recent pacemaker placement   Assessment & Plan    Sepsis due to L mid lung/basilar pneumonia/HCAP - Currently improving, chest x-ray showed persisting atelectasis +/- infiltrate plus probable effusion left base  - Clinically improving, pro-calcitonin improving, leukocytosis improving, no fevers - Blood cultures negative so far, urine cultures negative - Continue Levaquin,   Severe chronic nonischemic systolic CHF on chronic milrinone s/p St Jude CRT-D  03/21/15, oral and hypotension Cardiology following, continue milrinone drip, -5.2 L balance Weight finally down today 128lbs, on milrinone drip, IV Lasix 80 mg twice a day  Hypokalemia - Replaced  Hypotension  Presented with hypotension, currently resolved, likely due to #1   Acute respiratory failure with hypoxia - Currently stable, no hypoxia, O2 sats 99% on room air  Mod > severe Tricuspid regurgitation, biprostheatic aortic and mitral valve replacement  Chronic LBBB / biventricular pacemaker - Cardiology following, pacemaker interrogation done  Hypoglycemia in DM2 Reported to have CBG in the 20s, treated with IV dextrose. Possibly due to insulin, Januvia and sepsis at the time of admission with poor oral intake. - discontinue Januvia outpatient, currently no hypoglycemia.  - Discontinued Lantus, blood sugar 56 this morning  Troponin elevation Troponin currently trending down, cardiology following, patient denies any symptoms, chest pain  CKD stage 3 Baseline crt 2.4 - creatinine stable   Chronic PAF Rate controlled CHADS vasc score 6 - continue eliquis - presently well controlled  Code Status: DO NOT RESUSCITATE  Family Communication: Discussed in detail with the patient, all imaging results, lab results explained to the patient    Disposition Plan: Likely to skilled nursing facility on Monday  Time Spent in minutes   25 minutes  Procedures  None  Consults  Cardiology   DVT Prophylaxis  apixaban  Medications  Scheduled Meds: . apixaban  2.5 mg Oral BID  . docusate sodium  100 mg Oral BID  . furosemide  80 mg Intravenous BID  . insulin aspart  0-9 Units Subcutaneous TID WC  . insulin glargine  10 Units Subcutaneous QHS  . levofloxacin  500 mg Oral Q48H  . potassium chloride SA  40 mEq Oral BID  . potassium chloride  40 mEq Oral Once  . [START ON 04/09/2015] potassium chloride  40 mEq Oral Once   Continuous Infusions: . sodium chloride 10 mL/hr at  04/08/15 0845  . amiodarone 30 mg/hr (04/08/15 0826)  . milrinone 0.25 mcg/kg/min (04/07/15 1454)   PRN Meds:.sodium chloride   Antibiotics   Anti-infectives    Start     Dose/Rate Route Frequency Ordered Stop   04/08/15 0800  levofloxacin (LEVAQUIN) tablet 500 mg     500 mg Oral Every 48 hours 04/06/15 1141     04/06/15 1145  levofloxacin (LEVAQUIN) tablet 750 mg     750 mg Oral  Once 04/06/15 1138 04/06/15 1239   04/05/15 0630  vancomycin (VANCOCIN) IVPB 1000 mg/200 mL premix  Status:  Discontinued     1,000 mg 200 mL/hr over 60 Minutes Intravenous Every 48 hours 04/03/15 1030 04/06/15 1138   04/03/15 0600  vancomycin (VANCOCIN) IVPB 1000 mg/200 mL premix  Status:  Discontinued     1,000 mg 200 mL/hr over 60 Minutes Intravenous every 72 hours 03/31/15 0749 04/03/15 1030   03/31/15 1800  piperacillin-tazobactam (ZOSYN) IVPB 2.25 g  Status:  Discontinued     2.25 g 100 mL/hr over 30 Minutes Intravenous Every 8 hours 03/31/15 1020 04/06/15 1138   03/31/15 1000  piperacillin-tazobactam (ZOSYN) IVPB 3.375 g     3.375 g 100 mL/hr over 30 Minutes Intravenous  Once 03/31/15 0953 03/31/15 1043   03/31/15 0830  imipenem-cilastatin (PRIMAXIN) 250 mg in sodium chloride 0.9 % 100 mL IVPB  Status:  Discontinued     250 mg 200 mL/hr over 30 Minutes Intravenous Every 12 hours 03/31/15 0749 03/31/15 0953   03/31/15 0800  vancomycin (VANCOCIN) IVPB 1000 mg/200 mL premix     1,000 mg 200 mL/hr over 60 Minutes Intravenous  Once 03/31/15 0749 03/31/15 1113        Subjective:   Joanna [name] was seen and examined today. Denies any specific complaints, feels overall better, hoping to be discharged tomorrow. Afebrile diuresing well, negative balance of 3 L over the last 24 hours. Patient denies dizziness, abdominal pain, new weakness, numbess, tingling. No acute events overnight.    Objective:   Blood pressure 101/51, pulse 78, temperature 98.2 F (36.8 C), temperature source Oral, resp. rate  18, height 5\' 4"  (1.626 m), weight 58.2 kg (128 lb 4.9 oz), SpO2 94 %.  Wt Readings from Last 3 Encounters:  04/08/15 58.2 kg (128 lb 4.9 oz)  03/30/15 60.2 kg (132 lb 11.5 oz)  03/13/15 66.225 kg (146 lb)     Intake/Output Summary (Last 24 hours) at 04/08/15 1129 Last data filed at 04/08/15 1058  Gross per 24 hour  Intake 1547.52 ml  Output   4551 ml  Net -3003.48 ml    Exam  General: Alert and oriented x 3, NAD  HEENT:  PERRLA, EOMI  Neck: Supple, JVD + improving  CVS: S1 S2 auscultated, 2/6 SEM  Respiratory: Fairly clear to auscultation  Abdomen: Soft, NT, ND, NBS  Ext: no cyanosis clubbing or  edema  Neuro: no new deficits  Skin: No rashes  Psych: Normal affect and demeanor, alert and oriented x3    Data Review   Micro Results Recent Results (from the past 240 hour(s))  Culture, blood (routine x 2)     Status: None   Collection Time: 03/31/15  6:40 AM  Result Value Ref Range Status   Specimen Description BLOOD PICC LINE RIGHT ARM  Final   Special Requests BOTTLES DRAWN AEROBIC AND ANAEROBIC 5CC  Final   Culture   Final    NO GROWTH 5 DAYS Performed at [facility]    Report Status 04/06/2015 FINAL  Final  Culture, blood (routine x 2)     Status: None   Collection Time: 03/31/15  7:36 AM  Result Value Ref Range Status   Specimen Description BLOOD LEFT HAND  Final   Special Requests BOTTLES DRAWN AEROBIC AND ANAEROBIC 5CC  Final   Culture   Final    NO GROWTH 5 DAYS Performed at [facility]    Report Status 04/06/2015 FINAL  Final  Urine culture     Status: None   Collection Time: 03/31/15 11:52 AM  Result Value Ref Range Status   Specimen Description URINE, RANDOM  Final   Special Requests NONE  Final   Colony Count NO GROWTH Performed at [facility]   Final   Culture NO GROWTH Performed at [facility]   Final   Report Status 04/01/2015 FINAL  Final    Radiology Reports Dg Chest 2 View  03/16/2015    CLINICAL DATA:  Acute onset of chest pain and shortness of breath. Cough and congestion. Initial encounter.  EXAM: CHEST  2 VIEW  COMPARISON:  Chest radiograph performed 12/14/2014  FINDINGS: The lungs are well-aerated. A small left pleural effusion is noted. Chronically increased interstitial markings are noted. Mild peribronchial thickening is noted. No pneumothorax is identified. Scarring is seen at the left lung apex.  The heart is enlarged. The patient is status post median sternotomy. A valve replacement is noted. Orphaned pacemaker leads are visualized. No acute osseous abnormalities are seen.  IMPRESSION: Small left pleural effusion noted. Chronically increased interstitial markings seen. Cardiomegaly.   Electronically Signed   By: Roanna Raider M.D.   On: 03/16/2015 17:26   Dg Ankle Complete Right  03/23/2015   CLINICAL DATA:  Post right ankle surgery 6 weeks ago.  EXAM: RIGHT ANKLE - COMPLETE 3+ VIEW  COMPARISON:  01/30/2015  FINDINGS: Plate and screw fixation device noted across the distal fibular shaft fracture. Medial malleolar screws in place across the medial malleolar fractures. Anatomic alignment. No acute bony abnormality. Fracture lines are difficult to visualize. Fine bony detail is obscured by overlying cast material.  IMPRESSION: Evidence of healing across the distal fibular shaft fracture and medial malleolar fractures. Fine bony detail obscured by overlying cast.   Electronically Signed   By: Charlett Nose M.D.   On: 03/23/2015 15:41   Ct Head Wo Contrast  03/17/2015   CLINICAL DATA:  Acute encephalopathy.  No headache.  EXAM: CT HEAD WITHOUT CONTRAST  TECHNIQUE: Contiguous axial images were obtained from the base of the skull through the vertex without intravenous contrast.  COMPARISON:  12/15/2014  FINDINGS: The ventricles are normal configuration. There is ventricular and sulcal enlargement reflecting mild to moderate age related atrophy. No hydrocephalus.  There are no parenchymal  masses or mass effect. There is no evidence of a cortical infarct. Mild periventricular white  matter hypoattenuation is noted consistent with chronic microvascular ischemic change.  There are no extra-axial masses or abnormal fluid collections.  No intracranial hemorrhage.  Sinuses and mastoid air cells are clear.  IMPRESSION: 1. No acute intracranial abnormalities. 2. Mild to moderate atrophy. Mild chronic microvascular ischemic change.   Electronically Signed   By: Amie Portland M.D.   On: 03/17/2015 16:35   US Abdomen Complete  03/17/2015   CLINICAL DATA:  Nausea and vomiting  EXAM: ULTRASOUND ABDOMEN COMPLETE  COMPARISON:  None.  FINDINGS: Gallbladder: Gallbladder wall thickening measuring 6 mm and pericholecystic fluid noted. Adherent sludge balls or polyps, largest 3 mm. No sonographic Murphy sign.  Common bile duct: Diameter: 7 mm  Liver: No focal lesion identified. Within normal limits in parenchymal echogenicity.  IVC: No abnormality visualized.  Pancreas: Visualized portion unremarkable.  Spleen: Size and appearance within normal limits.  Right Kidney: Length: 11.2 cm, increased renal cortical echogenicity. No hydronephrosis. Cortical cysts are noted, largest 1.5 x 1.4 x 1.4 cm.  Left Kidney: Length: 10.7 cm. Suboptimally visualized due to lack of optimal acoustic window with increased cortical echogenicity but no focal abnormality or mass. No hydronephrosis.  Abdominal aorta: No aneurysm visualized.  Other findings: Ascites and bilateral pleural effusions are noted. Incidental note is made of bladder distention, volume 1094 cc.  IMPRESSION: Increased renal cortical echogenicity suggesting medical renal disease, with right renal cysts.  Bladder distention.  Gallbladder wall thickening, pericholecystic fluid, and adherent sludge balls or polyps. If the patient has right upper quadrant abdominal pain, findings would be highly suspicious for acute cholecystitis. However, there is ascites and gallbladder  wall thickening may also be secondary to liver disease or hypoproteinemia.  Pleural effusions and ascites.   Electronically Signed   By: Christiana Pellant M.D.   On: 03/17/2015 09:30   Dg Chest Port 1 View  04/03/2015   CLINICAL DATA:  Line placement.  PICC placement.  EXAM: PORTABLE CHEST - 1 VIEW  COMPARISON:  04/03/2015.  0917 hours  FINDINGS: RIGHT upper extremity PICC is present. The tip appears at the junction of the superior vena cava and RIGHT atrium.  Unchanged cardiomegaly. LEFT pleural effusion. Valvular replacement and median sternotomy. Aortic arch atherosclerosis. Mildly improved aeration at the RIGHT lung base.  IMPRESSION: RIGHT upper extremity PICC tip is at the junction of the superior vena cava and RIGHT atrium.   Electronically Signed   By: Andreas Newport M.D.   On: 04/03/2015 19:23   Dg Chest Port 1 View  04/03/2015   CLINICAL DATA:  81 year old female status post PICC line repositioning. Initial encounter.  EXAM: PORTABLE CHEST - 1 VIEW  COMPARISON:  0605 hours today and earlier.  FINDINGS: Portable AP semi upright view at 0917 hours.  On 04/02/2015 the right side PICC line tip was evident at the cavoatrial junction region. The tip now appears to terminate at the level of the third from the top median sternotomy wire, about 2 cm above the carina.  Superimposed left chest 3 lead cardiac pacemaker, epicardial leads, cardiac prosthetic bowels, cardiomegaly, calcified atherosclerosis of the aorta. Stable ventilation. No pneumothorax.  IMPRESSION: 1. Right PICC line appears pulled back, tip now at the upper SVC level. 2. Otherwise stable chest.   Electronically Signed   By: Odessa Fleming M.D.   On: 04/03/2015 09:29   Dg Chest Port 1 View  04/03/2015   CLINICAL DATA:  Pneumonia.  EXAM: PORTABLE CHEST - 1 VIEW  COMPARISON:  04/02/2015, 03/31/2015, 03/21/2015 and  12/14/2014  FINDINGS: Chronic cardiomegaly. Aortic and mitral valve prostheses. Pacemaker. PICC unchanged.  Pulmonary vascularity is now  normal. Large left pleural effusion appears slightly improved with no residual infiltrate visible on the left. Probable small right effusion.  IMPRESSION: Slightly diminished left pleural effusion. Small right effusion, unchanged. No discrete infiltrates.   Electronically Signed   By: Francene Boyers M.D.   On: 04/03/2015 07:54   Dg Chest Port 1 View  04/02/2015   CLINICAL DATA:  Pneumonia.  EXAM: PORTABLE CHEST - 1 VIEW  COMPARISON:  03/31/2015.  FINDINGS: Right PICC line in stable position. Cardiac pacer in stable position. Aortic valve replacement. Cardiomegaly with mild pulmonary venous congestion. Mild interstitial prominence is present. Left lower lobe infiltrate and/or atelectasis present. Left pleural effusion cannot be excluded. No pneumothorax. P  IMPRESSION: 1. Right PICC line in stable position. 2. Cardiac pacer in stable position. Aortic valve replacement. Cardiomegaly with persistent mild interstitial prominence suggesting mild congestive heart failure. 3. Prominent left lower lobe atelectasis and/or infiltrate. Left pleural effusion.   Electronically Signed   By: Maisie Fus  Register   On: 04/02/2015 07:24   Dg Chest Port 1 View  03/31/2015   CLINICAL DATA:  Hypotension. History of diabetes, hypertension, cardiac valve replacement.  EXAM: PORTABLE CHEST - 1 VIEW  COMPARISON:  Chest radiograph March 21, 2015  FINDINGS: The cardiac silhouette is moderately enlarged, unchanged. Calcified aortic knob. Status post mitral and aortic valve replacement and Common median sternotomy. Pulmonary vascular congestion increasing interstitial prominence. Patchy LEFT midlung zone consolidation with moderate LEFT pleural effusion. No pneumothorax.  RIGHT PICC distal tip projects in mid superior vena cava. Three lead LEFT cardiac pacemaker. The soft tissue planes included osseous structure nonsuspicious.  IMPRESSION: Stable cardiomegaly. Increasing pulmonary vascular congestion. Interstitial prominence concerning for  pulmonary edema with increasing LEFT midlung zone consolidations/confluent edema versus subcutaneous hematoma from recent pacemaker placement. Moderate LEFT pleural effusion.  No apparent change in position of RIGHT PICC line.   Electronically Signed   By: Awilda Metro M.D.   On: 03/31/2015 04:42   Dg Chest Port 1 View  03/21/2015   CLINICAL DATA:  Pacemaker insertion today. Postprocedural radiograph.  EXAM: PORTABLE CHEST - 1 VIEW  COMPARISON:  03/20/2015.  FINDINGS: Support apparatus: Monitoring leads project over the chest. New pacemaker power pack with 3 LEFT subclavian leads. Leads appear in appropriate position with coronary sinus lead. Aortic valve replacements are present. Old epicardial pacing leads are present.  The cardiopericardial silhouette remains enlarged. There is bilateral interstitial and mild alveolar airspace disease compatible with pulmonary edema/volume overload. Aortic arch atherosclerosis.  IMPRESSION:  1. Interval placement of 3 lead LEFT subclavian pacemaker and power pack. 2. Negative for pneumothorax or complicating features. 3. Mild interstitial and alveolar pulmonary edema.   Electronically Signed   By: Andreas Newport M.D.   On: 03/21/2015 17:02   Dg Chest Port 1 View  03/20/2015   CLINICAL DATA:  PICC line placement  EXAM: PORTABLE CHEST - 1 VIEW  COMPARISON:  03/17/2015  FINDINGS: Right PICC line is in place. The tip is in the SVC. There is cardiomegaly. Devitalized left pacer wires remain in place, unchanged. Prior valve replacement. Left lower lobe opacity with left effusion again noted, unchanged.  IMPRESSION: Right PICC line tip in the SVC.  No change in the left lower lobe opacity and left effusion.   Electronically Signed   By: Charlett Nose M.D.   On: 03/20/2015 11:14   Dg Chest Port 1  View  03/17/2015   CLINICAL DATA:  Central line placement.  EXAM: PORTABLE CHEST - 1 VIEW  COMPARISON:  03/16/2015  FINDINGS: Patient is moderately rotated to the left. Sternotomy  wires and prosthetic heart valve unchanged. Evidence of a right IJ central venous catheter with tip likely over the SVC accounting for patient's rotation. No evidence of pneumothorax. There is continued left base opacification unchanged to slightly worse likely effusions/ atelectasis and less likely infection. Mild prominence of the perihilar markings suggesting mild vascular congestion. Remainder of the exam is unchanged.  IMPRESSION: Slight worsening left base opacification likely effusion with atelectasis although cannot exclude infection.  Suggestion mild vascular congestion.  Right IJ central venous catheter with tip likely over the region of the SVC. No pneumothorax.   Electronically Signed   By: Elberta Fortis M.D.   On: 03/17/2015 12:51    CBC  Recent Labs Lab 04/02/15 0004 04/03/15 0420  WBC 13.4* 12.5*  HGB 10.2* 10.3*  HCT 32.1* 32.4*  PLT 193 189  MCV 93.9 94.2  MCH 29.8 29.9  MCHC 31.8 31.8  RDW 16.1* 16.0*    Chemistries   Recent Labs Lab 04/02/15 1003 04/03/15 0420 04/04/15 0239 04/06/15 1050 04/07/15 1027 04/08/15 0421  NA  --  137 136 137 135 139  K  --  4.1 4.8 3.5 3.4* 2.8*  CL  --  100* 102 101 97* 97*  CO2  --  28 23 26 25  33*  GLUCOSE  --  151* 174* 198* 138* 91  BUN  --  51* 50* 49* 48* 45*  CREATININE  --  2.14* 2.13* 1.90* 1.98* 1.87*  CALCIUM  --  9.3 9.5 9.2 9.1 9.6  MG 2.0  --   --   --   --   --    ------------------------------------------------------------------------------------------------------------------ estimated creatinine clearance is 20 mL/min (by C-G formula based on Cr of 1.87). ------------------------------------------------------------------------------------------------------------------ No results for input(s): HGBA1C in the last 72 hours. ------------------------------------------------------------------------------------------------------------------ No results for input(s): CHOL, HDL, LDLCALC, TRIG, CHOLHDL, LDLDIRECT in the  last 72 hours. ------------------------------------------------------------------------------------------------------------------ No results for input(s): TSH, T4TOTAL, T3FREE, THYROIDAB in the last 72 hours.  Invalid input(s): FREET3 ------------------------------------------------------------------------------------------------------------------ No results for input(s): VITAMINB12, FOLATE, FERRITIN, TIBC, IRON, RETICCTPCT in the last 72 hours.  Coagulation profile No results for input(s): INR, PROTIME in the last 168 hours.  No results for input(s): DDIMER in the last 72 hours.  Cardiac Enzymes  Recent Labs Lab 04/02/15 0520 04/02/15 1330 04/03/15 0420  TROPONINI 0.15* 0.13* 0.11*   ------------------------------------------------------------------------------------------------------------------ Invalid input(s): POCBNP   Recent Labs  04/07/15 1111 04/07/15 1744 04/07/15 2133 04/08/15 0703 04/08/15 0805 04/08/15 1123  GLUCAP 128* 205* 176* 56* 118* 111*     RAI,RIPUDEEP M.D. [facility] 04/08/2015, 11:29 AM  Pager: [phone]   Between 7am to 7pm - call Pager - [phone]  After 7pm go to [email or web address] - password [email or web address]  Call night coverage person covering after 7pm

## 2015-04-08 NOTE — Progress Notes (Signed)
Paged by nursing staff for 10 beats of NSVT at 8:17am, her K this morning for 2.8 this morning. She is receiving aggressive diuresis.   Per nurse, she keep having IV infiltration problem. IV KCl not good option given lack of IV lines. Will given additional KCl timed at 4pm today and tomorrow on top of the BID KCl she's already taking.   Ramond Dial PA Pager: [phone]

## 2015-04-08 NOTE — Progress Notes (Signed)
Advanced Heart Failure Rounding Note   Subjective:     Says she feels great. Continues to diurese well. Weight down 16 pounds in 3 days. Renal function improving. K low. Being supped orally due to problems with IV access.    Objective:   Weight Range:  Vital Signs:   Temp:  [97.8 F (36.6 C)-98.7 F (37.1 C)] 98.2 F (36.8 C) (06/19 0458) Pulse Rate:  [78-83] 78 (06/19 0820) Resp:  [18] 18 (06/19 0458) BP: (101-129)/(50-74) 101/51 mmHg (06/19 0820) SpO2:  [93 %-98 %] 94 % (06/19 0458) Weight:  [58.2 kg (128 lb 4.9 oz)] 58.2 kg (128 lb 4.9 oz) (06/19 0458) Last BM Date: 04/06/15  Weight change: Filed Weights   04/06/15 1100 04/07/15 0653 04/08/15 0458  Weight: 62.551 kg (137 lb 14.4 oz) 59.875 kg (132 lb) 58.2 kg (128 lb 4.9 oz)    Intake/Output:   Intake/Output Summary (Last 24 hours) at 04/08/15 1210 Last data filed at 04/08/15 1058  Gross per 24 hour  Intake 1547.52 ml  Output   3951 ml  Net -2403.48 ml     Physical Exam: General: Elderly woman.sitting in chiar  NAD Neck: JVP 7-8  prominent CV waves  no thyromegaly or thyroid nodule.  Lungs: clear but decreased at bases CV: Nondisplaced PMI. Heart regular S1/S2, 2/6 SEM RUSB. trace edema.  Healing sore on L shin Abdomen: Soft, nontender, no hepatosplenomegaly, no distention.  Neurologic: Alert and oriented x 3.  Psych: tearful at times Extremities: No clubbing or cyanosis.   TELEMETRY: Reviewed telemetry pt in NSR with v-pacing and occasional PVCs.  Labs: Basic Metabolic Panel:  Recent Labs Lab 04/02/15 1003 04/03/15 0420 04/04/15 0239 04/06/15 1050 04/07/15 1027 04/08/15 0421  NA  --  137 136 137 135 139  K  --  4.1 4.8 3.5 3.4* 2.8*  CL  --  100* 102 101 97* 97*  CO2  --  33*  GLUCOSE  --  151* 174* 198* 138* 91  BUN  --  51* 50* 49* 48* 45*  CREATININE  --  2.14* 2.13* 1.90* 1.98* 1.87*  CALCIUM  --  9.3 9.5 9.2 9.1 9.6  MG 2.0  --   --   --   --   --     Liver Function  Tests: No results for input(s): AST, ALT, ALKPHOS, BILITOT, PROT, ALBUMIN in the last 168 hours. No results for input(s): LIPASE, AMYLASE in the last 168 hours. No results for input(s): AMMONIA in the last 168 hours.  CBC:  Recent Labs Lab 04/02/15 0004 04/03/15 0420  WBC 13.4* 12.5*  HGB 10.2* 10.3*  HCT 32.1* 32.4*  MCV 93.9 94.2  PLT 193 189    Cardiac Enzymes:  Recent Labs Lab 04/02/15 0520 04/02/15 1330 04/03/15 0420  TROPONINI 0.15* 0.13* 0.11*    BNP: BNP (last 3 results)  Recent Labs  03/13/15 1028 03/16/15 1619 03/31/15 0430  BNP 4332.8* >4500.0* 3300.6*    ProBNP (last 3 results) No results for input(s): PROBNP in the last 8760 hours.    Other results:  Imaging: No results found.   Medications:     Scheduled Medications: . apixaban  2.5 mg Oral BID  . docusate sodium  100 mg Oral BID  . furosemide  80 mg Intravenous BID  . insulin aspart  0-9 Units Subcutaneous TID WC  . levofloxacin  500 mg Oral Q48H  . potassium chloride SA  40 mEq Oral BID  . potassium  chloride  40 mEq Oral Once  . [START ON 04/09/2015] potassium chloride  40 mEq Oral Once    Infusions: . sodium chloride 10 mL/hr at 04/08/15 0845  . amiodarone 30 mg/hr (04/08/15 0826)  . milrinone 0.25 mcg/kg/min (04/07/15 1454)    PRN Medications: sodium chloride   Assessment:   1. Hypoglycemic event 2. Possible aspiration PNA 3. Acute on chronic systolic HF requiring home milrinone. EF 25%. S/p CRT-D upgrade 5/16 4. PAF 5. Bioprosthetic MV/AoV 6. Depression  7. CKD, stage IV 8. DNR/DNI 9. Hypokalemia  Plan/Discussion:    She feels good. Volume status much improved. Change lasix to po. Supp K+. Continue milrinone. Hopefully back to [name] tomorrow. .    Length of Stay: 8 Arvilla Meres MD 04/08/2015, 12:10 PM  Advanced Heart Failure Team Pager [phone] (M-F; 7a - 4p)  Please contact [facility] for night-coverage after hours (4p -7a ) and  weekends on [email or web address]

## 2015-04-08 NOTE — Progress Notes (Signed)
ANTIBIOTIC CONSULT NOTE   Pharmacy Consult for Levaquin Indication: Sepsis/PNA  Allergies  Allergen Reactions  . Other Hives    STEROIDS  . Prednisone Hives and Other (See Comments)    She is allergic to all steroids!   Assessment: 81 year old female continues on Levaquin for sepsis/pneumoia.   Afebrile, WBC stable Cultures negative Scr improving. CrCl ~ 20 mL/min  PCT negative  Vanc 6/11>>6/17 Zosyn 6/11>>6/17 Levofloxacin 6/17 >  BCx 6/11>>NG  6/11 urine - ng  Goal of Therapy:  Resolution of infection   Plan:  Levaquin 500 mg IV Q 48 hours Monitor CBC, renal fx, and clinical progress    Labs:  Recent Labs  04/06/15 1050 04/07/15 1027 04/08/15 0421  CREATININE 1.90* 1.98* 1.87*   Estimated Creatinine Clearance: 20 mL/min (by C-G formula based on Cr of 1.87). No results for input(s): VANCOTROUGH, VANCOPEAK, VANCORANDOM, GENTTROUGH, GENTPEAK, GENTRANDOM, TOBRATROUGH, TOBRAPEAK, TOBRARND, AMIKACINPEAK, AMIKACINTROU, AMIKACIN in the last 72 hours.   Microbiology: Recent Results (from the past 720 hour(s))  Culture, blood (routine x 2)     Status: None   Collection Time: 03/16/15  4:18 PM  Result Value Ref Range Status   Specimen Description BLOOD LEFT WRIST  Final   Special Requests BOTTLES DRAWN AEROBIC ONLY 3CC  Final   Culture   Final    NO GROWTH 5 DAYS Performed at [facility]    Report Status 03/23/2015 FINAL  Final  Culture, blood (routine x 2)     Status: None   Collection Time: 03/16/15  4:21 PM  Result Value Ref Range Status   Specimen Description BLOOD RIGHT ARM  Final   Special Requests BOTTLES DRAWN AEROBIC AND ANAEROBIC 3CC  Final   Culture   Final    NO GROWTH 5 DAYS Performed at [facility]    Report Status 03/23/2015 FINAL  Final  Urine culture     Status: None   Collection Time: 03/16/15  5:44 PM  Result Value Ref Range Status   Specimen Description URINE, CLEAN CATCH  Final   Special Requests NONE  Final    Colony Count   Final    >=100,000 COLONIES/ML Performed at [facility]    Culture   Final    ESCHERICHIA COLI Performed at [facility]    Report Status 03/19/2015 FINAL  Final   Organism ID, Bacteria ESCHERICHIA COLI  Final      Susceptibility   Escherichia coli - MIC*    AMPICILLIN >=32 RESISTANT Resistant     CEFAZOLIN <=4 SENSITIVE Sensitive     CEFTRIAXONE <=1 SENSITIVE Sensitive     CIPROFLOXACIN <=0.25 SENSITIVE Sensitive     GENTAMICIN <=1 SENSITIVE Sensitive     LEVOFLOXACIN <=0.12 SENSITIVE Sensitive     NITROFURANTOIN <=16 SENSITIVE Sensitive     TOBRAMYCIN <=1 SENSITIVE Sensitive     TRIMETH/SULFA <=20 SENSITIVE Sensitive     PIP/TAZO <=4 SENSITIVE Sensitive     * ESCHERICHIA COLI  MRSA PCR Screening     Status: None   Collection Time: 03/16/15  7:57 PM  Result Value Ref Range Status   MRSA by PCR NEGATIVE NEGATIVE Final    Comment:        The GeneXpert MRSA Assay (FDA approved for NASAL specimens only), is one component of a comprehensive MRSA colonization surveillance program. It is not intended to diagnose MRSA infection nor to guide or monitor treatment for MRSA infections.   Culture, blood (routine x 2)  Status: None   Collection Time: 03/31/15  6:40 AM  Result Value Ref Range Status   Specimen Description BLOOD PICC LINE RIGHT ARM  Final   Special Requests BOTTLES DRAWN AEROBIC AND ANAEROBIC 5CC  Final   Culture   Final    NO GROWTH 5 DAYS Performed at [facility]    Report Status 04/06/2015 FINAL  Final  Culture, blood (routine x 2)     Status: None   Collection Time: 03/31/15  7:36 AM  Result Value Ref Range Status   Specimen Description BLOOD LEFT HAND  Final   Special Requests BOTTLES DRAWN AEROBIC AND ANAEROBIC 5CC  Final   Culture   Final    NO GROWTH 5 DAYS Performed at [facility]    Report Status 04/06/2015 FINAL  Final  Urine culture     Status: None   Collection Time: 03/31/15 11:52 AM   Result Value Ref Range Status   Specimen Description URINE, RANDOM  Final   Special Requests NONE  Final   Colony Count NO GROWTH Performed at [facility]   Final   Culture NO GROWTH Performed at [facility]   Final   Report Status 04/01/2015 FINAL  Final     Vinnie Level, PharmD., BCPS Clinical Pharmacist Pager [phone]

## 2015-04-09 ENCOUNTER — Telehealth: Payer: Self-pay | Admitting: *Deleted

## 2015-04-09 LAB — BASIC METABOLIC PANEL
Anion gap: 9 (ref 5–15)
BUN: 42 mg/dL — ABNORMAL HIGH (ref 6–20)
CO2: 32 mmol/L (ref 22–32)
CREATININE: 2.01 mg/dL — AB (ref 0.44–1.00)
Calcium: 8.9 mg/dL (ref 8.9–10.3)
Chloride: 95 mmol/L — ABNORMAL LOW (ref 101–111)
GFR, EST AFRICAN AMERICAN: 25 mL/min — AB (ref >60)
GFR, EST NON AFRICAN AMERICAN: 22 mL/min — AB (ref >60)
Glucose, Bld: 188 mg/dL — ABNORMAL HIGH (ref 65–99)
Potassium: 3.4 mmol/L — ABNORMAL LOW (ref 3.5–5.1)
Sodium: 136 mmol/L (ref 135–145)

## 2015-04-09 LAB — GLUCOSE, CAPILLARY
GLUCOSE-CAPILLARY: 181 mg/dL — AB (ref 65–99)
Glucose-Capillary: 175 mg/dL — ABNORMAL HIGH (ref 65–99)

## 2015-04-09 MED ORDER — HYDROCODONE-ACETAMINOPHEN 5-325 MG PO TABS: 1.0000 | ORAL_TABLET | Freq: Three times a day (TID) | ORAL | Status: DC | PRN

## 2015-04-09 MED ORDER — POTASSIUM CHLORIDE CRYS ER 20 MEQ PO TBCR: 40.0000 meq | EXTENDED_RELEASE_TABLET | Freq: Two times a day (BID) | ORAL | Status: DC

## 2015-04-09 MED ORDER — INSULIN ASPART 100 UNIT/ML ~~LOC~~ SOLN: 0.0000 [IU] | Freq: Three times a day (TID) | SUBCUTANEOUS | Status: DC

## 2015-04-09 MED ORDER — LEVOFLOXACIN 500 MG PO TABS: 500.0000 mg | ORAL_TABLET | ORAL | Status: DC

## 2015-04-09 MED ORDER — AMIODARONE HCL 200 MG PO TABS
200.0000 mg | ORAL_TABLET | Freq: Two times a day (BID) | ORAL | Status: DC
  Administered 2015-04-09: 200 mg via ORAL
  Filled 2015-04-09 (×2): qty 1

## 2015-04-09 MED ORDER — POTASSIUM CHLORIDE CRYS ER 20 MEQ PO TBCR
20.0000 meq | EXTENDED_RELEASE_TABLET | Freq: Once | ORAL | Status: AC
  Administered 2015-04-09: 20 meq via ORAL

## 2015-04-09 MED ORDER — FUROSEMIDE 40 MG PO TABS: 60.0000 mg | ORAL_TABLET | Freq: Every day | ORAL | Status: DC

## 2015-04-09 NOTE — Progress Notes (Signed)
DC IV, DC Tele, DC to SNF. Discharge instructions and home medications prepared and handled by Child psychotherapist. Patient leaving unit via EMS and appears in no acute distress.

## 2015-04-09 NOTE — Progress Notes (Signed)
Physical Therapy Treatment Patient Details Name: Joanna [name] MRN: [id number] DOB: 07/22/1930 Today's Date: 04/09/2015    History of Present Illness Joanna [name] is a 81 y.o. female has a past medical history significant for chronic systolic and diastolic heart failure, ejection fraction 20-25%, prior AVR/MVR 8/14, type 2 diabetes mellitus, hypertension, hyperlipidemia, fall with right ankle fx s/p ORIF 4/13, last admission cardiogenic shock with pacemaker replacement 6/1. Cast removed 6/3 with pt now WBAT in CAM boot. Pt readmitted from [facility] with sepsis, HCAP    PT Comments    Pt continues to be very pleasant, willing to work, eager to walk and progress for eventual return home with dgtr. Pt with assist to don CAM boot and supervision for safety for all mobility. Pt with BM and assist for complete pericare with toileting. Will continue to follow.   Follow Up Recommendations  SNF     Equipment Recommendations       Recommendations for Other Services       Precautions / Restrictions Precautions Precautions: Fall Other Brace/Splint: CAM boot RLE with all OOB activity Restrictions RLE Weight Bearing: Weight bearing as tolerated    Mobility  Bed Mobility               General bed mobility comments: pt EOB on arrival  Transfers Overall transfer level: Needs assistance   Transfers: Sit to/from Stand Sit to Stand: Supervision         General transfer comment: Verbal cues for hand placement  Ambulation/Gait Ambulation/Gait assistance: Supervision Ambulation Distance (Feet): 350 Feet Assistive device: Rolling walker (2 wheeled) Gait Pattern/deviations: Step-through pattern;Decreased stride length   Gait velocity interpretation: Below normal speed for age/gender General Gait Details: cues for posture and position in RW, good control of RW today. Recommend shoe for LLE to assist with balance with gait   Stairs            Wheelchair Mobility     Modified Rankin (Stroke Patients Only)       Balance Overall balance assessment: Needs assistance   Sitting balance-Leahy Scale: Good       Standing balance-Leahy Scale: Fair                      Cognition Arousal/Alertness: Awake/alert Behavior During Therapy: WFL for tasks assessed/performed Overall Cognitive Status: Within Functional Limits for tasks assessed       Memory: Decreased short-term memory              Exercises General Exercises - Lower Extremity Long Arc Quad: AROM;Seated;Both;20 reps Hip Flexion/Marching: AROM;Seated;Both;20 reps    General Comments        Pertinent Vitals/Pain Pain Assessment: No/denies pain    Home Living                      Prior Function            PT Goals (current goals can now be found in the care plan section) Progress towards PT goals: Progressing toward goals    Frequency       PT Plan Current plan remains appropriate    Co-evaluation             End of Session   Activity Tolerance: Patient tolerated treatment well Patient left: in chair;with call bell/phone within reach;with chair alarm set     Time: 8338-2505 PT Time Calculation (min) (ACUTE ONLY): 23 min  Charges:  $Gait Training: 8-22 mins $Therapeutic  Exercise: 8-22 mins                    G Codes:      Delorse Lek 05/05/15, 12:49 PM Delaney Meigs, PT [phone]

## 2015-04-09 NOTE — Progress Notes (Signed)
Advanced Heart Failure Rounding Note   Subjective:     Says she feels great. Weight stable on po diuretics.  K improving.    Objective:   Weight Range:  Vital Signs:   Temp:  [97.8 F (36.6 C)-98.1 F (36.7 C)] 98.1 F (36.7 C) (06/20 0457) Pulse Rate:  [77-84] 80 (06/20 1136) Resp:  [18-20] 18 (06/20 0457) BP: (98-137)/(52-65) 137/54 mmHg (06/20 1136) SpO2:  [90 %-94 %] 90 % (06/20 0457) Weight:  [128 lb 9.6 oz (58.333 kg)] 128 lb 9.6 oz (58.333 kg) (06/20 0457) Last BM Date: 04/08/15  Weight change: Filed Weights   04/07/15 0653 04/08/15 0458 04/09/15 0457  Weight: 132 lb (59.875 kg) 128 lb 4.9 oz (58.2 kg) 128 lb 9.6 oz (58.333 kg)    Intake/Output:   Intake/Output Summary (Last 24 hours) at 04/09/15 1159 Last data filed at 04/09/15 1022  Gross per 24 hour  Intake 1381.61 ml  Output   1928 ml  Net -546.39 ml     Physical Exam: General: Elderly woman.sitting in chiar  NAD Neck: JVP 8-9  prominent CV waves  no thyromegaly or thyroid nodule.  Lungs: clear but decreased at bases CV: Nondisplaced PMI. Heart regular S1/S2, 2/6 SEM RUSB. trace edema.  Healing sore on L shin Abdomen: Soft, nontender, no hepatosplenomegaly, no distention.  Neurologic: Alert and oriented x 3.  Psych: tearful at times Extremities: No clubbing or cyanosis.   TELEMETRY: Reviewed telemetry pt in NSR with v-pacing and occasional PVCs.  Labs: Basic Metabolic Panel:  Recent Labs Lab 04/04/15 0239 04/06/15 1050 04/07/15 1027 04/08/15 0421 04/09/15 0449  NA 136 137 135 139 136  K 4.8 3.5 3.4* 2.8* 3.4*  CL 102 101 97* 97* 95*  CO2 23 26 25  33* 32  GLUCOSE 174* 198* 138* 91 188*  BUN 50* 49* 48* 45* 42*  CREATININE 2.13* 1.90* 1.98* 1.87* 2.01*  CALCIUM 9.5 9.2 9.1 9.6 8.9    Liver Function Tests: No results for input(s): AST, ALT, ALKPHOS, BILITOT, PROT, ALBUMIN in the last 168 hours. No results for input(s): LIPASE, AMYLASE in the last 168 hours. No results for  input(s): AMMONIA in the last 168 hours.  CBC:  Recent Labs Lab 04/03/15 0420  WBC 12.5*  HGB 10.3*  HCT 32.4*  MCV 94.2  PLT 189    Cardiac Enzymes:  Recent Labs Lab 04/02/15 1330 04/03/15 0420  TROPONINI 0.13* 0.11*    BNP: BNP (last 3 results)  Recent Labs  03/13/15 1028 03/16/15 1619 03/31/15 0430  BNP 4332.8* >4500.0* 3300.6*    ProBNP (last 3 results) No results for input(s): PROBNP in the last 8760 hours.    Other results:  Imaging: No results found.   Medications:     Scheduled Medications: . apixaban  2.5 mg Oral BID  . docusate sodium  100 mg Oral BID  . furosemide  60 mg Oral Daily  . insulin aspart  0-9 Units Subcutaneous TID WC  . levofloxacin  500 mg Oral Q48H  . potassium chloride SA  40 mEq Oral BID    Infusions: . sodium chloride 10 mL/hr at 04/08/15 0845  . amiodarone 30 mg/hr (04/09/15 0938)  . milrinone 0.25 mcg/kg/min (04/09/15 1138)    PRN Medications: sodium chloride   Assessment:   1. Hypoglycemic event 2. Possible aspiration PNA 3. Acute on chronic systolic HF requiring home milrinone. EF 25%. S/p CRT-D upgrade 5/16 4. PAF 5. Bioprosthetic MV/AoV 6. Depression  7. CKD, stage IV 8. DNR/DNI  9. Hypokalemia  Plan/Discussion:    Stable on po diuretics. Can go to [name] today. Will need close f/u in HF Clinic.    Length of Stay: 9 Bensimhon, Daniel MD 04/09/2015, 11:59 AM  Advanced Heart Failure Team Pager [phone] (M-F; 7a - 4p)  Please contact [facility] for night-coverage after hours (4p -7a ) and weekends on [email or web address]

## 2015-04-09 NOTE — Discharge Summary (Signed)
Physician Discharge Summary   Patient ID: Joanna [name] MRN: [id number] DOB/AGE: 1930-09-22 81 y.o.  Admit date: 03/31/2015 Discharge date: 04/09/2015  Primary Care Physician:  Sanda Linger, MD  Discharge Diagnoses:   . Acute on chronic combined systolic and diastolic congestive heart failure, NYHA class 4 . Acute respiratory failure with hypoxia . HCAP (healthcare-associated pneumonia) . Pleural effusion, left .  Diabetes mellitus with hypoglycemia . Essential hypertension . Pulmonary HTN . PAF (paroxysmal atrial fibrillation) . CKD (chronic kidney disease) stage 3, GFR 30-59 ml/min . Hyperlipidemia with target LDL less than 100 . Leukocytosis . Sepsis associated hypotension .  Iron deficiency anemia . Malnutrition of moderate degree . CKD (chronic kidney disease) stage 4, GFR 15-29 ml/min . Anemia of chronic disease . Hypokalemia   Consults:   Cardiology, Dr. Gala Romney   Recommendations for Outpatient Follow-up:  Follow CBC, BMET at the time of appointment  Please note Januvia, Lantus has been discontinued, patient was placed on sliding-scale insulin    DIET: Carb modified diet    Allergies:   Allergies  Allergen Reactions  . Other Hives    STEROIDS  . Prednisone Hives and Other (See Comments)    She is allergic to all steroids!     Discharge Medications:   Medication List    STOP taking these medications        insulin aspart 100 UNIT/ML FlexPen  Commonly known as:  NOVOLOG  Replaced by:  insulin aspart 100 UNIT/ML injection     Insulin Glargine 300 UNIT/ML Sopn  Commonly known as:  TOUJEO SOLOSTAR     JANUVIA 50 MG tablet  Generic drug:  sitaGLIPtin      TAKE these medications        acetaminophen 325 MG tablet  Commonly known as:  TYLENOL  Take 325 mg by mouth every 6 (six) hours as needed for mild pain.     amiodarone 200 MG tablet  Commonly known as:  PACERONE  Take 1 tablet (200 mg total) by mouth 2 (two) times daily.      apixaban 2.5 MG Tabs tablet  Commonly known as:  ELIQUIS  Take 1 tablet (2.5 mg total) by mouth 2 (two) times daily.     calcium citrate-vitamin D 315-200 MG-UNIT per tablet  Commonly known as:  CITRACAL+D  Take 1 tablet by mouth daily.     docusate sodium 100 MG capsule  Commonly known as:  COLACE  Take 100 mg by mouth 2 (two) times daily.     ferrous sulfate 325 (65 FE) MG tablet  Take 1 tablet (325 mg total) by mouth 2 (two) times daily with a meal.     furosemide 40 MG tablet  Commonly known as:  LASIX  Take 1.5 tablets (60 mg total) by mouth daily.     hydrALAZINE 10 MG tablet  Commonly known as:  APRESOLINE  Take 1 tablet (10 mg total) by mouth every 8 (eight) hours.     HYDROcodone-acetaminophen 5-325 MG per tablet  Commonly known as:  NORCO/VICODIN  Take 1 tablet by mouth every 8 (eight) hours as needed for severe pain.     insulin aspart 100 UNIT/ML injection  Commonly known as:  novoLOG  Inject 0-9 Units into the skin 3 (three) times daily with meals. Sliding scale CBG 70 - 120: 0 units CBG 121 - 150: 1 unit,  CBG 151 - 200: 2 units,  CBG 201 - 250: 3 units,  CBG 251 - 300: 5 units,  CBG 301 - 350: 7 units,  CBG 351 - 400: 9 units   CBG > 400: 9 units and notify your MD     Insulin Pen Needle 32G X 6 MM Misc  Commonly known as:  NOVOFINE  Use daily with insulin     isosorbide mononitrate 30 MG 24 hr tablet  Commonly known as:  IMDUR  Take 0.5 tablets (15 mg total) by mouth daily.     levofloxacin 500 MG tablet  Commonly known as:  LEVAQUIN  Take 1 tablet (500 mg total) by mouth every other day. X 3 more doses     metolazone 5 MG tablet  Commonly known as:  ZAROXOLYN  Take 1 tablet (5 mg total) by mouth See admin instructions. Take as needed for 3 lb weight gain over discharge weight of 132 lbs. Give prior to Lasix.     milrinone 20 MG/100ML Soln infusion  Commonly known as:  PRIMACOR  Inject 15.475 mcg/min into the vein continuous.     ondansetron 4 MG  tablet  Commonly known as:  ZOFRAN  Take 4 mg by mouth every 4 (four) hours as needed for nausea or vomiting.     potassium chloride SA 20 MEQ tablet  Commonly known as:  K-DUR,KLOR-CON  Take 2 tablets (40 mEq total) by mouth 2 (two) times daily.     promethazine 25 MG tablet  Commonly known as:  PHENERGAN  Take 25 mg by mouth every 12 (twelve) hours as needed for nausea or vomiting.         Brief H and P: For complete details please refer to admission H and P, but in brief84yo F with chronic systolic congestive heart failure on chronic milrinone tx (TTE April 2016 EF 20-25%), atrial fibrillation, AVR/MVR 06/15/2013, hypertension, DM2, dyslipidemia, hospitalization for decompensated CHF from 03/16/2015 through 03/30/2015 during which time she had pacemaker placement who presented to [facility] ER from her SNF with CBGs in 20s. She had reports of lightheadedness, nausea but no vomiting. When she was given glucagon and dextrose her symptoms improved. In ED blood pressure was 83/49, heart rate 55, respiratory rate 12-21, afebrile and oxygen saturation 94% with nasal cannula oxygen support. Blood work was notable for white blood cell count of 24.6, hemoglobin 11.4, potassium 3.2 and creatinine 2.52. Troponin level was 0.06. 12 lead EKG showed underlying atrial fibrillation and left bundle branch block unchanged from previous tracings. CXR showed stable cardiomegaly, increased vascular congestion, possible pulmonary edema and increasing left middling consolidation possible edema or subcutaneous hematoma from recent pacemaker placement  Hospital Course:  Sepsis due to L mid lung/basilar pneumonia/HCAP chest x-ray showed persisting atelectasis +/- infiltrate plus probable effusion left base. Patient has clinically improved.  - Clinically improving, pro-calcitonin improving, leukocytosis improved, no fevers. Blood cultures have remained negative so far, urine cultures negative. Patient was  transitioned to oral antibiotics, continue Levaquin for 3 more doses to complete the full course. Please obtain chest x-ray in 2-3 weeks to ensure complete resolution of pneumonia.   Severe chronic nonischemic systolic CHF on chronic milrinone s/p St Jude CRT-D 03/21/15, oral and hypotension Patient was followed closely by the cardiology/CHF team. She was placed on milrinone drip continuously. Patient has diuresed well with this negative balance of 5.2 L. Weight down to 128lbs. continue milrinone drip outpatient, Lasix 60 mg daily. Zaroxolyn with instructions as needed.   Hypokalemia - Replaced, continue replacement  Hypotension  Presented with hypotension, currently resolved, likely due to #1  Acute respiratory failure with hypoxia - Currently stable, no hypoxia, O2 sats 99% on room air  Mod > severe Tricuspid regurgitation, biprostheatic aortic and mitral valve replacement  Chronic LBBB / biventricular pacemaker - Cardiology following, pacemaker interrogation done  Hypoglycemia in DM2 Reported to have CBG in the 20s, treated with IV dextrose. Possibly due to insulin, Januvia and sepsis at the time of admission with poor oral intake. Discontinued Januvia and Lantus outpatient, currently no hypoglycemia. Continue sliding scale insulin only  Troponin elevation Likely due to sepsis, and acute infection and severe acute on chronic CHF. Patient denied any symptoms or chest pain, cardiology followed the patient closely.   CKD stage 3 Baseline crt 2.4 - creatinine stable   Chronic PAF Rate controlled CHADS vasc score 6 - continue eliquis - presently well controlled    Day of Discharge BP 114/65 mmHg  Pulse 80  Temp(Src) 98.1 F (36.7 C) (Oral)  Resp 18  Ht 5\' 4"  (1.626 m)  Wt 58.333 kg (128 lb 9.6 oz)  BMI 22.06 kg/m2  SpO2 90%  Physical Exam: General: Alert and awake oriented x3 not in any acute distress. HEENT: anicteric sclera, pupils reactive to light and  accommodation CVS: S1-S2 clear no murmur rubs or gallops Chest: clear to auscultation bilaterally, no wheezing rales or rhonchi Abdomen: soft nontender, nondistended, normal bowel sounds Extremities: no cyanosis, clubbing or edema noted bilaterally Neuro: Cranial nerves II-XII intact, no focal neurological deficits   The results of significant diagnostics from this hospitalization (including imaging, microbiology, ancillary and laboratory) are listed below for reference.    LAB RESULTS: Basic Metabolic Panel:  Recent Labs Lab 04/08/15 0421 04/09/15 0449  NA 139 136  K 2.8* 3.4*  CL 97* 95*  CO2 33* 32  GLUCOSE 91 188*  BUN 45* 42*  CREATININE 1.87* 2.01*  CALCIUM 9.6 8.9   Liver Function Tests: No results for input(s): AST, ALT, ALKPHOS, BILITOT, PROT, ALBUMIN in the last 168 hours. No results for input(s): LIPASE, AMYLASE in the last 168 hours. No results for input(s): AMMONIA in the last 168 hours. CBC:  Recent Labs Lab 04/03/15 0420  WBC 12.5*  HGB 10.3*  HCT 32.4*  MCV 94.2  PLT 189   Cardiac Enzymes:  Recent Labs Lab 04/02/15 1330 04/03/15 0420  TROPONINI 0.13* 0.11*   BNP: Invalid input(s): POCBNP CBG:  Recent Labs Lab 04/08/15 1608 04/09/15 0614  GLUCAP 191* 181*    Significant Diagnostic Studies:  Dg Chest Port 1 View  03/31/2015   CLINICAL DATA:  Hypotension. History of diabetes, hypertension, cardiac valve replacement.  EXAM: PORTABLE CHEST - 1 VIEW  COMPARISON:  Chest radiograph March 21, 2015  FINDINGS: The cardiac silhouette is moderately enlarged, unchanged. Calcified aortic knob. Status post mitral and aortic valve replacement and Common median sternotomy. Pulmonary vascular congestion increasing interstitial prominence. Patchy LEFT midlung zone consolidation with moderate LEFT pleural effusion. No pneumothorax.  RIGHT PICC distal tip projects in mid superior vena cava. Three lead LEFT cardiac pacemaker. The soft tissue planes included  osseous structure nonsuspicious.  IMPRESSION: Stable cardiomegaly. Increasing pulmonary vascular congestion. Interstitial prominence concerning for pulmonary edema with increasing LEFT midlung zone consolidations/confluent edema versus subcutaneous hematoma from recent pacemaker placement. Moderate LEFT pleural effusion.  No apparent change in position of RIGHT PICC line.   Electronically Signed   By: Awilda Metro M.D.   On: 03/31/2015 04:42    2D ECHO:   Disposition and Follow-up: Discharge Instructions    (HEART FAILURE PATIENTS)  Call MD:  Anytime you have any of the following symptoms: 1) 3 pound weight gain in 24 hours or 5 pounds in 1 week 2) shortness of breath, with or without a dry hacking cough 3) swelling in the hands, feet or stomach 4) if you have to sleep on extra pillows at night in order to breathe.    Complete by:  As directed      Diet Carb Modified    Complete by:  As directed      Increase activity slowly    Complete by:  As directed             DISPOSITION: SNF   DISCHARGE FOLLOW-UP     Follow-up Information    Follow up with Arvilla Meres, MD. Go on 04/10/2015.   Specialty:  Cardiology   Why:  at 0940am in the [facility]--gate code 0800--please bring all medications to appt   Contact information:   [address] [place] [address] [phone]       Follow up with Sanda Linger, MD. Schedule an appointment as soon as possible for a visit in 10 days.   Specialty:  Internal Medicine   Why:  for hospital follow-up   Contact information:   [address] [place] [address] [phone]        Time spent on Discharge: 40 mins   Signed:   Cynthea Zachman M.D. [facility] 04/09/2015, 10:28 AM Pager: [phone]

## 2015-04-09 NOTE — Progress Notes (Signed)
Patient had a 6 beat run of V-Tach. Patient was asymptomatic at the time this occurred. PA and attending MD made aware. New orders given. Will continue to monitor patient for further changes in condition.

## 2015-04-09 NOTE — Telephone Encounter (Signed)
Transition Care Management Follow-up Telephone Call D/C 04/09/15  How have you been since you were released from the hospital? Pt states she is doing ok   Do you understand why you were in the hospital? YES   Do you understand the discharge instrcutions? YES  Items Reviewed:  Medications reviewed: YES  Allergies reviewed: YES  Dietary changes reviewed: NO  Referrals reviewed: no referral recccomended   Functional Questionnaire:   Activities of Daily Living (ADLs):   She states they are independent in the following: bathing and hygiene, feeding, continence, grooming, toileting and dressing States they require assistance with the following: ambulation   Any transportation issues/concerns?: NO   Any patient concerns? NO   Confirmed importance and date/time of follow-up visits scheduled: YES, appt made 04/19/15 w/Dr. Yetta Barre   Confirmed with patient if condition begins to worsen call PCP or go to the ER.

## 2015-04-10 ENCOUNTER — Inpatient Hospital Stay (HOSPITAL_COMMUNITY): Admit: 2015-04-10 | Payer: Medicare Other

## 2015-04-13 ENCOUNTER — Encounter: Payer: Self-pay | Admitting: Internal Medicine

## 2015-04-17 NOTE — Progress Notes (Signed)
Patient d/c'd back to [facility] today via EMS. Ok per [facility]- Admissions at [facility].  Notified patient and her daughter [name] who were anxious for return to facility.  Nursing notified to call report to facility. CSW signing off.  Lorri Frederick. Jaci Lazier, [place] [phone]

## 2015-04-18 ENCOUNTER — Encounter (HOSPITAL_COMMUNITY): Payer: Self-pay

## 2015-04-18 ENCOUNTER — Ambulatory Visit (HOSPITAL_COMMUNITY)
Admit: 2015-04-18 | Discharge: 2015-04-18 | Disposition: A | Discharge status: Home / Self Care | Payer: No Typology Code available for payment source | Source: Ambulatory Visit | Attending: Cardiology | Admitting: Cardiology

## 2015-04-18 VITALS — BP 109/50 | HR 93 | Resp 20 | Wt 143.0 lb

## 2015-04-18 DIAGNOSIS — I340 Nonrheumatic mitral (valve) insufficiency: Secondary | ICD-10-CM | POA: Diagnosis not present

## 2015-04-18 DIAGNOSIS — I5022 Chronic systolic (congestive) heart failure: Secondary | ICD-10-CM | POA: Diagnosis not present

## 2015-04-18 DIAGNOSIS — E785 Hyperlipidemia, unspecified: Secondary | ICD-10-CM | POA: Insufficient documentation

## 2015-04-18 DIAGNOSIS — I6529 Occlusion and stenosis of unspecified carotid artery: Secondary | ICD-10-CM | POA: Insufficient documentation

## 2015-04-18 DIAGNOSIS — N184 Chronic kidney disease, stage 4 (severe): Secondary | ICD-10-CM | POA: Diagnosis not present

## 2015-04-18 DIAGNOSIS — I350 Nonrheumatic aortic (valve) stenosis: Secondary | ICD-10-CM | POA: Insufficient documentation

## 2015-04-18 MED ORDER — FUROSEMIDE 80 MG PO TABS: 80.0000 mg | ORAL_TABLET | Freq: Every day | ORAL | Status: DC

## 2015-04-18 MED ORDER — METOLAZONE 5 MG PO TABS: 5.0000 mg | ORAL_TABLET | Freq: Every day | ORAL | Status: DC | PRN

## 2015-04-18 NOTE — Progress Notes (Signed)
Patient ID: Joanna [name], female   DOB: 1930/02/25, 81 y.o.   MRN: [id number] PCP: Dr Yetta Barre ([facility] [place]) CVTS Surgeon: Dr. Cornelius Moras Primary Cardiologist: Dr. Gala Romney DNR/DNI  HPI: Ms. Boehning is a 81 year old woman with aortic stenosis s/p AVR/mitral valve replacement (06/15/2013), systolic HF due to NICM, hypertension, diabetes mellitus type II, hyperlipidemia, and peripheral artery disease.  Underwent AVR/MVR with placement of epicardial lead on 06/15/13 with PAF post-op terminated with amiodarone.  Upgraded to [facility] Jude CRT-D 03/22/2015.    Admitted from 5/27 through 03/30/2015 with acute/chronic systolic HF and cardiogenic shock. She was diuresed with IV lasix and milrinone. Unable to wean milrinone and discharged on 0.25 mcg. While hospitalized CRT-D placed on 6/2. She was discharged to [facility].   Readmitted 6/11 with hypoglycemia. Also had sepsis with HCAP and received antibiotic course. She remained on milrinone 0.375 mcg. Discharge weight was 128 pounds.   Today she returns for post hospital follow up. She continues on milrinone 0.25 mcg via PICC. Overall feeling pretty good. Denies  SOB/PND/Orthopneat. Does admit to mild dyspnea when completing rehab. Weight at SNF 128 pounds. Food provided at [facility]. She remains at [facility] SNF.    05/19/2013 ECHO EF 35% severe AS (low gradient severe AS confirmed by DSE), gradient: 32 mm Hg (S). Peak gradient: 57mm Hg  08/18/13 ECHO EF 45-50% AV working well 2/16 ECHO with EF 20-25%, moderately dilated LV with mild LVH, normal bioprosthetic mitral and aortic valves, moderate TR, PA systolic pressure 54 mmHg.  4/16 ECHO EF 20-25%, diffuse hypokinesis, mild LVH, bioprosthetic aortic valve functioning normally, bioprosthetic mitral valve with mild central MR and mean gradient 4 mmHg.   Labs 9/14: K 5.4, creatinine 1.69 09/2013: K+ 4.6, creatinine 1.08, pro-BNP 11643 10/17/13: K+ 4.4, creatinine 1.6 11/28/14: K+ 4.1, Creatinine 1.6 12/15/14: K 3.5, creatinine  1.72 4/16: K 4.2, creatinine 1.86, hgb 8.8 5/16: K 4 => 3.1, creatinine 1.68 => 1.61, BNP 4039 => 4185 04/09/2015: K 3.4 Creatinine 2.01  04/16/2915: K 4.2 Creatinine 1.8 Hgb 10.3    ROS:  12-point review of systems went over in clinic and all negative except as described in HPI and problem list.  Past Medical History  Diagnosis Date  . Peripheral artery disease   . Hyperlipidemia        . Arthritis   . Aortic stenosis      a. s/p AVR  . BUNDLE BRANCH BLOCK, LEFT    . CAROTID ARTERY STENOSIS 01/05/2009  . CVA 03/14/2010  . PERIPHERAL VASCULAR DISEASE 07/30/2007  . Chronic combined systolic and diastolic CHF (congestive heart failure)     a. RHC 09/2011 RA 8, RV 58/2/11; PA 54/22 (37) PCWP 20; F CO/CI 4.57/2.67 PVR 3.7; b. L main no sig dz, LAD luminal irreg; LCx lg Ramus with luminal irreg, small AV Lcx witout sig dz, RCA luminal irreg; severe mitral annular calcifications; AV calcified c. EF 45-50% (07/2013);  d. 11/2014 Echo: EF 20-25%, diff HK, nl AV/MV, sev dil LA, mod TR/PR, PASP .  . Diabetes mellitus   . HTN (hypertension)   . Iron deficiency anemia 09/21/2011  . History of shingles   . Mitral regurgitation     a. s/p MVR  . S/P aortic valve replacement with bioprosthetic valve 06/15/2013    21 mm [facility] Ease bovine pericardial tissue valve  . S/P mitral valve replacement with bioprosthetic valve 06/15/2013    25 mm [facility] Mitral bovine pericardial tissue valve  . Fracture, fibula,  shaft 01/30/2015    right     Current Outpatient Prescriptions  Medication Sig Dispense Refill  . amiodarone (PACERONE) 200 MG tablet Take 1 tablet (200 mg total) by mouth 2 (two) times daily. 60 tablet 6  . apixaban (ELIQUIS) 2.5 MG TABS tablet Take 1 tablet (2.5 mg total) by mouth 2 (two) times daily. 60 tablet 6  . calcium citrate-vitamin D (CITRACAL+D) 315-200 MG-UNIT per tablet Take 1 tablet by mouth daily.    Marland Kitchen docusate sodium (COLACE) 100 MG capsule Take 100 mg by mouth 2  (two) times daily.      . ferrous sulfate 325 (65 FE) MG tablet Take 1 tablet (325 mg total) by mouth 2 (two) times daily with a meal. 180 tablet 1  . furosemide (LASIX) 40 MG tablet Take 1.5 tablets (60 mg total) by mouth daily. 45 tablet 6  . hydrALAZINE (APRESOLINE) 10 MG tablet Take 1 tablet (10 mg total) by mouth every 8 (eight) hours. 120 tablet 6  . insulin aspart (NOVOLOG) 100 UNIT/ML injection Inject 0-9 Units into the skin 3 (three) times daily with meals. Sliding scale CBG 70 - 120: 0 units CBG 121 - 150: 1 unit,  CBG 151 - 200: 2 units,  CBG 201 - 250: 3 units,  CBG 251 - 300: 5 units,  CBG 301 - 350: 7 units,  CBG 351 - 400: 9 units   CBG > 400: 9 units and notify your MD 10 mL 11  . Insulin Pen Needle (NOVOFINE) 32G X 6 MM MISC Use daily with insulin 300 each 3  . isosorbide mononitrate (IMDUR) 30 MG 24 hr tablet Take 0.5 tablets (15 mg total) by mouth daily. 30 tablet 6  . milrinone (PRIMACOR) 20 MG/100ML SOLN infusion Inject 15.475 mcg/min into the vein continuous. 100 mL 6  . potassium chloride SA (K-DUR,KLOR-CON) 20 MEQ tablet Take 2 tablets (40 mEq total) by mouth 2 (two) times daily. 60 tablet 6  . metolazone (ZAROXOLYN) 5 MG tablet Take 1 tablet (5 mg total) by mouth See admin instructions. Take as needed for 3 lb weight gain over discharge weight of 132 lbs. Give prior to Lasix. (Patient not taking: Reported on 04/18/2015) 30 tablet 6   No current facility-administered medications for this encounter.   History   Social History  . Marital Status: Divorced    Spouse Name: N/A  . Number of Children: N/A  . Years of Education: N/A   Social History Main Topics  . Smoking status: Never Smoker   . Smokeless tobacco: Never Used  . Alcohol Use: No  . Drug Use: No  . Sexual Activity: No   Other Topics Concern  . None   Social History Narrative   Patient lives alone here in town   She continues to work, helping to bind and oversew books   She is divorced after 23 years of  marriage   1 son- '61, minister; 1 dtr '55; 4 grandchildren   End of life care-provided packet on living well   Family History  Problem Relation Age of Onset  . Colon cancer Mother   . Diabetes Neg Hx   . Coronary artery disease Neg Hx     Filed Vitals:   04/18/15 1436  BP: 109/50  Pulse: 93  Resp: 20  Weight: 143 lb (64.864 kg)  SpO2: 96%    PHYSICAL EXAM: General:  Elderly. NAD; Son present.  HEENT: normal Neck: supple. JVP to jaw.  Carotids 2+ bilaterally; no  bruits. No lymphadenopathy or thryomegaly appreciated. Cor: PMI normal. Regular rate & rhythm. 1/6 SEM. +S3.    Lungs: CTA  Abdomen: soft, nontender, nondistended. No hepatosplenomegaly. No bruits or masses. Good bowel sounds. Extremities: no cyanosis, clubbing, rash.  R and LLE 3+ edema to knees bilaterally RUE PICC  Neuro: alert & orientedx3, cranial nerves grossly intact. Moves all 4 extremities w/o difficulty. Affect pleasant  ASSESSMENT & PLAN: 1. Chronic systolic CHF: EF back down to 16-10% (4/16).  Nonischemic cardiomyopathy.  On chronic milrinone 0.25 mcg. NYHA class III symptoms. No beta blocker with low output.  REDS Vest Score 41. Volume status elevated. - Increase Lasix to 80 mg daily and give 5 mg metolazone today.  Continue current dose of hydralazine/imdur. No Arb with CKD.    - BMET/BNP today and repeat in 1 week.  -   - 2. Carotid stenosis: 40-59% RICA stenosis, repeat carotids in 3/17.  3. Hyperlipidemia: LDL 93 in 3/16, would not change statin.  4.  Aortic stenosis/mitral regurgitation: s/p bioprosthetic AVR/MVR.   5. CKD: BMET today.  6. Post-op AF:  No evidence of recurrence. 7. St Jude CRT-D -Followed by EP      CLEGG,AMY, NP-C  04/18/2015   Patient seen and examined with Tonye Becket, NP. We discussed all aspects of the encounter. I agree with the assessment and plan as stated above.   She is volume overloaded again. Agree with increasing lasix and adding metolazone. Will continue milrinone.  Labs today and next week. Follow closely. She is now DNR.   Bensimhon, Daniel,MD 10:26 PM

## 2015-04-19 ENCOUNTER — Encounter: Payer: Self-pay | Admitting: *Deleted

## 2015-04-19 ENCOUNTER — Other Ambulatory Visit: Payer: Self-pay | Admitting: *Deleted

## 2015-04-19 ENCOUNTER — Ambulatory Visit (INDEPENDENT_AMBULATORY_CARE_PROVIDER_SITE_OTHER): Payer: Medicare Other | Admitting: Internal Medicine

## 2015-04-19 VITALS — BP 102/60 | HR 83 | Temp 97.8°F | Resp 16 | Ht 64.0 in | Wt 138.2 lb

## 2015-04-19 DIAGNOSIS — I5022 Chronic systolic (congestive) heart failure: Secondary | ICD-10-CM | POA: Diagnosis not present

## 2015-04-19 DIAGNOSIS — I10 Essential (primary) hypertension: Secondary | ICD-10-CM | POA: Diagnosis not present

## 2015-04-19 NOTE — Patient Instructions (Signed)
Anemia, Nonspecific Anemia is a condition in which the concentration of red blood cells or hemoglobin in the blood is below normal. Hemoglobin is a substance in red blood cells that carries oxygen to the tissues of the body. Anemia results in not enough oxygen reaching these tissues.  CAUSES  Common causes of anemia include:   Excessive bleeding. Bleeding may be internal or external. This includes excessive bleeding from periods (in women) or from the intestine.   Poor nutrition.   Chronic kidney, thyroid, and liver disease.  Bone marrow disorders that decrease red blood cell production.  Cancer and treatments for cancer.  HIV, AIDS, and their treatments.  Spleen problems that increase red blood cell destruction.  Blood disorders.  Excess destruction of red blood cells due to infection, medicines, and autoimmune disorders. SIGNS AND SYMPTOMS   Minor weakness.   Dizziness.   Headache.  Palpitations.   Shortness of breath, especially with exercise.   Paleness.  Cold sensitivity.  Indigestion.  Nausea.  Difficulty sleeping.  Difficulty concentrating. Symptoms may occur suddenly or they may develop slowly.  DIAGNOSIS  Additional blood tests are often needed. These help your health care provider determine the best treatment. Your health care provider will check your stool for blood and look for other causes of blood loss.  TREATMENT  Treatment varies depending on the cause of the anemia. Treatment can include:   Supplements of iron, vitamin B12, or folic acid.   Hormone medicines.   A blood transfusion. This may be needed if blood loss is severe.   Hospitalization. This may be needed if there is significant continual blood loss.   Dietary changes.  Spleen removal. HOME CARE INSTRUCTIONS Keep all follow-up appointments. It often takes many weeks to correct anemia, and having your health care provider check on your condition and your response to  treatment is very important. SEEK IMMEDIATE MEDICAL CARE IF:   You develop extreme weakness, shortness of breath, or chest pain.   You become dizzy or have trouble concentrating.  You develop heavy vaginal bleeding.   You develop a rash.   You have bloody or black, tarry stools.   You faint.   You vomit up blood.   You vomit repeatedly.   You have abdominal pain.  You have a fever or persistent symptoms for more than 2-3 days.   You have a fever and your symptoms suddenly get worse.   You are dehydrated.  MAKE SURE YOU:  Understand these instructions.  Will watch your condition.  Will get help right away if you are not doing well or get worse. Document Released: 11/13/2004 Document Revised: 06/08/2013 Document Reviewed: 04/01/2013 ExitCare Patient Information 2015 [employer]. This information is not intended to replace advice given to you by your health care provider. Make sure you discuss any questions you have with your health care provider.  

## 2015-04-19 NOTE — Patient Outreach (Signed)
[facility]) Care Management  04/19/2015  DORETHEA STRUBEL [birth year] [id number]  CSW was able to make initial contact with patient today to assess need for social work involvement.  CSW was able to speak with patient briefly to obtain two HIPAA compliant identifiers, name and date of birth, as well as obtain verbal consent to converse with her daughter, Virgilio Belling, regarding discharge planning needs.  Mrs. Justus Memory was available at the time of CSW's call, visiting with patient at [facility], where patient currently resides.  CSW introduced self, explained role and types of services provided through [employer].  Mrs. Justus Memory reported that patient will remain at [name] for long-term care, as family is no longer able to adequately care for patient at home.  A palliative care consult was performed while patient was hospitalized.  CSW and Mrs. Justus Memory were unable to identify any social work specific needs at this time; therefore, CSW will be performing a case closure.  Mrs. Justus Memory has CSW's contact information and has been encouraged to contact CSW directly if social work needs arise in the future.  Mrs. Justus Memory voiced understanding and was agreeable to this plan. CSW will no longer make arrangements to contact patient by phone or in person, as all goals of treatment have been met from social work standpoint and no additional social work needs have been identified at this time. CSW will notify Marthenia Rolling, Hospital Liaison with [facility], of CSW's plans to close patients case. CSW will submit a case closure request to Lurline Del, Care Management Assistant with [facility], in the form of an [employer]. CSW will fax a correspondence letter to patient's Primary Care Physician, Dr. Scarlette Calico to report social work involvement with patient.  Nat Christen, BSW, MSW,  [facility] [place], [address] [place], [place] [address] [email or web address] [phone]

## 2015-04-19 NOTE — Progress Notes (Signed)
Pre visit review using our clinic review tool, if applicable. No additional management support is needed unless otherwise documented below in the visit note. 

## 2015-04-19 NOTE — Progress Notes (Signed)
Subjective:  Patient ID: Joanna [name], female    DOB: 10/19/1930  Age: 81 y.o. MRN: [id number]  CC: Congestive Heart Failure   HPI Joanna [name] presents for a hospital f/up, she is receiving IV continuous milrinone for CHF and feels like she is improved. Today she complains that there is a bruise on her lower back after a fall she sustained about 1 week ago. She does not report any pain in the area. She has stable edema on her LLE and a boot over her RLE s/p an ankle fracture.  Outpatient Prescriptions Prior to Visit  Medication Sig Dispense Refill  . amiodarone (PACERONE) 200 MG tablet Take 1 tablet (200 mg total) by mouth 2 (two) times daily. 60 tablet 6  . apixaban (ELIQUIS) 2.5 MG TABS tablet Take 1 tablet (2.5 mg total) by mouth 2 (two) times daily. 60 tablet 6  . calcium citrate-vitamin D (CITRACAL+D) 315-200 MG-UNIT per tablet Take 1 tablet by mouth daily.    Marland Kitchen docusate sodium (COLACE) 100 MG capsule Take 100 mg by mouth 2 (two) times daily.      . ferrous sulfate 325 (65 FE) MG tablet Take 1 tablet (325 mg total) by mouth 2 (two) times daily with a meal. 180 tablet 1  . furosemide (LASIX) 80 MG tablet Take 1 tablet (80 mg total) by mouth daily. 30 tablet 6  . hydrALAZINE (APRESOLINE) 10 MG tablet Take 1 tablet (10 mg total) by mouth every 8 (eight) hours. 120 tablet 6  . insulin aspart (NOVOLOG) 100 UNIT/ML injection Inject 0-9 Units into the skin 3 (three) times daily with meals. Sliding scale CBG 70 - 120: 0 units CBG 121 - 150: 1 unit,  CBG 151 - 200: 2 units,  CBG 201 - 250: 3 units,  CBG 251 - 300: 5 units,  CBG 301 - 350: 7 units,  CBG 351 - 400: 9 units   CBG > 400: 9 units and notify your MD 10 mL 11  . Insulin Pen Needle (NOVOFINE) 32G X 6 MM MISC Use daily with insulin 300 each 3  . isosorbide mononitrate (IMDUR) 30 MG 24 hr tablet Take 0.5 tablets (15 mg total) by mouth daily. 30 tablet 6  . metolazone (ZAROXOLYN) 5 MG tablet Take 1 tablet (5 mg total) by mouth daily as  needed. For weight 133 lbs or greater. 30 tablet 6  . milrinone (PRIMACOR) 20 MG/100ML SOLN infusion Inject 15.475 mcg/min into the vein continuous. 100 mL 6  . potassium chloride SA (K-DUR,KLOR-CON) 20 MEQ tablet Take 2 tablets (40 mEq total) by mouth 2 (two) times daily. 60 tablet 6   No facility-administered medications prior to visit.    ROS Review of Systems  Constitutional: Positive for fatigue. Negative for fever, chills and appetite change.  HENT: Negative.   Eyes: Negative.   Respiratory: Positive for shortness of breath. Negative for cough, choking, chest tightness, wheezing and stridor.   Cardiovascular: Positive for leg swelling. Negative for chest pain and palpitations.  Gastrointestinal: Negative.  Negative for nausea, vomiting, abdominal pain, diarrhea, constipation and blood in stool.  Endocrine: Negative.   Genitourinary: Negative.  Negative for dysuria, urgency, frequency, hematuria, flank pain, decreased urine volume and difficulty urinating.  Musculoskeletal: Negative.  Negative for myalgias, back pain, joint swelling, arthralgias and neck pain.  Skin: Negative.  Negative for rash.  Allergic/Immunologic: Negative.   Neurological: Negative.  Negative for dizziness, syncope, light-headedness and numbness.  Hematological: Negative.  Negative for adenopathy. Does  not bruise/bleed easily.  Psychiatric/Behavioral: Negative.     Objective:  BP 102/60 mmHg  Pulse 83  Temp(Src) 97.8 F (36.6 C) (Oral)  Ht 5\' 4"  (1.626 m)  Wt 138 lb 4 oz (62.71 kg)  BMI 23.72 kg/m2  SpO2 95%  BP Readings from Last 3 Encounters:  04/19/15 102/60  04/18/15 109/50  04/09/15 137/54    Wt Readings from Last 3 Encounters:  04/19/15 138 lb 4 oz (62.71 kg)  04/18/15 143 lb (64.864 kg)  04/09/15 128 lb 9.6 oz (58.333 kg)    Physical Exam  Constitutional: She is oriented to person, place, and time. No distress.  HENT:  Mouth/Throat: Oropharynx is clear and moist. No oropharyngeal  exudate.  Eyes: Conjunctivae are normal. Right eye exhibits no discharge. Left eye exhibits no discharge. No scleral icterus.  Neck: Normal range of motion. Neck supple. No JVD present. No tracheal deviation present. No thyromegaly present.  Cardiovascular: Normal rate and intact distal pulses.  An irregularly irregular rhythm present. Exam reveals gallop and S4. Exam reveals no S3 and no friction rub.   No murmur heard. Pulmonary/Chest: Effort normal and breath sounds normal. No stridor. No respiratory distress. She has no wheezes. She has no rales. She exhibits no tenderness.  Abdominal: Soft. Bowel sounds are normal. She exhibits no distension and no mass. There is no tenderness. There is no rebound and no guarding.  Musculoskeletal: Normal range of motion. She exhibits edema (2+ pitting edema in LLE). She exhibits no tenderness.       Arms: Lymphadenopathy:    She has no cervical adenopathy.  Neurological: She is oriented to person, place, and time.  Skin: Skin is warm and dry. No rash noted. She is not diaphoretic. No erythema. No pallor.  Psychiatric: She has a normal mood and affect. Her behavior is normal. Judgment and thought content normal.    Lab Results  Component Value Date   WBC 12.5* 04/03/2015   HGB 10.3* 04/03/2015   HCT 32.4* 04/03/2015   PLT 189 04/03/2015   GLUCOSE 188* 04/09/2015   CHOL 169 12/25/2014   TRIG 108.0 12/25/2014   HDL 54.70 12/25/2014   LDLDIRECT 120.6 11/28/2013   LDLCALC 93 12/25/2014   ALT 27 03/31/2015   AST 44* 03/31/2015   NA 136 04/09/2015   K 3.4* 04/09/2015   CL 95* 04/09/2015   CREATININE 2.01* 04/09/2015   BUN 42* 04/09/2015   CO2 32 04/09/2015   TSH 2.010 12/13/2014   INR 3.3 07/07/2014   HGBA1C 7.8* 12/25/2014   MICROALBUR 3.8* 12/25/2014    No results found.  Assessment & Plan:   There are no diagnoses linked to this encounter. I am having [name] maintain her docusate sodium, ferrous sulfate, calcium citrate-vitamin D,  Insulin Pen Needle, amiodarone, apixaban, hydrALAZINE, isosorbide mononitrate, milrinone, insulin aspart, potassium chloride SA, metolazone, and furosemide.  No orders of the defined types were placed in this encounter.     Follow-up: Return in about 3 months (around 07/20/2015).  Sanda Linger, MD

## 2015-04-21 ENCOUNTER — Encounter: Payer: Self-pay | Admitting: Internal Medicine

## 2015-04-21 NOTE — Assessment & Plan Note (Signed)
She appears to have a stable volume status today She has an ecchymosis over her lower back but no pain so I did not do an xray today Will cont the current regimen for CHF

## 2015-04-21 NOTE — Assessment & Plan Note (Signed)
Her BP is well controlled 

## 2015-04-24 NOTE — Patient Outreach (Signed)
[facility]) Care Management  04/24/2015  Joanna [name] October 30, 1929 [id number]   Notification from Danford Bad, LCSW to close case as patient will remain at [facility] for long-term care placement.  Joanna [name]. Sharlee Blew [facility] Care Management [facility] CM Assistant Phone: [phone] Fax: [phone]

## 2015-05-17 ENCOUNTER — Encounter (HOSPITAL_COMMUNITY): Payer: Self-pay

## 2015-05-17 ENCOUNTER — Ambulatory Visit (HOSPITAL_COMMUNITY)
Admission: RE | Admit: 2015-05-17 | Discharge: 2015-05-17 | Disposition: A | Discharge status: Home / Self Care | Payer: No Typology Code available for payment source | Source: Ambulatory Visit | Attending: Internal Medicine | Admitting: Internal Medicine

## 2015-05-17 VITALS — BP 114/64 | HR 80 | Wt 132.5 lb

## 2015-05-17 DIAGNOSIS — I480 Paroxysmal atrial fibrillation: Secondary | ICD-10-CM

## 2015-05-17 DIAGNOSIS — I5022 Chronic systolic (congestive) heart failure: Secondary | ICD-10-CM | POA: Diagnosis not present

## 2015-05-17 DIAGNOSIS — Z952 Presence of prosthetic heart valve: Secondary | ICD-10-CM | POA: Insufficient documentation

## 2015-05-17 DIAGNOSIS — I739 Peripheral vascular disease, unspecified: Secondary | ICD-10-CM | POA: Insufficient documentation

## 2015-05-17 DIAGNOSIS — E785 Hyperlipidemia, unspecified: Secondary | ICD-10-CM | POA: Insufficient documentation

## 2015-05-17 DIAGNOSIS — Z794 Long term (current) use of insulin: Secondary | ICD-10-CM | POA: Insufficient documentation

## 2015-05-17 DIAGNOSIS — I429 Cardiomyopathy, unspecified: Secondary | ICD-10-CM | POA: Insufficient documentation

## 2015-05-17 DIAGNOSIS — I129 Hypertensive chronic kidney disease with stage 1 through stage 4 chronic kidney disease, or unspecified chronic kidney disease: Secondary | ICD-10-CM | POA: Diagnosis not present

## 2015-05-17 DIAGNOSIS — Z79899 Other long term (current) drug therapy: Secondary | ICD-10-CM | POA: Insufficient documentation

## 2015-05-17 DIAGNOSIS — N189 Chronic kidney disease, unspecified: Secondary | ICD-10-CM | POA: Insufficient documentation

## 2015-05-17 DIAGNOSIS — Z7902 Long term (current) use of antithrombotics/antiplatelets: Secondary | ICD-10-CM | POA: Diagnosis not present

## 2015-05-17 DIAGNOSIS — N184 Chronic kidney disease, stage 4 (severe): Secondary | ICD-10-CM

## 2015-05-17 DIAGNOSIS — E1122 Type 2 diabetes mellitus with diabetic chronic kidney disease: Secondary | ICD-10-CM | POA: Insufficient documentation

## 2015-05-17 DIAGNOSIS — I6521 Occlusion and stenosis of right carotid artery: Secondary | ICD-10-CM | POA: Insufficient documentation

## 2015-05-17 MED ORDER — METOLAZONE 5 MG PO TABS: 5.0000 mg | ORAL_TABLET | ORAL | Status: DC

## 2015-05-17 MED ORDER — AMIODARONE HCL 200 MG PO TABS: 200.0000 mg | ORAL_TABLET | Freq: Every day | ORAL | Status: DC

## 2015-05-17 NOTE — Patient Instructions (Addendum)
CHANGE Metolazone to once weekly on Fridays. Take extra 20 meq Potassium with Metolazone.  DECREASE Amiodarone to 200 mg tablet ONCE daily.  Follow up 6 weeks.  Do the following things EVERYDAY: 1) Weigh yourself in the morning before breakfast. Write it down and keep it in a log. 2) Take your medicines as prescribed 3) Eat low salt foods-Limit salt (sodium) to 2000 mg per day.  4) Stay as active as you can everyday 5) Limit all fluids for the day to less than 2 liters

## 2015-05-17 NOTE — Progress Notes (Signed)
Patient ID: Joanna [name], female   DOB: 1930-08-25, 81 y.o.   MRN: [id number] PCP: Dr Yetta Barre ([facility] [place]) CVTS Surgeon: Dr. Cornelius Moras Primary Cardiologist: Dr. Gala Romney DNR/DNI  HPI: [name] is a 81 year old woman with aortic stenosis s/p AVR/mitral valve replacement (06/15/2013), systolic HF due to NICM, hypertension, diabetes mellitus type II, hyperlipidemia, and peripheral artery disease.  Underwent AVR/MVR with placement of epicardial lead on 06/15/13 with PAF post-op terminated with amiodarone.  Upgraded to [facility] Jude CRT-D 03/22/2015.    Admitted from 5/27 through 03/30/2015 with acute/chronic systolic HF and cardiogenic shock. She was diuresed with IV lasix and milrinone. Unable to wean milrinone and discharged on 0.25 mcg. While hospitalized CRT-D placed on 6/2. She was discharged to [facility].   Readmitted 6/11 with hypoglycemia. Also had sepsis with HCAP and received antibiotic course. She remained on milrinone 0.375 mcg. Discharge weight was 128 pounds.   Here for HF f/u: At last visit, she was volume overloaded and lasix increased to 80 daily and metolazone x 1 given   Today she returns for post hospital follow up. She remains at [facility]. She continues on milrinone 0.25 mcg via PICC. Overall feeling pretty good. Walking with walker. Denies  SOB/PND/Orthopnea. Weight up 5 pounds from lowest weight. Unfortunately she does not have her medicine list with her today from [name]. They have been limiting fluid intake.    05/19/2013 ECHO EF 35% severe AS (low gradient severe AS confirmed by DSE), gradient: 32 mm Hg (S). Peak gradient: 57mm Hg  08/18/13 ECHO EF 45-50% AV working well 2/16 ECHO with EF 20-25%, moderately dilated LV with mild LVH, normal bioprosthetic mitral and aortic valves, moderate TR, PA systolic pressure 54 mmHg.  4/16 ECHO EF 20-25%, diffuse hypokinesis, mild LVH, bioprosthetic aortic valve functioning normally, bioprosthetic mitral valve with mild central MR and  mean gradient 4 mmHg.   Labs 9/14: K 5.4, creatinine 1.69 09/2013: K+ 4.6, creatinine 1.08, pro-BNP 11643 10/17/13: K+ 4.4, creatinine 1.6 11/28/14: K+ 4.1, Creatinine 1.6 12/15/14: K 3.5, creatinine 1.72 4/16: K 4.2, creatinine 1.86, hgb 8.8 5/16: K 4 => 3.1, creatinine 1.68 => 1.61, BNP 4039 => 4185 04/09/2015: K 3.4 Creatinine 2.01  04/16/2915: K 4.2 Creatinine 1.8 Hgb 10.3    ROS:  12-point review of systems went over in clinic and all negative except as described in HPI and problem list.  Past Medical History  Diagnosis Date  . Peripheral artery disease   . Hyperlipidemia        . Arthritis   . Aortic stenosis      a. s/p AVR  . BUNDLE BRANCH BLOCK, LEFT    . CAROTID ARTERY STENOSIS 01/05/2009  . CVA 03/14/2010  . PERIPHERAL VASCULAR DISEASE 07/30/2007  . Chronic combined systolic and diastolic CHF (congestive heart failure)     a. RHC 09/2011 RA 8, RV 58/2/11; PA 54/22 (37) PCWP 20; F CO/CI 4.57/2.67 PVR 3.7; b. L main no sig dz, LAD luminal irreg; LCx lg Ramus with luminal irreg, small AV Lcx witout sig dz, RCA luminal irreg; severe mitral annular calcifications; AV calcified c. EF 45-50% (07/2013);  d. 11/2014 Echo: EF 20-25%, diff HK, nl AV/MV, sev dil LA, mod TR/PR, PASP .  . Diabetes mellitus   . HTN (hypertension)   . Iron deficiency anemia 09/21/2011  . History of shingles   . Mitral regurgitation     a. s/p MVR  . S/P aortic valve replacement with bioprosthetic valve 06/15/2013  21 mm [facility] Ease bovine pericardial tissue valve  . S/P mitral valve replacement with bioprosthetic valve 06/15/2013    25 mm [facility] Mitral bovine pericardial tissue valve  . Fracture, fibula, shaft 01/30/2015    right     Current Outpatient Prescriptions  Medication Sig Dispense Refill  . amiodarone (PACERONE) 200 MG tablet Take 1 tablet (200 mg total) by mouth 2 (two) times daily. 60 tablet 6  . apixaban (ELIQUIS) 2.5 MG TABS tablet Take 1 tablet (2.5 mg total) by  mouth 2 (two) times daily. 60 tablet 6  . calcium citrate-vitamin D (CITRACAL+D) 315-200 MG-UNIT per tablet Take 1 tablet by mouth daily.    Marland Kitchen docusate sodium (COLACE) 100 MG capsule Take 100 mg by mouth 2 (two) times daily.      . ferrous sulfate 325 (65 FE) MG tablet Take 1 tablet (325 mg total) by mouth 2 (two) times daily with a meal. 180 tablet 1  . furosemide (LASIX) 80 MG tablet Take 1 tablet (80 mg total) by mouth daily. 30 tablet 6  . hydrALAZINE (APRESOLINE) 10 MG tablet Take 1 tablet (10 mg total) by mouth every 8 (eight) hours. 120 tablet 6  . insulin aspart (NOVOLOG) 100 UNIT/ML injection Inject 0-9 Units into the skin 3 (three) times daily with meals. Sliding scale CBG 70 - 120: 0 units CBG 121 - 150: 1 unit,  CBG 151 - 200: 2 units,  CBG 201 - 250: 3 units,  CBG 251 - 300: 5 units,  CBG 301 - 350: 7 units,  CBG 351 - 400: 9 units   CBG > 400: 9 units and notify your MD 10 mL 11  . Insulin Pen Needle (NOVOFINE) 32G X 6 MM MISC Use daily with insulin 300 each 3  . isosorbide mononitrate (IMDUR) 30 MG 24 hr tablet Take 0.5 tablets (15 mg total) by mouth daily. 30 tablet 6  . metolazone (ZAROXOLYN) 5 MG tablet Take 1 tablet (5 mg total) by mouth daily as needed. For weight 133 lbs or greater. 30 tablet 6  . milrinone (PRIMACOR) 20 MG/100ML SOLN infusion Inject 15.475 mcg/min into the vein continuous. 100 mL 6  . potassium chloride SA (K-DUR,KLOR-CON) 20 MEQ tablet Take 2 tablets (40 mEq total) by mouth 2 (two) times daily. 60 tablet 6   No current facility-administered medications for this encounter.   History   Social History  . Marital Status: Divorced    Spouse Name: N/A  . Number of Children: N/A  . Years of Education: N/A   Social History Main Topics  . Smoking status: Never Smoker   . Smokeless tobacco: Never Used  . Alcohol Use: No  . Drug Use: No  . Sexual Activity: No   Other Topics Concern  . None   Social History Narrative   Patient lives alone here in town    She continues to work, helping to bind and oversew books   She is divorced after 23 years of marriage   1 son- '61, minister; 1 dtr '55; 4 grandchildren   End of life care-provided packet on living well   Family History  Problem Relation Age of Onset  . Colon cancer Mother   . Diabetes Neg Hx   . Coronary artery disease Neg Hx     Filed Vitals:   05/17/15 1433  BP: 114/64  Pulse: 80  Weight: 132 lb 8 oz (60.102 kg)  SpO2: 95%    PHYSICAL EXAM: General:  Elderly.  NAD; Son and daughter present.  HEENT: normal Neck: supple. JVP 8-9  Carotids 2+ bilaterally; no bruits. No lymphadenopathy or thryomegaly appreciated. Cor: PMI normal. Regular rate & rhythm. 1/6 SEM. +S3.    Lungs: CTA  Abdomen: soft, nontender, nondistended. No hepatosplenomegaly. No bruits or masses. Good bowel sounds. Extremities: no cyanosis, clubbing, rash.  R and LLE 1-2+ edemabilaterally RUE PICC. Brace on RLE Neuro: alert & orientedx3, cranial nerves grossly intact. Moves all 4 extremities w/o difficulty. Affect pleasant  ASSESSMENT & PLAN: 1. Chronic systolic CHF: EF back down to 16-10% (4/16).  Nonischemic cardiomyopathy.  On chronic milrinone 0.25 mcg.  --doing fairly well NYHA II-III on milrinone. Volume status mildly elevated --will add metolazone 2.5 mg with 20 KCL once a week. Continue with weekly BMET --Continue current dose of hydralazine/imdur. No Arb with CKD.    - BMET/BNP today and repeat in 1 week.  -   2. Carotid stenosis: 40-59% RICA stenosis stable 3. Hyperlipidemia: LDL 93 in 3/16, would not change statin.  4.  Aortic stenosis/mitral regurgitation: s/p bioprosthetic AVR/MVR.   5. CKD: follow BMET 6. PAF:  No evidence of recurrence.Continue apixaban.Reduce amiodarone to 200 daily,. 7. St Jude CRT-D -Followed by EP      Arvilla Meres, MD  05/17/2015

## 2015-05-17 NOTE — Addendum Note (Signed)
Encounter addended by: Chyrl Civatte, RN on: 05/17/2015  3:15 PM<BR>     Documentation filed: Orders, Dx Association, Patient Instructions Section

## 2015-05-25 ENCOUNTER — Telehealth (HOSPITAL_COMMUNITY): Payer: Self-pay | Admitting: Cardiology

## 2015-05-25 DIAGNOSIS — I5022 Chronic systolic (congestive) heart failure: Secondary | ICD-10-CM

## 2015-05-25 MED ORDER — MAGNESIUM OXIDE -MG SUPPLEMENT 200 MG PO TABS: 200.0000 mg | ORAL_TABLET | Freq: Every day | ORAL | Status: DC

## 2015-05-25 NOTE — Telephone Encounter (Signed)
ABNORMAL LABS Labs drawn 05/22/15 via [facility] Mg 1.9 Per [name] Patient should add Mag OX 200 mg, one tab daily  rx sent into pharmacy and patients family aware

## 2015-05-28 ENCOUNTER — Emergency Department (HOSPITAL_COMMUNITY): Payer: Medicare Other

## 2015-05-28 ENCOUNTER — Encounter (HOSPITAL_COMMUNITY): Payer: Self-pay | Admitting: *Deleted

## 2015-05-28 ENCOUNTER — Telehealth: Payer: Self-pay | Admitting: Internal Medicine

## 2015-05-28 ENCOUNTER — Emergency Department (HOSPITAL_COMMUNITY)
Admission: EM | Admit: 2015-05-28 | Discharge: 2015-05-28 | Disposition: A | Discharge status: Home / Self Care | Payer: Medicare Other | Attending: Emergency Medicine | Admitting: Emergency Medicine

## 2015-05-28 DIAGNOSIS — I2510 Atherosclerotic heart disease of native coronary artery without angina pectoris: Secondary | ICD-10-CM | POA: Diagnosis not present

## 2015-05-28 DIAGNOSIS — Z8619 Personal history of other infectious and parasitic diseases: Secondary | ICD-10-CM | POA: Insufficient documentation

## 2015-05-28 DIAGNOSIS — Y831 Surgical operation with implant of artificial internal device as the cause of abnormal reaction of the patient, or of later complication, without mention of misadventure at the time of the procedure: Secondary | ICD-10-CM | POA: Insufficient documentation

## 2015-05-28 DIAGNOSIS — M1990 Unspecified osteoarthritis, unspecified site: Secondary | ICD-10-CM | POA: Diagnosis not present

## 2015-05-28 DIAGNOSIS — Z79899 Other long term (current) drug therapy: Secondary | ICD-10-CM | POA: Insufficient documentation

## 2015-05-28 DIAGNOSIS — I10 Essential (primary) hypertension: Secondary | ICD-10-CM | POA: Diagnosis not present

## 2015-05-28 DIAGNOSIS — Z8673 Personal history of transient ischemic attack (TIA), and cerebral infarction without residual deficits: Secondary | ICD-10-CM | POA: Diagnosis not present

## 2015-05-28 DIAGNOSIS — Z794 Long term (current) use of insulin: Secondary | ICD-10-CM | POA: Diagnosis not present

## 2015-05-28 DIAGNOSIS — D509 Iron deficiency anemia, unspecified: Secondary | ICD-10-CM | POA: Insufficient documentation

## 2015-05-28 DIAGNOSIS — E785 Hyperlipidemia, unspecified: Secondary | ICD-10-CM | POA: Diagnosis not present

## 2015-05-28 DIAGNOSIS — I5042 Chronic combined systolic (congestive) and diastolic (congestive) heart failure: Secondary | ICD-10-CM | POA: Insufficient documentation

## 2015-05-28 DIAGNOSIS — Z8781 Personal history of (healed) traumatic fracture: Secondary | ICD-10-CM | POA: Insufficient documentation

## 2015-05-28 DIAGNOSIS — Z452 Encounter for adjustment and management of vascular access device: Secondary | ICD-10-CM

## 2015-05-28 DIAGNOSIS — I509 Heart failure, unspecified: Secondary | ICD-10-CM

## 2015-05-28 DIAGNOSIS — E119 Type 2 diabetes mellitus without complications: Secondary | ICD-10-CM | POA: Insufficient documentation

## 2015-05-28 DIAGNOSIS — T82897A Other specified complication of cardiac prosthetic devices, implants and grafts, initial encounter: Secondary | ICD-10-CM | POA: Diagnosis present

## 2015-05-28 MED ORDER — MILRINONE IN DEXTROSE 20 MG/100ML IV SOLN
0.2500 ug/kg/min | INTRAVENOUS | Status: DC
  Administered 2015-05-28: 0.25 ug/kg/min via INTRAVENOUS
  Filled 2015-05-28: qty 100

## 2015-05-28 MED ORDER — LIDOCAINE HCL 1 % IJ SOLN
INTRAMUSCULAR | Status: AC
  Filled 2015-05-28: qty 20

## 2015-05-28 NOTE — Telephone Encounter (Signed)
Called the patient's daughter after finding out that she pulled her PICC Line out. She said that there was no bleeding and she is wondering if she can come to the hospital in the morning rather at this time. Since milrinone stays in the system for sometime, it was told to her that it is okay to come in 5 hours to the ED. She wanted to discuss this with Dr. Teressa Lower as well and was told to call after 8 AM so that she could speak with him. She is milrinone dependent and did not have any symptoms at the time of this call. It was told to her that if the patient has any new symptoms, she should go to the ED immediately.

## 2015-05-28 NOTE — Procedures (Signed)
RUE PICC 40 cm SVC RA No comp/ebl

## 2015-05-28 NOTE — ED Notes (Signed)
The pt had a picc line in her rt upper arm with heart med running in.  The picc came out one hour ago.  She has the med with her

## 2015-05-28 NOTE — ED Notes (Signed)
IR called reports she will be taken to IR within the hour.

## 2015-05-28 NOTE — ED Notes (Signed)
Pt being transported to IR for PICC line placement. Dr Gwendolyn Grant unable to get in touch with cards as of yet. Spoke with patient's daughter who is on the way. All pt's belongings sent with pt.

## 2015-05-28 NOTE — ED Provider Notes (Signed)
PICC line has been replaced.  milrinon infusion restarted.  Pt is ready to go home.  Pt and family are comfortable with plan.  Linwood Dibbles, MD 05/28/15 [phone]

## 2015-05-28 NOTE — ED Provider Notes (Signed)
CSN: [id number]     Arrival date & time 05/28/15  0159 History   This chart was scribed for Elwin Mocha, MD by Arlan Organ, ED Scribe. This patient was seen in room D30C/D30C and the patient's care was started 3:17 AM.   Chief Complaint  Patient presents with  . picc line out    Patient is a 81 y.o. female presenting with general illness. The history is provided by the patient and a relative. No language interpreter was used.  Illness Location:  R upper arm Severity:  Unable to specify Onset quality:  Unable to specify Duration:  2 hours Timing:  Constant Progression:  Unable to specify Context:  Pt PICC line came out 1 hour prior to arrival Relieved by:  Nothing tried Worsened by:  Nothing tried Ineffective treatments:  Nothing tried Associated symptoms: no abdominal pain, no chest pain, no cough, no fever, no headaches, no nausea, no shortness of breath and no vomiting     HPI Comments: ALIECE HONOLD is a 81 y.o. female with a PMHx of hyperlipidemia, LBBB, peripheral vascular disease, CVA, DM, and HTN who presents to the Emergency Department here after a PICC line issue. Per family, pts PICC line came out from her R upper arm approximately 1 hour ago. No recent fever, chills, shortness of breath, chest pain, abdominal pain, nausea, or vomiting. She is currently on Milrinone which was started 2 months ago. She denies any issues on this medication drip. Ms. Ahart is followed by Dr. Arvilla Meres. Pt with known allergy to Prednisone.  Past Medical History  Diagnosis Date  . Peripheral artery disease   . Hyperlipidemia        . Arthritis   . Aortic stenosis      a. s/p AVR  . BUNDLE BRANCH BLOCK, LEFT    . CAROTID ARTERY STENOSIS 01/05/2009  . CVA 03/14/2010  . PERIPHERAL VASCULAR DISEASE 07/30/2007  . Chronic combined systolic and diastolic CHF (congestive heart failure)     a. RHC 09/2011 RA 8, RV 58/2/11; PA 54/22 (37) PCWP 20; F CO/CI 4.57/2.67 PVR 3.7; b. L main no sig dz,  LAD luminal irreg; LCx lg Ramus with luminal irreg, small AV Lcx witout sig dz, RCA luminal irreg; severe mitral annular calcifications; AV calcified c. EF 45-50% (07/2013);  d. 11/2014 Echo: EF 20-25%, diff HK, nl AV/MV, sev dil LA, mod TR/PR, PASP .  . Diabetes mellitus   . HTN (hypertension)   . Iron deficiency anemia 09/21/2011  . History of shingles   . Mitral regurgitation     a. s/p MVR  . S/P aortic valve replacement with bioprosthetic valve 06/15/2013    21 mm [facility] Ease bovine pericardial tissue valve  . S/P mitral valve replacement with bioprosthetic valve 06/15/2013    25 mm [facility] Mitral bovine pericardial tissue valve  . Fracture, fibula, shaft 01/30/2015    right   Past Surgical History  Procedure Laterality Date  . Carotid endarterectomy Left 2011  . Polypectomy  12/27/04  . Cataract extraction Bilateral 2009  . Tee without cardioversion N/A 05/19/2013    Procedure: TRANSESOPHAGEAL ECHOCARDIOGRAM (TEE);  Surgeon: Laurey Morale, MD;  Location: [facility] ENDOSCOPY;  Service: Cardiovascular;  Laterality: N/A;  . Colonoscopy    . Cardiac catheterization  2012  . Aortic valve replacement N/A 06/15/2013    Procedure: AORTIC VALVE REPLACEMENT (AVR);  Surgeon: Purcell Nails, MD;  Location: [facility] OR;  Service: Open Heart Surgery;  Laterality: N/A;  .  Intraoperative transesophageal echocardiogram N/A 06/15/2013    Procedure: INTRAOPERATIVE TRANSESOPHAGEAL ECHOCARDIOGRAM;  Surgeon: Purcell Nails, MD;  Location: [facility] OR;  Service: Open Heart Surgery;  Laterality: N/A;  . Mitral valve replacement N/A 06/15/2013    Procedure: MITRAL VALVE (MV) REPLACEMENT;  Surgeon: Purcell Nails, MD;  Location: [facility] OR;  Service: Open Heart Surgery;  Laterality: N/A;  . Epicardial pacing lead placement N/A 06/15/2013    Procedure: EPICARDIAL PACING LEAD PLACEMENT;  Surgeon: Purcell Nails, MD;  Location: [facility] OR;  Service: Open Heart Surgery;  Laterality: N/A;  . Left and right heart  catheterization with coronary angiogram N/A 09/19/2011    Procedure: LEFT AND RIGHT HEART CATHETERIZATION WITH CORONARY ANGIOGRAM;  Surgeon: Dolores Patty, MD;  Location: [facility] CATH LAB;  Service: Cardiovascular;  Laterality: N/A;  . Orif ankle fracture Right 01/31/2015    Procedure: OPEN REDUCTION INTERNAL FIXATION (ORIF) ANKLE FRACTURE;  Surgeon: Eldred Manges, MD;  Location: [facility] OR;  Service: Orthopedics;  Laterality: Right;  . Ep implantable device N/A 03/21/2015    Procedure: BiV Pacemaker Insertion CRT-P;  Surgeon: Duke Salvia, MD;  Location: [facility] INVASIVE CV LAB;  Service: Cardiovascular;  Laterality: N/A;  . Pacemaker insertion     Family History  Problem Relation Age of Onset  . Colon cancer Mother   . Diabetes Neg Hx   . Coronary artery disease Neg Hx    History  Substance Use Topics  . Smoking status: Never Smoker   . Smokeless tobacco: Never Used  . Alcohol Use: No   OB History    No data available     Review of Systems  Constitutional: Negative for fever and chills.  Respiratory: Negative for cough and shortness of breath.   Cardiovascular: Negative for chest pain.  Gastrointestinal: Negative for nausea, vomiting and abdominal pain.  Musculoskeletal: Negative for back pain.  Neurological: Negative for headaches.  Psychiatric/Behavioral: Negative for confusion.  All other systems reviewed and are negative.     Allergies  Other and Prednisone  Home Medications   Prior to Admission medications   Medication Sig Start Date End Date Taking? Authorizing Provider  amiodarone (PACERONE) 200 MG tablet Take 1 tablet (200 mg total) by mouth daily. 05/17/15   Dolores Patty, MD  apixaban (ELIQUIS) 2.5 MG TABS tablet Take 1 tablet (2.5 mg total) by mouth 2 (two) times daily. 03/30/15   Graciella Freer, PA-C  calcium citrate-vitamin D (CITRACAL+D) 315-200 MG-UNIT per tablet Take 1 tablet by mouth daily.    Historical Provider, MD  docusate sodium (COLACE) 100 MG  capsule Take 100 mg by mouth 2 (two) times daily.      Historical Provider, MD  ferrous sulfate 325 (65 FE) MG tablet Take 1 tablet (325 mg total) by mouth 2 (two) times daily with a meal. 02/28/14   Etta Grandchild, MD  furosemide (LASIX) 80 MG tablet Take 1 tablet (80 mg total) by mouth daily. 04/18/15   Laurey Morale, MD  hydrALAZINE (APRESOLINE) 10 MG tablet Take 1 tablet (10 mg total) by mouth every 8 (eight) hours. 03/30/15   Graciella Freer, PA-C  insulin aspart (NOVOLOG) 100 UNIT/ML injection Inject 0-9 Units into the skin 3 (three) times daily with meals. Sliding scale CBG 70 - 120: 0 units CBG 121 - 150: 1 unit,  CBG 151 - 200: 2 units,  CBG 201 - 250: 3 units,  CBG 251 - 300: 5 units,  CBG 301 - 350: 7  units,  CBG 351 - 400: 9 units   CBG > 400: 9 units and notify your MD 04/09/15   Ripudeep Jenna Luo, MD  Insulin Pen Needle (NOVOFINE) 32G X 6 MM MISC Use daily with insulin 12/29/14   Etta Grandchild, MD  isosorbide mononitrate (IMDUR) 30 MG 24 hr tablet Take 0.5 tablets (15 mg total) by mouth daily. 03/30/15   Graciella Freer, PA-C  Magnesium Oxide 200 MG TABS Take 1 tablet (200 mg total) by mouth daily. 05/25/15   Graciella Freer, PA-C  metolazone (ZAROXOLYN) 5 MG tablet Take 1 tablet (5 mg total) by mouth once a week. On Fridays, with extra 20 meq potassium tablet. 05/17/15   Dolores Patty, MD  milrinone (PRIMACOR) 20 MG/100ML SOLN infusion Inject 15.475 mcg/min into the vein continuous. 04/05/15   Graciella Freer, PA-C  potassium chloride SA (K-DUR,KLOR-CON) 20 MEQ tablet Take 2 tablets (40 mEq total) by mouth 2 (two) times daily. 04/09/15   Ripudeep Jenna Luo, MD   Triage Vitals: BP 118/66 mmHg  Pulse 74  Temp(Src) 98.1 F (36.7 C)  Resp 20  Ht  (1.575 m)  Wt 127 lb (57.607 kg)  BMI 23.22 kg/m2  SpO2 98%   Physical Exam  Constitutional: She is oriented to person, place, and time. She appears well-developed and well-nourished. No distress.  HENT:  Head:  Normocephalic and atraumatic.  Mouth/Throat: Oropharynx is clear and moist.  Eyes: EOM are normal. Pupils are equal, round, and reactive to light.  Neck: Normal range of motion. Neck supple.  Cardiovascular: Normal rate and regular rhythm.  Exam reveals no friction rub.   No murmur heard. Pulmonary/Chest: Effort normal and breath sounds normal. No respiratory distress. She has no wheezes. She has no rales.  Abdominal: Soft. She exhibits no distension. There is no tenderness. There is no rebound.  Musculoskeletal: Normal range of motion. She exhibits no edema.  Neurological: She is alert and oriented to person, place, and time.  Skin: She is not diaphoretic.  Psychiatric: She has a normal mood and affect.  Nursing note and vitals reviewed.   ED Course  Procedures (including critical care time)  DIAGNOSTIC STUDIES: Oxygen Saturation is 98% on RA, Normal by my interpretation.    COORDINATION OF CARE: 3:17 AM-Discussed treatment plan with pt at bedside and pt agreed to plan.     Labs Review Labs Reviewed - No data to display  Imaging Review Ir US Guide Vasc Access Right  05/28/2015   CLINICAL DATA:  Aortic valve replacement.  Mitral regurgitation.  EXAM: RIGHT UPPER EXTREMITY PICC LINE PLACEMENT WITH ULTRASOUND AND FLUOROSCOPIC GUIDANCE  FLUOROSCOPY TIME:  6 seconds.  PROCEDURE: The patient was advised of the possible risks andcomplications and agreed to undergo the procedure. The patient was then brought to the angiographic suite for the procedure.  The right arm was prepped with chlorhexidine, drapedin the usual sterile fashion using maximum barrier technique (cap and mask, sterile gown, sterile gloves, large sterile sheet, hand hygiene and cutaneous antisepsis) and infiltrated locally with 1% Lidocaine.  Ultrasound demonstrated patency of the right basilic vein, and this was documented with an image. Under real-time ultrasound guidance, this vein was accessed with a 21 gauge micropuncture  needle and image documentation was performed. A 0.018 wire was introduced in to the vein. Over this, a 5 [place] single lumen power PICC was advanced to the lower SVC/right atrial junction. Fluoroscopy during the procedure and fluoro spot radiograph confirms appropriate catheter position.  The catheter was flushed and covered with asterile dressing.  Catheter length: 40 cm  COMPLICATIONS: None  IMPRESSION: Successful right arm power PICC line placement with ultrasound and fluoroscopic guidance. The catheter is ready for use.   Electronically Signed   By: Jolaine Click M.D.   On: 05/28/2015 13:17   Ir Fluoro Guide Cv Midline Picc Right  05/28/2015   CLINICAL DATA:  Aortic valve replacement.  Mitral regurgitation.  EXAM: RIGHT UPPER EXTREMITY PICC LINE PLACEMENT WITH ULTRASOUND AND FLUOROSCOPIC GUIDANCE  FLUOROSCOPY TIME:  6 seconds.  PROCEDURE: The patient was advised of the possible risks andcomplications and agreed to undergo the procedure. The patient was then brought to the angiographic suite for the procedure.  The right arm was prepped with chlorhexidine, drapedin the usual sterile fashion using maximum barrier technique (cap and mask, sterile gown, sterile gloves, large sterile sheet, hand hygiene and cutaneous antisepsis) and infiltrated locally with 1% Lidocaine.  Ultrasound demonstrated patency of the right basilic vein, and this was documented with an image. Under real-time ultrasound guidance, this vein was accessed with a 21 gauge micropuncture needle and image documentation was performed. A 0.018 wire was introduced in to the vein. Over this, a 5 [place] single lumen power PICC was advanced to the lower SVC/right atrial junction. Fluoroscopy during the procedure and fluoro spot radiograph confirms appropriate catheter position. The catheter was flushed and covered with asterile dressing.  Catheter length: 40 cm  COMPLICATIONS: None  IMPRESSION: Successful right arm power PICC line placement with  ultrasound and fluoroscopic guidance. The catheter is ready for use.   Electronically Signed   By: Jolaine Click M.D.   On: 05/28/2015 13:17     EKG Interpretation None      MDM   Final diagnoses:  CHF (congestive heart failure)    89F here with a dislodged PICC line. She is on milrinone drip through this PICC for CHF. IV started and milrinone restarted. IR consulted for PICC placement. Cards consulted to have her milrinone restarted.  I personally performed the services described in this documentation, which was scribed in my presence. The recorded information has been reviewed and is accurate.     Elwin Mocha, MD 05/29/15 [phone]

## 2015-05-28 NOTE — ED Notes (Signed)
Family at bedside assisting pt with getting dressed. Family informed to ask for assistance if needed.

## 2015-05-29 ENCOUNTER — Telehealth (HOSPITAL_COMMUNITY): Payer: Self-pay | Admitting: *Deleted

## 2015-05-29 NOTE — Telephone Encounter (Signed)
Received labs from [facility] drawn on 8/8: Bun 68  CR 1.91 K 3.3 Mag 2.3  Called and spoke w/pt's daughter [name], she states she has not picked up rx for mag-ox yet so pt has not started and she has only been giving pt KCL 20 bid not 40 bid as prescribed.  Per Cic[place] Duck pharmD have pt increase KCL to 40 meq bid and do not start mag-ox, daughter aware and agreeable

## 2015-05-31 ENCOUNTER — Telehealth: Payer: Self-pay | Admitting: Internal Medicine

## 2015-05-31 DIAGNOSIS — E1021 Type 1 diabetes mellitus with diabetic nephropathy: Secondary | ICD-10-CM

## 2015-05-31 MED ORDER — INSULIN LISPRO 100 UNIT/ML ~~LOC~~ SOLN: 0.0000 [IU] | Freq: Three times a day (TID) | SUBCUTANEOUS | Status: DC

## 2015-05-31 NOTE — Telephone Encounter (Signed)
P/t's daughter informed

## 2015-05-31 NOTE — Telephone Encounter (Signed)
Rx for humalog sent to [facility]

## 2015-05-31 NOTE — Telephone Encounter (Signed)
Pt's daughter called stating insurance doesn't cover insulin aspart (NOVOLOG) 100 UNIT/ML injection [[id number]]   And they will cover Humalog instead Pharmacy is [facility] Please advise

## 2015-06-04 NOTE — Telephone Encounter (Signed)
Patient's daughter called back in.  States Dr. Yetta Barre got a script for insulin that pharmacy covers sent over to [facility].  Daughter is requesting pharmacy to be called.  States Dr. Yetta Barre sent script over for 3 injections a day but when patient was in Rehab she was taking 4 injections a day.  Pharmacy is not sure what to fill.  Is also requesting a pen instead of needle injection.  She is also requesting needles for the pen to be sent over as well.

## 2015-06-05 ENCOUNTER — Other Ambulatory Visit: Payer: Self-pay | Admitting: Internal Medicine

## 2015-06-05 ENCOUNTER — Telehealth (HOSPITAL_COMMUNITY): Payer: Self-pay | Admitting: *Deleted

## 2015-06-05 DIAGNOSIS — E0821 Diabetes mellitus due to underlying condition with diabetic nephropathy: Secondary | ICD-10-CM

## 2015-06-05 MED ORDER — INSULIN LISPRO 200 UNIT/ML ~~LOC~~ SOPN: 0.0000 [IU] | PEN_INJECTOR | Freq: Four times a day (QID) | SUBCUTANEOUS | Status: DC | PRN

## 2015-06-05 MED ORDER — POTASSIUM CHLORIDE CRYS ER 20 MEQ PO TBCR: EXTENDED_RELEASE_TABLET | ORAL | Status: DC

## 2015-06-05 MED ORDER — INSULIN PEN NEEDLE 32G X 6 MM MISC: 1.0000 | Freq: Every day | Status: DC

## 2015-06-05 NOTE — Telephone Encounter (Signed)
LMOVM

## 2015-06-05 NOTE — Telephone Encounter (Signed)
Prescriptions have been changed as requested.

## 2015-06-05 NOTE — Telephone Encounter (Signed)
Please advise 

## 2015-06-05 NOTE — Telephone Encounter (Signed)
Received labs from [facility] drawn on 8/15: Bun 59 CR 2.02 K 5.2  Mag 2.3  Per Otilio Saber, PA decrease KCL to 40 meq in AM and 20 meq in PM, no changes to diuretics at this time.  Spoke w/pt's daughter, [name], she is aware and agreeable, she will make med changes, she states pt is doing well, no edema and wt is stable, pt will have repeat labs next week w/[facility]

## 2015-06-07 ENCOUNTER — Encounter (HOSPITAL_COMMUNITY): Payer: Self-pay

## 2015-06-07 NOTE — Progress Notes (Signed)
LATE ENTRY Patient's daughter called [facility] yesterday to report blood around PICC site. Heather RN who took call had [facility] RN come to home to assess line. Daughter left VM yesterday afternoon to report nurse assessed line and said "it's not bleeding much but needs to be looked at". No report called in from the nurse, and message from daughter vague as to what needs to be assessed/looked at/done for the blood at the PICC site. Attempted to call Joanna [name], no answer. Will try to call again today to see if she can find contact info for the nurse who assessed the line yesterday, and/or if she can guide [place] further as to how to follow up with this situation.  Joanna [name]

## 2015-06-08 ENCOUNTER — Other Ambulatory Visit (HOSPITAL_COMMUNITY): Payer: Self-pay | Admitting: Internal Medicine

## 2015-06-08 ENCOUNTER — Telehealth (HOSPITAL_COMMUNITY): Payer: Self-pay | Admitting: *Deleted

## 2015-06-08 NOTE — Telephone Encounter (Signed)
Pts daughter called and said her PICC line was still bleeding last night. It was replaced a week ago home health cleaned the site yesterday. She was advised by home health to call our clinic. Heather spoke with Jeri Modena with [facility] she stated she would call pts home health nurse directly for her to address the site. Pt is on eliquis so they expect a little bleeding from site seeing as it was just replaced but will address it.

## 2015-06-13 ENCOUNTER — Encounter: Payer: Self-pay | Admitting: Internal Medicine

## 2015-06-15 ENCOUNTER — Other Ambulatory Visit (HOSPITAL_COMMUNITY): Payer: Self-pay | Admitting: *Deleted

## 2015-06-15 MED ORDER — ISOSORBIDE MONONITRATE ER 30 MG PO TB24: 15.0000 mg | ORAL_TABLET | Freq: Every day | ORAL | Status: DC

## 2015-06-15 MED ORDER — HYDRALAZINE HCL 10 MG PO TABS: 10.0000 mg | ORAL_TABLET | Freq: Three times a day (TID) | ORAL | Status: DC

## 2015-06-15 MED ORDER — AMIODARONE HCL 200 MG PO TABS: 200.0000 mg | ORAL_TABLET | Freq: Every day | ORAL | Status: DC

## 2015-06-18 ENCOUNTER — Other Ambulatory Visit (HOSPITAL_COMMUNITY): Payer: Self-pay | Admitting: *Deleted

## 2015-06-19 ENCOUNTER — Other Ambulatory Visit (HOSPITAL_COMMUNITY): Payer: Self-pay | Admitting: *Deleted

## 2015-06-19 MED ORDER — APIXABAN 2.5 MG PO TABS: 2.5000 mg | ORAL_TABLET | Freq: Two times a day (BID) | ORAL | Status: DC

## 2015-06-19 MED ORDER — HYDRALAZINE HCL 10 MG PO TABS: 10.0000 mg | ORAL_TABLET | Freq: Three times a day (TID) | ORAL | Status: DC

## 2015-06-19 MED ORDER — AMIODARONE HCL 200 MG PO TABS: 200.0000 mg | ORAL_TABLET | Freq: Every day | ORAL | Status: DC

## 2015-06-21 ENCOUNTER — Ambulatory Visit (INDEPENDENT_AMBULATORY_CARE_PROVIDER_SITE_OTHER): Payer: Medicare Other | Admitting: Internal Medicine

## 2015-06-21 ENCOUNTER — Other Ambulatory Visit (HOSPITAL_COMMUNITY): Payer: Self-pay | Admitting: *Deleted

## 2015-06-21 VITALS — BP 110/70 | HR 87

## 2015-06-21 DIAGNOSIS — Z45018 Encounter for adjustment and management of other part of cardiac pacemaker: Secondary | ICD-10-CM | POA: Diagnosis not present

## 2015-06-21 DIAGNOSIS — I5022 Chronic systolic (congestive) heart failure: Secondary | ICD-10-CM

## 2015-06-21 DIAGNOSIS — I6523 Occlusion and stenosis of bilateral carotid arteries: Secondary | ICD-10-CM

## 2015-06-21 MED ORDER — APIXABAN 2.5 MG PO TABS: 2.5000 mg | ORAL_TABLET | Freq: Two times a day (BID) | ORAL | Status: DC

## 2015-06-21 NOTE — Progress Notes (Signed)
Patient Care Team: Etta Grandchild, MD as PCP - General (Internal Medicine) Laurey Morale, MD as Consulting Physician (Cardiology) Dolores Patty, MD as Consulting Physician (Cardiology) Purcell Nails, MD as Consulting Physician (Cardiothoracic Surgery)   HPI  Joanna [name] is a 81 y.o. female Seen in follow-up for CRT-D implantation 6/16 utilizing a previously implanted epicardial LV lead.  She has a history of aortic stenosis and underwent AVR/MVR area she has nonischemic cardiomyopathy and left bundle branch block. Ejection fraction 4/16 was 20-25%  Because of the lack of responsiveness, she was put on milrinone and remains on it to this point.  Dyspnea is modest. There is trace edema. Records and Results Reviewed heart failure records  Past Medical History  Diagnosis Date  . Peripheral artery disease   . Hyperlipidemia        . Arthritis   . Aortic stenosis      a. s/p AVR  . BUNDLE BRANCH BLOCK, LEFT    . CAROTID ARTERY STENOSIS 01/05/2009  . CVA 03/14/2010  . PERIPHERAL VASCULAR DISEASE 07/30/2007  . Chronic combined systolic and diastolic CHF (congestive heart failure)     a. RHC 09/2011 RA 8, RV 58/2/11; PA 54/22 (37) PCWP 20; F CO/CI 4.57/2.67 PVR 3.7; b. L main no sig dz, LAD luminal irreg; LCx lg Ramus with luminal irreg, small AV Lcx witout sig dz, RCA luminal irreg; severe mitral annular calcifications; AV calcified c. EF 45-50% (07/2013);  d. 11/2014 Echo: EF 20-25%, diff HK, nl AV/MV, sev dil LA, mod TR/PR, PASP .  . Diabetes mellitus   . HTN (hypertension)   . Iron deficiency anemia 09/21/2011  . History of shingles   . Mitral regurgitation     a. s/p MVR  . S/P aortic valve replacement with bioprosthetic valve 06/15/2013    21 mm [facility] Ease bovine pericardial tissue valve  . S/P mitral valve replacement with bioprosthetic valve 06/15/2013    25 mm [facility] Mitral bovine pericardial tissue valve  . Fracture, fibula, shaft  01/30/2015    right    Past Surgical History  Procedure Laterality Date  . Carotid endarterectomy Left 2011  . Polypectomy  12/27/04  . Cataract extraction Bilateral 2009  . Tee without cardioversion N/A 05/19/2013    Procedure: TRANSESOPHAGEAL ECHOCARDIOGRAM (TEE);  Surgeon: Laurey Morale, MD;  Location: [facility] ENDOSCOPY;  Service: Cardiovascular;  Laterality: N/A;  . Colonoscopy    . Cardiac catheterization  2012  . Aortic valve replacement N/A 06/15/2013    Procedure: AORTIC VALVE REPLACEMENT (AVR);  Surgeon: Purcell Nails, MD;  Location: [facility] OR;  Service: Open Heart Surgery;  Laterality: N/A;  . Intraoperative transesophageal echocardiogram N/A 06/15/2013    Procedure: INTRAOPERATIVE TRANSESOPHAGEAL ECHOCARDIOGRAM;  Surgeon: Purcell Nails, MD;  Location: [facility] OR;  Service: Open Heart Surgery;  Laterality: N/A;  . Mitral valve replacement N/A 06/15/2013    Procedure: MITRAL VALVE (MV) REPLACEMENT;  Surgeon: Purcell Nails, MD;  Location: [facility] OR;  Service: Open Heart Surgery;  Laterality: N/A;  . Epicardial pacing lead placement N/A 06/15/2013    Procedure: EPICARDIAL PACING LEAD PLACEMENT;  Surgeon: Purcell Nails, MD;  Location: [facility] OR;  Service: Open Heart Surgery;  Laterality: N/A;  . Left and right heart catheterization with coronary angiogram N/A 09/19/2011    Procedure: LEFT AND RIGHT HEART CATHETERIZATION WITH CORONARY ANGIOGRAM;  Surgeon: Dolores Patty, MD;  Location: [facility] CATH LAB;  Service: Cardiovascular;  Laterality: N/A;  . Orif ankle fracture Right 01/31/2015    Procedure: OPEN REDUCTION INTERNAL FIXATION (ORIF) ANKLE FRACTURE;  Surgeon: Eldred Manges, MD;  Location: [facility] OR;  Service: Orthopedics;  Laterality: Right;  . Ep implantable device N/A 03/21/2015    Procedure: BiV Pacemaker Insertion CRT-P;  Surgeon: Duke Salvia, MD;  Location: [facility] INVASIVE CV LAB;  Service: Cardiovascular;  Laterality: N/A;  . Pacemaker insertion      Current Outpatient Prescriptions  Medication Sig  Dispense Refill  . acetaminophen (TYLENOL) 500 MG tablet Take 500 mg by mouth every 6 (six) hours as needed.    Marland Kitchen amiodarone (PACERONE) 200 MG tablet Take 1 tablet (200 mg total) by mouth daily. 90 tablet 3  . calcium citrate-vitamin D (CITRACAL+D) 315-200 MG-UNIT per tablet Take 1 tablet by mouth daily.    Marland Kitchen docusate sodium (COLACE) 100 MG capsule Take 100 mg by mouth 2 (two) times daily.      . ferrous sulfate 325 (65 FE) MG tablet Take 1 tablet (325 mg total) by mouth 2 (two) times daily with a meal. 180 tablet 1  . furosemide (LASIX) 80 MG tablet Take 1 tablet (80 mg total) by mouth daily. 30 tablet 6  . hydrALAZINE (APRESOLINE) 10 MG tablet Take 1 tablet (10 mg total) by mouth every 8 (eight) hours. 360 tablet 3  . Insulin Lispro, Human, (HUMALOG KWIKPEN) 200 UNIT/ML SOPN Inject 0-9 Units into the skin 4 (four) times daily as needed. 9 mL 3  . Insulin Pen Needle (NOVOFINE) 32G X 6 MM MISC Use daily with insulin 300 each 3  . Insulin Pen Needle (NOVOFINE) 32G X 6 MM MISC 1 Act by Does not apply route daily. USE QID 400 each 3  . isosorbide mononitrate (IMDUR) 30 MG 24 hr tablet Take 0.5 tablets (15 mg total) by mouth daily. 30 tablet 6  . metolazone (ZAROXOLYN) 5 MG tablet Take 1 tablet (5 mg total) by mouth once a week. On Fridays, with extra 20 meq potassium tablet. 6 tablet 6  . milrinone (PRIMACOR) 20 MG/100ML SOLN infusion Inject 15.475 mcg/min into the vein continuous. 100 mL 6  . NOVOLOG FLEXPEN 100 UNIT/ML FlexPen Inject into the skin as directed. Per sliding scale    . potassium chloride SA (K-DUR,KLOR-CON) 20 MEQ tablet Take 1 1/2 tablets by mouth in the AM and take 1 tablet by mouth in the PM    . apixaban (ELIQUIS) 2.5 MG TABS tablet Take 1 tablet (2.5 mg total) by mouth 2 (two) times daily. 180 tablet 3   No current facility-administered medications for this visit.    Allergies  Allergen Reactions  . Other Hives    STEROIDS  . Prednisone Hives and Other (See Comments)     She is allergic to all steroids!      Review of Systems negative except from HPI and PMH  Physical Exam BP 110/70 mmHg  Pulse 87 Well developed and well nourished in no acute distress HENT normal E scleral and icterus clear Neck Supple JVP flat; carotids brisk and full Clear to ausculation  Device pocket well healed; without hematoma or erythema.  There is no tethering *Regular rate and rhythm, no murmurs gallops or rub Soft with active bowel sounds No clubbing cyanosis 1+ Edema Alert and oriented, grossly normal motor and sensory function Skin Warm and Dry  ECG demonstrates P cc pacing is 87 with an up right QRS in lead V1 and V2  Assessment and  Plan  Congestive heart failure  Nonischemic cardiomyopathy  Milrinone dependent   Renal insufficiency-hypokalemia   CRT-D-St. Jude's  We will plan to them. They shorten her AV delay to see if we can't improve functional status. In the event that there is no change, we will consider AV optimization echo.  I wonder if this juncture there is at the possibility to further wean her off her milrinone.  Review of her laboratories demonstrated a creatinine of 2.08 June the cath was 3.4. We will recheck them today.

## 2015-06-21 NOTE — Patient Instructions (Signed)
Medication Instructions:  Your physician recommends that you continue on your current medications as directed. Please refer to the Current Medication list given to you today.   Labwork: Handwritten prescription given to you to have labs drawn on Monday when [facility] draws blood.  Labs   Testing/Procedures: None ordered  Follow-Up: Remote monitoring is used to monitor your Pacemaker of ICD from home. This monitoring reduces the number of office visits required to check your device to one time per year. It allows [place] to keep an eye on the functioning of your device to ensure it is working properly. You are scheduled for a device check from home on 09/20/15. You may send your transmission at any time that day. If you have a wireless device, the transmission will be sent automatically. After your physician reviews your transmission, you will receive a postcard with your next transmission date.  Your physician wants you to follow-up in: 9 months with Dr. Graciela Husbands.  You will receive a reminder letter in the mail two months in advance. If you don't receive a letter, please call our office to schedule the follow-up appointment.  Any Other Special Instructions Will Be Listed Below (If Applicable). Thank you for choosing [facility]!!   Dory Horn, RN [phone]

## 2015-06-22 ENCOUNTER — Other Ambulatory Visit (HOSPITAL_COMMUNITY): Payer: Self-pay | Admitting: Internal Medicine

## 2015-06-22 LAB — CUP PACEART INCLINIC DEVICE CHECK
Brady Statistic RA Percent Paced: 1 %
Brady Statistic RV Percent Paced: 99 %
Date Time Interrogation Session: 20160902175433
Lead Channel Pacing Threshold Amplitude: 0.75 V
Lead Channel Pacing Threshold Amplitude: 0.875 V
Lead Channel Pacing Threshold Amplitude: 1 V
Lead Channel Pacing Threshold Pulse Width: 0.4 ms
Lead Channel Pacing Threshold Pulse Width: 0.5 ms
Lead Channel Pacing Threshold Pulse Width: 0.5 ms
Lead Channel Sensing Intrinsic Amplitude: 12 mV
Lead Channel Sensing Intrinsic Amplitude: 3.6 mV
Lead Channel Setting Pacing Amplitude: 2 V
Lead Channel Setting Pacing Amplitude: 2 V
Lead Channel Setting Pacing Amplitude: 2 V
Lead Channel Setting Pacing Pulse Width: 0.5 ms
Lead Channel Setting Pacing Pulse Width: 0.5 ms
Pulse Gen Model: 3242
Pulse Gen Serial Number: 7705940

## 2015-06-26 ENCOUNTER — Telehealth: Payer: Self-pay

## 2015-06-26 NOTE — Telephone Encounter (Signed)
Prior auth for Eliquis 2.5mg  sent to [facility] via fax.

## 2015-06-28 ENCOUNTER — Ambulatory Visit (HOSPITAL_COMMUNITY)
Admission: RE | Admit: 2015-06-28 | Discharge: 2015-06-28 | Disposition: A | Discharge status: Home / Self Care | Payer: Medicare Other | Source: Ambulatory Visit | Attending: Internal Medicine | Admitting: Internal Medicine

## 2015-06-28 VITALS — BP 118/64 | HR 86 | Wt 129.5 lb

## 2015-06-28 DIAGNOSIS — Z79899 Other long term (current) drug therapy: Secondary | ICD-10-CM | POA: Diagnosis not present

## 2015-06-28 DIAGNOSIS — I129 Hypertensive chronic kidney disease with stage 1 through stage 4 chronic kidney disease, or unspecified chronic kidney disease: Secondary | ICD-10-CM | POA: Insufficient documentation

## 2015-06-28 DIAGNOSIS — E785 Hyperlipidemia, unspecified: Secondary | ICD-10-CM | POA: Insufficient documentation

## 2015-06-28 DIAGNOSIS — I428 Other cardiomyopathies: Secondary | ICD-10-CM | POA: Insufficient documentation

## 2015-06-28 DIAGNOSIS — I6521 Occlusion and stenosis of right carotid artery: Secondary | ICD-10-CM | POA: Insufficient documentation

## 2015-06-28 DIAGNOSIS — I739 Peripheral vascular disease, unspecified: Secondary | ICD-10-CM | POA: Insufficient documentation

## 2015-06-28 DIAGNOSIS — N184 Chronic kidney disease, stage 4 (severe): Secondary | ICD-10-CM

## 2015-06-28 DIAGNOSIS — N189 Chronic kidney disease, unspecified: Secondary | ICD-10-CM | POA: Diagnosis not present

## 2015-06-28 DIAGNOSIS — E1122 Type 2 diabetes mellitus with diabetic chronic kidney disease: Secondary | ICD-10-CM | POA: Insufficient documentation

## 2015-06-28 DIAGNOSIS — Z953 Presence of xenogenic heart valve: Secondary | ICD-10-CM | POA: Diagnosis not present

## 2015-06-28 DIAGNOSIS — Z7902 Long term (current) use of antithrombotics/antiplatelets: Secondary | ICD-10-CM | POA: Insufficient documentation

## 2015-06-28 DIAGNOSIS — Z794 Long term (current) use of insulin: Secondary | ICD-10-CM | POA: Diagnosis not present

## 2015-06-28 DIAGNOSIS — I480 Paroxysmal atrial fibrillation: Secondary | ICD-10-CM | POA: Insufficient documentation

## 2015-06-28 DIAGNOSIS — I5022 Chronic systolic (congestive) heart failure: Secondary | ICD-10-CM | POA: Insufficient documentation

## 2015-06-28 MED ORDER — AMIODARONE HCL 200 MG PO TABS: 200.0000 mg | ORAL_TABLET | Freq: Every day | ORAL | Status: AC

## 2015-06-28 NOTE — Progress Notes (Signed)
ADVANCED HF CLINIC NOTE  Patient ID: Joanna [name], female   DOB: [birth year], 81 y.o.   MRN: [id number]  PCP: Dr Yetta Barre ([facility] [place]) CVTS Surgeon: Dr. Cornelius Moras Primary Cardiologist: Dr. Gala Romney DNR/DNI   HPI: Joanna [name] is a 81 year old woman with aortic stenosis s/p AVR/mitral valve replacement (06/15/2013), systolic HF due to NICM, hypertension, diabetes mellitus type II, hyperlipidemia, and peripheral artery disease.  Underwent AVR/MVR with placement of epicardial lead on 06/15/13 with PAF post-op terminated with amiodarone.  Upgraded to [facility] Jude CRT-D 03/22/2015.    Admitted from 5/27 through 03/30/2015 with acute/chronic systolic HF and cardiogenic shock. She was diuresed with IV lasix and milrinone. Unable to wean milrinone and discharged on 0.25 mcg. While hospitalized CRT-D placed on 6/2. She was discharged to [facility].   Readmitted 6/11 with hypoglycemia. Also had sepsis with HCAP and received antibiotic course. She remained on milrinone 0.375 mcg. Discharge weight was 128 pounds.   She presents today for follow up. At last visit wanted to come down on amiodarone but [facility] did not change. She has been at home with her daughter for about a month. She down 3 lbs from last visit.  She continues on milrinone 0.25 mcg via PICC. Overall feeling pretty good. Walking with cane.  Has mild DOE. She's ok getting around the house but seems a little winded getting from the house to the car for appointments. Weight at home 128-133. Takes metolazone for weights greater than 131. Only needed metolazone twice in the past month. Has some orthopnea, sleeps on two pillows. Has LBP that is relieved by Tylenol.   05/19/2013 ECHO EF 35% severe AS (low gradient severe AS confirmed by DSE), gradient: 32 mm Hg (S). Peak gradient: 57mm Hg  08/18/13 ECHO EF 45-50% AV working well 2/16 ECHO with EF 20-25%, moderately dilated LV with mild LVH, normal bioprosthetic mitral and aortic valves, moderate TR, PA systolic  pressure 54 mmHg.  4/16 ECHO EF 20-25%, diffuse hypokinesis, mild LVH, bioprosthetic aortic valve functioning normally, bioprosthetic mitral valve with mild central MR and mean gradient 4 mmHg.   Labs 9/14: K 5.4, creatinine 1.69 09/2013: K+ 4.6, creatinine 1.08, pro-BNP 11643 10/17/13: K+ 4.4, creatinine 1.6 11/28/14: K+ 4.1, Creatinine 1.6 12/15/14: K 3.5, creatinine 1.72 4/16: K 4.2, creatinine 1.86, hgb 8.8 5/16: K 4 => 3.1, creatinine 1.68 => 1.61, BNP 4039 => 4185 04/09/2015: K 3.4 Creatinine 2.01  04/16/2915: K 4.2 Creatinine 1.8 Hgb 10.3  06/11/2015: K 4.1 Creatinine 1.9  ROS:  12-point review of systems went over in clinic and all negative except as described in HPI and problem list.  Past Medical History  Diagnosis Date  . Peripheral artery disease   . Hyperlipidemia        . Arthritis   . Aortic stenosis      a. s/p AVR  . BUNDLE BRANCH BLOCK, LEFT    . CAROTID ARTERY STENOSIS 01/05/2009  . CVA 03/14/2010  . PERIPHERAL VASCULAR DISEASE 07/30/2007  . Chronic combined systolic and diastolic CHF (congestive heart failure)     a. RHC 09/2011 RA 8, RV 58/2/11; PA 54/22 (37) PCWP 20; F CO/CI 4.57/2.67 PVR 3.7; b. L main no sig dz, LAD luminal irreg; LCx lg Ramus with luminal irreg, small AV Lcx witout sig dz, RCA luminal irreg; severe mitral annular calcifications; AV calcified c. EF 45-50% (07/2013);  d. 11/2014 Echo: EF 20-25%, diff HK, nl AV/MV, sev dil LA, mod TR/PR, PASP .  . Diabetes mellitus   .  HTN (hypertension)   . Iron deficiency anemia 09/21/2011  . History of shingles   . Mitral regurgitation     a. s/p MVR  . S/P aortic valve replacement with bioprosthetic valve 06/15/2013    21 mm [facility] Ease bovine pericardial tissue valve  . S/P mitral valve replacement with bioprosthetic valve 06/15/2013    25 mm [facility] Mitral bovine pericardial tissue valve  . Fracture, fibula, shaft 01/30/2015    right     Current Outpatient Prescriptions  Medication Sig  Dispense Refill  . acetaminophen (TYLENOL) 500 MG tablet Take 500 mg by mouth every 6 (six) hours as needed.    Marland Kitchen amiodarone (PACERONE) 200 MG tablet Take 200 mg by mouth 2 (two) times daily.    Marland Kitchen apixaban (ELIQUIS) 2.5 MG TABS tablet Take 1 tablet (2.5 mg total) by mouth 2 (two) times daily. 180 tablet 3  . calcium citrate-vitamin D (CITRACAL+D) 315-200 MG-UNIT per tablet Take 1 tablet by mouth daily.    Marland Kitchen docusate sodium (COLACE) 100 MG capsule Take 100 mg by mouth 2 (two) times daily.      . ferrous sulfate 325 (65 FE) MG tablet Take 1 tablet (325 mg total) by mouth 2 (two) times daily with a meal. 180 tablet 1  . furosemide (LASIX) 80 MG tablet Take 1 tablet (80 mg total) by mouth daily. 30 tablet 6  . hydrALAZINE (APRESOLINE) 10 MG tablet Take 10 mg by mouth 2 (two) times daily.    . Insulin Lispro, Human, (HUMALOG KWIKPEN) 200 UNIT/ML SOPN Inject 0-9 Units into the skin 4 (four) times daily as needed. 9 mL 3  . Insulin Pen Needle (NOVOFINE) 32G X 6 MM MISC Use daily with insulin 300 each 3  . Insulin Pen Needle (NOVOFINE) 32G X 6 MM MISC 1 Act by Does not apply route daily. USE QID 400 each 3  . isosorbide mononitrate (IMDUR) 30 MG 24 hr tablet Take 0.5 tablets (15 mg total) by mouth daily. 30 tablet 6  . metolazone (ZAROXOLYN) 5 MG tablet Take 5 mg by mouth as needed (for weight 133 or greater).    . milrinone (PRIMACOR) 20 MG/100ML SOLN infusion Inject 15.475 mcg/min into the vein continuous. 100 mL 6  . potassium chloride SA (K-DUR,KLOR-CON) 20 MEQ tablet Take 2 tablets by mouth in the AM and take 1 tablet by mouth in the PM     No current facility-administered medications for this encounter.   Social History   Social History  . Marital Status: Divorced    Spouse Name: N/A  . Number of Children: N/A  . Years of Education: N/A   Social History Main Topics  . Smoking status: Never Smoker   . Smokeless tobacco: Never Used  . Alcohol Use: No  . Drug Use: No  . Sexual Activity: No    Other Topics Concern  . Not on file   Social History Narrative   Patient lives alone here in town   She continues to work, helping to bind and oversew books   She is divorced after 23 years of marriage   1 son- '61, minister; 1 dtr '55; 4 grandchildren   End of life care-provided packet on living well   Family History  Problem Relation Age of Onset  . Colon cancer Mother   . Diabetes Neg Hx   . Coronary artery disease Neg Hx     Filed Vitals:   06/28/15 1034  BP: 118/64  Pulse: 86  Weight: 129 lb 8 oz (58.741 kg)  SpO2: 95%    PHYSICAL EXAM: General: Elderly. NAD; Daughter present.  HEENT: normal Neck: supple. JVP flat. Carotids 2+ bilaterally; no bruits. No lymphadenopathy or thryomegaly appreciated. Cor: PMI normal. Regular rate & rhythm. 1/6 SEM. +S3.    Lungs: CTA  Abdomen: soft, nontender, nondistended. No hepatosplenomegaly. No bruits or masses. Good bowel sounds. Extremities: no cyanosis, clubbing, rash.  Trace edema bilaterally. RUE PICC. Neuro: alert & orientedx3, cranial nerves grossly intact. Moves all 4 extremities w/o difficulty. Affect pleasant  ASSESSMENT & PLAN: 1. Chronic systolic CHF: EF 50-35% (4/16).  Nonischemic cardiomyopathy.  On chronic milrinone 0.25 mcg.  --doing fairly well NYHA II-III on milrinone. Volume status stable --Continue metolazone 2.5 mg with 20 KCL as needed for weights above 131. --Continue current dose of hydralazine/imdur. No Arb with CKD.    2. Carotid stenosis: 40-59% RICA stenosis stable 3. Hyperlipidemia: LDL 93 in 3/16, would not change statin.  4.  Aortic stenosis/mitral regurgitation: s/p bioprosthetic AVR/MVR.   5. CKD: follow BMET 6. PAF:  No evidence of recurrence.Continue apixaban. Had not changed amio at rehab so continued 200 BID at home.  Decrease amiodarone 200 mg daily today. 7. [facility] Jude CRT-D -Followed by EP     Volume status stable. TSH elevated on recent lab.  Will have [facility] follow with repeat TSH and free  T4 on next lab draw.   Graciella Freer, PA-C 06/28/2015   Patient seen and examined with Otilio Saber, PA-C. We discussed all aspects of the encounter. I agree with the assessment and plan as stated above.   She looks great. Much improved on milrinone. Daughter doing a very good job managing volume and may even be a bit on the dry side. Recent Optivol reading showed fluid level flat. Will continue current regimen. Watch renal function closely.   She has failed warfarin multiple times due to very high INRs. Now on Eliquis and tolerating well. Working on Therapist, occupational. She does have bioprosthetic valves but AF is nonvalvular so I think benefit of DOAC outweighs risk.   REDS reading 38%  Bensimhon, Daniel,MD 12:05 PM

## 2015-06-28 NOTE — Patient Instructions (Signed)
DECREASE Amiodarone to 200 mg, one tab daily  Labs needed, [facility] will draw the labs needed at your next scheduled home visit  Your physician recommends that you schedule a follow-up appointment in: 4-6 weeks  Do the following things EVERYDAY: 1) Weigh yourself in the morning before breakfast. Write it down and keep it in a log. 2) Take your medicines as prescribed 3) Eat low salt foods-Limit salt (sodium) to 2000 mg per day.  4) Stay as active as you can everyday 5) Limit all fluids for the day to less than 2 liters 6)

## 2015-06-29 ENCOUNTER — Telehealth (HOSPITAL_COMMUNITY): Payer: Self-pay | Admitting: Cardiology

## 2015-06-29 ENCOUNTER — Other Ambulatory Visit (HOSPITAL_COMMUNITY): Payer: Self-pay | Admitting: Internal Medicine

## 2015-06-29 NOTE — Telephone Encounter (Signed)
Erin,PT with [facility] called to request orders to continue PT Verbal orders given to continue home PT

## 2015-07-03 ENCOUNTER — Encounter: Payer: Self-pay | Admitting: Internal Medicine

## 2015-07-06 ENCOUNTER — Other Ambulatory Visit (HOSPITAL_COMMUNITY): Payer: Self-pay | Admitting: Internal Medicine

## 2015-07-09 ENCOUNTER — Encounter: Payer: Self-pay | Admitting: Internal Medicine

## 2015-07-10 ENCOUNTER — Telehealth (HOSPITAL_COMMUNITY): Payer: Self-pay | Admitting: Cardiology

## 2015-07-10 DIAGNOSIS — Z45018 Encounter for adjustment and management of other part of cardiac pacemaker: Secondary | ICD-10-CM

## 2015-07-10 MED ORDER — POTASSIUM CHLORIDE CRYS ER 20 MEQ PO TBCR: 40.0000 meq | EXTENDED_RELEASE_TABLET | Freq: Two times a day (BID) | ORAL | Status: DC

## 2015-07-10 NOTE — Telephone Encounter (Signed)
pts daughter aware Labs drawn 9/19 k 3.6 Per [name] Patient should increase potassium to 40 Meq BID

## 2015-07-13 ENCOUNTER — Other Ambulatory Visit (HOSPITAL_COMMUNITY): Payer: Self-pay | Admitting: Internal Medicine

## 2015-07-18 ENCOUNTER — Ambulatory Visit (INDEPENDENT_AMBULATORY_CARE_PROVIDER_SITE_OTHER): Payer: Medicare Other | Admitting: Internal Medicine

## 2015-07-18 ENCOUNTER — Encounter: Payer: Self-pay | Admitting: Internal Medicine

## 2015-07-18 ENCOUNTER — Other Ambulatory Visit (INDEPENDENT_AMBULATORY_CARE_PROVIDER_SITE_OTHER): Payer: Medicare Other

## 2015-07-18 VITALS — BP 120/70 | HR 81 | Temp 98.0°F | Resp 16 | Ht 62.0 in | Wt 132.0 lb

## 2015-07-18 DIAGNOSIS — D638 Anemia in other chronic diseases classified elsewhere: Secondary | ICD-10-CM

## 2015-07-18 DIAGNOSIS — N189 Chronic kidney disease, unspecified: Secondary | ICD-10-CM

## 2015-07-18 DIAGNOSIS — E1122 Type 2 diabetes mellitus with diabetic chronic kidney disease: Secondary | ICD-10-CM | POA: Diagnosis not present

## 2015-07-18 DIAGNOSIS — Z23 Encounter for immunization: Secondary | ICD-10-CM

## 2015-07-18 DIAGNOSIS — I6523 Occlusion and stenosis of bilateral carotid arteries: Secondary | ICD-10-CM | POA: Diagnosis not present

## 2015-07-18 DIAGNOSIS — I10 Essential (primary) hypertension: Secondary | ICD-10-CM

## 2015-07-18 LAB — CBC WITH DIFFERENTIAL/PLATELET
BASOS ABS: 0 10*3/uL (ref 0.0–0.1)
Basophils Relative: 0.1 % (ref 0.0–3.0)
EOS ABS: 0.1 10*3/uL (ref 0.0–0.7)
Eosinophils Relative: 1.4 % (ref 0.0–5.0)
HCT: 37.8 % (ref 36.0–46.0)
Hemoglobin: 12.6 g/dL (ref 12.0–15.0)
LYMPHS ABS: 1 10*3/uL (ref 0.7–4.0)
LYMPHS PCT: 11.1 % — AB (ref 12.0–46.0)
MCHC: 33.2 g/dL (ref 30.0–36.0)
MCV: 91.9 fl (ref 78.0–100.0)
MONOS PCT: 3 % (ref 3.0–12.0)
Monocytes Absolute: 0.3 10*3/uL (ref 0.1–1.0)
NEUTROS ABS: 7.6 10*3/uL (ref 1.4–7.7)
NEUTROS PCT: 84.4 % — AB (ref 43.0–77.0)
PLATELETS: 218 10*3/uL (ref 150.0–400.0)
RBC: 4.11 Mil/uL (ref 3.87–5.11)
RDW: 16.6 % — ABNORMAL HIGH (ref 11.5–15.5)
WBC: 9.1 10*3/uL (ref 4.0–10.5)

## 2015-07-18 LAB — BASIC METABOLIC PANEL
BUN: 78 mg/dL — ABNORMAL HIGH (ref 6–23)
CALCIUM: 9.7 mg/dL (ref 8.4–10.5)
CO2: 30 meq/L (ref 19–32)
CREATININE: 2.05 mg/dL — AB (ref 0.40–1.20)
Chloride: 103 mEq/L (ref 96–112)
GFR: 24.58 mL/min — AB (ref >60.00)
GLUCOSE: 198 mg/dL — AB (ref 70–99)
Potassium: 4.6 mEq/L (ref 3.5–5.1)
SODIUM: 141 meq/L (ref 135–145)

## 2015-07-18 LAB — HEMOGLOBIN A1C: Hgb A1c MFr Bld: 7.4 % — ABNORMAL HIGH (ref 4.6–6.5)

## 2015-07-18 NOTE — Progress Notes (Signed)
Pre visit review using our clinic review tool, if applicable. No additional management support is needed unless otherwise documented below in the visit note. 

## 2015-07-18 NOTE — Progress Notes (Signed)
Subjective:  Patient ID: Joanna [name], female    DOB: 11/02/1929  Age: 81 y.o. MRN: [id number]  CC: Diabetes and Hypertension   HPI Joanna [name] presents for follow up, she feels at her baseline with chronic SOB and edema. Her daughter (caretaker) reports that blood sugar occasionally goes up to 400 but it comes down quickly with an insulin injection.  Outpatient Prescriptions Prior to Visit  Medication Sig Dispense Refill  . acetaminophen (TYLENOL) 500 MG tablet Take 500 mg by mouth every 6 (six) hours as needed.    Marland Kitchen amiodarone (PACERONE) 200 MG tablet Take 1 tablet (200 mg total) by mouth daily. 90 tablet 3  . apixaban (ELIQUIS) 2.5 MG TABS tablet Take 1 tablet (2.5 mg total) by mouth 2 (two) times daily. 180 tablet 3  . calcium citrate-vitamin D (CITRACAL+D) 315-200 MG-UNIT per tablet Take 1 tablet by mouth daily.    Marland Kitchen docusate sodium (COLACE) 100 MG capsule Take 100 mg by mouth 2 (two) times daily.      . ferrous sulfate 325 (65 FE) MG tablet Take 1 tablet (325 mg total) by mouth 2 (two) times daily with a meal. 180 tablet 1  . furosemide (LASIX) 80 MG tablet Take 1 tablet (80 mg total) by mouth daily. 30 tablet 6  . hydrALAZINE (APRESOLINE) 10 MG tablet Take 10 mg by mouth 2 (two) times daily.    . Insulin Lispro, Human, (HUMALOG KWIKPEN) 200 UNIT/ML SOPN Inject 0-9 Units into the skin 4 (four) times daily as needed. 9 mL 3  . Insulin Pen Needle (NOVOFINE) 32G X 6 MM MISC Use daily with insulin 300 each 3  . Insulin Pen Needle (NOVOFINE) 32G X 6 MM MISC 1 Act by Does not apply route daily. USE QID 400 each 3  . isosorbide mononitrate (IMDUR) 30 MG 24 hr tablet Take 0.5 tablets (15 mg total) by mouth daily. 30 tablet 6  . metolazone (ZAROXOLYN) 5 MG tablet Take 5 mg by mouth as needed (for weight 133 or greater).    . milrinone (PRIMACOR) 20 MG/100ML SOLN infusion Inject 15.475 mcg/min into the vein continuous. 100 mL 6  . potassium chloride SA (K-DUR,KLOR-CON) 20 MEQ tablet Take  2 tablets (40 mEq total) by mouth 2 (two) times daily. 120 tablet 0   No facility-administered medications prior to visit.    ROS Review of Systems  Constitutional: Positive for fatigue. Negative for fever, chills, diaphoresis, appetite change and unexpected weight change.  HENT: Negative.   Eyes: Negative.   Respiratory: Positive for shortness of breath. Negative for cough, choking, chest tightness, wheezing and stridor.   Cardiovascular: Positive for leg swelling. Negative for chest pain and palpitations.  Gastrointestinal: Negative.  Negative for nausea, vomiting, abdominal pain, diarrhea, constipation and blood in stool.  Endocrine: Negative.   Genitourinary: Negative.  Negative for dysuria, urgency, frequency, hematuria and difficulty urinating.  Musculoskeletal: Negative.   Skin: Negative.   Allergic/Immunologic: Negative.   Neurological: Negative.  Negative for dizziness, tremors, weakness, light-headedness and numbness.  Hematological: Negative.  Negative for adenopathy. Does not bruise/bleed easily.  Psychiatric/Behavioral: Negative.     Objective:  BP 120/70 mmHg  Pulse 81  Temp(Src) 98 F (36.7 C) (Oral)  Resp 16  Ht  (1.575 m)  Wt 132 lb (59.875 kg)  BMI 24.14 kg/m2  SpO2 97%  BP Readings from Last 3 Encounters:  07/18/15 120/70  06/28/15 118/64  06/21/15 110/70    Wt Readings from Last 3 Encounters:  07/18/15 132 lb (59.875 kg)  06/28/15 129 lb 8 oz (58.741 kg)  05/28/15 127 lb (57.607 kg)    Physical Exam  Constitutional: She is oriented to person, place, and time. She appears well-developed and well-nourished.  Non-toxic appearance. She has a sickly appearance. She does not appear ill. No distress.  Continuous oxygen  HENT:  Mouth/Throat: Oropharynx is clear and moist. No oropharyngeal exudate.  Eyes: Conjunctivae are normal. Right eye exhibits no discharge. Left eye exhibits no discharge. No scleral icterus.  Neck: Normal range of motion. Neck  supple. No JVD present. No tracheal deviation present. No thyromegaly present.  Cardiovascular: Normal rate, regular rhythm, normal heart sounds and intact distal pulses.  Exam reveals no gallop and no friction rub.   No murmur heard. Pulmonary/Chest: Effort normal and breath sounds normal. No stridor. No respiratory distress. She has no wheezes. She has no rales. She exhibits no tenderness.  Abdominal: Soft. Bowel sounds are normal. She exhibits no distension and no mass. There is no tenderness. There is no rebound and no guarding.  Musculoskeletal: Normal range of motion. She exhibits edema (trace pitting edema in BLE). She exhibits no tenderness.  Lymphadenopathy:    She has no cervical adenopathy.  Neurological: She is oriented to person, place, and time.  Skin: Skin is warm and dry. No rash noted. She is not diaphoretic. No erythema. No pallor.  Vitals reviewed.   Lab Results  Component Value Date   WBC 9.1 07/18/2015   HGB 12.6 07/18/2015   HCT 37.8 07/18/2015   PLT 218.0 07/18/2015   GLUCOSE 198* 07/18/2015   CHOL 169 12/25/2014   TRIG 108.0 12/25/2014   HDL 54.70 12/25/2014   LDLDIRECT 120.6 11/28/2013   LDLCALC 93 12/25/2014   ALT 27 03/31/2015   AST 44* 03/31/2015   NA 141 07/18/2015   K 4.6 07/18/2015   CL 103 07/18/2015   CREATININE 2.05* 07/18/2015   BUN 78* 07/18/2015   CO2 30 07/18/2015   TSH 2.010 12/13/2014   INR 3.3 07/07/2014   HGBA1C 7.4* 07/18/2015   MICROALBUR 3.8* 12/25/2014    No results found.  Assessment & Plan:   Joanna [name] was seen today for diabetes and hypertension.  Diagnoses and all orders for this visit:  Essential hypertension- her BP is well controlled, her lytes and renal function are stable -     Basic metabolic panel; Future  Need for influenza vaccination -     Flu vaccine HIGH DOSE PF  Type 2 diabetes mellitus with diabetic chronic kidney disease- her blood sugars are adequately well controlled, will cont the current insulin  therapy -     Basic metabolic panel; Future -     Hemoglobin A1c; Future  Anemia of chronic disease- improvement noted -     CBC with Differential/Platelet; Future   I am having [name] maintain her docusate sodium, ferrous sulfate, calcium citrate-vitamin D, Insulin Pen Needle, milrinone, furosemide, Insulin Lispro (Human), Insulin Pen Needle, isosorbide mononitrate, acetaminophen, apixaban, hydrALAZINE, metolazone, amiodarone, and potassium chloride SA.  No orders of the defined types were placed in this encounter.     Follow-up: Return in about 6 months (around 01/15/2016).  Sanda Linger, MD

## 2015-07-18 NOTE — Patient Instructions (Signed)

## 2015-07-20 ENCOUNTER — Other Ambulatory Visit (HOSPITAL_COMMUNITY): Payer: Self-pay | Admitting: Internal Medicine

## 2015-07-23 ENCOUNTER — Ambulatory Visit: Payer: Medicare Other | Admitting: Internal Medicine

## 2015-07-24 ENCOUNTER — Telehealth (HOSPITAL_COMMUNITY): Payer: Self-pay | Admitting: Cardiology

## 2015-07-24 NOTE — Telephone Encounter (Signed)
VERBAL ORDERS GIVEN TO CONTINUE TO SEE PATIENT

## 2015-07-25 ENCOUNTER — Telehealth (HOSPITAL_COMMUNITY): Payer: Self-pay | Admitting: *Deleted

## 2015-07-25 NOTE — Telephone Encounter (Signed)
Pt's daughter called for samples of Eliquis 2.5 mg, samples left at front desk for her to pick up later today  Medication Samples have been provided to the patient.  Drug name: Eliquis  Qty: 5  LOT: [id number]  Exp.Date: 10/2016  The patient has been instructed regarding the correct time, dose, and frequency of taking this medication, including desired effects and most common side effects.   Joanna [name] 9:08 AM 07/25/2015

## 2015-07-27 ENCOUNTER — Other Ambulatory Visit (HOSPITAL_COMMUNITY): Payer: Self-pay | Admitting: Internal Medicine

## 2015-08-02 ENCOUNTER — Ambulatory Visit (HOSPITAL_COMMUNITY)
Admission: RE | Admit: 2015-08-02 | Discharge: 2015-08-02 | Disposition: A | Discharge status: Home / Self Care | Payer: Medicare Other | Source: Ambulatory Visit | Attending: Cardiology | Admitting: Cardiology

## 2015-08-02 VITALS — BP 118/68 | HR 90 | Temp 98.6°F | Resp 16 | Wt 130.5 lb

## 2015-08-02 DIAGNOSIS — I480 Paroxysmal atrial fibrillation: Secondary | ICD-10-CM | POA: Insufficient documentation

## 2015-08-02 DIAGNOSIS — I129 Hypertensive chronic kidney disease with stage 1 through stage 4 chronic kidney disease, or unspecified chronic kidney disease: Secondary | ICD-10-CM | POA: Diagnosis not present

## 2015-08-02 DIAGNOSIS — Z95810 Presence of automatic (implantable) cardiac defibrillator: Secondary | ICD-10-CM | POA: Diagnosis not present

## 2015-08-02 DIAGNOSIS — I6521 Occlusion and stenosis of right carotid artery: Secondary | ICD-10-CM | POA: Insufficient documentation

## 2015-08-02 DIAGNOSIS — Z7902 Long term (current) use of antithrombotics/antiplatelets: Secondary | ICD-10-CM | POA: Insufficient documentation

## 2015-08-02 DIAGNOSIS — Z953 Presence of xenogenic heart valve: Secondary | ICD-10-CM | POA: Diagnosis not present

## 2015-08-02 DIAGNOSIS — I428 Other cardiomyopathies: Secondary | ICD-10-CM | POA: Diagnosis not present

## 2015-08-02 DIAGNOSIS — I739 Peripheral vascular disease, unspecified: Secondary | ICD-10-CM | POA: Insufficient documentation

## 2015-08-02 DIAGNOSIS — E1122 Type 2 diabetes mellitus with diabetic chronic kidney disease: Secondary | ICD-10-CM | POA: Insufficient documentation

## 2015-08-02 DIAGNOSIS — N184 Chronic kidney disease, stage 4 (severe): Secondary | ICD-10-CM | POA: Diagnosis not present

## 2015-08-02 DIAGNOSIS — E785 Hyperlipidemia, unspecified: Secondary | ICD-10-CM | POA: Insufficient documentation

## 2015-08-02 DIAGNOSIS — N189 Chronic kidney disease, unspecified: Secondary | ICD-10-CM | POA: Diagnosis not present

## 2015-08-02 DIAGNOSIS — Z79899 Other long term (current) drug therapy: Secondary | ICD-10-CM | POA: Insufficient documentation

## 2015-08-02 DIAGNOSIS — Z794 Long term (current) use of insulin: Secondary | ICD-10-CM | POA: Insufficient documentation

## 2015-08-02 DIAGNOSIS — I5022 Chronic systolic (congestive) heart failure: Secondary | ICD-10-CM | POA: Insufficient documentation

## 2015-08-02 NOTE — Patient Instructions (Signed)
Follow-up in 4 weeks

## 2015-08-02 NOTE — Progress Notes (Signed)
Patient ID: Joanna [name], female   DOB: January 14, 1930, 81 y.o.   MRN: [id number]  ADVANCED HF CLINIC NOTE  Patient ID: Joanna [name], female   DOB: 08/30/1930, 81 y.o.   MRN: [id number]  PCP: Dr Yetta Barre ([facility] [place]) CVTS Surgeon: Dr. Cornelius Moras Primary Cardiologist: Dr. Gala Romney DNR/DNI   HPI: Joanna [name] is a 82 year old woman with aortic stenosis s/p AVR/mitral valve replacement (06/15/2013), systolic HF due to NICM, hypertension, diabetes mellitus type II, hyperlipidemia, and peripheral artery disease.  Underwent AVR/MVR with placement of epicardial lead on 06/15/13 with PAF post-op terminated with amiodarone.  Upgraded to [facility] Jude CRT-D 03/22/2015.    Admitted from 5/27 through 03/30/2015 with acute/chronic systolic HF and cardiogenic shock. She was diuresed with IV lasix and milrinone. Unable to wean milrinone and discharged on 0.25 mcg. While hospitalized CRT-D placed on 6/2. She was discharged to [facility].   Readmitted 6/11 with hypoglycemia. Also had sepsis with HCAP and received antibiotic course. She remained on milrinone 0.375 mcg. Discharge weight was 128 pounds.   She presents today for HF follow up. Overall feeling ok. Has a cold. Mild dyspnea with exertion. Weight at home 129 pounds. Taking metolazone as needed. Continues on milrinone with [facility] following. PICC site ok.  Taking medications. Ambulating with a walker. Livign with her daughter.   05/19/2013 ECHO EF 35% severe AS (low gradient severe AS confirmed by DSE), gradient: 32 mm Hg (S). Peak gradient: 50mm Hg  08/18/13 ECHO EF 45-50% AV working well 2/16 ECHO with EF 20-25%, moderately dilated LV with mild LVH, normal bioprosthetic mitral and aortic valves, moderate TR, PA systolic pressure 54 mmHg.  4/16 ECHO EF 20-25%, diffuse hypokinesis, mild LVH, bioprosthetic aortic valve functioning normally, bioprosthetic mitral valve with mild central MR and mean gradient 4 mmHg.   Labs 9/14: K 5.4, creatinine 1.69 09/2013: K+ 4.6, creatinine  1.08, pro-BNP 11643 10/17/13: K+ 4.4, creatinine 1.6 11/28/14: K+ 4.1, Creatinine 1.6 12/15/14: K 3.5, creatinine 1.72 4/16: K 4.2, creatinine 1.86, hgb 8.8 5/16: K 4 => 3.1, creatinine 1.68 => 1.61, BNP 4039 => 4185 04/09/2015: K 3.4 Creatinine 2.01  04/16/2915: K 4.2 Creatinine 1.8 Hgb 10.3  06/11/2015: K 4.1 Creatinine 1.9 07/24/15: K 4.5 Creatinine 1.8 WBC 7.2   ROS:  12-point review of systems went over in clinic and all negative except as described in HPI and problem list.  Past Medical History  Diagnosis Date  . Peripheral artery disease   . Hyperlipidemia        . Arthritis   . Aortic stenosis      a. s/p AVR  . BUNDLE BRANCH BLOCK, LEFT    . CAROTID ARTERY STENOSIS 01/05/2009  . CVA 03/14/2010  . PERIPHERAL VASCULAR DISEASE 07/30/2007  . Chronic combined systolic and diastolic CHF (congestive heart failure)     a. RHC 09/2011 RA 8, RV 58/2/11; PA 54/22 (37) PCWP 20; F CO/CI 4.57/2.67 PVR 3.7; b. L main no sig dz, LAD luminal irreg; LCx lg Ramus with luminal irreg, small AV Lcx witout sig dz, RCA luminal irreg; severe mitral annular calcifications; AV calcified c. EF 45-50% (07/2013);  d. 11/2014 Echo: EF 20-25%, diff HK, nl AV/MV, sev dil LA, mod TR/PR, PASP .  . Diabetes mellitus   . HTN (hypertension)   . Iron deficiency anemia 09/21/2011  . History of shingles   . Mitral regurgitation     a. s/p MVR  . S/P aortic valve replacement with bioprosthetic valve 06/15/2013  21 mm [facility] Ease bovine pericardial tissue valve  . S/P mitral valve replacement with bioprosthetic valve 06/15/2013    25 mm [facility] Mitral bovine pericardial tissue valve  . Fracture, fibula, shaft 01/30/2015    right     Current Outpatient Prescriptions  Medication Sig Dispense Refill  . acetaminophen (TYLENOL) 500 MG tablet Take 500 mg by mouth every 6 (six) hours as needed.    Marland Kitchen amiodarone (PACERONE) 200 MG tablet Take 1 tablet (200 mg total) by mouth daily. 90 tablet 3  . apixaban  (ELIQUIS) 2.5 MG TABS tablet Take 1 tablet (2.5 mg total) by mouth 2 (two) times daily. 180 tablet 3  . calcium citrate-vitamin D (CITRACAL+D) 315-200 MG-UNIT per tablet Take 1 tablet by mouth daily.    Marland Kitchen docusate sodium (COLACE) 100 MG capsule Take 100 mg by mouth 2 (two) times daily.      . ferrous sulfate 325 (65 FE) MG tablet Take 1 tablet (325 mg total) by mouth 2 (two) times daily with a meal. 180 tablet 1  . furosemide (LASIX) 80 MG tablet Take 1 tablet (80 mg total) by mouth daily. 30 tablet 6  . hydrALAZINE (APRESOLINE) 10 MG tablet Take 10 mg by mouth 2 (two) times daily.    . Insulin Lispro, Human, (HUMALOG KWIKPEN) 200 UNIT/ML SOPN Inject 0-9 Units into the skin 4 (four) times daily as needed. 9 mL 3  . Insulin Pen Needle (NOVOFINE) 32G X 6 MM MISC Use daily with insulin 300 each 3  . Insulin Pen Needle (NOVOFINE) 32G X 6 MM MISC 1 Act by Does not apply route daily. USE QID 400 each 3  . isosorbide mononitrate (IMDUR) 30 MG 24 hr tablet Take 0.5 tablets (15 mg total) by mouth daily. 30 tablet 6  . metolazone (ZAROXOLYN) 5 MG tablet Take 5 mg by mouth as needed (for weight 133 or greater).    . milrinone (PRIMACOR) 20 MG/100ML SOLN infusion Inject 15.475 mcg/min into the vein continuous. 100 mL 6  . potassium chloride SA (K-DUR,KLOR-CON) 20 MEQ tablet Take 2 tablets (40 mEq total) by mouth 2 (two) times daily. 120 tablet 0   No current facility-administered medications for this encounter.   Social History   Social History  . Marital Status: Divorced    Spouse Name: N/A  . Number of Children: N/A  . Years of Education: N/A   Social History Main Topics  . Smoking status: Never Smoker   . Smokeless tobacco: Never Used  . Alcohol Use: No  . Drug Use: No  . Sexual Activity: No   Other Topics Concern  . Not on file   Social History Narrative   Patient lives alone here in town   She continues to work, helping to bind and oversew books   She is divorced after 23 years of  marriage   1 son- '61, minister; 1 dtr '55; 4 grandchildren   End of life care-provided packet on living well   Family History  Problem Relation Age of Onset  . Colon cancer Mother   . Diabetes Neg Hx   . Coronary artery disease Neg Hx     Filed Vitals:   08/02/15 1044  BP: 118/68  Pulse: 90  Resp: 16  Weight: 130 lb 8 oz (59.194 kg)  SpO2: 99%    PHYSICAL EXAM: General: Elderly. NAD; Daughter present. Ambulated in the clinic with a cane. HEENT: normal Neck: supple. JVP 6-7 . Carotids 2+ bilaterally; no bruits.  No lymphadenopathy or thryomegaly appreciated. Cor: PMI normal. Regular rate & rhythm. 1/6 SEM. +S3.    Lungs: CTA  Abdomen: soft, nontender, nondistended. No hepatosplenomegaly. No bruits or masses. Good bowel sounds. Extremities: no cyanosis, clubbing, rash.  No edema bilaterally. RUE PICC. Neuro: alert & orientedx3, cranial nerves grossly intact. Moves all 4 extremities w/o difficulty. Affect pleasant  ASSESSMENT & PLAN: 1. Chronic systolic CHF: EF 69-62% (4/16).  Nonischemic cardiomyopathy.  On chronic milrinone 0.25 mcg. Continue weeklly lab work.  -- NYHA II-III on milrinone. Volume status stable. Continue lasix 80 mg daily --Continue metolazone 2.5 mg with 20 KCL as needed for weights above 131 pounds . --Continue current dose of hydralazine/imdur. No Arb with CKD.    2. Carotid stenosis: 40-59% RICA stenosis stable 3. Hyperlipidemia: LDL 93 in 3/16, would not change statin.  4.  Aortic stenosis/mitral regurgitation: s/p bioprosthetic AVR/MVR.   5. CKD: follow BMET weekly 6. PAF:  No evidence of recurrence.Continue apixaban 2.5 mg twice a day. . Continue amio 200 mg daily.  7. St Jude CRT-D -Followed by EP     Follow up in 4 weeks.  Tonye Becket, NP-C  08/02/2015

## 2015-08-07 ENCOUNTER — Emergency Department (HOSPITAL_COMMUNITY)
Admission: EM | Admit: 2015-08-07 | Discharge: 2015-08-07 | Disposition: A | Discharge status: Home / Self Care | Payer: Medicare Other | Attending: Emergency Medicine | Admitting: Emergency Medicine

## 2015-08-07 ENCOUNTER — Telehealth: Payer: Self-pay | Admitting: Physician Assistant

## 2015-08-07 ENCOUNTER — Encounter (HOSPITAL_COMMUNITY): Payer: Self-pay | Admitting: Emergency Medicine

## 2015-08-07 DIAGNOSIS — Z8781 Personal history of (healed) traumatic fracture: Secondary | ICD-10-CM | POA: Insufficient documentation

## 2015-08-07 DIAGNOSIS — E119 Type 2 diabetes mellitus without complications: Secondary | ICD-10-CM | POA: Insufficient documentation

## 2015-08-07 DIAGNOSIS — Z794 Long term (current) use of insulin: Secondary | ICD-10-CM | POA: Insufficient documentation

## 2015-08-07 DIAGNOSIS — M1990 Unspecified osteoarthritis, unspecified site: Secondary | ICD-10-CM | POA: Diagnosis not present

## 2015-08-07 DIAGNOSIS — T82868A Thrombosis of vascular prosthetic devices, implants and grafts, initial encounter: Secondary | ICD-10-CM | POA: Diagnosis not present

## 2015-08-07 DIAGNOSIS — Z8619 Personal history of other infectious and parasitic diseases: Secondary | ICD-10-CM | POA: Insufficient documentation

## 2015-08-07 DIAGNOSIS — T82898A Other specified complication of vascular prosthetic devices, implants and grafts, initial encounter: Secondary | ICD-10-CM

## 2015-08-07 DIAGNOSIS — D649 Anemia, unspecified: Secondary | ICD-10-CM | POA: Diagnosis not present

## 2015-08-07 DIAGNOSIS — I10 Essential (primary) hypertension: Secondary | ICD-10-CM | POA: Diagnosis not present

## 2015-08-07 DIAGNOSIS — Z8673 Personal history of transient ischemic attack (TIA), and cerebral infarction without residual deficits: Secondary | ICD-10-CM | POA: Insufficient documentation

## 2015-08-07 DIAGNOSIS — Y713 Surgical instruments, materials and cardiovascular devices (including sutures) associated with adverse incidents: Secondary | ICD-10-CM | POA: Diagnosis not present

## 2015-08-07 DIAGNOSIS — I5042 Chronic combined systolic (congestive) and diastolic (congestive) heart failure: Secondary | ICD-10-CM | POA: Insufficient documentation

## 2015-08-07 DIAGNOSIS — E785 Hyperlipidemia, unspecified: Secondary | ICD-10-CM | POA: Diagnosis not present

## 2015-08-07 DIAGNOSIS — Z79899 Other long term (current) drug therapy: Secondary | ICD-10-CM | POA: Insufficient documentation

## 2015-08-07 NOTE — ED Provider Notes (Signed)
CSN: [id number]     Arrival date & time 08/07/15  1803 History   First MD Initiated Contact with Patient 08/07/15 1821     Chief Complaint  Patient presents with  . Vascular Access Problem     (Consider location/radiation/quality/duration/timing/severity/associated sxs/prior Treatment) HPI..... Status post PICC line placement and right upper arm in May 2016 at [facility] for medication infusion. Apparently line became occluded today. Home health nurse attempted to fix the problem, but to no avail. Patient was sent to the emergency room. No chest pain or dyspnea. No fever or chills. No erythema over insertion site  Past Medical History  Diagnosis Date  . Peripheral artery disease (HCC)   . Hyperlipidemia        . Arthritis   . Aortic stenosis      a. s/p AVR  . BUNDLE BRANCH BLOCK, LEFT    . CAROTID ARTERY STENOSIS 01/05/2009  . CVA 03/14/2010  . PERIPHERAL VASCULAR DISEASE 07/30/2007  . Chronic combined systolic and diastolic CHF (congestive heart failure) (HCC)     a. RHC 09/2011 RA 8, RV 58/2/11; PA 54/22 (37) PCWP 20; F CO/CI 4.57/2.67 PVR 3.7; b. L main no sig dz, LAD luminal irreg; LCx lg Ramus with luminal irreg, small AV Lcx witout sig dz, RCA luminal irreg; severe mitral annular calcifications; AV calcified c. EF 45-50% (07/2013);  d. 11/2014 Echo: EF 20-25%, diff HK, nl AV/MV, sev dil LA, mod TR/PR, PASP .  . Diabetes mellitus   . HTN (hypertension)   . Iron deficiency anemia 09/21/2011  . History of shingles   . Mitral regurgitation     a. s/p MVR  . S/P aortic valve replacement with bioprosthetic valve 06/15/2013    21 mm [facility] Ease bovine pericardial tissue valve  . S/P mitral valve replacement with bioprosthetic valve 06/15/2013    25 mm [facility] Mitral bovine pericardial tissue valve  . Fracture, fibula, shaft 01/30/2015    right   Past Surgical History  Procedure Laterality Date  . Carotid endarterectomy Left 2011  . Polypectomy  12/27/04   . Cataract extraction Bilateral 2009  . Tee without cardioversion N/A 05/19/2013    Procedure: TRANSESOPHAGEAL ECHOCARDIOGRAM (TEE);  Surgeon: Laurey Morale, MD;  Location[facility] ENDOSCOPY;  Service: Cardiovascular;  Laterality: N/A;  . Colonoscopy    . Cardiac catheterization  2012  . Aortic valve replacement N/A 06/15/2013    Procedure: AORTIC VALVE REPLACEMENT (AVR);  Surgeon: Purcell Nails, MD;  Location: [facility] OR;  Service: Open Heart Surgery;  Laterality: N/A;  . Intraoperative transesophageal echocardiogram N/A 06/15/2013    Procedure: INTRAOPERATIVE TRANSESOPHAGEAL ECHOCARDIOGRAM;  Surgeon: Purcell Nails, MD;  Location: [facility] OR;  Service: Open Heart Surgery;  Laterality: N/A;  . Mitral valve replacement N/A 06/15/2013    Procedure: MITRAL VALVE (MV) REPLACEMENT;  Surgeon: Purcell Nails, MD;  Location: [facility] OR;  Service: Open Heart Surgery;  Laterality: N/A;  . Epicardial pacing lead placement N/A 06/15/2013    Procedure: EPICARDIAL PACING LEAD PLACEMENT;  Surgeon: Purcell Nails, MD;  Location: [facility] OR;  Service: Open Heart Surgery;  Laterality: N/A;  . Left and right heart catheterization with coronary angiogram N/A 09/19/2011    Procedure: LEFT AND RIGHT HEART CATHETERIZATION WITH CORONARY ANGIOGRAM;  Surgeon: Dolores Patty, MD;  Location: [facility] CATH LAB;  Service: Cardiovascular;  Laterality: N/A;  . Orif ankle fracture Right 01/31/2015    Procedure: OPEN REDUCTION INTERNAL FIXATION (ORIF) ANKLE FRACTURE;  Surgeon:  Eldred Manges, MD;  Location: [facility] OR;  Service: Orthopedics;  Laterality: Right;  . Ep implantable device N/A 03/21/2015    Procedure: BiV Pacemaker Insertion CRT-P;  Surgeon: Duke Salvia, MD;  Location: [facility] INVASIVE CV LAB;  Service: Cardiovascular;  Laterality: N/A;  . Pacemaker insertion     Family History  Problem Relation Age of Onset  . Colon cancer Mother   . Diabetes Neg Hx   . Coronary artery disease Neg Hx    Social History  Substance Use Topics  . Smoking  status: Never Smoker   . Smokeless tobacco: Never Used  . Alcohol Use: No   OB History    No data available     Review of Systems  All other systems reviewed and are negative.     Allergies  Other and Prednisone  Home Medications   Prior to Admission medications   Medication Sig Start Date End Date Taking? Authorizing Provider  acetaminophen (TYLENOL) 500 MG tablet Take 500 mg by mouth every 6 (six) hours as needed (pain).    Yes Historical Provider, MD  amiodarone (PACERONE) 200 MG tablet Take 1 tablet (200 mg total) by mouth daily. 06/28/15  Yes Dolores Patty, MD  apixaban (ELIQUIS) 2.5 MG TABS tablet Take 1 tablet (2.5 mg total) by mouth 2 (two) times daily. 06/21/15  Yes Laurey Morale, MD  calcium citrate-vitamin D (CITRACAL+D) 315-200 MG-UNIT per tablet Take 1 tablet by mouth daily.   Yes Historical Provider, MD  docusate sodium (COLACE) 100 MG capsule Take 100 mg by mouth 2 (two) times daily.     Yes Historical Provider, MD  ferrous sulfate 325 (65 FE) MG tablet Take 1 tablet (325 mg total) by mouth 2 (two) times daily with a meal. 02/28/14  Yes Etta Grandchild, MD  furosemide (LASIX) 80 MG tablet Take 1 tablet (80 mg total) by mouth daily. 04/18/15  Yes Laurey Morale, MD  hydrALAZINE (APRESOLINE) 10 MG tablet Take 10 mg by mouth 2 (two) times daily.   Yes Historical Provider, MD  Insulin Lispro, Human, (HUMALOG KWIKPEN) 200 UNIT/ML SOPN Inject 0-9 Units into the skin 4 (four) times daily as needed. 06/05/15  Yes Etta Grandchild, MD  isosorbide mononitrate (IMDUR) 30 MG 24 hr tablet Take 0.5 tablets (15 mg total) by mouth daily. 06/15/15  Yes Dolores Patty, MD  metolazone (ZAROXOLYN) 5 MG tablet Take 5 mg by mouth as needed (for weight 133 or greater).   Yes Historical Provider, MD  milrinone (PRIMACOR) 20 MG/100ML SOLN infusion Inject 15.475 mcg/min into the vein continuous. 04/05/15  Yes Graciella Freer, PA-C  potassium chloride SA (K-DUR,KLOR-CON) 20 MEQ tablet  Take 2 tablets (40 mEq total) by mouth 2 (two) times daily. 07/10/15  Yes Graciella Freer, PA-C  Insulin Pen Needle (NOVOFINE) 32G X 6 MM MISC Use daily with insulin 12/29/14   Etta Grandchild, MD  Insulin Pen Needle (NOVOFINE) 32G X 6 MM MISC 1 Act by Does not apply route daily. USE QID 06/05/15   Etta Grandchild, MD   BP 130/63 mmHg  Pulse 80  Temp(Src) 98.1 F (36.7 C)  Resp 21  SpO2 98% Physical Exam  Constitutional: She is oriented to person, place, and time. She appears well-developed and well-nourished.  HENT:  Head: Normocephalic and atraumatic.  Eyes: Conjunctivae and EOM are normal. Pupils are equal, round, and reactive to light.  Neck: Normal range of motion. Neck supple.  Cardiovascular: Normal rate  and regular rhythm.   Pulmonary/Chest: Effort normal and breath sounds normal.  Abdominal: Soft. Bowel sounds are normal.  Musculoskeletal: Normal range of motion.  Neurological: She is alert and oriented to person, place, and time.  Skin:  PICC line placed in right upper arm distally and medially.  No obvious sialitis.  Psychiatric: She has a normal mood and affect. Her behavior is normal.  Nursing note and vitals reviewed.   ED Course  Procedures (including critical care time) Labs Review Labs Reviewed - No data to display  Imaging Review No results found. I have personally reviewed and evaluated these images and lab results as part of my medical decision-making.   EKG Interpretation None      MDM   Final diagnoses:  Occluded PICC line, initial encounter (HCC)    IV nurse changed the proximal port.   This seemed to fix the problem   Donnetta Hutching, MD 08/07/15 2022

## 2015-08-07 NOTE — Telephone Encounter (Signed)
Patient's daughter called answering service. She thinks that the milrinone pump may have a clot in it. The alarm keeps going off despite HHRN adjusting the line. Home health recommended they come to the ER to get this looked at and flushed. The patient's daughter was wondering if there was anything we can do to expedite the process in the ER. I told her patients are typically triaged based on acuity and I do believe she may be eligible for a higher acuity when they tell them that there is a clot in the inotrope line. I told her the other way patients are typically seen quickly is via EMS but she does not think her mother requires EMS care at this time. They will proceed to the ER. Dayna Dunn PA-C

## 2015-08-07 NOTE — ED Notes (Signed)
Pt here with malfunctioning PICC line that she receives milrinone through; pt sts malfunctioned today

## 2015-08-07 NOTE — ED Notes (Signed)
Discharge instructions reviewed with patient/family. Understanding verbalized. No distress noted.  

## 2015-08-07 NOTE — Discharge Instructions (Signed)
Follow up your regular doctors

## 2015-08-10 ENCOUNTER — Other Ambulatory Visit (HOSPITAL_COMMUNITY): Payer: Self-pay | Admitting: Internal Medicine

## 2015-08-14 ENCOUNTER — Other Ambulatory Visit (HOSPITAL_COMMUNITY): Payer: Self-pay | Admitting: *Deleted

## 2015-08-14 ENCOUNTER — Telehealth (HOSPITAL_COMMUNITY): Payer: Self-pay

## 2015-08-14 MED ORDER — ISOSORBIDE MONONITRATE ER 30 MG PO TB24: 15.0000 mg | ORAL_TABLET | Freq: Every day | ORAL | Status: DC

## 2015-08-14 NOTE — Telephone Encounter (Signed)
None needed

## 2015-08-14 NOTE — Telephone Encounter (Signed)
Joanna [name] from [facility] called and said that patient has had a congested cough for 1 week Daughter has been giving her OTC Robitussin Joanna [name] noted bi-basilar crackles this morning T 96.6 P 83 BP 106/84 Weight running between 130-132 lbs   Routing to Dr. Gala Romney

## 2015-08-14 NOTE — Telephone Encounter (Signed)
When new RX ordered will send to pharmacy and notify family to pick up

## 2015-08-15 ENCOUNTER — Telehealth: Payer: Self-pay | Admitting: Internal Medicine

## 2015-08-15 NOTE — Telephone Encounter (Signed)
Spoke w/pt's daughter, she states pt's wt is stable running 130-132, no edema no temp pt is coughing up phlegm (clear in color).  She states she gave pt some coricidin the beginning but she felt it made pt "loopy" so she has just been giving her Robitussin which she states helps a little but pt has used a whole bottle already and still has the cough.  Advised should see pcp since cough is continuing to linger and sx do not sound chf related.  She is agreeable and will call his office

## 2015-08-15 NOTE — Telephone Encounter (Signed)
Daughter called in and said that pt still have bad cough.  Does she need to come back in or is there something that needs to be called in?    Best number [phone]

## 2015-08-16 ENCOUNTER — Ambulatory Visit (INDEPENDENT_AMBULATORY_CARE_PROVIDER_SITE_OTHER): Payer: Medicare Other | Admitting: Internal Medicine

## 2015-08-16 ENCOUNTER — Telehealth: Payer: Self-pay | Admitting: Internal Medicine

## 2015-08-16 ENCOUNTER — Ambulatory Visit (INDEPENDENT_AMBULATORY_CARE_PROVIDER_SITE_OTHER)
Admission: RE | Admit: 2015-08-16 | Discharge: 2015-08-16 | Disposition: A | Discharge status: Home / Self Care | Payer: Medicare Other | Source: Ambulatory Visit | Attending: Internal Medicine | Admitting: Internal Medicine

## 2015-08-16 ENCOUNTER — Encounter: Payer: Self-pay | Admitting: Internal Medicine

## 2015-08-16 VITALS — BP 100/72 | HR 90 | Temp 98.6°F | Resp 16 | Ht 62.0 in | Wt 132.0 lb

## 2015-08-16 DIAGNOSIS — J948 Other specified pleural conditions: Secondary | ICD-10-CM

## 2015-08-16 DIAGNOSIS — J13 Pneumonia due to Streptococcus pneumoniae: Secondary | ICD-10-CM

## 2015-08-16 DIAGNOSIS — J189 Pneumonia, unspecified organism: Secondary | ICD-10-CM

## 2015-08-16 DIAGNOSIS — J181 Lobar pneumonia, unspecified organism: Secondary | ICD-10-CM

## 2015-08-16 DIAGNOSIS — I6523 Occlusion and stenosis of bilateral carotid arteries: Secondary | ICD-10-CM | POA: Diagnosis not present

## 2015-08-16 DIAGNOSIS — R05 Cough: Secondary | ICD-10-CM

## 2015-08-16 DIAGNOSIS — R059 Cough, unspecified: Secondary | ICD-10-CM

## 2015-08-16 DIAGNOSIS — J90 Pleural effusion, not elsewhere classified: Secondary | ICD-10-CM

## 2015-08-16 MED ORDER — AMOXICILLIN-POT CLAVULANATE 875-125 MG PO TABS: 1.0000 | ORAL_TABLET | Freq: Two times a day (BID) | ORAL | Status: DC

## 2015-08-16 MED ORDER — HYDROCODONE-HOMATROPINE 5-1.5 MG/5ML PO SYRP: 5.0000 mL | ORAL_SOLUTION | Freq: Three times a day (TID) | ORAL | Status: DC | PRN

## 2015-08-16 NOTE — Telephone Encounter (Signed)
pts daughter called back regarding this and I transferred her to team health line.

## 2015-08-16 NOTE — Patient Instructions (Signed)

## 2015-08-16 NOTE — Progress Notes (Signed)
Pre visit review using our clinic review tool, if applicable. No additional management support is needed unless otherwise documented below in the visit note. 

## 2015-08-16 NOTE — Telephone Encounter (Signed)
[facility] - Client TELEPHONE ADVICE RECORD [facility] Patient Name: Joanna [name] DOB: 1930-08-07 Initial Comment Caller states, mother has upper repertory issue for 4 weeks, has chest congestion w/ cough, trouble sleeping at night. Congestive heart failure patient. No trouble breathing. Nurse Assessment Nurse: Lane Hacker, RN, Elvin So Date/Time (Eastern Time): 08/16/2015 10:08:06 AM Confirm and document reason for call. If symptomatic, describe symptoms. ---Caller states mother has upper respiratory issues with chest congestion and dry and productive cough for last 4 weeks. No worsening SOB. Also trouble sleeping at night d/t a lot of coughing - more dry last night. Giving Robitussin DM every 4 hours thru the day. Would a stronger cough medicine be better and that would help her sleep at night? She's taking sips and fluids, and following diet closely with no added salt or processed foods. Weight up the last 2 days up 2 1/2 lbs. Given Metolazone (Zaroxolyn) yesterday early afternoon. Weight this AM = same as yesterday at 134.5 lb. -- Her nurse saw pt on Tuesday, and said that she could hear something in her lungs that did not clear with coughing. Thought it might be rales, but caller unsure. She is on cont. IV drip of Milrinone for CHF. Contacted cardiologist but told to f/u with PCP. Seen at hospital a week ago Tuesday since the IV drip has clogged up, and they didn't say anything about her lungs at that time. ---->Caller is not with patient and states that calling the patient she will tell you that "everything is fine." [phone] house phone. RN will call back in about 15-20 min. when caller is with her mother. Has the patient traveled out of the country within the last 30 days? ---Not Applicable Does the patient have any new or worsening symptoms? ---Yes Will a triage be completed? ---Yes Related visit to physician within the last 2 weeks?  ---Yes Does the PT have any chronic conditions? (i.e. diabetes, asthma, etc.) ---Yes List chronic conditions. ---CHF Guidelines Guideline Title Affirmed Question Affirmed Notes Cough - Chronic Change in color of sputum (e.g., from white to yellow-green sputum) Final Disposition User See Physician within 24 Hours [place], RN, [employer] with pt now. Pt agreed with report from her dtr ealier. Triage completed.  Comments RN advised that she make appt with PCP = set for 2 pm today. In the meantime, she can contact the cardiologist to see if he would rather see her. RN encouraged that she be seen within 4 hours because of likelihood that it could be r/t CHF. RN also advised that it may be something more because of the color change in sputum and would not prescribe stronger cough medicine w/o being seen. Caller verb. understanding. She plans to cancel appt with PCP with cardiologist can see pt. Referrals REFERRED TO PCP OFFICE Disagree/Comply: Comply

## 2015-08-16 NOTE — Progress Notes (Signed)
Subjective:  Patient ID: Joanna [name], female    DOB: 1929/10/29  Age: 81 y.o. MRN: [id number]  CC: Cough   HPI Joanna [name] presents for a cough productive of yellow phlegm for 3 weeks with night sweats.  Outpatient Prescriptions Prior to Visit  Medication Sig Dispense Refill  . acetaminophen (TYLENOL) 500 MG tablet Take 500 mg by mouth every 6 (six) hours as needed (pain).     Marland Kitchen amiodarone (PACERONE) 200 MG tablet Take 1 tablet (200 mg total) by mouth daily. 90 tablet 3  . apixaban (ELIQUIS) 2.5 MG TABS tablet Take 1 tablet (2.5 mg total) by mouth 2 (two) times daily. 180 tablet 3  . calcium citrate-vitamin D (CITRACAL+D) 315-200 MG-UNIT per tablet Take 1 tablet by mouth daily.    Marland Kitchen docusate sodium (COLACE) 100 MG capsule Take 100 mg by mouth 2 (two) times daily.      . ferrous sulfate 325 (65 FE) MG tablet Take 1 tablet (325 mg total) by mouth 2 (two) times daily with a meal. 180 tablet 1  . furosemide (LASIX) 80 MG tablet Take 1 tablet (80 mg total) by mouth daily. 30 tablet 6  . hydrALAZINE (APRESOLINE) 10 MG tablet Take 10 mg by mouth 2 (two) times daily.    . Insulin Lispro, Human, (HUMALOG KWIKPEN) 200 UNIT/ML SOPN Inject 0-9 Units into the skin 4 (four) times daily as needed. 9 mL 3  . Insulin Pen Needle (NOVOFINE) 32G X 6 MM MISC Use daily with insulin 300 each 3  . Insulin Pen Needle (NOVOFINE) 32G X 6 MM MISC 1 Act by Does not apply route daily. USE QID 400 each 3  . isosorbide mononitrate (IMDUR) 30 MG 24 hr tablet Take 0.5 tablets (15 mg total) by mouth daily. 45 tablet 6  . metolazone (ZAROXOLYN) 5 MG tablet Take 5 mg by mouth as needed (for weight 133 or greater).    . milrinone (PRIMACOR) 20 MG/100ML SOLN infusion Inject 15.475 mcg/min into the vein continuous. 100 mL 6  . potassium chloride SA (K-DUR,KLOR-CON) 20 MEQ tablet Take 2 tablets (40 mEq total) by mouth 2 (two) times daily. 120 tablet 0   No facility-administered medications prior to visit.     ROS Review of Systems  Constitutional: Positive for chills and diaphoresis. Negative for fever, activity change, appetite change, fatigue and unexpected weight change.  HENT: Negative.  Negative for facial swelling, sore throat, trouble swallowing and voice change.   Eyes: Negative.   Respiratory: Positive for cough. Negative for apnea, choking, shortness of breath, wheezing and stridor.   Cardiovascular: Negative.  Negative for chest pain, palpitations and leg swelling.  Gastrointestinal: Negative.  Negative for abdominal pain.  Endocrine: Negative.   Genitourinary: Negative.   Musculoskeletal: Negative.  Negative for back pain, arthralgias and neck pain.  Skin: Negative.  Negative for rash.  Allergic/Immunologic: Negative.   Neurological: Negative.  Negative for dizziness.  Hematological: Negative.  Negative for adenopathy. Does not bruise/bleed easily.  Psychiatric/Behavioral: Negative.     Objective:  BP 100/72 mmHg  Pulse 90  Temp(Src) 98.6 F (37 C) (Oral)  Resp 16  Ht 5\' 2"  (1.575 m)  Wt 132 lb (59.875 kg)  BMI 24.14 kg/m2  SpO2 95%  BP Readings from Last 3 Encounters:  08/16/15 100/72  08/07/15 117/92  08/02/15 118/68    Wt Readings from Last 3 Encounters:  08/16/15 132 lb (59.875 kg)  08/02/15 130 lb 8 oz (59.194 kg)  07/18/15 132 lb (  59.875 kg)    Physical Exam  Constitutional: She is oriented to person, place, and time. She appears well-developed and well-nourished.  Non-toxic appearance. She does not have a sickly appearance. She does not appear ill. No distress.  HENT:  Head: Normocephalic and atraumatic.  Mouth/Throat: Oropharynx is clear and moist. No oropharyngeal exudate.  Eyes: Conjunctivae are normal. Right eye exhibits no discharge. Left eye exhibits no discharge. No scleral icterus.  Neck: Normal range of motion. Neck supple. No JVD present. No tracheal deviation present. No thyromegaly present.  Cardiovascular: Normal rate, regular rhythm  and intact distal pulses.  Exam reveals gallop and S3. Exam reveals no friction rub.   Murmur heard.  Systolic murmur is present with a grade of 1/6  Pulmonary/Chest: Effort normal. No accessory muscle usage or stridor. No respiratory distress. She has no decreased breath sounds. She has no wheezes. She has rhonchi in the left lower field. She has no rales. She exhibits no tenderness.  Abdominal: Soft. Bowel sounds are normal. She exhibits no distension and no mass. There is no tenderness. There is no rebound and no guarding.  Musculoskeletal: Normal range of motion. She exhibits edema (trace edema in BLE). She exhibits no tenderness.  Lymphadenopathy:    She has no cervical adenopathy.  Neurological: She is oriented to person, place, and time.  Skin: Skin is warm and dry. No rash noted. She is not diaphoretic. No erythema. No pallor.    Lab Results  Component Value Date   WBC 9.1 07/18/2015   HGB 12.6 07/18/2015   HCT 37.8 07/18/2015   PLT 218.0 07/18/2015   GLUCOSE 198* 07/18/2015   CHOL 169 12/25/2014   TRIG 108.0 12/25/2014   HDL 54.70 12/25/2014   LDLDIRECT 120.6 11/28/2013   LDLCALC 93 12/25/2014   ALT 27 03/31/2015   AST 44* 03/31/2015   NA 141 07/18/2015   K 4.6 07/18/2015   CL 103 07/18/2015   CREATININE 2.05* 07/18/2015   BUN 78* 07/18/2015   CO2 30 07/18/2015   TSH 2.010 12/13/2014   INR 3.3 07/07/2014   HGBA1C 7.4* 07/18/2015   MICROALBUR 3.8* 12/25/2014    No results found.  Assessment & Plan:   Joanna [name] was seen today for cough.  Diagnoses and all orders for this visit:  Pleural effusion, left- this is caused by CHF, will recheck a plain xray today -     DG Chest 2 View; Future  Cough- Will look at na Xray for see if there is PNA, edema, mass -     DG Chest 2 View; Future -     HYDROcodone-homatropine (HYCODAN) 5-1.5 MG/5ML syrup; Take 5 mLs by mouth every 8 (eight) hours as needed for cough.  Pneumonia due to Streptococcus pneumoniae [facility])- will treat  the infection with augmentin and will control the cough with hycodan -     amoxicillin-clavulanate (AUGMENTIN) 875-125 MG tablet; Take 1 tablet by mouth 2 (two) times daily. -     DG Chest 2 View; Future -     HYDROcodone-homatropine (HYCODAN) 5-1.5 MG/5ML syrup; Take 5 mLs by mouth every 8 (eight) hours as needed for cough.  I am having Ms. Stay start on amoxicillin-clavulanate and HYDROcodone-homatropine. I am also having her maintain her docusate sodium, ferrous sulfate, calcium citrate-vitamin D, Insulin Pen Needle, milrinone, furosemide, Insulin Lispro, Insulin Pen Needle, acetaminophen, apixaban, hydrALAZINE, metolazone, amiodarone, potassium chloride SA, and isosorbide mononitrate.  Meds ordered this encounter  Medications  . amoxicillin-clavulanate (AUGMENTIN) 875-125 MG tablet  Sig: Take 1 tablet by mouth 2 (two) times daily.    Dispense:  20 tablet    Refill:  0  . HYDROcodone-homatropine (HYCODAN) 5-1.5 MG/5ML syrup    Sig: Take 5 mLs by mouth every 8 (eight) hours as needed for cough.    Dispense:  120 mL    Refill:  0     Follow-up: Return in about 3 weeks (around 09/06/2015).  Sanda Linger, MD

## 2015-08-16 NOTE — Telephone Encounter (Signed)
Returned patients daughter call.   Was told to keep patients PCP appointment at 2pm today so he would be able to evaluate her cough Reports T 97.5 Weight 134, took Metalazone yesterday Cough clear, Yellow productive one time. Cough 4 weeks Taking Robitussin without relief

## 2015-08-16 NOTE — Telephone Encounter (Signed)
Pt currently in for OV

## 2015-08-17 ENCOUNTER — Other Ambulatory Visit (HOSPITAL_COMMUNITY): Payer: Self-pay | Admitting: Cardiology

## 2015-08-22 ENCOUNTER — Telehealth: Payer: Self-pay | Admitting: Internal Medicine

## 2015-08-22 NOTE — Telephone Encounter (Signed)
pts daughter called back and I informed her the xray results and she is taking the antibiotics No need to call back

## 2015-08-28 ENCOUNTER — Other Ambulatory Visit (HOSPITAL_COMMUNITY): Payer: Self-pay | Admitting: Cardiology

## 2015-08-30 ENCOUNTER — Encounter (HOSPITAL_COMMUNITY): Payer: Self-pay

## 2015-08-30 ENCOUNTER — Ambulatory Visit (HOSPITAL_COMMUNITY)
Admission: RE | Admit: 2015-08-30 | Discharge: 2015-08-30 | Disposition: A | Discharge status: Home / Self Care | Payer: Medicare Other | Source: Ambulatory Visit | Attending: Cardiology | Admitting: Cardiology

## 2015-08-30 VITALS — BP 118/80 | HR 84 | Wt 134.2 lb

## 2015-08-30 DIAGNOSIS — Z794 Long term (current) use of insulin: Secondary | ICD-10-CM | POA: Insufficient documentation

## 2015-08-30 DIAGNOSIS — N189 Chronic kidney disease, unspecified: Secondary | ICD-10-CM | POA: Diagnosis not present

## 2015-08-30 DIAGNOSIS — I428 Other cardiomyopathies: Secondary | ICD-10-CM | POA: Diagnosis not present

## 2015-08-30 DIAGNOSIS — Z953 Presence of xenogenic heart valve: Secondary | ICD-10-CM | POA: Diagnosis not present

## 2015-08-30 DIAGNOSIS — D509 Iron deficiency anemia, unspecified: Secondary | ICD-10-CM | POA: Insufficient documentation

## 2015-08-30 DIAGNOSIS — I5022 Chronic systolic (congestive) heart failure: Secondary | ICD-10-CM | POA: Diagnosis present

## 2015-08-30 DIAGNOSIS — I130 Hypertensive heart and chronic kidney disease with heart failure and stage 1 through stage 4 chronic kidney disease, or unspecified chronic kidney disease: Secondary | ICD-10-CM | POA: Diagnosis not present

## 2015-08-30 DIAGNOSIS — I480 Paroxysmal atrial fibrillation: Secondary | ICD-10-CM | POA: Diagnosis not present

## 2015-08-30 DIAGNOSIS — I6521 Occlusion and stenosis of right carotid artery: Secondary | ICD-10-CM | POA: Diagnosis not present

## 2015-08-30 DIAGNOSIS — E1122 Type 2 diabetes mellitus with diabetic chronic kidney disease: Secondary | ICD-10-CM | POA: Diagnosis not present

## 2015-08-30 DIAGNOSIS — Z79899 Other long term (current) drug therapy: Secondary | ICD-10-CM | POA: Insufficient documentation

## 2015-08-30 DIAGNOSIS — E0822 Diabetes mellitus due to underlying condition with diabetic chronic kidney disease: Secondary | ICD-10-CM

## 2015-08-30 DIAGNOSIS — N184 Chronic kidney disease, stage 4 (severe): Secondary | ICD-10-CM | POA: Diagnosis not present

## 2015-08-30 DIAGNOSIS — Z7902 Long term (current) use of antithrombotics/antiplatelets: Secondary | ICD-10-CM | POA: Insufficient documentation

## 2015-08-30 DIAGNOSIS — E785 Hyperlipidemia, unspecified: Secondary | ICD-10-CM | POA: Insufficient documentation

## 2015-08-30 DIAGNOSIS — I739 Peripheral vascular disease, unspecified: Secondary | ICD-10-CM | POA: Diagnosis not present

## 2015-08-30 NOTE — Progress Notes (Addendum)
ADVANCED HF CLINIC NOTE  Patient ID: Joanna [name], female   DOB: [birth year], 81 y.o.   MRN: [id number]  PCP: Dr Yetta Barre ([facility] [place]) CVTS Surgeon: Dr. Cornelius Moras Primary Cardiologist: Dr. Gala Romney DNR/DNI   HPI: Joanna [name] is a 81 year old woman with aortic stenosis s/p AVR/mitral valve replacement (06/15/2013), systolic HF due to NICM, hypertension, diabetes mellitus type II, hyperlipidemia, and peripheral artery disease.  Underwent AVR/MVR with placement of epicardial lead on 06/15/13 with PAF post-op terminated with amiodarone.  Upgraded to [facility] Jude CRT-D 03/22/2015.    Admitted from 5/27 through 03/30/2015 with acute/chronic systolic HF and cardiogenic shock. She was diuresed with IV lasix and milrinone. Unable to wean milrinone and discharged on 0.25 mcg. While hospitalized CRT-D placed on 6/2. She was discharged to [facility].   Readmitted 6/11 with hypoglycemia. Also had sepsis with HCAP and received antibiotic course. She remained on milrinone 0.375 mcg. Discharge weight was 128 pounds.   She presents today for HF follow up. Overall feeling pretty good. Still getting over a cold. Mild dyspnea with exertion. Weight at home 130-134 pounds. Taking one metolazone every other week. Continues on milrinone with [facility] following. PICC site ok.  Taking medications. Ambulating with a walker. Living with her daughter.   05/19/2013 ECHO EF 35% severe AS (low gradient severe AS confirmed by DSE), gradient: 32 mm Hg (S). Peak gradient: 57mm Hg  08/18/13 ECHO EF 45-50% AV working well 2/16 ECHO with EF 20-25%, moderately dilated LV with mild LVH, normal bioprosthetic mitral and aortic valves, moderate TR, PA systolic pressure 54 mmHg.  4/16 ECHO EF 20-25%, diffuse hypokinesis, mild LVH, bioprosthetic aortic valve functioning normally, bioprosthetic mitral valve with mild central MR and mean gradient 4 mmHg.   Labs 9/14: K 5.4, creatinine 1.69 09/2013: K+ 4.6, creatinine 1.08, pro-BNP 11643 10/17/13: K+ 4.4,  creatinine 1.6 11/28/14: K+ 4.1, Creatinine 1.6 12/15/14: K 3.5, creatinine 1.72 4/16: K 4.2, creatinine 1.86, hgb 8.8 5/16: K 4 => 3.1, creatinine 1.68 => 1.61, BNP 4039 => 4185 04/09/2015: K 3.4 Creatinine 2.01  04/16/2915: K 4.2 Creatinine 1.8 Hgb 10.3  06/11/2015: K 4.1 Creatinine 1.9 07/24/15: K 4.5 Creatinine 1.8 WBC 7.2 08/21/2015: K 3.9 Creatinine 1.7    ROS:  12-point review of systems went over in clinic and all negative except as described in HPI and problem list.  Past Medical History  Diagnosis Date  . Peripheral artery disease (HCC)   . Hyperlipidemia        . Arthritis   . Aortic stenosis      a. s/p AVR  . BUNDLE BRANCH BLOCK, LEFT    . CAROTID ARTERY STENOSIS 01/05/2009  . CVA 03/14/2010  . PERIPHERAL VASCULAR DISEASE 07/30/2007  . Chronic combined systolic and diastolic CHF (congestive heart failure) (HCC)     a. RHC 09/2011 RA 8, RV 58/2/11; PA 54/22 (37) PCWP 20; F CO/CI 4.57/2.67 PVR 3.7; b. L main no sig dz, LAD luminal irreg; LCx lg Ramus with luminal irreg, small AV Lcx witout sig dz, RCA luminal irreg; severe mitral annular calcifications; AV calcified c. EF 45-50% (07/2013);  d. 11/2014 Echo: EF 20-25%, diff HK, nl AV/MV, sev dil LA, mod TR/PR, PASP .  . Diabetes mellitus   . HTN (hypertension)   . Iron deficiency anemia 09/21/2011  . History of shingles   . Mitral regurgitation     a. s/p MVR  . S/P aortic valve replacement with bioprosthetic valve 06/15/2013    21 mm Randa Evens  Magna Ease bovine pericardial tissue valve  . S/P mitral valve replacement with bioprosthetic valve 06/15/2013    25 mm [facility] Mitral bovine pericardial tissue valve  . Fracture, fibula, shaft 01/30/2015    right     Current Outpatient Prescriptions  Medication Sig Dispense Refill  . acetaminophen (TYLENOL) 500 MG tablet Take 500 mg by mouth every 6 (six) hours as needed (pain).     Marland Kitchen amiodarone (PACERONE) 200 MG tablet Take 1 tablet (200 mg total) by mouth daily. 90 tablet 3   . apixaban (ELIQUIS) 2.5 MG TABS tablet Take 1 tablet (2.5 mg total) by mouth 2 (two) times daily. 180 tablet 3  . calcium citrate-vitamin D (CITRACAL+D) 315-200 MG-UNIT per tablet Take 1 tablet by mouth daily.    Marland Kitchen docusate sodium (COLACE) 100 MG capsule Take 100 mg by mouth 2 (two) times daily.      . ferrous sulfate 325 (65 FE) MG tablet Take 1 tablet (325 mg total) by mouth 2 (two) times daily with a meal. 180 tablet 1  . furosemide (LASIX) 80 MG tablet Take 1 tablet (80 mg total) by mouth daily. 30 tablet 6  . hydrALAZINE (APRESOLINE) 10 MG tablet Take 10 mg by mouth 2 (two) times daily.    Marland Kitchen HYDROcodone-homatropine (HYCODAN) 5-1.5 MG/5ML syrup Take 5 mLs by mouth every 8 (eight) hours as needed for cough. 120 mL 0  . Insulin Lispro, Human, (HUMALOG KWIKPEN) 200 UNIT/ML SOPN Inject 0-9 Units into the skin 4 (four) times daily as needed. 9 mL 3  . Insulin Pen Needle (NOVOFINE) 32G X 6 MM MISC Use daily with insulin 300 each 3  . Insulin Pen Needle (NOVOFINE) 32G X 6 MM MISC 1 Act by Does not apply route daily. USE QID 400 each 3  . isosorbide mononitrate (IMDUR) 30 MG 24 hr tablet Take 0.5 tablets (15 mg total) by mouth daily. 45 tablet 6  . metolazone (ZAROXOLYN) 5 MG tablet Take 5 mg by mouth as needed (for weight 133 or greater).    . milrinone (PRIMACOR) 20 MG/100ML SOLN infusion Inject 15.475 mcg/min into the vein continuous. 100 mL 6  . potassium chloride SA (K-DUR,KLOR-CON) 20 MEQ tablet Take 2 tablets (40 mEq total) by mouth 2 (two) times daily. 120 tablet 0   No current facility-administered medications for this encounter.   Social History   Social History  . Marital Status: Divorced    Spouse Name: N/A  . Number of Children: N/A  . Years of Education: N/A   Social History Main Topics  . Smoking status: Never Smoker   . Smokeless tobacco: Never Used  . Alcohol Use: No  . Drug Use: No  . Sexual Activity: No   Other Topics Concern  . None   Social History Narrative    Patient lives alone here in town   She continues to work, helping to bind and oversew books   She is divorced after 23 years of marriage   1 son- '61, minister; 1 dtr '55; 4 grandchildren   End of life care-provided packet on living well   Family History  Problem Relation Age of Onset  . Colon cancer Mother   . Diabetes Neg Hx   . Coronary artery disease Neg Hx     Filed Vitals:   08/30/15 1020  BP: 118/80  Pulse: 84  Weight: 134 lb 4 oz (60.895 kg)  SpO2: 98%    PHYSICAL EXAM: General: Elderly. NAD; Daughter present. Ambulated in  the clinic.  HEENT: normal Neck: supple. JVP 6-7 . Carotids 2+ bilaterally; no bruits. No lymphadenopathy or thryomegaly appreciated. Cor: PMI normal. Regular rate & rhythm. 1/6 SEM. +S3.    Lungs: CTA  Abdomen: soft, nontender, nondistended. No hepatosplenomegaly. No bruits or masses. Good bowel sounds. Extremities: no cyanosis, clubbing, rash.  RLE and LLE trace edema. RUE PICC. Neuro: alert & orientedx3, cranial nerves grossly intact. Moves all 4 extremities w/o difficulty. Affect pleasant  ASSESSMENT & PLAN: 1. Chronic systolic CHF: EF 04-54% (4/16).  Nonischemic cardiomyopathy.  On chronic milrinone 0.25 mcg. Continue weeklly lab work.  - NYHA II-III on milrinone. Volume status stable. Continue lasix 80 mg daily --Continue metolazone 2.5 mg with 20 KCL as needed for weights above 131 pounds . He daughter is able to adjust diuretics as needed.  --Continue current dose of hydralazine/imdur. No Arb with CKD.    2. Carotid stenosis: 40-59% RICA stenosis stable 3. Hyperlipidemia: LDL 93 in 3/16, would not change statin.  4.  Aortic stenosis/mitral regurgitation: s/p bioprosthetic AVR/MVR.   5. CKD: follow BMET weekly 6. PAF:  No evidence of recurrence.Continue apixaban 2.5 mg twice a day. . Continue amio 200 mg daily.  7. St Jude CRT-D -Followed by EP    8. DM - Insulin dependent. Followed by PCP. Requesting referral to [facility].   Follow up in 4 weeks.  Tonye Becket, NP-C  08/30/2015

## 2015-08-30 NOTE — Patient Instructions (Signed)
Continue current medications  Follow up in 4 weeks with Amy

## 2015-09-03 ENCOUNTER — Telehealth (HOSPITAL_COMMUNITY): Payer: Self-pay | Admitting: Pharmacist

## 2015-09-03 NOTE — Telephone Encounter (Signed)
Eliquis appeal DENIED 09/03/2015.   Tyler Deis. Bonnye Fava, PharmD, BCPS, CPP Clinical Pharmacist Pager: [phone] Phone: [phone] 09/03/2015 3:25 PM

## 2015-09-04 ENCOUNTER — Encounter: Payer: Self-pay | Admitting: Adult Health

## 2015-09-06 ENCOUNTER — Telehealth (HOSPITAL_COMMUNITY): Payer: Self-pay

## 2015-09-06 NOTE — Telephone Encounter (Signed)
Returned call to Daughter Joanna [name] Reports last night she had trouble breathing Weighed her mother today and weight was up:   11/16 130.5  11/17 139.5 Daughter also noted her abdominal girth was larger today because it was more difficult to tighten the belt for her drip  She knows she should take an extra Metolazone, but she felt this was a such a dramatic increase her daughter wants to know if there are further directions. Had not given her a Metolazone yet.  Instructed her daughter Joanna [name] to give the Metolozone 5 mg  Routing to Dr. Gala Romney and Tonye Becket NP-C for any further instructions

## 2015-09-06 NOTE — Telephone Encounter (Signed)
Please ask her to double lasix to 80 mg twice a day the next 2 days. Also take metolazone today.   Please call her .  Thanks Alaycia Eardley

## 2015-09-07 ENCOUNTER — Other Ambulatory Visit (HOSPITAL_COMMUNITY): Payer: Self-pay | Admitting: Cardiology

## 2015-09-07 NOTE — Telephone Encounter (Signed)
Claris Che, patients daughter to give her the instructions from Any Clegg NP-C. Daughter reports weight is down to 132.5 this morning after giving her a Metolazone yesterday. Suggested to [name] that if the weight starts go go up again over the weekend that Amy did order her mother to have lasix 80 mg for two days.

## 2015-09-17 ENCOUNTER — Other Ambulatory Visit (HOSPITAL_COMMUNITY): Payer: Self-pay | Admitting: Internal Medicine

## 2015-09-18 ENCOUNTER — Telehealth (HOSPITAL_COMMUNITY): Payer: Self-pay | Admitting: Student

## 2015-09-18 NOTE — Telephone Encounter (Signed)
Spoke with daughter concerning labs with Creatinine 2.16 (slightly up from previous)  Pt has not required extra lasix or metolazone for at least 2 weeks.  Weight stable between 128-132 at home, 132 this morning. Notably, weight stable over thanksgiving. Pt does very occasionally get lightheaded, but has not been worse recently.  Instructed daughter to continue current lasix, but hold if weight drops below 128 or if she begins to get dizzy/lightheaded with standing.   Daughter verbalizes understanding and confirms follow up appointment for next week.  We will follow up with her labs next week.  Casimiro Needle [address]" Numa, PA-C 09/18/2015 3:51 PM

## 2015-09-20 ENCOUNTER — Encounter: Payer: Medicare Other | Admitting: *Deleted

## 2015-09-20 ENCOUNTER — Telehealth: Payer: Self-pay | Admitting: Cardiology

## 2015-09-20 NOTE — Telephone Encounter (Signed)
Confirmed remote transmission w/ pt daughter.   

## 2015-09-24 ENCOUNTER — Encounter: Payer: Self-pay | Admitting: Internal Medicine

## 2015-09-24 ENCOUNTER — Encounter (HOSPITAL_COMMUNITY): Payer: Self-pay | Admitting: *Deleted

## 2015-09-24 ENCOUNTER — Inpatient Hospital Stay (HOSPITAL_COMMUNITY): Payer: Medicare Other

## 2015-09-24 ENCOUNTER — Observation Stay (HOSPITAL_COMMUNITY)
Admission: EM | Admit: 2015-09-24 | Discharge: 2015-09-27 | Disposition: A | Discharge status: Skilled Nursing Facility | Payer: Medicare Other | Attending: Internal Medicine | Admitting: Internal Medicine

## 2015-09-24 ENCOUNTER — Emergency Department (HOSPITAL_COMMUNITY): Payer: Medicare Other

## 2015-09-24 DIAGNOSIS — Z953 Presence of xenogenic heart valve: Secondary | ICD-10-CM

## 2015-09-24 DIAGNOSIS — E1122 Type 2 diabetes mellitus with diabetic chronic kidney disease: Secondary | ICD-10-CM | POA: Diagnosis not present

## 2015-09-24 DIAGNOSIS — S32592A Other specified fracture of left pubis, initial encounter for closed fracture: Principal | ICD-10-CM | POA: Insufficient documentation

## 2015-09-24 DIAGNOSIS — I5022 Chronic systolic (congestive) heart failure: Secondary | ICD-10-CM | POA: Diagnosis not present

## 2015-09-24 DIAGNOSIS — I10 Essential (primary) hypertension: Secondary | ICD-10-CM

## 2015-09-24 DIAGNOSIS — R269 Unspecified abnormalities of gait and mobility: Secondary | ICD-10-CM | POA: Diagnosis not present

## 2015-09-24 DIAGNOSIS — E876 Hypokalemia: Secondary | ICD-10-CM | POA: Insufficient documentation

## 2015-09-24 DIAGNOSIS — E0821 Diabetes mellitus due to underlying condition with diabetic nephropathy: Secondary | ICD-10-CM

## 2015-09-24 DIAGNOSIS — Z7901 Long term (current) use of anticoagulants: Secondary | ICD-10-CM | POA: Diagnosis not present

## 2015-09-24 DIAGNOSIS — I481 Persistent atrial fibrillation: Secondary | ICD-10-CM | POA: Diagnosis not present

## 2015-09-24 DIAGNOSIS — I5023 Acute on chronic systolic (congestive) heart failure: Secondary | ICD-10-CM | POA: Diagnosis present

## 2015-09-24 DIAGNOSIS — I129 Hypertensive chronic kidney disease with stage 1 through stage 4 chronic kidney disease, or unspecified chronic kidney disease: Secondary | ICD-10-CM | POA: Insufficient documentation

## 2015-09-24 DIAGNOSIS — N184 Chronic kidney disease, stage 4 (severe): Secondary | ICD-10-CM | POA: Diagnosis not present

## 2015-09-24 DIAGNOSIS — Z794 Long term (current) use of insulin: Secondary | ICD-10-CM | POA: Diagnosis not present

## 2015-09-24 DIAGNOSIS — E1129 Type 2 diabetes mellitus with other diabetic kidney complication: Secondary | ICD-10-CM | POA: Diagnosis present

## 2015-09-24 DIAGNOSIS — Z952 Presence of prosthetic heart valve: Secondary | ICD-10-CM | POA: Insufficient documentation

## 2015-09-24 DIAGNOSIS — Z8673 Personal history of transient ischemic attack (TIA), and cerebral infarction without residual deficits: Secondary | ICD-10-CM | POA: Insufficient documentation

## 2015-09-24 DIAGNOSIS — S329XXA Fracture of unspecified parts of lumbosacral spine and pelvis, initial encounter for closed fracture: Secondary | ICD-10-CM | POA: Diagnosis present

## 2015-09-24 DIAGNOSIS — M25552 Pain in left hip: Secondary | ICD-10-CM

## 2015-09-24 DIAGNOSIS — W19XXXA Unspecified fall, initial encounter: Secondary | ICD-10-CM

## 2015-09-24 DIAGNOSIS — Z79899 Other long term (current) drug therapy: Secondary | ICD-10-CM | POA: Insufficient documentation

## 2015-09-24 DIAGNOSIS — S3289XD Fracture of other parts of pelvis, subsequent encounter for fracture with routine healing: Secondary | ICD-10-CM | POA: Diagnosis not present

## 2015-09-24 DIAGNOSIS — E0822 Diabetes mellitus due to underlying condition with diabetic chronic kidney disease: Secondary | ICD-10-CM | POA: Diagnosis not present

## 2015-09-24 DIAGNOSIS — E785 Hyperlipidemia, unspecified: Secondary | ICD-10-CM | POA: Insufficient documentation

## 2015-09-24 DIAGNOSIS — M79609 Pain in unspecified limb: Secondary | ICD-10-CM | POA: Diagnosis not present

## 2015-09-24 DIAGNOSIS — S32599A Other specified fracture of unspecified pubis, initial encounter for closed fracture: Secondary | ICD-10-CM | POA: Diagnosis present

## 2015-09-24 LAB — CBC WITH DIFFERENTIAL/PLATELET
BASOS ABS: 0 10*3/uL (ref 0.0–0.1)
BASOS PCT: 0 %
EOS ABS: 0.1 10*3/uL (ref 0.0–0.7)
EOS PCT: 1 %
HCT: 35.4 % — ABNORMAL LOW (ref 36.0–46.0)
Hemoglobin: 11.4 g/dL — ABNORMAL LOW (ref 12.0–15.0)
LYMPHS ABS: 0.6 10*3/uL — AB (ref 0.7–4.0)
LYMPHS PCT: 5 %
MCH: 30.6 pg (ref 26.0–34.0)
MCHC: 32.2 g/dL (ref 30.0–36.0)
MCV: 94.9 fL (ref 78.0–100.0)
MONO ABS: 0.6 10*3/uL (ref 0.1–1.0)
Monocytes Relative: 5 %
Neutro Abs: 10.6 10*3/uL — ABNORMAL HIGH (ref 1.7–7.7)
Neutrophils Relative %: 89 %
PLATELETS: 168 10*3/uL (ref 150–400)
RBC: 3.73 MIL/uL — AB (ref 3.87–5.11)
RDW: 15.2 % (ref 11.5–15.5)
WBC: 11.9 10*3/uL — AB (ref 4.0–10.5)

## 2015-09-24 LAB — GLUCOSE, CAPILLARY: GLUCOSE-CAPILLARY: 225 mg/dL — AB (ref 65–99)

## 2015-09-24 LAB — BASIC METABOLIC PANEL
ANION GAP: 10 (ref 5–15)
BUN: 56 mg/dL — ABNORMAL HIGH (ref 6–20)
CALCIUM: 9.5 mg/dL (ref 8.9–10.3)
CO2: 27 mmol/L (ref 22–32)
Chloride: 99 mmol/L — ABNORMAL LOW (ref 101–111)
Creatinine, Ser: 1.83 mg/dL — ABNORMAL HIGH (ref 0.44–1.00)
GFR, EST AFRICAN AMERICAN: 28 mL/min — AB (ref >60)
GFR, EST NON AFRICAN AMERICAN: 24 mL/min — AB (ref >60)
Glucose, Bld: 257 mg/dL — ABNORMAL HIGH (ref 65–99)
POTASSIUM: 3.4 mmol/L — AB (ref 3.5–5.1)
SODIUM: 136 mmol/L (ref 135–145)

## 2015-09-24 MED ORDER — ACETAMINOPHEN 500 MG PO TABS
500.0000 mg | ORAL_TABLET | Freq: Once | ORAL | Status: AC
  Administered 2015-09-24: 500 mg via ORAL
  Filled 2015-09-24: qty 1

## 2015-09-24 MED ORDER — METOLAZONE 2.5 MG PO TABS: 2.5000 mg | ORAL_TABLET | Freq: Every day | ORAL | Status: DC | PRN

## 2015-09-24 MED ORDER — FERROUS SULFATE 325 (65 FE) MG PO TABS
325.0000 mg | ORAL_TABLET | Freq: Two times a day (BID) | ORAL | Status: DC
  Administered 2015-09-25 (×2): 325 mg via ORAL
  Filled 2015-09-24 (×2): qty 1

## 2015-09-24 MED ORDER — METOLAZONE 5 MG PO TABS: 5.0000 mg | ORAL_TABLET | ORAL | Status: DC | PRN

## 2015-09-24 MED ORDER — ONDANSETRON HCL 4 MG/2ML IJ SOLN: 4.0000 mg | Freq: Four times a day (QID) | INTRAMUSCULAR | Status: DC | PRN

## 2015-09-24 MED ORDER — MILRINONE IN DEXTROSE 20 MG/100ML IV SOLN
0.1548 ug/kg/min | INTRAVENOUS | Status: DC
  Filled 2015-09-24: qty 100

## 2015-09-24 MED ORDER — CALCIUM CITRATE-VITAMIN D 315-200 MG-UNIT PO TABS: 1.0000 | ORAL_TABLET | Freq: Every day | ORAL | Status: DC

## 2015-09-24 MED ORDER — ISOSORBIDE MONONITRATE ER 30 MG PO TB24
15.0000 mg | ORAL_TABLET | Freq: Every day | ORAL | Status: DC
  Administered 2015-09-25 – 2015-09-27 (×3): 15 mg via ORAL
  Filled 2015-09-24 (×3): qty 1

## 2015-09-24 MED ORDER — ACETAMINOPHEN 500 MG PO TABS
500.0000 mg | ORAL_TABLET | Freq: Four times a day (QID) | ORAL | Status: DC | PRN
  Administered 2015-09-25: 500 mg via ORAL
  Filled 2015-09-24: qty 1

## 2015-09-24 MED ORDER — INSULIN ASPART 100 UNIT/ML ~~LOC~~ SOLN
0.0000 [IU] | SUBCUTANEOUS | Status: DC
  Administered 2015-09-24: 3 [IU] via SUBCUTANEOUS
  Administered 2015-09-25: 7 [IU] via SUBCUTANEOUS
  Administered 2015-09-25: 3 [IU] via SUBCUTANEOUS
  Administered 2015-09-25: 2 [IU] via SUBCUTANEOUS
  Administered 2015-09-25: 5 [IU] via SUBCUTANEOUS
  Administered 2015-09-25: 3 [IU] via SUBCUTANEOUS
  Administered 2015-09-26: 2 [IU] via SUBCUTANEOUS
  Administered 2015-09-26: 3 [IU] via SUBCUTANEOUS
  Administered 2015-09-26: 2 [IU] via SUBCUTANEOUS
  Administered 2015-09-26: 3 [IU] via SUBCUTANEOUS
  Administered 2015-09-26: 5 [IU] via SUBCUTANEOUS

## 2015-09-24 MED ORDER — HYDRALAZINE HCL 10 MG PO TABS
10.0000 mg | ORAL_TABLET | Freq: Two times a day (BID) | ORAL | Status: DC
  Administered 2015-09-24 – 2015-09-27 (×6): 10 mg via ORAL
  Filled 2015-09-24 (×5): qty 1

## 2015-09-24 MED ORDER — AMIODARONE HCL 200 MG PO TABS
200.0000 mg | ORAL_TABLET | Freq: Every day | ORAL | Status: DC
  Administered 2015-09-25 – 2015-09-27 (×3): 200 mg via ORAL
  Filled 2015-09-24 (×3): qty 1

## 2015-09-24 MED ORDER — MILRINONE IN DEXTROSE 20 MG/100ML IV SOLN
0.2500 ug/kg/min | INTRAVENOUS | Status: DC
  Administered 2015-09-25 – 2015-09-26 (×3): 0.25 ug/kg/min via INTRAVENOUS
  Filled 2015-09-24 (×2): qty 100

## 2015-09-24 MED ORDER — POTASSIUM CHLORIDE CRYS ER 20 MEQ PO TBCR
40.0000 meq | EXTENDED_RELEASE_TABLET | Freq: Two times a day (BID) | ORAL | Status: DC
  Administered 2015-09-24 – 2015-09-27 (×6): 40 meq via ORAL
  Filled 2015-09-24 (×7): qty 2

## 2015-09-24 MED ORDER — ONDANSETRON HCL 4 MG PO TABS
4.0000 mg | ORAL_TABLET | Freq: Four times a day (QID) | ORAL | Status: DC | PRN
Start: 2015-09-24 — End: 2015-09-27
  Filled 2015-09-24 (×2): qty 1

## 2015-09-24 MED ORDER — APIXABAN 2.5 MG PO TABS
2.5000 mg | ORAL_TABLET | Freq: Two times a day (BID) | ORAL | Status: DC
  Administered 2015-09-24 – 2015-09-27 (×6): 2.5 mg via ORAL
  Filled 2015-09-24 (×6): qty 1

## 2015-09-24 MED ORDER — HYDROCODONE-ACETAMINOPHEN 5-325 MG PO TABS
0.5000 | ORAL_TABLET | ORAL | Status: DC | PRN
  Administered 2015-09-24: 0.5 via ORAL
  Administered 2015-09-25 – 2015-09-27 (×6): 1 via ORAL
  Filled 2015-09-24 (×7): qty 1

## 2015-09-24 MED ORDER — FUROSEMIDE 80 MG PO TABS
80.0000 mg | ORAL_TABLET | Freq: Every day | ORAL | Status: DC
  Administered 2015-09-25 – 2015-09-27 (×3): 80 mg via ORAL
  Filled 2015-09-24 (×3): qty 1

## 2015-09-24 MED ORDER — HYDROCODONE-HOMATROPINE 5-1.5 MG/5ML PO SYRP: 5.0000 mL | ORAL_SOLUTION | Freq: Three times a day (TID) | ORAL | Status: DC | PRN

## 2015-09-24 MED ORDER — CALCIUM CARBONATE-VITAMIN D 500-200 MG-UNIT PO TABS
1.0000 | ORAL_TABLET | Freq: Every day | ORAL | Status: DC
  Administered 2015-09-25: 1 via ORAL
  Filled 2015-09-24: qty 1

## 2015-09-24 MED ORDER — DOCUSATE SODIUM 100 MG PO CAPS
100.0000 mg | ORAL_CAPSULE | Freq: Two times a day (BID) | ORAL | Status: DC
  Administered 2015-09-24 – 2015-09-27 (×6): 100 mg via ORAL
  Filled 2015-09-24 (×6): qty 1

## 2015-09-24 NOTE — Consult Note (Addendum)
ORTHOPAEDIC CONSULTATION  REQUESTING PHYSICIAN: Courteney Lyn Mackuen, MD  Chief Complaint: fall onto left side  HPI: Joanna [name] is a 81 y.o. female who complains of a mechanical fall yesterday and left hip pain. She ambulates with a cane normally  Past Medical History  Diagnosis Date  . Peripheral artery disease ([place])   . Hyperlipidemia        . Arthritis   . Aortic stenosis      a. s/p AVR  . BUNDLE BRANCH BLOCK, LEFT    . CAROTID ARTERY STENOSIS 01/05/2009  . CVA 03/14/2010  . PERIPHERAL VASCULAR DISEASE 07/30/2007  . Chronic combined systolic and diastolic CHF (congestive heart failure) ([place])     a. [place] 09/2011 RA 8, RV 58/2/11; PA 54/22 (37) PCWP 20; F CO/CI 4.57/2.67 PVR 3.7; b. L main no sig dz, LAD luminal irreg; LCx lg Ramus with luminal irreg, small AV Lcx witout sig dz, RCA luminal irreg; severe mitral annular calcifications; AV calcified c. EF 45-50% (07/2013);  d. 11/2014 Echo: EF 20-25%, diff HK, nl AV/MV, sev dil LA, mod TR/PR, PASP 49mHg.  . Diabetes mellitus   . HTN (hypertension)   . Iron deficiency anemia 09/21/2011  . History of shingles   . Mitral regurgitation     a. s/p MVR  . S/P aortic valve replacement with bioprosthetic valve 06/15/2013    [facility]  . S/P mitral valve replacement with bioprosthetic valve 06/15/2013    [facility]  . Fracture, fibula, shaft 01/30/2015    right   Past Surgical History  Procedure Laterality Date  . Carotid endarterectomy Left 2011  . Polypectomy  12/27/04  . Cataract extraction Bilateral 2009  . Tee without cardioversion N/A 05/19/2013    Procedure: TRANSESOPHAGEAL ECHOCARDIOGRAM (TEE);  Surgeon: DLarey Dresser MD;  [place]  Service: Cardiovascular;  Laterality: N/A;  . Colonoscopy    . Cardiac catheterization  2012  . Aortic valve replacement N/A 06/15/2013    Procedure: AORTIC VALVE REPLACEMENT  (AVR);  Surgeon: CRexene Alberts MD;  [place]  Service: Open Heart Surgery;  Laterality: N/A;  . Intraoperative transesophageal echocardiogram N/A 06/15/2013    Procedure: INTRAOPERATIVE TRANSESOPHAGEAL ECHOCARDIOGRAM;  Surgeon: CRexene Alberts MD;  [place] [facility] [place]  Service: Open Heart Surgery;  Laterality: N/A;  . Mitral valve replacement N/A 06/15/2013    Procedure: MITRAL VALVE (MV) REPLACEMENT;  Surgeon: CRexene Alberts MD;  [place] [facility]  Service: Open Heart Surgery;  Laterality: N/A;  . Epicardial pacing lead placement N/A 06/15/2013    Procedure: EPICARDIAL PACING LEAD PLACEMENT;  Surgeon: CRexene Alberts MD;  [place]  Service: Open Heart Surgery;  Laterality: N/A;  . Left and right heart catheterization with coronary angiogram N/A 09/19/2011    Procedure: LEFT AND RIGHT HEART CATHETERIZATION WITH CORONARY ANGIOGRAM;  Surgeon: DJolaine Artist MD;  [facility];  Service: Cardiovascular;  Laterality: N/A;  . Orif ankle fracture Right 01/31/2015    Procedure: OPEN REDUCTION INTERNAL FIXATION (ORIF) ANKLE FRACTURE;  Surgeon: MMarybelle Killings MD;  [place]  Service: Orthopedics;  Laterality: Right;  . Ep implantable device N/A 03/21/2015    Procedure: BiV Pacemaker Insertion CRT-P;  Surgeon: SDeboraha Sprang MD;  [facility];  Service: Cardiovascular;  Laterality: N/A;  . Pacemaker insertion     Social History  Social History  . Marital Status: Divorced    Spouse Name: N/A  . Number of Children: N/A  . Years of Education: N/A   Social History Main Topics  . Smoking status: Never Smoker   . Smokeless tobacco: Never Used  . Alcohol Use: No  . Drug Use: No  . Sexual Activity: No   Other Topics Concern  . None   Social History Narrative   Patient lives alone here in town   She continues to work, helping to bind and oversew books   She is divorced after 70 years of marriage   1 son- '61, minister; 1 dtr '55; 4 grandchildren    End of life care-provided packet on living well   Family History  Problem Relation Age of Onset  . Colon cancer Mother   . Diabetes Neg Hx   . Coronary artery disease Neg Hx    Allergies  Allergen Reactions  . Other Hives    STEROIDS  . Prednisone Hives and Other (See Comments)    She is allergic to all steroids!   Prior to Admission medications   Medication Sig Start Date End Date Taking? Authorizing Provider  acetaminophen (TYLENOL) 500 MG tablet Take 500 mg by mouth every 6 (six) hours as needed (pain).     Historical Provider, MD  amiodarone (PACERONE) 200 MG tablet Take 1 tablet (200 mg total) by mouth daily. 06/28/15   Jolaine Artist, MD  apixaban (ELIQUIS) 2.5 MG TABS tablet Take 1 tablet (2.5 mg total) by mouth 2 (two) times daily. 06/21/15   Larey Dresser, MD  calcium citrate-vitamin D (CITRACAL+D) 315-200 MG-UNIT per tablet Take 1 tablet by mouth daily.    Historical Provider, MD  docusate sodium (COLACE) 100 MG capsule Take 100 mg by mouth 2 (two) times daily.      Historical Provider, MD  ferrous sulfate 325 (65 FE) MG tablet Take 1 tablet (325 mg total) by mouth 2 (two) times daily with a meal. 02/28/14   Janith Lima, MD  furosemide (LASIX) 80 MG tablet Take 1 tablet (80 mg total) by mouth daily. 04/18/15   Larey Dresser, MD  hydrALAZINE (APRESOLINE) 10 MG tablet Take 10 mg by mouth 2 (two) times daily.    Historical Provider, MD  HYDROcodone-homatropine (HYCODAN) 5-1.5 MG/5ML syrup Take 5 mLs by mouth every 8 (eight) hours as needed for cough. 08/16/15   Janith Lima, MD  Insulin Lispro, Human, (HUMALOG KWIKPEN) 200 UNIT/ML SOPN Inject 0-9 Units into the skin 4 (four) times daily as needed. 06/05/15   Janith Lima, MD  Insulin Pen Needle (NOVOFINE) 32G X 6 MM MISC Use daily with insulin 12/29/14   Janith Lima, MD  Insulin Pen Needle (NOVOFINE) 32G X 6 MM MISC 1 Act by Does not apply route daily. USE QID 06/05/15   Janith Lima, MD  isosorbide mononitrate (IMDUR)  30 MG 24 hr tablet Take 0.5 tablets (15 mg total) by mouth daily. 08/14/15   Jolaine Artist, MD  metolazone (ZAROXOLYN) 2.5 MG tablet Take 1 tablet by mouth  daily as needed for more  than 3 pound weight gain in 1 day or 5 pounds in a week 09/17/15   Jolaine Artist, MD  metolazone (ZAROXOLYN) 5 MG tablet Take 5 mg by mouth as needed (for weight 133 or greater).    Historical Provider, MD  milrinone (PRIMACOR) 20 MG/100ML SOLN infusion Inject 15.475 mcg/min into the vein continuous. 04/05/15  Satira Mccallum Tillery, PA-C  potassium chloride SA (K-DUR,KLOR-CON) 20 MEQ tablet Take 2 tablets (40 mEq total) by mouth 2 (two) times daily. 07/10/15   Shirley Friar, PA-C   Dg Pelvis 1-2 Views  09/24/2015  ADDENDUM REPORT: 09/24/2015 16:44 ADDENDUM: Upon further review, there are left superior and inferior pubic rami fractures noted. Mild displacement of the superior pubic ramus fracture near the pubic symphysis. Electronically Signed   By: Rolm Baptise M.D.   On: 09/24/2015 16:44  09/24/2015  CLINICAL DATA:  Fall today.  Left hip pain EXAM: PELVIS - 1-2 VIEW COMPARISON:  None. FINDINGS: Advanced degenerative changes in the hips bilaterally. SI joints are symmetric and unremarkable. No acute bony abnormality. Specifically, no fracture, subluxation, or dislocation. Soft tissues are intact. IMPRESSION: No acute bony abnormality. Electronically Signed: By: Rolm Baptise M.D. On: 09/24/2015 16:25    Positive ROS: All other systems have been reviewed and were otherwise negative with the exception of those mentioned in the HPI and as above.  Labs cbc No results for input(s): WBC, HGB, HCT, PLT in the last 72 hours.  Labs inflam No results for input(s): CRP in the last 72 hours.  Invalid input(s): ESR  Labs coag No results for input(s): INR, PTT in the last 72 hours.  Invalid input(s): PT  No results for input(s): NA, K, CL, CO2, GLUCOSE, BUN, CREATININE, CALCIUM in the last 72  hours.  Physical Exam: Filed Vitals:   09/24/15 1531  BP: 108/71  Pulse: 86  Temp: 98.6 F (37 C)  Resp: 16   General: Alert, no acute distress Cardiovascular: No pedal edema Respiratory: No cyanosis, no use of accessory musculature GI: No organomegaly, abdomen is soft and non-tender Skin: No lesions in the area of chief complaint other than those listed below in MSK exam.  Neurologic: Sensation intact distally Psychiatric: Patient is competent for consent with normal mood and affect Lymphatic: No axillary or cervical lymphadenopathy  MUSCULOSKELETAL:  LLE: painless small arc ROM, distally NVI, compartments are soft.  Other extremities are atraumatic with painless ROM and NVI.  Assessment: Left superior and inferior pubic rami fractures  Plan: WBAT Mobilize with PT Dispo when able Recommend ASA for dvt px while mobilizing.  Follow up with me in the office in 2wks   Renette Butters, MD Cell [phone]   09/24/2015 5:27 PM

## 2015-09-24 NOTE — ED Provider Notes (Signed)
CSN: [id number]     Arrival date & time 09/24/15  1534 History   First MD Initiated Contact with Patient 09/24/15 1537     Chief Complaint  Patient presents with  . Fall  . Hip Pain    Patient is a 81 y.o. female presenting with fall. The history is provided by the patient.  Fall This is a new problem. The current episode started today. The problem occurs constantly. The problem has been unchanged. Associated symptoms include arthralgias. Pertinent negatives include no abdominal pain, chest pain, fever, headaches, nausea, neck pain, rash, sore throat or vomiting. Exacerbated by: Walking, palpation of the left hip. She has tried nothing for the symptoms. The treatment provided no relief.    Past Medical History  Diagnosis Date  . Peripheral artery disease ([facility])   . Hyperlipidemia        . Arthritis   . Aortic stenosis      a. s/p AVR  . BUNDLE BRANCH BLOCK, LEFT    . CAROTID ARTERY STENOSIS 01/05/2009  . CVA 03/14/2010  . PERIPHERAL VASCULAR DISEASE 07/30/2007  . Chronic combined systolic and diastolic CHF (congestive heart failure) ([facility])     a. [place] 09/2011 RA 8, RV 58/2/11; PA 54/22 (37) PCWP 20; F CO/CI 4.57/2.67 PVR 3.7; b. L main no sig dz, LAD luminal irreg; LCx lg Ramus with luminal irreg, small AV Lcx witout sig dz, RCA luminal irreg; severe mitral annular calcifications; AV calcified c. EF 45-50% (07/2013);  d. 11/2014 Echo: EF 20-25%, diff HK, nl AV/MV, sev dil LA, mod TR/PR, PASP .  . Diabetes mellitus   . HTN (hypertension)   . Iron deficiency anemia 09/21/2011  . History of shingles   . Mitral regurgitation     a. s/p MVR  . S/P aortic valve replacement with bioprosthetic valve 06/15/2013    21 mm [facility] Ease bovine pericardial tissue valve  . S/P mitral valve replacement with bioprosthetic valve 06/15/2013    25 mm [facility] Mitral bovine pericardial tissue valve  . Fracture, fibula, shaft 01/30/2015    right   Past Surgical History  Procedure Laterality  Date  . Carotid endarterectomy Left 2011  . Polypectomy  12/27/04  . Cataract extraction Bilateral 2009  . Tee without cardioversion N/A 05/19/2013    Procedure: TRANSESOPHAGEAL ECHOCARDIOGRAM (TEE);  Surgeon: Laurey Morale, MD;  [facility];  Service: Cardiovascular;  Laterality: N/A;  . Colonoscopy    . Cardiac catheterization  2012  . Aortic valve replacement N/A 06/15/2013    Procedure: AORTIC VALVE REPLACEMENT (AVR);  Surgeon: Purcell Nails, MD;  Location: [facility] OR;  Service: Open Heart Surgery;  Laterality: N/A;  . Intraoperative transesophageal echocardiogram N/A 06/15/2013    Procedure: INTRAOPERATIVE TRANSESOPHAGEAL ECHOCARDIOGRAM;  Surgeon: Purcell Nails, MD;  Location: [facility] OR;  Service: Open Heart Surgery;  Laterality: N/A;  . Mitral valve replacement N/A 06/15/2013    Procedure: MITRAL VALVE (MV) REPLACEMENT;  Surgeon: Purcell Nails, MD;  [facility] OR;  Service: Open Heart Surgery;  Laterality: N/A;  . Epicardial pacing lead placement N/A 06/15/2013    Procedure: EPICARDIAL PACING LEAD PLACEMENT;  Surgeon: Purcell Nails, MD;  Location: [facility] OR;  Service: Open Heart Surgery;  Laterality: N/A;  . Left and right heart catheterization with coronary angiogram N/A 09/19/2011    Procedure: LEFT AND RIGHT HEART CATHETERIZATION WITH CORONARY ANGIOGRAM;  Surgeon: Dolores Patty, MD;  Location: [facility] CATH LAB;  Service: Cardiovascular;  Laterality: N/A;  .  Orif ankle fracture Right 01/31/2015    Procedure: OPEN REDUCTION INTERNAL FIXATION (ORIF) ANKLE FRACTURE;  Surgeon: Eldred Manges, MD;  Location: [facility] OR;  Service: Orthopedics;  Laterality: Right;  . Ep implantable device N/A 03/21/2015    Procedure: BiV Pacemaker Insertion CRT-P;  Surgeon: Duke Salvia, MD;  Location: [facility] INVASIVE CV LAB;  Service: Cardiovascular;  Laterality: N/A;  . Pacemaker insertion     Family History  Problem Relation Age of Onset  . Colon cancer Mother   . Diabetes Neg Hx   . Coronary artery disease  Neg Hx    Social History  Substance Use Topics  . Smoking status: Never Smoker   . Smokeless tobacco: Never Used  . Alcohol Use: No   OB History    No data available     Review of Systems  Constitutional: Negative for fever.  HENT: Negative for rhinorrhea and sore throat.   Eyes: Negative for visual disturbance.  Respiratory: Negative for chest tightness and shortness of breath.   Cardiovascular: Negative for chest pain and palpitations.  Gastrointestinal: Negative for nausea, vomiting, abdominal pain and constipation.  Genitourinary: Negative for dysuria and hematuria.  Musculoskeletal: Positive for arthralgias. Negative for back pain and neck pain.  Skin: Negative for rash.  Neurological: Negative for dizziness and headaches.  Psychiatric/Behavioral: Negative for confusion.  All other systems reviewed and are negative.     Allergies  Other and Prednisone  Home Medications   Prior to Admission medications   Medication Sig Start Date End Date Taking? Authorizing Provider  acetaminophen (TYLENOL) 500 MG tablet Take 500 mg by mouth every 6 (six) hours as needed (pain).    Yes Historical Provider, MD  amiodarone (PACERONE) 200 MG tablet Take 1 tablet (200 mg total) by mouth daily. 06/28/15  Yes Dolores Patty, MD  apixaban (ELIQUIS) 2.5 MG TABS tablet Take 1 tablet (2.5 mg total) by mouth 2 (two) times daily. 06/21/15  Yes Laurey Morale, MD  ferrous sulfate 325 (65 FE) MG tablet Take 1 tablet (325 mg total) by mouth 2 (two) times daily with a meal. 02/28/14  Yes Etta Grandchild, MD  furosemide (LASIX) 80 MG tablet Take 1 tablet (80 mg total) by mouth daily. 04/18/15  Yes Laurey Morale, MD  hydrALAZINE (APRESOLINE) 10 MG tablet Take 10 mg by mouth 2 (two) times daily.   Yes Historical Provider, MD  Insulin Lispro, Human, (HUMALOG KWIKPEN) 200 UNIT/ML SOPN Inject 0-9 Units into the skin 4 (four) times daily as needed. Patient taking differently: Inject 0-9 Units into the skin 2  (two) times daily.  06/05/15  Yes Etta Grandchild, MD  isosorbide mononitrate (IMDUR) 30 MG 24 hr tablet Take 0.5 tablets (15 mg total) by mouth daily. 08/14/15  Yes Dolores Patty, MD  metolazone (ZAROXOLYN) 5 MG tablet Take 5 mg by mouth as needed (for weight 132 or greater).    Yes Historical Provider, MD  milrinone (PRIMACOR) 20 MG/100ML SOLN infusion Inject 15.475 mcg/min into the vein continuous. 04/05/15  Yes Graciella Freer, PA-C  potassium chloride SA (K-DUR,KLOR-CON) 20 MEQ tablet Take 2 tablets (40 mEq total) by mouth 2 (two) times daily. 07/10/15  Yes Mariam Dollar Tillery, PA-C  HYDROcodone-homatropine [facility]) 5-1.5 MG/5ML syrup Take 5 mLs by mouth every 8 (eight) hours as needed for cough. Patient not taking: Reported on 09/24/2015 08/16/15   Etta Grandchild, MD  metolazone (ZAROXOLYN) 2.5 MG tablet Take 1 tablet by mouth  daily as  needed for more  than 3 pound weight gain in 1 day or 5 pounds in a week Patient not taking: Reported on 09/24/2015 09/17/15   Bevelyn Buckles Bensimhon, MD   BP 127/72 mmHg  Pulse 80  Temp(Src) 98.6 F (37 C) (Oral)  Resp 18  SpO2 91% Physical Exam  Constitutional: She is oriented to person, place, and time. She appears well-developed and well-nourished. No distress.  HENT:  Head: Normocephalic and atraumatic.  Mouth/Throat: Oropharynx is clear and moist.  Eyes: EOM are normal. Pupils are equal, round, and reactive to light.  Neck: Neck supple. No JVD present.  Cardiovascular: Normal rate, regular rhythm, normal heart sounds and intact distal pulses.  Exam reveals no gallop.   No murmur heard. Pulmonary/Chest: Effort normal and breath sounds normal. She has no wheezes. She has no rales.  Abdominal: Soft. She exhibits no distension. There is no tenderness.  Musculoskeletal:       Left hip: She exhibits decreased range of motion and tenderness.  Pain with weight bearing on left lower shimmy.  Neurological: She is alert and oriented to person,  place, and time. No cranial nerve deficit. She exhibits normal muscle tone.  Skin: Skin is warm and dry. No rash noted.  Psychiatric: Her behavior is normal.    ED Course  Procedures  None   Labs Review Labs Reviewed  CBC WITH DIFFERENTIAL/PLATELET - Abnormal; Notable for the following:    WBC 11.9 (*)    RBC 3.73 (*)    Hemoglobin 11.4 (*)    HCT 35.4 (*)    Neutro Abs 10.6 (*)    Lymphs Abs 0.6 (*)    All other components within normal limits  BASIC METABOLIC PANEL - Abnormal; Notable for the following:    Potassium 3.4 (*)    Chloride 99 (*)    Glucose, Bld 257 (*)    BUN 56 (*)    Creatinine, Ser 1.83 (*)    GFR calc non Af Amer 24 (*)    GFR calc Af Amer 28 (*)    All other components within normal limits    Imaging Review Dg Pelvis 1-2 Views  09/24/2015  ADDENDUM REPORT: 09/24/2015 16:44 ADDENDUM: Upon further review, there are left superior and inferior pubic rami fractures noted. Mild displacement of the superior pubic ramus fracture near the pubic symphysis. Electronically Signed   By: Charlett Nose M.D.   On: 09/24/2015 16:44  09/24/2015  CLINICAL DATA:  Fall today.  Left hip pain EXAM: PELVIS - 1-2 VIEW COMPARISON:  None. FINDINGS: Advanced degenerative changes in the hips bilaterally. SI joints are symmetric and unremarkable. No acute bony abnormality. Specifically, no fracture, subluxation, or dislocation. Soft tissues are intact. IMPRESSION: No acute bony abnormality. Electronically Signed: By: Charlett Nose M.D. On: 09/24/2015 16:25   I have personally reviewed and evaluated these images and lab results as part of my medical decision-making.  MDM   Final diagnoses:  Fall  Left hip pain   Patient is an 81 year old female who sustained a mechanical fall in her driveway and landed on her left hip. She can point to left hip pain. She is tender to palpation over the left greater trochanter. She has pain with weightbearing on the left lower extremity and has  decreased range of motion of the left lower extremity. Imaging shows acute left superior and inferior pubic rami fractures. Spoke with Dr. Eulah Pont, Ortho. Non op. Will admit to hospitalist for pain control and mobility. Labs with mild  leukocytosis, favor to be posttraumatic. Patient on milrinone drip at home for heart failure. Able to manage this on a telemetry floor bed. No acute events in the ED. Patient transferred in stable condition.  Discussed with Dr. Corlis Leak.  Maris Berger, MD 09/24/15 2143  Courteney Randall An, MD 09/24/15 [id number]

## 2015-09-24 NOTE — H&P (Addendum)
[facility] History and Physical  Joanna [name] ULA:[id number] DOB: Feb 11, 1930 DOA: 09/24/2015  Referring physician: ED PCP: Sanda Linger, MD   Chief Complaint: Fall  HPI:  [name] is a 81 year old female with a past medical history significant for hypertension, hyperlipidemia, systolic congestive heart failure last EF 20-25% 4/16, s/p aortic and mitral valve replacements, and diabetes mellitus type 2; who presents after a fall at home with complaints of left hip pain. Patient states while getting out of a car this afternoon at her daughter's house with whom she moves her left leg gave out and she fell to the ground. Denies any loss of consciousness or trauma to her head. Her daughter immediately called EMS and they were able to get her into a wheelchair she was having difficulty with standing on the left leg. She complains of an achy pain in the left hip that's worse with any movement or trying to stand. Symptoms have been mildly improved with Tylenol. Patient notes that she is home milrinone and this needs to be continued for history of congestive heart failure. She has a follow-up appointment with her cardiologist tomorrow morning.  Upon admission into the hospital the patient was evaluated with x-rays of the left hip which showed left superior and inferior pubic ramus fractures. Orthopedics was consulted and will follow with the patient in the a.m.   Review of Systems  Constitutional: Negative for fever, chills and weight loss.  HENT: Negative for hearing loss and tinnitus.   Eyes: Negative for photophobia and pain.  Respiratory: Negative for hemoptysis, sputum production and shortness of breath.   Cardiovascular: Negative for chest pain, palpitations and leg swelling.  Gastrointestinal: Negative for vomiting, abdominal pain, diarrhea and constipation.  Genitourinary: Negative for dysuria and frequency.  Musculoskeletal: Positive for joint pain and falls.  Skin: Negative for  itching and rash.  Neurological: Negative for speech change and focal weakness.  Endo/Heme/Allergies: Negative for environmental allergies and polydipsia.  Psychiatric/Behavioral: Negative for suicidal ideas and substance abuse.        Past Medical History  Diagnosis Date  . Peripheral artery disease (HCC)   . Hyperlipidemia        . Arthritis   . Aortic stenosis      a. s/p AVR  . BUNDLE BRANCH BLOCK, LEFT    . CAROTID ARTERY STENOSIS 01/05/2009  . CVA 03/14/2010  . PERIPHERAL VASCULAR DISEASE 07/30/2007  . Chronic combined systolic and diastolic CHF (congestive heart failure) (HCC)     a. RHC 09/2011 RA 8, RV 58/2/11; PA 54/22 (37) PCWP 20; F CO/CI 4.57/2.67 PVR 3.7; b. L main no sig dz, LAD luminal irreg; LCx lg Ramus with luminal irreg, small AV Lcx witout sig dz, RCA luminal irreg; severe mitral annular calcifications; AV calcified c. EF 45-50% (07/2013);  d. 11/2014 Echo: EF 20-25%, diff HK, nl AV/MV, sev dil LA, mod TR/PR, PASP .  . Diabetes mellitus   . HTN (hypertension)   . Iron deficiency anemia 09/21/2011  . History of shingles   . Mitral regurgitation     a. s/p MVR  . S/P aortic valve replacement with bioprosthetic valve 06/15/2013    21 mm [facility] Ease bovine pericardial tissue valve  . S/P mitral valve replacement with bioprosthetic valve 06/15/2013    25 mm [facility] Mitral bovine pericardial tissue valve  . Fracture, fibula, shaft 01/30/2015    right     Past Surgical History  Procedure Laterality Date  . Carotid endarterectomy Left  2011  . Polypectomy  12/27/04  . Cataract extraction Bilateral 2009  . Tee without cardioversion N/A 05/19/2013    Procedure: TRANSESOPHAGEAL ECHOCARDIOGRAM (TEE);  Surgeon: Laurey Morale, MD;  Location: [facility] ENDOSCOPY;  Service: Cardiovascular;  Laterality: N/A;  . Colonoscopy    . Cardiac catheterization  2012  . Aortic valve replacement N/A 06/15/2013    Procedure: AORTIC VALVE REPLACEMENT (AVR);  Surgeon:  Purcell Nails, MD;  Location[facility] OR;  Service: Open Heart Surgery;  Laterality: N/A;  . Intraoperative transesophageal echocardiogram N/A 06/15/2013    Procedure: INTRAOPERATIVE TRANSESOPHAGEAL ECHOCARDIOGRAM;  Surgeon: Purcell Nails, MD;  Location: [facility] OR;  Service: Open Heart Surgery;  Laterality: N/A;  . Mitral valve replacement N/A 06/15/2013    Procedure: MITRAL VALVE (MV) REPLACEMENT;  Surgeon: Purcell Nails, MD;  Location: [facility] OR;  Service: Open Heart Surgery;  Laterality: N/A;  . Epicardial pacing lead placement N/A 06/15/2013    Procedure: EPICARDIAL PACING LEAD PLACEMENT;  Surgeon: Purcell Nails, MD;  Location: [facility] OR;  Service: Open Heart Surgery;  Laterality: N/A;  . Left and right heart catheterization with coronary angiogram N/A 09/19/2011    Procedure: LEFT AND RIGHT HEART CATHETERIZATION WITH CORONARY ANGIOGRAM;  Surgeon: Dolores Patty, MD;  Location: [facility] CATH LAB;  Service: Cardiovascular;  Laterality: N/A;  . Orif ankle fracture Right 01/31/2015    Procedure: OPEN REDUCTION INTERNAL FIXATION (ORIF) ANKLE FRACTURE;  Surgeon: Eldred Manges, MD;  Location: [facility] OR;  Service: Orthopedics;  Laterality: Right;  . Ep implantable device N/A 03/21/2015    Procedure: BiV Pacemaker Insertion CRT-P;  Surgeon: Duke Salvia, MD;  Location: [facility] INVASIVE CV LAB;  Service: Cardiovascular;  Laterality: N/A;  . Pacemaker insertion        Social History:  reports that she has never smoked. She has never used smokeless tobacco. She reports that she does not drink alcohol or use illicit drugs. Where does patient live--home  and with whom if at home? Daughter  Can patient participate in ADLs? Yes  Allergies  Allergen Reactions  . Other Hives    STEROIDS  . Prednisone Hives and Other (See Comments)    She is allergic to all steroids!    Family History  Problem Relation Age of Onset  . Colon cancer Mother   . Diabetes Neg Hx   . Coronary artery disease Neg Hx       Prior to Admission  medications   Medication Sig Start Date End Date Taking? Authorizing Provider  acetaminophen (TYLENOL) 500 MG tablet Take 500 mg by mouth every 6 (six) hours as needed (pain).     Historical Provider, MD  amiodarone (PACERONE) 200 MG tablet Take 1 tablet (200 mg total) by mouth daily. 06/28/15   Dolores Patty, MD  apixaban (ELIQUIS) 2.5 MG TABS tablet Take 1 tablet (2.5 mg total) by mouth 2 (two) times daily. 06/21/15   Laurey Morale, MD  calcium citrate-vitamin D (CITRACAL+D) 315-200 MG-UNIT per tablet Take 1 tablet by mouth daily.    Historical Provider, MD  docusate sodium (COLACE) 100 MG capsule Take 100 mg by mouth 2 (two) times daily.      Historical Provider, MD  ferrous sulfate 325 (65 FE) MG tablet Take 1 tablet (325 mg total) by mouth 2 (two) times daily with a meal. 02/28/14   Etta Grandchild, MD  furosemide (LASIX) 80 MG tablet Take 1 tablet (80 mg total) by mouth daily. 04/18/15   Dalton  Alford Highland, MD  hydrALAZINE (APRESOLINE) 10 MG tablet Take 10 mg by mouth 2 (two) times daily.    Historical Provider, MD  HYDROcodone-homatropine (HYCODAN) 5-1.5 MG/5ML syrup Take 5 mLs by mouth every 8 (eight) hours as needed for cough. 08/16/15   Etta Grandchild, MD  Insulin Lispro, Human, (HUMALOG KWIKPEN) 200 UNIT/ML SOPN Inject 0-9 Units into the skin 4 (four) times daily as needed. 06/05/15   Etta Grandchild, MD  Insulin Pen Needle (NOVOFINE) 32G X 6 MM MISC Use daily with insulin 12/29/14   Etta Grandchild, MD  Insulin Pen Needle (NOVOFINE) 32G X 6 MM MISC 1 Act by Does not apply route daily. USE QID 06/05/15   Etta Grandchild, MD  isosorbide mononitrate (IMDUR) 30 MG 24 hr tablet Take 0.5 tablets (15 mg total) by mouth daily. 08/14/15   Dolores Patty, MD  metolazone (ZAROXOLYN) 2.5 MG tablet Take 1 tablet by mouth  daily as needed for more  than 3 pound weight gain in 1 day or 5 pounds in a week 09/17/15   Dolores Patty, MD  metolazone (ZAROXOLYN) 5 MG tablet Take 5 mg by mouth as needed (for  weight 133 or greater).    Historical Provider, MD  milrinone (PRIMACOR) 20 MG/100ML SOLN infusion Inject 15.475 mcg/min into the vein continuous. 04/05/15   Graciella Freer, PA-C  potassium chloride SA (K-DUR,KLOR-CON) 20 MEQ tablet Take 2 tablets (40 mEq total) by mouth 2 (two) times daily. 07/10/15   Graciella Freer, PA-C     Physical Exam: Filed Vitals:   09/24/15 1800 09/24/15 1830 09/24/15 1845 09/24/15 1930  BP:  118/66 115/70 124/76  Pulse:  82 85 85  Temp:      TempSrc:      Resp: SpO2: 94% 94% 93% 92%     Constitutional: Vital signs reviewed. Patient is a elderly female who appears to be in no acute distress and cooperative with exam. Alert and oriented x3.  Head: Normocephalic and atraumatic  Ear: TM normal bilaterally  Mouth: no erythema or exudates, MMM  Eyes: PERRL, EOMI, conjunctivae normal, No scleral icterus.  Neck: Supple, Trachea midline normal ROM, No JVD, mass, thyromegaly, or carotid bruit present.  Cardiovascular: RRR, S1 normal, S2 normal, no MRG, pulses symmetric and intact bilaterally  Pulmonary/Chest: CTAB, no wheezes, rales, or rhonchi  Abdominal: Soft. Non-tender, non-distended, bowel sounds are normal, no masses, organomegaly, or guarding present.  GU: no CVA tenderness Musculoskeletal: Decreased range of motion of the left hip with tenderness to palpation noted. Ext: no edema and no cyanosis, pulses palpable bilaterally (DP and PT)  Hematology: no cervical, inginal, or axillary adenopathy.  Neurological: A&O x3, Strenght is normal and symmetric bilaterally, cranial nerve II-XII are grossly intact, no focal motor deficit, sensory intact to light touch bilaterally.  Skin: Warm, dry and intact. No rash, cyanosis, or clubbing.  Psychiatric: Normal mood and affect. speech and behavior is normal. Judgment and thought content normal. Cognition and memory are normal.      Data Review   Micro Results No results found for this or  any previous visit (from the past 240 hour(s)).  Radiology Reports Dg Pelvis 1-2 Views  09/24/2015  ADDENDUM REPORT: 09/24/2015 16:44 ADDENDUM: Upon further review, there are left superior and inferior pubic rami fractures noted. Mild displacement of the superior pubic ramus fracture near the pubic symphysis. Electronically Signed   By: Charlett Nose M.D.   On: 09/24/2015  16:44  09/24/2015  CLINICAL DATA:  Fall today.  Left hip pain EXAM: PELVIS - 1-2 VIEW COMPARISON:  None. FINDINGS: Advanced degenerative changes in the hips bilaterally. SI joints are symmetric and unremarkable. No acute bony abnormality. Specifically, no fracture, subluxation, or dislocation. Soft tissues are intact. IMPRESSION: No acute bony abnormality. Electronically Signed: By: Charlett Nose M.D. On: 09/24/2015 16:25     CBC  Recent Labs Lab 09/24/15 1926  WBC 11.9*  HGB 11.4*  HCT 35.4*  PLT 168  MCV 94.9  MCH 30.6  MCHC 32.2  RDW 15.2  LYMPHSABS 0.6*  MONOABS 0.6  EOSABS 0.1  BASOSABS 0.0    Chemistries   Recent Labs Lab 09/24/15 1926  NA 136  K 3.4*  CL 99*  CO2 27  GLUCOSE 257*  BUN 56*  CREATININE 1.83*  CALCIUM 9.5   ------------------------------------------------------------------------------------------------------------------ CrCl cannot be calculated (Unknown ideal weight.). ------------------------------------------------------------------------------------------------------------------ No results for input(s): HGBA1C in the last 72 hours. ------------------------------------------------------------------------------------------------------------------ No results for input(s): CHOL, HDL, LDLCALC, TRIG, CHOLHDL, LDLDIRECT in the last 72 hours. ------------------------------------------------------------------------------------------------------------------ No results for input(s): TSH, T4TOTAL, T3FREE, THYROIDAB in the last 72 hours.  Invalid input(s):  FREET3 ------------------------------------------------------------------------------------------------------------------ No results for input(s): VITAMINB12, FOLATE, FERRITIN, TIBC, IRON, RETICCTPCT in the last 72 hours.  Coagulation profile No results for input(s): INR, PROTIME in the last 168 hours.  No results for input(s): DDIMER in the last 72 hours.  Cardiac Enzymes No results for input(s): CKMB, TROPONINI, MYOGLOBIN in the last 168 hours.  Invalid input(s): CK ------------------------------------------------------------------------------------------------------------------ Invalid input(s): POCBNP   CBG: No results for input(s): GLUCAP in the last 168 hours.     EKG: Independently reviewed. Sinus rhythm with a bifascicular block Assessment/Plan Principal Problem:    Fracture of left pelvis: Acute. Patient with complaints of left sided leg pain after a fall. X-ray of the hip reveals a superior and inferior left ramus fracture. Patient with difficulty ambulating. Orthopedics consulted in ED. -Admit MedSurg to bed -f/u orthopedic recommendations -Pain control with Tylenol and hydrocodone as needed -Physical therapy to eval and treat in a.m. -Consulted care management/social work for possible need of placement.  Fall -As seen above    Diabetes mellitus type 2: Chronic. Last hemoglobin A1c was 7. 1 06/2015. Initial hemoglobin on admission was 257 with anion gap of 10. -CBGs every 4 hours  -Sliding-scale of insulin every 4 hours on sensitive scale changed to every before meals and a.m.  Leukocytosis. Initial blood cell count 11.9. No acute signs of infection -Checking chest x-ray, UA -Recheck CBC in a.m.  Essential hypertension: Stable -Continue hydralazine per home regimen    S/P mitral and aortic valve replacement with bioprosthetic valve -Continue Eliquis    CKD (chronic kidney disease) stage 4, GFR 15-29 ml/min (HCC): Stable. Baseline creatinine appears to be  around 2. Upon admission into the emergency department creatinine noted to be 1.83 which is improved. -Continue to monitor    Chronic systolic congestive heart failure (HCC) -Continue milrinone, amiodarone, Eliquis, Lasix, hydralazine, isosorbide mononitrate, metolazone, potassium chloride  Hypokalemia: Initial potassium 3.4 patient on admission.  -Replace as needed  Code Status:   full Family Communication: bedside Disposition Plan: admit   Total time spent 55 minutes.Greater than 50% of this time was spent in counseling, explanation of diagnosis, planning of further management, and coordination of care  Clydie Braun [facility] Pager [phone]  If 7PM-7AM, please contact night-coverage [email or web address] Password [facility] 09/24/2015, 8:37 PM

## 2015-09-24 NOTE — ED Notes (Signed)
Dr. Smith at bedside.

## 2015-09-24 NOTE — ED Notes (Signed)
Phlebotomy at bedside.

## 2015-09-24 NOTE — ED Notes (Signed)
Pt arrives via EMS after sustaining a fall in her driveway. Pt states that 2 months ago she broke her left ankle and has had a weak ankle since. States that her left leg gave out and she fell on her left hip. C/o pain to left hip. Denies LOC, neck or back pain. No shortening or rotation.

## 2015-09-25 ENCOUNTER — Inpatient Hospital Stay (HOSPITAL_COMMUNITY): Admission: RE | Admit: 2015-09-25 | Payer: Medicare Other | Source: Ambulatory Visit

## 2015-09-25 DIAGNOSIS — E0822 Diabetes mellitus due to underlying condition with diabetic chronic kidney disease: Secondary | ICD-10-CM | POA: Diagnosis not present

## 2015-09-25 DIAGNOSIS — S329XXA Fracture of unspecified parts of lumbosacral spine and pelvis, initial encounter for closed fracture: Secondary | ICD-10-CM | POA: Diagnosis not present

## 2015-09-25 DIAGNOSIS — S329XXD Fracture of unspecified parts of lumbosacral spine and pelvis, subsequent encounter for fracture with routine healing: Secondary | ICD-10-CM

## 2015-09-25 DIAGNOSIS — I5022 Chronic systolic (congestive) heart failure: Secondary | ICD-10-CM | POA: Diagnosis not present

## 2015-09-25 DIAGNOSIS — N184 Chronic kidney disease, stage 4 (severe): Secondary | ICD-10-CM | POA: Diagnosis not present

## 2015-09-25 DIAGNOSIS — S32592A Other specified fracture of left pubis, initial encounter for closed fracture: Secondary | ICD-10-CM | POA: Diagnosis not present

## 2015-09-25 LAB — BASIC METABOLIC PANEL
ANION GAP: 8 (ref 5–15)
BUN: 46 mg/dL — ABNORMAL HIGH (ref 6–20)
CHLORIDE: 101 mmol/L (ref 101–111)
CO2: 31 mmol/L (ref 22–32)
Calcium: 9.4 mg/dL (ref 8.9–10.3)
Creatinine, Ser: 1.62 mg/dL — ABNORMAL HIGH (ref 0.44–1.00)
GFR calc Af Amer: 33 mL/min — ABNORMAL LOW (ref >60)
GFR, EST NON AFRICAN AMERICAN: 28 mL/min — AB (ref >60)
GLUCOSE: 188 mg/dL — AB (ref 65–99)
POTASSIUM: 3.3 mmol/L — AB (ref 3.5–5.1)
Sodium: 140 mmol/L (ref 135–145)

## 2015-09-25 LAB — CBC WITH DIFFERENTIAL/PLATELET
BASOS ABS: 0 10*3/uL (ref 0.0–0.1)
Basophils Relative: 0 %
EOS PCT: 0 %
Eosinophils Absolute: 0 10*3/uL (ref 0.0–0.7)
HCT: 34.6 % — ABNORMAL LOW (ref 36.0–46.0)
HEMOGLOBIN: 11.4 g/dL — AB (ref 12.0–15.0)
LYMPHS ABS: 0.7 10*3/uL (ref 0.7–4.0)
LYMPHS PCT: 8 %
MCH: 31.2 pg (ref 26.0–34.0)
MCHC: 32.9 g/dL (ref 30.0–36.0)
MCV: 94.8 fL (ref 78.0–100.0)
Monocytes Absolute: 0.7 10*3/uL (ref 0.1–1.0)
Monocytes Relative: 8 %
NEUTROS ABS: 7.7 10*3/uL (ref 1.7–7.7)
NEUTROS PCT: 84 %
PLATELETS: 160 10*3/uL (ref 150–400)
RBC: 3.65 MIL/uL — AB (ref 3.87–5.11)
RDW: 15.2 % (ref 11.5–15.5)
WBC: 9.1 10*3/uL (ref 4.0–10.5)

## 2015-09-25 LAB — GLUCOSE, CAPILLARY
GLUCOSE-CAPILLARY: 189 mg/dL — AB (ref 65–99)
GLUCOSE-CAPILLARY: 201 mg/dL — AB (ref 65–99)
GLUCOSE-CAPILLARY: 266 mg/dL — AB (ref 65–99)
GLUCOSE-CAPILLARY: 248 mg/dL — AB (ref 65–99)
Glucose-Capillary: 336 mg/dL — ABNORMAL HIGH (ref 65–99)

## 2015-09-25 LAB — URINALYSIS, ROUTINE W REFLEX MICROSCOPIC
Bilirubin Urine: NEGATIVE
Glucose, UA: NEGATIVE mg/dL
HGB URINE DIPSTICK: NEGATIVE
Ketones, ur: NEGATIVE mg/dL
LEUKOCYTES UA: NEGATIVE
NITRITE: NEGATIVE
PROTEIN: NEGATIVE mg/dL
SPECIFIC GRAVITY, URINE: 1.013 (ref 1.005–1.030)
pH: 5.5 (ref 5.0–8.0)

## 2015-09-25 MED ORDER — CALCIUM CARBONATE-VITAMIN D 500-200 MG-UNIT PO TABS
1.0000 | ORAL_TABLET | Freq: Every day | ORAL | Status: DC
  Administered 2015-09-26 – 2015-09-27 (×2): 1 via ORAL
  Filled 2015-09-25 (×2): qty 1

## 2015-09-25 MED ORDER — FERROUS SULFATE 325 (65 FE) MG PO TABS
325.0000 mg | ORAL_TABLET | Freq: Two times a day (BID) | ORAL | Status: DC
  Administered 2015-09-26 – 2015-09-27 (×4): 325 mg via ORAL
  Filled 2015-09-25 (×4): qty 1

## 2015-09-25 MED ORDER — SODIUM CHLORIDE 0.9 % IJ SOLN
10.0000 mL | INTRAMUSCULAR | Status: DC | PRN
  Administered 2015-09-26: 10 mL
  Filled 2015-09-25: qty 40

## 2015-09-25 MED ORDER — SODIUM CHLORIDE 0.9 % IJ SOLN: 10.0000 mL | Freq: Two times a day (BID) | INTRAMUSCULAR | Status: DC

## 2015-09-25 NOTE — Progress Notes (Signed)
Pt switched from home milrinone pump to hospital IV pump via IV team. Pt requesting to leave home pump in room for daughter to take home in the morning.

## 2015-09-25 NOTE — Consult Note (Signed)
Advanced Heart Failure Team Consult Note  Referring Physician: Dr Lafe Garin  Primary Physician: Dr Yetta Barre  Primary HF Cardiologist:  Dr Gala Romney   Reason for Consultation: Heart Failure   HPI:   Joanna [name] is a 81 year old woman with aortic stenosis s/p AVR/mitral valve replacement (06/15/2013), chornic systolic HF due to NICM (on chronic milrinone), hypertension, diabetes mellitus type II, hyperlipidemia, and peripheral artery disease. Underwent AVR/MVR with placement of epicardial lead on 06/15/13 with PAF post-op terminated with amiodarone. Upgraded to [facility] Jude CRT-D 03/22/2015.   She has been followed closely in the HF clinic and was recently seen 08/30/2015. At that time she was stable on milrinone. She continued on lasix 80 mg daily with metolazone as needed.   Admitted after fall at home. Yesterday she was walking at home and her legs gave out. Fell on her left hip. Denies syncope. EMS called. X-Ray of left hip showed left superior and inferior pubic ramus fracture. From HF perspective she has been taking all medications. Most recent weight at home 130 pounds. Today she is complaining of hip pain. Denies SOB/orthopnea.  CXR: Stable cardiomegaly. Opacity LLL atelectasis versus pneumonia.   Pertinent admission labs include: Creatinine 1.8 K 3.4 Hgb 11.9 Sodium 136.     4/16 ECHO EF 20-25%, diffuse hypokinesis, mild LVH, bioprosthetic aortic valve functioning normally, bioprosthetic mitral valve with mild central MR and mean gradient 4 mmHg.  Review of Systems: [y] = yes, [ ]  = no   General: Weight gain [ ] ; Weight loss [ ] ; Anorexia [ ] ; Fatigue [Y ]; Fever [ ] ; Chills [ ] ; Weakness [Y ]  Cardiac: Chest pain/pressure [ ] ; Resting SOB [ ] ; Exertional SOB [ ] ; Orthopnea [ ] ; Pedal Edema [ ] ; Palpitations [ ] ; Syncope [ ] ; Presyncope [ ] ; Paroxysmal nocturnal dyspnea[ ]   Pulmonary: Cough [ ] ; Wheezing[ ] ; Hemoptysis[ ] ; Sputum [ ] ; Snoring [ ]   GI: Vomiting[ ] ; Dysphagia[ ] ; Melena[ ] ;  Hematochezia [ ] ; Heartburn[ ] ; Abdominal pain [ ] ; Constipation [ ] ; Diarrhea [ ] ; BRBPR [ ]   GU: Hematuria[ ] ; Dysuria [ ] ; Nocturia[ ]   Vascular: Pain in legs with walking [ ] ; Pain in feet with lying flat [ ] ; Non-healing sores [ ] ; Stroke [ ] ; TIA [ ] ; Slurred speech [ ] ;  Neuro: Headaches[ ] ; Vertigo[ ] ; Seizures[ ] ; Paresthesias[ ] ;Blurred vision [ ] ; Diplopia [ ] ; Vision changes [ ]   Ortho/Skin: Arthritis [ ] ; Joint pain [Y ]; Muscle pain [ ] ; Joint swelling [ ] ; Back Pain [ Y]; Rash [ ]   Psych: Depression[ ] ; Anxiety[ ]   Heme: Bleeding problems [ ] ; Clotting disorders [ ] ; Anemia [ ]   Endocrine: Diabetes [Y ]; Thyroid dysfunction[ ]   Home Medications Prior to Admission medications   Medication Sig Start Date End Date Taking? Authorizing Provider  acetaminophen (TYLENOL) 500 MG tablet Take 500 mg by mouth every 6 (six) hours as needed (pain).    Yes Historical Provider, MD  amiodarone (PACERONE) 200 MG tablet Take 1 tablet (200 mg total) by mouth daily. 06/28/15  Yes Dolores Patty, MD  apixaban (ELIQUIS) 2.5 MG TABS tablet Take 1 tablet (2.5 mg total) by mouth 2 (two) times daily. 06/21/15  Yes Laurey Morale, MD  ferrous sulfate 325 (65 FE) MG tablet Take 1 tablet (325 mg total) by mouth 2 (two) times daily with a meal. 02/28/14  Yes Etta Grandchild, MD  furosemide (LASIX) 80 MG tablet Take 1 tablet (80 mg total) by  mouth daily. 04/18/15  Yes Laurey Morale, MD  hydrALAZINE (APRESOLINE) 10 MG tablet Take 10 mg by mouth 2 (two) times daily.   Yes Historical Provider, MD  Insulin Lispro, Human, (HUMALOG KWIKPEN) 200 UNIT/ML SOPN Inject 0-9 Units into the skin 4 (four) times daily as needed. Patient taking differently: Inject 0-9 Units into the skin 2 (two) times daily.  06/05/15  Yes Etta Grandchild, MD  isosorbide mononitrate (IMDUR) 30 MG 24 hr tablet Take 0.5 tablets (15 mg total) by mouth daily. 08/14/15  Yes Dolores Patty, MD  metolazone (ZAROXOLYN) 5 MG tablet Take 5 mg by mouth  as needed (for weight 132 or greater).    Yes Historical Provider, MD  potassium chloride SA (K-DUR,KLOR-CON) 20 MEQ tablet Take 2 tablets (40 mEq total) by mouth 2 (two) times daily. 07/10/15  Yes Graciella Freer, PA-C  sodium chloride 0.9 % SOLN with milrinone 1 MG/ML SOLN 200 mcg/mL Inject 0.25 mcg/kg/min into the vein continuous.   Yes Historical Provider, MD  HYDROcodone-homatropine (HYCODAN) 5-1.5 MG/5ML syrup Take 5 mLs by mouth every 8 (eight) hours as needed for cough. Patient not taking: Reported on 09/24/2015 08/16/15   Etta Grandchild, MD  metolazone (ZAROXOLYN) 2.5 MG tablet Take 1 tablet by mouth  daily as needed for more  than 3 pound weight gain in 1 day or 5 pounds in a week Patient not taking: Reported on 09/24/2015 09/17/15   Dolores Patty, MD    Past Medical History: Past Medical History  Diagnosis Date  . Peripheral artery disease (HCC)   . Hyperlipidemia        . Arthritis   . Aortic stenosis      a. s/p AVR  . BUNDLE BRANCH BLOCK, LEFT    . CAROTID ARTERY STENOSIS 01/05/2009  . CVA 03/14/2010  . PERIPHERAL VASCULAR DISEASE 07/30/2007  . Chronic combined systolic and diastolic CHF (congestive heart failure) (HCC)     a. RHC 09/2011 RA 8, RV 58/2/11; PA 54/22 (37) PCWP 20; F CO/CI 4.57/2.67 PVR 3.7; b. L main no sig dz, LAD luminal irreg; LCx lg Ramus with luminal irreg, small AV Lcx witout sig dz, RCA luminal irreg; severe mitral annular calcifications; AV calcified c. EF 45-50% (07/2013);  d. 11/2014 Echo: EF 20-25%, diff HK, nl AV/MV, sev dil LA, mod TR/PR, PASP .  . Diabetes mellitus   . HTN (hypertension)   . Iron deficiency anemia 09/21/2011  . History of shingles   . Mitral regurgitation     a. s/p MVR  . S/P aortic valve replacement with bioprosthetic valve 06/15/2013    21 mm [facility] Ease bovine pericardial tissue valve  . S/P mitral valve replacement with bioprosthetic valve 06/15/2013    25 mm [facility] Mitral bovine pericardial  tissue valve  . Fracture, fibula, shaft 01/30/2015    right    Past Surgical History: Past Surgical History  Procedure Laterality Date  . Carotid endarterectomy Left 2011  . Polypectomy  12/27/04  . Cataract extraction Bilateral 2009  . Tee without cardioversion N/A 05/19/2013    Procedure: TRANSESOPHAGEAL ECHOCARDIOGRAM (TEE);  Surgeon: Laurey Morale, MD;  Location: [facility] ENDOSCOPY;  Service: Cardiovascular;  Laterality: N/A;  . Colonoscopy    . Cardiac catheterization  2012  . Aortic valve replacement N/A 06/15/2013    Procedure: AORTIC VALVE REPLACEMENT (AVR);  Surgeon: Purcell Nails, MD;  Location: [facility] OR;  Service: Open Heart Surgery;  Laterality: N/A;  . Intraoperative  transesophageal echocardiogram N/A 06/15/2013    Procedure: INTRAOPERATIVE TRANSESOPHAGEAL ECHOCARDIOGRAM;  Surgeon: Purcell Nails, MD;  Location: [facility] OR;  Service: Open Heart Surgery;  Laterality: N/A;  . Mitral valve replacement N/A 06/15/2013    Procedure: MITRAL VALVE (MV) REPLACEMENT;  Surgeon: Purcell Nails, MD;  Location: [facility] OR;  Service: Open Heart Surgery;  Laterality: N/A;  . Epicardial pacing lead placement N/A 06/15/2013    Procedure: EPICARDIAL PACING LEAD PLACEMENT;  Surgeon: Purcell Nails, MD;  Location: [facility] OR;  Service: Open Heart Surgery;  Laterality: N/A;  . Left and right heart catheterization with coronary angiogram N/A 09/19/2011    Procedure: LEFT AND RIGHT HEART CATHETERIZATION WITH CORONARY ANGIOGRAM;  Surgeon: Dolores Patty, MD;  Location: [facility] CATH LAB;  Service: Cardiovascular;  Laterality: N/A;  . Orif ankle fracture Right 01/31/2015    Procedure: OPEN REDUCTION INTERNAL FIXATION (ORIF) ANKLE FRACTURE;  Surgeon: Eldred Manges, MD;  Location: [facility] OR;  Service: Orthopedics;  Laterality: Right;  . Ep implantable device N/A 03/21/2015    Procedure: BiV Pacemaker Insertion CRT-P;  Surgeon: Duke Salvia, MD;  Location: [facility] INVASIVE CV LAB;  Service: Cardiovascular;  Laterality: N/A;  . Pacemaker  insertion      Family History: Family History  Problem Relation Age of Onset  . Colon cancer Mother   . Diabetes Neg Hx   . Coronary artery disease Neg Hx     Social History: Social History   Social History  . Marital Status: Divorced    Spouse Name: N/A  . Number of Children: N/A  . Years of Education: N/A   Social History Main Topics  . Smoking status: Never Smoker   . Smokeless tobacco: Never Used  . Alcohol Use: No  . Drug Use: No  . Sexual Activity: No   Other Topics Concern  . None   Social History Narrative   Patient lives alone here in town   She continues to work, helping to bind and oversew books   She is divorced after 23 years of marriage   1 son- '61, minister; 1 dtr '55; 4 grandchildren   End of life care-provided packet on living well    Allergies:  Allergies  Allergen Reactions  . Other Hives    STEROIDS  . Prednisone Hives and Other (See Comments)    She is allergic to all steroids!    Objective:    Vital Signs:   Temp:  [97.5 F (36.4 C)-98.9 F (37.2 C)] 98.9 F (37.2 C) (12/06 0403) Pulse Rate:  [79-90] 83 (12/06 0403) Resp:  [13-22] 18 (12/06 0403) BP: (105-127)/(56-82) 111/56 mmHg (12/06 0403) SpO2:  [89 %-100 %] 98 % (12/06 0403) Weight:  [130 lb 8.2 oz (59.2 kg)-131 lb 3.2 oz (59.512 kg)] 130 lb 8.2 oz (59.2 kg) (12/06 0403) Last BM Date: 09/24/15  Weight change: Filed Weights   09/24/15 2231 09/25/15 0403  Weight: 131 lb 3.2 oz (59.512 kg) 130 lb 8.2 oz (59.2 kg)    Intake/Output:   Intake/Output Summary (Last 24 hours) at 09/25/15 0802 Last data filed at 09/25/15 0346  Gross per 24 hour  Intake    240 ml  Output    300 ml  Net    -60 ml     Physical Exam: General:  Pale. Chronically ill appearing. No resp difficulty. In bed.  HEENT: normal Neck: supple. JVP ~8-9. Carotids 2+ bilat; no bruits. No lymphadenopathy or thryomegaly appreciated. Cor: PMI nondisplaced. Regular rate &  rhythm. No rubs, or murmurs.  +S3 Lungs: clear Abdomen: soft, nontender, nondistended. No hepatosplenomegaly. No bruits or masses. Good bowel sounds. Extremities: no cyanosis, clubbing, rash, no lower extremity edema. RUE PICC Neuro: alert & orientedx3, cranial nerves grossly intact. moves all 4 extremities w/o difficulty. Affect pleasant  Telemetry: V-paced 80s   Labs: Basic Metabolic Panel:  Recent Labs Lab 09/24/15 1926 09/25/15 0621  NA 136 140  K 3.4* 3.3*  CL 99* 101  CO2 27 31  GLUCOSE 257* 188*  BUN 56* 46*  CREATININE 1.83* 1.62*  CALCIUM 9.5 9.4    Liver Function Tests: No results for input(s): AST, ALT, ALKPHOS, BILITOT, PROT, ALBUMIN in the last 168 hours. No results for input(s): LIPASE, AMYLASE in the last 168 hours. No results for input(s): AMMONIA in the last 168 hours.  CBC:  Recent Labs Lab 09/24/15 1926 09/25/15 0621  WBC 11.9* 9.1  NEUTROABS 10.6* 7.7  HGB 11.4* 11.4*  HCT 35.4* 34.6*  MCV 94.9 94.8  PLT 168 160    Cardiac Enzymes: No results for input(s): CKTOTAL, CKMB, CKMBINDEX, TROPONINI in the last 168 hours.  BNP: BNP (last 3 results)  Recent Labs  03/13/15 1028 03/16/15 1619 03/31/15 0430  BNP 4332.8* >4500.0* 3300.6*    ProBNP (last 3 results) No results for input(s): PROBNP in the last 8760 hours.   CBG:  Recent Labs Lab 09/24/15 2301 09/25/15 0336  GLUCAP 225* 189*    Coagulation Studies: No results for input(s): LABPROT, INR in the last 72 hours.  Other results: EKG: V paced 85 bpm  Imaging: Dg Chest 1 View  09/25/2015  CLINICAL DATA:  81 year old female status post fall with left hip pain. No chest complaint. EXAM: CHEST 1 VIEW COMPARISON:  Chest radiograph dated 08/16/2015 FINDINGS: Right-sided PICC appears in stable positioning. Single-view of the chest demonstrate emphysematous changes of the lungs. There is an area of consolidation in the left lower lobe with silhouetting of the left hemidiaphragm similar to the prior study. This may  be related to an enlarged heart with associated partial compressive atelectasis of the left lower lobe. Pneumonia or underlying mass is not excluded. Clinical correlation is recommended. Stable cardiomegaly. Mechanical cardiac valve. The osseous structures are grossly unremarkable. IMPRESSION: Stable cardiomegaly with an area of retrocardiac opacity at the left lung base which may represent atelectasis versus pneumonia. Findings are grossly stable compared the prior study. Electronically Signed   By: Elgie Collard M.D.   On: 09/25/2015 01:31   Dg Pelvis 1-2 Views  09/24/2015  ADDENDUM REPORT: 09/24/2015 16:44 ADDENDUM: Upon further review, there are left superior and inferior pubic rami fractures noted. Mild displacement of the superior pubic ramus fracture near the pubic symphysis. Electronically Signed   By: Charlett Nose M.D.   On: 09/24/2015 16:44  09/24/2015  CLINICAL DATA:  Fall today.  Left hip pain EXAM: PELVIS - 1-2 VIEW COMPARISON:  None. FINDINGS: Advanced degenerative changes in the hips bilaterally. SI joints are symmetric and unremarkable. No acute bony abnormality. Specifically, no fracture, subluxation, or dislocation. Soft tissues are intact. IMPRESSION: No acute bony abnormality. Electronically Signed: By: Charlett Nose M.D. On: 09/24/2015 16:25      Medications:     Current Medications: . amiodarone  200 mg Oral Daily  . apixaban  2.5 mg Oral BID  . calcium-vitamin D  1 tablet Oral Q breakfast  . docusate sodium  100 mg Oral BID  . ferrous sulfate  325 mg Oral BID WC  .  furosemide  80 mg Oral Daily  . hydrALAZINE  10 mg Oral BID  . insulin aspart  0-9 Units Subcutaneous 6 times per day  . isosorbide mononitrate  15 mg Oral Daily  . potassium chloride SA  40 mEq Oral BID  . sodium chloride  10-40 mL Intracatheter Q12H     Infusions: . milrinone 0.25 mcg/kg/min (09/25/15 0035)      Assessment/ Plan   Ms Cuttino is an 81 year old with a history chronic systolic heart  failure on milrinone 0.25 mcg, ST Jude CRT-D , S/P biopresthetic AVR/MRV, CKD, PAF, and DM admitted after fall that resulted in pelvic fracture.   1. Fall- Pelvis Fracture Per Ortho--> No plan for surgery yet. If surgery a consideration she would be high risk but not prohibitive if if would allow her to ambulate.  2. Chronic Systolic Heart Failure- NYHA IIIb. NICM on chronic milrinone 0.25 mcg. From HF perspective she is stable. Plan to continue lasix 80 mg daily, hydralazine 10 mg twice a day +15 mg imdur daily.  3. CKD Stage IIII- Creatinine baseline 1.8-2.0 Todays creatinine 1.62  4. Aortic Stenosis/mitral regurgitation: S/P bioprothetic AVR/MVR 5. PAF- Maintaining SR. Continue amio 200 mg daily . Continue eliquis 2.5 mg twice a day  Thank you for the consult. We will follow along with you.   Length of Stay: 1  Joanna Clegg NP-C  09/25/2015, 8:02 AM  Advanced Heart Failure Team Pager [phone] (M-F; 7a - 4p)  Please contact [facility] for night-coverage after hours (4p -7a ) and weekends on [email or web address]  Patient seen with NP, agree with the above note.  No syncope, sounds like mechanical fall with pelvic fracture.  Will interrogate device to look for arrhythmia.    Stable from standpoint of heart failure.  Continue home milrinone dosing and Lasix.   Sounds like plan is for nonoperative management at this time.  If operation would be necessary for her to walk in the future, she would be high risk but not prohibitive.   [name] 09/25/2015 9:11 AM

## 2015-09-25 NOTE — Progress Notes (Signed)
[facility]  Patient Status: Active prior to this admission.  [facility] is providing the following services: [facility] and [facility] for home Milrinone.  [facility]  Hospital team will follow [name] while an inpatient and support DC when ordered based on home care needs.     If patient discharges after hours, please call [phone].   Joanna [name] 09/25/2015, 5:05 PM

## 2015-09-25 NOTE — Progress Notes (Signed)
CSW order received that patient may require short term SNF stay. Per discussion with unit RNCM- it has been determined that patient is observation status and would not have a qualifying 3 day hospital stay for SNF placement.  Patient lives at home with her daughter and receives services with [facility]. CSW will discuss with patient and daughter tomorrow to determine if they wish to seek short term placement with self pay arrangement; otherwise- would plan return home with home health services.  Joanna [name]. Jaci Lazier, [place] [phone]

## 2015-09-25 NOTE — Progress Notes (Signed)
Inpatient Diabetes Program Recommendations  AACE/ADA: New Consensus Statement on Inpatient Glycemic Control (2015)  Target Ranges:  Prepandial:   less than 140 mg/dL      Peak postprandial:   less than 180 mg/dL (1-2 hours)      Critically ill patients:  140 - 180 mg/dL  Results for Joanna [name], Joanna [name] (MRN [id number]) as of 09/25/2015 12:52  Ref. Range 09/24/2015 23:01 09/25/2015 03:36 09/25/2015 08:06 09/25/2015 12:10  Glucose-Capillary Latest Ref Range: 65-99 mg/dL 101 (H) 751 (H) 025 (H) 266 (H)   Review of Glycemic Control  Diabetes history: DM2 Outpatient Diabetes medications: Humalog 0-9 units BID Current orders for Inpatient glycemic control: Novolog 0-9 units Q4H  Inpatient Diabetes Program Recommendations: Insulin - Basal: Please consider ordering low dose basal insulin; recommend starting with Lantus 6 units QHS (based on 59 kg x 0.1 units). HgbA1C: Please consider ordering an A1C to evaluate glycemic control over the past 2-3 months.  Note: In reviewing the chart, noted patient use to be on Toujeo 20 units and it was discontinued in June 2016 when patient was discharged from hospital on 04/09/15.  Thanks, Orlando Penner, RN, MSN, CDE Diabetes Coordinator Inpatient Diabetes Program [phone] (Team Pager from 8am to 5pm) [phone] ([facility] office) [phone] [facility] office) [phone] [facility] office)

## 2015-09-25 NOTE — Evaluation (Signed)
Physical Therapy Evaluation Patient Details Name: Joanna [name] MRN: [id number] DOB: 09/02/1930 Today's Date: 09/25/2015   History of Present Illness  [name] is a 81 year old female with a past medical history significant for hypertension, hyperlipidemia, systolic congestive heart failure last EF 20-25% 4/16, s/p aortic and mitral valve replacements, and diabetes mellitus type 2; who presents after a fall at home with complaints of left hip pain. Xrays showed left superior and inferior pubic ramus fractures.  Clinical Impression  Pt s/p fall and now presents with L LE Pain due to fracture requiring modA for OOB mobility. Pt currently unsafe to return home unless 24/7 assist is available as pt is at high falls risk and is unable to transfer self to the bathroom. Pt to strongly benefit from ST-SNF to allow patient to progress to safe mod I level of function for safe transition home.    Follow Up Recommendations SNF;Supervision/Assistance - 24 hour    Equipment Recommendations  Rolling walker with 5" wheels (w/c for long distance, unable to amb, only able to stand pvt transfer)   Recommendations for Other Services       Precautions / Restrictions Precautions Precautions: Fall Restrictions Weight Bearing Restrictions: Yes LLE Weight Bearing: Weight bearing as tolerated      Mobility  Bed Mobility Overal bed mobility: Needs Assistance Bed Mobility: Supine to Sit     Supine to sit: Min assist     General bed mobility comments: max direcctional cues for task, assist to bring hips to EOB, pt unable to bring L knee to chest however able to do quad set and adduct LE off EOB with max v/c's  Transfers Overall transfer level: Needs assistance Equipment used: Rolling walker (2 wheeled) Transfers: Sit to/from Stand Sit to Stand: Min assist Stand pivot transfers: Mod assist       General transfer comment: v/c's to push up from bed, pt pushing towards R side due to L LE pain, minA  to steady patient. pt unable to tolerate L LE WBing and was unable to step towards chair. with modA pt had to pvt on R foot. Pt able to advance L LE but unable to tolerate WBing  Ambulation/Gait             General Gait Details: unable at this time  Stairs            Wheelchair Mobility    Modified Rankin (Stroke Patients Only)       Balance Overall balance assessment: Needs assistance Sitting-balance support: Feet supported;Bilateral upper extremity supported Sitting balance-Leahy Scale: Poor Sitting balance - Comments: leaning towards R due to L hip pain   Standing balance support: Bilateral upper extremity supported Standing balance-Leahy Scale: Poor Standing balance comment: dependent on UE support                             Pertinent Vitals/Pain Pain Assessment: Faces Faces Pain Scale: Hurts whole lot Pain Location: L LE with mvmt (is not in any discomfort at rest) Pain Intervention(s): Limited activity within patient's tolerance    Home Living Family/patient expects to be discharged to:: Skilled nursing facility Living Arrangements: Children               Additional Comments: pt lives with daughter     Prior Function Level of Independence: Needs assistance   Gait / Transfers Assistance Needed: pt was ambulating with cane  ADL's / Homemaking Assistance Needed: assist with  transfers in/out of tub and bathing/dressing        Hand Dominance   Dominant Hand: Right    Extremity/Trunk Assessment   Upper Extremity Assessment: Generalized weakness           Lower Extremity Assessment: LLE deficits/detail   LLE Deficits / Details: minimal voluntary mvmt due to pain  Cervical / Trunk Assessment: Normal  Communication   Communication: No difficulties  Cognition Arousal/Alertness: Awake/alert Behavior During Therapy: WFL for tasks assessed/performed Overall Cognitive Status: No family/caregiver present to determine baseline  cognitive functioning (unable to report date)                      General Comments      Exercises        Assessment/Plan    PT Assessment Patient needs continued PT services  PT Diagnosis Difficulty walking;Generalized weakness;Acute pain   PT Problem List Decreased strength;Decreased range of motion;Decreased activity tolerance;Decreased balance;Decreased mobility;Pain  PT Treatment Interventions DME instruction;Gait training;Stair training;Functional mobility training;Therapeutic activities;Therapeutic exercise;Balance training;Patient/family education   PT Goals (Current goals can be found in the Care Plan section) Acute Rehab PT Goals Patient Stated Goal: walk PT Goal Formulation: With patient Time For Goal Achievement: 10/09/15 Potential to Achieve Goals: Good    Frequency Min 4X/week   Barriers to discharge Decreased caregiver support pt daughter takes care of her special needs child and can not be there 24/7.    Co-evaluation               End of Session Equipment Utilized During Treatment: Gait belt Activity Tolerance: Patient limited by pain Patient left: in chair;with call bell/phone within reach;with chair alarm set Nurse Communication: Mobility status    Functional Assessment Tool Used: clinical judgement Functional Limitation: Mobility: Walking and moving around Mobility: Walking and Moving Around Current Status (Z6109): At least 40 percent but less than 60 percent impaired, limited or restricted Mobility: Walking and Moving Around Goal Status [phone]): At least 1 percent but less than 20 percent impaired, limited or restricted    Time: 1315-1339 PT Time Calculation (min) (ACUTE ONLY): 24 min   Charges:   PT Evaluation $Initial PT Evaluation Tier I: 1 Procedure PT Treatments $Therapeutic Activity: 8-22 mins   PT G Codes:   PT G-Codes **NOT FOR INPATIENT CLASS** Functional Assessment Tool Used: clinical judgement Functional Limitation:  Mobility: Walking and moving around Mobility: Walking and Moving Around Current Status (U9811): At least 40 percent but less than 60 percent impaired, limited or restricted Mobility: Walking and Moving Around Goal Status [id number]): At least 1 percent but less than 20 percent impaired, limited or restricted    Marcene Brawn 09/25/2015, 2:33 PM   Lewis Shock, PT, DPT Pager #: [phone] Office #: [phone]

## 2015-09-25 NOTE — Progress Notes (Signed)
Utilization review completed. Elnor Renovato, RN, BSN. 

## 2015-09-25 NOTE — Progress Notes (Signed)
PROGRESS NOTE  Joanna [name] FYT:[id number] DOB: 1929/11/24 DOA: 09/24/2015 PCP: Sanda Linger, MD   HPI: 81 year old female with a past medical history significant for hypertension, hyperlipidemia, systolic congestive heart failure last EF 20-25% 4/16, s/p aortic and mitral valve replacements, and diabetes mellitus type 2; who presents after a fall at home, found to have a pelvic fracture. Admitted on 12/5  Subjective / 24 H Interval events - hip pain this morning is controlled - denies chest pain / palpitations - no syncopal episode, appears to have been a mechanical fall  Assessment/Plan: Principal Problem:   Fracture of left pelvis (HCC) Active Problems:   Diabetes mellitus with renal complications (HCC)   Essential hypertension   S/P mitral and aortic valve replacement with bioprosthetic valve   CKD (chronic kidney disease) stage 4, GFR 15-29 ml/min (HCC)   Chronic systolic congestive heart failure (HCC)   Fall   Gait disturbance   Pubic ramus fracture (HCC)   Fracture of left pelvis - Acute, due to mechanical fall - orthopedic surgery consulted, appreciate input, likely to be treated with conservative management - PT evaluation pending - pain control  Fall - As seen above   Diabetes mellitus type 2 -  SSI  Chronic systolic congestive heart failure (HCC) - Continue milrinone and rest of her home medications - appears compensated - Heart failure notified, following  Leukocytosis - Initial blood cell count 11.9. No acute signs of infection, likely reactive - UA negative, CXR unchanged from prior ("area of retrocardiac opacity at the left lung base which may represent atelectasis versus pneumonia"). She has no respiratory symptoms - WBC normal this morning  Essential hypertension - Stable - Continue home regimen   S/P mitral and aortic valve replacement with bioprosthetic valve - Continue Eliquis   CKD (chronic kidney disease) stage 4 - Stable.  Baseline creatinine appears to be around 2.  - improved this morning   Hypokalemia - Replace as needed   Diet: Diet Heart Room service appropriate?: Yes; Fluid consistency:: Thin Fluids: none DVT Prophylaxis: Eliquis  Code Status: Full Code Family Communication: no family bedside  Disposition Plan: home vs SNF pending PT evaluation  Barriers to discharge: PT evaluation, symptom control  Consultants:  Cardiology (Heart Failure)  Procedures:  None    Antibiotics  Anti-infectives    None      Studies  Dg Chest 1 View  09/25/2015  CLINICAL DATA:  81 year old female status post fall with left hip pain. No chest complaint. EXAM: CHEST 1 VIEW COMPARISON:  Chest radiograph dated 08/16/2015 FINDINGS: Right-sided PICC appears in stable positioning. Single-view of the chest demonstrate emphysematous changes of the lungs. There is an area of consolidation in the left lower lobe with silhouetting of the left hemidiaphragm similar to the prior study. This may be related to an enlarged heart with associated partial compressive atelectasis of the left lower lobe. Pneumonia or underlying mass is not excluded. Clinical correlation is recommended. Stable cardiomegaly. Mechanical cardiac valve. The osseous structures are grossly unremarkable. IMPRESSION: Stable cardiomegaly with an area of retrocardiac opacity at the left lung base which may represent atelectasis versus pneumonia. Findings are grossly stable compared the prior study. Electronically Signed   By: Elgie Collard M.D.   On: 09/25/2015 01:31   Dg Pelvis 1-2 Views  09/24/2015  ADDENDUM REPORT: 09/24/2015 16:44 ADDENDUM: Upon further review, there are left superior and inferior pubic rami fractures noted. Mild displacement of the superior pubic ramus fracture near the pubic symphysis. Electronically  Signed   By: Charlett Nose M.D.   On: 09/24/2015 16:44  09/24/2015  CLINICAL DATA:  Fall today.  Left hip pain EXAM: PELVIS - 1-2 VIEW  COMPARISON:  None. FINDINGS: Advanced degenerative changes in the hips bilaterally. SI joints are symmetric and unremarkable. No acute bony abnormality. Specifically, no fracture, subluxation, or dislocation. Soft tissues are intact. IMPRESSION: No acute bony abnormality. Electronically Signed: By: Charlett Nose M.D. On: 09/24/2015 16:25   Objective  Filed Vitals:   09/25/15 0034 09/25/15 0341 09/25/15 0403 09/25/15 0802  BP: 112/63 109/57 111/56 111/65  Pulse: 90 85 83 87  Temp:   98.9 F (37.2 C) 98.3 F (36.8 C)  TempSrc:   Oral Oral  Resp:   18 18  Height:      Weight:   59.2 kg (130 lb 8.2 oz)   SpO2:   98% 91%    Intake/Output Summary (Last 24 hours) at 09/25/15 1106 Last data filed at 09/25/15 1028  Gross per 24 hour  Intake 1001.43 ml  Output    500 ml  Net 501.43 ml   Filed Weights   09/24/15 2231 09/25/15 0403  Weight: 59.512 kg (131 lb 3.2 oz) 59.2 kg (130 lb 8.2 oz)   Exam:  GENERAL: NAD  HEENT: no scleral icterus, PERRL  NECK: supple, no LAD  LUNGS: CTA biL, no wheezing  HEART: RRR without MRG  ABDOMEN: soft, non tender  EXTREMITIES: no clubbing / cyanosis  NEUROLOGIC: non focal  PSYCHIATRIC: normal mood and affect  SKIN: no rashes  Data Reviewed: Basic Metabolic Panel:  Recent Labs Lab 09/24/15 1926 09/25/15 0621  NA 136 140  K 3.4* 3.3*  CL 99* 101  CO2 27 31  GLUCOSE 257* 188*  BUN 56* 46*  CREATININE 1.83* 1.62*  CALCIUM 9.5 9.4   CBC:  Recent Labs Lab 09/24/15 1926 09/25/15 0621  WBC 11.9* 9.1  NEUTROABS 10.6* 7.7  HGB 11.4* 11.4*  HCT 35.4* 34.6*  MCV 94.9 94.8  PLT 168 160   BNP (last 3 results)  Recent Labs  03/13/15 1028 03/16/15 1619 03/31/15 0430  BNP 4332.8* >4500.0* 3300.6*   CBG:  Recent Labs Lab 09/24/15 2301 09/25/15 0336 09/25/15 0806  GLUCAP 225* 189* 201*   Scheduled Meds: . amiodarone  200 mg Oral Daily  . apixaban  2.5 mg Oral BID  . calcium-vitamin D  1 tablet Oral Q breakfast  .  docusate sodium  100 mg Oral BID  . ferrous sulfate  325 mg Oral BID WC  . furosemide  80 mg Oral Daily  . hydrALAZINE  10 mg Oral BID  . insulin aspart  0-9 Units Subcutaneous 6 times per day  . isosorbide mononitrate  15 mg Oral Daily  . potassium chloride SA  40 mEq Oral BID  . sodium chloride  10-40 mL Intracatheter Q12H   Continuous Infusions: . milrinone 0.25 mcg/kg/min (09/25/15 0035)    Pamella Pert, MD [facility] Pager [phone]. If 7 PM - 7 AM, please contact night-coverage at [email or web address], password [facility] 09/25/2015, 11:06 AM  LOS: 1 day

## 2015-09-26 DIAGNOSIS — S32592A Other specified fracture of left pubis, initial encounter for closed fracture: Secondary | ICD-10-CM | POA: Diagnosis not present

## 2015-09-26 DIAGNOSIS — I5022 Chronic systolic (congestive) heart failure: Secondary | ICD-10-CM | POA: Diagnosis not present

## 2015-09-26 DIAGNOSIS — E0822 Diabetes mellitus due to underlying condition with diabetic chronic kidney disease: Secondary | ICD-10-CM | POA: Diagnosis not present

## 2015-09-26 DIAGNOSIS — S329XXB Fracture of unspecified parts of lumbosacral spine and pelvis, initial encounter for open fracture: Secondary | ICD-10-CM

## 2015-09-26 DIAGNOSIS — S329XXD Fracture of unspecified parts of lumbosacral spine and pelvis, subsequent encounter for fracture with routine healing: Secondary | ICD-10-CM | POA: Diagnosis not present

## 2015-09-26 DIAGNOSIS — N184 Chronic kidney disease, stage 4 (severe): Secondary | ICD-10-CM | POA: Diagnosis not present

## 2015-09-26 LAB — CBC
HCT: 37.5 % (ref 36.0–46.0)
HEMOGLOBIN: 11.9 g/dL — AB (ref 12.0–15.0)
MCH: 30.3 pg (ref 26.0–34.0)
MCHC: 31.7 g/dL (ref 30.0–36.0)
MCV: 95.4 fL (ref 78.0–100.0)
Platelets: 158 10*3/uL (ref 150–400)
RBC: 3.93 MIL/uL (ref 3.87–5.11)
RDW: 15.4 % (ref 11.5–15.5)
WBC: 9.1 10*3/uL (ref 4.0–10.5)

## 2015-09-26 LAB — BASIC METABOLIC PANEL
Anion gap: 9 (ref 5–15)
BUN: 47 mg/dL — AB (ref 6–20)
CALCIUM: 9.6 mg/dL (ref 8.9–10.3)
CHLORIDE: 101 mmol/L (ref 101–111)
CO2: 29 mmol/L (ref 22–32)
CREATININE: 1.86 mg/dL — AB (ref 0.44–1.00)
GFR calc Af Amer: 28 mL/min — ABNORMAL LOW (ref >60)
GFR, EST NON AFRICAN AMERICAN: 24 mL/min — AB (ref >60)
GLUCOSE: 185 mg/dL — AB (ref 65–99)
POTASSIUM: 3.9 mmol/L (ref 3.5–5.1)
SODIUM: 139 mmol/L (ref 135–145)

## 2015-09-26 LAB — GLUCOSE, CAPILLARY
GLUCOSE-CAPILLARY: 154 mg/dL — AB (ref 65–99)
GLUCOSE-CAPILLARY: 196 mg/dL — AB (ref 65–99)
GLUCOSE-CAPILLARY: 281 mg/dL — AB (ref 65–99)
GLUCOSE-CAPILLARY: 359 mg/dL — AB (ref 65–99)
Glucose-Capillary: 157 mg/dL — ABNORMAL HIGH (ref 65–99)
Glucose-Capillary: 201 mg/dL — ABNORMAL HIGH (ref 65–99)
Glucose-Capillary: 203 mg/dL — ABNORMAL HIGH (ref 65–99)

## 2015-09-26 MED ORDER — POLYETHYLENE GLYCOL 3350 17 G PO PACK
17.0000 g | PACK | Freq: Every day | ORAL | Status: DC
  Administered 2015-09-26 – 2015-09-27 (×2): 17 g via ORAL
  Filled 2015-09-26 (×2): qty 1

## 2015-09-26 MED ORDER — INSULIN ASPART 100 UNIT/ML ~~LOC~~ SOLN
0.0000 [IU] | Freq: Three times a day (TID) | SUBCUTANEOUS | Status: DC
  Administered 2015-09-27: 3 [IU] via SUBCUTANEOUS
  Administered 2015-09-27: 5 [IU] via SUBCUTANEOUS
  Administered 2015-09-27: 3 [IU] via SUBCUTANEOUS

## 2015-09-26 MED ORDER — INSULIN ASPART 100 UNIT/ML ~~LOC~~ SOLN
0.0000 [IU] | Freq: Every day | SUBCUTANEOUS | Status: DC
  Administered 2015-09-26: 5 [IU] via SUBCUTANEOUS

## 2015-09-26 NOTE — Progress Notes (Signed)
Inpatient Diabetes Program Recommendations  AACE/ADA: New Consensus Statement on Inpatient Glycemic Control (2015)  Target Ranges:  Prepandial:   less than 140 mg/dL      Peak postprandial:   less than 180 mg/dL (1-2 hours)      Critically ill patients:  140 - 180 mg/dL  Results for Joanna [name], Joanna [name] (MRN [id number]) as of 09/26/2015 11:42  Ref. Range 09/24/2015 23:01 09/25/2015 03:36 09/25/2015 08:06 09/25/2015 12:10 09/25/2015 16:38 09/25/2015 20:16 09/26/2015 00:11 09/26/2015 03:51 09/26/2015 06:20 09/26/2015 08:03  Glucose-Capillary Latest Ref Range: 65-99 mg/dL 749 (H)  3 units 449 (H)  2 units 201 (H)  3 units 266 (H)  5 units 248 (H)  3 units 336 (H)  7 units 196 (H)  2 units 154 (H) 157 (H)  2 units 201 (H)  3 units   Review of Glycemic Control  Diabetes history: DM2 Outpatient Diabetes medications: Humalog 0-9 units BID Current orders for Inpatient glycemic control: Novolog 0-9 units Q4H  Inpatient Diabetes Program Recommendations: Insulin - Basal: Glucose ranged from 189-336 mg/dl on 67/5/91 and patient received a total of Novolog 23 units for correction on 09/25/15. Glucose has ranged from 154-201 mg/dl today so far and patient has already received a total of Novolog 7 units for correction today. Please consider ordering low dose basal insulin; recommend starting with Lantus 6 units QHS (based on 59 kg x 0.1 units). HgbA1C: Please consider ordering an A1C to evaluate glycemic control over the past 2-3 months.  Note: In reviewing the chart, noted patient use to be on Toujeo 20 units and it was discontinued in June 2016 when patient was discharged from hospital on 04/09/15.  Thanks, Orlando Penner, RN, MSN, CDE Diabetes Coordinator Inpatient Diabetes Program [phone] (Team Pager from 8am to 5pm) [phone] (AP office) [phone] [facility] office) [phone] [facility] office)

## 2015-09-26 NOTE — Progress Notes (Signed)
PROGRESS NOTE  Joanna [name] HDQ:[id number] DOB: 12/20/1929 DOA: 09/24/2015 PCP: Sanda Linger, MD   HPI: 81 year old female with a past medical history significant for hypertension, hyperlipidemia, systolic congestive heart failure last EF 20-25% 4/16, s/p aortic and mitral valve replacements, and diabetes mellitus type 2; who presents after a fall at home, found to have a pelvic fracture. Admitted on 12/5  Subjective / 24 H Interval events - pain medications helping with hip pain.   Assessment/Plan: Principal Problem:   Fracture of left pelvis (HCC) Active Problems:   Diabetes mellitus with renal complications (HCC)   Essential hypertension   S/P mitral and aortic valve replacement with bioprosthetic valve   CKD (chronic kidney disease) stage 4, GFR 15-29 ml/min (HCC)   Chronic systolic congestive heart failure (HCC)   Fall   Gait disturbance   Pubic ramus fracture (HCC)   Fracture, pelvis closed, initial encounter   Fracture of left pelvis - Acute, due to mechanical fall - orthopedic surgery consulted, appreciate input, likely to be treated with conservative management - PT evaluation recommends SNF.  - pain control -awaiting safe discharge planning. CM, SW working on placement, disposition.   Fall - As seen above   Diabetes mellitus type 2 -  SSI  Chronic systolic congestive heart failure (HCC) - Continue milrinone and rest of her home medications - appears compensated - Heart failure notified, following  Leukocytosis - Initial blood cell count 11.9. No acute signs of infection, likely reactive - UA negative, CXR unchanged from prior ("area of retrocardiac opacity at the left lung base which may represent atelectasis versus pneumonia"). She has no respiratory symptoms - WBC normal this morning  Essential hypertension - Stable - Continue home regimen   S/P mitral and aortic valve replacement with bioprosthetic valve - Continue Eliquis   CKD (chronic  kidney disease) stage 4 - Stable. Baseline creatinine appears to be around 2.  - improved this morning   Hypokalemia - Replace as needed   Diet: Diet Heart Room service appropriate?: Yes; Fluid consistency:: Thin Fluids: none DVT Prophylaxis: Eliquis  Code Status: Full Code Family Communication: no family bedside  Disposition Plan: home vs SNF pending PT evaluation  Barriers to discharge: PT evaluation, symptom control  Consultants:  Cardiology (Heart Failure)  Procedures:  None    Antibiotics  Anti-infectives    None      Studies  Dg Chest 1 View  09/25/2015  CLINICAL DATA:  81 year old female status post fall with left hip pain. No chest complaint. EXAM: CHEST 1 VIEW COMPARISON:  Chest radiograph dated 08/16/2015 FINDINGS: Right-sided PICC appears in stable positioning. Single-view of the chest demonstrate emphysematous changes of the lungs. There is an area of consolidation in the left lower lobe with silhouetting of the left hemidiaphragm similar to the prior study. This may be related to an enlarged heart with associated partial compressive atelectasis of the left lower lobe. Pneumonia or underlying mass is not excluded. Clinical correlation is recommended. Stable cardiomegaly. Mechanical cardiac valve. The osseous structures are grossly unremarkable. IMPRESSION: Stable cardiomegaly with an area of retrocardiac opacity at the left lung base which may represent atelectasis versus pneumonia. Findings are grossly stable compared the prior study. Electronically Signed   By: Elgie Collard M.D.   On: 09/25/2015 01:31   Dg Pelvis 1-2 Views  09/24/2015  ADDENDUM REPORT: 09/24/2015 16:44 ADDENDUM: Upon further review, there are left superior and inferior pubic rami fractures noted. Mild displacement of the superior pubic ramus fracture near  the pubic symphysis. Electronically Signed   By: Charlett Nose M.D.   On: 09/24/2015 16:44  09/24/2015  CLINICAL DATA:  Fall today.  Left  hip pain EXAM: PELVIS - 1-2 VIEW COMPARISON:  None. FINDINGS: Advanced degenerative changes in the hips bilaterally. SI joints are symmetric and unremarkable. No acute bony abnormality. Specifically, no fracture, subluxation, or dislocation. Soft tissues are intact. IMPRESSION: No acute bony abnormality. Electronically Signed: By: Charlett Nose M.D. On: 09/24/2015 16:25   Objective  Filed Vitals:   09/26/15 0045 09/26/15 0458 09/26/15 0946 09/26/15 1200  BP: 104/60 102/59 105/72 114/68  Pulse: 84 83 87   Temp: 98.8 F (37.1 C) 98.4 F (36.9 C) 97.7 F (36.5 C) 97.1 F (36.2 C)  TempSrc: Oral Oral  Oral  Resp: Height:      Weight:  58.87 kg (129 lb 12.6 oz)    SpO2: 97% 93% 93%     Intake/Output Summary (Last 24 hours) at 09/26/15 1444 Last data filed at 09/26/15 1400  Gross per 24 hour  Intake 1030.4 ml  Output    525 ml  Net  505.4 ml   Filed Weights   09/24/15 2231 09/25/15 0403 09/26/15 0458  Weight: 59.512 kg (131 lb 3.2 oz) 59.2 kg (130 lb 8.2 oz) 58.87 kg (129 lb 12.6 oz)   Exam:  GENERAL: NAD  HEENT: no scleral icterus, PERRL  NECK: supple, no LAD  LUNGS: CTA biL, no wheezing  HEART: RRR without MRG  ABDOMEN: soft, non tender  EXTREMITIES: no clubbing / cyanosis  NEUROLOGIC: non focal  PSYCHIATRIC: normal mood and affect  SKIN: no rashes  Data Reviewed: Basic Metabolic Panel:  Recent Labs Lab 09/24/15 1926 09/25/15 0621 09/26/15 0445  NA 136 140 139  K 3.4* 3.3* 3.9  CL 99* 101 101  CO2 GLUCOSE 257* 188* 185*  BUN 56* 46* 47*  CREATININE 1.83* 1.62* 1.86*  CALCIUM 9.5 9.4 9.6   CBC:  Recent Labs Lab 09/24/15 1926 09/25/15 0621 09/26/15 0445  WBC 11.9* 9.1 9.1  NEUTROABS 10.6* 7.7  --   HGB 11.4* 11.4* 11.9*  HCT 35.4* 34.6* 37.5  MCV 94.9 94.8 95.4  PLT 168 160 158   BNP (last 3 results)  Recent Labs  03/13/15 1028 03/16/15 1619 03/31/15 0430  BNP 4332.8* >4500.0* 3300.6*   CBG:  Recent  Labs Lab 09/26/15 0011 09/26/15 0351 09/26/15 0620 09/26/15 0803 09/26/15 1209  GLUCAP 196* 154* 157* 201* 203*   Scheduled Meds: . amiodarone  200 mg Oral Daily  . apixaban  2.5 mg Oral BID  . calcium-vitamin D  1 tablet Oral Q breakfast  . docusate sodium  100 mg Oral BID  . ferrous sulfate  325 mg Oral BID WC  . furosemide  80 mg Oral Daily  . hydrALAZINE  10 mg Oral BID  . insulin aspart  0-9 Units Subcutaneous 6 times per day  . isosorbide mononitrate  15 mg Oral Daily  . polyethylene glycol  17 g Oral Daily  . potassium chloride SA  40 mEq Oral BID  . sodium chloride  10-40 mL Intracatheter Q12H   Continuous Infusions: . milrinone 0.25 mcg/kg/min (09/25/15 1923)    Pamella Pert, MD [facility] Pager [phone]. If 7 PM - 7 AM, please contact night-coverage at [email or web address], password [facility] 09/26/2015, 2:44 PM  LOS: 2 days

## 2015-09-26 NOTE — Care Management Obs Status (Signed)
MEDICARE OBSERVATION STATUS NOTIFICATION   Patient Details  Name: Joanna [name] MRN: [id number] Date of Birth: Nov 05, 1929   Medicare Observation Status Notification Given:   code [address], [place] 09/26/2015, 4:45 PM

## 2015-09-26 NOTE — Consult Note (Signed)
   [facility] CM Inpatient Consult   09/26/2015  Joanna [name] 02-19-1930 [id number] Patient was assessed for [facility] for community services. Patient was previously active with [facility].  Met with patient at bedside regarding being restarted with [facility] services. Consent form previously.  Will follow for disposition and transition of care follow up.  Of note, [facility] Care Management services does not replace or interfere with any services that are arranged by inpatient case management or social work. For additional questions or referrals please contact: Natividad Brood, RN BSN [facility] Liaison  [phone] business mobile phone

## 2015-09-26 NOTE — Progress Notes (Signed)
DCP discussed with patient, daughter and son. Patient needs SNF placement but is Observational status. Per Medicare requirements patient must have a 3 day Inpatient hospital stay for SNF placement. Daughter and son are exploring privately paying for SNF. Hulan Saas Worker working on case. Abelino Derrick [facility] [phone]

## 2015-09-26 NOTE — Progress Notes (Signed)
Physical Therapy Treatment Patient Details Name: Joanna [name] MRN: [id number] DOB: [birth year] Today's Date: 09/26/2015    History of Present Illness [name] is a 81 year old female with a past medical history significant for hypertension, hyperlipidemia, systolic congestive heart failure last EF 20-25% 4/16, s/p aortic and mitral valve replacements, and diabetes mellitus type 2; who presents after a fall at home with complaints of left hip pain. Xrays showed left superior and inferior pubic ramus fractures.    PT Comments    Patient is progressing well today and she was able to take some small pivotal steps with transferring today. She stated that she was very fearful of falling again and this is somewhat limiting her mobility. Family stated that they plan to pay for SNF for ongoing therapy as they are unable to provide proper assistance at home and would like her to be somewhere she will get therapy daily.   Follow Up Recommendations  SNF;Supervision/Assistance - 24 hour     Equipment Recommendations  Rolling walker with 5" wheels;Wheelchair (measurements PT);Wheelchair cushion (measurements PT)    Recommendations for Other Services       Precautions / Restrictions Precautions Precautions: Fall Restrictions LLE Weight Bearing: Weight bearing as tolerated    Mobility  Bed Mobility Overal bed mobility: Needs Assistance Bed Mobility: Supine to Sit     Supine to sit: Min assist     General bed mobility comments: Min A to power trunk up into sitting and to position LEs at edge of bed. Cues for transitional movement and for use of rail  Transfers Overall transfer level: Needs assistance Equipment used: Rolling walker (2 wheeled)   Sit to Stand: Min assist Stand pivot transfers: Min assist;+2 safety/equipment       General transfer comment: Cues for safety hand placement and positioning of LEs in standing. Patient was able to take some small pivotal steps today with  transferring from bed to bsc to recliner. Patient limted by fear of falling. Able to take more weight through LLE when transferring back to recliner.   Ambulation/Gait             General Gait Details: unable at this time   Stairs            Wheelchair Mobility    Modified Rankin (Stroke Patients Only)       Balance                                    Cognition Arousal/Alertness: Awake/alert Behavior During Therapy: WFL for tasks assessed/performed Overall Cognitive Status: Within Functional Limits for tasks assessed                      Exercises General Exercises - Lower Extremity Ankle Circles/Pumps: 10 reps;AROM;Both Quad Sets: AROM;Both;10 reps    General Comments        Pertinent Vitals/Pain Faces Pain Scale: Hurts even more Pain Location: L LE when stepping Pain Intervention(s): Limited activity within patient's tolerance    Home Living                      Prior Function            PT Goals (current goals can now be found in the care plan section) Progress towards PT goals: Progressing toward goals    Frequency  Min 4X/week    PT Plan Current plan  remains appropriate    Co-evaluation             End of Session Equipment Utilized During Treatment: Gait belt Activity Tolerance: Patient tolerated treatment well Patient left: in chair;with call bell/phone within reach;with chair alarm set     Time: 6578-4696 PT Time Calculation (min) (ACUTE ONLY): 33 min  Charges:  $Therapeutic Exercise: 8-22 mins $Therapeutic Activity: 8-22 mins                    G Codes:      Fredrich Birks 09/26/2015, 1:30 PM 09/26/2015 Robinette, Adline Potter PTA

## 2015-09-27 ENCOUNTER — Encounter (HOSPITAL_COMMUNITY): Payer: Self-pay | Admitting: Emergency Medicine

## 2015-09-27 ENCOUNTER — Emergency Department (HOSPITAL_COMMUNITY)
Admission: EM | Admit: 2015-09-27 | Discharge: 2015-09-28 | Disposition: A | Discharge status: Home / Self Care | Payer: Medicare Other | Attending: Emergency Medicine | Admitting: Emergency Medicine

## 2015-09-27 DIAGNOSIS — Z79899 Other long term (current) drug therapy: Secondary | ICD-10-CM | POA: Diagnosis not present

## 2015-09-27 DIAGNOSIS — Z953 Presence of xenogenic heart valve: Secondary | ICD-10-CM | POA: Insufficient documentation

## 2015-09-27 DIAGNOSIS — Z8619 Personal history of other infectious and parasitic diseases: Secondary | ICD-10-CM | POA: Insufficient documentation

## 2015-09-27 DIAGNOSIS — X58XXXD Exposure to other specified factors, subsequent encounter: Secondary | ICD-10-CM | POA: Diagnosis not present

## 2015-09-27 DIAGNOSIS — E119 Type 2 diabetes mellitus without complications: Secondary | ICD-10-CM | POA: Diagnosis not present

## 2015-09-27 DIAGNOSIS — D509 Iron deficiency anemia, unspecified: Secondary | ICD-10-CM | POA: Diagnosis not present

## 2015-09-27 DIAGNOSIS — S329XXA Fracture of unspecified parts of lumbosacral spine and pelvis, initial encounter for closed fracture: Secondary | ICD-10-CM | POA: Diagnosis not present

## 2015-09-27 DIAGNOSIS — Z8673 Personal history of transient ischemic attack (TIA), and cerebral infarction without residual deficits: Secondary | ICD-10-CM | POA: Diagnosis not present

## 2015-09-27 DIAGNOSIS — Z954 Presence of other heart-valve replacement: Secondary | ICD-10-CM | POA: Insufficient documentation

## 2015-09-27 DIAGNOSIS — S3289XD Fracture of other parts of pelvis, subsequent encounter for fracture with routine healing: Secondary | ICD-10-CM | POA: Diagnosis present

## 2015-09-27 DIAGNOSIS — I5042 Chronic combined systolic (congestive) and diastolic (congestive) heart failure: Secondary | ICD-10-CM | POA: Diagnosis not present

## 2015-09-27 DIAGNOSIS — I129 Hypertensive chronic kidney disease with stage 1 through stage 4 chronic kidney disease, or unspecified chronic kidney disease: Secondary | ICD-10-CM | POA: Diagnosis not present

## 2015-09-27 DIAGNOSIS — S329XXD Fracture of unspecified parts of lumbosacral spine and pelvis, subsequent encounter for fracture with routine healing: Secondary | ICD-10-CM

## 2015-09-27 DIAGNOSIS — Z7902 Long term (current) use of antithrombotics/antiplatelets: Secondary | ICD-10-CM | POA: Insufficient documentation

## 2015-09-27 DIAGNOSIS — Z794 Long term (current) use of insulin: Secondary | ICD-10-CM | POA: Insufficient documentation

## 2015-09-27 DIAGNOSIS — S32592A Other specified fracture of left pubis, initial encounter for closed fracture: Secondary | ICD-10-CM | POA: Diagnosis not present

## 2015-09-27 DIAGNOSIS — Z950 Presence of cardiac pacemaker: Secondary | ICD-10-CM | POA: Insufficient documentation

## 2015-09-27 DIAGNOSIS — N189 Chronic kidney disease, unspecified: Secondary | ICD-10-CM

## 2015-09-27 DIAGNOSIS — M1990 Unspecified osteoarthritis, unspecified site: Secondary | ICD-10-CM | POA: Insufficient documentation

## 2015-09-27 LAB — BASIC METABOLIC PANEL
Anion gap: 10 (ref 5–15)
BUN: 58 mg/dL — AB (ref 6–20)
CHLORIDE: 99 mmol/L — AB (ref 101–111)
CO2: 26 mmol/L (ref 22–32)
CREATININE: 2.23 mg/dL — AB (ref 0.44–1.00)
Calcium: 9.8 mg/dL (ref 8.9–10.3)
GFR, EST AFRICAN AMERICAN: 22 mL/min — AB (ref >60)
GFR, EST NON AFRICAN AMERICAN: 19 mL/min — AB (ref >60)
Glucose, Bld: 294 mg/dL — ABNORMAL HIGH (ref 65–99)
Potassium: 5.4 mmol/L — ABNORMAL HIGH (ref 3.5–5.1)
SODIUM: 135 mmol/L (ref 135–145)

## 2015-09-27 LAB — GLUCOSE, CAPILLARY
GLUCOSE-CAPILLARY: 260 mg/dL — AB (ref 65–99)
GLUCOSE-CAPILLARY: 208 mg/dL — AB (ref 65–99)
GLUCOSE-CAPILLARY: 245 mg/dL — AB (ref 65–99)

## 2015-09-27 LAB — CBG MONITORING, ED: Glucose-Capillary: 286 mg/dL — ABNORMAL HIGH (ref 65–99)

## 2015-09-27 MED ORDER — MILRINONE IN DEXTROSE 20 MG/100ML IV SOLN: 0.2500 ug/kg/min | INTRAVENOUS | Status: DC

## 2015-09-27 MED ORDER — METOLAZONE 2.5 MG PO TABS: 2.5000 mg | ORAL_TABLET | Freq: Every day | ORAL | Status: DC | PRN

## 2015-09-27 MED ORDER — INSULIN ASPART 100 UNIT/ML ~~LOC~~ SOLN
0.0000 [IU] | Freq: Every day | SUBCUTANEOUS | Status: DC
  Administered 2015-09-27: 3 [IU] via SUBCUTANEOUS
  Filled 2015-09-27: qty 1

## 2015-09-27 MED ORDER — INSULIN DETEMIR 100 UNIT/ML ~~LOC~~ SOLN: 5.0000 [IU] | Freq: Every day | SUBCUTANEOUS | Status: DC

## 2015-09-27 MED ORDER — HYDROCODONE-ACETAMINOPHEN 5-325 MG PO TABS: 1.0000 | ORAL_TABLET | ORAL | Status: DC | PRN

## 2015-09-27 MED ORDER — CALCIUM CARBONATE-VITAMIN D 500-200 MG-UNIT PO TABS
1.0000 | ORAL_TABLET | Freq: Every day | ORAL | Status: DC
  Administered 2015-09-28: 1 via ORAL
  Filled 2015-09-27 (×2): qty 1

## 2015-09-27 MED ORDER — HYDROCODONE-ACETAMINOPHEN 5-325 MG PO TABS: 0.5000 | ORAL_TABLET | ORAL | Status: DC | PRN

## 2015-09-27 MED ORDER — POLYETHYLENE GLYCOL 3350 17 G PO PACK
17.0000 g | PACK | Freq: Every day | ORAL | Status: DC
  Administered 2015-09-28: 17 g via ORAL
  Filled 2015-09-27: qty 1

## 2015-09-27 MED ORDER — INSULIN DETEMIR 100 UNIT/ML ~~LOC~~ SOLN
5.0000 [IU] | Freq: Every day | SUBCUTANEOUS | Status: DC
  Administered 2015-09-27: 5 [IU] via SUBCUTANEOUS
  Filled 2015-09-27 (×2): qty 0.05

## 2015-09-27 MED ORDER — FUROSEMIDE 20 MG PO TABS
80.0000 mg | ORAL_TABLET | Freq: Every day | ORAL | Status: DC
  Administered 2015-09-27 – 2015-09-28 (×2): 80 mg via ORAL
  Filled 2015-09-27 (×2): qty 4

## 2015-09-27 MED ORDER — DOCUSATE SODIUM 100 MG PO CAPS: 100.0000 mg | ORAL_CAPSULE | Freq: Two times a day (BID) | ORAL | Status: DC

## 2015-09-27 MED ORDER — APIXABAN 2.5 MG PO TABS
2.5000 mg | ORAL_TABLET | Freq: Two times a day (BID) | ORAL | Status: DC
Start: 2015-09-27 — End: 2015-09-28
  Administered 2015-09-27 – 2015-09-28 (×2): 2.5 mg via ORAL
  Filled 2015-09-27 (×3): qty 1

## 2015-09-27 MED ORDER — CALCIUM CARBONATE-VITAMIN D 500-200 MG-UNIT PO TABS: 1.0000 | ORAL_TABLET | Freq: Every day | ORAL | Status: DC

## 2015-09-27 MED ORDER — POLYETHYLENE GLYCOL 3350 17 G PO PACK: 17.0000 g | PACK | Freq: Every day | ORAL | Status: DC

## 2015-09-27 MED ORDER — DOCUSATE SODIUM 100 MG PO CAPS
100.0000 mg | ORAL_CAPSULE | Freq: Two times a day (BID) | ORAL | Status: DC
  Administered 2015-09-27 – 2015-09-28 (×2): 100 mg via ORAL
  Filled 2015-09-27 (×2): qty 1

## 2015-09-27 MED ORDER — MILRINONE IN DEXTROSE 20 MG/100ML IV SOLN
0.2500 ug/kg/min | INTRAVENOUS | Status: DC
Start: 2015-09-27 — End: 2015-09-28
  Administered 2015-09-27: 0.25 ug/kg/min via INTRAVENOUS

## 2015-09-27 MED ORDER — METOLAZONE 5 MG PO TABS: 5.0000 mg | ORAL_TABLET | ORAL | Status: DC | PRN

## 2015-09-27 MED ORDER — ISOSORBIDE MONONITRATE 15 MG HALF TABLET
15.0000 mg | ORAL_TABLET | Freq: Every day | ORAL | Status: DC
  Administered 2015-09-28: 15 mg via ORAL
  Filled 2015-09-27: qty 1

## 2015-09-27 MED ORDER — INSULIN LISPRO 200 UNIT/ML ~~LOC~~ SOPN: 0.0000 [IU] | PEN_INJECTOR | Freq: Four times a day (QID) | SUBCUTANEOUS | Status: DC | PRN

## 2015-09-27 MED ORDER — FERROUS SULFATE 325 (65 FE) MG PO TABS
325.0000 mg | ORAL_TABLET | Freq: Two times a day (BID) | ORAL | Status: DC
  Administered 2015-09-28: 325 mg via ORAL
  Filled 2015-09-27 (×3): qty 1

## 2015-09-27 MED ORDER — AMIODARONE HCL 200 MG PO TABS
200.0000 mg | ORAL_TABLET | Freq: Every day | ORAL | Status: DC
Start: 2015-09-28 — End: 2015-09-28
  Administered 2015-09-28: 200 mg via ORAL
  Filled 2015-09-27: qty 1

## 2015-09-27 MED ORDER — HYDRALAZINE HCL 10 MG PO TABS
10.0000 mg | ORAL_TABLET | Freq: Two times a day (BID) | ORAL | Status: DC
  Administered 2015-09-27 – 2015-09-28 (×2): 10 mg via ORAL
  Filled 2015-09-27 (×3): qty 1

## 2015-09-27 MED ORDER — INSULIN ASPART 100 UNIT/ML ~~LOC~~ SOLN
0.0000 [IU] | Freq: Three times a day (TID) | SUBCUTANEOUS | Status: DC
  Administered 2015-09-28: 2 [IU] via SUBCUTANEOUS
  Administered 2015-09-28: 5 [IU] via SUBCUTANEOUS
  Filled 2015-09-27 (×2): qty 1

## 2015-09-27 MED ORDER — HYDROCODONE-ACETAMINOPHEN 5-325 MG PO TABS
0.5000 | ORAL_TABLET | ORAL | Status: DC | PRN
  Administered 2015-09-28: 1 via ORAL
  Filled 2015-09-27: qty 1

## 2015-09-27 MED ORDER — ACETAMINOPHEN 500 MG PO TABS
500.0000 mg | ORAL_TABLET | Freq: Four times a day (QID) | ORAL | Status: DC | PRN
  Administered 2015-09-27: 500 mg via ORAL
  Filled 2015-09-27: qty 1

## 2015-09-27 NOTE — Care Management Note (Signed)
Case Management Note  Patient Details  Name: Joanna [name] MRN: [id number] Date of Birth: [birth year]  Subjective/Objective:       Patient admitted with pelvic fx- from home, on home milrinone and Active with [facility].  Family wants patient to go to [facility] - willing to pay privately as patient is OBS status, however [facility] will not have a bed until tomorrow.  Have another SNF offer but family only wants [facility], therefore plan is for her to go home with family tonight, will transport home per ambulance - SW setting up.  Family will be with her tonight and tomorrow and then will have ambulance transport her to [facility] tomorrow, SW arranging this with [facility].  Will need [facility] - (3-n-1 ordered) and verified [facility] has and will deliver.  Also notified Pam at [facility] of discharge home tonight and plan for admission to [facility] tomorrow.  Pam verified has order for Milrinone drip and will get that set up prior to discharge.             Action/Plan:   Expected Discharge Date:                  Expected Discharge Plan:  Home/Self Care  In-House Referral:  Clinical Social Work  Discharge planning Services  CM Consult  Post Acute Care Choice:    Choice offered to:     DME Arranged:    DME Agency:     HH Arranged:    HH Agency:     Status of Service:  Completed, signed off  Medicare Important Message Given:    Date Medicare IM Given:    Medicare IM give by:    Date Additional Medicare IM Given:    Additional Medicare Important Message give by:     If discussed at Long Length of Stay Meetings, dates discussed:    Additional Comments:  Vangie Bicker, RN 09/27/2015, 1:56 PM

## 2015-09-27 NOTE — Discharge Summary (Signed)
Physician Discharge Summary  Joanna [name] ZOX:[id number] DOB: [birth year] DOA: 09/24/2015  PCP: Joanna Linger, MD  Admit date: 09/24/2015 Discharge date: 09/27/2015  Time spent: greater than 30 minutes  Recommendations for Outpatient Follow-up:  1. To SNF 2. Monitor BMET 3. F/u Dr. Margarita [name] 2 weeks 4. Monitor blood glucose qac and hs   Discharge Diagnoses:  Principal Problem:   Fracture of left pelvis (HCC) Active Problems:   Diabetes mellitus with renal complications (HCC), uncontrolled   Essential hypertension   S/P mitral and aortic valve replacement with bioprosthetic valve   CKD (chronic kidney disease) stage 4, GFR 15-29 ml/min (HCC)   Chronic systolic congestive heart failure (HCC)   Fall   Gait disturbance   Pubic ramus fracture (HCC)   Fracture, pelvis closed, initial encounter Hypokalemia hyperkalemia  Discharge Condition: stable  Diet recommendation: diabetic heart healthy  Filed Weights   09/25/15 0403 09/26/15 0458 09/27/15 0558  Weight: 59.2 kg (130 lb 8.2 oz) 58.87 kg (129 lb 12.6 oz) 60.827 kg (134 lb 1.6 oz)    History of present illness:  [name] is a 81 year old female with a past medical history significant for hypertension, hyperlipidemia, systolic congestive heart failure last EF 20-25% 4/16, s/p aortic and mitral valve replacements, and diabetes mellitus type 2; who presents after a fall at home with complaints of left hip pain. Patient states while getting out of a car this afternoon at her daughter's house with whom she moves her left leg gave out and she fell to the ground. Denies any loss of consciousness or trauma to her head. Her daughter immediately called EMS and they were able to get her into a wheelchair she was having difficulty with standing on the left leg. She complains of an achy pain in the left hip that's worse with any movement or trying to stand. Symptoms have been mildly improved with Tylenol. Patient notes that she is home  milrinone and this needs to be continued for history of congestive heart failure. She has a follow-up appointment with her cardiologist tomorrow morning.  Upon admission into the hospital the patient was evaluated with x-rays of the left hip which showed left superior and inferior pubic ramus fractures. Orthopedics was consulted  Hospital Course:  Fracture of left pelvis - Acute, due to mechanical fall - orthopedic surgery consulted, nonoperative management. Rule out with Dr. Margarita [name] in 2 weeks.  - PT evaluation recommends SNF. Social work is arranging. - pain control Was on Eliquis was prior to admission. Will continue.  Diabetes mellitus type 2, uncontrolled with renal complications May resume home sliding scale insulin with meals. Added Levemir 5 units nightly.  Chronic systolic congestive heart failure (HCC) - Continue milrinone and rest of her home medications, compensated. Was seen by heart failure team during hospitalization  Leukocytosis Infectious workup negative. Likely to margination  Essential hypertension - Stable - Continue home regimen   S/P mitral and aortic valve replacement with bioprosthetic valve  CKD (chronic kidney disease) stage 4 - Stable. Baseline creatinine appears to be around 2.     Hypokalemia Initially, hypokalemic. This was repleted and potassium 5.4 today. We'll hold discharge and will need monitoring after discharge.   Code Status: Full Code  Consultants:  Cardiology (Heart Failure)  Orthopedics  Procedures:  None   Discharge Exam: Filed Vitals:   09/26/15 2013 09/27/15 0558  BP: 103/61 119/63  Pulse: 87 83  Temp: 98.1 F (36.7 C) 98 F (36.7 C)  Resp: 18 18  General: Comfortable. In chair. Cardiovascular: Regular rate rhythm without murmurs gallops rubs Respiratory: Clear to auscultation bilaterally without wheezes rhonchi or rales Extremities: No clubbing cyanosis or edema  Discharge  Instructions   Discharge Instructions    Diet - low sodium heart healthy    Complete by:  As directed      Diet Carb Modified    Complete by:  As directed      Walk with assistance    Complete by:  As directed      Walker     Complete by:  As directed             Medication List    STOP taking these medications        HYDROcodone-homatropine 5-1.5 MG/5ML syrup  Commonly known as:  HYCODAN     potassium chloride SA 20 MEQ tablet  Commonly known as:  K-DUR,KLOR-CON      TAKE these medications        acetaminophen 500 MG tablet  Commonly known as:  TYLENOL  Take 500 mg by mouth every 6 (six) hours as needed (pain).     amiodarone 200 MG tablet  Commonly known as:  PACERONE  Take 1 tablet (200 mg total) by mouth daily.     apixaban 2.5 MG Tabs tablet  Commonly known as:  ELIQUIS  Take 1 tablet (2.5 mg total) by mouth 2 (two) times daily.     calcium-vitamin D 500-200 MG-UNIT tablet  Commonly known as:  OSCAL WITH D  Take 1 tablet by mouth daily with breakfast.     docusate sodium 100 MG capsule  Commonly known as:  COLACE  Take 1 capsule (100 mg total) by mouth 2 (two) times daily.     ferrous sulfate 325 (65 FE) MG tablet  Take 1 tablet (325 mg total) by mouth 2 (two) times daily with a meal.     furosemide 80 MG tablet  Commonly known as:  LASIX  Take 1 tablet (80 mg total) by mouth daily.     hydrALAZINE 10 MG tablet  Commonly known as:  APRESOLINE  Take 10 mg by mouth 2 (two) times daily.     HYDROcodone-acetaminophen 5-325 MG tablet  Commonly known as:  NORCO/VICODIN  Take 0.5-1 tablets by mouth every 4 (four) hours as needed for moderate pain.     insulin detemir 100 UNIT/ML injection  Commonly known as:  LEVEMIR  Inject 0.05 mLs (5 Units total) into the skin at bedtime.     Insulin Lispro 200 UNIT/ML Sopn  Commonly known as:  HUMALOG KWIKPEN  Inject 0-9 Units into the skin 4 (four) times daily as needed.     isosorbide mononitrate 30 MG 24 hr  tablet  Commonly known as:  IMDUR  Take 0.5 tablets (15 mg total) by mouth daily.     metolazone 5 MG tablet  Commonly known as:  ZAROXOLYN  Take 5 mg by mouth as needed (for weight 132 or greater).     metolazone 2.5 MG tablet  Commonly known as:  ZAROXOLYN  Take 1 tablet by mouth  daily as needed for more  than 3 pound weight gain in 1 day or 5 pounds in a week     milrinone 20 MG/100ML Soln infusion  Commonly known as:  PRIMACOR  Inject 14.575 mcg/min into the vein continuous.     polyethylene glycol packet  Commonly known as:  MIRALAX / GLYCOLAX  Take 17 g by mouth daily.  sodium chloride 0.9 % SOLN with milrinone 1 MG/ML SOLN 200 mcg/mL  Inject 0.25 mcg/kg/min into the vein continuous.       Allergies  Allergen Reactions  . Other Hives    STEROIDS  . Prednisone Hives and Other (See Comments)    She is allergic to all steroids!   Follow-up Information    Follow up with MURPHY, TIMOTHY D, MD In 2 weeks.   Specialty:  Orthopedic Surgery   Contact information:   [address] [place] [address] [phone]        The results of significant diagnostics from this hospitalization (including imaging, microbiology, ancillary and laboratory) are listed below for reference.    Significant Diagnostic Studies: Dg Chest 1 View  09/25/2015  CLINICAL DATA:  81 year old female status post fall with left hip pain. No chest complaint. EXAM: CHEST 1 VIEW COMPARISON:  Chest radiograph dated 08/16/2015 FINDINGS: Right-sided PICC appears in stable positioning. Single-view of the chest demonstrate emphysematous changes of the lungs. There is an area of consolidation in the left lower lobe with silhouetting of the left hemidiaphragm similar to the prior study. This may be related to an enlarged heart with associated partial compressive atelectasis of the left lower lobe. Pneumonia or underlying mass is not excluded. Clinical correlation is recommended. Stable  cardiomegaly. Mechanical cardiac valve. The osseous structures are grossly unremarkable. IMPRESSION: Stable cardiomegaly with an area of retrocardiac opacity at the left lung base which may represent atelectasis versus pneumonia. Findings are grossly stable compared the prior study. Electronically Signed   By: Elgie Collard M.D.   On: 09/25/2015 01:31   Dg Pelvis 1-2 Views  09/24/2015  ADDENDUM REPORT: 09/24/2015 16:44 ADDENDUM: Upon further review, there are left superior and inferior pubic rami fractures noted. Mild displacement of the superior pubic ramus fracture near the pubic symphysis. Electronically Signed   By: Charlett Nose M.D.   On: 09/24/2015 16:44  09/24/2015  CLINICAL DATA:  Fall today.  Left hip pain EXAM: PELVIS - 1-2 VIEW COMPARISON:  None. FINDINGS: Advanced degenerative changes in the hips bilaterally. SI joints are symmetric and unremarkable. No acute bony abnormality. Specifically, no fracture, subluxation, or dislocation. Soft tissues are intact. IMPRESSION: No acute bony abnormality. Electronically Signed: By: Charlett Nose M.D. On: 09/24/2015 16:25    Microbiology: No results found for this or any previous visit (from the past 240 hour(s)).   Labs: Basic Metabolic Panel:  Recent Labs Lab 09/24/15 1926 09/25/15 0621 09/26/15 0445 09/27/15 0445  NA 136 140 139 135  K 3.4* 3.3* 3.9 5.4*  CL 99* 101 101 99*  CO2 27 31 29 26   GLUCOSE 257* 188* 185* 294*  BUN 56* 46* 47* 58*  CREATININE 1.83* 1.62* 1.86* 2.23*  CALCIUM 9.5 9.4 9.6 9.8   Liver Function Tests: No results for input(s): AST, ALT, ALKPHOS, BILITOT, PROT, ALBUMIN in the last 168 hours. No results for input(s): LIPASE, AMYLASE in the last 168 hours. No results for input(s): AMMONIA in the last 168 hours. CBC:  Recent Labs Lab 09/24/15 1926 09/25/15 0621 09/26/15 0445  WBC 11.9* 9.1 9.1  NEUTROABS 10.6* 7.7  --   HGB 11.4* 11.4* 11.9*  HCT 35.4* 34.6* 37.5  MCV 94.9 94.8 95.4  PLT 168 160 158    Cardiac Enzymes: No results for input(s): CKTOTAL, CKMB, CKMBINDEX, TROPONINI in the last 168 hours. BNP: BNP (last 3 results)  Recent Labs  03/13/15 1028 03/16/15 1619 03/31/15 0430  BNP 4332.8* >  4500.0* 3300.6*    ProBNP (last 3 results) No results for input(s): PROBNP in the last 8760 hours.  CBG:  Recent Labs Lab 09/26/15 1209 09/26/15 1648 09/26/15 2111 09/27/15 0601 09/27/15 1128  GLUCAP 203* 281* 359* 260* 208*       Signed:  SULLIVAN,CORINNA L  [facility] 09/27/2015, 11:36 AM

## 2015-09-27 NOTE — NC FL2 (Signed)
[place] MEDICAID FL2 LEVEL OF CARE SCREENING TOOL     IDENTIFICATION  Patient Name: Joanna [name] Birthdate: 1930/09/19 Sex: female Admission Date (Current Location): 09/24/2015  [facility] and [place] Number: Producer, television/film/video and Address:  [facility] [place], [address] [place], [place] [address]      Provider Number: [id number]  Attending Physician Name and Address:  Christiane Ha, MD  Relative Name and Phone Number:       Current Level of Care: Hospital Recommended Level of Care: Skilled Nursing Facility Prior Approval Number:    Date Approved/Denied:   PASRR Number:  [id number]  Discharge Plan: SNF    Current Diagnoses: Patient Active Problem List   Diagnosis Date Noted  . Fracture, pelvis closed, initial encounter 09/25/2015  . Fall 09/24/2015  . Fracture of left pelvis (HCC) 09/24/2015  . Gait disturbance 09/24/2015  . Pubic ramus fracture (HCC) 09/24/2015  . Cough 08/16/2015  . Pneumonia due to Streptococcus pneumoniae (HCC) 08/16/2015  . Pleural effusion, left 03/31/2015  . CKD (chronic kidney disease) stage 4, GFR 15-29 ml/min (HCC) 03/31/2015  . Chronic systolic congestive heart failure (HCC)   . Palliative care encounter   . NICM (nonischemic cardiomyopathy) (HCC)   . Malnutrition of moderate degree (HCC) 02/01/2015  . PAF (paroxysmal atrial fibrillation) (HCC) 12/15/2014  . S/P mitral and aortic valve replacement with bioprosthetic valve 06/15/2013  . Pulmonary HTN (HCC) 03/23/2009  . Diabetes mellitus with renal complications (HCC) 07/30/2007  . Hyperlipidemia with target LDL less than 100 07/30/2007  . Essential hypertension 07/30/2007    Orientation ACTIVITIES/SOCIAL BLADDER RESPIRATION    Self  Active Continent Normal  BEHAVIORAL SYMPTOMS/MOOD NEUROLOGICAL BOWEL NUTRITION STATUS      Continent Diet (Heart Healthy)  PHYSICIAN VISITS COMMUNICATION OF NEEDS Height & Weight Skin  30 days Verbally 5\' 4"  (162.6  cm) 134 lbs. Normal          AMBULATORY STATUS RESPIRATION    Assist extensive Normal      Personal Care Assistance Level of Assistance  Bathing, Dressing Bathing Assistance: Limited assistance   Dressing Assistance: Limited assistance      Functional Limitations Info                SPECIAL CARE FACTORS FREQUENCY  PT (By licensed PT)     PT Frequency: 5 OT Frequency: 5           Additional Factors Info  Code Status, Allergies Code Status Info: Full Allergies Info: Other, Prenisone           Current Medications (09/27/2015):  This is the current hospital active medication list Current Facility-Administered Medications  Medication Dose Route Frequency Provider Last Rate Last Dose  . acetaminophen (TYLENOL) tablet 500 mg  500 mg Oral Q6H PRN Clydie Braun, MD   500 mg at 09/25/15 1144  . amiodarone (PACERONE) tablet 200 mg  200 mg Oral Daily Clydie Braun, MD   200 mg at 09/27/15 0925  . apixaban (ELIQUIS) tablet 2.5 mg  2.5 mg Oral BID Clydie Braun, MD   2.5 mg at 09/27/15 0926  . calcium-vitamin D (OSCAL WITH D) 500-200 MG-UNIT per tablet 1 tablet  1 tablet Oral Q breakfast Costin Otelia Sergeant, MD   1 tablet at 09/27/15 [phone]  . docusate sodium (COLACE) capsule 100 mg  100 mg Oral BID Clydie Braun, MD   100 mg at 09/27/15 0925  . ferrous sulfate tablet 325  mg  325 mg Oral BID WC Leatha Gilding, MD   325 mg at 09/27/15 1610  . furosemide (LASIX) tablet 80 mg  80 mg Oral Daily Clydie Braun, MD   80 mg at 09/27/15 9604  . hydrALAZINE (APRESOLINE) tablet 10 mg  10 mg Oral BID Clydie Braun, MD   10 mg at 09/27/15 0925  . HYDROcodone-acetaminophen (NORCO/VICODIN) 5-325 MG per tablet 0.5-1 tablet  0.5-1 tablet Oral Q4H PRN Clydie Braun, MD   1 tablet at 09/27/15 0934  . HYDROcodone-homatropine (HYCODAN) 5-1.5 MG/5ML syrup 5 mL  5 mL Oral Q8H PRN Rondell A Katrinka Blazing, MD      . insulin aspart (novoLOG) injection 0-5 Units  0-5 Units Subcutaneous QHS Leanne Chang, NP   5 Units at 09/26/15 2154  . insulin aspart (novoLOG) injection 0-9 Units  0-9 Units Subcutaneous TID WC Leanne Chang, NP   3 Units at 09/27/15 1218  . isosorbide mononitrate (IMDUR) 24 hr tablet 15 mg  15 mg Oral Daily Clydie Braun, MD   15 mg at 09/27/15 0925  . metolazone (ZAROXOLYN) tablet 2.5 mg  2.5 mg Oral Daily PRN Clydie Braun, MD      . milrinone (PRIMACOR) 20 MG/100ML (0.2 mg/mL) infusion  0.25 mcg/kg/min (Order-Specific) Intravenous Continuous Clydie Braun, MD 4.4 mL/hr at 09/26/15 1705 0.25 mcg/kg/min at 09/26/15 1705  . ondansetron (ZOFRAN) tablet 4 mg  4 mg Oral Q6H PRN Clydie Braun, MD       Or  . ondansetron (ZOFRAN) injection 4 mg  4 mg Intravenous Q6H PRN Rondell A Smith, MD      . polyethylene glycol (MIRALAX / GLYCOLAX) packet 17 g  17 g Oral Daily Belkys A Regalado, MD   17 g at 09/27/15 0926  . sodium chloride 0.9 % injection 10-40 mL  10-40 mL Intracatheter Q12H Costin Otelia Sergeant, MD   10 mL at 09/25/15 1000  . sodium chloride 0.9 % injection 10-40 mL  10-40 mL Intracatheter PRN Leatha Gilding, MD   10 mL at 09/26/15 0445     Discharge Medications: Please see discharge summary for a list of discharge medications.  Relevant Imaging Results:  Relevant Lab Results:  Recent Labs    Additional Information SSN:  [id number] [place], [place]

## 2015-09-27 NOTE — Care Management (Addendum)
Patient is on Milrinone Drip managed by [facility] Home infusion Team. Jeri Modena at [facility] was notified concerning patient's stay in the ED. SW consult place to follow up on securing placement in the am.

## 2015-09-27 NOTE — ED Notes (Signed)
  CBG 286  

## 2015-09-27 NOTE — Progress Notes (Signed)
Review of Glycemic Control  Results for Joanna [name], Joanna [name] (MRN [id number]) as of 09/27/2015 07:27  Ref. Range 09/26/2015 08:03 09/26/2015 12:09 09/26/2015 16:48 09/26/2015 21:11 09/27/2015 06:01  Glucose-Capillary Latest Ref Range: 65-99 mg/dL 588 (H) 325 (H) 498 (H) 359 (H) 260 (H)   Diabetes history: DM2 Outpatient Diabetes medications: Humalog 0-9 units BID Current orders for Inpatient glycemic control: Novolog 0-9 units tid, Novolog 0-5 units qhs  Inpatient Diabetes Program Recommendations:  Insulin - Basal: Glucose ranged from 157-359 mg/dl on 26/4/15 and patient received a total of Novolog 18 units for correction on 09/25/15.  Please consider ordering low dose basal insulin; recommend starting with Lantus 12 units QHS (based on 59 kg x 0.2 units).   2 hour post partum blood sugars are elevated - consider adding 3 units Novolog tid with meals.   HgbA1C: Please consider ordering an A1C to evaluate glycemic control over the past 2-3 months.  Note: In reviewing the chart, noted patient use to be on Toujeo 20 units and it was discontinued in June 2016 when patient was discharged from hospital on 04/09/15.  Susette Racer, RN, BA, MHA, CDE Diabetes Coordinator Inpatient Diabetes Program  [phone] (Team Pager) [phone] [place] Office) 09/27/2015 7:32 AM

## 2015-09-27 NOTE — Care Management (Signed)
Per Pam at [facility], admission time at [facility] will be around 2 PM tomorrow ED SW will follow up.

## 2015-09-27 NOTE — Progress Notes (Signed)
Physical Therapy Treatment Patient Details Name: Joanna [name] MRN: [id number] DOB: 1930-04-23 Today's Date: 09/27/2015    History of Present Illness [name] is a 81 year old female with a past medical history significant for hypertension, hyperlipidemia, systolic congestive heart failure last EF 20-25% 4/16, s/p aortic and mitral valve replacements, and diabetes mellitus type 2; who presents after a fall at home with complaints of left hip pain. Xrays showed left superior and inferior pubic ramus fractures.    PT Comments    Pt continues to make progress with mobility and is a MinA for transfers at this time, but requires increased A for attempting ambulation due to pain and fatigue.  Pt would benefit from SNF at D/C to maximize independence prior to returning to home.  Will continue to follow.    Follow Up Recommendations  SNF     Equipment Recommendations  Rolling walker with 5" wheels;Wheelchair (measurements PT);Wheelchair cushion (measurements PT)    Recommendations for Other Services       Precautions / Restrictions Precautions Precautions: Fall Restrictions Weight Bearing Restrictions: Yes LLE Weight Bearing: Weight bearing as tolerated    Mobility  Bed Mobility               General bed mobility comments: pt in recliner  Transfers Overall transfer level: Needs assistance Equipment used: Rolling walker (2 wheeled) Transfers: Sit to/from Stand Sit to Stand: Min assist         General transfer comment: cues for UE use and positioning of L LE.  A with power up to standing and controlling descent to sitting.    Ambulation/Gait Ambulation/Gait assistance: Min assist;+2 physical assistance Ambulation Distance (Feet): 4 Feet Assistive device: Rolling walker (2 wheeled) Gait Pattern/deviations: Step-to pattern;Shuffle     General Gait Details: pt unable to unweight LEs to take a step and merely slides each LE forward.  pt very labored with mobility and  fatigues quickly.     Stairs            Wheelchair Mobility    Modified Rankin (Stroke Patients Only)       Balance Overall balance assessment: Needs assistance Sitting-balance support: No upper extremity supported;Feet supported Sitting balance-Leahy Scale: Poor Sitting balance - Comments: leaning towards R due to L hip pain Postural control: Right lateral lean Standing balance support: Bilateral upper extremity supported;During functional activity Standing balance-Leahy Scale: Poor                      Cognition Arousal/Alertness: Awake/alert Behavior During Therapy: WFL for tasks assessed/performed Overall Cognitive Status: Within Functional Limits for tasks assessed                      Exercises General Exercises - Lower Extremity Ankle Circles/Pumps: 10 reps;AROM;Both Quad Sets: AROM;Both;10 reps Gluteal Sets: AROM;Both;10 reps Long Arc Quad: AROM;Both;10 reps    General Comments        Pertinent Vitals/Pain Pain Assessment: 0-10 Pain Score: 8  Pain Location: L LE during mobility Pain Descriptors / Indicators: Grimacing Pain Intervention(s): Monitored during session;Premedicated before session;Repositioned    Home Living                      Prior Function            PT Goals (current goals can now be found in the care plan section) Acute Rehab PT Goals Patient Stated Goal: walk PT Goal Formulation: With patient Time For  Goal Achievement: 10/09/15 Potential to Achieve Goals: Good Progress towards PT goals: Progressing toward goals    Frequency  Min 4X/week    PT Plan Current plan remains appropriate    Co-evaluation             End of Session Equipment Utilized During Treatment: Gait belt Activity Tolerance: Patient tolerated treatment well Patient left: in chair;with call bell/phone within reach;with chair alarm set;with family/visitor present     Time: 6962-9528 PT Time Calculation (min) (ACUTE  ONLY): 30 min  Charges:  $Gait Training: 8-22 mins $Therapeutic Exercise: 8-22 mins                    G CodesSunny [name], Joanna [name] 09/27/2015, 3:15 PM

## 2015-09-27 NOTE — ED Notes (Addendum)
Pt was discharged earlier today from 3E with left pelvis/leg injury.  Pt was taken home by PTAR from hospital. Upon arrival to patient's home, hospital equipment was not at house and daughter is unable to take care of patient. Pt is awaiting rehab placement tomorrow.

## 2015-09-27 NOTE — Progress Notes (Signed)
Principal Problem:  Fracture of left pelvis (HCC)  Cm spoke to the patient at the bedside who said that after getting home and not having her equipment, her daughter did not feel they could manage safely overnight so had her taken back to the ER.   Cm called and spoke with daughter, Joanna [name], @ [phone]. Patient was discharged home from 3E earlier today post left leg and pelvic injury. Patient normally lives at home with daughter and was discharged home today to await SNF bed availability as she will be private pay at [facility] nursing and Rehab. Daughter states that she just spoke with SW Lupita Leash who told her to go by the SNF in the morning to private pay and secure the bed.  Daughter said that the equipment from [facility] never arrived. SW following for SNF placement and CM will remain available for any additional discharge planning needs.

## 2015-09-27 NOTE — ED Provider Notes (Signed)
CSN: [id number]     Arrival date & time 09/27/15  2045 History   First MD Initiated Contact with Patient 09/27/15 2107     Chief Complaint  Patient presents with  . Fall     (Consider location/radiation/quality/duration/timing/severity/associated sxs/prior Treatment) HPI   81 year old female presenting from home. Patient was just discharged from the hospital today after being admitted with a pelvic fracture. Patient went home but apparently did not have required equipment available and daughter was unable to take care of the patient so came back. Apparently has place at [place] and can go tomorrow. She denies any new complaints since left hospital.   Past Medical History  Diagnosis Date  . Peripheral artery disease (HCC)   . Hyperlipidemia        . Arthritis   . Aortic stenosis      a. s/p AVR  . BUNDLE BRANCH BLOCK, LEFT    . CAROTID ARTERY STENOSIS 01/05/2009  . CVA 03/14/2010  . PERIPHERAL VASCULAR DISEASE 07/30/2007  . Chronic combined systolic and diastolic CHF (congestive heart failure) (HCC)     a. RHC 09/2011 RA 8, RV 58/2/11; PA 54/22 (37) PCWP 20; F CO/CI 4.57/2.67 PVR 3.7; b. L main no sig dz, LAD luminal irreg; LCx lg Ramus with luminal irreg, small AV Lcx witout sig dz, RCA luminal irreg; severe mitral annular calcifications; AV calcified c. EF 45-50% (07/2013);  d. 11/2014 Echo: EF 20-25%, diff HK, nl AV/MV, sev dil LA, mod TR/PR, PASP .  . Diabetes mellitus   . HTN (hypertension)   . Iron deficiency anemia 09/21/2011  . History of shingles   . Mitral regurgitation     a. s/p MVR  . S/P aortic valve replacement with bioprosthetic valve 06/15/2013    21 mm [facility] Ease bovine pericardial tissue valve  . S/P mitral valve replacement with bioprosthetic valve 06/15/2013    25 mm [facility] Mitral bovine pericardial tissue valve  . Fracture, fibula, shaft 01/30/2015    right   Past Surgical History  Procedure Laterality Date  . Carotid endarterectomy  Left 2011  . Polypectomy  12/27/04  . Cataract extraction Bilateral 2009  . Tee without cardioversion N/A 05/19/2013    Procedure: TRANSESOPHAGEAL ECHOCARDIOGRAM (TEE);  Surgeon: Laurey Morale, MD;  Location: [facility] ENDOSCOPY;  Service: Cardiovascular;  Laterality: N/A;  . Colonoscopy    . Cardiac catheterization  2012  . Aortic valve replacement N/A 06/15/2013    Procedure: AORTIC VALVE REPLACEMENT (AVR);  Surgeon: Purcell Nails, MD;  Location[facility] OR;  Service: Open Heart Surgery;  Laterality: N/A;  . Intraoperative transesophageal echocardiogram N/A 06/15/2013    Procedure: INTRAOPERATIVE TRANSESOPHAGEAL ECHOCARDIOGRAM;  Surgeon: Purcell Nails, MD;  Location: [facility] OR;  Service: Open Heart Surgery;  Laterality: N/A;  . Mitral valve replacement N/A 06/15/2013    Procedure: MITRAL VALVE (MV) REPLACEMENT;  Surgeon: Purcell Nails, MD;  Location: [facility] OR;  Service: Open Heart Surgery;  Laterality: N/A;  . Epicardial pacing lead placement N/A 06/15/2013    Procedure: EPICARDIAL PACING LEAD PLACEMENT;  Surgeon: Purcell Nails, MD;  Location: [facility] OR;  Service: Open Heart Surgery;  Laterality: N/A;  . Left and right heart catheterization with coronary angiogram N/A 09/19/2011    Procedure: LEFT AND RIGHT HEART CATHETERIZATION WITH CORONARY ANGIOGRAM;  Surgeon: Dolores Patty, MD;  Location: [facility] CATH LAB;  Service: Cardiovascular;  Laterality: N/A;  . Orif ankle fracture Right 01/31/2015    Procedure: OPEN REDUCTION INTERNAL FIXATION (  ORIF) ANKLE FRACTURE;  Surgeon: Eldred Manges, MD;  Location: [facility] OR;  Service: Orthopedics;  Laterality: Right;  . Ep implantable device N/A 03/21/2015    Procedure: BiV Pacemaker Insertion CRT-P;  Surgeon: Duke Salvia, MD;  Location: [facility] INVASIVE CV LAB;  Service: Cardiovascular;  Laterality: N/A;  . Pacemaker insertion     Family History  Problem Relation Age of Onset  . Colon cancer Mother   . Diabetes Neg Hx   . Coronary artery disease Neg Hx    Social History   Substance Use Topics  . Smoking status: Never Smoker   . Smokeless tobacco: Never Used  . Alcohol Use: No   OB History    No data available     Review of Systems  All systems reviewed and negative, other than as noted in HPI.   Allergies  Other and Prednisone  Home Medications   Prior to Admission medications   Medication Sig Start Date End Date Taking? Authorizing Provider  acetaminophen (TYLENOL) 500 MG tablet Take 500 mg by mouth every 6 (six) hours as needed (pain).    Yes Historical Provider, MD  amiodarone (PACERONE) 200 MG tablet Take 1 tablet (200 mg total) by mouth daily. 06/28/15  Yes Dolores Patty, MD  apixaban (ELIQUIS) 2.5 MG TABS tablet Take 1 tablet (2.5 mg total) by mouth 2 (two) times daily. 06/21/15  Yes Laurey Morale, MD  calcium-vitamin D (OSCAL WITH D) 500-200 MG-UNIT tablet Take 1 tablet by mouth daily with breakfast. 09/27/15  Yes Christiane Ha, MD  docusate sodium (COLACE) 100 MG capsule Take 1 capsule (100 mg total) by mouth 2 (two) times daily. 09/27/15  Yes Christiane Ha, MD  ferrous sulfate 325 (65 FE) MG tablet Take 1 tablet (325 mg total) by mouth 2 (two) times daily with a meal. 02/28/14  Yes Etta Grandchild, MD  furosemide (LASIX) 80 MG tablet Take 1 tablet (80 mg total) by mouth daily. 04/18/15  Yes Laurey Morale, MD  hydrALAZINE (APRESOLINE) 10 MG tablet Take 10 mg by mouth 2 (two) times daily.   Yes Historical Provider, MD  HYDROcodone-acetaminophen (NORCO/VICODIN) 5-325 MG tablet Take 0.5-1 tablets by mouth every 4 (four) hours as needed for moderate pain. 09/27/15  Yes Christiane Ha, MD  insulin detemir (LEVEMIR) 100 UNIT/ML injection Inject 0.05 mLs (5 Units total) into the skin at bedtime. 09/27/15  Yes Christiane Ha, MD  Insulin Lispro, Human, (HUMALOG KWIKPEN) 200 UNIT/ML SOPN Inject 0-9 Units into the skin 4 (four) times daily as needed. Patient taking differently: Inject 0-9 Units into the skin 2 (two) times daily.  06/05/15   Yes Etta Grandchild, MD  isosorbide mononitrate (IMDUR) 30 MG 24 hr tablet Take 0.5 tablets (15 mg total) by mouth daily. 08/14/15  Yes Dolores Patty, MD  metolazone (ZAROXOLYN) 2.5 MG tablet Take 1 tablet by mouth  daily as needed for more  than 3 pound weight gain in 1 day or 5 pounds in a week 09/17/15  Yes Dolores Patty, MD  metolazone (ZAROXOLYN) 5 MG tablet Take 5 mg by mouth as needed (for weight 132 or greater).    Yes Historical Provider, MD  milrinone (PRIMACOR) 20 MG/100ML SOLN infusion Inject 14.575 mcg/min into the vein continuous. 09/27/15  Yes Christiane Ha, MD  polyethylene glycol (MIRALAX / GLYCOLAX) packet Take 17 g by mouth daily. 09/27/15  Yes Christiane Ha, MD  sodium chloride 0.9 % SOLN with milrinone  1 MG/ML SOLN 200 mcg/mL Inject 0.25 mcg/kg/min into the vein continuous.   Yes Historical Provider, MD   BP 118/64 mmHg  Pulse 82  Temp(Src) 98.1 F (36.7 C) (Oral)  SpO2 94% Physical Exam  Constitutional: She appears well-developed and well-nourished. No distress.  HENT:  Head: Normocephalic and atraumatic.  Eyes: Conjunctivae are normal. Right eye exhibits no discharge. Left eye exhibits no discharge.  Neck: Neck supple.  Cardiovascular: Normal rate, regular rhythm and normal heart sounds.  Exam reveals no gallop and no friction rub.   No murmur heard. RUE PICC with drainage, erythema or other concerning skin changes.   Pulmonary/Chest: Effort normal and breath sounds normal. No respiratory distress.  Abdominal: Soft. She exhibits no distension. There is no tenderness.  Musculoskeletal: She exhibits no edema or tenderness.  Neurological: She is alert.  Skin: Skin is warm and dry.  Psychiatric: She has a normal mood and affect. Her behavior is normal. Thought content normal.  Nursing note and vitals reviewed.   ED Course  Procedures (including critical care time) Labs Review Labs Reviewed  CBG MONITORING, ED - Abnormal; Notable for the  following:    Glucose-Capillary 286 (*)    All other components within normal limits    Imaging Review No results found. I have personally reviewed and evaluated these images and lab results as part of my medical decision-making.   EKG Interpretation None      MDM   Final diagnoses:  Fracture of pelvis, closed, with routine healing, subsequent encounter    81 year old female just discharged from the hospital today and required home equipment has not been delivered yet. She has placement at [facility] arranged for tomorrow. We'll keep the emergency room overnight until she can get transportation in the morning.    Raeford Razor, MD 09/27/15 [phone]

## 2015-09-28 ENCOUNTER — Emergency Department (EMERGENCY_DEPARTMENT_HOSPITAL): Payer: Medicare Other

## 2015-09-28 ENCOUNTER — Telehealth: Payer: Self-pay | Admitting: *Deleted

## 2015-09-28 DIAGNOSIS — S3289XD Fracture of other parts of pelvis, subsequent encounter for fracture with routine healing: Secondary | ICD-10-CM | POA: Diagnosis not present

## 2015-09-28 DIAGNOSIS — M79609 Pain in unspecified limb: Secondary | ICD-10-CM | POA: Diagnosis not present

## 2015-09-28 LAB — CBC WITH DIFFERENTIAL/PLATELET
BASOS ABS: 0 10*3/uL (ref 0.0–0.1)
BASOS PCT: 0 %
EOS ABS: 0.2 10*3/uL (ref 0.0–0.7)
EOS PCT: 2 %
HCT: 36.5 % (ref 36.0–46.0)
Hemoglobin: 11.5 g/dL — ABNORMAL LOW (ref 12.0–15.0)
Lymphocytes Relative: 7 %
Lymphs Abs: 0.7 10*3/uL (ref 0.7–4.0)
MCH: 29.9 pg (ref 26.0–34.0)
MCHC: 31.5 g/dL (ref 30.0–36.0)
MCV: 94.8 fL (ref 78.0–100.0)
MONO ABS: 0.5 10*3/uL (ref 0.1–1.0)
Monocytes Relative: 6 %
NEUTROS ABS: 8.1 10*3/uL — AB (ref 1.7–7.7)
Neutrophils Relative %: 85 %
PLATELETS: 197 10*3/uL (ref 150–400)
RBC: 3.85 MIL/uL — ABNORMAL LOW (ref 3.87–5.11)
RDW: 15 % (ref 11.5–15.5)
WBC: 9.5 10*3/uL (ref 4.0–10.5)

## 2015-09-28 LAB — BASIC METABOLIC PANEL
ANION GAP: 10 (ref 5–15)
BUN: 60 mg/dL — ABNORMAL HIGH (ref 6–20)
CALCIUM: 9.5 mg/dL (ref 8.9–10.3)
CO2: 28 mmol/L (ref 22–32)
CREATININE: 2.08 mg/dL — AB (ref 0.44–1.00)
Chloride: 98 mmol/L — ABNORMAL LOW (ref 101–111)
GFR, EST AFRICAN AMERICAN: 24 mL/min — AB (ref >60)
GFR, EST NON AFRICAN AMERICAN: 21 mL/min — AB (ref >60)
Glucose, Bld: 263 mg/dL — ABNORMAL HIGH (ref 65–99)
Potassium: 3.7 mmol/L (ref 3.5–5.1)
SODIUM: 136 mmol/L (ref 135–145)

## 2015-09-28 LAB — CBG MONITORING, ED
Glucose-Capillary: 182 mg/dL — ABNORMAL HIGH (ref 65–99)
Glucose-Capillary: 262 mg/dL — ABNORMAL HIGH (ref 65–99)

## 2015-09-28 NOTE — ED Notes (Signed)
Family at bedside. 

## 2015-09-28 NOTE — Discharge Planning (Signed)
NCM approached by pt son Onalee Hua) regarding mother's previous stay...states he and sister are confused as to why mother was in observation.  NCM reviewed chart to see that pt intensity of care warranted observation status; explained that observation pts are reviewed daily by NCM and Medical Advisor; reminded pt that his sister was given notification of observation status Onalee Hua nodded and says she still has that paperwork) with reasoning behind it.  Onalee Hua asked how to appeal, NCM stated she would consult management for direction in this matter.

## 2015-09-28 NOTE — ED Provider Notes (Addendum)
Patient is assessed by myself for medical clearance for nursing home placement. She has returned to the hospital after discharge yesterday for nursing home placement with pelvic fractures requiring increased level of care and assistance. Patient does not have any acute complaints. She is alert and nontoxic. She is denying headache, chest pain, shortness of breath, abdominal pain. Ports all of her pain is localized into her left hip and pelvis region. She states that it's preventing her from being able to get up to the bedside or even roll over in bed by herself. She reports she is getting pain medication every 4 hours which is helping. She states she has had decreased bowel movement with last bowel movement 2 days ago. She denies any associated abdominal pain. She reports she is urinating well. No pain burning or urgency. She states that she is eating and drinking without difficulty. There is crackers and drink at her bedside.  10 Systems reviewed and are negative for acute change except as noted in the HPI.  On physical examination the patient is alert and nontoxic. She is well appearance. Color is good and there is no respiratory distress. It is normocephalic atraumatic. Heart is regular without gross rub murmur gallop. Lungs have good symmetric air flow without gross wheeze rhonchi rail. Abdomen is soft and nontender. Patient does endorse pain with attempted range of motion of the left lower extremity. Palpation of the calf on the left reveals localizing tenderness within the body of the calf muscle. There is no gross edema. Patient does have superficial varicosities. Both feet are warm and dry without edema. Neurologically the patient is alert and oriented and follows all commands without difficulty. Skin is warm and dry and color is good.  At this time to complete medical clearance, I will have a redraw on the patient's Chem-7 and CBC as well as placed an order for a lower extremity venous Doppler to rule  out any DVT with some localizing tenderness. Patient is otherwise in excellent condition at this point time and clinically appears well.  Arby Barrette, MD 09/28/15 1244  The lower externally ultrasound does not show any DVT present. Patient is cleared from perspective of reported pain in her left calf. This is likely associated with her injury and muscle skeletal pain with positioning in the bed.  Patient has chronic renal insufficiency. She has some elevation of BUN and creatinine. She is not showing any increasing signs of congestive heart failure. At this time recommendation will be for 2 days of decreasing her Lasix to 40 mg and then rechecking renal function. The potassium is in normal range without any elevation. At this time it appears appropriate for her to continue all of her other prescribed medications as her vital signs are stable and clinical condition is good.  Patient is medically cleared for transfer to nursing home care for failure of ADLs with pubic ramus fracture and other medical comorbidities. Recommendations for close follow-up with the treating provider at the facility as well as management with her cardiologist on an as-needed basis.  Arby Barrette, MD 09/28/15 [phone]

## 2015-09-28 NOTE — ED Notes (Signed)
Spoke with Lovette Cliche social worker regarding placement of patient at [facility] today.  Requests that day shift calls her for further instruction on getting patient transferred at [phone].

## 2015-09-28 NOTE — Progress Notes (Signed)
Preliminary results by tech - Left Lower Ext. Venous Duplex Completed. No evidence of deep or superficial vein thrombosis.  Marilynne Halsted, BS, RDMS, RVT

## 2015-09-28 NOTE — ED Notes (Signed)
Vascular at bedside for doppler study

## 2015-09-28 NOTE — ED Notes (Signed)
Joanna [name], CSW called advising the ambulance service has been contacted for the patient to go to [facility], Lupita Leash advised that all paperwork patient needs for transport to facility will be tubed down to the ED and that patient does not need to sign anything before leaving.

## 2015-09-28 NOTE — ED Notes (Signed)
Dr. Pfeiffer at bedside   

## 2015-09-28 NOTE — ED Notes (Signed)
Dr. Renato Gails at bedside.

## 2015-09-28 NOTE — ED Notes (Signed)
Patient CBG was 182 the Nurse was informed. 

## 2015-09-28 NOTE — ED Notes (Signed)
Patient was given some gram crackers and ginger ale. A regular diet order taken for lunch.

## 2015-09-28 NOTE — ED Notes (Signed)
Ambulance service here to transport patient.

## 2015-09-28 NOTE — Telephone Encounter (Signed)
Pt was on TCM list admitted for fracture of (L) pelvis. D/C 09/26/15 will be f/u with Dr. Eulah Pont in 2 weeks...Raechel Chute

## 2015-09-28 NOTE — Discharge Instructions (Signed)
Patient has fracture of the pubic rami.  Decreased daily Lasix dose to 40 mg for 2 days and recheck renal function. Continue other regular medications.   Chronic Kidney Disease Chronic kidney disease occurs when the kidneys are damaged over a long period. The kidneys are two organs that lie on either side of the spine between the middle of the back and the front of the abdomen. The kidneys:  Remove wastes and extra water from the blood.  Produce important hormones. These help keep bones strong, regulate blood pressure, and help create red blood cells.  Balance the fluids and chemicals in the blood and tissues. A small amount of kidney damage may not cause problems, but a large amount of damage may make it difficult or impossible for the kidneys to work the way they should. If steps are not taken to slow down the kidney damage or stop it from getting worse, the kidneys may stop working permanently. Most of the time, chronic kidney disease does not go away. However, it can often be controlled, and those with the disease can usually live normal lives. CAUSES The most common causes of chronic kidney disease are diabetes and high blood pressure (hypertension). Chronic kidney disease may also be caused by:  Diseases that cause the kidneys' filters to become inflamed.  Diseases that affect the immune system.  Genetic diseases.  Medicines that damage the kidneys, such as anti-inflammatory medicines.  Poisoning or exposure to toxic substances.  A reoccurring kidney or urinary infection.  A problem with urine flow. This may be caused by:  Cancer.  Kidney stones.  An enlarged prostate in males. SIGNS AND SYMPTOMS Because the kidney damage in chronic kidney disease occurs slowly, symptoms develop slowly and may not be obvious until the kidney damage becomes severe. A person may have a kidney disease for years without showing any symptoms. Symptoms can include:  Swelling (edema) of the legs,  ankles, or feet.  Tiredness (lethargy).  Nausea or vomiting.  Confusion.  Problems with urination, such as:  Decreased urine production.  Frequent urination, especially at night.  Frequent accidents in children who are potty trained.  Muscle twitches and cramps.  Shortness of breath.  Weakness.  Persistent itchiness.  Loss of appetite.  Metallic taste in the mouth.  Trouble sleeping.  Slowed development in children.  Short stature in children. DIAGNOSIS Chronic kidney disease may be detected and diagnosed by tests, including blood, urine, imaging, or kidney biopsy tests. TREATMENT Most chronic kidney diseases cannot be cured. Treatment usually involves relieving symptoms and preventing or slowing the progression of the disease. Treatment may include:  A special diet. You may need to avoid alcohol and foods thatare salty and high in potassium.  Medicines. These may:  Lower blood pressure.  Relieve anemia.  Relieve swelling.  Protect the bones. HOME CARE INSTRUCTIONS  Follow your prescribed diet. Your health care provider may instruct you to limit daily salt (sodium) and protein intake.  Take medicines only as directed by your health care provider. Do not take any new medicines (prescription, over-the-counter, or nutritional supplements) unless approved by your health care provider. Many medicines can worsen your kidney damage or need to have the dose adjusted.   Quit smoking if you smoke. Talk to your health care provider about a smoking cessation program.  Keep all follow-up visits as directed by your health care provider.  Monitor your blood pressure.  Start or continue an exercise plan.  Get immunizations as directed by your health  care provider.  Take vitamin and mineral supplements as directed by your health care provider. SEEK IMMEDIATE MEDICAL CARE IF:  Your symptoms get worse or you develop new symptoms.  You develop symptoms of  end-stage kidney disease. These include:  Headaches.  Abnormally dark or light skin.  Numbness in the hands or feet.  Easy bruising.  Frequent hiccups.  Menstruation stops.  You have a fever.  You have decreased urine production.  You havepain or bleeding when urinating. MAKE SURE YOU:  Understand these instructions.  Will watch your condition.  Will get help right away if you are not doing well or get worse. FOR MORE INFORMATION   American Association of Kidney Patients: BombTimer.gl  National Kidney Foundation: www.kidney.[place]: https://mathis.com/  [employer] [facility]: www.lifeoptions.org and www.kidneyschool.org   This information is not intended to replace advice given to you by your health care provider. Make sure you discuss any questions you have with your health care provider.   Document Released: 07/15/2008 Document Revised: 10/27/2014 Document Reviewed: 06/04/2012 Elsevier Interactive Patient Education [employer].

## 2015-09-28 NOTE — ED Notes (Signed)
Lunch tray set up at bedside

## 2015-09-28 NOTE — ED Notes (Signed)
Joanna [name], Social worker contacted, Lupita Leash stated that the patient has a bed at [facility] and they will work on getting transportation set up for her to come over later today.

## 2015-09-29 NOTE — Progress Notes (Signed)
All arrangements were completed yesterday for patient to return home over night and then go to [facility] this afternoon when a bed became available.  Patient was in the hospital- observation status and did not have 3 day qualifying hospital stay.  Daughter and son were agreeable to return home overnight with a bedside commode to be ordered so that they could toilet patient.  Daughter states that she has recently hurt her back in a fall and could not lift her mother.  EMS was arranged last night but daughter indicates that the Bedside Commode was never delivered to the room or to the home. Because she would be unable to get her mother to the hospital- she arranged for EMS to bring her mother to the [facility] ED.  "I couldn't care for her without the bedside commode."   CSW spoke with Toniann Fail- Admissions at [facility]. Bed available at facility after 2 PM.  EMS arranged to transport patient. Notified daughter [name] and patient's nurse. Also discussed with ED Case Manager.  Facility had DC summary and Fl2 from d/c of yesterday.  No further CSW needs identified. CSW signing off.  Lorri Frederick. Jaci Lazier, [place] [phone]

## 2015-09-29 NOTE — Clinical Social Work Placement (Signed)
   CLINICAL SOCIAL WORK PLACEMENT  NOTE  Date:  09/27/2015 Patient Details  Name: Joanna [name] MRN: [id number] Date of Birth: 1929-10-29  Clinical Social Work is seeking post-discharge placement for this patient at the Skilled  Nursing Facility level of care (*CSW will initial, date and re-position this form in  chart as items are completed):  Yes   Patient/family provided with [facility] Clinical Social Work Department's list of facilities offering this level of care within the geographic area requested by the patient (or if unable, by the patient's family).  No (patient on [place]- bed choice is very limited)   Patient/family informed of their freedom to choose among providers that offer the needed level of care, that participate in Medicare, Medicaid or managed care program needed by the patient, have an available bed and are willing to accept the patient.  Yes   Patient/family informed of [facility]'s ownership interest in [facility] and [facility], as well as of the fact that they are under no obligation to receive care at these facilities.  PASRR submitted to EDS on 09/27/15     PASRR number received on 09/27/15     Existing PASRR number confirmed on       FL2 transmitted to all facilities in geographic area requested by pt/family on 09/27/15     FL2 transmitted to all facilities within larger geographic area on       Patient informed that his/her managed care company has contracts with or will negotiate with certain facilities, including the following:        Yes   Patient/family informed of bed offers received.  Patient chooses bed at [facility]     Physician recommends and patient chooses bed at      Patient to be transferred to [facility] on 09/28/15 (DC home overnight and then to SNF tomorrow.).  Patient to be transferred to facility by Ambulance Sharin Mons)     Patient family notified on 09/27/15 of transfer.  Name of  family member notified:  Daughter:  [name]     PHYSICIAN Please prepare priority discharge summary, including medications, Please sign FL2, Please prepare prescriptions     Additional Comment:  Patient d/c'd home 09/27/15 late evening with daughter and support of son for overnight home stay. Will plan to be placed at [facility] on 09/28/15 in the early afternoon once a SNF bed comes available for patient.  She is observation in the hospital and does not have 3 day qualifying stay to for SNF placement and is medically stable today for d/c.  Daughter wants bed at [facility] and is aware of need for d/c from the hospital.  Daughter is very concerned about managing her mother at home overnight due to limited ambulation (patient has a fractured pelvis) and inability to toilet. RNCM arranged for a bedside commode to be ordered for home use to assist family with patient.  Notified [facility]missions staff and arranged for patient to be moved there by EMS around 2:30 pm tomorrow.  Daughter requested late d/c this evening to home  (set up for 7pm pick up) and notified nursing staff. DC summary and paperwork sent to facility for tomorrow.Nursing notified of d/c plan.  No further CSW needs identified. CSW signing off.    _______________________________________________ Darylene Price, LCSW 09/27/2015  5:55 PM

## 2015-09-29 NOTE — Clinical Social Work Note (Signed)
Clinical Social Work Assessment  Patient Details  Name: Joanna [name] MRN: [id number] Date of Birth: 1929-11-03  Date of referral:  09/27/15               Reason for consult:  Facility Placement                Permission sought to share information with:  Family Supports, Oceanographer granted to share information::  Yes, Verbal Permission Granted  Name::     [facility], Daughter- Joanna [name]       Housing/Transportation Living arrangements for the past 2 months:  Single Family Home Source of Information:  Patient, Adult Children Patient Interpreter Needed:  None Criminal Activity/Legal Involvement Pertinent to Current Situation/Hospitalization:  No - Comment as needed Significant Relationships:  Adult Children Lives with:  Adult Children Do you feel safe going back to the place where you live?  Yes Need for family participation in patient care:  Yes (Comment)  Care giving concerns:  Patient has a fractured pelvis and cannot walk.  Daughter indicates that she cannot manage patient at home as she has a hurt back. There is no other family to provide for her care.  PT recommends SNF for short term rehab.     Social Worker assessment / plan:  81 year old, pleasant, alert and oriented female currently in the hospital- observation status from home. She fell resulting in a fractured pelvis.  PT is recommending SNF care but patient does not have a 3 day qualifying hospital stay for SNF admission under Medicare.  Discussed in detail with patient's daughter Joanna [name] who indicates that she cannot manage her mothers' care at home as she has fallen recently and hurt her back.  She is very worried about how her mother's care would be arranged at home as there is not one to take care of her.  Daughter requested a bed search with private pay status and wants [facility]. They will offer for patient but will not have a bed until tomorrow afternoon.  Patient is stable  for d/c today per MD;  Daughter does not wish any other facility but [facility].  Discussed in detail with unit RNCM and arrangements completed for daughter and son to take patient home this evening by EMS and then move her to [facility] tomorrow afternoon once bed is open.  RNCM will arrange for a bedside commode to be delivered.  Daughter feels that with the help of her brother- they can manage patient overnight. Fl2 initiated and referral sent to [facility] for review.    Employment status:  Retired Health and safety inspector:  Medicare (Observation) PT Recommendations:  Skilled Nursing Facility Information / Referral to community resources:  Skilled Nursing Facility  Patient/Family's Response to care:  Daughter is very concerned about her mother's inability to ambulate and remains emphatic that she cannot manage her mom at home as she cannot walk.  Daughter is pleased with the care she is receiving in the hospital and denies any concerns about this.  Patient/Family's Understanding of and Emotional Response to Diagnosis, Current Treatment, and Prognosis:  Patient and daughter both seem to have a strong understanding of her current diagnosis and treatment needs.  It has been difficult for them to understand "observation status" and bed search/placement issues.  Both have maintained very positive, kind attitudes and daughter verbalized strong feelings of gratitude for SW support.    Emotional Assessment Appearance:  Appears stated age, Well-Groomed Attitude/Demeanor/Rapport:   (Appropriate for age and  situation) Affect (typically observed):  Pleasant, Calm, Happy Orientation:  Oriented to Self, Oriented to Place, Oriented to  Time, Oriented to Situation Alcohol / Substance use:  Never Used Psych involvement (Current and /or in the community):  No (Comment)  Discharge Needs  Concerns to be addressed:   (no 3 day qualifying hospital stay for medicare SNF placement) Readmission within the last 30  days:  No Current discharge risk:  Dependent with Mobility Barriers to Discharge:  No Barriers Identified   Darylene Price, LCSW 09/27/2015  1:00 PM

## 2015-10-02 ENCOUNTER — Other Ambulatory Visit (HOSPITAL_COMMUNITY): Payer: Self-pay | Admitting: Internal Medicine

## 2015-10-05 ENCOUNTER — Telehealth (HOSPITAL_COMMUNITY): Payer: Self-pay | Admitting: *Deleted

## 2015-10-05 NOTE — Telephone Encounter (Signed)
Pat at [name] left a VM at 3:40 pm today about pt's wt being elevated today.  She stated pt's wt was 134 lb yesterday and 137.2 today, pt denies SOB.  Attempted to call them back and was on hold and transferred several times after being on the line 15 min they disconnected the call.  Will check on pt on Mon

## 2015-10-11 ENCOUNTER — Encounter: Payer: Self-pay | Admitting: Internal Medicine

## 2015-10-11 ENCOUNTER — Telehealth (HOSPITAL_COMMUNITY): Payer: Self-pay | Admitting: Cardiology

## 2015-10-11 NOTE — Telephone Encounter (Signed)
Called during patients medication infusion bag change,  Vitals- b/p 138/64 hr 97 R 20 T 100.9 Weight 141 lbs   Person requested returned call, left message for additional symptoms/concern etc

## 2015-10-12 ENCOUNTER — Other Ambulatory Visit (HOSPITAL_COMMUNITY): Payer: Self-pay | Admitting: Internal Medicine

## 2015-10-12 ENCOUNTER — Other Ambulatory Visit: Payer: Self-pay | Admitting: Licensed Clinical Social Worker

## 2015-10-12 NOTE — Patient Outreach (Signed)
[facility]) Care Management  10/12/2015  Joanna [name] 1930-09-30 [id number]   Assessment-This CSW is covering for CSW [facility] [employer]. CSW contacted [name] SNF and spoke to social worker and physical therapist. Physical therapist states that she was unable to walk due to her pain in her pelvis when she arrived. However, patient has started to walk this week with her walker and put weight on left leg. Social worker reports that no discharge date has been set as patient has just started to walk this week and staff wishes to continue in gaining progress in physical therapy so that there were will be no danger once she transitions back home.   Plan-CSW will update CSW [employer] [employer].  Dickie La, BSW, MSW, LCSW [employer] [email or web address] Phone: [phone] Fax: [phone]

## 2015-10-12 NOTE — Telephone Encounter (Signed)
Spoke w/Patricia, RN at [facility] she states pt has not been getting Metolazone, gave order for her to give it to pt today and tomorrow and to fax [place] weights again next week per Tonye Becket, NP

## 2015-10-16 ENCOUNTER — Encounter: Payer: Self-pay | Admitting: *Deleted

## 2015-10-18 ENCOUNTER — Other Ambulatory Visit: Payer: Self-pay | Admitting: *Deleted

## 2015-10-18 ENCOUNTER — Encounter: Payer: Self-pay | Admitting: *Deleted

## 2015-10-18 NOTE — Patient Outreach (Signed)
[facility]) Care Management  [facility] Social Work  10/18/2015  SHANEYA SPISAK June 29, 1930 [id number]  Subjective:    "Oh, I'll go back to my daughter's house."  "I'm just here to get some therapy".  Objective:   CSW agreed to follow patient while residing at [facility], [facility]illed Nursing Facility where patient was recently placed to receive short-term rehabilitative services.  Current Medications:  Current Outpatient Prescriptions  Medication Sig Dispense Refill  . acetaminophen (TYLENOL) 500 MG tablet Take 500 mg by mouth every 6 (six) hours as needed (pain).     Marland Kitchen amiodarone (PACERONE) 200 MG tablet Take 1 tablet (200 mg total) by mouth daily. 90 tablet 3  . apixaban (ELIQUIS) 2.5 MG TABS tablet Take 1 tablet (2.5 mg total) by mouth 2 (two) times daily. 180 tablet 3  . calcium-vitamin D (OSCAL WITH D) 500-200 MG-UNIT tablet Take 1 tablet by mouth daily with breakfast.    . docusate sodium (COLACE) 100 MG capsule Take 1 capsule (100 mg total) by mouth 2 (two) times daily. 10 capsule   . ferrous sulfate 325 (65 FE) MG tablet Take 1 tablet (325 mg total) by mouth 2 (two) times daily with a meal. 180 tablet 1  . furosemide (LASIX) 80 MG tablet Take 1 tablet (80 mg total) by mouth daily. 30 tablet 6  . hydrALAZINE (APRESOLINE) 10 MG tablet Take 10 mg by mouth 2 (two) times daily.    Marland Kitchen HYDROcodone-acetaminophen (NORCO/VICODIN) 5-325 MG tablet Take 0.5-1 tablets by mouth every 4 (four) hours as needed for moderate pain. 20 tablet 0  . insulin detemir (LEVEMIR) 100 UNIT/ML injection Inject 0.05 mLs (5 Units total) into the skin at bedtime. 10 mL 11  . Insulin Lispro, Human, (HUMALOG KWIKPEN) 200 UNIT/ML SOPN Inject 0-9 Units into the skin 4 (four) times daily as needed. (Patient taking differently: Inject 0-9 Units into the skin 2 (two) times daily. ) 9 mL 3  . isosorbide mononitrate (IMDUR) 30 MG 24 hr tablet Take 0.5 tablets (15 mg  total) by mouth daily. 45 tablet 6  . metolazone (ZAROXOLYN) 2.5 MG tablet Take 1 tablet by mouth  daily as needed for more  than 3 pound weight gain in 1 day or 5 pounds in a week 30 tablet 6  . metolazone (ZAROXOLYN) 5 MG tablet Take 5 mg by mouth as needed (for weight 132 or greater).     . milrinone (PRIMACOR) 20 MG/100ML SOLN infusion Inject 14.575 mcg/min into the vein continuous. 100 mL 0  . polyethylene glycol (MIRALAX / GLYCOLAX) packet Take 17 g by mouth daily. 14 each 0  . sodium chloride 0.9 % SOLN with milrinone 1 MG/ML SOLN 200 mcg/mL Inject 0.25 mcg/kg/min into the vein continuous.     No current facility-administered medications for this visit.    Functional Status:  In your present state of health, do you have any difficulty performing the following activities: 09/24/2015 03/31/2015  Hearing? N Y  Vision? N N  Difficulty concentrating or making decisions? N N  Walking or climbing stairs? Y Y  Dressing or bathing? Y N  Doing errands, shopping? Malvin Johns    Fall/Depression Screening:  PHQ 2/9 Scores 04/19/2015 12/25/2014 12/23/2013 09/05/2013  PHQ - 2 Score 0 0 0 0    Assessment:   CSW was able to make initial contact with patient today to perform phone assessment, as well as assess and assist with social work needs and services.  CSW  introduced self, explained role and types of services provided through [facility] Care Management [facility] Care Management).  CSW further explained to patient that CSW works with Charlesetta Shanks, Hospital Liaison with [facility] Care Management, and individual making referral to CSW. CSW then explained the reason for the call, indicating that Mrs. Brewer thought that patient would benefit from social work services and resources to assist with possible discharge planning needs and services.  Patient currently resides at [facility] to receive short-term rehabilitative services.  CSW obtained two HIPAA compliant  identifiers from patient, which included patient's name and date of birth. Patient was very vague about her discharge plans, reporting that she plans to return to her daughter, Stana Bunting house upon discharge from [facility].  Patient denied having any social work needs at present, reporting that she has already been working with Billey Chang, Licensed Clinical Social Worker/Admissions Coordinator at [facility], regarding possible home care services and/or durable medical equipment.  CSW explained to patient that CSW works closely with Ms. Clelia Croft to ensure that patient has all her discharge planning needs arranged and in place, prior to returning home to live.  CSW went on to say that CSW wants to make sure that patient has a smooth transition from skilled nursing to the home environment.  Patient voiced understanding and was agreeable to CSW working with Ms. Clelia Croft to coordinate her discharge planning arrangements.  Plan:   CSW will plan to meet with patient at [facility], Skilled Nursing Facility where patient currently resides to receive short-term rehabilitative services, to perform initial visit on Thursday, October 24, 2014 at 12:00pm. CSW will converse with Charlesetta Shanks, Hospital Liaison with [facility] Care Management, to report findings of initial phone conversation with patient today. CSW will prescribe and print EMMI information for patient to review at the initial visit. CSW will fax a barriers letter, as well as the initial assessment, to patient's Primary Care Physician, Dr. Sanda Linger to ensure that Dr. Yetta Barre is aware of CSW's involvement with patient's care.  Danford Bad, BSW, MSW, LCSW  Licensed Restaurant manager, [employer] [facility]  Mailing [place] [address] [place], [place] [address] Physical Address-[address], [place], [place] [address] Toll Free Main #  [phone] Fax # [phone] Cell # [phone]  Fax # [phone]  [email or web address]

## 2015-10-19 ENCOUNTER — Telehealth (HOSPITAL_COMMUNITY): Payer: Self-pay

## 2015-10-19 ENCOUNTER — Other Ambulatory Visit (HOSPITAL_COMMUNITY): Payer: Self-pay | Admitting: Internal Medicine

## 2015-10-19 NOTE — Telephone Encounter (Signed)
Elease Hashimoto RN with [facility] rehab called to report weight and measurements on patient. Wt 136.6 lbs Abd Circ 93 cm L ankle 22 cm R ankle 20 cm Reports VSS, patient feels fine and in no distress.  Will continue to follow closely.  Ave Filter

## 2015-10-23 ENCOUNTER — Telehealth (HOSPITAL_COMMUNITY): Payer: Self-pay | Admitting: Student

## 2015-10-23 NOTE — Telephone Encounter (Signed)
Spoke with Daughter, [name] at [place] number listed.    Pt weight has been trending up, otherwise asymptomatic.  Weight reported 136 on 10/19/15, Received metolazone 10/21/15, Weight 140.8 this am.  Denies SOB.  Daughter states pt legs "are blown up like balloons."  Instructed SNF nurse to give metolazone tomorrow am as she has already had lasix this am, will try to get appointment for this week. Weight 132 on 09/18/15 and had been stable in 128-132 range prior to that. Up 8-12 lbs.    Joanna [name] [address] [place], PA-C 10/23/2015 11:45 AM

## 2015-10-24 ENCOUNTER — Ambulatory Visit (HOSPITAL_COMMUNITY)
Admission: RE | Admit: 2015-10-24 | Discharge: 2015-10-24 | Disposition: A | Discharge status: Home / Self Care | Payer: Medicare Other | Source: Ambulatory Visit | Attending: Internal Medicine | Admitting: Internal Medicine

## 2015-10-24 ENCOUNTER — Encounter (HOSPITAL_COMMUNITY): Payer: Self-pay | Admitting: Internal Medicine

## 2015-10-24 VITALS — BP 118/60 | HR 90 | Wt 143.5 lb

## 2015-10-24 DIAGNOSIS — E1122 Type 2 diabetes mellitus with diabetic chronic kidney disease: Secondary | ICD-10-CM | POA: Diagnosis not present

## 2015-10-24 DIAGNOSIS — Z952 Presence of prosthetic heart valve: Secondary | ICD-10-CM | POA: Insufficient documentation

## 2015-10-24 DIAGNOSIS — E785 Hyperlipidemia, unspecified: Secondary | ICD-10-CM | POA: Diagnosis not present

## 2015-10-24 DIAGNOSIS — I080 Rheumatic disorders of both mitral and aortic valves: Secondary | ICD-10-CM | POA: Insufficient documentation

## 2015-10-24 DIAGNOSIS — Z79899 Other long term (current) drug therapy: Secondary | ICD-10-CM | POA: Insufficient documentation

## 2015-10-24 DIAGNOSIS — I739 Peripheral vascular disease, unspecified: Secondary | ICD-10-CM | POA: Insufficient documentation

## 2015-10-24 DIAGNOSIS — I5023 Acute on chronic systolic (congestive) heart failure: Secondary | ICD-10-CM

## 2015-10-24 DIAGNOSIS — Z9889 Other specified postprocedural states: Secondary | ICD-10-CM | POA: Insufficient documentation

## 2015-10-24 DIAGNOSIS — Z794 Long term (current) use of insulin: Secondary | ICD-10-CM | POA: Diagnosis not present

## 2015-10-24 DIAGNOSIS — I480 Paroxysmal atrial fibrillation: Secondary | ICD-10-CM | POA: Diagnosis not present

## 2015-10-24 DIAGNOSIS — I129 Hypertensive chronic kidney disease with stage 1 through stage 4 chronic kidney disease, or unspecified chronic kidney disease: Secondary | ICD-10-CM | POA: Insufficient documentation

## 2015-10-24 DIAGNOSIS — I5032 Chronic diastolic (congestive) heart failure: Secondary | ICD-10-CM | POA: Insufficient documentation

## 2015-10-24 DIAGNOSIS — Z7901 Long term (current) use of anticoagulants: Secondary | ICD-10-CM | POA: Diagnosis not present

## 2015-10-24 DIAGNOSIS — I429 Cardiomyopathy, unspecified: Secondary | ICD-10-CM | POA: Insufficient documentation

## 2015-10-24 DIAGNOSIS — N184 Chronic kidney disease, stage 4 (severe): Secondary | ICD-10-CM | POA: Insufficient documentation

## 2015-10-24 NOTE — Progress Notes (Signed)
Patient ID: Joanna [name], female   DOB: 04-25-1930, 82 y.o.   MRN: [id number]  ADVANCED HF CLINIC NOTE  PCP: Dr Yetta Barre ([facility] [address]) CVTS Surgeon: Dr. Cornelius Moras Primary Cardiologist: Dr. Gala Romney DNR/DNI   HPI: Joanna [name] is a 82 year old woman with aortic stenosis s/p AVR/mitral valve replacement (06/15/2013), systolic HF due to NICM, hypertension, diabetes mellitus type II, hyperlipidemia, and peripheral artery disease.  Underwent AVR/MVR with placement of epicardial lead on 06/15/13 with PAF post-op terminated with amiodarone.  Upgraded to [facility] Jude CRT-D 03/22/2015.    Admitted from 5/27 through 03/30/2015 with acute/chronic systolic HF and cardiogenic shock. She was diuresed with IV lasix and milrinone. Unable to wean milrinone and discharged on 0.25 mcg. While hospitalized CRT-D placed on 6/2. She was discharged to [facility].   Readmitted 6/11 with hypoglycemia. Also had sepsis with HCAP and received antibiotic course. She remained on milrinone 0.375 mcg. Discharge weight was 128 pounds.   Admitted 09/24/15 from facility with fall. Xray showed left hip superior and inferior pubic ramus fracture. She was considered stable from a HF standpoint and compliant with medications. Her weight was 130 lbs, consistent with previous home weights.   Labs from 10/23/15 showed Creatinine had trended up slightly from 1.6 -> 1.8, and in speaking with her daughter pts weight had gone up slowly since going back to [facility] after her hip fracture. Her weight on 10/11/15 was 141 lbs. Appt made for 10/24/15 and instructed to take a metolazone that morning.   She presents today as an add on for weight gain, after calling about increased creatinine as above. Weight 143 lbs, up 12 lbs from previous visit. Daughter says that food is not as monitored at [facility] and thinks salt in diet has a lot to do with fluid gain. Despite weight gain, has been feeling good. She does have mild dyspnea with ADLs. Unclear from records how  often she is getting metolazone, or if it was given this morning as directed. Pt is unsure if she was given a metolazone this morning, is "not peeing like she normally dose after one."   Continues on milrinone with [facility] following with labs every week. Taking medications. Ambulating with a walker. Still in [facility], goes home on 11/05/14. Denies dizziness, lightheadedness, CP, or orthopnea. No bleeding on eliquis.  Of note, pt tears up when talking about how she is treated at [facility].  States she has bruises on her legs from being placed into her bed "roughly". Is very excited to go home later this month, and becomes tearful again when talking about it.  05/19/2013 ECHO EF 35% severe AS (low gradient severe AS confirmed by DSE), gradient: 32 mm Hg (S). Peak gradient: 57mm Hg  08/18/13 ECHO EF 45-50% AV working well 2/16 ECHO with EF 20-25%, moderately dilated LV with mild LVH, normal bioprosthetic mitral and aortic valves, moderate TR, PA systolic pressure 54 mmHg.  4/16 ECHO EF 20-25%, diffuse hypokinesis, mild LVH, bioprosthetic aortic valve functioning normally, bioprosthetic mitral valve with mild central MR and mean gradient 4 mmHg.   Labs 9/14: K 5.4, creatinine 1.69 09/2013: K+ 4.6, creatinine 1.08, pro-BNP 11643 10/17/13: K+ 4.4, creatinine 1.6 11/28/14: K+ 4.1, Creatinine 1.6 12/15/14: K 3.5, creatinine 1.72 4/16: K 4.2, creatinine 1.86, hgb 8.8 5/16: K 4 => 3.1, creatinine 1.68 => 1.61, BNP 4039 => 4185 04/09/2015: K 3.4 Creatinine 2.01  04/16/2915: K 4.2 Creatinine 1.8 Hgb 10.3  06/11/2015: K 4.1 Creatinine 1.9 07/24/15: K 4.5 Creatinine 1.8 WBC  7.2 08/21/2015: K 3.9 Creatinine 1.7    ROS:  Negative except for as mentioned in HPI and problem list.  Past Medical History  Diagnosis Date  . Peripheral artery disease (HCC)   . Hyperlipidemia        . Arthritis   . Aortic stenosis      a. s/p AVR  . BUNDLE BRANCH BLOCK, LEFT    . CAROTID ARTERY STENOSIS 01/05/2009  . CVA 03/14/2010    . PERIPHERAL VASCULAR DISEASE 07/30/2007  . Chronic combined systolic and diastolic CHF (congestive heart failure) (HCC)     a. RHC 09/2011 RA 8, RV 58/2/11; PA 54/22 (37) PCWP 20; F CO/CI 4.57/2.67 PVR 3.7; b. L main no sig dz, LAD luminal irreg; LCx lg Ramus with luminal irreg, small AV Lcx witout sig dz, RCA luminal irreg; severe mitral annular calcifications; AV calcified c. EF 45-50% (07/2013);  d. 11/2014 Echo: EF 20-25%, diff HK, nl AV/MV, sev dil LA, mod TR/PR, PASP .  . Diabetes mellitus   . HTN (hypertension)   . Iron deficiency anemia 09/21/2011  . History of shingles   . Mitral regurgitation     a. s/p MVR  . S/P aortic valve replacement with bioprosthetic valve 06/15/2013    21 mm [facility] Ease bovine pericardial tissue valve  . S/P mitral valve replacement with bioprosthetic valve 06/15/2013    25 mm [facility] Mitral bovine pericardial tissue valve  . Fracture, fibula, shaft 01/30/2015    right     Current Outpatient Prescriptions  Medication Sig Dispense Refill  . acetaminophen (TYLENOL) 500 MG tablet Take 500 mg by mouth every 6 (six) hours as needed (pain).     Marland Kitchen amiodarone (PACERONE) 200 MG tablet Take 1 tablet (200 mg total) by mouth daily. 90 tablet 3  . apixaban (ELIQUIS) 2.5 MG TABS tablet Take 1 tablet (2.5 mg total) by mouth 2 (two) times daily. 180 tablet 3  . calcium-vitamin D (OSCAL WITH D) 500-200 MG-UNIT tablet Take 1 tablet by mouth daily with breakfast.    . docusate sodium (COLACE) 100 MG capsule Take 1 capsule (100 mg total) by mouth 2 (two) times daily. 10 capsule   . ferrous sulfate 325 (65 FE) MG tablet Take 1 tablet (325 mg total) by mouth 2 (two) times daily with a meal. 180 tablet 1  . furosemide (LASIX) 80 MG tablet Take 1 tablet (80 mg total) by mouth daily. 30 tablet 6  . hydrALAZINE (APRESOLINE) 10 MG tablet Take 10 mg by mouth 2 (two) times daily.    Marland Kitchen HYDROcodone-acetaminophen (NORCO/VICODIN) 5-325 MG tablet Take 0.5-1 tablets by  mouth every 4 (four) hours as needed for moderate pain. 20 tablet 0  . insulin detemir (LEVEMIR) 100 UNIT/ML injection Inject 0.05 mLs (5 Units total) into the skin at bedtime. 10 mL 11  . Insulin Lispro, Human, (HUMALOG KWIKPEN) 200 UNIT/ML SOPN Inject 0-9 Units into the skin 4 (four) times daily as needed. (Patient taking differently: Inject 0-9 Units into the skin 2 (two) times daily. ) 9 mL 3  . isosorbide mononitrate (IMDUR) 30 MG 24 hr tablet Take 0.5 tablets (15 mg total) by mouth daily. 45 tablet 6  . levothyroxine (SYNTHROID, LEVOTHROID) 50 MCG tablet Take 50 mcg by mouth daily before breakfast.    . metolazone (ZAROXOLYN) 2.5 MG tablet Take 1 tablet by mouth  daily as needed for more  than 3 pound weight gain in 1 day or 5 pounds in a week 30  tablet 6  . metolazone (ZAROXOLYN) 5 MG tablet Take 5 mg by mouth as needed (for weight 132 or greater).     . milrinone (PRIMACOR) 20 MG/100ML SOLN infusion Inject 14.575 mcg/min into the vein continuous. 100 mL 0  . polyethylene glycol (MIRALAX / GLYCOLAX) packet Take 17 g by mouth daily. 14 each 0  . sodium chloride 0.9 % SOLN with milrinone 1 MG/ML SOLN 200 mcg/mL Inject 0.25 mcg/kg/min into the vein continuous.     No current facility-administered medications for this encounter.   Social History   Social History  . Marital Status: Divorced    Spouse Name: N/A  . Number of Children: N/A  . Years of Education: N/A   Social History Main Topics  . Smoking status: Never Smoker   . Smokeless tobacco: Never Used  . Alcohol Use: No  . Drug Use: No  . Sexual Activity: No   Other Topics Concern  . None   Social History Narrative   Patient lives alone here in town   She continues to work, helping to bind and oversew books   She is divorced after 23 years of marriage   1 son- '61, minister; 1 dtr '55; 4 grandchildren   End of life care-provided packet on living well   Family History  Problem Relation Age of Onset  . Colon cancer Mother    . Diabetes Neg Hx   . Coronary artery disease Neg Hx     Filed Vitals:   10/24/15 1401  BP: 118/60  Pulse: 90  Weight: 143 lb 8 oz (65.091 kg)  SpO2: 96%   Wt Readings from Last 3 Encounters:  10/24/15 143 lb 8 oz (65.091 kg)  09/27/15 134 lb 1.6 oz (60.827 kg)  08/30/15 134 lb 4 oz (60.895 kg)    PHYSICAL EXAM: General: Elderly. NAD; Daughter present. Ambulated into the clinic using rollator. HEENT: 1.5 cm nodule between eyes, chronic per daughter.  Neck: supple. JVP 11-12 cm with prominent cv wave. Carotids 2+ bilaterally; no bruits. No thyromegaly or nodule noted. Cor: PMI normal. RRR. 1/6 SEM. +S3.    Lungs: Clear, normal effort. Abdomen: soft, NT, mild distention. No HSM. No bruits or masses. +BS  Extremities: no cyanosis, clubbing, rash.  3+ edema into thighs bilaterally. RUE PICC. Scattered ecchymosis on bilateral LEs. Neuro: alert & orientedx3, cranial nerves grossly intact. Moves all 4 extremities w/o difficulty. Affect pleasant  ASSESSMENT & PLAN: 1. Acute on chronic systolic CHF: EF 16-10% (4/16).  NICM.  On chronic milrinone 0.25 mcg. Continue weekly lab work.  - NYHA III-IIIb on milrinone. PICC site OK. She is markedly volume overloaded on exam. - Will increase lasix to 80 mg BID x 2 days, and then increase chronic regimen to 80 mg qam and 40 mg qpm. Follow up with me in one week.   - Should apply TED hose as she tolerates.  - Continue current dose of hydralazine/imdur. No ARB/ACE with CKD.    2. Carotid stenosis: Dopplers 12/2014 40-59% RICA stenosis stable. Repeat 3/17. 3. Hyperlipidemia: Continue statin 4.  Aortic stenosis/mitral regurgitation: s/p bioprosthetic AVR/MVR stable last echo 01/2015.  5. CKD:   - Continue to follow weekly BMET with [facility].  6. PAF:   - EKG today shows a sensed v paced 89 bpm. Maintaining NSR. - Continue apixaban 2.5 mg BID and amio 200 mg daily.  - No bleeding on Eliquis. 7. St Jude CRT-D -Followed by EP    8. DM - Insulin  dependent. Per PCP.   As above. Lasix 80 mg BID x 2 days, and then increase chronic dose to 80 mg qam/40 mg qpm. Will follow up next week.   Amy Filbert Schilder, NP-C spoke with the Nursing Director at [facility] to advise of medication changes and our recommendations of daily weights and to ask about implementation of low-salt diet and fluid restriction. Also spoke about bruises and concern for rough treatment, and requested the Nursing Director follow up with the patient and her family.   Graciella Freer, PA-C 10/24/2015

## 2015-10-25 ENCOUNTER — Other Ambulatory Visit: Payer: Self-pay | Admitting: *Deleted

## 2015-10-25 NOTE — Patient Outreach (Signed)
[facility]) Care Management  10/25/2015  MAYSEN BLACKWELDER 07/05/1930 [id number]  CSW was able to meet with patient today at [facility] Facility where patient currently resides to receive short-term rehabilitative services, to perform a routine visit, as well as assess and assist with possible discharge planning needs and services.  As soon as CSW inquired about patient's well-being, patient became tearful.  Patient stated, "They have been treating me just awful here".  CSW agreed to voice patient's concerns to Billey Chang, Admissions Coordinator/Licensed Clinical Social Worker at [employer] to report patient's concerns.  However, patient denied, indicating that her daughter, [name] is already aware and will be taking care of it. Patient indicated that she is anxious to return home to live with Mrs. Rosilyn Mings, reporting that she will be released before the end of the month.  CSW agreed to attend patient's discharge planning meeting, scheduled for January 18th, to assess and assist with possible discharge planning needs and services.  Patient encouraged CSW to converse with Mrs. Rosilyn Mings, reporting that she will also be attending the meeting.  CSW continued to converse with patient, offering empathy and supportive services, where appropriate.  Patient admitted that she is "working hard with the therapist" because she is hopeful that she will be able to go home sooner. CSW will meet with patient for a routine visit at [facility], [facility]illed Nursing Facility where patient currently resides to receive short-term rehabilitative services, to attend patient's discharge planning meeting on Wednesday,January 18th at 9:30am. CSW will fax a correspondence letter to patient's Primary Care Physician, Dr. Sanda Linger to ensure that Dr. Yetta Barre is aware of CSW's involvement with patient's care.  Danford Bad,  BSW, MSW, LCSW  Licensed Restaurant manager, fast food [facility]  Mailing [place] [address] [place], [place] [address] Physical Address-[address], [place], [place] [address] Toll Free Main # [phone] Fax # [phone] Cell # [phone]  Fax # [phone]  [email or web address]

## 2015-10-29 ENCOUNTER — Other Ambulatory Visit (HOSPITAL_COMMUNITY): Payer: Self-pay | Admitting: Internal Medicine

## 2015-10-31 ENCOUNTER — Telehealth (HOSPITAL_COMMUNITY): Payer: Self-pay | Admitting: Cardiology

## 2015-10-31 ENCOUNTER — Ambulatory Visit (HOSPITAL_COMMUNITY)
Admission: RE | Admit: 2015-10-31 | Discharge: 2015-10-31 | Disposition: A | Discharge status: Home / Self Care | Payer: Medicare Other | Source: Ambulatory Visit | Attending: Internal Medicine | Admitting: Internal Medicine

## 2015-10-31 VITALS — BP 108/60 | HR 94 | Wt 148.5 lb

## 2015-10-31 DIAGNOSIS — Z953 Presence of xenogenic heart valve: Secondary | ICD-10-CM | POA: Insufficient documentation

## 2015-10-31 DIAGNOSIS — I739 Peripheral vascular disease, unspecified: Secondary | ICD-10-CM | POA: Diagnosis not present

## 2015-10-31 DIAGNOSIS — E1122 Type 2 diabetes mellitus with diabetic chronic kidney disease: Secondary | ICD-10-CM | POA: Insufficient documentation

## 2015-10-31 DIAGNOSIS — I130 Hypertensive heart and chronic kidney disease with heart failure and stage 1 through stage 4 chronic kidney disease, or unspecified chronic kidney disease: Secondary | ICD-10-CM | POA: Diagnosis not present

## 2015-10-31 DIAGNOSIS — Z8673 Personal history of transient ischemic attack (TIA), and cerebral infarction without residual deficits: Secondary | ICD-10-CM | POA: Insufficient documentation

## 2015-10-31 DIAGNOSIS — I5022 Chronic systolic (congestive) heart failure: Secondary | ICD-10-CM | POA: Diagnosis not present

## 2015-10-31 DIAGNOSIS — E785 Hyperlipidemia, unspecified: Secondary | ICD-10-CM | POA: Insufficient documentation

## 2015-10-31 DIAGNOSIS — Z794 Long term (current) use of insulin: Secondary | ICD-10-CM | POA: Diagnosis not present

## 2015-10-31 DIAGNOSIS — N184 Chronic kidney disease, stage 4 (severe): Secondary | ICD-10-CM | POA: Insufficient documentation

## 2015-10-31 DIAGNOSIS — L729 Follicular cyst of the skin and subcutaneous tissue, unspecified: Secondary | ICD-10-CM | POA: Insufficient documentation

## 2015-10-31 DIAGNOSIS — I6521 Occlusion and stenosis of right carotid artery: Secondary | ICD-10-CM | POA: Insufficient documentation

## 2015-10-31 DIAGNOSIS — I5023 Acute on chronic systolic (congestive) heart failure: Secondary | ICD-10-CM | POA: Insufficient documentation

## 2015-10-31 DIAGNOSIS — Z7902 Long term (current) use of antithrombotics/antiplatelets: Secondary | ICD-10-CM | POA: Diagnosis not present

## 2015-10-31 DIAGNOSIS — I428 Other cardiomyopathies: Secondary | ICD-10-CM | POA: Insufficient documentation

## 2015-10-31 DIAGNOSIS — Z79899 Other long term (current) drug therapy: Secondary | ICD-10-CM | POA: Diagnosis not present

## 2015-10-31 DIAGNOSIS — I480 Paroxysmal atrial fibrillation: Secondary | ICD-10-CM

## 2015-10-31 LAB — BASIC METABOLIC PANEL
Anion gap: 11 (ref 5–15)
BUN: 64 mg/dL — ABNORMAL HIGH (ref 6–20)
CALCIUM: 9.4 mg/dL (ref 8.9–10.3)
CO2: 29 mmol/L (ref 22–32)
CREATININE: 2.07 mg/dL — AB (ref 0.44–1.00)
Chloride: 94 mmol/L — ABNORMAL LOW (ref 101–111)
GFR calc Af Amer: 24 mL/min — ABNORMAL LOW (ref >60)
GFR calc non Af Amer: 21 mL/min — ABNORMAL LOW (ref >60)
Glucose, Bld: 440 mg/dL — ABNORMAL HIGH (ref 65–99)
Potassium: 4.3 mmol/L (ref 3.5–5.1)
Sodium: 134 mmol/L — ABNORMAL LOW (ref 135–145)

## 2015-10-31 MED ORDER — FUROSEMIDE 10 MG/ML IJ SOLN: 80.0000 mg | Freq: Once | INTRAMUSCULAR | Status: DC

## 2015-10-31 MED FILL — FUROSEMIDE 10 MG/ML VIAL: 10 | 7 days supply | Qty: 8 | Fill #0

## 2015-10-31 NOTE — Telephone Encounter (Signed)
Aundra Millet, RN with [facility] called with update and fyi Despite patient may needing hospitalization for CHF management, daughter took patient to arbys for curly fries Patient reported she didinot finish them all and could not taste the salt   Patient does follow a low salt diet while a the facility and sodium intake is monitored Nurse also advised family to not bring in snacks high in sodium such as crackers and chips

## 2015-10-31 NOTE — Progress Notes (Signed)
Advanced Heart Failure Medication Review by a Pharmacist  Does the patient  feel that his/her medications are working for him/her?  yes  Has the patient been experiencing any side effects to the medications prescribed?  no  Does the patient measure his/her own blood pressure or blood glucose at home?  yes   Does the patient have any problems obtaining medications due to transportation or finances?   no  Understanding of regimen: good Understanding of indications: good Potential of compliance: excellent Patient understands to avoid NSAIDs. Patient understands to avoid decongestants.  Issues to address at subsequent visits: None   Pharmacist comments:  Joanna [name] is a pleasant 82 yo F presenting with a current MAR from [name]. All medications were reconciled and she did not have any specific medication-related questions or concerns for me at this time.   Tyler Deis. Bonnye Fava, PharmD, BCPS, CPP Clinical Pharmacist Pager: [phone] Phone: [phone] 10/31/2015 10:25 AM      Time with patient: 8 minutes Preparation and documentation time: 2 minutes Total time: 10 minutes

## 2015-10-31 NOTE — Progress Notes (Signed)
Patient ID: TEKEYA GEFFERT, female   DOB: 03/22/1930, 82 y.o.   MRN: [id number]   ADVANCED HF CLINIC NOTE  PCP: Dr Yetta Barre ([facility] on [place]) CVTS Surgeon: Dr. Cornelius Moras Primary Cardiologist: Dr. Gala Romney DNR/DNI   HPI: Ms. Carreno is a 82 year old woman with aortic stenosis s/p AVR/mitral valve replacement (06/15/2013), systolic HF due to NICM, hypertension, diabetes mellitus type II, hyperlipidemia, and peripheral artery disease.  Underwent AVR/MVR with placement of epicardial lead on 06/15/13 with PAF post-op terminated with amiodarone.  Upgraded to [facility] Jude CRT-D 03/22/2015.    Admitted from 5/27 through 03/30/2015 with acute/chronic systolic HF and cardiogenic shock. She was diuresed with IV lasix and milrinone. Unable to wean milrinone and discharged on 0.25 mcg. While hospitalized CRT-D placed on 6/2. She was discharged to [facility].   Readmitted 6/11 with hypoglycemia. Also had sepsis with HCAP and received antibiotic course. She remained on milrinone 0.375 mcg. Discharge weight was 128 pounds.   Admitted 09/24/15 from facility with fall. Xray showed left hip superior and inferior pubic ramus fracture. She was considered stable from a HF standpoint and compliant with medications. Her weight was 130 lbs, consistent with previous home weights.   Labs from 10/23/15 showed Creatinine had trended up slightly from 1.6 -> 1.8, and in speaking with her daughter pts weight had gone up slowly since going back to [facility] after her hip fracture. Her weight on 10/11/15 was 141 lbs. Appt made for 10/24/15 and instructed to take a metolazone that morning.   She presents today for regular follow up. At last visit weight was up 12 lbs from last visit. Told to take lasix 80 mg BID x 2 days, and then increase chronic dose from 80 mg lasix daily to 80 mg q am and 40 mg q pm. We also addressed issue of her treatment at [facility] by speaking with Nursing Director. Her weight shows up 5 lbs by our scale despite change in  diuretics. Had biscuits and gravy this morning. Diet is not monitored as much at [facility] per daughter. SOB continues to worsen, having more difficulty with ADLs. Continues on milrinone provided by [facility] with weekly labs. Taking all medications. Ambulates with a walker. Goes home from [facility] next week, 11/05/14.   05/19/2013 ECHO EF 35% severe AS (low gradient severe AS confirmed by DSE), gradient: 32 mm Hg (S). Peak gradient: 57mm Hg  08/18/13 ECHO EF 45-50% AV working well 2/16 ECHO with EF 20-25%, moderately dilated LV with mild LVH, normal bioprosthetic mitral and aortic valves, moderate TR, PA systolic pressure 54 mmHg.  4/16 ECHO EF 20-25%, diffuse hypokinesis, mild LVH, bioprosthetic aortic valve functioning normally, bioprosthetic mitral valve with mild central MR and mean gradient 4 mmHg.   Labs 04/09/2015: K 3.4 Creatinine 2.01  04/16/2915: K 4.2 Creatinine 1.8 Hgb 10.3  06/11/2015: K 4.1 Creatinine 1.9 07/24/15: K 4.5 Creatinine 1.8 WBC 7.2 08/21/2015: K 3.9 Creatinine 1.7   10/22/15 K 2.8, Creatinine 1.82  ROS:  Negative except for as mentioned in HPI and problem list.  Past Medical History  Diagnosis Date  . Peripheral artery disease (HCC)   . Hyperlipidemia        . Arthritis   . Aortic stenosis      a. s/p AVR  . BUNDLE BRANCH BLOCK, LEFT    . CAROTID ARTERY STENOSIS 01/05/2009  . CVA 03/14/2010  . PERIPHERAL VASCULAR DISEASE 07/30/2007  . Chronic combined systolic and diastolic CHF (congestive heart failure) (HCC)  a. RHC 09/2011 RA 8, RV 58/2/11; PA 54/22 (37) PCWP 20; F CO/CI 4.57/2.67 PVR 3.7; b. L main no sig dz, LAD luminal irreg; LCx lg Ramus with luminal irreg, small AV Lcx witout sig dz, RCA luminal irreg; severe mitral annular calcifications; AV calcified c. EF 45-50% (07/2013);  d. 11/2014 Echo: EF 20-25%, diff HK, nl AV/MV, sev dil LA, mod TR/PR, PASP .  . Diabetes mellitus   . HTN (hypertension)   . Iron deficiency anemia 09/21/2011  . History of  shingles   . Mitral regurgitation     a. s/p MVR  . S/P aortic valve replacement with bioprosthetic valve 06/15/2013    21 mm [facility] Ease bovine pericardial tissue valve  . S/P mitral valve replacement with bioprosthetic valve 06/15/2013    25 mm [facility] Mitral bovine pericardial tissue valve  . Fracture, fibula, shaft 01/30/2015    right     Current Outpatient Prescriptions  Medication Sig Dispense Refill  . amiodarone (PACERONE) 200 MG tablet Take 1 tablet (200 mg total) by mouth daily. 90 tablet 3  . calcium-vitamin D (OSCAL WITH D) 500-200 MG-UNIT tablet Take 1 tablet by mouth daily with breakfast.    . docusate sodium (COLACE) 100 MG capsule Take 1 capsule (100 mg total) by mouth 2 (two) times daily. 10 capsule   . HYDROcodone-acetaminophen (NORCO/VICODIN) 5-325 MG tablet Take 0.5-1 tablets by mouth every 4 (four) hours as needed for moderate pain. 20 tablet 0  . insulin detemir (LEVEMIR) 100 UNIT/ML injection Inject 0.05 mLs (5 Units total) into the skin at bedtime. 10 mL 11  . Insulin Lispro (HUMALOG KWIKPEN) 200 UNIT/ML SOPN Inject 0-9 Units into the skin 2 (two) times daily.    . isosorbide mononitrate (IMDUR) 30 MG 24 hr tablet Take 0.5 tablets (15 mg total) by mouth daily. 45 tablet 6  . levothyroxine (SYNTHROID, LEVOTHROID) 50 MCG tablet Take 50 mcg by mouth daily before breakfast.    . metolazone (ZAROXOLYN) 2.5 MG tablet Take 1 tablet by mouth  daily as needed for more  than 3 pound weight gain in 1 day or 5 pounds in a week 30 tablet 6  . metolazone (ZAROXOLYN) 5 MG tablet Take 5 mg by mouth as needed (for weight 132 or greater).     . milrinone (PRIMACOR) 20 MG/100ML SOLN infusion Inject 14.575 mcg/min into the vein continuous. 100 mL 0  . acetaminophen (TYLENOL) 500 MG tablet Take 500 mg by mouth every 6 (six) hours as needed (pain).     Marland Kitchen apixaban (ELIQUIS) 2.5 MG TABS tablet Take 1 tablet (2.5 mg total) by mouth 2 (two) times daily. 180 tablet 3  . ferrous  sulfate 325 (65 FE) MG tablet Take 1 tablet (325 mg total) by mouth 2 (two) times daily with a meal. 180 tablet 1  . furosemide (LASIX) 80 MG tablet Take 1 tablet (80 mg total) by mouth daily. 30 tablet 6  . hydrALAZINE (APRESOLINE) 10 MG tablet Take 10 mg by mouth 2 (two) times daily.    . isosorbide mononitrate (IMDUR) 30 MG 24 hr tablet Take 0.5 tablets (15 mg total) by mouth daily. 45 tablet 6   No current facility-administered medications for this encounter.   Social History   Social History  . Marital Status: Divorced    Spouse Name: N/A  . Number of Children: N/A  . Years of Education: N/A   Social History Main Topics  . Smoking status: Never Smoker   .  Smokeless tobacco: Never Used  . Alcohol Use: No  . Drug Use: No  . Sexual Activity: No   Other Topics Concern  . Not on file   Social History Narrative   Patient lives alone here in town   She continues to work, helping to bind and oversew books   She is divorced after 23 years of marriage   1 son- '61, minister; 1 dtr '55; 4 grandchildren   End of life care-provided packet on living well   Family History  Problem Relation Age of Onset  . Colon cancer Mother   . Diabetes Neg Hx   . Coronary artery disease Neg Hx     Filed Vitals:   10/31/15 1017  BP: 108/60  Pulse: 94  Weight: 148 lb 8 oz (67.359 kg)  SpO2: 94%     Wt Readings from Last 3 Encounters:  10/31/15 148 lb 8 oz (67.359 kg)  10/24/15 143 lb 8 oz (65.091 kg)  09/27/15 134 lb 1.6 oz (60.827 kg)    PHYSICAL EXAM: General: Elderly. NAD; Daughter and grandson present. Ambulated into the clinic using rollator without difficulty HEENT: 1.5 cm nodule between eyes, greatly improved after drainage. Neck: supple. JVP to ear with prominent cv waves. Carotids 2+ bilaterally; no bruits. No thyromegaly or lymphadenopathy appreciated. Cor: PMI normal. RRR. 1/6 SEM. +S3.    Lungs: Basilar crackles Abdomen: soft, NT, mildly distended, no HSM. No bruits or  masses. +BS  Extremities: no cyanosis, clubbing, rash. 2+ edema into thighs. RUE PICC. Scattered ecchymosis on bilateral LEs. Neuro: alert & orientedx3, cranial nerves grossly intact. Moves all 4 extremities w/o difficulty. Affect pleasant  ASSESSMENT & PLAN: 1. Acute on chronic systolic CHF: EF 16-10% (4/16).  NICM.  On chronic milrinone 0.25 mcg. Continue weekly lab work. BMET today shows creatinine trending up 1.6 -> 1.8 -> 2.0 - NYHA IIIb on milrinone. PICC site OK.  - Volume status remains markedly elevated.  Creatinine 2.0 this am. (trending up as above, although near her previous perceived baseline) - We spoke with nursing director, Seward Meth, and they agreed to give pt IV lasix 80 mg x 3 days with 2.5 mg of metolazone and 20 meq Potassium. After that, instructions are to resume lasix at 80 mg qam /40 mg qpm daily. - [facility] has been instructed to call with update on 11/02/15, if no improvement pt will need to be admitted to hospital.  - She does not tolerate TED hose.  - Continue hydralazine 10 mg BID and imdur 15 mg daily. No ARB/ACE with AKI.  - Encouraged family to hold [facility] accountable on Low salt diet and daily weights, also wrote orders for both on SNF sheet.     2. Carotid stenosis:  - Dopplers 12/2014 40-59% RICA stenosis stable. Repeat 3/17. 3. Hyperlipidemia:  - Statin d/c'd in June as non-essential med with new DNR/DNI status. - Last lipid check 12/2014. Stable. 4.  Aortic stenosis/mitral regurgitation:  - s/p bioprosthetic AVR/MVR stable last echo 01/2015.  5. CKD stage 4 - Cr trending up along with volume status. - Will make sure [facility] gets labs done 11/05/15. Will also see pt back in clinic next week.  6. PAF:   - EKG today shows a sensed v paced rhythm 83 bpm. Remains in NSR.  - Continue apixaban 2.5 mg BID and amio 200 mg daily.  - Denies bleeding on Eliquis. 7. St Jude CRT-D  - Sees EP 8. DM, Insulin dependent - Per PCP.  9. Cyst,  forehead - Drained at  bedside by Dr. Gala Romney without complication.   As above, [facility] to give 80 mg IV lasix with 2.5 mg metolazone and 20 meq of potassium for next three days. She will follow up next week.  Should get labs early next week per [facility].  Of note, when pt arrived back to [facility], her nurse called to let us know she had been taken out to eat by her family and returned with a large Emmit Alexanders from [place] (1480 mg of sodium) despite having just talked to [place] about wanting her to maintain a low salt diet. Will further discuss low salt diet at next visit.  Graciella Freer, PA-C 10/31/2015   Patient seen and examined with Otilio Saber, PA-C. We discussed all aspects of the encounter. I agree with the assessment and plan as stated above.   She is markedly volume overloaded and will need IV lasix.  We discussed possible admission but we contacted nursing direct at [facility] and we were able to arrange IV lasix and metolazone at SNF for next 3 days. Will see how she does. If getting worse or not improving will admit. Continue home milrinone. Supp K+. Stable from hip fx standpoint.   Total time spent 45 minutes. Over half that time spent discussing above.   Luciann Gossett,MD 11:29 PM

## 2015-11-02 ENCOUNTER — Telehealth (HOSPITAL_COMMUNITY): Payer: Self-pay

## 2015-11-02 NOTE — Telephone Encounter (Signed)
NP at [facility] states Patient has had 2 of 3 doses of IV lasix and has lost 6 lbs (141 lbs today) Feeling better, will get 3rd dose today and f/u in CHF clinic next Wednesday.  Ave Filter

## 2015-11-06 ENCOUNTER — Encounter (HOSPITAL_COMMUNITY): Payer: Medicare Other | Admitting: Internal Medicine

## 2015-11-07 ENCOUNTER — Ambulatory Visit: Payer: Self-pay | Admitting: *Deleted

## 2015-11-07 ENCOUNTER — Ambulatory Visit (HOSPITAL_COMMUNITY)
Admission: RE | Admit: 2015-11-07 | Discharge: 2015-11-07 | Disposition: A | Discharge status: Home / Self Care | Payer: Medicare Other | Source: Ambulatory Visit | Attending: Cardiology | Admitting: Cardiology

## 2015-11-07 ENCOUNTER — Other Ambulatory Visit: Payer: Self-pay | Admitting: *Deleted

## 2015-11-07 ENCOUNTER — Ambulatory Visit: Payer: Medicare Other | Admitting: *Deleted

## 2015-11-07 VITALS — BP 120/68 | HR 88 | Wt 133.4 lb

## 2015-11-07 DIAGNOSIS — I5023 Acute on chronic systolic (congestive) heart failure: Secondary | ICD-10-CM | POA: Insufficient documentation

## 2015-11-07 LAB — BASIC METABOLIC PANEL
ANION GAP: 16 — AB (ref 5–15)
BUN: 79 mg/dL — ABNORMAL HIGH (ref 6–20)
CO2: 34 mmol/L — ABNORMAL HIGH (ref 22–32)
Calcium: 9.6 mg/dL (ref 8.9–10.3)
Chloride: 90 mmol/L — ABNORMAL LOW (ref 101–111)
Creatinine, Ser: 2.43 mg/dL — ABNORMAL HIGH (ref 0.44–1.00)
GFR calc Af Amer: 20 mL/min — ABNORMAL LOW (ref >60)
GFR, EST NON AFRICAN AMERICAN: 17 mL/min — AB (ref >60)
GLUCOSE: 322 mg/dL — AB (ref 65–99)
POTASSIUM: 3.4 mmol/L — AB (ref 3.5–5.1)
Sodium: 140 mmol/L (ref 135–145)

## 2015-11-07 MED ORDER — POTASSIUM CHLORIDE ER 10 MEQ PO TBCR: 20.0000 meq | EXTENDED_RELEASE_TABLET | Freq: Every day | ORAL | Status: DC

## 2015-11-07 MED ORDER — FUROSEMIDE 80 MG PO TABS: 80.0000 mg | ORAL_TABLET | Freq: Every day | ORAL | Status: DC

## 2015-11-07 MED ORDER — METOLAZONE 2.5 MG PO TABS: ORAL_TABLET | ORAL | Status: DC

## 2015-11-07 NOTE — Patient Instructions (Signed)
CHANGE Lasix to 80 mg, one tab daily CHANGE Metolazone 2.5 mg, one tab every other day starting 11/07/15 START Potassium 20 meq daily, with additional 20 meq on Metolazone days   Labs today  Your physician recommends that you schedule a follow-up appointment in: 1 week in the [facility]  Do the following things EVERYDAY: 1) Weigh yourself in the morning before breakfast. Write it down and keep it in a log. 2) Take your medicines as prescribed 3) Eat low salt foods-Limit salt (sodium) to 2000 mg per day.  4) Stay as active as you can everyday 5) Limit all fluids for the day to less than 2 liters 6)

## 2015-11-07 NOTE — Patient Outreach (Signed)
[facility]) Care Management  11/07/2015  Joanna [name] 1930-02-03 [id number]  CSW was scheduled to meet with patient today at [facility] Facility where patient currently resides to receive short-term rehabilitative services, to perform a routine visit.  However, patient was not present during the time of CSW's visit, as patient also had an appointment scheduled with Dr. [name] with the [facility]. CSW made an attempt to try and contact patient's daughter, Joanna [name] today to follow-up regarding discharge planning needs and services for patient; however, she too was unavailable.  A HIPAA compliant message was left on voicemail for Joanna [name].  CSW is currently awaiting a return call.  CSW will reschedule the facility visit with patient.  Danford Bad, BSW, MSW, LCSW  Licensed Restaurant manager, fast food [facility]  Mailing [place] [address], [place], [place] [address] Physical Address-[address], [place], [place] [address] Toll Free Main # [phone] Fax # [phone] Cell # [phone]  Fax # [phone]  [email or web address]   [facility] [employer] complies with [employer] civil rights laws and does not discriminate on the basis of race, color, national origin, age, disability, or sex.  Espaol (Spanish)  [facility] Customer service manager cumple con las leyes federales de derechos civiles aplicables y no discrimina por motivos de raza, color, nacionalidad, edad, discapacidad o sexo.     [place]  [facility] Art gallery manager th? lu?t dn quy?n hi?n hnh c?a Lin bang v khng phn bi?t ?i x? d?a trn ch?ng t?c, mu da, ngu?n g?c qu?c gia, ? tu?i, khuy?t t?t, ho?c gi?i tnh.     (Arabic)

## 2015-11-09 ENCOUNTER — Telehealth: Payer: Self-pay | Admitting: Internal Medicine

## 2015-11-09 ENCOUNTER — Telehealth (HOSPITAL_COMMUNITY): Payer: Self-pay | Admitting: *Deleted

## 2015-11-09 NOTE — Telephone Encounter (Signed)
Pt's daughter called and pt was discharged from [facility] and they put her on levothyroxine. She's not sure what to do from here about taking this medication.  She states [facility] is coming in to see her and they told her they can do lab work for [place] if we need to. She is also on Milrinone through an IV drip.  Can you please call daughter at [phone]

## 2015-11-09 NOTE — Telephone Encounter (Signed)
Advisement on this

## 2015-11-09 NOTE — Telephone Encounter (Signed)
She should continue the levothyroxine

## 2015-11-09 NOTE — Telephone Encounter (Signed)
Pt was in to see DBensimhon and he told her take Metolazone QOD for 3 days. Her weight was down to 126 lbs and her legs had really shrunk. Questioning whether to take the next two doses or not.   Ask DB about medication and he said to hold the med and only take as needed and to continue to cook with low salt.   Spoke with Ms Rosilyn Mings about this and she understood instructions.

## 2015-11-12 NOTE — Telephone Encounter (Signed)
Spoke with daughter informed to continue medication. They are going to have a tube drawn to have TSH level evaluated to see if dosage needs adjusting. Then informed we could check in 6 months

## 2015-11-13 ENCOUNTER — Encounter: Payer: Self-pay | Admitting: *Deleted

## 2015-11-13 ENCOUNTER — Other Ambulatory Visit: Payer: Self-pay | Admitting: *Deleted

## 2015-11-13 NOTE — Progress Notes (Signed)
Patient ID: Joanna [name], female   DOB: 07-01-1930, 82 y.o.   MRN: [id number]   ADVANCED HF CLINIC NOTE  PCP: Dr Yetta Barre ([facility] on [place]) CVTS Surgeon: Dr. Cornelius Moras Primary Cardiologist: Dr. Gala Romney DNR/DNI   HPI: Joanna [name] is a 82 year old woman with aortic stenosis s/p AVR/mitral valve replacement (06/15/2013), systolic HF due to NICM, hypertension, diabetes mellitus type II, hyperlipidemia, and peripheral artery disease.  Underwent AVR/MVR with placement of epicardial lead on 06/15/13 with PAF post-op terminated with amiodarone.  Upgraded to [facility] Jude CRT-D 03/22/2015.    Admitted from 5/27 through 03/30/2015 with acute/chronic systolic HF and cardiogenic shock. She was diuresed with IV lasix and milrinone. Unable to wean milrinone and discharged on 0.25 mcg. While hospitalized CRT-D placed on 6/2. She was discharged to [facility].   Readmitted 6/11 with hypoglycemia. Also had sepsis with HCAP and received antibiotic course. She remained on milrinone 0.375 mcg. Discharge weight was 128 pounds.   Admitted 09/24/15 from facility with fall. Xray showed left hip superior and inferior pubic ramus fracture. She was considered stable from a HF standpoint and compliant with medications. Her weight was 130 lbs, consistent with previous home weights.   Labs from 10/23/15 showed Creatinine had trended up slightly from 1.6 -> 1.8, and in speaking with her daughter pts weight had gone up slowly since going back to [facility] after her hip fracture. Her weight on 10/11/15 was 141 lbs. Appt made for 10/24/15 and instructed to take a metolazone that morning.   She presents today for one week follow up. At last visit she remained elevated so metolazone changed to every other day.  She is now at home (d/c'd from [facility]). Doing much better at home. Weight at home 126. Daughter, Joanna [name], much better control over her salt intake and fluid now that she is back at home. Now doing ADLs with occasional SOB, stable. No SOB at  rest. No lightheadedness or dizziness.  AHC with weekly labs. Taking all medications. Ambulates with a walker. Goes home from [facility] next week, 11/05/14.   05/19/2013 ECHO EF 35% severe AS (low gradient severe AS confirmed by DSE), gradient: 32 mm Hg (S). Peak gradient: 57mm Hg  08/18/13 ECHO EF 45-50% AV working well 2/16 ECHO with EF 20-25%, moderately dilated LV with mild LVH, normal bioprosthetic mitral and aortic valves, moderate TR, PA systolic pressure 54 mmHg.  4/16 ECHO EF 20-25%, diffuse hypokinesis, mild LVH, bioprosthetic aortic valve functioning normally, bioprosthetic mitral valve with mild central MR and mean gradient 4 mmHg.   Labs 04/09/2015: K 3.4 Creatinine 2.01  04/16/2915: K 4.2 Creatinine 1.8 Hgb 10.3  06/11/2015: K 4.1 Creatinine 1.9 07/24/15: K 4.5 Creatinine 1.8 WBC 7.2 08/21/2015: K 3.9 Creatinine 1.7   10/22/15 K 2.8, Creatinine 1.82  ROS:  Negative except for as mentioned in HPI and problem list.  Past Medical History  Diagnosis Date  . Peripheral artery disease (HCC)   . Hyperlipidemia        . Arthritis   . Aortic stenosis      a. s/p AVR  . BUNDLE BRANCH BLOCK, LEFT    . CAROTID ARTERY STENOSIS 01/05/2009  . CVA 03/14/2010  . PERIPHERAL VASCULAR DISEASE 07/30/2007  . Chronic combined systolic and diastolic CHF (congestive heart failure) (HCC)     a. RHC 09/2011 RA 8, RV 58/2/11; PA 54/22 (37) PCWP 20; F CO/CI 4.57/2.67 PVR 3.7; b. L main no sig dz, LAD luminal irreg; LCx lg Ramus with luminal irreg,  small AV Lcx witout sig dz, RCA luminal irreg; severe mitral annular calcifications; AV calcified c. EF 45-50% (07/2013);  d. 11/2014 Echo: EF 20-25%, diff HK, nl AV/MV, sev dil LA, mod TR/PR, PASP .  . Diabetes mellitus   . HTN (hypertension)   . Iron deficiency anemia 09/21/2011  . History of shingles   . Mitral regurgitation     a. s/p MVR  . S/P aortic valve replacement with bioprosthetic valve 06/15/2013    21 mm [facility] Ease bovine pericardial  tissue valve  . S/P mitral valve replacement with bioprosthetic valve 06/15/2013    25 mm [facility] Mitral bovine pericardial tissue valve  . Fracture, fibula, shaft 01/30/2015    right     Current Outpatient Prescriptions  Medication Sig Dispense Refill  . acetaminophen (TYLENOL) 500 MG tablet Take 500 mg by mouth every 6 (six) hours as needed (pain).     Marland Kitchen amiodarone (PACERONE) 200 MG tablet Take 1 tablet (200 mg total) by mouth daily. 90 tablet 3  . apixaban (ELIQUIS) 2.5 MG TABS tablet Take 1 tablet (2.5 mg total) by mouth 2 (two) times daily. 180 tablet 3  . ferrous sulfate 325 (65 FE) MG tablet Take 1 tablet (325 mg total) by mouth 2 (two) times daily with a meal. 180 tablet 1  . furosemide (LASIX) 80 MG tablet Take 1 tablet (80 mg total) by mouth daily. 30 tablet 6  . hydrALAZINE (APRESOLINE) 10 MG tablet Take 10 mg by mouth 2 (two) times daily.    Marland Kitchen HYDROcodone-acetaminophen (NORCO/VICODIN) 5-325 MG tablet Take 0.5-1 tablets by mouth every 4 (four) hours as needed for moderate pain. 20 tablet 0  . Insulin Lispro (HUMALOG KWIKPEN) 200 UNIT/ML SOPN Inject 0-9 Units into the skin 2 (two) times daily.    . isosorbide mononitrate (IMDUR) 30 MG 24 hr tablet Take 0.5 tablets (15 mg total) by mouth daily. 45 tablet 6  . levothyroxine (SYNTHROID, LEVOTHROID) 50 MCG tablet Take 50 mcg by mouth daily before breakfast.    . metolazone (ZAROXOLYN) 2.5 MG tablet Every other day for 3 doses starting 11/07/2015 (Patient taking differently: 2.5 mg as needed. Every other day for 3 doses starting 11/07/2015) 30 tablet 6  . milrinone (PRIMACOR) 20 MG/100ML SOLN infusion Inject 14.575 mcg/min into the vein continuous. 100 mL 0  . potassium chloride (K-DUR) 10 MEQ tablet Take 2 tablets (20 mEq total) by mouth daily. With additional 20 meq on Metolazone days 90 tablet 3   No current facility-administered medications for this encounter.   Social History   Social History  . Marital Status: Divorced     Spouse Name: N/A  . Number of Children: N/A  . Years of Education: N/A   Social History Main Topics  . Smoking status: Never Smoker   . Smokeless tobacco: Never Used  . Alcohol Use: No  . Drug Use: No  . Sexual Activity: No   Other Topics Concern  . Not on file   Social History Narrative   Patient lives alone here in town   She continues to work, helping to bind and oversew books   She is divorced after 23 years of marriage   1 son- '61, minister; 1 dtr '55; 4 grandchildren   End of life care-provided packet on living well   Family History  Problem Relation Age of Onset  . Colon cancer Mother   . Diabetes Neg Hx   . Coronary artery disease Neg Hx  Filed Vitals:   11/14/15 1033  BP: 80/50  Pulse: 95  Weight: 125 lb 12.8 oz (57.063 kg)  SpO2: 97%     Wt Readings from Last 3 Encounters:  11/14/15 125 lb 12.8 oz (57.063 kg)  11/07/15 133 lb 6.4 oz (60.51 kg)  10/31/15 148 lb 8 oz (67.359 kg)    PHYSICAL EXAM: General: Elderly. NAD; Daughter and grandson present. Ambulated into the clinic using rollator without difficulty HEENT: Normal Neck: supple. JVP 6-7cm with mild HJR. Carotids 2+ bilaterally; no bruits. No thyromegaly or nodule noted. Cor: PMI normal. RRR. 1/6 SEM. +S3.    Lungs: CTAB, normal effort Abdomen: soft, NT, ND, no HSM. No bruits or masses. +BS  Extremities: no cyanosis, clubbing, rash. No edema. RUE PICC. Scattered ecchymosis on bilateral LEs. Neuro: alert & orientedx3, cranial nerves grossly intact. Moves all 4 extremities w/o difficulty. Affect pleasant  ASSESSMENT & PLAN: 1. Acute on chronic systolic CHF: EF 30-07% (4/16).  NICM.  On chronic milrinone 0.25 mcg. Continue weekly lab work.  - NYHA IIIb on milrinone. PICC site OK.  - Volume status much improved.  Creatinine high last time, held metolazone and changed directions. - Continue lasix 80 mg daily. Hold lasix for weight less than 125.  - Take metolazone as needed for weights of 3 lbs  over night or 5 lbs in a week.  Check BMET today.  - Continue K 20 meq daily with additional 20 meq on metolazone days. - Now tolerating TED hose - Continue hydralazine 10 mg BID and imdur 15 mg daily.  - No ARB/ACE with AKI and hypotension. 2. Carotid stenosis:  - Dopplers 12/2014 40-59% RICA stenosis stable. Repeat as needed 3. Hyperlipidemia:  - Statin d/c'd in June as non-essential med with new DNR/DNI status. - Last lipid check 12/2014. Stable. 4.  Aortic stenosis/mitral regurgitation:  - s/p bioprosthetic AVR/MVR stable last echo 01/2015.  5. CKD stage 4 - Was trending up with volume overload and aggressive diuresis. Recheck BMET today 6. PAF:   - Continue apixaban 2.5 mg BID and amio 200 mg daily.  - Denies bleeding on Eliquis. 7. St Jude CRT-D  - Sees EP 8. DM, Insulin dependent - Per PCP.   Looks good. Will get BMET and Thyroid labs today to send to PCP.  Follow up with Dr. Gala Romney 3-4 weeks.  Graciella Freer, PA-C 11/14/2015   Patient seen and examined with Otilio Saber, PA-C. We discussed all aspects of the encounter. I agree with the assessment and plan as stated above.   Volume status much improved. BP soft. I worry she is at risk for overdiuresis. Instructed to hold lasix for weight less than 125. Will continue hydralazine and imdur at low dose. Will check BMET to make sure renal function is stable. Continue milrinone.   Bensimhon, Daniel,MD 12:06 PM

## 2015-11-13 NOTE — Patient Outreach (Signed)
[facility]) Care Management  11/13/2015  DELONNA NEY 30-May-1930 [id number]   CSW was able to make contact with patient today by phone, after driving to [place], [place] where patient was residing to receive short-term rehabilitative services, only to learn that patient had been discharged back home on November 09, 2015.  Patient reported that she is living with her daughter, Virgilio Belling and that she is receiving home health services through [facility].  CSW inquired as to how CSW could be of assistance to patient at this time.  Patient denied being able to identify any social work specific needs at present.  CSW provided patient with CSW's contact information, encouraging patient to contact CSW directly if social work needs arise in the near future.  Patient voiced understanding and was agreeable to this plan. CSW will perform a case closure on patient, as all goals of treatment have been met from social work standpoint and no additional social work needs have been identified at this time. CSW will fax a correspondence letter, as well as a case closure letter, to patient's Primary Care Physician, Dr. Scarlette Calico to ensure that Dr. Ronnald Ramp is aware of CSW's involvement with patient. CSW will submit a case closure request to Lurline Del, Care Management Assistant with [facility], in the form of an [employer].    Nat Christen, BSW, MSW, LCSW  Licensed Education officer, [facility]  Mailing [place] [address] [place], [place] [address] Physical Address-[address], [place], [place] [address] Toll Free Main # [phone] Fax # [phone] Cell # [phone]  Fax # [phone]  [email or web address]   [facility] complies with [employer] civil rights laws and does not discriminate  on the basis of race, color, national origin, age, disability, or sex.  Espaol (Spanish)  [place] cumple con las leyes federales de derechos civiles aplicables y no discrimina por motivos de raza, color, nacionalidad, edad, discapacidad o sexo.     [place] ([place]  [place] tun th? lu?t dn quy?n hi?n hnh c?a Lin bang v khng phn bi?t ?i x? d?a trn ch?ng t?c, mu da, ngu?n g?c qu?c gia, ? tu?i, khuy?t t?t, ho?c gi?i tnh.     (Arabic)

## 2015-11-14 ENCOUNTER — Ambulatory Visit (HOSPITAL_COMMUNITY)
Admission: RE | Admit: 2015-11-14 | Discharge: 2015-11-14 | Disposition: A | Discharge status: Home / Self Care | Payer: Medicare Other | Source: Ambulatory Visit | Attending: Internal Medicine | Admitting: Internal Medicine

## 2015-11-14 VITALS — BP 80/50 | HR 95 | Wt 125.8 lb

## 2015-11-14 DIAGNOSIS — I5022 Chronic systolic (congestive) heart failure: Secondary | ICD-10-CM | POA: Diagnosis not present

## 2015-11-14 DIAGNOSIS — I272 Other secondary pulmonary hypertension: Secondary | ICD-10-CM | POA: Diagnosis not present

## 2015-11-14 DIAGNOSIS — I429 Cardiomyopathy, unspecified: Secondary | ICD-10-CM | POA: Insufficient documentation

## 2015-11-14 DIAGNOSIS — E119 Type 2 diabetes mellitus without complications: Secondary | ICD-10-CM | POA: Diagnosis not present

## 2015-11-14 DIAGNOSIS — Z952 Presence of prosthetic heart valve: Secondary | ICD-10-CM | POA: Diagnosis not present

## 2015-11-14 DIAGNOSIS — I5023 Acute on chronic systolic (congestive) heart failure: Secondary | ICD-10-CM | POA: Diagnosis present

## 2015-11-14 DIAGNOSIS — I480 Paroxysmal atrial fibrillation: Secondary | ICD-10-CM | POA: Diagnosis not present

## 2015-11-14 DIAGNOSIS — Z79899 Other long term (current) drug therapy: Secondary | ICD-10-CM | POA: Diagnosis not present

## 2015-11-14 DIAGNOSIS — N184 Chronic kidney disease, stage 4 (severe): Secondary | ICD-10-CM | POA: Insufficient documentation

## 2015-11-14 DIAGNOSIS — Z7901 Long term (current) use of anticoagulants: Secondary | ICD-10-CM | POA: Insufficient documentation

## 2015-11-14 DIAGNOSIS — I080 Rheumatic disorders of both mitral and aortic valves: Secondary | ICD-10-CM | POA: Diagnosis not present

## 2015-11-14 DIAGNOSIS — Z9889 Other specified postprocedural states: Secondary | ICD-10-CM | POA: Insufficient documentation

## 2015-11-14 DIAGNOSIS — I6521 Occlusion and stenosis of right carotid artery: Secondary | ICD-10-CM | POA: Diagnosis not present

## 2015-11-14 DIAGNOSIS — Z66 Do not resuscitate: Secondary | ICD-10-CM | POA: Diagnosis not present

## 2015-11-14 DIAGNOSIS — I129 Hypertensive chronic kidney disease with stage 1 through stage 4 chronic kidney disease, or unspecified chronic kidney disease: Secondary | ICD-10-CM | POA: Diagnosis not present

## 2015-11-14 DIAGNOSIS — E785 Hyperlipidemia, unspecified: Secondary | ICD-10-CM | POA: Diagnosis not present

## 2015-11-14 DIAGNOSIS — I739 Peripheral vascular disease, unspecified: Secondary | ICD-10-CM | POA: Diagnosis not present

## 2015-11-14 DIAGNOSIS — Z794 Long term (current) use of insulin: Secondary | ICD-10-CM | POA: Diagnosis not present

## 2015-11-14 LAB — BASIC METABOLIC PANEL
Anion gap: 10 (ref 5–15)
BUN: 55 mg/dL — AB (ref 6–20)
CALCIUM: 10 mg/dL (ref 8.9–10.3)
CO2: 29 mmol/L (ref 22–32)
CREATININE: 1.88 mg/dL — AB (ref 0.44–1.00)
Chloride: 99 mmol/L — ABNORMAL LOW (ref 101–111)
GFR calc Af Amer: 27 mL/min — ABNORMAL LOW (ref >60)
GFR, EST NON AFRICAN AMERICAN: 24 mL/min — AB (ref >60)
GLUCOSE: 259 mg/dL — AB (ref 65–99)
Potassium: 4.4 mmol/L (ref 3.5–5.1)
SODIUM: 138 mmol/L (ref 135–145)

## 2015-11-14 LAB — TSH: TSH: 9.308 u[IU]/mL — ABNORMAL HIGH (ref 0.350–4.500)

## 2015-11-14 LAB — T4, FREE: Free T4: 1.09 ng/dL (ref 0.61–1.12)

## 2015-11-14 MED ORDER — FUROSEMIDE 80 MG PO TABS: 80.0000 mg | ORAL_TABLET | Freq: Every day | ORAL | Status: AC

## 2015-11-14 NOTE — Progress Notes (Signed)
Medication Samples have been provided to the patient.  Drug name: Eliquis 2.5mg   Qty: 56  LOT: [id number]  Exp.Date: 10/2016  The patient has been instructed regarding the correct time, dose, and frequency of taking this medication, including desired effects and most common side effects.   Magda Bernheim M 11:13 AM 11/14/2015

## 2015-11-14 NOTE — Patient Instructions (Signed)
Labs today  Hold Lasix for weight less than 125 lbs  Your physician recommends that you schedule a follow-up appointment in: 4 weeks with Dr. Gala Romney  Do the following things EVERYDAY: 1) Weigh yourself in the morning before breakfast. Write it down and keep it in a log. 2) Take your medicines as prescribed 3) Eat low salt foods-Limit salt (sodium) to 2000 mg per day.  4) Stay as active as you can everyday 5) Limit all fluids for the day to less than 2 liters 6)

## 2015-11-16 ENCOUNTER — Other Ambulatory Visit (HOSPITAL_COMMUNITY): Payer: Self-pay | Admitting: Cardiology

## 2015-11-17 ENCOUNTER — Emergency Department (HOSPITAL_COMMUNITY): Payer: Medicare Other

## 2015-11-17 ENCOUNTER — Emergency Department (HOSPITAL_COMMUNITY)
Admission: EM | Admit: 2015-11-17 | Discharge: 2015-11-17 | Disposition: A | Discharge status: Home / Self Care | Payer: Medicare Other | Attending: Emergency Medicine | Admitting: Emergency Medicine

## 2015-11-17 ENCOUNTER — Encounter (HOSPITAL_COMMUNITY): Payer: Self-pay | Admitting: *Deleted

## 2015-11-17 DIAGNOSIS — Z8781 Personal history of (healed) traumatic fracture: Secondary | ICD-10-CM | POA: Diagnosis not present

## 2015-11-17 DIAGNOSIS — I5042 Chronic combined systolic (congestive) and diastolic (congestive) heart failure: Secondary | ICD-10-CM | POA: Insufficient documentation

## 2015-11-17 DIAGNOSIS — Z8673 Personal history of transient ischemic attack (TIA), and cerebral infarction without residual deficits: Secondary | ICD-10-CM | POA: Diagnosis not present

## 2015-11-17 DIAGNOSIS — Z794 Long term (current) use of insulin: Secondary | ICD-10-CM | POA: Insufficient documentation

## 2015-11-17 DIAGNOSIS — Z7901 Long term (current) use of anticoagulants: Secondary | ICD-10-CM | POA: Insufficient documentation

## 2015-11-17 DIAGNOSIS — Z8619 Personal history of other infectious and parasitic diseases: Secondary | ICD-10-CM | POA: Insufficient documentation

## 2015-11-17 DIAGNOSIS — T82898A Other specified complication of vascular prosthetic devices, implants and grafts, initial encounter: Secondary | ICD-10-CM | POA: Diagnosis present

## 2015-11-17 DIAGNOSIS — E119 Type 2 diabetes mellitus without complications: Secondary | ICD-10-CM | POA: Insufficient documentation

## 2015-11-17 DIAGNOSIS — D509 Iron deficiency anemia, unspecified: Secondary | ICD-10-CM | POA: Diagnosis not present

## 2015-11-17 DIAGNOSIS — Z9889 Other specified postprocedural states: Secondary | ICD-10-CM | POA: Insufficient documentation

## 2015-11-17 DIAGNOSIS — I10 Essential (primary) hypertension: Secondary | ICD-10-CM | POA: Insufficient documentation

## 2015-11-17 DIAGNOSIS — M1990 Unspecified osteoarthritis, unspecified site: Secondary | ICD-10-CM | POA: Diagnosis not present

## 2015-11-17 DIAGNOSIS — Z452 Encounter for adjustment and management of vascular access device: Secondary | ICD-10-CM

## 2015-11-17 DIAGNOSIS — Y658 Other specified misadventures during surgical and medical care: Secondary | ICD-10-CM | POA: Insufficient documentation

## 2015-11-17 DIAGNOSIS — Z79899 Other long term (current) drug therapy: Secondary | ICD-10-CM | POA: Insufficient documentation

## 2015-11-17 DIAGNOSIS — I509 Heart failure, unspecified: Secondary | ICD-10-CM

## 2015-11-17 MED ORDER — LIDOCAINE HCL 1 % IJ SOLN
INTRAMUSCULAR | Status: AC
  Filled 2015-11-17: qty 20

## 2015-11-17 NOTE — ED Provider Notes (Signed)
Assumed care at 4 PM. Patient is on chronic milrinone and came in for PICC line replacement after hers fell out. Patient got her PICC line replaced with IR. The patient is in no acute distress on my examination and PICC line is working appropriately. Her home milrinone was started.  Patient stable for discharge home.  I have reviewed all results with the patient.  All questions answered. Advised to call or return to have any questions, new symptoms, change in symptoms, or symptoms that they do not understand.   Marijean Niemann, MD 11/18/15 0022  Blane Ohara, MD 11/18/15 2104

## 2015-11-17 NOTE — ED Notes (Signed)
Transport to IR

## 2015-11-17 NOTE — ED Provider Notes (Signed)
CSN: [id number]     Arrival date & time 11/17/15  1254 History   First MD Initiated Contact with Patient 11/17/15 1351     Chief Complaint  Patient presents with  . Vascular Access Problem   Joanna [name] is a 82 y.o. female who presents to the emergency department after her PICC line in her right arm accidentally came out. She reports she was maneuvering around when she felt the PICC line just "fall out." She denies any pain to her right arm. She is on milrinone through her PICC line. She's had a PICC line for over a year. She is followed by cardiologist Dr. Gala Romney. PCP is Dr. Yetta Barre at [facility]. She denies any complaints. She denies fevers, arm pain, leg swelling, chest pain, shortness of breath, or rashes.  She last ate around 7:30 am. She is on Eliquis.   (Consider location/radiation/quality/duration/timing/severity/associated sxs/prior Treatment) HPI  Past Medical History  Diagnosis Date  . Peripheral artery disease (HCC)   . Hyperlipidemia        . Arthritis   . Aortic stenosis      a. s/p AVR  . BUNDLE BRANCH BLOCK, LEFT    . CAROTID ARTERY STENOSIS 01/05/2009  . CVA 03/14/2010  . PERIPHERAL VASCULAR DISEASE 07/30/2007  . Chronic combined systolic and diastolic CHF (congestive heart failure) (HCC)     a. RHC 09/2011 RA 8, RV 58/2/11; PA 54/22 (37) PCWP 20; F CO/CI 4.57/2.67 PVR 3.7; b. L main no sig dz, LAD luminal irreg; LCx lg Ramus with luminal irreg, small AV Lcx witout sig dz, RCA luminal irreg; severe mitral annular calcifications; AV calcified c. EF 45-50% (07/2013);  d. 11/2014 Echo: EF 20-25%, diff HK, nl AV/MV, sev dil LA, mod TR/PR, PASP .  . Diabetes mellitus   . HTN (hypertension)   . Iron deficiency anemia 09/21/2011  . History of shingles   . Mitral regurgitation     a. s/p MVR  . S/P aortic valve replacement with bioprosthetic valve 06/15/2013    21 mm [facility] Ease bovine pericardial tissue valve  . S/P mitral valve replacement with bioprosthetic valve  06/15/2013    25 mm [facility] Mitral bovine pericardial tissue valve  . Fracture, fibula, shaft 01/30/2015    right   Past Surgical History  Procedure Laterality Date  . Carotid endarterectomy Left 2011  . Polypectomy  12/27/04  . Cataract extraction Bilateral 2009  . Tee without cardioversion N/A 05/19/2013    Procedure: TRANSESOPHAGEAL ECHOCARDIOGRAM (TEE);  Surgeon: Laurey Morale, MD;  Location[facility] ENDOSCOPY;  Service: Cardiovascular;  Laterality: N/A;  . Colonoscopy    . Cardiac catheterization  2012  . Aortic valve replacement N/A 06/15/2013    Procedure: AORTIC VALVE REPLACEMENT (AVR);  Surgeon: Purcell Nails, MD;  Location: [facility] OR;  Service: Open Heart Surgery;  Laterality: N/A;  . Intraoperative transesophageal echocardiogram N/A 06/15/2013    Procedure: INTRAOPERATIVE TRANSESOPHAGEAL ECHOCARDIOGRAM;  Surgeon: Purcell Nails, MD;  Location: [facility] OR;  Service: Open Heart Surgery;  Laterality: N/A;  . Mitral valve replacement N/A 06/15/2013    Procedure: MITRAL VALVE (MV) REPLACEMENT;  Surgeon: Purcell Nails, MD;  Location: [facility] OR;  Service: Open Heart Surgery;  Laterality: N/A;  . Epicardial pacing lead placement N/A 06/15/2013    Procedure: EPICARDIAL PACING LEAD PLACEMENT;  Surgeon: Purcell Nails, MD;  Location: [facility] OR;  Service: Open Heart Surgery;  Laterality: N/A;  . Left and right heart catheterization with coronary angiogram N/A  09/19/2011    Procedure: LEFT AND RIGHT HEART CATHETERIZATION WITH CORONARY ANGIOGRAM;  Surgeon: Dolores Patty, MD;  Location: [facility] CATH LAB;  Service: Cardiovascular;  Laterality: N/A;  . Orif ankle fracture Right 01/31/2015    Procedure: OPEN REDUCTION INTERNAL FIXATION (ORIF) ANKLE FRACTURE;  Surgeon: Eldred Manges, MD;  Location: [facility] OR;  Service: Orthopedics;  Laterality: Right;  . Ep implantable device N/A 03/21/2015    Procedure: BiV Pacemaker Insertion CRT-P;  Surgeon: Duke Salvia, MD;  Location: [facility] INVASIVE CV LAB;  Service: Cardiovascular;   Laterality: N/A;  . Pacemaker insertion     Family History  Problem Relation Age of Onset  . Colon cancer Mother   . Diabetes Neg Hx   . Coronary artery disease Neg Hx    Social History  Substance Use Topics  . Smoking status: Never Smoker   . Smokeless tobacco: Never Used  . Alcohol Use: No   OB History    No data available     Review of Systems  Constitutional: Negative for fever and chills.  HENT: Negative for congestion and sore throat.   Eyes: Negative for visual disturbance.  Respiratory: Negative for cough and shortness of breath.   Cardiovascular: Negative for chest pain, palpitations and leg swelling.  Gastrointestinal: Negative for nausea, vomiting, abdominal pain and diarrhea.  Genitourinary: Negative for dysuria.  Musculoskeletal: Negative for back pain.  Skin: Negative for rash.  Neurological: Negative for syncope, light-headedness and headaches.      Allergies  Other and Prednisone  Home Medications   Prior to Admission medications   Medication Sig Start Date End Date Taking? Authorizing Provider  acetaminophen (TYLENOL) 500 MG tablet Take 500 mg by mouth every 6 (six) hours as needed (pain).    Yes Historical Provider, MD  amiodarone (PACERONE) 200 MG tablet Take 1 tablet (200 mg total) by mouth daily. 06/28/15  Yes Dolores Patty, MD  apixaban (ELIQUIS) 2.5 MG TABS tablet Take 1 tablet (2.5 mg total) by mouth 2 (two) times daily. 06/21/15  Yes Laurey Morale, MD  ferrous sulfate 325 (65 FE) MG tablet Take 1 tablet (325 mg total) by mouth 2 (two) times daily with a meal. 02/28/14  Yes Etta Grandchild, MD  furosemide (LASIX) 80 MG tablet Take 1 tablet (80 mg total) by mouth daily. HOLD FOR WEIGHT LESS THAN 125LBS 11/14/15  Yes Dolores Patty, MD  hydrALAZINE (APRESOLINE) 10 MG tablet Take 10 mg by mouth 2 (two) times daily.   Yes Historical Provider, MD  Insulin Lispro (HUMALOG KWIKPEN) 200 UNIT/ML SOPN Inject 0-9 Units into the skin 2 (two) times daily.    Yes Historical Provider, MD  isosorbide mononitrate (IMDUR) 30 MG 24 hr tablet Take 0.5 tablets (15 mg total) by mouth daily. 08/14/15  Yes Dolores Patty, MD  levothyroxine (SYNTHROID, LEVOTHROID) 50 MCG tablet Take 50 mcg by mouth daily before breakfast.   Yes Historical Provider, MD  milrinone (PRIMACOR) 20 MG/100ML SOLN infusion Inject 14.575 mcg/min into the vein continuous. 09/27/15  Yes Christiane Ha, MD  potassium chloride (K-DUR) 10 MEQ tablet Take 2 tablets (20 mEq total) by mouth daily. With additional 20 meq on Metolazone days 11/07/15  Yes Dolores Patty, MD  HYDROcodone-acetaminophen (NORCO/VICODIN) 5-325 MG tablet Take 0.5-1 tablets by mouth every 4 (four) hours as needed for moderate pain. 09/27/15   Christiane Ha, MD  metolazone (ZAROXOLYN) 2.5 MG tablet Every other day for 3 doses starting 11/07/2015 Patient taking  differently: 2.5 mg as needed. Every other day for 3 doses starting 11/07/2015 11/07/15   Bevelyn Buckles Bensimhon, MD   BP 125/85 mmHg  Pulse 82  Temp(Src) 98.1 F (36.7 C) (Oral)  Resp 16  SpO2 97% Physical Exam  Constitutional: She appears well-developed and well-nourished. No distress.  HENT:  Head: Normocephalic and atraumatic.  Mouth/Throat: Oropharynx is clear and moist.  Eyes: Conjunctivae are normal. Pupils are equal, round, and reactive to light. Right eye exhibits no discharge. Left eye exhibits no discharge.  Neck: Neck supple.  Cardiovascular: Normal rate, regular rhythm, normal heart sounds and intact distal pulses.  Exam reveals no gallop and no friction rub.   No murmur heard. Bilateral radial pulses are intact.  Pulmonary/Chest: Effort normal and breath sounds normal. No respiratory distress. She has no wheezes. She has no rales.  Lungs clear to auscultation bilaterally.  Abdominal: Soft. There is no tenderness.  Musculoskeletal: She exhibits no edema.  Lymphadenopathy:    She has no cervical adenopathy.  Neurological: She is  alert. Coordination normal.  Skin: Skin is warm and dry. No rash noted. She is not diaphoretic. No erythema. No pallor.  Right upper arm PICC line site is nontender to palpation. No overlying skin changes. No bleeding.  Psychiatric: She has a normal mood and affect. Her behavior is normal.  Nursing note and vitals reviewed.   ED Course  Procedures (including critical care time) Labs Review Labs Reviewed - No data to display  Imaging Review No results found.    EKG Interpretation None      Filed Vitals:   11/17/15 1347 11/17/15 1400 11/17/15 1430 11/17/15 1500  BP: 120/74 114/91 117/73 125/85  Pulse: 88 88 81 82  Temp:      TempSrc:      Resp: 16     SpO2: 96% 97% 96% 97%     MDM   Meds given in ED:  Medications  lidocaine (XYLOCAINE) 1 % (with pres) injection (not administered)    New Prescriptions   No medications on file    Final diagnoses:  CHF (congestive heart failure) (HCC)  Needs peripherally inserted central catheter (PICC)   This is a 82 y.o. female who presents to the emergency department after her PICC line in her right arm accidentally came out. She reports she was maneuvering around when she felt the PICC line just "fall out." She denies any pain to her right arm. She is on milrinone through her PICC line. She's had a PICC line for over a year. She is followed by cardiologist Dr. Gala Romney.  On exam patient is afebrile nontoxic appearing. Her right arm is nontender to palpation. No bleeding. Good radial pulses bilaterally. Patient has no complaints. I consulted with cardiologist Dr. Gala Romney who would like [place] to contact IR to have her PICC replaced today.  I consulted with interventional radiologist Dr. Miles Costain who reports he will take the patient to IR suite to replace her PICC line. At shift change patient is in IR suite. I spoke with DR. Marijean Niemann who will disposition the patient if she comes back to the ED.   This patient was discussed with Dr.  Deretha Emory who agrees with assessment and plan.    Everlene Farrier, PA-C 11/17/15 1548  Vanetta Mulders, MD 11/17/15 [phone]

## 2015-11-17 NOTE — ED Notes (Signed)
Called IT for sig. pad.

## 2015-11-17 NOTE — ED Notes (Signed)
Patient transported to IR 

## 2015-11-17 NOTE — ED Notes (Addendum)
Pt reports having picc line right arm for milrinone drip, it was accidentally pulled put pta. No other complaints and no distress noted.

## 2015-11-17 NOTE — Procedures (Signed)
Successful RUE SL POWER PICC INSERTION NO COMP STABLE TIP SVC/RA READY FOR USE

## 2015-12-04 ENCOUNTER — Other Ambulatory Visit (HOSPITAL_COMMUNITY): Payer: Self-pay | Admitting: Cardiology

## 2015-12-04 ENCOUNTER — Telehealth: Payer: Self-pay | Admitting: *Deleted

## 2015-12-04 MED ORDER — LEVOTHYROXINE SODIUM 50 MCG PO TABS: 50.0000 ug | ORAL_TABLET | Freq: Every day | ORAL | Status: DC

## 2015-12-04 NOTE — Telephone Encounter (Signed)
Left msg on triage stating mom is needing a refill on her Levothyroxine. She is almost out and would like rx sent to local & mail order "[facility]". Notified pt daughter refills has been sent...Raechel Chute

## 2015-12-07 ENCOUNTER — Telehealth (HOSPITAL_COMMUNITY): Payer: Self-pay | Admitting: Cardiology

## 2015-12-07 DIAGNOSIS — I5023 Acute on chronic systolic (congestive) heart failure: Secondary | ICD-10-CM

## 2015-12-07 MED ORDER — POTASSIUM CHLORIDE ER 10 MEQ PO TBCR
20.0000 meq | EXTENDED_RELEASE_TABLET | Freq: Two times a day (BID) | ORAL | Status: DC
Start: 2015-12-07 — End: 2015-12-13

## 2015-12-07 NOTE — Telephone Encounter (Signed)
Abnormal labs received from [facility] on 12/05/15 K 3.4 Per Tonye Becket, NP Pt should add additional 20 meq in the PM

## 2015-12-13 ENCOUNTER — Ambulatory Visit (HOSPITAL_COMMUNITY)
Admission: RE | Admit: 2015-12-13 | Discharge: 2015-12-13 | Disposition: A | Discharge status: Home / Self Care | Payer: Medicare Other | Source: Ambulatory Visit | Attending: Internal Medicine | Admitting: Internal Medicine

## 2015-12-13 ENCOUNTER — Encounter (HOSPITAL_COMMUNITY): Payer: Self-pay | Admitting: Internal Medicine

## 2015-12-13 VITALS — BP 116/66 | HR 65 | Wt 125.5 lb

## 2015-12-13 DIAGNOSIS — E785 Hyperlipidemia, unspecified: Secondary | ICD-10-CM | POA: Diagnosis not present

## 2015-12-13 DIAGNOSIS — N184 Chronic kidney disease, stage 4 (severe): Secondary | ICD-10-CM | POA: Insufficient documentation

## 2015-12-13 DIAGNOSIS — I739 Peripheral vascular disease, unspecified: Secondary | ICD-10-CM | POA: Diagnosis not present

## 2015-12-13 DIAGNOSIS — I5023 Acute on chronic systolic (congestive) heart failure: Secondary | ICD-10-CM | POA: Insufficient documentation

## 2015-12-13 DIAGNOSIS — Z953 Presence of xenogenic heart valve: Secondary | ICD-10-CM | POA: Insufficient documentation

## 2015-12-13 DIAGNOSIS — E1122 Type 2 diabetes mellitus with diabetic chronic kidney disease: Secondary | ICD-10-CM | POA: Insufficient documentation

## 2015-12-13 DIAGNOSIS — I5022 Chronic systolic (congestive) heart failure: Secondary | ICD-10-CM

## 2015-12-13 DIAGNOSIS — Z794 Long term (current) use of insulin: Secondary | ICD-10-CM | POA: Insufficient documentation

## 2015-12-13 DIAGNOSIS — Z7902 Long term (current) use of antithrombotics/antiplatelets: Secondary | ICD-10-CM | POA: Insufficient documentation

## 2015-12-13 DIAGNOSIS — I428 Other cardiomyopathies: Secondary | ICD-10-CM | POA: Insufficient documentation

## 2015-12-13 DIAGNOSIS — I130 Hypertensive heart and chronic kidney disease with heart failure and stage 1 through stage 4 chronic kidney disease, or unspecified chronic kidney disease: Secondary | ICD-10-CM | POA: Insufficient documentation

## 2015-12-13 DIAGNOSIS — Z95810 Presence of automatic (implantable) cardiac defibrillator: Secondary | ICD-10-CM | POA: Insufficient documentation

## 2015-12-13 DIAGNOSIS — I6521 Occlusion and stenosis of right carotid artery: Secondary | ICD-10-CM | POA: Insufficient documentation

## 2015-12-13 DIAGNOSIS — Z79899 Other long term (current) drug therapy: Secondary | ICD-10-CM | POA: Insufficient documentation

## 2015-12-13 DIAGNOSIS — Z8673 Personal history of transient ischemic attack (TIA), and cerebral infarction without residual deficits: Secondary | ICD-10-CM | POA: Insufficient documentation

## 2015-12-13 DIAGNOSIS — I480 Paroxysmal atrial fibrillation: Secondary | ICD-10-CM | POA: Diagnosis not present

## 2015-12-13 NOTE — Progress Notes (Signed)
Patient ID: Joanna [name], female   DOB: Jun 19, 1930, 82 y.o.   MRN: [id number]   ADVANCED HF CLINIC NOTE  PCP: Dr Yetta Barre ([facility] [place]) CVTS Surgeon: Dr. Cornelius Moras Primary Cardiologist: Dr. Gala Romney DNR/DNI   HPI: [name] is a 82 year old woman with aortic stenosis s/p AVR/mitral valve replacement (06/15/2013), systolic HF due to NICM, hypertension, diabetes mellitus type II, hyperlipidemia, and peripheral artery disease.  Underwent AVR/MVR with placement of epicardial lead on 06/15/13 with PAF post-op terminated with amiodarone.  Upgraded to [facility] Jude CRT-D 03/22/2015.    Admitted from 5/27 through 03/30/2015 with acute/chronic systolic HF and cardiogenic shock. She was diuresed with IV lasix and milrinone. Unable to wean milrinone and discharged on 0.25 mcg. While hospitalized CRT-D placed on 6/2. She was discharged to [facility].   Readmitted 6/11 with hypoglycemia. Also had sepsis with HCAP and received antibiotic course. She remained on milrinone 0.375 mcg. Discharge weight was 128 pounds.   Admitted 09/24/15 from facility with fall. Xray showed left hip superior and inferior pubic ramus fracture. She was considered stable from a HF standpoint and compliant with medications. Her weight was 130 lbs, consistent with previous home weights.   Labs from 10/23/15 showed Creatinine had trended up slightly from 1.6 -> 1.8, and in speaking with her daughter pts weight had gone up slowly since going back to [facility] after her hip fracture. Her weight on 10/11/15 was 141 lbs. Appt made for 10/24/15 and instructed to take a metolazone that morning.   She presents today for follow up. Overall doing well. Gets tired easily though. Remains on milrinone.  Weight at home fluctuating. Went up to 133 over the weekend after dietary indiscretion. Took one metolazone. Weight back down to baseline today 125-126. Daughter, Joanna [name], much better control over her salt intake and fluid now that she is back at home. Now doing ADLs  with occasional SOB, stable. Walks with cane or walker. No SOB at rest. No lightheadedness or dizziness. P [facility] with weekly labs.   05/19/2013 ECHO EF 35% severe AS (low gradient severe AS confirmed by DSE), gradient: 32 mm Hg (S). Peak gradient: 57mm Hg  08/18/13 ECHO EF 45-50% AV working well 2/16 ECHO with EF 20-25%, moderately dilated LV with mild LVH, normal bioprosthetic mitral and aortic valves, moderate TR, PA systolic pressure 54 mmHg.  4/16 ECHO EF 20-25%, diffuse hypokinesis, mild LVH, bioprosthetic aortic valve functioning normally, bioprosthetic mitral valve with mild central MR and mean gradient 4 mmHg.   Labs 04/09/2015: K 3.4 Creatinine 2.01  04/16/2915: K 4.2 Creatinine 1.8 Hgb 10.3  06/11/2015: K 4.1 Creatinine 1.9 07/24/15: K 4.5 Creatinine 1.8 WBC 7.2 08/21/2015: K 3.9 Creatinine 1.7   10/22/15 K 2.8, Creatinine 1.82  ROS:  Negative except for as mentioned in HPI and problem list.  Past Medical History  Diagnosis Date  . Peripheral artery disease (HCC)   . Hyperlipidemia        . Arthritis   . Aortic stenosis      a. s/p AVR  . BUNDLE BRANCH BLOCK, LEFT    . CAROTID ARTERY STENOSIS 01/05/2009  . CVA 03/14/2010  . PERIPHERAL VASCULAR DISEASE 07/30/2007  . Chronic combined systolic and diastolic CHF (congestive heart failure) (HCC)     a. RHC 09/2011 RA 8, RV 58/2/11; PA 54/22 (37) PCWP 20; F CO/CI 4.57/2.67 PVR 3.7; b. L main no sig dz, LAD luminal irreg; LCx lg Ramus with luminal irreg, small AV Lcx witout sig dz, RCA  luminal irreg; severe mitral annular calcifications; AV calcified c. EF 45-50% (07/2013);  d. 11/2014 Echo: EF 20-25%, diff HK, nl AV/MV, sev dil LA, mod TR/PR, PASP .  . Diabetes mellitus   . HTN (hypertension)   . Iron deficiency anemia 09/21/2011  . History of shingles   . Mitral regurgitation     a. s/p MVR  . S/P aortic valve replacement with bioprosthetic valve 06/15/2013    21 mm [facility] Ease bovine pericardial tissue valve  . S/P mitral  valve replacement with bioprosthetic valve 06/15/2013    25 mm [facility] Mitral bovine pericardial tissue valve  . Fracture, fibula, shaft 01/30/2015    right     Current Outpatient Prescriptions  Medication Sig Dispense Refill  . acetaminophen (TYLENOL) 500 MG tablet Take 500 mg by mouth every 6 (six) hours as needed (pain).     Marland Kitchen amiodarone (PACERONE) 200 MG tablet Take 1 tablet (200 mg total) by mouth daily. 90 tablet 3  . apixaban (ELIQUIS) 2.5 MG TABS tablet Take 1 tablet (2.5 mg total) by mouth 2 (two) times daily. 180 tablet 3  . ferrous sulfate 325 (65 FE) MG tablet Take 1 tablet (325 mg total) by mouth 2 (two) times daily with a meal. 180 tablet 1  . furosemide (LASIX) 80 MG tablet Take 1 tablet (80 mg total) by mouth daily. HOLD FOR WEIGHT LESS THAN 125LBS 30 tablet 6  . hydrALAZINE (APRESOLINE) 10 MG tablet Take 10 mg by mouth 2 (two) times daily.    . Insulin Lispro (HUMALOG KWIKPEN) 200 UNIT/ML SOPN Inject 0-9 Units into the skin 2 (two) times daily.    . isosorbide mononitrate (IMDUR) 30 MG 24 hr tablet Take 0.5 tablets (15 mg total) by mouth daily. 45 tablet 6  . levothyroxine (SYNTHROID, LEVOTHROID) 50 MCG tablet Take 1 tablet (50 mcg total) by mouth daily before breakfast. 90 tablet 1  . milrinone (PRIMACOR) 20 MG/100ML SOLN infusion Inject 14.575 mcg/min into the vein continuous. 100 mL 0  . potassium chloride SA (K-DUR,KLOR-CON) 20 MEQ tablet Take 20-40 mEq by mouth as directed. Take ( 1 tablet) in the AM and ( 2 tablets) in the PM    . metolazone (ZAROXOLYN) 2.5 MG tablet Take 2.5 mg by mouth as needed. Reported on 12/13/2015     No current facility-administered medications for this encounter.   Social History   Social History  . Marital Status: Divorced    Spouse Name: N/A  . Number of Children: N/A  . Years of Education: N/A   Social History Main Topics  . Smoking status: Never Smoker   . Smokeless tobacco: Never Used  . Alcohol Use: No  . Drug  Use: No  . Sexual Activity: No   Other Topics Concern  . None   Social History Narrative   Patient lives alone here in town   She continues to work, helping to bind and oversew books   She is divorced after 23 years of marriage   1 son- '61, minister; 1 dtr '55; 4 grandchildren   End of life care-provided packet on living well   Family History  Problem Relation Age of Onset  . Colon cancer Mother   . Diabetes Neg Hx   . Coronary artery disease Neg Hx     Filed Vitals:   12/13/15 0951  BP: 116/66  Pulse: 65  Weight: 125 lb 8 oz (56.926 kg)  SpO2: 95%     Wt Readings  from Last 3 Encounters:  12/13/15 125 lb 8 oz (56.926 kg)  11/14/15 125 lb 12.8 oz (57.063 kg)  11/07/15 133 lb 6.4 oz (60.51 kg)    PHYSICAL EXAM: General: Elderly. NAD; Daughter present. Ambulated into the clinic using cane without difficulty HEENT: Normal Neck: supple. JVP 6-7cm with mild HJR. Carotids 2+ bilaterally; no bruits. No thyromegaly or nodule noted. Cor: PMI normal. RRR. 1/6 SEM. +S3.    Lungs: CTAB, normal effort Abdomen: soft, NT, ND, no HSM. No bruits or masses. +BS  Extremities: no cyanosis, clubbing, rash. No edema. RUE PICC. Scattered ecchymosis on bilateral LEs. Neuro: alert & orientedx3, cranial nerves grossly intact. Moves all 4 extremities w/o difficulty. Affect pleasant  ASSESSMENT & PLAN: 1. Acute on chronic systolic CHF: EF 16-10% (4/16).  NICM.  On chronic milrinone 0.25 mcg. Continue weekly lab work.  - NYHA IIIb on milrinone. PICC site OK.  - Volume status much improved.  Doing very well with sliding scale diuretics.  - Continue lasix 80 mg daily. Hold lasix for weight less than 125.  - Take metolazone as needed for weights of 3 lbs over night or 5 lbs in a week.  Check BMET next week with [facility]  - Continue K 20 meq daily and 40 at night with additional 20 meq on metolazone days. - Continue hydralazine 10 mg BID and imdur 15 mg daily.  - No ARB/ACE with AKI and  hypotension. 2. Carotid stenosis:  - Dopplers 12/2014 40-59% RICA stenosis stable. Repeat as needed 3. Hyperlipidemia:  - Statin d/c'd in June as non-essential med with new DNR/DNI status. - Last lipid check 12/2014. Stable. 4.  Aortic stenosis/mitral regurgitation:  - s/p bioprosthetic AVR/MVR stable last echo 01/2015.  5. CKD stage 4 - Stable. Recheck weekly.  6. PAF:   - Continue apixaban 2.5 mg BID and amio 200 mg daily.  - Denies bleeding on Eliquis. 7. St Jude CRT-D  - Sees EP 8. DM, Insulin dependent - Per PCP.   Mahamed Zalewski,MD 10:12 AM

## 2015-12-13 NOTE — Addendum Note (Signed)
Encounter addended by: Noralee Space, RN on: 12/13/2015 10:19 AM<BR>     Documentation filed: Patient Instructions Section

## 2015-12-13 NOTE — Patient Instructions (Signed)
Your physician recommends that you schedule a follow-up appointment in: 3-4 weeks  

## 2015-12-14 ENCOUNTER — Other Ambulatory Visit (HOSPITAL_COMMUNITY): Payer: Self-pay | Admitting: Cardiology

## 2015-12-18 ENCOUNTER — Telehealth (HOSPITAL_COMMUNITY): Payer: Self-pay

## 2015-12-18 NOTE — Telephone Encounter (Signed)
[facility] RN Donita called to report patient had complained of brief, intermittent "chest pain" that does not radiate, and no other s/s accompanying. VSS, weight at baseline. Called patient at home to obtain additional information, patient states she feels great and thinks this is musculoskeletal/arthritic in nature as she has had similar pains before. Denies SOB, sweats, dizziness, or any other adverse symptoms. Advised to call us back if this continues or worsens, and to seek emergency medical care for any drastic changes. Aware and agreeable to plan.  Ave Filter

## 2015-12-20 ENCOUNTER — Telehealth: Payer: Self-pay | Admitting: Internal Medicine

## 2015-12-20 NOTE — Telephone Encounter (Signed)
For MD to respond: "States patients cardiologist called them today suggesting Dr. Yetta Barre check her blood sugar." please advise  The rest of the information below is mostly informational.

## 2015-12-20 NOTE — Telephone Encounter (Signed)
States patients cardiologist called them today suggesting Dr. Yetta Barre check her blood sugar. States home health nurse came in on Tuesday and sent blood sample to cardiologist and blood sugar was 3 something. States this morning blood sugar was 225. Daughter states she has not been too worried about blood sugar being high if patient eats a lot of carbs.  States that once blood sugar is up she will give patient injection and blood sugar will go back down.  Daughter states that she is not with patient all day and patient could be sneaking something.  States 225 is average.  States that blood sugar will go up to as high as 5 something but that is in the evening after she has eating crackers.  States she has since taken crackers away from patient.  States that blood sugar has been as low as 180 something and that was in the morning before she eats.  Please follow up with daughter in regards.

## 2015-12-23 NOTE — Telephone Encounter (Signed)
I will check her blood sugar the next time I see her

## 2015-12-24 NOTE — Telephone Encounter (Signed)
Spoke with pt daughter. It is time for pt to come in for 6 mo follow up. Appt has been scheduled.

## 2015-12-28 ENCOUNTER — Other Ambulatory Visit (HOSPITAL_COMMUNITY): Payer: Self-pay | Admitting: Cardiology

## 2015-12-31 ENCOUNTER — Ambulatory Visit (INDEPENDENT_AMBULATORY_CARE_PROVIDER_SITE_OTHER): Payer: Medicare Other | Admitting: Internal Medicine

## 2015-12-31 ENCOUNTER — Other Ambulatory Visit: Payer: Self-pay | Admitting: Internal Medicine

## 2015-12-31 ENCOUNTER — Encounter: Payer: Self-pay | Admitting: Internal Medicine

## 2015-12-31 ENCOUNTER — Other Ambulatory Visit (INDEPENDENT_AMBULATORY_CARE_PROVIDER_SITE_OTHER): Payer: Medicare Other

## 2015-12-31 VITALS — BP 110/70 | HR 85 | Temp 97.8°F | Ht 64.0 in | Wt 127.0 lb

## 2015-12-31 DIAGNOSIS — I10 Essential (primary) hypertension: Secondary | ICD-10-CM

## 2015-12-31 DIAGNOSIS — N184 Chronic kidney disease, stage 4 (severe): Secondary | ICD-10-CM | POA: Diagnosis not present

## 2015-12-31 DIAGNOSIS — E0821 Diabetes mellitus due to underlying condition with diabetic nephropathy: Secondary | ICD-10-CM

## 2015-12-31 DIAGNOSIS — E785 Hyperlipidemia, unspecified: Secondary | ICD-10-CM

## 2015-12-31 DIAGNOSIS — R946 Abnormal results of thyroid function studies: Secondary | ICD-10-CM

## 2015-12-31 DIAGNOSIS — R7989 Other specified abnormal findings of blood chemistry: Secondary | ICD-10-CM

## 2015-12-31 DIAGNOSIS — Z794 Long term (current) use of insulin: Secondary | ICD-10-CM

## 2015-12-31 DIAGNOSIS — I340 Nonrheumatic mitral (valve) insufficiency: Secondary | ICD-10-CM

## 2015-12-31 DIAGNOSIS — E876 Hypokalemia: Secondary | ICD-10-CM

## 2015-12-31 LAB — CBC WITH DIFFERENTIAL/PLATELET
BASOS PCT: 0.4 % (ref 0.0–3.0)
Basophils Absolute: 0 10*3/uL (ref 0.0–0.1)
EOS ABS: 0.2 10*3/uL (ref 0.0–0.7)
Eosinophils Relative: 2.8 % (ref 0.0–5.0)
HEMATOCRIT: 41.7 % (ref 36.0–46.0)
Hemoglobin: 14.1 g/dL (ref 12.0–15.0)
LYMPHS PCT: 15.8 % (ref 12.0–46.0)
Lymphs Abs: 1.1 10*3/uL (ref 0.7–4.0)
MCHC: 33.7 g/dL (ref 30.0–36.0)
MCV: 91.3 fl (ref 78.0–100.0)
MONO ABS: 0.4 10*3/uL (ref 0.1–1.0)
Monocytes Relative: 5.1 % (ref 3.0–12.0)
NEUTROS ABS: 5.4 10*3/uL (ref 1.4–7.7)
Neutrophils Relative %: 75.9 % (ref 43.0–77.0)
PLATELETS: 192 10*3/uL (ref 150.0–400.0)
RBC: 4.57 Mil/uL (ref 3.87–5.11)
RDW: 15.5 % (ref 11.5–15.5)
WBC: 7.1 10*3/uL (ref 4.0–10.5)

## 2015-12-31 LAB — HEPATIC FUNCTION PANEL
ALBUMIN: 4.3 g/dL (ref 3.5–5.2)
ALK PHOS: 70 U/L (ref 39–117)
ALT: 22 U/L (ref 0–35)
AST: 21 U/L (ref 0–37)
BILIRUBIN DIRECT: 0.3 mg/dL (ref 0.0–0.3)
TOTAL PROTEIN: 6.9 g/dL (ref 6.0–8.3)
Total Bilirubin: 1.2 mg/dL (ref 0.2–1.2)

## 2015-12-31 LAB — MICROALBUMIN / CREATININE URINE RATIO
Creatinine,U: 46 mg/dL
MICROALB/CREAT RATIO: 5.2 mg/g (ref 0.0–30.0)
Microalb, Ur: 2.4 mg/dL — ABNORMAL HIGH (ref 0.0–1.9)

## 2015-12-31 LAB — TSH: TSH: 6.74 u[IU]/mL — AB (ref 0.35–4.50)

## 2015-12-31 LAB — LIPID PANEL
CHOLESTEROL: 268 mg/dL — AB (ref 0–200)
HDL: 92.5 mg/dL (ref >39.00)
LDL CALC: 158 mg/dL — AB (ref 0–99)
NonHDL: 175.75
TRIGLYCERIDES: 90 mg/dL (ref 0.0–149.0)
Total CHOL/HDL Ratio: 3
VLDL: 18 mg/dL (ref 0.0–40.0)

## 2015-12-31 LAB — HEMOGLOBIN A1C: HEMOGLOBIN A1C: 8.5 % — AB (ref 4.6–6.5)

## 2015-12-31 LAB — BASIC METABOLIC PANEL
BUN: 77 mg/dL — AB (ref 6–23)
CO2: 32 mEq/L (ref 19–32)
Calcium: 10 mg/dL (ref 8.4–10.5)
Chloride: 94 mEq/L — ABNORMAL LOW (ref 96–112)
Creatinine, Ser: 2.13 mg/dL — ABNORMAL HIGH (ref 0.40–1.20)
GFR: 23.49 mL/min — AB (ref >60.00)
Glucose, Bld: 203 mg/dL — ABNORMAL HIGH (ref 70–99)
POTASSIUM: 3.2 meq/L — AB (ref 3.5–5.1)
Sodium: 138 mEq/L (ref 135–145)

## 2015-12-31 LAB — T3: T3, Total: 38 ng/dL — ABNORMAL LOW (ref 76–181)

## 2015-12-31 MED ORDER — POTASSIUM CHLORIDE CRYS ER 20 MEQ PO TBCR: 20.0000 meq | EXTENDED_RELEASE_TABLET | Freq: Two times a day (BID) | ORAL | Status: DC

## 2015-12-31 MED ORDER — LEVOTHYROXINE SODIUM 75 MCG PO TABS: 75.0000 ug | ORAL_TABLET | Freq: Every day | ORAL | Status: DC

## 2015-12-31 MED ORDER — INSULIN GLARGINE 300 UNIT/ML ~~LOC~~ SOPN: 20.0000 [IU] | PEN_INJECTOR | Freq: Every day | SUBCUTANEOUS | Status: DC

## 2015-12-31 NOTE — Patient Instructions (Signed)

## 2015-12-31 NOTE — Progress Notes (Signed)
Subjective:  Patient ID: Joanna [name], female    DOB: [birth year]  Age: 82 y.o. MRN: [id number]  CC: Hypertension and Diabetes   HPI Sway H Derossett presents for a f/up on Hypertension and Diabetes. Her daughter manages her blood sugars and tells me that the blood sugar will range wildly based on the patient's intake of carbohydrates. For example a couple months ago she ate pizza and the blood sugar went up to 500. She gives her 9 units of lispro and the blood sugar gradually comes down. Over the past few weeks the highest blood sugars been around 350. She says most of the blood sugars have been in the 200-218 range. She has intermittent episodes of polydipsia and polyuria.  Outpatient Prescriptions Prior to Visit  Medication Sig Dispense Refill  . acetaminophen (TYLENOL) 500 MG tablet Take 500 mg by mouth every 6 (six) hours as needed (pain).     Marland Kitchen amiodarone (PACERONE) 200 MG tablet Take 1 tablet (200 mg total) by mouth daily. 90 tablet 3  . apixaban (ELIQUIS) 2.5 MG TABS tablet Take 1 tablet (2.5 mg total) by mouth 2 (two) times daily. 180 tablet 3  . ferrous sulfate 325 (65 FE) MG tablet Take 1 tablet (325 mg total) by mouth 2 (two) times daily with a meal. 180 tablet 1  . furosemide (LASIX) 80 MG tablet Take 1 tablet (80 mg total) by mouth daily. HOLD FOR WEIGHT LESS THAN 125LBS 30 tablet 6  . hydrALAZINE (APRESOLINE) 10 MG tablet Take 10 mg by mouth 2 (two) times daily.    . Insulin Lispro (HUMALOG KWIKPEN) 200 UNIT/ML SOPN Inject 0-9 Units into the skin 2 (two) times daily.    . isosorbide mononitrate (IMDUR) 30 MG 24 hr tablet Take 0.5 tablets (15 mg total) by mouth daily. 45 tablet 6  . metolazone (ZAROXOLYN) 2.5 MG tablet Take 2.5 mg by mouth as needed. Reported on 12/13/2015    . milrinone (PRIMACOR) 20 MG/100ML SOLN infusion Inject 14.575 mcg/min into the vein continuous. 100 mL 0  . levothyroxine (SYNTHROID, LEVOTHROID) 50 MCG tablet Take 1 tablet (50 mcg total) by mouth daily  before breakfast. 90 tablet 1  . potassium chloride SA (K-DUR,KLOR-CON) 20 MEQ tablet Take 20-40 mEq by mouth as directed. Take ( 1 tablet) in the AM and ( 2 tablets) in the PM     No facility-administered medications prior to visit.    ROS Review of Systems  Constitutional: Negative.  Negative for chills, diaphoresis, appetite change, fatigue and unexpected weight change.  HENT: Negative.   Eyes: Negative.   Respiratory: Negative.  Negative for cough, choking, chest tightness, shortness of breath and stridor.   Cardiovascular: Negative.  Negative for chest pain, palpitations and leg swelling.  Gastrointestinal: Negative.  Negative for nausea, vomiting, abdominal pain, diarrhea and constipation.  Endocrine: Positive for polydipsia and polyuria. Negative for polyphagia.  Genitourinary: Negative.  Negative for dysuria, urgency, frequency, hematuria, flank pain, enuresis and difficulty urinating.  Musculoskeletal: Negative.  Negative for myalgias, back pain and neck pain.  Skin: Negative.   Allergic/Immunologic: Negative.   Neurological: Negative.  Negative for dizziness, tremors, weakness, light-headedness, numbness and headaches.  Hematological: Negative.  Negative for adenopathy. Does not bruise/bleed easily.  Psychiatric/Behavioral: Negative.  Negative for confusion.    Objective:  BP 110/70 mmHg  Pulse 85  Temp(Src) 97.8 F (36.6 C) (Oral)  Ht 5\' 4"  (1.626 m)  Wt 127 lb (57.607 kg)  BMI 21.79 kg/m2  SpO2  97%  BP Readings from Last 3 Encounters:  12/31/15 110/70  12/13/15 116/66  11/17/15 123/72    Wt Readings from Last 3 Encounters:  12/31/15 127 lb (57.607 kg)  12/13/15 125 lb 8 oz (56.926 kg)  11/14/15 125 lb 12.8 oz (57.063 kg)    Physical Exam  Constitutional: She is oriented to person, place, and time. She appears well-developed and well-nourished. No distress.  HENT:  Head: Normocephalic and atraumatic.  Mouth/Throat: Oropharynx is clear and  moist. No oropharyngeal exudate.  Eyes: Conjunctivae are normal. Right eye exhibits no discharge. Left eye exhibits no discharge. No scleral icterus.  Neck: Normal range of motion. Neck supple. No JVD present. No tracheal deviation present. No thyromegaly present.  Cardiovascular: Normal rate, regular rhythm, normal heart sounds and intact distal pulses.  Exam reveals no gallop and no friction rub.   No murmur heard. Pulmonary/Chest: Effort normal. No stridor. No respiratory distress. She has no wheezes. She has no rales. She exhibits no tenderness.  Abdominal: Soft. Bowel sounds are normal. She exhibits no distension and no mass. There is no tenderness. There is no rebound and no guarding.  Musculoskeletal: Normal range of motion. She exhibits edema (trace pitting edema in bilateral lower extremities). She exhibits no tenderness.  Lymphadenopathy:    She has no cervical adenopathy.  Neurological: She is oriented to person, place, and time.  Skin: Skin is warm and dry. No rash noted. She is not diaphoretic. No erythema. No pallor.  Psychiatric: She has a normal mood and affect. Her behavior is normal. Judgment and thought content normal.  Vitals reviewed.   Lab Results  Component Value Date   WBC 7.1 12/31/2015   HGB 14.1 12/31/2015   HCT 41.7 12/31/2015   PLT 192.0 12/31/2015   GLUCOSE 203* 12/31/2015   CHOL 268* 12/31/2015   TRIG 90.0 12/31/2015   HDL 92.50 12/31/2015   LDLDIRECT 120.6 11/28/2013   LDLCALC 158* 12/31/2015   ALT 22 12/31/2015   AST 21 12/31/2015   NA 138 12/31/2015   K 3.2* 12/31/2015   CL 94* 12/31/2015   CREATININE 2.13* 12/31/2015   BUN 77* 12/31/2015   CO2 32 12/31/2015   TSH 6.74* 12/31/2015   INR 3.3 07/07/2014   HGBA1C 8.5* 12/31/2015   MICROALBUR 2.4* 12/31/2015    No results found.  Assessment & Plan:   Ellarie was seen today for hypertension and diabetes.  Diagnoses and all orders for this visit:  Essential hypertension- her blood pressures  well controlled, I will replete her low potassium level. -     CBC with Differential/Platelet; Future -     Basic metabolic panel; Future  CKD (chronic kidney disease) stage 4, GFR 15-29 ml/min (HCC)- her renal function is stable -     CBC with Differential/Platelet; Future -     Basic metabolic panel; Future  Hyperlipidemia with target LDL less than 100- she has not achieved her LDL goal and is also not willing to start a statin, I'm not sure would be of much benefit at the age of 42 any way. -     Lipid panel; Future -     Hepatic function panel; Future  TSH elevation- she is hypothyroid, this is most likely induced by the amiodarone therapy, her T3 is low and her TSH is elevated so I have increased her Synthroid dose from 50 g a day to 75 g a day. -     Cancel: T4, free; Future -  T3; Future -     TSH; Future -     Cancel: Thyroid peroxidase antibody; Future -     levothyroxine (SYNTHROID, LEVOTHROID) 75 MCG tablet; Take 1 tablet (75 mcg total) by mouth daily.  Diabetes mellitus due to underlying condition with diabetic nephropathy, with long-term current use of insulin (HCC)- her A1c is up to 8.5%, I spoke with her daughter about adding a long-acting/basal insulin but she is not willing to add another insulin at this time, she agrees to be more compliant with monitoring the carb intake and using the quick acting insulin. -     Lipid panel; Future -     Hemoglobin A1c; Future -     Microalbumin / creatinine urine ratio; Future  Hypokalemia- her potassium level is down to 3.2, I have recent a prescription to the pharmacy to make sure that she is is taking this twice a day. This is caused by her diuretic therapy. -     potassium chloride SA (K-DUR,KLOR-CON) 20 MEQ tablet; Take 1 tablet (20 mEq total) by mouth 2 (two) times daily. Take ( 1 tablet) in the AM and ( 2 tablets) in the PM  I have discontinued Ms. Hofmeister levothyroxine. I have also changed her potassium chloride  SA. Additionally, I am having her start on levothyroxine. Lastly, I am having her maintain her ferrous sulfate, acetaminophen, apixaban, hydrALAZINE, amiodarone, isosorbide mononitrate, milrinone, Insulin Lispro, furosemide, and metolazone.  Meds ordered this encounter  Medications  . levothyroxine (SYNTHROID, LEVOTHROID) 75 MCG tablet    Sig: Take 1 tablet (75 mcg total) by mouth daily.    Dispense:  90 tablet    Refill:  1  . potassium chloride SA (K-DUR,KLOR-CON) 20 MEQ tablet    Sig: Take 1 tablet (20 mEq total) by mouth 2 (two) times daily. Take ( 1 tablet) in the AM and ( 2 tablets) in the PM    Dispense:  180 tablet    Refill:  1     Follow-up: Return in about 6 months (around 07/02/2016).  Sanda Linger, MD

## 2015-12-31 NOTE — Progress Notes (Signed)
Pre visit review using our clinic review tool, if applicable. No additional management support is needed unless otherwise documented below in the visit note. 

## 2016-01-01 ENCOUNTER — Other Ambulatory Visit: Payer: Self-pay

## 2016-01-01 DIAGNOSIS — E0821 Diabetes mellitus due to underlying condition with diabetic nephropathy: Secondary | ICD-10-CM

## 2016-01-01 DIAGNOSIS — Z794 Long term (current) use of insulin: Principal | ICD-10-CM

## 2016-01-01 MED ORDER — INSULIN GLARGINE 300 UNIT/ML ~~LOC~~ SOPN: 20.0000 [IU] | PEN_INJECTOR | Freq: Every day | SUBCUTANEOUS | Status: DC

## 2016-01-03 ENCOUNTER — Other Ambulatory Visit: Payer: Self-pay | Admitting: Internal Medicine

## 2016-01-03 DIAGNOSIS — I6523 Occlusion and stenosis of bilateral carotid arteries: Secondary | ICD-10-CM

## 2016-01-09 ENCOUNTER — Telehealth (HOSPITAL_COMMUNITY): Payer: Self-pay | Admitting: *Deleted

## 2016-01-09 NOTE — Telephone Encounter (Signed)
pts daughter called and stated that pts weight was up yesterday she gave her a metolazone.  Her weight today is th same.  She wanted to know if she should give her another metolazone today. Pt is scheduled for an office visit with Mardelle Matte tomorrow. Per Mardelle Matte she should only take a metolazone today if there is worsening shortness of breath. Patients daughter aware and will follow up with office visit tomorrow.

## 2016-01-09 NOTE — Progress Notes (Signed)
Patient ID: Joanna [name], female   DOB: [birth year], 82 y.o.   MRN: [id number]   ADVANCED HF CLINIC NOTE  PCP: Dr Yetta Barre ([facility] [place]) CVTS Surgeon: Dr. Cornelius Moras Primary Cardiologist: Dr. Gala Romney DNR/DNI   HPI: Joanna [name] is a 82 year old woman with aortic stenosis s/p AVR/mitral valve replacement (06/15/2013), systolic HF due to NICM, hypertension, diabetes mellitus type II, hyperlipidemia, and peripheral artery disease.  Underwent AVR/MVR with placement of epicardial lead on 06/15/13 with PAF post-op terminated with amiodarone.  Upgraded to [facility] Jude CRT-D 03/22/2015.    Admitted from 5/27 through 03/30/2015 with acute/chronic systolic HF and cardiogenic shock. She was diuresed with IV lasix and milrinone. Unable to wean milrinone and discharged on 0.25 mcg. While hospitalized CRT-D placed on 6/2. She was discharged to [facility].   Readmitted 6/11 with hypoglycemia. Also had sepsis with HCAP and received antibiotic course. She remained on milrinone 0.375 mcg. Discharge weight was 128 pounds.   Admitted 09/24/15 from facility with fall. Xray showed left hip superior and inferior pubic ramus fracture. She was considered stable from a HF standpoint and compliant with medications. Her weight was 130 lbs, consistent with previous home weights.   Labs from 10/23/15 showed Creatinine had trended up slightly from 1.6 -> 1.8, and in speaking with her daughter pts weight had gone up slowly since going back to [facility] after her hip fracture. Her weight on 10/11/15 was 141 lbs. Appt made for 10/24/15 and instructed to take a metolazone that morning.   She presents today for follow up. Overall doing well. Did have to take a metolazone 01/08/16 for weight gain. Weight is up 6 lbs from last visit.  Weight at home this am 129 lbs. She has had a productive cough x 1 week. Did have a mild fever last week ~100 but non since.  Has had urinary incontinence, daughter unsure if urine smells strongly or not. Watching salt and  fluid intake.  Pt breathing is a little more labored at home, but thinks it is from cough. Has been taking robitussin. Has had intermittent confusion and weakness at home per daughter. No DOE with ADLs or orthopnea. Watching fluid and salt. No lightheadedness or dizziness.  05/19/2013 ECHO EF 35% severe AS (low gradient severe AS confirmed by DSE), gradient: 32 mm Hg (S). Peak gradient: 57mm Hg  08/18/13 ECHO EF 45-50% AV working well 2/16 ECHO with EF 20-25%, moderately dilated LV with mild LVH, normal bioprosthetic mitral and aortic valves, moderate TR, PA systolic pressure 54 mmHg.  4/16 ECHO EF 20-25%, diffuse hypokinesis, mild LVH, bioprosthetic aortic valve functioning normally, bioprosthetic mitral valve with mild central MR and mean gradient 4 mmHg.   Labs 04/09/2015: K 3.4 Creatinine 2.01  04/16/2915: K 4.2 Creatinine 1.8 Hgb 10.3  06/11/2015: K 4.1 Creatinine 1.9 07/24/15: K 4.5 Creatinine 1.8 WBC 7.2 08/21/2015: K 3.9 Creatinine 1.7   10/22/15 K 2.8, Creatinine 1.82 12/31/15 K 3.2, Creatinine 2.13  ROS:  Negative except for as mentioned in HPI and problem list.  Past Medical History  Diagnosis Date  . Peripheral artery disease (HCC)   . Hyperlipidemia        . Arthritis   . Aortic stenosis      a. s/p AVR  . BUNDLE BRANCH BLOCK, LEFT    . CAROTID ARTERY STENOSIS 01/05/2009  . CVA 03/14/2010  . PERIPHERAL VASCULAR DISEASE 07/30/2007  . Chronic combined systolic and diastolic CHF (congestive heart failure) (HCC)     a. RHC  09/2011 RA 8, RV 58/2/11; PA 54/22 (37) PCWP 20; F CO/CI 4.57/2.67 PVR 3.7; b. L main no sig dz, LAD luminal irreg; LCx lg Ramus with luminal irreg, small AV Lcx witout sig dz, RCA luminal irreg; severe mitral annular calcifications; AV calcified c. EF 45-50% (07/2013);  d. 11/2014 Echo: EF 20-25%, diff HK, nl AV/MV, sev dil LA, mod TR/PR, PASP .  . Diabetes mellitus   . HTN (hypertension)   . Iron deficiency anemia 09/21/2011  . History of shingles   . Mitral  regurgitation     a. s/p MVR  . S/P aortic valve replacement with bioprosthetic valve 06/15/2013    21 mm [facility] Ease bovine pericardial tissue valve  . S/P mitral valve replacement with bioprosthetic valve 06/15/2013    25 mm [facility] Mitral bovine pericardial tissue valve  . Fracture, fibula, shaft 01/30/2015    right     Current Outpatient Prescriptions  Medication Sig Dispense Refill  . acetaminophen (TYLENOL) 500 MG tablet Take 500 mg by mouth every 6 (six) hours as needed (pain).     Marland Kitchen amiodarone (PACERONE) 200 MG tablet Take 1 tablet (200 mg total) by mouth daily. 90 tablet 3  . apixaban (ELIQUIS) 2.5 MG TABS tablet Take 1 tablet (2.5 mg total) by mouth 2 (two) times daily. 180 tablet 3  . ferrous sulfate 325 (65 FE) MG tablet Take 1 tablet (325 mg total) by mouth 2 (two) times daily with a meal. 180 tablet 1  . furosemide (LASIX) 80 MG tablet Take 1 tablet (80 mg total) by mouth daily. HOLD FOR WEIGHT LESS THAN 125LBS 30 tablet 6  . hydrALAZINE (APRESOLINE) 10 MG tablet Take 10 mg by mouth 2 (two) times daily.    . Insulin Glargine (TOUJEO SOLOSTAR) 300 UNIT/ML SOPN Inject 20 Units into the skin daily. 15 pen 3  . Insulin Lispro (HUMALOG KWIKPEN) 200 UNIT/ML SOPN Inject 0-9 Units into the skin 2 (two) times daily.    . isosorbide mononitrate (IMDUR) 30 MG 24 hr tablet Take 0.5 tablets (15 mg total) by mouth daily. 45 tablet 6  . levothyroxine (SYNTHROID, LEVOTHROID) 75 MCG tablet Take 1 tablet (75 mcg total) by mouth daily. 90 tablet 1  . metolazone (ZAROXOLYN) 2.5 MG tablet Take 2.5 mg by mouth as needed. Reported on 12/13/2015    . milrinone (PRIMACOR) 20 MG/100ML SOLN infusion Inject 14.575 mcg/min into the vein continuous. 100 mL 0  . potassium chloride SA (K-DUR,KLOR-CON) 20 MEQ tablet Take 1 tablet (20 mEq total) by mouth 2 (two) times daily. Take ( 1 tablet) in the AM and ( 2 tablets) in the PM 180 tablet 1   No current facility-administered medications  for this encounter.   Social History   Social History  . Marital Status: Divorced    Spouse Name: N/A  . Number of Children: N/A  . Years of Education: N/A   Social History Main Topics  . Smoking status: Never Smoker   . Smokeless tobacco: Never Used  . Alcohol Use: No  . Drug Use: No  . Sexual Activity: No   Other Topics Concern  . None   Social History Narrative   Patient lives alone here in town   She continues to work, helping to bind and oversew books   She is divorced after 23 years of marriage   1 son- '61, minister; 1 dtr '55; 4 grandchildren   End of life care-provided packet on living well  Family History  Problem Relation Age of Onset  . Colon cancer Mother   . Diabetes Neg Hx   . Coronary artery disease Neg Hx     Filed Vitals:   01/10/16 0958  BP: 98/66  Pulse: 78  Weight: 131 lb (59.421 kg)  SpO2: 93%     Wt Readings from Last 3 Encounters:  01/10/16 131 lb (59.421 kg)  12/31/15 127 lb (57.607 kg)  12/13/15 125 lb 8 oz (56.926 kg)    PHYSICAL EXAM: General: Elderly. NAD; Daughter present. Ambulated into the clinic using cane without difficulty HEENT: Normal Neck: supple. JVP 8-9 cm with mild HJR. Carotids 2+ bilaterally; no bruits. No thyromegaly or nodule noted. Cor: PMI normal. RRR. 1/6 SEM. +S3.    Lungs: Course sounds throughout that clear slightly with cough.  Abdomen: soft, NT, ND, no HSM. No bruits or masses. +BS  Extremities: no cyanosis, clubbing, rash. RUE PICC. Scattered ecchymosis on bilateral LEs. 1+ ankle edema.  Neuro: alert & orientedx3, cranial nerves grossly intact. Moves all 4 extremities w/o difficulty. Affect pleasant  ASSESSMENT & PLAN: 1. Acute on chronic systolic CHF: EF 65-03% (4/16).  NICM.  On chronic milrinone 0.25 mcg. Continue weekly lab work.  - NYHA IIIb on milrinone. PICC site OK.  - Volume status mildly elevated.  - Increase lasix to 80 mg BID x 2 days then drop back down to 80 mg daily. - Take metolazone  as needed for weights of 3 lbs over night or 5 lbs in a week.  Check BMET next week with [facility]  - Increase K 40 meq BID. Take additional 20 meq on metolazone days. Will check BMET today. Did not take extra K with metolazone on Tuesday. Will get [facility] to recheck next week.  - Continue hydralazine 10 mg BID and imdur 15 mg daily.  - No ARB/ACE with AKI and hypotension. 2. Carotid stenosis:  - Dopplers 12/2014 40-59% RICA stenosis stable. Repeat as needed 3. Hyperlipidemia:  - Statin d/c'd in June as non-essential med with new DNR/DNI status. - Last lipid check 12/2014. Stable. 4.  Aortic stenosis/mitral regurgitation:  - s/p bioprosthetic AVR/MVR stable last echo 01/2015.  5. CKD stage 4 - Stable 6. PAF:   - in NSR on exam.  - Continue apixaban 2.5 mg BID and amio 200 mg daily.  - No bleeding on Eliquis. 7. St Jude CRT-D  - Sees EP 8. DM, Insulin dependent - Per PCP.  9. Productive cough - Did have fever of 100 last week but none since.  Productive cough yellow/green sputum.  Will get CXR to look for pneumonia in addition to volume overload. Have follow up with PCP.  10. Confusion/Weakness - Pt also has confusion and weakness and smells very strongly of urine. Daughter to get PCP for further work up of possible UTI.   Increase lasix for 2 days as above.  BMET/BNP/CBC and CXR today. Will repeat labs with [facility] next week and have   Graciella Freer, PA-C 10:00 AM

## 2016-01-10 ENCOUNTER — Ambulatory Visit (HOSPITAL_COMMUNITY)
Admission: RE | Admit: 2016-01-10 | Discharge: 2016-01-10 | Disposition: A | Discharge status: Home / Self Care | Payer: Medicare Other | Source: Ambulatory Visit | Attending: Student | Admitting: Student

## 2016-01-10 ENCOUNTER — Ambulatory Visit (HOSPITAL_COMMUNITY)
Admission: RE | Admit: 2016-01-10 | Discharge: 2016-01-10 | Disposition: A | Discharge status: Home / Self Care | Payer: Medicare Other | Source: Ambulatory Visit | Attending: Cardiology | Admitting: Cardiology

## 2016-01-10 ENCOUNTER — Encounter (HOSPITAL_COMMUNITY): Payer: Self-pay

## 2016-01-10 VITALS — BP 98/66 | HR 78 | Wt 131.0 lb

## 2016-01-10 DIAGNOSIS — Z79899 Other long term (current) drug therapy: Secondary | ICD-10-CM | POA: Diagnosis not present

## 2016-01-10 DIAGNOSIS — R918 Other nonspecific abnormal finding of lung field: Secondary | ICD-10-CM | POA: Diagnosis not present

## 2016-01-10 DIAGNOSIS — I5022 Chronic systolic (congestive) heart failure: Secondary | ICD-10-CM | POA: Diagnosis not present

## 2016-01-10 DIAGNOSIS — I5023 Acute on chronic systolic (congestive) heart failure: Secondary | ICD-10-CM | POA: Diagnosis not present

## 2016-01-10 DIAGNOSIS — I480 Paroxysmal atrial fibrillation: Secondary | ICD-10-CM

## 2016-01-10 DIAGNOSIS — N184 Chronic kidney disease, stage 4 (severe): Secondary | ICD-10-CM

## 2016-01-10 DIAGNOSIS — E1122 Type 2 diabetes mellitus with diabetic chronic kidney disease: Secondary | ICD-10-CM | POA: Insufficient documentation

## 2016-01-10 DIAGNOSIS — I130 Hypertensive heart and chronic kidney disease with heart failure and stage 1 through stage 4 chronic kidney disease, or unspecified chronic kidney disease: Secondary | ICD-10-CM | POA: Insufficient documentation

## 2016-01-10 DIAGNOSIS — I517 Cardiomegaly: Secondary | ICD-10-CM | POA: Insufficient documentation

## 2016-01-10 DIAGNOSIS — E1151 Type 2 diabetes mellitus with diabetic peripheral angiopathy without gangrene: Secondary | ICD-10-CM | POA: Insufficient documentation

## 2016-01-10 DIAGNOSIS — Z7901 Long term (current) use of anticoagulants: Secondary | ICD-10-CM | POA: Diagnosis not present

## 2016-01-10 DIAGNOSIS — E785 Hyperlipidemia, unspecified: Secondary | ICD-10-CM | POA: Insufficient documentation

## 2016-01-10 DIAGNOSIS — I509 Heart failure, unspecified: Secondary | ICD-10-CM | POA: Diagnosis present

## 2016-01-10 DIAGNOSIS — R05 Cough: Secondary | ICD-10-CM | POA: Diagnosis not present

## 2016-01-10 DIAGNOSIS — R058 Other specified cough: Secondary | ICD-10-CM

## 2016-01-10 LAB — BASIC METABOLIC PANEL
Anion gap: 11 (ref 5–15)
BUN: 79 mg/dL — AB (ref 6–20)
CHLORIDE: 99 mmol/L — AB (ref 101–111)
CO2: 22 mmol/L (ref 22–32)
CREATININE: 2.28 mg/dL — AB (ref 0.44–1.00)
Calcium: 9.3 mg/dL (ref 8.9–10.3)
GFR calc Af Amer: 22 mL/min — ABNORMAL LOW (ref >60)
GFR calc non Af Amer: 19 mL/min — ABNORMAL LOW (ref >60)
Glucose, Bld: 280 mg/dL — ABNORMAL HIGH (ref 65–99)
Potassium: 3.8 mmol/L (ref 3.5–5.1)
SODIUM: 132 mmol/L — AB (ref 135–145)

## 2016-01-10 LAB — CBC
HCT: 35.4 % — ABNORMAL LOW (ref 36.0–46.0)
Hemoglobin: 11.5 g/dL — ABNORMAL LOW (ref 12.0–15.0)
MCH: 29.9 pg (ref 26.0–34.0)
MCHC: 32.5 g/dL (ref 30.0–36.0)
MCV: 92.2 fL (ref 78.0–100.0)
PLATELETS: 171 10*3/uL (ref 150–400)
RBC: 3.84 MIL/uL — AB (ref 3.87–5.11)
RDW: 14.9 % (ref 11.5–15.5)
WBC: 7.3 10*3/uL (ref 4.0–10.5)

## 2016-01-10 LAB — BRAIN NATRIURETIC PEPTIDE: B NATRIURETIC PEPTIDE 5: 1742.3 pg/mL — AB (ref 0.0–100.0)

## 2016-01-10 NOTE — Progress Notes (Signed)
Advanced Heart Failure Medication Review by a Pharmacist  Does the patient  feel that his/her medications are working for him/her?  yes  Has the patient been experiencing any side effects to the medications prescribed?  no  Does the patient measure his/her own blood pressure or blood glucose at home?  no   Does the patient have any problems obtaining medications due to transportation or finances?   no  Understanding of regimen: good Understanding of indications: good Potential of compliance: good Patient understands to avoid NSAIDs. Patient understands to avoid decongestants.  Issues to address at subsequent visits: None   Pharmacist comments: 82 YO pleasant female presents with family members. Family reports gave 2.5 mg metolazone on 3/21 but no extra potassium. Pt reports having cough/cold sx and is taking Robitussin DM. Recommended she use sugar free halls cough drops to help with the cough. Pt and family deny s/sx of bleeding on Eliquis or SE to other medications.    Time with patient: 5 min  Preparation and documentation time: 5 min  Total time: 10 min

## 2016-01-10 NOTE — Patient Instructions (Signed)
INCREASE  Lasix to 80 mg twice a day for 2 days INCREASE Potassium to 40 meq twice a day  Labs today and again in one week with [facility]   A chest x-ray takes a picture of the organs and structures inside the chest, including the heart, lungs, and blood vessels. This test can show several things, including, whether the heart is enlarges; whether fluid is building up in the lungs; and whether pacemaker / defibrillator leads are still in place.   Your physician recommends that you schedule a follow-up appointment in: 2 weeks with [facility]

## 2016-01-11 ENCOUNTER — Encounter: Payer: Self-pay | Admitting: Nurse Practitioner

## 2016-01-11 ENCOUNTER — Ambulatory Visit (INDEPENDENT_AMBULATORY_CARE_PROVIDER_SITE_OTHER): Payer: Medicare Other | Admitting: Nurse Practitioner

## 2016-01-11 ENCOUNTER — Ambulatory Visit (HOSPITAL_COMMUNITY)
Admission: RE | Admit: 2016-01-11 | Discharge: 2016-01-11 | Disposition: A | Discharge status: Home / Self Care | Payer: Medicare Other | Source: Ambulatory Visit | Attending: Cardiology | Admitting: Cardiology

## 2016-01-11 VITALS — BP 90/40 | HR 80 | Temp 98.3°F | Ht 64.0 in | Wt 126.8 lb

## 2016-01-11 DIAGNOSIS — I5042 Chronic combined systolic (congestive) and diastolic (congestive) heart failure: Secondary | ICD-10-CM | POA: Diagnosis not present

## 2016-01-11 DIAGNOSIS — I6523 Occlusion and stenosis of bilateral carotid arteries: Secondary | ICD-10-CM

## 2016-01-11 DIAGNOSIS — E119 Type 2 diabetes mellitus without complications: Secondary | ICD-10-CM | POA: Diagnosis not present

## 2016-01-11 DIAGNOSIS — R05 Cough: Secondary | ICD-10-CM | POA: Diagnosis not present

## 2016-01-11 DIAGNOSIS — R058 Other specified cough: Secondary | ICD-10-CM

## 2016-01-11 DIAGNOSIS — I110 Hypertensive heart disease with heart failure: Secondary | ICD-10-CM | POA: Diagnosis not present

## 2016-01-11 DIAGNOSIS — E785 Hyperlipidemia, unspecified: Secondary | ICD-10-CM | POA: Insufficient documentation

## 2016-01-11 DIAGNOSIS — I739 Peripheral vascular disease, unspecified: Secondary | ICD-10-CM | POA: Diagnosis not present

## 2016-01-11 NOTE — Progress Notes (Signed)
Pre visit review using our clinic review tool, if applicable. No additional management support is needed unless otherwise documented below in the visit note. 

## 2016-01-11 NOTE — Patient Instructions (Signed)
Looking good! Allegra/Claritin/ or Zyrtec (pick one) to take for symptoms.   Continue Robitussin DM cough syrup.   Call us if anything changes (fever, confusion ect...) OR seek care this weekend at an urgent care or the emergency room if needed.

## 2016-01-11 NOTE — Progress Notes (Signed)
Patient ID: Joanna [name], female    DOB: 02/15/1930  Age: 82 y.o. MRN: [id number]  CC: Chest Congestion   HPI Seba H Goynes presents for CC of cough/congestion x 1 week. She is accompanied by her daughter today.  1) Pt established care for Chronic Systolic HF yesterday with Maxine Glenn, PA-C who had a CXR done yesterday  BNP- was elevated and they upped the lasix and K+  CXR results- Cardiomegaly- stable  COPD- suspected chronic   Stable left lung base opacity   16.87 CrCl   Rhinorrhea- clear  Sneezing watery eyes  Robitussin- helpful   2) Daughter also reports she had been acting funny a few nights early this week such as leaving pants in hall and loosing bowels/bladder. This has all since resolved for 2 days.   History Mahagony has a past medical history of Peripheral artery disease (HCC); Hyperlipidemia; Arthritis; Aortic stenosis ( ); BUNDLE BRANCH BLOCK, LEFT ( ); CAROTID ARTERY STENOSIS (01/05/2009); CVA (03/14/2010); PERIPHERAL VASCULAR DISEASE (07/30/2007); Chronic combined systolic and diastolic CHF (congestive heart failure) (HCC); Diabetes mellitus; HTN (hypertension); Iron deficiency anemia (09/21/2011); History of shingles; Mitral regurgitation; S/P aortic valve replacement with bioprosthetic valve (06/15/2013); S/P mitral valve replacement with bioprosthetic valve (06/15/2013); and Fracture, fibula, shaft (01/30/2015).   She has past surgical history that includes Carotid endarterectomy (Left, 2011); Polypectomy (12/27/04); Cataract extraction (Bilateral, 2009); TEE without cardioversion (N/A, 05/19/2013); Colonoscopy; Cardiac catheterization (2012); Aortic valve replacement (N/A, 06/15/2013); Intraoprative transesophageal echocardiogram (N/A, 06/15/2013); Mitral valve replacement (N/A, 06/15/2013); Epicardial pacing lead placement (N/A, 06/15/2013); left and right heart catheterization with coronary angiogram (N/A, 09/19/2011); ORIF ankle fracture (Right, 01/31/2015); Cardiac  catheterization (N/A, 03/21/2015); and Pacemaker insertion.   Her family history includes Colon cancer in her mother. There is no history of Diabetes or Coronary artery disease.She reports that she has never smoked. She has never used smokeless tobacco. She reports that she does not drink alcohol or use illicit drugs.  Outpatient Prescriptions Prior to Visit  Medication Sig Dispense Refill  . acetaminophen (TYLENOL) 500 MG tablet Take 500 mg by mouth every 6 (six) hours as needed (pain).     Marland Kitchen amiodarone (PACERONE) 200 MG tablet Take 1 tablet (200 mg total) by mouth daily. 90 tablet 3  . apixaban (ELIQUIS) 2.5 MG TABS tablet Take 1 tablet (2.5 mg total) by mouth 2 (two) times daily. 180 tablet 3  . dextromethorphan-guaiFENesin (ROBITUSSIN-DM) 10-100 MG/5ML liquid Take 10 mLs by mouth every 4 (four) hours as needed for cough.    . ferrous sulfate 325 (65 FE) MG tablet Take 1 tablet (325 mg total) by mouth 2 (two) times daily with a meal. 180 tablet 1  . furosemide (LASIX) 80 MG tablet Take 1 tablet (80 mg total) by mouth daily. HOLD FOR WEIGHT LESS THAN 125LBS 30 tablet 6  . hydrALAZINE (APRESOLINE) 10 MG tablet Take 10 mg by mouth 2 (two) times daily.    . Insulin Glargine (TOUJEO SOLOSTAR) 300 UNIT/ML SOPN Inject 20 Units into the skin daily. 15 pen 3  . Insulin Lispro (HUMALOG KWIKPEN) 200 UNIT/ML SOPN Inject 0-9 Units into the skin 2 (two) times daily.    . isosorbide mononitrate (IMDUR) 30 MG 24 hr tablet Take 0.5 tablets (15 mg total) by mouth daily. 45 tablet 6  . levothyroxine (SYNTHROID, LEVOTHROID) 75 MCG tablet Take 1 tablet (75 mcg total) by mouth daily. 90 tablet 1  . metolazone (ZAROXOLYN) 2.5 MG tablet Take 2.5 mg by mouth as needed. Reported on  12/13/2015    . milrinone (PRIMACOR) 20 MG/100ML SOLN infusion Inject 14.575 mcg/min into the vein continuous. 100 mL 0  . potassium chloride SA (K-DUR,KLOR-CON) 20 MEQ tablet Take 20-40 mEq by mouth 2 (two) times daily. Take 20 meq in the  morning and 40 meq in the evening     No facility-administered medications prior to visit.    ROS Review of Systems  Constitutional: Positive for fatigue. Negative for fever, chills and diaphoresis.  HENT: Positive for congestion, postnasal drip, rhinorrhea, sneezing and sore throat. Negative for sinus pressure.   Respiratory: Positive for cough. Negative for chest tightness, shortness of breath and wheezing.   Cardiovascular: Negative for chest pain, palpitations and leg swelling.  Gastrointestinal: Negative for nausea, vomiting and diarrhea.  Skin: Negative for rash.  Neurological: Negative for dizziness and headaches.    Objective:  BP 90/40 mmHg  Pulse 80  Temp(Src) 98.3 F (36.8 C) (Oral)  Ht  (1.626 m)  Wt 126 lb 12 oz (57.493 kg)  BMI 21.75 kg/m2  SpO2 97%  Physical Exam  Constitutional: She is oriented to person, place, and time. She appears well-developed and well-nourished. No distress.  HENT:  Head: Normocephalic and atraumatic.  Right Ear: External ear normal.  Left Ear: External ear normal.  Mouth/Throat: Oropharynx is clear and moist. No oropharyngeal exudate.  Eyes: EOM are normal. Pupils are equal, round, and reactive to light. Right eye exhibits no discharge. Left eye exhibits no discharge. No scleral icterus.  Neck: Normal range of motion. Neck supple.  Cardiovascular: Normal rate and regular rhythm.   Pulmonary/Chest: Effort normal. No respiratory distress. She has no wheezes. She has no rales. She exhibits no tenderness.  Coarse sounds throughout that clear somewhat with coughing still heard today  Lymphadenopathy:    She has no cervical adenopathy.  Neurological: She is alert and oriented to person, place, and time.  Skin: Skin is warm and dry. No rash noted. She is not diaphoretic.  Psychiatric: She has a normal mood and affect. Her behavior is normal. Judgment and thought content normal.   Assessment & Plan:   Aaliyha was seen today for chest  congestion.  Diagnoses and all orders for this visit:  Productive cough  I am having Ms. Virgo maintain her ferrous sulfate, acetaminophen, apixaban, hydrALAZINE, amiodarone, isosorbide mononitrate, milrinone, Insulin Lispro, furosemide, metolazone, levothyroxine, Insulin Glargine, dextromethorphan-guaiFENesin, and potassium chloride SA.  No orders of the defined types were placed in this encounter.     Follow-up: Return if symptoms worsen or fail to improve.

## 2016-01-11 NOTE — Assessment & Plan Note (Addendum)
Stable.  CXR chronic without acute concerns- couldn't r/out LL lobe superimposed pna- normal WBC and pt appears well in office. - 3 lbs from yesterday with the increase in lasix. Feels improved. Discussed using claritin/allegra/or zyrtec for symptoms once daily FU with PCP or seek emergency care if worsening of symptoms  Consulted with Dr. Okey Dupre about CXR and plan

## 2016-01-14 ENCOUNTER — Emergency Department (HOSPITAL_COMMUNITY): Payer: Medicare Other

## 2016-01-14 ENCOUNTER — Encounter (HOSPITAL_COMMUNITY): Payer: Self-pay

## 2016-01-14 ENCOUNTER — Emergency Department (HOSPITAL_COMMUNITY)
Admission: EM | Admit: 2016-01-14 | Discharge: 2016-01-14 | Disposition: A | Discharge status: Home / Self Care | Payer: Medicare Other | Attending: Emergency Medicine | Admitting: Emergency Medicine

## 2016-01-14 ENCOUNTER — Telehealth: Payer: Self-pay | Admitting: Internal Medicine

## 2016-01-14 DIAGNOSIS — Z7901 Long term (current) use of anticoagulants: Secondary | ICD-10-CM | POA: Diagnosis not present

## 2016-01-14 DIAGNOSIS — W1839XA Other fall on same level, initial encounter: Secondary | ICD-10-CM | POA: Diagnosis not present

## 2016-01-14 DIAGNOSIS — Z8673 Personal history of transient ischemic attack (TIA), and cerebral infarction without residual deficits: Secondary | ICD-10-CM | POA: Diagnosis not present

## 2016-01-14 DIAGNOSIS — Z794 Long term (current) use of insulin: Secondary | ICD-10-CM | POA: Insufficient documentation

## 2016-01-14 DIAGNOSIS — Y9389 Activity, other specified: Secondary | ICD-10-CM | POA: Insufficient documentation

## 2016-01-14 DIAGNOSIS — Z79899 Other long term (current) drug therapy: Secondary | ICD-10-CM | POA: Insufficient documentation

## 2016-01-14 DIAGNOSIS — S8012XA Contusion of left lower leg, initial encounter: Secondary | ICD-10-CM | POA: Insufficient documentation

## 2016-01-14 DIAGNOSIS — Z8619 Personal history of other infectious and parasitic diseases: Secondary | ICD-10-CM | POA: Diagnosis not present

## 2016-01-14 DIAGNOSIS — R42 Dizziness and giddiness: Secondary | ICD-10-CM | POA: Insufficient documentation

## 2016-01-14 DIAGNOSIS — Y92009 Unspecified place in unspecified non-institutional (private) residence as the place of occurrence of the external cause: Secondary | ICD-10-CM | POA: Insufficient documentation

## 2016-01-14 DIAGNOSIS — E119 Type 2 diabetes mellitus without complications: Secondary | ICD-10-CM | POA: Diagnosis not present

## 2016-01-14 DIAGNOSIS — I10 Essential (primary) hypertension: Secondary | ICD-10-CM | POA: Insufficient documentation

## 2016-01-14 DIAGNOSIS — I5042 Chronic combined systolic (congestive) and diastolic (congestive) heart failure: Secondary | ICD-10-CM | POA: Diagnosis not present

## 2016-01-14 DIAGNOSIS — R05 Cough: Secondary | ICD-10-CM | POA: Diagnosis not present

## 2016-01-14 DIAGNOSIS — R011 Cardiac murmur, unspecified: Secondary | ICD-10-CM | POA: Insufficient documentation

## 2016-01-14 DIAGNOSIS — W19XXXA Unspecified fall, initial encounter: Secondary | ICD-10-CM

## 2016-01-14 DIAGNOSIS — Z8781 Personal history of (healed) traumatic fracture: Secondary | ICD-10-CM | POA: Insufficient documentation

## 2016-01-14 DIAGNOSIS — M1990 Unspecified osteoarthritis, unspecified site: Secondary | ICD-10-CM | POA: Diagnosis not present

## 2016-01-14 DIAGNOSIS — S8992XA Unspecified injury of left lower leg, initial encounter: Secondary | ICD-10-CM | POA: Diagnosis present

## 2016-01-14 DIAGNOSIS — D509 Iron deficiency anemia, unspecified: Secondary | ICD-10-CM | POA: Diagnosis not present

## 2016-01-14 DIAGNOSIS — Z950 Presence of cardiac pacemaker: Secondary | ICD-10-CM | POA: Insufficient documentation

## 2016-01-14 DIAGNOSIS — Y998 Other external cause status: Secondary | ICD-10-CM | POA: Insufficient documentation

## 2016-01-14 DIAGNOSIS — Z9889 Other specified postprocedural states: Secondary | ICD-10-CM | POA: Diagnosis not present

## 2016-01-14 LAB — CBC WITH DIFFERENTIAL/PLATELET
Basophils Absolute: 0 10*3/uL (ref 0.0–0.1)
Basophils Relative: 0 %
Eosinophils Absolute: 0 10*3/uL (ref 0.0–0.7)
Eosinophils Relative: 0 %
HCT: 36.5 % (ref 36.0–46.0)
Hemoglobin: 12.5 g/dL (ref 12.0–15.0)
Lymphocytes Relative: 7 %
Lymphs Abs: 0.8 10*3/uL (ref 0.7–4.0)
MCH: 31.5 pg (ref 26.0–34.0)
MCHC: 34.2 g/dL (ref 30.0–36.0)
MCV: 91.9 fL (ref 78.0–100.0)
Monocytes Absolute: 0.3 10*3/uL (ref 0.1–1.0)
Monocytes Relative: 3 %
Neutro Abs: 9.2 10*3/uL — ABNORMAL HIGH (ref 1.7–7.7)
Neutrophils Relative %: 90 %
Platelets: 196 10*3/uL (ref 150–400)
RBC: 3.97 MIL/uL (ref 3.87–5.11)
RDW: 14.5 % (ref 11.5–15.5)
WBC: 10.3 10*3/uL (ref 4.0–10.5)

## 2016-01-14 LAB — BASIC METABOLIC PANEL
Anion gap: 12 (ref 5–15)
BUN: 71 mg/dL — AB (ref 6–20)
CALCIUM: 9.2 mg/dL (ref 8.9–10.3)
CO2: 26 mmol/L (ref 22–32)
Chloride: 101 mmol/L (ref 101–111)
Creatinine, Ser: 2 mg/dL — ABNORMAL HIGH (ref 0.44–1.00)
GFR calc Af Amer: 25 mL/min — ABNORMAL LOW (ref >60)
GFR, EST NON AFRICAN AMERICAN: 22 mL/min — AB (ref >60)
GLUCOSE: 171 mg/dL — AB (ref 65–99)
Potassium: 3.2 mmol/L — ABNORMAL LOW (ref 3.5–5.1)
Sodium: 139 mmol/L (ref 135–145)

## 2016-01-14 LAB — PROTIME-INR
INR: 1.3 (ref 0.00–1.49)
Prothrombin Time: 16.3 seconds — ABNORMAL HIGH (ref 11.6–15.2)

## 2016-01-14 LAB — CBG MONITORING, ED: Glucose-Capillary: 127 mg/dL — ABNORMAL HIGH (ref 65–99)

## 2016-01-14 MED ORDER — ACETAMINOPHEN 500 MG PO TABS
500.0000 mg | ORAL_TABLET | Freq: Once | ORAL | Status: AC
  Administered 2016-01-14: 500 mg via ORAL
  Filled 2016-01-14: qty 1

## 2016-01-14 NOTE — ED Provider Notes (Signed)
CSN: [id number]     Arrival date & time 01/14/16  1105 History   First MD Initiated Contact with Patient 01/14/16 1106     Chief Complaint  Patient presents with  . Fall     (Consider location/radiation/quality/duration/timing/severity/associated sxs/prior Treatment) Patient is a 82 y.o. female presenting with fall. The history is provided by the patient and a relative (daughter who is primary caregiver).  Fall This is a new problem. The current episode started today. Associated symptoms include arthralgias (left calf) and coughing. Pertinent negatives include no abdominal pain, chest pain, chills, diaphoresis, fever, headaches, myalgias, nausea, rash, sore throat, vertigo or vomiting. Associated symptoms comments: Patient felt lightheaded and had glucose in high 50's at time of fall per daughter. Exacerbated by: recent adjustment of long acting insulin, felt light headed in the morning.    Past Medical History  Diagnosis Date  . Peripheral artery disease (HCC)   . Hyperlipidemia        . Arthritis   . Aortic stenosis      a. s/p AVR  . BUNDLE BRANCH BLOCK, LEFT    . CAROTID ARTERY STENOSIS 01/05/2009  . CVA 03/14/2010  . PERIPHERAL VASCULAR DISEASE 07/30/2007  . Chronic combined systolic and diastolic CHF (congestive heart failure) (HCC)     a. RHC 09/2011 RA 8, RV 58/2/11; PA 54/22 (37) PCWP 20; F CO/CI 4.57/2.67 PVR 3.7; b. L main no sig dz, LAD luminal irreg; LCx lg Ramus with luminal irreg, small AV Lcx witout sig dz, RCA luminal irreg; severe mitral annular calcifications; AV calcified c. EF 45-50% (07/2013);  d. 11/2014 Echo: EF 20-25%, diff HK, nl AV/MV, sev dil LA, mod TR/PR, PASP .  . Diabetes mellitus   . HTN (hypertension)   . Iron deficiency anemia 09/21/2011  . History of shingles   . Mitral regurgitation     a. s/p MVR  . S/P aortic valve replacement with bioprosthetic valve 06/15/2013    21 mm [facility] Ease bovine pericardial tissue valve  . S/P mitral valve  replacement with bioprosthetic valve 06/15/2013    25 mm [facility] Mitral bovine pericardial tissue valve  . Fracture, fibula, shaft 01/30/2015    right   Past Surgical History  Procedure Laterality Date  . Carotid endarterectomy Left 2011  . Polypectomy  12/27/04  . Cataract extraction Bilateral 2009  . Tee without cardioversion N/A 05/19/2013    Procedure: TRANSESOPHAGEAL ECHOCARDIOGRAM (TEE);  Surgeon: Laurey Morale, MD;  Location: [facility] ENDOSCOPY;  Service: Cardiovascular;  Laterality: N/A;  . Colonoscopy    . Cardiac catheterization  2012  . Aortic valve replacement N/A 06/15/2013    Procedure: AORTIC VALVE REPLACEMENT (AVR);  Surgeon: Purcell Nails, MD;  Location: [facility] OR;  Service: Open Heart Surgery;  Laterality: N/A;  . Intraoperative transesophageal echocardiogram N/A 06/15/2013    Procedure: INTRAOPERATIVE TRANSESOPHAGEAL ECHOCARDIOGRAM;  Surgeon: Purcell Nails, MD;  Location: [facility] OR;  Service: Open Heart Surgery;  Laterality: N/A;  . Mitral valve replacement N/A 06/15/2013    Procedure: MITRAL VALVE (MV) REPLACEMENT;  Surgeon: Purcell Nails, MD;  Location: [facility] OR;  Service: Open Heart Surgery;  Laterality: N/A;  . Epicardial pacing lead placement N/A 06/15/2013    Procedure: EPICARDIAL PACING LEAD PLACEMENT;  Surgeon: Purcell Nails, MD;  Location: [facility] OR;  Service: Open Heart Surgery;  Laterality: N/A;  . Left and right heart catheterization with coronary angiogram N/A 09/19/2011    Procedure: LEFT AND RIGHT HEART CATHETERIZATION WITH CORONARY  Rosalin Hawking;  Surgeon: Dolores Patty, MD;  Location: [facility] CATH LAB;  Service: Cardiovascular;  Laterality: N/A;  . Orif ankle fracture Right 01/31/2015    Procedure: OPEN REDUCTION INTERNAL FIXATION (ORIF) ANKLE FRACTURE;  Surgeon: Eldred Manges, MD;  Location: [facility] OR;  Service: Orthopedics;  Laterality: Right;  . Ep implantable device N/A 03/21/2015    Procedure: BiV Pacemaker Insertion CRT-P;  Surgeon: Duke Salvia, MD;  Location: [facility]  INVASIVE CV LAB;  Service: Cardiovascular;  Laterality: N/A;  . Pacemaker insertion     Family History  Problem Relation Age of Onset  . Colon cancer Mother   . Diabetes Neg Hx   . Coronary artery disease Neg Hx    Social History  Substance Use Topics  . Smoking status: Never Smoker   . Smokeless tobacco: Never Used  . Alcohol Use: No   OB History    No data available     Review of Systems  Constitutional: Negative for fever, chills, diaphoresis, activity change and appetite change.  HENT: Negative for facial swelling, rhinorrhea, sore throat, trouble swallowing and voice change.   Eyes: Negative for photophobia, pain and visual disturbance.  Respiratory: Positive for cough. Negative for shortness of breath, wheezing and stridor.   Cardiovascular: Negative for chest pain, palpitations and leg swelling.  Gastrointestinal: Negative for nausea, vomiting, abdominal pain, constipation and anal bleeding.  Endocrine: Negative.   Genitourinary: Negative for dysuria, vaginal bleeding, vaginal discharge and vaginal pain.  Musculoskeletal: Positive for arthralgias (left calf). Negative for myalgias and back pain.  Skin: Negative.  Negative for rash.  Allergic/Immunologic: Negative.   Neurological: Positive for light-headedness. Negative for dizziness (no vertigo), vertigo, tremors, syncope and headaches.  Psychiatric/Behavioral: Negative for suicidal ideas, sleep disturbance and self-injury.  All other systems reviewed and are negative.     Allergies  Other and Prednisone  Home Medications   Prior to Admission medications   Medication Sig Start Date End Date Taking? Authorizing Provider  acetaminophen (TYLENOL) 500 MG tablet Take 500 mg by mouth every 6 (six) hours as needed (pain).    Yes Historical Provider, MD  amiodarone (PACERONE) 200 MG tablet Take 1 tablet (200 mg total) by mouth daily. 06/28/15  Yes Dolores Patty, MD  apixaban (ELIQUIS) 2.5 MG TABS tablet Take 1 tablet  (2.5 mg total) by mouth 2 (two) times daily. 06/21/15  Yes Laurey Morale, MD  dextromethorphan-guaiFENesin (ROBITUSSIN-DM) 10-100 MG/5ML liquid Take 10 mLs by mouth every 4 (four) hours as needed for cough.   Yes Historical Provider, MD  ferrous sulfate 325 (65 FE) MG tablet Take 1 tablet (325 mg total) by mouth 2 (two) times daily with a meal. 02/28/14  Yes Etta Grandchild, MD  furosemide (LASIX) 80 MG tablet Take 1 tablet (80 mg total) by mouth daily. HOLD FOR WEIGHT LESS THAN 125LBS 11/14/15  Yes Dolores Patty, MD  hydrALAZINE (APRESOLINE) 10 MG tablet Take 10 mg by mouth 2 (two) times daily.   Yes Historical Provider, MD  Insulin Glargine (TOUJEO SOLOSTAR) 300 UNIT/ML SOPN Inject 20 Units into the skin daily. 01/01/16  Yes Etta Grandchild, MD  Insulin Lispro (HUMALOG KWIKPEN) 200 UNIT/ML SOPN Inject 0-9 Units into the skin 2 (two) times daily.   Yes Historical Provider, MD  isosorbide mononitrate (IMDUR) 30 MG 24 hr tablet Take 0.5 tablets (15 mg total) by mouth daily. 08/14/15  Yes Bevelyn Buckles Bensimhon, MD  levothyroxine (SYNTHROID, LEVOTHROID) 75 MCG tablet Take 1 tablet (75 mcg  total) by mouth daily. 12/31/15  Yes Etta Grandchild, MD  metolazone (ZAROXOLYN) 2.5 MG tablet Take 2.5 mg by mouth daily as needed (blood pressure). Reported on 12/13/2015   Yes Historical Provider, MD  milrinone (PRIMACOR) 20 MG/100ML SOLN infusion Inject 14.575 mcg/min into the vein continuous. 09/27/15  Yes Christiane Ha, MD  potassium chloride SA (K-DUR,KLOR-CON) 20 MEQ tablet Take 20-40 mEq by mouth 2 (two) times daily. Take 20 meq in the morning and 40 meq in the evening   Yes Historical Provider, MD   BP 100/59 mmHg  Pulse 71  Temp(Src) 98 F (36.7 C) (Oral)  Resp 17  SpO2 96% Physical Exam  Constitutional: She is oriented to person, place, and time. She appears well-developed and well-nourished. No distress.  HENT:  Head: Normocephalic and atraumatic.  Right Ear: External ear normal.  Left Ear: External  ear normal.  Mouth/Throat: Oropharynx is clear and moist. No oropharyngeal exudate.  Eyes: Conjunctivae and EOM are normal. Pupils are equal, round, and reactive to light. No scleral icterus.  Neck: Normal range of motion. Neck supple. No JVD present. No tracheal deviation present. No thyromegaly present.  Cardiovascular: Normal rate, regular rhythm and intact distal pulses.   Murmur heard. Pulmonary/Chest: Effort normal and breath sounds normal. No respiratory distress. She has no wheezes. She has no rales.  Abdominal: Soft. Bowel sounds are normal. She exhibits no distension. There is no tenderness.  Musculoskeletal: Normal range of motion. She exhibits tenderness (bruising and tenderness to left posterior mid calf. Full ROM of all joints.). She exhibits no edema.  Neurological: She is alert and oriented to person, place, and time. No cranial nerve deficit. She exhibits normal muscle tone. Coordination normal.  GCS 15. 5/5 strength in all 4 extremities. Sensation intact in all 4 extremities.   Skin: Skin is warm and dry. She is not diaphoretic. No pallor.  Psychiatric: She has a normal mood and affect. She expresses no homicidal and no suicidal ideation. She expresses no suicidal plans and no homicidal plans.  Nursing note and vitals reviewed.   ED Course  Procedures (including critical care time) Labs Review Labs Reviewed  CBC WITH DIFFERENTIAL/PLATELET - Abnormal; Notable for the following:    Neutro Abs 9.2 (*)    All other components within normal limits  BASIC METABOLIC PANEL - Abnormal; Notable for the following:    Potassium 3.2 (*)    Glucose, Bld 171 (*)    BUN 71 (*)    Creatinine, Ser 2.00 (*)    GFR calc non Af Amer 22 (*)    GFR calc Af Amer 25 (*)    All other components within normal limits  PROTIME-INR - Abnormal; Notable for the following:    Prothrombin Time 16.3 (*)    All other components within normal limits  CBG MONITORING, ED - Abnormal; Notable for the  following:    Glucose-Capillary 127 (*)    All other components within normal limits    Imaging Review Dg Chest 2 View  01/14/2016  CLINICAL DATA:  Blacked out, fall, pain posterior lower leg region. EXAM: CHEST  2 VIEW COMPARISON:  Chest x-rays dated 01/10/2016 and 08/16/2015. FINDINGS: Cardiomegaly is stable. Overall cardiomediastinal silhouette is stable in size and configuration. Left chest wall pacemaker/ AICD stable in position. Valve prosthesis also stable in position. Right-sided PICC line remains well positioned with tip at the level of the mid/ lower SVC. Lungs are hyperexpanded suggesting COPD. Suspect associated chronic bronchitic changes centrally. No  new lung findings seen. No pleural effusion or pneumothorax seen. Degenerative spurring again noted within the slightly kyphotic thoracic spine. Median sternotomy wires appear intact and stable in alignment. No acute-appearing osseous abnormality. IMPRESSION: Stable chest x-ray.  No acute findings. Electronically Signed   By: Bary Richard M.D.   On: 01/14/2016 13:12   Dg Tibia/fibula Left  01/14/2016  CLINICAL DATA:  Pain following fall EXAM: LEFT TIBIA AND FIBULA - 2 VIEW COMPARISON:  None. FINDINGS: Frontal and lateral views were obtained. There is no appreciable acute fracture or dislocation. There is evidence of an old healed fracture of the proximal fibula with remodeling. There is mild osteoarthritic change in the knee and ankle joints. There are multiple foci of arterial vascular calcification as well as occasional phleboliths. IMPRESSION: No acute fracture or dislocation. Evidence of old trauma with remodeling proximal fibula. Osteoarthritic change in the knee and ankle joints. Electronically Signed   By: Bretta Bang III M.D.   On: 01/14/2016 13:09   I have personally reviewed and evaluated these images and lab results as part of my medical decision-making.   EKG Interpretation None      MDM   Final diagnoses:  Fall,  initial encounter    The patient is an 81 year old female with a history of insulin-dependent diabetes as well as being anticoagulated on Eliquis. She presents after a fall this morning. The patient and her daughter report that she has recently increased her long-acting nighttime insulin dose. She reports she slept later than normal today and upon waking up and felt lightheaded. She suffered a fall where she struck her left lower leg. Her daughter took her blood sugar at this time and noted to be in the 44s. She was able to eat blueberries with improvement in blood sugar and improvement in mental status. Patient completely back to baseline arrival to the emergency department. Physical exam as above. Laboratory evaluation plain films are normal. Blood glucose checked twice during the ED stay and found remain within normal limits. Feel patient is appropriate for discharge home with primary care follow-up. Standard ED return precautions given. Patient family expressed understanding and agreement with this plan.  Patient seen with attending, Dr. Lynelle Doctor, who oversaw clinical decision making.     Lula Olszewski, MD 01/14/16 1609  Linwood Dibbles, MD 01/14/16 [phone]

## 2016-01-14 NOTE — Telephone Encounter (Signed)
Pt called in and has questions about her mothers meds.  Can you call her when you get a chance ?    [phone]- cell number of daughter

## 2016-01-14 NOTE — ED Notes (Signed)
Pt transported to xray 

## 2016-01-14 NOTE — ED Notes (Signed)
[facility]- pt here from home after she fell. Pt has bruising noted to left calf area. She takes eloquis. Denies hitting her head or LOC. Pt alert and oriented on arrival. Vitals stable.

## 2016-01-15 NOTE — Telephone Encounter (Signed)
Spoke with pt daughter and states blood sugar dropped to 87 yesterday. She fell and went to ED she wants advisement on insuline doses should these be reduces. One reading was at 53. She wants to know should they stop the humalog and do stirctly tojeou?

## 2016-01-16 NOTE — Telephone Encounter (Signed)
Notified daughter [name]) w/MD response...Raechel Chute

## 2016-01-16 NOTE — Telephone Encounter (Signed)
Stop toujeo Cont humalog

## 2016-01-18 ENCOUNTER — Other Ambulatory Visit (HOSPITAL_COMMUNITY): Payer: Self-pay | Admitting: Cardiology

## 2016-01-23 ENCOUNTER — Ambulatory Visit (HOSPITAL_COMMUNITY)
Admission: RE | Admit: 2016-01-23 | Discharge: 2016-01-23 | Disposition: A | Discharge status: Home / Self Care | Payer: Medicare Other | Source: Ambulatory Visit | Attending: Cardiology | Admitting: Cardiology

## 2016-01-23 VITALS — BP 122/52 | HR 83 | Wt 127.6 lb

## 2016-01-23 DIAGNOSIS — Z95810 Presence of automatic (implantable) cardiac defibrillator: Secondary | ICD-10-CM | POA: Diagnosis not present

## 2016-01-23 DIAGNOSIS — Z7901 Long term (current) use of anticoagulants: Secondary | ICD-10-CM | POA: Diagnosis not present

## 2016-01-23 DIAGNOSIS — I739 Peripheral vascular disease, unspecified: Secondary | ICD-10-CM | POA: Diagnosis not present

## 2016-01-23 DIAGNOSIS — N183 Chronic kidney disease, stage 3 (moderate): Secondary | ICD-10-CM | POA: Diagnosis not present

## 2016-01-23 DIAGNOSIS — E1122 Type 2 diabetes mellitus with diabetic chronic kidney disease: Secondary | ICD-10-CM | POA: Insufficient documentation

## 2016-01-23 DIAGNOSIS — Z953 Presence of xenogenic heart valve: Secondary | ICD-10-CM | POA: Diagnosis not present

## 2016-01-23 DIAGNOSIS — I5022 Chronic systolic (congestive) heart failure: Secondary | ICD-10-CM

## 2016-01-23 DIAGNOSIS — I130 Hypertensive heart and chronic kidney disease with heart failure and stage 1 through stage 4 chronic kidney disease, or unspecified chronic kidney disease: Secondary | ICD-10-CM | POA: Insufficient documentation

## 2016-01-23 DIAGNOSIS — I6521 Occlusion and stenosis of right carotid artery: Secondary | ICD-10-CM | POA: Insufficient documentation

## 2016-01-23 DIAGNOSIS — I10 Essential (primary) hypertension: Secondary | ICD-10-CM

## 2016-01-23 DIAGNOSIS — R05 Cough: Secondary | ICD-10-CM | POA: Diagnosis not present

## 2016-01-23 DIAGNOSIS — Z79899 Other long term (current) drug therapy: Secondary | ICD-10-CM | POA: Diagnosis not present

## 2016-01-23 DIAGNOSIS — N184 Chronic kidney disease, stage 4 (severe): Secondary | ICD-10-CM | POA: Diagnosis not present

## 2016-01-23 DIAGNOSIS — I428 Other cardiomyopathies: Secondary | ICD-10-CM | POA: Diagnosis not present

## 2016-01-23 DIAGNOSIS — I480 Paroxysmal atrial fibrillation: Secondary | ICD-10-CM | POA: Insufficient documentation

## 2016-01-23 DIAGNOSIS — Z8673 Personal history of transient ischemic attack (TIA), and cerebral infarction without residual deficits: Secondary | ICD-10-CM | POA: Diagnosis not present

## 2016-01-23 DIAGNOSIS — D509 Iron deficiency anemia, unspecified: Secondary | ICD-10-CM | POA: Diagnosis not present

## 2016-01-23 DIAGNOSIS — E785 Hyperlipidemia, unspecified: Secondary | ICD-10-CM | POA: Insufficient documentation

## 2016-01-23 DIAGNOSIS — R058 Other specified cough: Secondary | ICD-10-CM

## 2016-01-23 DIAGNOSIS — Z794 Long term (current) use of insulin: Secondary | ICD-10-CM | POA: Diagnosis not present

## 2016-01-23 LAB — BASIC METABOLIC PANEL
ANION GAP: 12 (ref 5–15)
BUN: 77 mg/dL — ABNORMAL HIGH (ref 6–20)
CO2: 27 mmol/L (ref 22–32)
Calcium: 9.5 mg/dL (ref 8.9–10.3)
Chloride: 100 mmol/L — ABNORMAL LOW (ref 101–111)
Creatinine, Ser: 1.92 mg/dL — ABNORMAL HIGH (ref 0.44–1.00)
GFR calc Af Amer: 27 mL/min — ABNORMAL LOW (ref >60)
GFR, EST NON AFRICAN AMERICAN: 23 mL/min — AB (ref >60)
GLUCOSE: 157 mg/dL — AB (ref 65–99)
POTASSIUM: 3.9 mmol/L (ref 3.5–5.1)
Sodium: 139 mmol/L (ref 135–145)

## 2016-01-23 NOTE — Patient Instructions (Addendum)
  Labs today  CHANGE Lasix 1/2 tab (40 mg) as needed for weight less than 125, HOLD if weight less than 123  Your physician recommends that you schedule a follow-up appointment in: 2 months In the [facility]   Do the following things EVERYDAY: 1) Weigh yourself in the morning before breakfast. Write it down and keep it in a log. 2) Take your medicines as prescribed 3) Eat low salt foods-Limit salt (sodium) to 2000 mg per day.  4) Stay as active as you can everyday 5) Limit all fluids for the day to less than 2 liters 6)

## 2016-01-23 NOTE — Progress Notes (Signed)
Patient ID: Joanna [name], female   DOB: 07/16/1930, 82 y.o.   MRN: [id number]    Advanced Heart Failure Clinic Note  PCP: Dr Joanna [name] ([facility] [address]) CVTS Surgeon: Dr. Cornelius [name] Primary Cardiologist: Dr. Gala [name] DNR/DNI   HPI: [name] is a 82 year old woman with aortic stenosis s/p AVR/mitral valve replacement (06/15/2013), systolic HF due to NICM, hypertension, diabetes mellitus type II, hyperlipidemia, and peripheral artery disease.  Underwent AVR/MVR with placement of epicardial lead on 06/15/13 with PAF post-op terminated with amiodarone.  Upgraded to [facility] Jude CRT-D 03/22/2015.    Admitted from 5/27 through 03/30/2015 with acute/chronic systolic HF and cardiogenic shock. She was diuresed with IV lasix and milrinone. Unable to wean milrinone and discharged on 0.25 mcg. While hospitalized CRT-D placed on 6/2. She was discharged to [facility].   Readmitted 6/11 with hypoglycemia. Also had sepsis with HCAP and received antibiotic course. She remained on milrinone 0.375 mcg. Discharge weight was 128 pounds.   Admitted 09/24/15 from facility with fall. Xray showed left hip superior and inferior pubic ramus fracture. She was considered stable from a HF standpoint and compliant with medications. Her weight was 130 lbs, consistent with previous home weights.   Labs from 10/23/15 showed Creatinine had trended up slightly from 1.6 -> 1.8, and in speaking with her daughter pts weight had gone up slowly since going back to [facility] after her hip fracture. Her weight on 10/11/15 was 141 lbs. Appt made for 10/24/15 and instructed to take a metolazone that morning.   Presented to ED 01/14/16 s/p fall. Blood sugar had been dropping. Was in 26s at ER. PCP has adjusted her diabetes regimen. She had struck her left lower leg, X-ray negative for acute findings.   She presents today for follow up. At last visit had her take 2 days of extra lasix. That helped and weight has been stable.  Held lasix yesterday for weight of 124,  but was up 6 lbs this morning and took metolazone. Breathing is OK.  Watching salt and fluid intake.  Confusion has improved at home but still on and off. Cough has somewhat improved with robitussin. She does get SOB with showering and does need help with her ADLs, but she is improving. Only occasional lightheadedness. When standing and doing something for long periods and/or getting SOB.   05/19/2013 ECHO EF 35% severe AS (low gradient severe AS confirmed by DSE), gradient: 32 mm Hg (S). Peak gradient: 57mm Hg  08/18/13 ECHO EF 45-50% AV working well 2/16 ECHO with EF 20-25%, moderately dilated LV with mild LVH, normal bioprosthetic mitral and aortic valves, moderate TR, PA systolic pressure 54 mmHg.  4/16 ECHO EF 20-25%, diffuse hypokinesis, mild LVH, bioprosthetic aortic valve functioning normally, bioprosthetic mitral valve with mild central MR and mean gradient 4 mmHg.   Labs 04/09/2015: K 3.4 Creatinine 2.01  04/16/2915: K 4.2 Creatinine 1.8 Hgb 10.3  06/11/2015: K 4.1 Creatinine 1.9 07/24/15: K 4.5 Creatinine 1.8 WBC 7.2 08/21/2015: K 3.9 Creatinine 1.7   10/22/15 K 2.8, Creatinine 1.82 12/31/15 K 3.2, Creatinine 2.13 01/10/16 K 3.8, Creatinine 2.28 01/14/16 K 3.2, Creatinine 2.00  ROS:  Negative except for as mentioned in HPI and problem list.  Past Medical History  Diagnosis Date  . Peripheral artery disease (HCC)   . Hyperlipidemia        . Arthritis   . Aortic stenosis      a. s/p AVR  . BUNDLE BRANCH BLOCK, LEFT    . CAROTID  ARTERY STENOSIS 01/05/2009  . CVA 03/14/2010  . PERIPHERAL VASCULAR DISEASE 07/30/2007  . Chronic combined systolic and diastolic CHF (congestive heart failure) (HCC)     a. RHC 09/2011 RA 8, RV 58/2/11; PA 54/22 (37) PCWP 20; F CO/CI 4.57/2.67 PVR 3.7; b. L main no sig dz, LAD luminal irreg; LCx lg Ramus with luminal irreg, small AV Lcx witout sig dz, RCA luminal irreg; severe mitral annular calcifications; AV calcified c. EF 45-50% (07/2013);  d. 11/2014 Echo:  EF 20-25%, diff HK, nl AV/MV, sev dil LA, mod TR/PR, PASP .  . Diabetes mellitus   . HTN (hypertension)   . Iron deficiency anemia 09/21/2011  . History of shingles   . Mitral regurgitation     a. s/p MVR  . S/P aortic valve replacement with bioprosthetic valve 06/15/2013    21 mm [facility] Ease bovine pericardial tissue valve  . S/P mitral valve replacement with bioprosthetic valve 06/15/2013    25 mm [facility] Mitral bovine pericardial tissue valve  . Fracture, fibula, shaft 01/30/2015    right     Current Outpatient Prescriptions  Medication Sig Dispense Refill  . amiodarone (PACERONE) 200 MG tablet Take 1 tablet (200 mg total) by mouth daily. 90 tablet 3  . apixaban (ELIQUIS) 2.5 MG TABS tablet Take 1 tablet (2.5 mg total) by mouth 2 (two) times daily. 180 tablet 3  . ferrous sulfate 325 (65 FE) MG tablet Take 1 tablet (325 mg total) by mouth 2 (two) times daily with a meal. 180 tablet 1  . furosemide (LASIX) 80 MG tablet Take 1 tablet (80 mg total) by mouth daily. HOLD FOR WEIGHT LESS THAN 125LBS 30 tablet 6  . hydrALAZINE (APRESOLINE) 10 MG tablet Take 10 mg by mouth 2 (two) times daily.    . Insulin Lispro (HUMALOG KWIKPEN) 200 UNIT/ML SOPN Inject 0-9 Units into the skin 2 (two) times daily.    . isosorbide mononitrate (IMDUR) 30 MG 24 hr tablet Take 0.5 tablets (15 mg total) by mouth daily. 45 tablet 6  . levothyroxine (SYNTHROID, LEVOTHROID) 75 MCG tablet Take 1 tablet (75 mcg total) by mouth daily. 90 tablet 1  . metolazone (ZAROXOLYN) 2.5 MG tablet Take 2.5 mg by mouth daily as needed (blood pressure). Reported on 12/13/2015    . milrinone (PRIMACOR) 20 MG/100ML SOLN infusion Inject 14.575 mcg/min into the vein continuous. 100 mL 0  . potassium chloride SA (K-DUR,KLOR-CON) 20 MEQ tablet Take 20-40 mEq by mouth 2 (two) times daily. Take 20 meq in the morning and 40 meq in the evening    . acetaminophen (TYLENOL) 500 MG tablet Take 500 mg by mouth every 6 (six) hours as  needed (pain).     Marland Kitchen dextromethorphan-guaiFENesin (ROBITUSSIN-DM) 10-100 MG/5ML liquid Take 10 mLs by mouth every 4 (four) hours as needed for cough.    . Insulin Glargine (TOUJEO SOLOSTAR) 300 UNIT/ML SOPN Inject 20 Units into the skin daily. (Patient not taking: Reported on 01/23/2016) 15 pen 3   No current facility-administered medications for this encounter.   Social History   Social History  . Marital Status: Divorced    Spouse Name: N/A  . Number of Children: N/A  . Years of Education: N/A   Social History Main Topics  . Smoking status: Never Smoker   . Smokeless tobacco: Never Used  . Alcohol Use: No  . Drug Use: No  . Sexual Activity: No   Other Topics Concern  . Not on file  Social History Narrative   Patient lives alone here in town   She continues to work, helping to bind and oversew books   She is divorced after 23 years of marriage   1 son- '61, minister; 1 dtr '55; 4 grandchildren   End of life care-provided packet on living well   Family History  Problem Relation Age of Onset  . Colon cancer Mother   . Diabetes Neg Hx   . Coronary artery disease Neg Hx     Filed Vitals:   01/23/16 1010  BP: 122/52  Pulse: 83  Weight: 127 lb 9.6 oz (57.879 kg)  SpO2: 97%     Wt Readings from Last 3 Encounters:  01/23/16 127 lb 9.6 oz (57.879 kg)  01/11/16 126 lb 12 oz (57.493 kg)  01/10/16 131 lb (59.421 kg)    PHYSICAL EXAM: General: Elderly. NAD; Daughter present. Ambulated into the clinic using cane without difficulty HEENT: Normal Neck: supple. JVP 8-9 cm with mild HJR. Carotids 2+ bilaterally; no bruits. No thyromegaly or nodule noted. Cor: PMI normal. RRR. 1/6 SEM. +S3.    Lungs: Course sounds throughout that clear slightly with cough.  Abdomen: soft, NT, ND, no HSM. No bruits or masses. +BS  Extremities: no cyanosis, clubbing, rash. RUE PICC. Scattered ecchymosis on bilateral LEs. 1+ ankle edema.  Neuro: alert & orientedx3, cranial nerves grossly intact.  Moves all 4 extremities w/o difficulty. Affect pleasant  ASSESSMENT & PLAN: 1. Chronic systolic CHF: EF 47-42% (4/16).  NICM.  On chronic milrinone 0.25 mcg. Continue bi-weekly lab work.  - NYHA IIIb on milrinone. PICC site OK. Recent dressing change - Volume status mildly elevated with holding lasix yesterday.  Took metolazone this morning.  - Continue lasix 80 mg daily. Take only 40 mg if weight < 125. Hold for weights less than 123. - Take metolazone as needed for weights of 3 lbs over night or 5 lbs in a week.  Make sure she takes with extra 20 meq of potassium.  - Continue K 40 meq q am 20 meq q pm.  BMET today. - Continue hydralazine 10 mg BID and imdur 15 mg daily.  - No ARB/ACE with AKI and hypotension. 2. Carotid stenosis:  - Dopplers 12/2014 40-59% RICA stenosis stable. Repeat as needed 3. Hyperlipidemia:  - Statin d/c'd in June as non-essential med with new DNR/DNI status. - Last lipid check 12/2014. Stable. 4.  Aortic stenosis/mitral regurgitation:  - s/p bioprosthetic AVR/MVR stable last echo 01/2015.  5. CKD stage 4 - Stable 6. PAF:   - in NSR on exam.  - Continue apixaban 2.5 mg BID and amio 200 mg daily.  - No bleeding on Eliquis. 7. St Jude CRT-D  - Sees EP 8. DM, Insulin dependent - Per PCP.  9. Productive cough - Seen by PCP. Symptomatic treatment.   Adjusted sliding scale diuretics as above.  BMET today. Follow up 2 months.   Graciella Freer, PA-C 10:34 AM

## 2016-01-23 NOTE — Progress Notes (Signed)
Advanced Heart Failure Medication Review by a Pharmacist  Does the patient  feel that his/her medications are working for him/her?  yes  Has the patient been experiencing any side effects to the medications prescribed?  no  Does the patient measure his/her own blood pressure or blood glucose at home?  yes   Does the patient have any problems obtaining medications due to transportation or finances?   no  Understanding of regimen: excellent Understanding of indications: excellent Potential of compliance: excellent Patient understands to avoid NSAIDs. Patient understands to avoid decongestants.  Issues to address at subsequent visits: insulin mgmt   Pharmacist comments:  Joanna [name] is a very pleasant 82 yo woman here for f/u in HF clinic. Her daughter helps with medication administration and does a great job of maintaining her medication regimen. No missed doses of medications, except for skipping Lasix dose yesterday d/t lower wt on AM scale. Weight went back up this AM, so pt was given metolazone this AM as well.   Main concerns were related to glucose control. Joanna [name] presented to the ED on 3/27 for fall 2/2 symptomatic hypoglycemia and was instructed to stop taking Toujeo and continue with Humalog only. However, BG readings have been elevated especially in the evenings after stopping long-acting insulin. Pt and her daughter were concerned and had wanted to restart Toujeo at half dose (10 units qday). Will forward concerns to PCP, Dr. Yetta Barre for glucose mgmt.      Lastly, pt received letter from [facility] regarding inability to fill her upcoming potassium Rx. S/W [facility] who needed clarification on sig and ability to substitute products. Issue now resolved.   Joanna [name], [place].D., BCPS PGY2 Cardiology Pharmacy Resident Pager: [phone]   Time with patient: 10 min Preparation and documentation time: 20 min Total time: 30 min

## 2016-02-05 ENCOUNTER — Encounter: Payer: Self-pay | Admitting: Internal Medicine

## 2016-02-05 ENCOUNTER — Telehealth (HOSPITAL_COMMUNITY): Payer: Self-pay | Admitting: *Deleted

## 2016-02-05 NOTE — Telephone Encounter (Signed)
Danita, RN called to let us know pt's wt is up a little to 131 lb today, she is a little more SOB w/activity, occ dizziness, edema 1/2 way up legs.  Abd girth is also up, usually runs 78-80 and today it is up to 84.  She states pt daughter gave her only 1/2 of a 2.5 mg tab of Metolazone today, not really clear why she didn't give her the whole 2.5 mg.  Advised pt needs to take other 1/2 of tab and if symtoms and wt not improving she needs to call us back.  Danita, RN aware and will advise pt's daughter.

## 2016-02-08 ENCOUNTER — Other Ambulatory Visit (HOSPITAL_COMMUNITY): Payer: Self-pay | Admitting: Internal Medicine

## 2016-02-15 ENCOUNTER — Other Ambulatory Visit (HOSPITAL_COMMUNITY): Payer: Self-pay | Admitting: Internal Medicine

## 2016-02-27 ENCOUNTER — Ambulatory Visit (INDEPENDENT_AMBULATORY_CARE_PROVIDER_SITE_OTHER): Payer: Medicare Other | Admitting: Family

## 2016-02-27 ENCOUNTER — Encounter: Payer: Self-pay | Admitting: Family

## 2016-02-27 VITALS — BP 98/60 | HR 89 | Temp 98.7°F | Resp 12 | Ht 65.0 in | Wt 127.0 lb

## 2016-02-27 DIAGNOSIS — L0390 Cellulitis, unspecified: Secondary | ICD-10-CM

## 2016-02-27 DIAGNOSIS — I6523 Occlusion and stenosis of bilateral carotid arteries: Secondary | ICD-10-CM | POA: Diagnosis not present

## 2016-02-27 MED ORDER — DOXYCYCLINE HYCLATE 100 MG PO TBEC: 100.0000 mg | DELAYED_RELEASE_TABLET | Freq: Two times a day (BID) | ORAL | Status: DC

## 2016-02-27 NOTE — Patient Instructions (Addendum)
Please schedule a nurse visit for glucometer check in the next couple days as I'm having patient decrease her long-acting insulin, Toujeo.  Continue to be vigilant for signs of worsening infection including redness, red streaks, calf swelling. Keep wound clean, dry.  If there is no improvement in your symptoms, or if there is any worsening of symptoms, or if you have any additional concerns, please return for re-evaluation; or, if we are closed, consider going to the Emergency Room for evaluation if symptoms urgent.

## 2016-02-27 NOTE — Progress Notes (Signed)
Subjective:    Patient ID: Joanna [name], female    DOB: 1930-08-14, 82 y.o.   MRN: [id number]   Joanna [name] is a 82 y.o. female who presents today for an acute visit.    HPI Comments:   Patient here for evaluation of wound posterior right leg for past 2 weeks. States it is unchanged. History of diabetes and CHF with chronic BLE edema. Blood sugars have been running 198- 500. Hypoglycemic episode  And passed out on Toujeo ( prescribed 20 units at night). Daughter has stopped it now. Daughter will splurge on high carb meal.  On Eliquis. No h/o DVT.   Past Medical History  Diagnosis Date  . Peripheral artery disease (HCC)   . Hyperlipidemia        . Arthritis   . Aortic stenosis      a. s/p AVR  . BUNDLE BRANCH BLOCK, LEFT    . CAROTID ARTERY STENOSIS 01/05/2009  . CVA 03/14/2010  . PERIPHERAL VASCULAR DISEASE 07/30/2007  . Chronic combined systolic and diastolic CHF (congestive heart failure) (HCC)     a. RHC 09/2011 RA 8, RV 58/2/11; PA 54/22 (37) PCWP 20; F CO/CI 4.57/2.67 PVR 3.7; b. L main no sig dz, LAD luminal irreg; LCx lg Ramus with luminal irreg, small AV Lcx witout sig dz, RCA luminal irreg; severe mitral annular calcifications; AV calcified c. EF 45-50% (07/2013);  d. 11/2014 Echo: EF 20-25%, diff HK, nl AV/MV, sev dil LA, mod TR/PR, PASP .  . Diabetes mellitus   . HTN (hypertension)   . Iron deficiency anemia 09/21/2011  . History of shingles   . Mitral regurgitation     a. s/p MVR  . S/P aortic valve replacement with bioprosthetic valve 06/15/2013    21 mm [facility] Ease bovine pericardial tissue valve  . S/P mitral valve replacement with bioprosthetic valve 06/15/2013    25 mm [facility] Mitral bovine pericardial tissue valve  . Fracture, fibula, shaft 01/30/2015    right   Allergies: Other and Prednisone Current Outpatient Prescriptions on File Prior to Visit  Medication Sig Dispense Refill  . acetaminophen (TYLENOL) 500 MG tablet Take 500 mg by  mouth every 6 (six) hours as needed (pain).     Marland Kitchen amiodarone (PACERONE) 200 MG tablet Take 1 tablet (200 mg total) by mouth daily. 90 tablet 3  . apixaban (ELIQUIS) 2.5 MG TABS tablet Take 1 tablet (2.5 mg total) by mouth 2 (two) times daily. 180 tablet 3  . ferrous sulfate 325 (65 FE) MG tablet Take 1 tablet (325 mg total) by mouth 2 (two) times daily with a meal. 180 tablet 1  . furosemide (LASIX) 80 MG tablet Take 1 tablet (80 mg total) by mouth daily. HOLD FOR WEIGHT LESS THAN 125LBS 30 tablet 6  . hydrALAZINE (APRESOLINE) 10 MG tablet Take 10 mg by mouth 2 (two) times daily.    . Insulin Lispro (HUMALOG KWIKPEN) 200 UNIT/ML SOPN Inject 0-9 Units into the skin 2 (two) times daily.    . isosorbide mononitrate (IMDUR) 30 MG 24 hr tablet Take 0.5 tablets (15 mg total) by mouth daily. 45 tablet 6  . levothyroxine (SYNTHROID, LEVOTHROID) 75 MCG tablet Take 1 tablet (75 mcg total) by mouth daily. 90 tablet 1  . metolazone (ZAROXOLYN) 2.5 MG tablet Take 2.5 mg by mouth daily as needed (blood pressure). Reported on 12/13/2015    . milrinone (PRIMACOR) 20 MG/100ML SOLN infusion Inject 14.575 mcg/min into the vein continuous.  100 mL 0  . potassium chloride SA (K-DUR,KLOR-CON) 20 MEQ tablet Take 20-40 mEq by mouth 2 (two) times daily. Take 20 meq in the morning and 40 meq in the evening    . dextromethorphan-guaiFENesin (ROBITUSSIN-DM) 10-100 MG/5ML liquid Take 10 mLs by mouth every 4 (four) hours as needed for cough. Reported on 02/27/2016    . Insulin Glargine (TOUJEO SOLOSTAR) 300 UNIT/ML SOPN Inject 20 Units into the skin daily. (Patient not taking: Reported on 01/23/2016) 15 pen 3   No current facility-administered medications on file prior to visit.    Social History  Substance Use Topics  . Smoking status: Never Smoker   . Smokeless tobacco: Never Used  . Alcohol Use: No    Review of Systems  Constitutional: Negative for fever and chills.  Respiratory: Negative for cough and shortness of  breath.   Cardiovascular: Negative for chest pain and palpitations.  Gastrointestinal: Negative for nausea and vomiting.  Skin: Positive for wound. Negative for rash.      Objective:    BP 98/60 mmHg  Pulse 89  Temp(Src) 98.7 F (37.1 C) (Oral)  Resp 12  Ht  (1.651 m)  Wt 127 lb (57.607 kg)  BMI 21.13 kg/m2  SpO2 98%   Physical Exam  Constitutional: She appears well-developed and well-nourished.  Eyes: Conjunctivae are normal.  Cardiovascular: Normal rate, regular rhythm, normal heart sounds and normal pulses.   +2 pitting LE edema. No palpable cords or masses. No increased warmth of calves. Negative Homan sign bilaterally.  LE hair growth diminished.  Varicosities noted. LE warm and palpable pedal pulses.   Pulmonary/Chest: Effort normal and breath sounds normal. She has no wheezes. She has no rhonchi. She has no rales.  Neurological: She is alert.  Skin: Skin is warm and dry.     Circular approximately 2-3 cm ulcer left posterior leg. Mild surrounding erythema. No streaking. Mild tenderness. Eschar in center of wound. No discharge.  Psychiatric: She has a normal mood and affect. Her speech is normal and behavior is normal. Thought content normal.  Vitals reviewed.      Assessment & Plan:   1. Wound cellulitis v ulcer Poor wound healing,  due to poorly controlled diabetes. Wound is circular and may also be an arterial ulcer ( DM ulcer). However with erythema, mild tenderness which is present, I'm covering patient with antibiotic. Due to her renal function, doxycycline is the safest agent.  -referral to wound clinic - doxycycline (DORYX) 100 MG EC tablet; Take 1 tablet (100 mg total) by mouth 2 (two) times daily.  Dispense: 20 tablet; Refill: 0  A1c 8.5 one month ago which is when Toujeo was started. Daughter, patient, and I agreed to decrease the dose to 5 units at bedtime. We will have patient come back in for nurse visit to see how this regimen is going in the  next couple days.   I am having Joanna [name] maintain her ferrous sulfate, acetaminophen, apixaban, hydrALAZINE, amiodarone, isosorbide mononitrate, milrinone, Insulin Lispro, furosemide, metolazone, levothyroxine, Insulin Glargine, dextromethorphan-guaiFENesin, and potassium chloride SA.   No orders of the defined types were placed in this encounter.     Start medications as prescribed and explained to patient on After Visit Summary ( AVS). Risks, benefits, and alternatives of the medications and treatment plan prescribed today were discussed, and patient expressed understanding.   Education regarding symptom management and diagnosis given to patient.   Follow-up:Plan follow-up as discussed or as needed if any worsening symptoms  or change in condition.   Continue to follow with Sanda Linger, MD for routine health maintenance.   Paz H Rotunno and I agreed with plan.   Rennie Plowman, FNP

## 2016-02-27 NOTE — Progress Notes (Signed)
Pre visit review using our clinic review tool, if applicable. No additional management support is needed unless otherwise documented below in the visit note. 

## 2016-03-03 ENCOUNTER — Ambulatory Visit: Payer: Medicare Other

## 2016-03-03 DIAGNOSIS — I10 Essential (primary) hypertension: Secondary | ICD-10-CM

## 2016-03-04 ENCOUNTER — Telehealth (HOSPITAL_COMMUNITY): Payer: Self-pay | Admitting: *Deleted

## 2016-03-04 NOTE — Telephone Encounter (Signed)
Received VM from Danita, RN with several needs   Order for CSW, pt's daughter would like help with community resources  Re-cert orders  Level 2 interaction with Doxy and Iron  Returned call to [employer], gave VO for CSW and re-cert orders.  Per Otilio Saber, PA Doxy and Iron ok as long as she is not taking them at the same time, per Danita pt is not taking them together.

## 2016-03-10 ENCOUNTER — Other Ambulatory Visit (HOSPITAL_COMMUNITY): Payer: Self-pay | Admitting: Internal Medicine

## 2016-03-10 ENCOUNTER — Telehealth: Payer: Self-pay | Admitting: Internal Medicine

## 2016-03-10 NOTE — Telephone Encounter (Signed)
Patient has wound on lower leg.  States she was placed on antibiotic.  Is requesting to add allevyn foam to area.

## 2016-03-10 NOTE — Telephone Encounter (Signed)
Ok with me 

## 2016-03-10 NOTE — Telephone Encounter (Signed)
Saw M Arnette on 5/10. Please Advise

## 2016-03-11 NOTE — Telephone Encounter (Signed)
Called donita no answer LMOM w/MD response...Raechel Chute

## 2016-03-18 ENCOUNTER — Encounter: Payer: Self-pay | Admitting: Internal Medicine

## 2016-03-18 ENCOUNTER — Ambulatory Visit (INDEPENDENT_AMBULATORY_CARE_PROVIDER_SITE_OTHER): Payer: Medicare Other | Admitting: Internal Medicine

## 2016-03-18 VITALS — BP 120/60 | HR 77 | Ht 65.0 in | Wt 125.6 lb

## 2016-03-18 DIAGNOSIS — I6523 Occlusion and stenosis of bilateral carotid arteries: Secondary | ICD-10-CM

## 2016-03-18 DIAGNOSIS — I480 Paroxysmal atrial fibrillation: Secondary | ICD-10-CM | POA: Diagnosis not present

## 2016-03-18 DIAGNOSIS — I5022 Chronic systolic (congestive) heart failure: Secondary | ICD-10-CM | POA: Diagnosis not present

## 2016-03-18 DIAGNOSIS — Z950 Presence of cardiac pacemaker: Secondary | ICD-10-CM

## 2016-03-18 DIAGNOSIS — I429 Cardiomyopathy, unspecified: Secondary | ICD-10-CM

## 2016-03-18 DIAGNOSIS — I428 Other cardiomyopathies: Secondary | ICD-10-CM

## 2016-03-18 NOTE — Patient Instructions (Addendum)
Medication Instructions: - Your physician recommends that you continue on your current medications as directed. Please refer to the Current Medication list given to you today.  Labwork: - none  Procedures/Testing: - none  Follow-Up: - Remote monitoring is used to monitor your Pacemaker of ICD from home. This monitoring reduces the number of office visits required to check your device to one time per year. It allows us to keep an eye on the functioning of your device to ensure it is working properly. You are scheduled for a device check from home on 06/17/16. You may send your transmission at any time that day. If you have a wireless device, the transmission will be sent automatically. After your physician reviews your transmission, you will receive a postcard with your next transmission date.  - Your physician wants you to follow-up in: 1 year with Dr. Klein. You will receive a reminder letter in the mail two months in advance. If you don't receive a letter, please call our office to schedule the follow-up appointment.  Any Additional Special Instructions Will Be Listed Below (If Applicable).     If you need a refill on your cardiac medications before your next appointment, please call your pharmacy.   

## 2016-03-18 NOTE — Progress Notes (Signed)
Patient Care Team: Etta Grandchild, MD as PCP - General (Internal Medicine) Laurey Morale, MD as Consulting Physician (Cardiology) Dolores Patty, MD as Consulting Physician (Cardiology) Purcell Nails, MD as Consulting Physician (Cardiothoracic Surgery)   HPI  Joanna [name] is a 82 y.o. female Seen in follow-up for CRT-D implantation 6/16 utilizing a previously implanted epicardial LV lead.  She has a history of aortic stenosis and underwent AVR/MVR area she has nonischemic cardiomyopathy and left bundle branch block. Ejection fraction 4/16 was 20-25%    Because of the lack of responsiveness, she was put on milrinone and remains on it to this point.  She has had multiple hospitalizations over the last year including for falls, sepsis, hip fracture, hypoglycemia.  Dyspnea is modest. There is trace edema.  The patient says that she is significantly improved following CRT; her daughter reminds me that she is significant only short of breath with any activity  Records and Results Reviewed heart failure records seeen last 4/5  Past Medical History  Diagnosis Date  . Peripheral artery disease (HCC)   . Hyperlipidemia        . Arthritis   . Aortic stenosis      a. s/p AVR  . BUNDLE BRANCH BLOCK, LEFT    . CAROTID ARTERY STENOSIS 01/05/2009  . CVA 03/14/2010  . PERIPHERAL VASCULAR DISEASE 07/30/2007  . Chronic combined systolic and diastolic CHF (congestive heart failure) (HCC)     a. RHC 09/2011 RA 8, RV 58/2/11; PA 54/22 (37) PCWP 20; F CO/CI 4.57/2.67 PVR 3.7; b. L main no sig dz, LAD luminal irreg; LCx lg Ramus with luminal irreg, small AV Lcx witout sig dz, RCA luminal irreg; severe mitral annular calcifications; AV calcified c. EF 45-50% (07/2013);  d. 11/2014 Echo: EF 20-25%, diff HK, nl AV/MV, sev dil LA, mod TR/PR, PASP .  . Diabetes mellitus   . HTN (hypertension)   . Iron deficiency anemia 09/21/2011  . History of shingles   . Mitral regurgitation    a. s/p MVR  . S/P aortic valve replacement with bioprosthetic valve 06/15/2013    21 mm [facility] Ease bovine pericardial tissue valve  . S/P mitral valve replacement with bioprosthetic valve 06/15/2013    25 mm [facility] Mitral bovine pericardial tissue valve  . Fracture, fibula, shaft 01/30/2015    right    Past Surgical History  Procedure Laterality Date  . Carotid endarterectomy Left 2011  . Polypectomy  12/27/04  . Cataract extraction Bilateral 2009  . Tee without cardioversion N/A 05/19/2013    Procedure: TRANSESOPHAGEAL ECHOCARDIOGRAM (TEE);  Surgeon: Laurey Morale, MD;  Location: [facility] ENDOSCOPY;  Service: Cardiovascular;  Laterality: N/A;  . Colonoscopy    . Cardiac catheterization  2012  . Aortic valve replacement N/A 06/15/2013    Procedure: AORTIC VALVE REPLACEMENT (AVR);  Surgeon: Purcell Nails, MD;  Location[facility] OR;  Service: Open Heart Surgery;  Laterality: N/A;  . Intraoperative transesophageal echocardiogram N/A 06/15/2013    Procedure: INTRAOPERATIVE TRANSESOPHAGEAL ECHOCARDIOGRAM;  Surgeon: Purcell Nails, MD;  Location[facility] OR;  Service: Open Heart Surgery;  Laterality: N/A;  . Mitral valve replacement N/A 06/15/2013    Procedure: MITRAL VALVE (MV) REPLACEMENT;  Surgeon: Purcell Nails, MD;  Location: [facility] OR;  Service: Open Heart Surgery;  Laterality: N/A;  . Epicardial pacing lead placement N/A 06/15/2013    Procedure: EPICARDIAL PACING LEAD PLACEMENT;  Surgeon: Purcell Nails, MD;  Location:  [facility] OR;  Service: Open Heart Surgery;  Laterality: N/A;  . Left and right heart catheterization with coronary angiogram N/A 09/19/2011    Procedure: LEFT AND RIGHT HEART CATHETERIZATION WITH CORONARY ANGIOGRAM;  Surgeon: Dolores Patty, MD;  Location: [facility] CATH LAB;  Service: Cardiovascular;  Laterality: N/A;  . Orif ankle fracture Right 01/31/2015    Procedure: OPEN REDUCTION INTERNAL FIXATION (ORIF) ANKLE FRACTURE;  Surgeon: Eldred Manges, MD;  Location: [facility] OR;  Service:  Orthopedics;  Laterality: Right;  . Ep implantable device N/A 03/21/2015    Procedure: BiV Pacemaker Insertion CRT-P;  Surgeon: Duke Salvia, MD;  Location: [facility] INVASIVE CV LAB;  Service: Cardiovascular;  Laterality: N/A;  . Pacemaker insertion      Current Outpatient Prescriptions  Medication Sig Dispense Refill  . acetaminophen (TYLENOL) 500 MG tablet Take 500 mg by mouth every 6 (six) hours as needed (pain).     Marland Kitchen amiodarone (PACERONE) 200 MG tablet Take 1 tablet (200 mg total) by mouth daily. 90 tablet 3  . apixaban (ELIQUIS) 2.5 MG TABS tablet Take 1 tablet (2.5 mg total) by mouth 2 (two) times daily. 180 tablet 3  . dextromethorphan-guaiFENesin (ROBITUSSIN-DM) 10-100 MG/5ML liquid Take 10 mLs by mouth every 4 (four) hours as needed for cough. Reported on 02/27/2016    . ferrous sulfate 325 (65 FE) MG tablet Take 1 tablet (325 mg total) by mouth 2 (two) times daily with a meal. 180 tablet 1  . furosemide (LASIX) 80 MG tablet Take 1 tablet (80 mg total) by mouth daily. HOLD FOR WEIGHT LESS THAN 125LBS 30 tablet 6  . hydrALAZINE (APRESOLINE) 10 MG tablet Take 10 mg by mouth 2 (two) times daily.    . Insulin Glargine (TOUJEO SOLOSTAR) 300 UNIT/ML SOPN Inject 10 units or less into the skin at night    . Insulin Lispro (HUMALOG KWIKPEN) 200 UNIT/ML SOPN Inject 0-9 Units into the skin 2 (two) times daily.    . isosorbide mononitrate (IMDUR) 30 MG 24 hr tablet Take 0.5 tablets (15 mg total) by mouth daily. 45 tablet 6  . levothyroxine (SYNTHROID, LEVOTHROID) 75 MCG tablet Take 1 tablet (75 mcg total) by mouth daily. 90 tablet 1  . metolazone (ZAROXOLYN) 2.5 MG tablet Take 2.5 mg by mouth daily as needed (blood pressure). Reported on 12/13/2015    . milrinone (PRIMACOR) 20 MG/100ML SOLN infusion Inject 14.575 mcg/min into the vein continuous. 100 mL 0  . potassium chloride SA (K-DUR,KLOR-CON) 20 MEQ tablet Take 20-40 mEq by mouth 2 (two) times daily. Take 20 meq in the morning and 40 meq in the  evening     No current facility-administered medications for this visit.    Allergies  Allergen Reactions  . Other Hives    STEROIDS  . Prednisone Hives and Other (See Comments)    She is allergic to all steroids!      Review of Systems negative except from HPI and PMH  Physical Exam BP 120/60 mmHg  Pulse 77  Ht  (1.651 m)  Wt 125 lb 9.6 oz (56.972 kg)  BMI 20.90 kg/m2 Well developed and well nourished in no acute distress HENT normal E scleral and icterus clear Neck Supple JVP flat; carotids brisk and full Clear to ausculation  Device pocket well healed; without hematoma or erythema.  There is no tethering *Regular rate and rhythm, no murmurs gallops or rub Soft with active bowel sounds No clubbing cyanosis 1+ Edema Alert and oriented, grossly normal motor  and sensory function Skin Warm and Dry  ECG demonstrates P cc pacing is 87 with an up right QRS in lead V1 and V2  Assessment and  Plan  Congestive heart failure  Nonischemic cardiomyopathy  Milrinone dependent    CRT-D-St. Jude's  She is functionally stable and euvolemic.  Device function was normal and was not appropriate

## 2016-03-19 ENCOUNTER — Telehealth (HOSPITAL_COMMUNITY): Payer: Self-pay | Admitting: Cardiology

## 2016-03-19 MED ORDER — POTASSIUM CHLORIDE CRYS ER 20 MEQ PO TBCR: 40.0000 meq | EXTENDED_RELEASE_TABLET | Freq: Two times a day (BID) | ORAL | Status: DC

## 2016-03-19 NOTE — Telephone Encounter (Signed)
Abnormal labs k 3.3 Per VO Joanna [name] Patient should increase potassium to 40 BID recheck x 1 week with [facility]  Patients daughter aware and voiced understanding

## 2016-03-20 ENCOUNTER — Encounter (HOSPITAL_BASED_OUTPATIENT_CLINIC_OR_DEPARTMENT_OTHER): Payer: Medicare Other | Attending: Internal Medicine

## 2016-03-20 DIAGNOSIS — I499 Cardiac arrhythmia, unspecified: Secondary | ICD-10-CM | POA: Insufficient documentation

## 2016-03-20 DIAGNOSIS — L03116 Cellulitis of left lower limb: Secondary | ICD-10-CM | POA: Diagnosis not present

## 2016-03-20 DIAGNOSIS — Z952 Presence of prosthetic heart valve: Secondary | ICD-10-CM | POA: Diagnosis not present

## 2016-03-20 DIAGNOSIS — Z794 Long term (current) use of insulin: Secondary | ICD-10-CM | POA: Diagnosis not present

## 2016-03-20 DIAGNOSIS — I429 Cardiomyopathy, unspecified: Secondary | ICD-10-CM | POA: Diagnosis not present

## 2016-03-20 DIAGNOSIS — I110 Hypertensive heart disease with heart failure: Secondary | ICD-10-CM | POA: Diagnosis not present

## 2016-03-20 DIAGNOSIS — I509 Heart failure, unspecified: Secondary | ICD-10-CM | POA: Insufficient documentation

## 2016-03-20 DIAGNOSIS — L97223 Non-pressure chronic ulcer of left calf with necrosis of muscle: Secondary | ICD-10-CM | POA: Diagnosis not present

## 2016-03-20 DIAGNOSIS — E11622 Type 2 diabetes mellitus with other skin ulcer: Secondary | ICD-10-CM | POA: Insufficient documentation

## 2016-03-20 DIAGNOSIS — E1151 Type 2 diabetes mellitus with diabetic peripheral angiopathy without gangrene: Secondary | ICD-10-CM | POA: Diagnosis not present

## 2016-03-24 ENCOUNTER — Other Ambulatory Visit (HOSPITAL_COMMUNITY): Payer: Self-pay | Admitting: Internal Medicine

## 2016-03-24 ENCOUNTER — Encounter (HOSPITAL_COMMUNITY): Payer: Self-pay | Admitting: Emergency Medicine

## 2016-03-24 ENCOUNTER — Inpatient Hospital Stay (HOSPITAL_COMMUNITY)
Admission: EM | Admit: 2016-03-24 | Discharge: 2016-03-27 | DRG: 603 | Disposition: A | Discharge status: Home / Self Care | Payer: Medicare Other | Attending: Internal Medicine | Admitting: Internal Medicine

## 2016-03-24 DIAGNOSIS — N184 Chronic kidney disease, stage 4 (severe): Secondary | ICD-10-CM | POA: Diagnosis not present

## 2016-03-24 DIAGNOSIS — Z800 Family history of malignant neoplasm of digestive organs: Secondary | ICD-10-CM

## 2016-03-24 DIAGNOSIS — Z452 Encounter for adjustment and management of vascular access device: Secondary | ICD-10-CM

## 2016-03-24 DIAGNOSIS — L03116 Cellulitis of left lower limb: Secondary | ICD-10-CM | POA: Diagnosis not present

## 2016-03-24 DIAGNOSIS — I5022 Chronic systolic (congestive) heart failure: Secondary | ICD-10-CM | POA: Diagnosis present

## 2016-03-24 DIAGNOSIS — E1151 Type 2 diabetes mellitus with diabetic peripheral angiopathy without gangrene: Secondary | ICD-10-CM | POA: Diagnosis present

## 2016-03-24 DIAGNOSIS — Z888 Allergy status to other drugs, medicaments and biological substances status: Secondary | ICD-10-CM

## 2016-03-24 DIAGNOSIS — I10 Essential (primary) hypertension: Secondary | ICD-10-CM | POA: Diagnosis present

## 2016-03-24 DIAGNOSIS — Z8673 Personal history of transient ischemic attack (TIA), and cerebral infarction without residual deficits: Secondary | ICD-10-CM

## 2016-03-24 DIAGNOSIS — E0821 Diabetes mellitus due to underlying condition with diabetic nephropathy: Secondary | ICD-10-CM | POA: Diagnosis not present

## 2016-03-24 DIAGNOSIS — Z794 Long term (current) use of insulin: Secondary | ICD-10-CM

## 2016-03-24 DIAGNOSIS — I480 Paroxysmal atrial fibrillation: Secondary | ICD-10-CM | POA: Diagnosis present

## 2016-03-24 DIAGNOSIS — L0390 Cellulitis, unspecified: Secondary | ICD-10-CM | POA: Diagnosis present

## 2016-03-24 DIAGNOSIS — Z7901 Long term (current) use of anticoagulants: Secondary | ICD-10-CM

## 2016-03-24 DIAGNOSIS — E876 Hypokalemia: Secondary | ICD-10-CM | POA: Diagnosis present

## 2016-03-24 DIAGNOSIS — I130 Hypertensive heart and chronic kidney disease with heart failure and stage 1 through stage 4 chronic kidney disease, or unspecified chronic kidney disease: Secondary | ICD-10-CM | POA: Diagnosis present

## 2016-03-24 DIAGNOSIS — E1129 Type 2 diabetes mellitus with other diabetic kidney complication: Secondary | ICD-10-CM | POA: Diagnosis present

## 2016-03-24 DIAGNOSIS — Z79899 Other long term (current) drug therapy: Secondary | ICD-10-CM

## 2016-03-24 DIAGNOSIS — I5042 Chronic combined systolic (congestive) and diastolic (congestive) heart failure: Secondary | ICD-10-CM | POA: Diagnosis present

## 2016-03-24 DIAGNOSIS — Z952 Presence of prosthetic heart valve: Secondary | ICD-10-CM

## 2016-03-24 DIAGNOSIS — E785 Hyperlipidemia, unspecified: Secondary | ICD-10-CM | POA: Diagnosis present

## 2016-03-24 DIAGNOSIS — E1122 Type 2 diabetes mellitus with diabetic chronic kidney disease: Secondary | ICD-10-CM | POA: Diagnosis present

## 2016-03-24 LAB — CBC WITH DIFFERENTIAL/PLATELET
BASOS PCT: 0 %
Basophils Absolute: 0 10*3/uL (ref 0.0–0.1)
EOS ABS: 0.1 10*3/uL (ref 0.0–0.7)
Eosinophils Relative: 1 %
HCT: 38.3 % (ref 36.0–46.0)
Hemoglobin: 12.7 g/dL (ref 12.0–15.0)
LYMPHS ABS: 1 10*3/uL (ref 0.7–4.0)
Lymphocytes Relative: 9 %
MCH: 30.5 pg (ref 26.0–34.0)
MCHC: 33.2 g/dL (ref 30.0–36.0)
MCV: 91.8 fL (ref 78.0–100.0)
MONO ABS: 0.7 10*3/uL (ref 0.1–1.0)
MONOS PCT: 6 %
Neutro Abs: 9.7 10*3/uL — ABNORMAL HIGH (ref 1.7–7.7)
Neutrophils Relative %: 84 %
Platelets: 202 10*3/uL (ref 150–400)
RBC: 4.17 MIL/uL (ref 3.87–5.11)
RDW: 15 % (ref 11.5–15.5)
WBC: 11.5 10*3/uL — ABNORMAL HIGH (ref 4.0–10.5)

## 2016-03-24 LAB — COMPREHENSIVE METABOLIC PANEL
ALBUMIN: 4.1 g/dL (ref 3.5–5.0)
ALK PHOS: 75 U/L (ref 38–126)
ALT: 46 U/L (ref 14–54)
ANION GAP: 10 (ref 5–15)
AST: 35 U/L (ref 15–41)
BILIRUBIN TOTAL: 1.3 mg/dL — AB (ref 0.3–1.2)
BUN: 84 mg/dL — ABNORMAL HIGH (ref 6–20)
CALCIUM: 9.7 mg/dL (ref 8.9–10.3)
CO2: 28 mmol/L (ref 22–32)
Chloride: 98 mmol/L — ABNORMAL LOW (ref 101–111)
Creatinine, Ser: 2.1 mg/dL — ABNORMAL HIGH (ref 0.44–1.00)
GFR calc non Af Amer: 21 mL/min — ABNORMAL LOW (ref >60)
GFR, EST AFRICAN AMERICAN: 24 mL/min — AB (ref >60)
GLUCOSE: 266 mg/dL — AB (ref 65–99)
POTASSIUM: 3.4 mmol/L — AB (ref 3.5–5.1)
SODIUM: 136 mmol/L (ref 135–145)
TOTAL PROTEIN: 7 g/dL (ref 6.5–8.1)

## 2016-03-24 LAB — LACTIC ACID, PLASMA: Lactic Acid, Venous: 1.1 mmol/L (ref 0.5–2.0)

## 2016-03-24 MED ORDER — MILRINONE IN DEXTROSE 20 MG/100ML IV SOLN
0.2500 ug/kg/min | INTRAVENOUS | Status: DC
  Administered 2016-03-25 – 2016-03-26 (×3): 0.25 ug/kg/min via INTRAVENOUS
  Filled 2016-03-24 (×4): qty 1

## 2016-03-24 MED ORDER — AMIODARONE HCL 200 MG PO TABS
200.0000 mg | ORAL_TABLET | Freq: Every day | ORAL | Status: DC
  Administered 2016-03-25 – 2016-03-27 (×3): 200 mg via ORAL
  Filled 2016-03-24 (×3): qty 1

## 2016-03-24 MED ORDER — HYDROCODONE-ACETAMINOPHEN 5-325 MG PO TABS: 1.0000 | ORAL_TABLET | ORAL | Status: DC | PRN

## 2016-03-24 MED ORDER — GUAIFENESIN-DM 100-10 MG/5ML PO SYRP: 10.0000 mL | ORAL_SOLUTION | ORAL | Status: DC | PRN

## 2016-03-24 MED ORDER — ONDANSETRON HCL 4 MG PO TABS: 4.0000 mg | ORAL_TABLET | Freq: Four times a day (QID) | ORAL | Status: DC | PRN

## 2016-03-24 MED ORDER — LEVOTHYROXINE SODIUM 75 MCG PO TABS
75.0000 ug | ORAL_TABLET | Freq: Every day | ORAL | Status: DC
  Administered 2016-03-25 – 2016-03-27 (×3): 75 ug via ORAL
  Filled 2016-03-24: qty 3
  Filled 2016-03-24: qty 1
  Filled 2016-03-24: qty 3
  Filled 2016-03-24 (×3): qty 1

## 2016-03-24 MED ORDER — HYDRALAZINE HCL 10 MG PO TABS
10.0000 mg | ORAL_TABLET | Freq: Two times a day (BID) | ORAL | Status: DC
  Administered 2016-03-25 – 2016-03-27 (×4): 10 mg via ORAL
  Filled 2016-03-24 (×6): qty 1

## 2016-03-24 MED ORDER — ACETAMINOPHEN 500 MG PO TABS
500.0000 mg | ORAL_TABLET | Freq: Four times a day (QID) | ORAL | Status: DC | PRN
  Administered 2016-03-25 – 2016-03-27 (×6): 500 mg via ORAL
  Filled 2016-03-24 (×6): qty 1

## 2016-03-24 MED ORDER — APIXABAN 2.5 MG PO TABS
2.5000 mg | ORAL_TABLET | Freq: Two times a day (BID) | ORAL | Status: DC
  Administered 2016-03-25 – 2016-03-27 (×6): 2.5 mg via ORAL
  Filled 2016-03-24 (×6): qty 1

## 2016-03-24 MED ORDER — SODIUM CHLORIDE 0.9% FLUSH: 3.0000 mL | INTRAVENOUS | Status: DC | PRN

## 2016-03-24 MED ORDER — POLYETHYLENE GLYCOL 3350 17 G PO PACK: 17.0000 g | PACK | Freq: Every day | ORAL | Status: DC | PRN

## 2016-03-24 MED ORDER — SODIUM CHLORIDE 0.9 % IV SOLN: 250.0000 mL | INTRAVENOUS | Status: DC | PRN

## 2016-03-24 MED ORDER — BISACODYL 5 MG PO TBEC: 5.0000 mg | DELAYED_RELEASE_TABLET | Freq: Every day | ORAL | Status: DC | PRN

## 2016-03-24 MED ORDER — INSULIN ASPART 100 UNIT/ML ~~LOC~~ SOLN
0.0000 [IU] | Freq: Three times a day (TID) | SUBCUTANEOUS | Status: DC
  Administered 2016-03-25: 3 [IU] via SUBCUTANEOUS
  Administered 2016-03-25: 2 [IU] via SUBCUTANEOUS
  Administered 2016-03-25: 8 [IU] via SUBCUTANEOUS
  Administered 2016-03-26: 2 [IU] via SUBCUTANEOUS
  Administered 2016-03-26: 3 [IU] via SUBCUTANEOUS
  Administered 2016-03-26: 5 [IU] via SUBCUTANEOUS
  Administered 2016-03-27 (×2): 3 [IU] via SUBCUTANEOUS

## 2016-03-24 MED ORDER — FERROUS SULFATE 325 (65 FE) MG PO TABS
325.0000 mg | ORAL_TABLET | Freq: Two times a day (BID) | ORAL | Status: DC
  Administered 2016-03-25 – 2016-03-27 (×5): 325 mg via ORAL
  Filled 2016-03-24 (×5): qty 1

## 2016-03-24 MED ORDER — VANCOMYCIN HCL 500 MG IV SOLR: 500.0000 mg | INTRAVENOUS | Status: DC

## 2016-03-24 MED ORDER — POTASSIUM CHLORIDE 10 MEQ/100ML IV SOLN
10.0000 meq | INTRAVENOUS | Status: AC
  Administered 2016-03-25 (×3): 10 meq via INTRAVENOUS
  Filled 2016-03-24 (×2): qty 100

## 2016-03-24 MED ORDER — VANCOMYCIN HCL IN DEXTROSE 1-5 GM/200ML-% IV SOLN
1000.0000 mg | Freq: Once | INTRAVENOUS | Status: AC
  Administered 2016-03-24: 1000 mg via INTRAVENOUS
  Filled 2016-03-24: qty 200

## 2016-03-24 MED ORDER — ONDANSETRON HCL 4 MG/2ML IJ SOLN: 4.0000 mg | Freq: Four times a day (QID) | INTRAMUSCULAR | Status: DC | PRN

## 2016-03-24 MED ORDER — DEXTROSE 5 % IV SOLN
1.0000 g | Freq: Every day | INTRAVENOUS | Status: DC
  Administered 2016-03-25 – 2016-03-26 (×3): 1 g via INTRAVENOUS
  Filled 2016-03-24 (×4): qty 10

## 2016-03-24 MED ORDER — SODIUM CHLORIDE 0.9% FLUSH
3.0000 mL | Freq: Two times a day (BID) | INTRAVENOUS | Status: DC
  Administered 2016-03-25: 3 mL via INTRAVENOUS

## 2016-03-24 MED ORDER — SODIUM CHLORIDE 0.9% FLUSH
3.0000 mL | Freq: Two times a day (BID) | INTRAVENOUS | Status: DC
  Administered 2016-03-25 – 2016-03-26 (×3): 3 mL via INTRAVENOUS

## 2016-03-24 MED ORDER — ISOSORBIDE MONONITRATE ER 30 MG PO TB24
15.0000 mg | ORAL_TABLET | Freq: Every day | ORAL | Status: DC
  Administered 2016-03-25 – 2016-03-27 (×3): 15 mg via ORAL
  Filled 2016-03-24 (×3): qty 1

## 2016-03-24 MED ORDER — FUROSEMIDE 40 MG PO TABS
80.0000 mg | ORAL_TABLET | Freq: Every day | ORAL | Status: DC
  Administered 2016-03-25 – 2016-03-27 (×3): 80 mg via ORAL
  Filled 2016-03-24 (×3): qty 2

## 2016-03-24 NOTE — ED Notes (Signed)
Nurse in the room starting a IV 

## 2016-03-24 NOTE — Progress Notes (Addendum)
Pharmacy Antibiotic Note  Joanna [name] is a 82 y.o. female admitted on 03/24/2016 with concern for cellulitis in left LE associated with non-healing wound x 2 months.  Pharmacy has been consulted for vancomycin dosing.  Plan: Vancomycin 1000 mg IV x1, then 500 mg IV q48 hr F/u SCr closely     Temp (24hrs), Avg:98.2 F (36.8 C), Min:98.1 F (36.7 C), Max:98.2 F (36.8 C)  No results for input(s): WBC, CREATININE, LATICACIDVEN, VANCOTROUGH, VANCOPEAK, VANCORANDOM, GENTTROUGH, GENTPEAK, GENTRANDOM, TOBRATROUGH, TOBRAPEAK, TOBRARND, AMIKACINPEAK, AMIKACINTROU, AMIKACIN in the last 168 hours.  CrCl cannot be calculated (Patient has no serum creatinine result on file.).   Updated SCr = 2.1; CrCl = 18.3 ml/min CG  Allergies  Allergen Reactions  . Other Hives    STEROIDS  . Prednisone Hives and Other (See Comments)    She is allergic to all steroids!    Antimicrobials this admission: Doxycycline PTA 5/10 >> 5/30 Vancomycin 6/5 >>   Dose adjustments this admission: ---  Microbiology results:   Thank you for allowing pharmacy to be a part of this patient's care.  Bernadene Person, PharmD, BCPS Pager: [phone] 03/24/2016, 8:58 PM

## 2016-03-24 NOTE — H&P (Signed)
History and Physical    Joanna [name]:[id number] DOB: 1930-04-26 DOA: 03/24/2016  PCP: Sanda Linger, MD   Patient coming from: Home   Chief Complaint: Redness and swelling of LLE  HPI: Joanna [name] is a 82 y.o. female with medical history significant for hypertension, diabetes, peripheral arterial disease, CK D stage IV, and chronic systolic CHF requiring milrinone infusion who presents to the ED with worsening erythema, edema, pain, and heat at the distal left lower extremity. Patient reportedly scratched the posterior aspect of the distal left lower extremity approximately 2 months ago and has had a small, nonhealing wound at that side ever since. She has been followed by wound care and there had been no concern for associated infection until approximately 4 days ago when she developed surrounding erythema, swelling, and tenderness. Patient was started on antibiotics 4 days ago with doxycycline, but there has been no appreciable improvement, prompting her visit to the ED. Patient denies any recent fevers or chills and denies any drainage from the affected leg.  ED Course: Upon arrival to the ED, patient is found to be afebrile, saturating well on room air, and with vital signs stable. Chemistry panel features a BUN of 84 and serum creatinine of 2.10, both apparently stable relative to her baseline. CBC features a leukocytosis to 11,500 and lactic acid is reassuring at 1.1. Empiric vancomycin was started in the emergency department. Patient has remained hemodynamically stable in the ED and will be admitted to the hospital for ongoing evaluation and management of left lower extremity cellulitis.  Review of Systems:  All other systems reviewed and apart from HPI, are negative.  Past Medical History  Diagnosis Date  . Peripheral artery disease (HCC)   . Hyperlipidemia        . Arthritis   . Aortic stenosis      a. s/p AVR  . BUNDLE BRANCH BLOCK, LEFT    . CAROTID ARTERY STENOSIS  01/05/2009  . CVA 03/14/2010  . PERIPHERAL VASCULAR DISEASE 07/30/2007  . Chronic combined systolic and diastolic CHF (congestive heart failure) (HCC)     a. RHC 09/2011 RA 8, RV 58/2/11; PA 54/22 (37) PCWP 20; F CO/CI 4.57/2.67 PVR 3.7; b. L main no sig dz, LAD luminal irreg; LCx lg Ramus with luminal irreg, small AV Lcx witout sig dz, RCA luminal irreg; severe mitral annular calcifications; AV calcified c. EF 45-50% (07/2013);  d. 11/2014 Echo: EF 20-25%, diff HK, nl AV/MV, sev dil LA, mod TR/PR, PASP .  . Diabetes mellitus   . HTN (hypertension)   . Iron deficiency anemia 09/21/2011  . History of shingles   . Mitral regurgitation     a. s/p MVR  . S/P aortic valve replacement with bioprosthetic valve 06/15/2013    21 mm [facility] Ease bovine pericardial tissue valve  . S/P mitral valve replacement with bioprosthetic valve 06/15/2013    25 mm [facility] Mitral bovine pericardial tissue valve  . Fracture, fibula, shaft 01/30/2015    right    Past Surgical History  Procedure Laterality Date  . Carotid endarterectomy Left 2011  . Polypectomy  12/27/04  . Cataract extraction Bilateral 2009  . Tee without cardioversion N/A 05/19/2013    Procedure: TRANSESOPHAGEAL ECHOCARDIOGRAM (TEE);  Surgeon: Laurey Morale, MD;  Location: [facility] ENDOSCOPY;  Service: Cardiovascular;  Laterality: N/A;  . Colonoscopy    . Cardiac catheterization  2012  . Aortic valve replacement N/A 06/15/2013    Procedure: AORTIC VALVE REPLACEMENT (AVR);  Surgeon: Purcell Nails, MD;  Location: [facility] OR;  Service: Open Heart Surgery;  Laterality: N/A;  . Intraoperative transesophageal echocardiogram N/A 06/15/2013    Procedure: INTRAOPERATIVE TRANSESOPHAGEAL ECHOCARDIOGRAM;  Surgeon: Purcell Nails, MD;  Location: [facility] OR;  Service: Open Heart Surgery;  Laterality: N/A;  . Mitral valve replacement N/A 06/15/2013    Procedure: MITRAL VALVE (MV) REPLACEMENT;  Surgeon: Purcell Nails, MD;  Location: [facility] OR;  Service: Open  Heart Surgery;  Laterality: N/A;  . Epicardial pacing lead placement N/A 06/15/2013    Procedure: EPICARDIAL PACING LEAD PLACEMENT;  Surgeon: Purcell Nails, MD;  Location: [facility] OR;  Service: Open Heart Surgery;  Laterality: N/A;  . Left and right heart catheterization with coronary angiogram N/A 09/19/2011    Procedure: LEFT AND RIGHT HEART CATHETERIZATION WITH CORONARY ANGIOGRAM;  Surgeon: Dolores Patty, MD;  Location: [facility] CATH LAB;  Service: Cardiovascular;  Laterality: N/A;  . Orif ankle fracture Right 01/31/2015    Procedure: OPEN REDUCTION INTERNAL FIXATION (ORIF) ANKLE FRACTURE;  Surgeon: Eldred Manges, MD;  Location: [facility] OR;  Service: Orthopedics;  Laterality: Right;  . Ep implantable device N/A 03/21/2015    Procedure: BiV Pacemaker Insertion CRT-P;  Surgeon: Duke Salvia, MD;  Location: [facility] INVASIVE CV LAB;  Service: Cardiovascular;  Laterality: N/A;  . Pacemaker insertion       reports that she has never smoked. She has never used smokeless tobacco. She reports that she does not drink alcohol or use illicit drugs.  Allergies  Allergen Reactions  . Other Hives    STEROIDS  . Prednisone Hives and Other (See Comments)    She is allergic to all steroids!    Family History  Problem Relation Age of Onset  . Colon cancer Mother   . Diabetes Neg Hx   . Coronary artery disease Neg Hx      Prior to Admission medications   Medication Sig Start Date End Date Taking? Authorizing Provider  acetaminophen (TYLENOL) 500 MG tablet Take 500 mg by mouth every 6 (six) hours as needed (pain).    Yes Historical Provider, MD  amiodarone (PACERONE) 200 MG tablet Take 1 tablet (200 mg total) by mouth daily. 06/28/15  Yes Dolores Patty, MD  apixaban (ELIQUIS) 2.5 MG TABS tablet Take 1 tablet (2.5 mg total) by mouth 2 (two) times daily. 06/21/15  Yes Laurey Morale, MD  ferrous sulfate 325 (65 FE) MG tablet Take 1 tablet (325 mg total) by mouth 2 (two) times daily with a meal. 02/28/14  Yes Etta Grandchild, MD  furosemide (LASIX) 80 MG tablet Take 1 tablet (80 mg total) by mouth daily. HOLD FOR WEIGHT LESS THAN 125LBS 11/14/15  Yes Dolores Patty, MD  hydrALAZINE (APRESOLINE) 10 MG tablet Take 10 mg by mouth 2 (two) times daily.   Yes Historical Provider, MD  Insulin Glargine (TOUJEO SOLOSTAR) 300 UNIT/ML SOPN Inject 7-10 Units into the skin at bedtime.    Yes Historical Provider, MD  Insulin Lispro (HUMALOG KWIKPEN) 200 UNIT/ML SOPN Inject 3 Units into the skin 3 (three) times daily as needed (high blood sugar).    Yes Historical Provider, MD  isosorbide mononitrate (IMDUR) 30 MG 24 hr tablet Take 0.5 tablets (15 mg total) by mouth daily. 08/14/15  Yes Dolores Patty, MD  levothyroxine (SYNTHROID, LEVOTHROID) 75 MCG tablet Take 1 tablet (75 mcg total) by mouth daily. 12/31/15  Yes Etta Grandchild, MD  milrinone [facility]) 20 MG/100ML SOLN infusion  Inject 14.575 mcg/min into the vein continuous. 09/27/15  Yes Christiane Ha, MD  potassium chloride SA (K-DUR,KLOR-CON) 20 MEQ tablet Take 2 tablets (40 mEq total) by mouth 2 (two) times daily. 03/19/16  Yes Graciella Freer, PA-C  dextromethorphan-guaiFENesin (ROBITUSSIN-DM) 10-100 MG/5ML liquid Take 10 mLs by mouth every 4 (four) hours as needed for cough. Reported on 02/27/2016    Historical Provider, MD  metolazone (ZAROXOLYN) 2.5 MG tablet Take 2.5 mg by mouth daily as needed (blood pressure). Reported on 12/13/2015    Historical Provider, MD    Physical Exam: Filed Vitals:   03/24/16 1610 03/24/16 1958 03/24/16 2128 03/24/16 2257  BP: 109/68 112/66 98/80 107/56  Pulse: 83 83 79 86  Temp: 98.2 F (36.8 C) 98.1 F (36.7 C) 98.2 F (36.8 C)   TempSrc: Oral Oral Oral   Resp: SpO2: 98% 97% 99% 95%      Constitutional: NAD, calm, comfortable Eyes: PERTLA, lids and conjunctivae normal ENMT: Mucous membranes are moist. Posterior pharynx clear of any exudate or lesions.   Neck: normal, supple, no masses, no  thyromegaly Respiratory: clear to auscultation bilaterally, no wheezing, no crackles. Normal respiratory effort. No accessory muscle use.  Cardiovascular: S1 & S2 heard, regular rate and rhythm. 2+ pretibial edema b/l. No significant JVD. Abdomen: No distension, no tenderness, no masses palpated. Bowel sounds normal.  Musculoskeletal: no clubbing / cyanosis. No joint deformity upper and lower extremities. Normal muscle tone.  Skin: Erythema, heat, and edema overly LLE distally with a ~1.5 soft-tissue defect posteriorly draining scant amt of serosanguinous fluid.  Neurologic: CN 2-12 grossly intact. Sensation intact, DTR normal. Strength 5/5 in all 4 limbs.  Psychiatric: Normal judgment and insight. Alert and oriented x 3. Normal mood and affect.     Labs on Admission: I have personally reviewed following labs and imaging studies  CBC:  Recent Labs Lab 03/24/16 2119  WBC 11.5*  NEUTROABS 9.7*  HGB 12.7  HCT 38.3  MCV 91.8  PLT 202   Basic Metabolic Panel:  Recent Labs Lab 03/24/16 2119  NA 136  K 3.4*  CL 98*  CO2 28  GLUCOSE 266*  BUN 84*  CREATININE 2.10*  CALCIUM 9.7   GFR: Estimated Creatinine Clearance: 18.3 mL/min (by C-G formula based on Cr of 2.1). Liver Function Tests:  Recent Labs Lab 03/24/16 2119  AST 35  ALT 46  ALKPHOS 75  BILITOT 1.3*  PROT 7.0  ALBUMIN 4.1   No results for input(s): LIPASE, AMYLASE in the last 168 hours. No results for input(s): AMMONIA in the last 168 hours. Coagulation Profile: No results for input(s): INR, PROTIME in the last 168 hours. Cardiac Enzymes: No results for input(s): CKTOTAL, CKMB, CKMBINDEX, TROPONINI in the last 168 hours. BNP (last 3 results) No results for input(s): PROBNP in the last 8760 hours. HbA1C: No results for input(s): HGBA1C in the last 72 hours. CBG: No results for input(s): GLUCAP in the last 168 hours. Lipid Profile: No results for input(s): CHOL, HDL, LDLCALC, TRIG, CHOLHDL, LDLDIRECT in  the last 72 hours. Thyroid Function Tests: No results for input(s): TSH, T4TOTAL, FREET4, T3FREE, THYROIDAB in the last 72 hours. Anemia Panel: No results for input(s): VITAMINB12, FOLATE, FERRITIN, TIBC, IRON, RETICCTPCT in the last 72 hours. Urine analysis:    Component Value Date/Time   COLORURINE YELLOW 09/25/2015 0104   APPEARANCEUR HAZY* 09/25/2015 0104   LABSPEC 1.013 09/25/2015 0104   PHURINE 5.5 09/25/2015 0104   GLUCOSEU  NEGATIVE 09/25/2015 0104   GLUCOSEU >=1000* 12/25/2014 1006   HGBUR NEGATIVE 09/25/2015 0104   BILIRUBINUR NEGATIVE 09/25/2015 0104   KETONESUR NEGATIVE 09/25/2015 0104   PROTEINUR NEGATIVE 09/25/2015 0104   UROBILINOGEN 0.2 03/31/2015 1152   NITRITE NEGATIVE 09/25/2015 0104   LEUKOCYTESUR NEGATIVE 09/25/2015 0104   Sepsis Labs: @LABRCNTIP (procalcitonin:4,lacticidven:4) )No results found for this or any previous visit (from the past 240 hour(s)).   Radiological Exams on Admission: No results found.  EKG: Not performed, will obtain as appropriate.   Assessment/Plan  1. Cellulitis  - Continued to worsen despite outpt treatment with doxycycline    - Will treat empirically with IV Rocephin  - Elevated the leg  - Pain-control prn  - Trend inflammatory markers and clinical progress, convert abx to PO as appropriate   2. Chronic systolic CHF  - TTE (01/31/15) with EF 20-25%  - Dependent on milrinone infusion  - Appears euvolemic at time of admission  - Continue Lasix at current dosing (80 mg PO daily)  - Follow I/Os, daily wts, fluid-restrict diet, SLIV   3. Paroxysmal atrial fibrillation  - In sinus rhythm at time of admission  - CHADS-VASc score is 28 (age x2, gender, CHF, PAD, HTN, DM)  - Continue AC with Eliquis  - Continue amiodarone at current dose    4. Hypertension  - Running on the low side at time of admission  - Managed with hydralazine at home, will continue with close monitoring    5. Type II DM  - A1c was 8.5% in March 2017,  reflecting sub-optimal glycemic-control at that time  - Managed with Toujeo and Humalog per sliding-scale at home  - While in hospital, will check CBG with meals and qHS, institute a moderate-intensity SSI to be adjusted prn   - Carb-modified diet   6. Hypokalemia  - Serum potassium 3.4 on admission, likely secondary to Lasix use  - Supplemented with 30 mEq IV  - Check magnesium level and replete prn  - Repeat chem panel in am    7. CKD stage IV  - SCr 2.10 on admission, stable relative to prior measurements  - Avoid nephrotoxins where possible  - Follow fluid-status closely and intervene prn     DVT prophylaxis: On Eliquis  Code Status: Full  Family Communication: Daughter updated at the bedside  Disposition Plan: Observe on telemetry   Consults called: None   Admission status: Observation    Briscoe Deutscher, MD [facility] Pager [phone]  If 7PM-7AM, please contact night-coverage [email or web address] Password [email or web address]  03/24/2016, 11:25 PM

## 2016-03-24 NOTE — ED Notes (Signed)
Per wound care center, pt with hx peripheral vascular disease and DM2 c/o itch to posterior calf which she scratched until it bled, seen 4 days ago for wound to posterior left calf, at which time eschar and necrotic tissue were removed down to muscle, no infection found at that time, cause of the wound unclear. Pt has severe cardiomyopathy and is on milrinone infustion, has bioprosthetic aortic and mitral valves. Wound tested positive for antibiotic resistant organism. On assessment, left leg is erythematous, hyperthermic, with severe pitting edema. Pedal pulse 2+.

## 2016-03-24 NOTE — ED Provider Notes (Signed)
CSN: [id number]     Arrival date & time 03/24/16  1601 History   First MD Initiated Contact with Patient 03/24/16 2009     Chief Complaint  Patient presents with  . Wound Check  . Cellulitis     (Consider location/radiation/quality/duration/timing/severity/associated sxs/prior Treatment) HPI Comments: 82-yo patient was a history of , DM, HLD, PVD, CHF on Milrinone infusion, presents on recommendation of home health nurse with left red, warm swollen leg. She and her daughter report that 2 months ago she suffered an injury to the back of the left calf that has been non-healing and cared for at home by daughter and in-home nursing. There was no concern for infection until 4 days ago when she started having the current symptoms of redness, increased pain and swelling. No fever, nausea, vomiting. Blood sugar has been running "high" per daughter. The patient reports she feels well other than symptoms associated with the leg.  Patient is a 82 y.o. female presenting with wound check. The history is provided by the patient and a relative. No language interpreter was used.  Wound Check Pertinent negatives include no fever, nausea or vomiting.    Past Medical History  Diagnosis Date  . Peripheral artery disease (HCC)   . Hyperlipidemia        . Arthritis   . Aortic stenosis      a. s/p AVR  . BUNDLE BRANCH BLOCK, LEFT    . CAROTID ARTERY STENOSIS 01/05/2009  . CVA 03/14/2010  . PERIPHERAL VASCULAR DISEASE 07/30/2007  . Chronic combined systolic and diastolic CHF (congestive heart failure) (HCC)     a. RHC 09/2011 RA 8, RV 58/2/11; PA 54/22 (37) PCWP 20; F CO/CI 4.57/2.67 PVR 3.7; b. L main no sig dz, LAD luminal irreg; LCx lg Ramus with luminal irreg, small AV Lcx witout sig dz, RCA luminal irreg; severe mitral annular calcifications; AV calcified c. EF 45-50% (07/2013);  d. 11/2014 Echo: EF 20-25%, diff HK, nl AV/MV, sev dil LA, mod TR/PR, PASP .  . Diabetes mellitus   . HTN (hypertension)    . Iron deficiency anemia 09/21/2011  . History of shingles   . Mitral regurgitation     a. s/p MVR  . S/P aortic valve replacement with bioprosthetic valve 06/15/2013    21 mm [facility] Ease bovine pericardial tissue valve  . S/P mitral valve replacement with bioprosthetic valve 06/15/2013    25 mm [facility] Mitral bovine pericardial tissue valve  . Fracture, fibula, shaft 01/30/2015    right   Past Surgical History  Procedure Laterality Date  . Carotid endarterectomy Left 2011  . Polypectomy  12/27/04  . Cataract extraction Bilateral 2009  . Tee without cardioversion N/A 05/19/2013    Procedure: TRANSESOPHAGEAL ECHOCARDIOGRAM (TEE);  Surgeon: Laurey Morale, MD;  Location: [facility] ENDOSCOPY;  Service: Cardiovascular;  Laterality: N/A;  . Colonoscopy    . Cardiac catheterization  2012  . Aortic valve replacement N/A 06/15/2013    Procedure: AORTIC VALVE REPLACEMENT (AVR);  Surgeon: Purcell Nails, MD;  Location: [facility] OR;  Service: Open Heart Surgery;  Laterality: N/A;  . Intraoperative transesophageal echocardiogram N/A 06/15/2013    Procedure: INTRAOPERATIVE TRANSESOPHAGEAL ECHOCARDIOGRAM;  Surgeon: Purcell Nails, MD;  Location: [facility] OR;  Service: Open Heart Surgery;  Laterality: N/A;  . Mitral valve replacement N/A 06/15/2013    Procedure: MITRAL VALVE (MV) REPLACEMENT;  Surgeon: Purcell Nails, MD;  Location: [facility] OR;  Service: Open Heart Surgery;  Laterality: N/A;  .  Epicardial pacing lead placement N/A 06/15/2013    Procedure: EPICARDIAL PACING LEAD PLACEMENT;  Surgeon: Purcell Nails, MD;  Location: [facility] OR;  Service: Open Heart Surgery;  Laterality: N/A;  . Left and right heart catheterization with coronary angiogram N/A 09/19/2011    Procedure: LEFT AND RIGHT HEART CATHETERIZATION WITH CORONARY ANGIOGRAM;  Surgeon: Dolores Patty, MD;  Location: [facility] CATH LAB;  Service: Cardiovascular;  Laterality: N/A;  . Orif ankle fracture Right 01/31/2015    Procedure: OPEN REDUCTION INTERNAL  FIXATION (ORIF) ANKLE FRACTURE;  Surgeon: Eldred Manges, MD;  Location: [facility] OR;  Service: Orthopedics;  Laterality: Right;  . Ep implantable device N/A 03/21/2015    Procedure: BiV Pacemaker Insertion CRT-P;  Surgeon: Duke Salvia, MD;  Location: [facility] INVASIVE CV LAB;  Service: Cardiovascular;  Laterality: N/A;  . Pacemaker insertion     Family History  Problem Relation Age of Onset  . Colon cancer Mother   . Diabetes Neg Hx   . Coronary artery disease Neg Hx    Social History  Substance Use Topics  . Smoking status: Never Smoker   . Smokeless tobacco: Never Used  . Alcohol Use: No   OB History    No data available     Review of Systems  Constitutional: Negative for fever and appetite change.  Respiratory: Negative.   Cardiovascular: Negative.   Gastrointestinal: Negative.  Negative for nausea and vomiting.  Musculoskeletal:       See HPI.  Skin: Positive for color change and wound.  Neurological: Negative for light-headedness.  Psychiatric/Behavioral: Positive for confusion (Per daughter there was some confusion 2 days ago that has not persisted.).      Allergies  Other and Prednisone  Home Medications   Prior to Admission medications   Medication Sig Start Date End Date Taking? Authorizing Provider  acetaminophen (TYLENOL) 500 MG tablet Take 500 mg by mouth every 6 (six) hours as needed (pain).    Yes Historical Provider, MD  amiodarone (PACERONE) 200 MG tablet Take 1 tablet (200 mg total) by mouth daily. 06/28/15  Yes Dolores Patty, MD  apixaban (ELIQUIS) 2.5 MG TABS tablet Take 1 tablet (2.5 mg total) by mouth 2 (two) times daily. 06/21/15  Yes Laurey Morale, MD  ferrous sulfate 325 (65 FE) MG tablet Take 1 tablet (325 mg total) by mouth 2 (two) times daily with a meal. 02/28/14  Yes Etta Grandchild, MD  furosemide (LASIX) 80 MG tablet Take 1 tablet (80 mg total) by mouth daily. HOLD FOR WEIGHT LESS THAN 125LBS 11/14/15  Yes Dolores Patty, MD  hydrALAZINE  (APRESOLINE) 10 MG tablet Take 10 mg by mouth 2 (two) times daily.   Yes Historical Provider, MD  Insulin Glargine (TOUJEO SOLOSTAR) 300 UNIT/ML SOPN Inject 7-10 Units into the skin at bedtime.    Yes Historical Provider, MD  Insulin Lispro (HUMALOG KWIKPEN) 200 UNIT/ML SOPN Inject 3 Units into the skin 3 (three) times daily as needed (high blood sugar).    Yes Historical Provider, MD  isosorbide mononitrate (IMDUR) 30 MG 24 hr tablet Take 0.5 tablets (15 mg total) by mouth daily. 08/14/15  Yes Dolores Patty, MD  levothyroxine (SYNTHROID, LEVOTHROID) 75 MCG tablet Take 1 tablet (75 mcg total) by mouth daily. 12/31/15  Yes Etta Grandchild, MD  milrinone [facility]) 20 MG/100ML SOLN infusion Inject 14.575 mcg/min into the vein continuous. 09/27/15  Yes Christiane Ha, MD  potassium chloride SA (K-DUR,KLOR-CON) 20 MEQ tablet Take  2 tablets (40 mEq total) by mouth 2 (two) times daily. 03/19/16  Yes Graciella Freer, PA-C  dextromethorphan-guaiFENesin (ROBITUSSIN-DM) 10-100 MG/5ML liquid Take 10 mLs by mouth every 4 (four) hours as needed for cough. Reported on 02/27/2016    Historical Provider, MD  metolazone (ZAROXOLYN) 2.5 MG tablet Take 2.5 mg by mouth daily as needed (blood pressure). Reported on 12/13/2015    Historical Provider, MD   BP 112/66 mmHg  Pulse 83  Temp(Src) 98.1 F (36.7 C) (Oral)  Resp 20  SpO2 97% Physical Exam  Constitutional: She is oriented to person, place, and time. She appears well-developed and well-nourished.  HENT:  Head: Normocephalic and atraumatic.  Eyes: Pupils are equal, round, and reactive to light.  Neck: Normal range of motion.  Cardiovascular: Normal rate.   Pulmonary/Chest: Effort normal.  Musculoskeletal:  Left lower extremity significantly warm, red, swollen. There is a full thickness circular wound to posterior proximal calf with minimal drainage. Entire leg significantly tender.  Neurological: She is alert and oriented to person, place, and  time.  Skin: Skin is warm and dry. There is erythema.  Psychiatric: She has a normal mood and affect.    ED Course  Procedures (including critical care time) Labs Review Labs Reviewed  LACTIC ACID, PLASMA  LACTIC ACID, PLASMA  CBC WITH DIFFERENTIAL/PLATELET  COMPREHENSIVE METABOLIC PANEL   Results for orders placed or performed during the hospital encounter of 03/24/16  Lactic acid, plasma  Result Value Ref Range   Lactic Acid, Venous 1.1 0.5 - 2.0 mmol/L  CBC with Differential  Result Value Ref Range   WBC 11.5 (H) 4.0 - 10.5 K/uL   RBC 4.17 3.87 - 5.11 MIL/uL   Hemoglobin 12.7 12.0 - 15.0 g/dL   HCT 83.7 79.3 - 96.8 %   MCV 91.8 78.0 - 100.0 fL   MCH 30.5 26.0 - 34.0 pg   MCHC 33.2 30.0 - 36.0 g/dL   RDW 86.4 84.7 - 20.7 %   Platelets 202 150 - 400 K/uL   Neutrophils Relative % 84 %   Neutro Abs 9.7 (H) 1.7 - 7.7 K/uL   Lymphocytes Relative 9 %   Lymphs Abs 1.0 0.7 - 4.0 K/uL   Monocytes Relative 6 %   Monocytes Absolute 0.7 0.1 - 1.0 K/uL   Eosinophils Relative 1 %   Eosinophils Absolute 0.1 0.0 - 0.7 K/uL   Basophils Relative 0 %   Basophils Absolute 0.0 0.0 - 0.1 K/uL  Comprehensive metabolic panel  Result Value Ref Range   Sodium 136 135 - 145 mmol/L   Potassium 3.4 (L) 3.5 - 5.1 mmol/L   Chloride 98 (L) 101 - 111 mmol/L   CO2 28 22 - 32 mmol/L   Glucose, Bld 266 (H) 65 - 99 mg/dL   BUN 84 (H) 6 - 20 mg/dL   Creatinine, Ser 2.18 (H) 0.44 - 1.00 mg/dL   Calcium 9.7 8.9 - 28.8 mg/dL   Total Protein 7.0 6.5 - 8.1 g/dL   Albumin 4.1 3.5 - 5.0 g/dL   AST 35 15 - 41 U/L   ALT 46 14 - 54 U/L   Alkaline Phosphatase 75 38 - 126 U/L   Total Bilirubin 1.3 (H) 0.3 - 1.2 mg/dL   GFR calc non Af Amer 21 (L) >60 mL/min   GFR calc Af Amer 24 (L) >60 mL/min   Anion gap 10 5 - 15   Imaging Review No results found. I have personally reviewed and evaluated these images  and lab results as part of my medical decision-making.   EKG Interpretation None      MDM    Final diagnoses:  None    1. Cellulitis left lower extremity 2. Non-healing wound  Patient presents with concern for cellulitis in left LE associated with non-healing wound x 2 months. Redness, pain, swelling x 4 days. No fever. Anticipate admission for treatment of cellulitis. Patient otherwise stable, normal VS. Labs pending. Dr. Jeraldine Loots made aware.  10:45 - Discussed admission with Dr. Antionette Char who accepts patient onto hospitalist service.   Elpidio Anis, PA-C 03/24/16 2253  Gerhard Munch, MD 03/28/16 2255

## 2016-03-25 ENCOUNTER — Observation Stay (HOSPITAL_COMMUNITY): Payer: Medicare Other

## 2016-03-25 ENCOUNTER — Encounter (HOSPITAL_COMMUNITY): Payer: Self-pay | Admitting: Nurse Practitioner

## 2016-03-25 DIAGNOSIS — I5022 Chronic systolic (congestive) heart failure: Secondary | ICD-10-CM | POA: Diagnosis not present

## 2016-03-25 DIAGNOSIS — Z8673 Personal history of transient ischemic attack (TIA), and cerebral infarction without residual deficits: Secondary | ICD-10-CM | POA: Diagnosis not present

## 2016-03-25 DIAGNOSIS — N184 Chronic kidney disease, stage 4 (severe): Secondary | ICD-10-CM | POA: Diagnosis not present

## 2016-03-25 DIAGNOSIS — E1151 Type 2 diabetes mellitus with diabetic peripheral angiopathy without gangrene: Secondary | ICD-10-CM | POA: Diagnosis present

## 2016-03-25 DIAGNOSIS — I130 Hypertensive heart and chronic kidney disease with heart failure and stage 1 through stage 4 chronic kidney disease, or unspecified chronic kidney disease: Secondary | ICD-10-CM | POA: Diagnosis present

## 2016-03-25 DIAGNOSIS — Z7901 Long term (current) use of anticoagulants: Secondary | ICD-10-CM | POA: Diagnosis not present

## 2016-03-25 DIAGNOSIS — Z952 Presence of prosthetic heart valve: Secondary | ICD-10-CM | POA: Diagnosis not present

## 2016-03-25 DIAGNOSIS — L03116 Cellulitis of left lower limb: Secondary | ICD-10-CM | POA: Diagnosis not present

## 2016-03-25 DIAGNOSIS — E876 Hypokalemia: Secondary | ICD-10-CM | POA: Diagnosis present

## 2016-03-25 DIAGNOSIS — Z452 Encounter for adjustment and management of vascular access device: Secondary | ICD-10-CM

## 2016-03-25 DIAGNOSIS — I480 Paroxysmal atrial fibrillation: Secondary | ICD-10-CM | POA: Diagnosis not present

## 2016-03-25 DIAGNOSIS — E1122 Type 2 diabetes mellitus with diabetic chronic kidney disease: Secondary | ICD-10-CM | POA: Diagnosis present

## 2016-03-25 DIAGNOSIS — E0821 Diabetes mellitus due to underlying condition with diabetic nephropathy: Secondary | ICD-10-CM | POA: Diagnosis not present

## 2016-03-25 DIAGNOSIS — E785 Hyperlipidemia, unspecified: Secondary | ICD-10-CM | POA: Diagnosis present

## 2016-03-25 DIAGNOSIS — Z800 Family history of malignant neoplasm of digestive organs: Secondary | ICD-10-CM | POA: Diagnosis not present

## 2016-03-25 DIAGNOSIS — Z888 Allergy status to other drugs, medicaments and biological substances status: Secondary | ICD-10-CM | POA: Diagnosis not present

## 2016-03-25 DIAGNOSIS — I5042 Chronic combined systolic (congestive) and diastolic (congestive) heart failure: Secondary | ICD-10-CM | POA: Diagnosis present

## 2016-03-25 DIAGNOSIS — Z794 Long term (current) use of insulin: Secondary | ICD-10-CM | POA: Diagnosis not present

## 2016-03-25 DIAGNOSIS — Z79899 Other long term (current) drug therapy: Secondary | ICD-10-CM | POA: Diagnosis not present

## 2016-03-25 LAB — PROCALCITONIN: Procalcitonin: 0.17 ng/mL

## 2016-03-25 LAB — BASIC METABOLIC PANEL
Anion gap: 11 (ref 5–15)
BUN: 76 mg/dL — AB (ref 6–20)
CALCIUM: 9.2 mg/dL (ref 8.9–10.3)
CHLORIDE: 96 mmol/L — AB (ref 101–111)
CO2: 27 mmol/L (ref 22–32)
CREATININE: 2.04 mg/dL — AB (ref 0.44–1.00)
GFR calc non Af Amer: 21 mL/min — ABNORMAL LOW (ref >60)
GFR, EST AFRICAN AMERICAN: 25 mL/min — AB (ref >60)
Glucose, Bld: 295 mg/dL — ABNORMAL HIGH (ref 65–99)
Potassium: 3.3 mmol/L — ABNORMAL LOW (ref 3.5–5.1)
SODIUM: 134 mmol/L — AB (ref 135–145)

## 2016-03-25 LAB — GLUCOSE, CAPILLARY
GLUCOSE-CAPILLARY: 236 mg/dL — AB (ref 65–99)
Glucose-Capillary: 268 mg/dL — ABNORMAL HIGH (ref 65–99)
Glucose-Capillary: 139 mg/dL — ABNORMAL HIGH (ref 65–99)
Glucose-Capillary: 174 mg/dL — ABNORMAL HIGH (ref 65–99)
Glucose-Capillary: 266 mg/dL — ABNORMAL HIGH (ref 65–99)

## 2016-03-25 LAB — CBC WITH DIFFERENTIAL/PLATELET
BASOS PCT: 0 %
Basophils Absolute: 0 10*3/uL (ref 0.0–0.1)
EOS ABS: 0.1 10*3/uL (ref 0.0–0.7)
EOS PCT: 2 %
HCT: 37.5 % (ref 36.0–46.0)
HEMOGLOBIN: 12.3 g/dL (ref 12.0–15.0)
LYMPHS ABS: 0.9 10*3/uL (ref 0.7–4.0)
Lymphocytes Relative: 10 %
MCH: 30.4 pg (ref 26.0–34.0)
MCHC: 32.8 g/dL (ref 30.0–36.0)
MCV: 92.6 fL (ref 78.0–100.0)
MONOS PCT: 6 %
Monocytes Absolute: 0.5 10*3/uL (ref 0.1–1.0)
NEUTROS PCT: 82 %
Neutro Abs: 7.3 10*3/uL (ref 1.7–7.7)
PLATELETS: 180 10*3/uL (ref 150–400)
RBC: 4.05 MIL/uL (ref 3.87–5.11)
RDW: 15.1 % (ref 11.5–15.5)
WBC: 8.8 10*3/uL (ref 4.0–10.5)

## 2016-03-25 LAB — MAGNESIUM: MAGNESIUM: 2.3 mg/dL (ref 1.7–2.4)

## 2016-03-25 LAB — LACTIC ACID, PLASMA: LACTIC ACID, VENOUS: 1 mmol/L (ref 0.5–2.0)

## 2016-03-25 MED ORDER — HYDROXYZINE HCL 10 MG PO TABS
10.0000 mg | ORAL_TABLET | Freq: Three times a day (TID) | ORAL | Status: DC | PRN
  Filled 2016-03-25: qty 1

## 2016-03-25 MED ORDER — POTASSIUM CHLORIDE CRYS ER 20 MEQ PO TBCR
40.0000 meq | EXTENDED_RELEASE_TABLET | Freq: Once | ORAL | Status: AC
  Administered 2016-03-25: 40 meq via ORAL
  Filled 2016-03-25: qty 2

## 2016-03-25 MED ORDER — SODIUM CHLORIDE 0.9% FLUSH
10.0000 mL | INTRAVENOUS | Status: DC | PRN
  Administered 2016-03-26 – 2016-03-27 (×2): 10 mL
  Filled 2016-03-25 (×2): qty 40

## 2016-03-25 MED ORDER — INSULIN GLARGINE 100 UNIT/ML ~~LOC~~ SOLN
5.0000 [IU] | Freq: Every day | SUBCUTANEOUS | Status: DC
  Administered 2016-03-25 – 2016-03-26 (×2): 5 [IU] via SUBCUTANEOUS
  Filled 2016-03-25 (×3): qty 0.05

## 2016-03-25 MED FILL — Milrinone Lactate in Dextrose 5% IV Soln 20 MG/100ML: INTRAVENOUS | Qty: 100 | Status: AC

## 2016-03-25 NOTE — Progress Notes (Signed)
Inpatient Diabetes Program Recommendations  AACE/ADA: New Consensus Statement on Inpatient Glycemic Control (2015)  Target Ranges:  Prepandial:   less than 140 mg/dL      Peak postprandial:   less than 180 mg/dL (1-2 hours)      Critically ill patients:  140 - 180 mg/dL   Lab Results  Component Value Date   GLUCAP 139* 03/25/2016   HGBA1C 8.5* 12/31/2015    Review of Glycemic Control  Diabetes history: DM2 Outpatient Diabetes medications: Toujeo 7-10 units QHS, Humalog 3 units tidwc Current orders for Inpatient glycemic control: Novolog moderate tidwc  Needs portion of basal insulin and meal coverage.  Inpatient Diabetes Program Recommendations:    Add Lantus 5 units QHS Add Novolog 2 units tidwc for meal coverage insulin.  Will continue to follow. Thank you. Ailene Ards, RD, LDN, CDE Inpatient Diabetes Coordinator [phone]

## 2016-03-25 NOTE — Progress Notes (Signed)
PROGRESS NOTE    Joanna [name]  ZOX:[id number] DOB: 1930-06-11 DOA: 03/24/2016 PCP: Sanda Linger, MD    Assessment & Plan:   Principal Problem:   Cellulitis Active Problems:   Diabetes mellitus with renal complications (HCC)   Essential hypertension   PAF (paroxysmal atrial fibrillation) (HCC)   CKD (chronic kidney disease) stage 4, GFR 15-29 ml/min (HCC)   Chronic systolic heart failure (HCC)   Cellulitis of left lower extremity  #1 left lower extremity cellulitis Patient presented with worsening cellulitis despite outpatient treatment with doxycycline. Clinical improvement. Patient was placed on IV Rocephin. Follow. If continued improvement will transition to oral antibiotics in the next 1-2 days. Follow.  #2 chronic systolic heart failure Stable. Patient currently euvolemic. 2-D echo from 01/31/2059 with a EF of 20-25%.Continue home regimen of milrinone infusion, Lasix, fluid restriction.  #3 paroxysmal atrial fibrillation CHADS2VASC score 7 currently rate controlled on amiodarone. Continue eliquis for anticoagulation.  #4 hypertension Blood pressure borderline. Patient however asymptomatic. Continue home regimen and monitor closely.  #5 type 2 diabetes Hemoglobin A1c was 8.5 on March 2017. Home regimen on hold. Place on Lantus 5 units daily. Sliding scale insulin., Modified diet.  #6 hypokalemia Replete.  #7 chronic kidney disease stage IV Stable.     DVT prophylaxis: eliquis. Code Status: Full Family Communication: Updated patient and son at bedside. Disposition Plan: Home when cellulitis improved. Hopefully in 1-2 days.   Consultants:   None  Procedures:   CXR 03/25/2016  Antimicrobials:   IV Rocephin 03/24/2016   Subjective: Patient states feels better than on admission. No SOB. No CP.  Objective: Filed Vitals:   03/24/16 2330 03/25/16 0019 03/25/16 0556 03/25/16 0933  BP: 109/61 112/75 96/57 97/67   Pulse: 81 84 74 75  Temp:  99.2 F (37.3 C)  98 F (36.7 C)   TempSrc:  Oral Oral   Resp: Height:   (1.651 m)    Weight:  55 kg (121 lb 4.1 oz) 57.5 kg (126 lb 12.2 oz)   SpO2: 95% 99% 93%     Intake/Output Summary (Last 24 hours) at 03/25/16 1243 Last data filed at 03/25/16 0602  Gross per 24 hour  Intake    595 ml  Output      1 ml  Net    594 ml   Filed Weights   03/25/16 0019 03/25/16 0556  Weight: 55 kg (121 lb 4.1 oz) 57.5 kg (126 lb 12.2 oz)    Examination:  General exam: Appears calm and comfortable  Respiratory system: Clear to auscultation. Respiratory effort normal. Cardiovascular system: S1 & S2 heard, RRR. No JVD, murmurs, rubs, gallops or clicks. No pedal edema. Gastrointestinal system: Abdomen is nondistended, soft and nontender. No organomegaly or masses felt. Normal bowel sounds heard. Central nervous system: Alert and oriented. No focal neurological deficits. Extremities: Symmetric 5 x 5 power. Skin: LLE with improved erythema, less warmth. TTP. Psychiatry: Judgement and insight appear normal. Mood & affect appropriate.     Data Reviewed: I have personally reviewed following labs and imaging studies  CBC:  Recent Labs Lab 03/24/16 2119 03/25/16 0505  WBC 11.5* 8.8  NEUTROABS 9.7* 7.3  HGB 12.7 12.3  HCT 38.3 37.5  MCV 91.8 92.6  PLT 202 180   Basic Metabolic Panel:  Recent Labs Lab 03/24/16 2119 03/25/16 0505  NA 136 134*  K 3.4* 3.3*  CL 98* 96*  CO2 28 27  GLUCOSE 266* 295*  BUN 84*  76*  CREATININE 2.10* 2.04*  CALCIUM 9.7 9.2  MG  --  2.3   GFR: Estimated Creatinine Clearance: 18.8 mL/min (by C-G formula based on Cr of 2.04). Liver Function Tests:  Recent Labs Lab 03/24/16 2119  AST 35  ALT 46  ALKPHOS 75  BILITOT 1.3*  PROT 7.0  ALBUMIN 4.1   No results for input(s): LIPASE, AMYLASE in the last 168 hours. No results for input(s): AMMONIA in the last 168 hours. Coagulation Profile: No results for input(s): INR, PROTIME in the last 168  hours. Cardiac Enzymes: No results for input(s): CKTOTAL, CKMB, CKMBINDEX, TROPONINI in the last 168 hours. BNP (last 3 results) No results for input(s): PROBNP in the last 8760 hours. HbA1C: No results for input(s): HGBA1C in the last 72 hours. CBG:  Recent Labs Lab 03/25/16 0104 03/25/16 0741 03/25/16 1158  GLUCAP 236* 268* 139*   Lipid Profile: No results for input(s): CHOL, HDL, LDLCALC, TRIG, CHOLHDL, LDLDIRECT in the last 72 hours. Thyroid Function Tests: No results for input(s): TSH, T4TOTAL, FREET4, T3FREE, THYROIDAB in the last 72 hours. Anemia Panel: No results for input(s): VITAMINB12, FOLATE, FERRITIN, TIBC, IRON, RETICCTPCT in the last 72 hours. Sepsis Labs:  Recent Labs Lab 03/24/16 2118 03/24/16 2332 03/25/16 0505  PROCALCITON  --   --  0.17  LATICACIDVEN 1.1 1.0  --     No results found for this or any previous visit (from the past 240 hour(s)).       Radiology Studies: Dg Chest Port 1 View  03/25/2016  CLINICAL DATA:  Central catheter placement. Aortic stenosis and mitral regurgitation EXAM: PORTABLE CHEST 1 VIEW COMPARISON:  January 14, 2016 FINDINGS: Central catheter tip is at the cavoatrial junction. No pneumothorax. There is a small left pleural effusion with patchy atelectasis in the left base. Lungs elsewhere clear. There is cardiomegaly with borderline pulmonary venous hypertension. Patient is status post mitral and aortic valve replacements. Pacemaker leads are attached to the right atrium, right ventricle, and left ventricle. No adenopathy evident. IMPRESSION: Central catheter tip at cavoatrial junction without pneumothorax. Stable cardiomegaly with evidence suggesting a degree of pulmonary vascular congestion. Atelectasis left base with small left effusion. Electronically Signed   By: Bretta Bang III M.D.   On: 03/25/2016 07:17        Scheduled Meds: . amiodarone  200 mg Oral Daily  . apixaban  2.5 mg Oral BID  . cefTRIAXone (ROCEPHIN)   IV  1 g Intravenous QHS  . ferrous sulfate  325 mg Oral BID WC  . furosemide  80 mg Oral Daily  . hydrALAZINE  10 mg Oral BID  . insulin aspart  0-15 Units Subcutaneous TID WC  . isosorbide mononitrate  15 mg Oral Daily  . levothyroxine  75 mcg Oral Daily  . sodium chloride flush  3 mL Intravenous Q12H  . sodium chloride flush  3 mL Intravenous Q12H   Continuous Infusions: . milrinone       LOS: 1 day    Time spent: 35 mins    THOMPSON,DANIEL, MD [facility] Pager [phone]  If 7PM-7AM, please contact night-coverage [email or web address] Password [facility] 03/25/2016, 12:43 PM

## 2016-03-25 NOTE — Consult Note (Signed)
WOC wound consult note Reason for Consult:Posterior LLE full thickness wound. Patient states she "scratched" it three weeks ago with her fingernail.  Wound is punctate. Wound type:Infectious vs trauma in the presence of diminished venous and/or arterial flow Pressure Ulcer POA: No Measurement: 0.6cm round x 0.4cm Wound XTK:WIOXB red, moist Drainage (amount, consistency, odor) None Periwound: Intact, erythematous (resolviong with antibiotic therapy) and edematous (also better with LE elevation since admitted last pm) Dressing procedure/placement/frequency: Patient appears to be responding to IV antibiotic therapy as erythema and pain are diminishing.  I will add a topical antimicrobial dressing (Xeroform) to this daily. WOC nursing team will not follow, but will remain available to this patient, the nursing and medical teams.  Please re-consult if needed. Thanks, Ladona Mow, MSN, RN, GNP, [place]  Pager# [phone]

## 2016-03-25 NOTE — Progress Notes (Signed)
[facility]  Patient Status: Active pt with [facility] prior to this admission  [facility] is providing the following services: HHRN and Home [facility] on home Milrinone in collaboration with [facility] Failure Team. [name] hospital team will follow [name] while inpatient to support transition back home at [place].  If patient discharges after hours, please call [phone].   Joanna [name] 03/25/2016, 8:07 AM

## 2016-03-26 DIAGNOSIS — L03116 Cellulitis of left lower limb: Principal | ICD-10-CM

## 2016-03-26 DIAGNOSIS — N184 Chronic kidney disease, stage 4 (severe): Secondary | ICD-10-CM

## 2016-03-26 DIAGNOSIS — I480 Paroxysmal atrial fibrillation: Secondary | ICD-10-CM

## 2016-03-26 LAB — BASIC METABOLIC PANEL
Anion gap: 9 (ref 5–15)
BUN: 79 mg/dL — AB (ref 6–20)
CALCIUM: 9.1 mg/dL (ref 8.9–10.3)
CHLORIDE: 98 mmol/L — AB (ref 101–111)
CO2: 29 mmol/L (ref 22–32)
CREATININE: 2.08 mg/dL — AB (ref 0.44–1.00)
GFR calc Af Amer: 24 mL/min — ABNORMAL LOW (ref >60)
GFR calc non Af Amer: 21 mL/min — ABNORMAL LOW (ref >60)
GLUCOSE: 201 mg/dL — AB (ref 65–99)
Potassium: 2.8 mmol/L — ABNORMAL LOW (ref 3.5–5.1)
Sodium: 136 mmol/L (ref 135–145)

## 2016-03-26 LAB — CBC
HCT: 33.2 % — ABNORMAL LOW (ref 36.0–46.0)
Hemoglobin: 11.1 g/dL — ABNORMAL LOW (ref 12.0–15.0)
MCH: 30.2 pg (ref 26.0–34.0)
MCHC: 33.4 g/dL (ref 30.0–36.0)
MCV: 90.2 fL (ref 78.0–100.0)
PLATELETS: 198 10*3/uL (ref 150–400)
RBC: 3.68 MIL/uL — AB (ref 3.87–5.11)
RDW: 14.8 % (ref 11.5–15.5)
WBC: 7.1 10*3/uL (ref 4.0–10.5)

## 2016-03-26 LAB — GLUCOSE, CAPILLARY
Glucose-Capillary: 206 mg/dL — ABNORMAL HIGH (ref 65–99)
Glucose-Capillary: 136 mg/dL — ABNORMAL HIGH (ref 65–99)
Glucose-Capillary: 186 mg/dL — ABNORMAL HIGH (ref 65–99)
Glucose-Capillary: 223 mg/dL — ABNORMAL HIGH (ref 65–99)

## 2016-03-26 LAB — PROCALCITONIN: Procalcitonin: 0.1 ng/mL

## 2016-03-26 MED ORDER — POTASSIUM CHLORIDE CRYS ER 20 MEQ PO TBCR
40.0000 meq | EXTENDED_RELEASE_TABLET | Freq: Two times a day (BID) | ORAL | Status: AC
  Administered 2016-03-26 (×2): 40 meq via ORAL
  Filled 2016-03-26 (×2): qty 2

## 2016-03-26 NOTE — Care Management Obs Status (Signed)
MEDICARE OBSERVATION STATUS NOTIFICATION   Patient Details  Name: Joanna [name] MRN: [id number] Date of Birth: 1930-05-01   Medicare Observation Status Notification Given:  Yes, Pt not comfortable signing.    Geni Bers, RN 03/26/2016, 9:09 AM

## 2016-03-26 NOTE — Progress Notes (Signed)
Pt is active with [facility] and will continue at discharge with [facility]. Will need resumption of Home Health Care.

## 2016-03-26 NOTE — Progress Notes (Signed)
PROGRESS NOTE    SHELL BLANCHETTE  EAV:[id number] DOB: 13-Mar-1930 DOA: 03/24/2016 PCP: Sanda Linger, MD    Assessment & Plan:   Principal Problem:   Cellulitis Active Problems:   Diabetes mellitus with renal complications (HCC)   Essential hypertension   PAF (paroxysmal atrial fibrillation) (HCC)   CKD (chronic kidney disease) stage 4, GFR 15-29 ml/min (HCC)   Chronic systolic heart failure (HCC)   Cellulitis of left lower extremity   PICC (peripherally inserted central catheter) in place  #1 left lower extremity cellulitis Patient presented with worsening cellulitis despite outpatient treatment with doxycycline.  -During this hospitalization she was started on ceftriaxone 1 g IV every 24 hours -Showing some clinical improvement continues to have erythema and swelling involving her left foot. -Anticipate transition to oral antimicrobial therapy in the next 24 hours if she continues to show clinical improvement  #2 chronic systolic heart failure -Stable. Patient currently euvolemic. 2-D echo from 01/31/2059 with a EF of 20-25%. -Continue home regimen of milrinone infusion, Lasix, fluid restriction.  #3 paroxysmal atrial fibrillation CHADS2VASC score 7 -currently rate controlled on amiodarone. Continue eliquis for anticoagulation.  #4 hypertension -Blood pressure borderline. Patient however asymptomatic. Continue home regimen and monitor closely.  #5 type 2 diabetes Hemoglobin A1c was 8.5 on March 2017.  -Lantus 5 units daily. Sliding scale insulin., Modified diet.  #6 hypokalemia -AM labs on 03/26/2016 showing potassium of 2.8 -Plan to replace with PO potassium   #7 chronic kidney disease stage IV Stable.     DVT prophylaxis: eliquis. Code Status: Full Family Communication: Updated patient and son at bedside. Disposition Plan: Home when cellulitis improved.   Consultants:   None  Procedures:   CXR 03/25/2016  Antimicrobials:   IV Rocephin  03/24/2016   Subjective: Patient states feels better than on admission. No SOB. No CP.  She ambulated around her room with walker  Objective: Filed Vitals:   03/25/16 2111 03/25/16 2207 03/26/16 0523 03/26/16 1032  BP: 93/41 103/67 96/52 96/51   Pulse: 76 81 69   Temp: 98.7 F (37.1 C) 98.3 F (36.8 C) 97.5 F (36.4 C)   TempSrc: Oral Oral Oral   Resp: Height:      Weight:   54.5 kg (120 lb 2.4 oz)   SpO2: 93% 98% 97%     Intake/Output Summary (Last 24 hours) at 03/26/16 1300 Last data filed at 03/26/16 1000  Gross per 24 hour  Intake 1334.29 ml  Output      0 ml  Net 1334.29 ml   Filed Weights   03/25/16 0019 03/25/16 0556 03/26/16 0523  Weight: 55 kg (121 lb 4.1 oz) 57.5 kg (126 lb 12.2 oz) 54.5 kg (120 lb 2.4 oz)    Examination:  General exam: Appears calm and comfortable  Respiratory system: Clear to auscultation. Respiratory effort normal. Cardiovascular system: S1 & S2 heard, RRR. No JVD, murmurs, rubs, gallops or clicks. No pedal edema. Gastrointestinal system: Abdomen is nondistended, soft and nontender. No organomegaly or masses felt. Normal bowel sounds heard. Central nervous system: Alert and oriented. No focal neurological deficits. Extremities: Symmetric 5 x 5 power. Skin: LLE with improved erythema, less warmth. TTP. Psychiatry: Judgement and insight appear normal. Mood & affect appropriate.     Data Reviewed: I have personally reviewed following labs and imaging studies  CBC:  Recent Labs Lab 03/24/16 2119 03/25/16 0505 03/26/16 0350  WBC 11.5* 8.8 7.1  NEUTROABS 9.7* 7.3  --   HGB  12.7 12.3 11.1*  HCT 38.3 37.5 33.2*  MCV 91.8 92.6 90.2  PLT 202 180 198   Basic Metabolic Panel:  Recent Labs Lab 03/24/16 2119 03/25/16 0505 03/26/16 0350  NA 136 134* 136  K 3.4* 3.3* 2.8*  CL 98* 96* 98*  CO2 28 27 29   GLUCOSE 266* 295* 201*  BUN 84* 76* 79*  CREATININE 2.10* 2.04* 2.08*  CALCIUM 9.7 9.2 9.1  MG  --  2.3  --     GFR: Estimated Creatinine Clearance: 17.6 mL/min (by C-G formula based on Cr of 2.08). Liver Function Tests:  Recent Labs Lab 03/24/16 2119  AST 35  ALT 46  ALKPHOS 75  BILITOT 1.3*  PROT 7.0  ALBUMIN 4.1   No results for input(s): LIPASE, AMYLASE in the last 168 hours. No results for input(s): AMMONIA in the last 168 hours. Coagulation Profile: No results for input(s): INR, PROTIME in the last 168 hours. Cardiac Enzymes: No results for input(s): CKTOTAL, CKMB, CKMBINDEX, TROPONINI in the last 168 hours. BNP (last 3 results) No results for input(s): PROBNP in the last 8760 hours. HbA1C: No results for input(s): HGBA1C in the last 72 hours. CBG:  Recent Labs Lab 03/25/16 1158 03/25/16 1639 03/25/16 2118 03/26/16 0747 03/26/16 1124  GLUCAP 139* 174* 266* 206* 136*   Lipid Profile: No results for input(s): CHOL, HDL, LDLCALC, TRIG, CHOLHDL, LDLDIRECT in the last 72 hours. Thyroid Function Tests: No results for input(s): TSH, T4TOTAL, FREET4, T3FREE, THYROIDAB in the last 72 hours. Anemia Panel: No results for input(s): VITAMINB12, FOLATE, FERRITIN, TIBC, IRON, RETICCTPCT in the last 72 hours. Sepsis Labs:  Recent Labs Lab 03/24/16 2118 03/24/16 2332 03/25/16 0505 03/26/16 0350  PROCALCITON  --   --  0.17 0.10  LATICACIDVEN 1.1 1.0  --   --     No results found for this or any previous visit (from the past 240 hour(s)).       Radiology Studies: Dg Chest Port 1 View  03/25/2016  CLINICAL DATA:  Central catheter placement. Aortic stenosis and mitral regurgitation EXAM: PORTABLE CHEST 1 VIEW COMPARISON:  January 14, 2016 FINDINGS: Central catheter tip is at the cavoatrial junction. No pneumothorax. There is a small left pleural effusion with patchy atelectasis in the left base. Lungs elsewhere clear. There is cardiomegaly with borderline pulmonary venous hypertension. Patient is status post mitral and aortic valve replacements. Pacemaker leads are attached to  the right atrium, right ventricle, and left ventricle. No adenopathy evident. IMPRESSION: Central catheter tip at cavoatrial junction without pneumothorax. Stable cardiomegaly with evidence suggesting a degree of pulmonary vascular congestion. Atelectasis left base with small left effusion. Electronically Signed   By: Bretta Bang III M.D.   On: 03/25/2016 07:17        Scheduled Meds: . amiodarone  200 mg Oral Daily  . apixaban  2.5 mg Oral BID  . cefTRIAXone (ROCEPHIN)  IV  1 g Intravenous QHS  . ferrous sulfate  325 mg Oral BID WC  . furosemide  80 mg Oral Daily  . hydrALAZINE  10 mg Oral BID  . insulin aspart  0-15 Units Subcutaneous TID WC  . insulin glargine  5 Units Subcutaneous QHS  . isosorbide mononitrate  15 mg Oral Daily  . levothyroxine  75 mcg Oral Daily  . potassium chloride  40 mEq Oral BID  . sodium chloride flush  3 mL Intravenous Q12H  . sodium chloride flush  3 mL Intravenous Q12H   Continuous Infusions: .  milrinone 0.25 mcg/kg/min (03/26/16 0249)     LOS: 2 days    Time spent: 25 mins    Jeralyn Bennett, MD [facility] Pager [phone]  If 7PM-7AM, please contact night-coverage [email or web address] Password [email or web address] 03/26/2016, 1:00 PM -

## 2016-03-26 NOTE — Progress Notes (Signed)
Inpatient Diabetes Program Recommendations  AACE/ADA: New Consensus Statement on Inpatient Glycemic Control (2015)  Target Ranges:  Prepandial:   less than 140 mg/dL      Peak postprandial:   less than 180 mg/dL (1-2 hours)      Critically ill patients:  140 - 180 mg/dL   Results for Joanna [name], Joanna [name] (MRN [id number]) as of 03/26/2016 10:34  Ref. Range 03/25/2016 07:41 03/25/2016 11:58 03/25/2016 16:39 03/25/2016 21:18  Glucose-Capillary Latest Ref Range: 65-99 mg/dL 779 (H) 390 (H) 300 (H) 266 (H)   Results for Joanna [name], Joanna [name] (MRN [id number]) as of 03/26/2016 10:34  Ref. Range 03/26/2016 07:47  Glucose-Capillary Latest Ref Range: 65-99 mg/dL 263 (H)    Home DM Meds: Toujeo Insulin: 7-10 units QHS       Humalog 3 units tidwc  Current Insulin Orders: Lantus 5 units QHS      Novolog Moderate Correction Scale/ SSI (0-15 units) TID AC      -Eating 50-100% if meals.  -Having elevated fasting glucose levels.    MD- Please consider the following in-hospital insulin adjustments:  1. Increase Lantus to 7 units QHS  2. Reduce Novolog Correction Scale/ SSI to Sensitive scale (0-9 units) TID AC (currently ordered as Moderate scale 0-15 units)  3. Start Novolog 3 units tidwc (hold if patient eats <50% of meal)      --Will follow patient during hospitalization--  Ambrose Finland RN, MSN, CDE Diabetes Coordinator Inpatient Glycemic Control Team Team Pager: [phone] (8a-5p)

## 2016-03-27 DIAGNOSIS — I5022 Chronic systolic (congestive) heart failure: Secondary | ICD-10-CM

## 2016-03-27 LAB — BASIC METABOLIC PANEL
ANION GAP: 9 (ref 5–15)
BUN: 76 mg/dL — AB (ref 6–20)
CALCIUM: 8.8 mg/dL — AB (ref 8.9–10.3)
CO2: 30 mmol/L (ref 22–32)
CREATININE: 2.35 mg/dL — AB (ref 0.44–1.00)
Chloride: 98 mmol/L — ABNORMAL LOW (ref 101–111)
GFR calc Af Amer: 21 mL/min — ABNORMAL LOW (ref >60)
GFR calc non Af Amer: 18 mL/min — ABNORMAL LOW (ref >60)
GLUCOSE: 204 mg/dL — AB (ref 65–99)
Potassium: 3.3 mmol/L — ABNORMAL LOW (ref 3.5–5.1)
Sodium: 137 mmol/L (ref 135–145)

## 2016-03-27 LAB — GLUCOSE, CAPILLARY
GLUCOSE-CAPILLARY: 197 mg/dL — AB (ref 65–99)
Glucose-Capillary: 184 mg/dL — ABNORMAL HIGH (ref 65–99)

## 2016-03-27 LAB — CBC
HCT: 34.3 % — ABNORMAL LOW (ref 36.0–46.0)
HEMOGLOBIN: 11.6 g/dL — AB (ref 12.0–15.0)
MCH: 30.4 pg (ref 26.0–34.0)
MCHC: 33.8 g/dL (ref 30.0–36.0)
MCV: 90 fL (ref 78.0–100.0)
Platelets: 198 10*3/uL (ref 150–400)
RBC: 3.81 MIL/uL — ABNORMAL LOW (ref 3.87–5.11)
RDW: 14.8 % (ref 11.5–15.5)
WBC: 6.2 10*3/uL (ref 4.0–10.5)

## 2016-03-27 MED ORDER — SODIUM CHLORIDE 0.9 % IV BOLUS (SEPSIS)
500.0000 mL | Freq: Once | INTRAVENOUS | Status: AC
  Administered 2016-03-27: 500 mL via INTRAVENOUS

## 2016-03-27 MED ORDER — MILRINONE IN DEXTROSE 20 MG/100ML IV SOLN: 0.2500 ug/kg/min | INTRAVENOUS | Status: AC

## 2016-03-27 MED ORDER — CEPHALEXIN 500 MG PO CAPS: 500.0000 mg | ORAL_CAPSULE | Freq: Two times a day (BID) | ORAL | Status: DC

## 2016-03-27 NOTE — Care Management Important Message (Signed)
Important Message  Patient Details  Name: Joanna [name] MRN: [id number] Date of Birth: 11/04/1929   Medicare Important Message Given:  Yes    Haskell Flirt 03/27/2016, 1:05 PMImportant Message  Patient Details  Name: Joanna [name] MRN: [id number] Date of Birth: [birth year]   Medicare Important Message Given:  Yes    Haskell Flirt 03/27/2016, 1:05 PM

## 2016-03-27 NOTE — Discharge Summary (Signed)
Physician Discharge Summary  Joanna [name] ZOX:[id number] DOB: 31-Aug-1930 DOA: 03/24/2016  PCP: Sanda Linger, MD  Admit date: 03/24/2016 Discharge date: 03/27/2016  Time spent: 35 minutes  Recommendations for Outpatient Follow-up:  1. Please follow-up on her renal function, on day of discharge she had a creatinine of 2.35, increased from 2.1 on 03/24/2069 2. Prior to discharge home health services were resumed for home RN, she is on chronic milrinone drip 3. She was discharged on Keflex for lower extremity cellulitis   Discharge Diagnoses:  Principal Problem:   Cellulitis Active Problems:   Diabetes mellitus with renal complications (HCC)   Essential hypertension   PAF (paroxysmal atrial fibrillation) (HCC)   CKD (chronic kidney disease) stage 4, GFR 15-29 ml/min (HCC)   Chronic systolic heart failure (HCC)   Cellulitis of left lower extremity   PICC (peripherally inserted central catheter) in place   Discharge Condition: Stable/improved  Diet recommendation: Heart healthy  Filed Weights   03/25/16 0556 03/26/16 0523 03/27/16 0608  Weight: 57.5 kg (126 lb 12.2 oz) 54.5 kg (120 lb 2.4 oz) 55.1 kg (121 lb 7.6 oz)    History of present illness:  Joanna [name] is a 82 y.o. female with medical history significant for hypertension, diabetes, peripheral arterial disease, CK D stage IV, and chronic systolic CHF requiring milrinone infusion who presents to the ED with worsening erythema, edema, pain, and heat at the distal left lower extremity. Patient reportedly scratched the posterior aspect of the distal left lower extremity approximately 2 months ago and has had a small, nonhealing wound at that side ever since. She has been followed by wound care and there had been no concern for associated infection until approximately 4 days ago when she developed surrounding erythema, swelling, and tenderness. Patient was started on antibiotics 4 days ago with doxycycline, but there has been no  appreciable improvement, prompting her visit to the ED. Patient denies any recent fevers or chills and denies any drainage from the affected leg.  ED Course: Upon arrival to the ED, patient is found to be afebrile, saturating well on room air, and with vital signs stable. Chemistry panel features a BUN of 84 and serum creatinine of 2.10, both apparently stable relative to her baseline. CBC features a leukocytosis to 11,500 and lactic acid is reassuring at 1.1. Empiric vancomycin was started in the emergency department. Patient has remained hemodynamically stable in the ED and will be admitted to the hospital for ongoing evaluation and management of left lower extremity cellulitis.  Hospital Course:   Joanna [name] is a pleasant 82 year old female with a past medical history of advanced congestive heart failure having ejection fraction 20-25% on chronic milrinone infusion at home, admitted to medicine service on 03/24/2016 presented with increasing erythema, localized warmth, edema, pain involving left lower extremity. Symptoms thought to be secondary to lower extremity so let us and was started on IV antibiotic therapy with ceftriaxone. Showed gradual clinical improvement and by 03/27/2016 felt well enough to go home. She was discharged on Keflex  #1 left lower extremity cellulitis Patient presented with worsening cellulitis despite outpatient treatment with doxycycline.  -During this hospitalization she was started on ceftriaxone 1 g IV every 24 hours -Showing some clinical improvement continues to have erythema and swelling involving her left foot. -Discharged on Keflex 500 mg by mouth twice a day x 5 days -Please follow  #2 chronic systolic heart failure -Stable. Patient currently euvolemic. 2-D echo from 01/31/2059 with a EF of 20-25%. -Continue  home regimen of milrinone infusion -She has an upcoming appointment with the heart failure team next week -Please follow-up on renal function, on day of  discharge had creatinine of 2.3.  #3 paroxysmal atrial fibrillation CHADS2VASC score 7 -currently rate controlled on amiodarone. Continue eliquis for anticoagulation.  #4 hypokalemia -AM labs on 03/26/2016 showing potassium of 2.8 -On day of discharge had a potassium of 3.3 after receiving additional replacement. -She is on potassium replacement at home which will be continued.  #5 chronic kidney disease stage IV Patient having history of chronic kidney disease with baseline creatinine near 2.0. On day of discharge had creatinine of 2.3. Please follow renal function on hospital follow-up visit   Discharge Exam: Filed Vitals:   03/27/16 1223 03/27/16 1326  BP: 113/72 108/77  Pulse:  77  Temp:  98.3 F (36.8 C)  Resp:  16    General exam: Appears calm and comfortable  Respiratory system: Clear to auscultation. Respiratory effort normal. Cardiovascular system: S1 & S2 heard, RRR. No JVD, murmurs, rubs, gallops or clicks. No pedal edema. Gastrointestinal system: Abdomen is nondistended, soft and nontender. No organomegaly or masses felt. Normal bowel sounds heard. Central nervous system: Alert and oriented. No focal neurological deficits. Extremities: Symmetric 5 x 5 power. Skin: LLE with improved erythema, less warmth. TTP. Psychiatry: Judgement and insight appear normal. Mood & affect appropriate.   Discharge Instructions   Discharge Instructions    Call MD for:  difficulty breathing, headache or visual disturbances    Complete by:  As directed      Call MD for:  extreme fatigue    Complete by:  As directed      Call MD for:  hives    Complete by:  As directed      Call MD for:  persistant dizziness or light-headedness    Complete by:  As directed      Call MD for:  persistant nausea and vomiting    Complete by:  As directed      Call MD for:  redness, tenderness, or signs of infection (pain, swelling, redness, odor or green/yellow discharge around incision site)     Complete by:  As directed      Call MD for:  severe uncontrolled pain    Complete by:  As directed      Call MD for:  temperature >100.4    Complete by:  As directed      Call MD for:    Complete by:  As directed      Diet - low sodium heart healthy    Complete by:  As directed      Increase activity slowly    Complete by:  As directed           Current Discharge Medication List    START taking these medications   Details  cephALEXin (KEFLEX) 500 MG capsule Take 1 capsule (500 mg total) by mouth 2 (two) times daily. Qty: 10 capsule, Refills: 0      CONTINUE these medications which have NOT CHANGED   Details  acetaminophen (TYLENOL) 500 MG tablet Take 500 mg by mouth every 6 (six) hours as needed (pain).     amiodarone (PACERONE) 200 MG tablet Take 1 tablet (200 mg total) by mouth daily. Qty: 90 tablet, Refills: 3    apixaban (ELIQUIS) 2.5 MG TABS tablet Take 1 tablet (2.5 mg total) by mouth 2 (two) times daily. Qty: 180 tablet, Refills: 3    ferrous  sulfate 325 (65 FE) MG tablet Take 1 tablet (325 mg total) by mouth 2 (two) times daily with a meal. Qty: 180 tablet, Refills: 1   Associated Diagnoses: Iron deficiency anemia    furosemide (LASIX) 80 MG tablet Take 1 tablet (80 mg total) by mouth daily. HOLD FOR WEIGHT LESS THAN 125LBS Qty: 30 tablet, Refills: 6   Associated Diagnoses: Acute on chronic systolic CHF (congestive heart failure), NYHA class 3 (HCC)    hydrALAZINE (APRESOLINE) 10 MG tablet Take 10 mg by mouth 2 (two) times daily.    Insulin Glargine (TOUJEO SOLOSTAR) 300 UNIT/ML SOPN Inject 7-10 Units into the skin at bedtime.     Insulin Lispro (HUMALOG KWIKPEN) 200 UNIT/ML SOPN Inject 3 Units into the skin 3 (three) times daily as needed (high blood sugar).     isosorbide mononitrate (IMDUR) 30 MG 24 hr tablet Take 0.5 tablets (15 mg total) by mouth daily. Qty: 45 tablet, Refills: 6    levothyroxine (SYNTHROID, LEVOTHROID) 75 MCG tablet Take 1 tablet (75  mcg total) by mouth daily. Qty: 90 tablet, Refills: 1   Associated Diagnoses: TSH elevation    milrinone (PRIMACOR) 20 MG/100ML SOLN infusion Inject 14.575 mcg/min into the vein continuous. Qty: 100 mL, Refills: 0    potassium chloride SA (K-DUR,KLOR-CON) 20 MEQ tablet Take 2 tablets (40 mEq total) by mouth 2 (two) times daily. Qty: 180 tablet, Refills: 0    dextromethorphan-guaiFENesin (ROBITUSSIN-DM) 10-100 MG/5ML liquid Take 10 mLs by mouth every 4 (four) hours as needed for cough. Reported on 02/27/2016    metolazone (ZAROXOLYN) 2.5 MG tablet Take 2.5 mg by mouth daily as needed (blood pressure). Reported on 12/13/2015       Allergies  Allergen Reactions  . Other Hives    STEROIDS  . Prednisone Hives and Other (See Comments)    She is allergic to all steroids!   Follow-up Information    Follow up with Sanda Linger, MD In 1 week.   Specialty:  Internal Medicine   Contact information:   [address] [place] [address] [phone]       Follow up with Arvilla Meres, MD In 3 weeks.   Specialty:  Cardiology   Contact information:   [address] [place] [address] [phone]        The results of significant diagnostics from this hospitalization (including imaging, microbiology, ancillary and laboratory) are listed below for reference.    Significant Diagnostic Studies: Dg Chest Port 1 View  03/25/2016  CLINICAL DATA:  Central catheter placement. Aortic stenosis and mitral regurgitation EXAM: PORTABLE CHEST 1 VIEW COMPARISON:  January 14, 2016 FINDINGS: Central catheter tip is at the cavoatrial junction. No pneumothorax. There is a small left pleural effusion with patchy atelectasis in the left base. Lungs elsewhere clear. There is cardiomegaly with borderline pulmonary venous hypertension. Patient is status post mitral and aortic valve replacements. Pacemaker leads are attached to the right atrium, right ventricle, and left  ventricle. No adenopathy evident. IMPRESSION: Central catheter tip at cavoatrial junction without pneumothorax. Stable cardiomegaly with evidence suggesting a degree of pulmonary vascular congestion. Atelectasis left base with small left effusion. Electronically Signed   By: Bretta Bang III M.D.   On: 03/25/2016 07:17    Microbiology: No results found for this or any previous visit (from the past 240 hour(s)).   Labs: Basic Metabolic Panel:  Recent Labs Lab 03/24/16 2119 03/25/16 0505 03/26/16 0350 03/27/16 0500  NA 136  134* 136 137  K 3.4* 3.3* 2.8* 3.3*  CL 98* 96* 98* 98*  CO2 28 27 29 30   GLUCOSE 266* 295* 201* 204*  BUN 84* 76* 79* 76*  CREATININE 2.10* 2.04* 2.08* 2.35*  CALCIUM 9.7 9.2 9.1 8.8*  MG  --  2.3  --   --    Liver Function Tests:  Recent Labs Lab 03/24/16 2119  AST 35  ALT 46  ALKPHOS 75  BILITOT 1.3*  PROT 7.0  ALBUMIN 4.1   No results for input(s): LIPASE, AMYLASE in the last 168 hours. No results for input(s): AMMONIA in the last 168 hours. CBC:  Recent Labs Lab 03/24/16 2119 03/25/16 0505 03/26/16 0350 03/27/16 0500  WBC 11.5* 8.8 7.1 6.2  NEUTROABS 9.7* 7.3  --   --   HGB 12.7 12.3 11.1* 11.6*  HCT 38.3 37.5 33.2* 34.3*  MCV 91.8 92.6 90.2 90.0  PLT 202 180 198 198   Cardiac Enzymes: No results for input(s): CKTOTAL, CKMB, CKMBINDEX, TROPONINI in the last 168 hours. BNP: BNP (last 3 results)  Recent Labs  03/31/15 0430 01/10/16 1047  BNP 3300.6* 1742.3*    ProBNP (last 3 results) No results for input(s): PROBNP in the last 8760 hours.  CBG:  Recent Labs Lab 03/26/16 1124 03/26/16 1650 03/26/16 2125 03/27/16 0736 03/27/16 1149  GLUCAP 136* 186* 223* 197* 184*       Signed:  Jeralyn Bennett MD.  [facility] 03/27/2016, 1:33 PM

## 2016-03-27 NOTE — Consult Note (Signed)
   [facility] CM Inpatient Consult   03/27/2016  Joanna [name] 1930-03-07 [id number]   Patient screened for [facility] Care Management services. Went to speak with [name] and daughter at bedside. Patient's daughter, [name], states [name] has [facility]. Reports her mother has weekly home visits with the nurse and medications and CHF disease management is reviewed. [name] states that she think she has everything pretty covered when patient goes home. Daughter endorses she weighs daily and writes it down and knows when to contact the MD. [name] also states she monitors her DM closely as well. Reports she has follow appointments next week thus far with the wound care clinic and with the CHF Clinic. [name] also reports they will have an appointment scheduled with the Primary Care MD as well. Appreciative of the visit but pleasantly declines. Provided contact information to contact should they change their minds. Made inpatient RNCM aware.   Raiford Noble, MSN-Ed, RN,BSN [facility] Liaison [phone]

## 2016-03-28 MED FILL — Milrinone Lactate in Dextrose 5% IV Soln 20 MG/100ML: INTRAVENOUS | Qty: 200 | Status: AC

## 2016-03-31 DIAGNOSIS — Z952 Presence of prosthetic heart valve: Secondary | ICD-10-CM | POA: Diagnosis not present

## 2016-03-31 DIAGNOSIS — E1151 Type 2 diabetes mellitus with diabetic peripheral angiopathy without gangrene: Secondary | ICD-10-CM | POA: Diagnosis not present

## 2016-03-31 DIAGNOSIS — I110 Hypertensive heart disease with heart failure: Secondary | ICD-10-CM | POA: Diagnosis not present

## 2016-03-31 DIAGNOSIS — I429 Cardiomyopathy, unspecified: Secondary | ICD-10-CM | POA: Diagnosis not present

## 2016-03-31 DIAGNOSIS — L03116 Cellulitis of left lower limb: Secondary | ICD-10-CM | POA: Diagnosis not present

## 2016-03-31 DIAGNOSIS — I509 Heart failure, unspecified: Secondary | ICD-10-CM | POA: Diagnosis not present

## 2016-03-31 DIAGNOSIS — Z794 Long term (current) use of insulin: Secondary | ICD-10-CM | POA: Diagnosis not present

## 2016-03-31 DIAGNOSIS — I499 Cardiac arrhythmia, unspecified: Secondary | ICD-10-CM | POA: Diagnosis not present

## 2016-03-31 DIAGNOSIS — L97223 Non-pressure chronic ulcer of left calf with necrosis of muscle: Secondary | ICD-10-CM | POA: Diagnosis not present

## 2016-03-31 DIAGNOSIS — E11622 Type 2 diabetes mellitus with other skin ulcer: Secondary | ICD-10-CM | POA: Diagnosis not present

## 2016-04-02 ENCOUNTER — Encounter (HOSPITAL_COMMUNITY): Payer: Self-pay | Admitting: Internal Medicine

## 2016-04-02 ENCOUNTER — Ambulatory Visit (HOSPITAL_COMMUNITY)
Admission: RE | Admit: 2016-04-02 | Discharge: 2016-04-02 | Disposition: A | Discharge status: Home / Self Care | Payer: Medicare Other | Source: Ambulatory Visit | Attending: Internal Medicine | Admitting: Internal Medicine

## 2016-04-02 ENCOUNTER — Encounter: Payer: Self-pay | Admitting: Internal Medicine

## 2016-04-02 VITALS — BP 104/64 | HR 72 | Resp 18 | Wt 128.0 lb

## 2016-04-02 DIAGNOSIS — E1122 Type 2 diabetes mellitus with diabetic chronic kidney disease: Secondary | ICD-10-CM | POA: Insufficient documentation

## 2016-04-02 DIAGNOSIS — Z794 Long term (current) use of insulin: Secondary | ICD-10-CM | POA: Diagnosis not present

## 2016-04-02 DIAGNOSIS — I5022 Chronic systolic (congestive) heart failure: Secondary | ICD-10-CM | POA: Diagnosis present

## 2016-04-02 DIAGNOSIS — Z953 Presence of xenogenic heart valve: Secondary | ICD-10-CM | POA: Insufficient documentation

## 2016-04-02 DIAGNOSIS — Z95810 Presence of automatic (implantable) cardiac defibrillator: Secondary | ICD-10-CM | POA: Diagnosis not present

## 2016-04-02 DIAGNOSIS — I480 Paroxysmal atrial fibrillation: Secondary | ICD-10-CM | POA: Diagnosis not present

## 2016-04-02 DIAGNOSIS — E1151 Type 2 diabetes mellitus with diabetic peripheral angiopathy without gangrene: Secondary | ICD-10-CM | POA: Diagnosis not present

## 2016-04-02 DIAGNOSIS — E785 Hyperlipidemia, unspecified: Secondary | ICD-10-CM | POA: Insufficient documentation

## 2016-04-02 DIAGNOSIS — M1990 Unspecified osteoarthritis, unspecified site: Secondary | ICD-10-CM | POA: Diagnosis not present

## 2016-04-02 DIAGNOSIS — I428 Other cardiomyopathies: Secondary | ICD-10-CM | POA: Diagnosis not present

## 2016-04-02 DIAGNOSIS — N184 Chronic kidney disease, stage 4 (severe): Secondary | ICD-10-CM | POA: Diagnosis not present

## 2016-04-02 DIAGNOSIS — Z7901 Long term (current) use of anticoagulants: Secondary | ICD-10-CM | POA: Diagnosis not present

## 2016-04-02 DIAGNOSIS — I130 Hypertensive heart and chronic kidney disease with heart failure and stage 1 through stage 4 chronic kidney disease, or unspecified chronic kidney disease: Secondary | ICD-10-CM | POA: Diagnosis not present

## 2016-04-02 DIAGNOSIS — Z79899 Other long term (current) drug therapy: Secondary | ICD-10-CM | POA: Diagnosis not present

## 2016-04-02 DIAGNOSIS — Z66 Do not resuscitate: Secondary | ICD-10-CM | POA: Diagnosis not present

## 2016-04-02 DIAGNOSIS — I6521 Occlusion and stenosis of right carotid artery: Secondary | ICD-10-CM | POA: Diagnosis not present

## 2016-04-02 DIAGNOSIS — Z8673 Personal history of transient ischemic attack (TIA), and cerebral infarction without residual deficits: Secondary | ICD-10-CM | POA: Diagnosis not present

## 2016-04-02 NOTE — Progress Notes (Signed)
Error

## 2016-04-02 NOTE — Progress Notes (Signed)
Patient ID: Joanna [name], female   DOB: August 27, 1930, 82 y.o.   MRN: [id number]    Advanced Heart Failure Clinic Note  PCP: Dr Yetta Barre ([facility] [address]) CVTS Surgeon: Dr. Cornelius Moras Primary Cardiologist: Dr. Gala Romney DNR/DNI   HPI: Joanna [name] is a 82 year old woman with aortic stenosis s/p AVR/mitral valve replacement (06/15/2013), systolic HF due to NICM, hypertension, diabetes mellitus type II, hyperlipidemia, and peripheral artery disease.  Underwent AVR/MVR with placement of epicardial lead on 06/15/13 with PAF post-op terminated with amiodarone.  Upgraded to [facility] Jude CRT-D 03/22/2015.    Admitted from 5/27 through 03/30/2015 with acute/chronic systolic HF and cardiogenic shock. She was diuresed with IV lasix and milrinone. Unable to wean milrinone and discharged on 0.25 mcg. While hospitalized CRT-D placed on 6/2. She was discharged to [facility].   Readmitted 6/11 with hypoglycemia. Also had sepsis with HCAP and received antibiotic course. She remained on milrinone 0.375 mcg. Discharge weight was 128 pounds.   Admitted 09/24/15 from facility with fall. Xray showed left hip superior and inferior pubic ramus fracture. She was considered stable from a HF standpoint and compliant with medications. Her weight was 130 lbs, consistent with previous home weights.   Labs from 10/23/15 showed Creatinine had trended up slightly from 1.6 -> 1.8, and in speaking with her daughter pts weight had gone up slowly since going back to [facility] after her hip fracture. Her weight on 10/11/15 was 141 lbs. Appt made for 10/24/15 and instructed to take a metolazone that morning.   Presented to ED 01/14/16 s/p fall. Blood sugar had been dropping. Was in 11s at ER. PCP has adjusted her diabetes regimen. She had struck her left lower leg, X-ray negative for acute findings.   She presents today for follow up. Admitted last week for LE cellulitis. Treated with ceftriaxone and then switched to keflex which she finished yesterday.  Creatinine range 2.0-2.3. Weight at home was 129.5 after discharge. Since being home weight 128-133. Has gotten one dose of metolazone. Remains on milrinone. Fatigued and dyspneic with almost any activity. No orthopnea or PND.   ICD interrogation: No VT. 1 episode AF. Corevue ok.   05/19/2013 ECHO EF 35% severe AS (low gradient severe AS confirmed by DSE), gradient: 32 mm Hg (S). Peak gradient: 57mm Hg  08/18/13 ECHO EF 45-50% AV working well 2/16 ECHO with EF 20-25%, moderately dilated LV with mild LVH, normal bioprosthetic mitral and aortic valves, moderate TR, PA systolic pressure 54 mmHg.  4/16 ECHO EF 20-25%, diffuse hypokinesis, mild LVH, bioprosthetic aortic valve functioning normally, bioprosthetic mitral valve with mild central MR and mean gradient 4 mmHg.   Labs 04/09/2015: K 3.4 Creatinine 2.01  04/16/2915: K 4.2 Creatinine 1.8 Hgb 10.3  06/11/2015: K 4.1 Creatinine 1.9 07/24/15: K 4.5 Creatinine 1.8 WBC 7.2 08/21/2015: K 3.9 Creatinine 1.7   10/22/15 K 2.8, Creatinine 1.82 12/31/15 K 3.2, Creatinine 2.13 01/10/16 K 3.8, Creatinine 2.28 01/14/16 K 3.2, Creatinine 2.00  ROS:  Negative except for as mentioned in HPI and problem list.  Past Medical History  Diagnosis Date  . Peripheral artery disease (HCC)   . Hyperlipidemia        . Arthritis   . Aortic stenosis      a. s/p AVR  . BUNDLE BRANCH BLOCK, LEFT    . CAROTID ARTERY STENOSIS 01/05/2009  . CVA 03/14/2010  . PERIPHERAL VASCULAR DISEASE 07/30/2007  . Chronic combined systolic and diastolic CHF (congestive heart failure) (HCC)  a. RHC 09/2011 RA 8, RV 58/2/11; PA 54/22 (37) PCWP 20; F CO/CI 4.57/2.67 PVR 3.7; b. L main no sig dz, LAD luminal irreg; LCx lg Ramus with luminal irreg, small AV Lcx witout sig dz, RCA luminal irreg; severe mitral annular calcifications; AV calcified c. EF 45-50% (07/2013);  d. 11/2014 Echo: EF 20-25%, diff HK, nl AV/MV, sev dil LA, mod TR/PR, PASP .  . Diabetes mellitus   . HTN (hypertension)    . Iron deficiency anemia 09/21/2011  . History of shingles   . Mitral regurgitation     a. s/p MVR  . S/P aortic valve replacement with bioprosthetic valve 06/15/2013    21 mm [facility] Ease bovine pericardial tissue valve  . S/P mitral valve replacement with bioprosthetic valve 06/15/2013    25 mm [facility] Mitral bovine pericardial tissue valve  . Fracture, fibula, shaft 01/30/2015    right     Current Outpatient Prescriptions  Medication Sig Dispense Refill  . acetaminophen (TYLENOL) 500 MG tablet Take 500 mg by mouth every 6 (six) hours as needed (pain).     Marland Kitchen amiodarone (PACERONE) 200 MG tablet Take 1 tablet (200 mg total) by mouth daily. 90 tablet 3  . apixaban (ELIQUIS) 2.5 MG TABS tablet Take 1 tablet (2.5 mg total) by mouth 2 (two) times daily. 180 tablet 3  . dextromethorphan-guaiFENesin (ROBITUSSIN-DM) 10-100 MG/5ML liquid Take 10 mLs by mouth every 4 (four) hours as needed for cough. Reported on 02/27/2016    . ferrous sulfate 325 (65 FE) MG tablet Take 1 tablet (325 mg total) by mouth 2 (two) times daily with a meal. 180 tablet 1  . furosemide (LASIX) 80 MG tablet Take 1 tablet (80 mg total) by mouth daily. HOLD FOR WEIGHT LESS THAN 125LBS 30 tablet 6  . hydrALAZINE (APRESOLINE) 10 MG tablet Take 10 mg by mouth 2 (two) times daily.    . Insulin Glargine (TOUJEO SOLOSTAR) 300 UNIT/ML SOPN Inject 7-10 Units into the skin at bedtime.     . Insulin Lispro (HUMALOG KWIKPEN) 200 UNIT/ML SOPN Inject 3 Units into the skin 3 (three) times daily as needed (high blood sugar).     . isosorbide mononitrate (IMDUR) 30 MG 24 hr tablet Take 0.5 tablets (15 mg total) by mouth daily. 45 tablet 6  . levothyroxine (SYNTHROID, LEVOTHROID) 75 MCG tablet Take 1 tablet (75 mcg total) by mouth daily. 90 tablet 1  . metolazone (ZAROXOLYN) 2.5 MG tablet Take 2.5 mg by mouth daily as needed (blood pressure). Reported on 12/13/2015    . milrinone (PRIMACOR) 20 MG/100ML SOLN infusion Inject 14.575  mcg/min into the vein continuous. 100 mL 0  . potassium chloride SA (K-DUR,KLOR-CON) 20 MEQ tablet Take 2 tablets (40 mEq total) by mouth 2 (two) times daily. 180 tablet 0   No current facility-administered medications for this encounter.   Social History   Social History  . Marital Status: Divorced    Spouse Name: N/A  . Number of Children: N/A  . Years of Education: N/A   Social History Main Topics  . Smoking status: Never Smoker   . Smokeless tobacco: Never Used  . Alcohol Use: No  . Drug Use: No  . Sexual Activity: No   Other Topics Concern  . None   Social History Narrative   Patient lives alone here in town   She continues to work, helping to bind and oversew books   She is divorced after 23 years of marriage  1 son- '61, minister; 1 dtr '55; 4 grandchildren   End of life care-provided packet on living well   Family History  Problem Relation Age of Onset  . Colon cancer Mother   . Diabetes Neg Hx   . Coronary artery disease Neg Hx     Filed Vitals:   04/02/16 1158  BP: 104/64  Pulse: 72  Resp: 18  Weight: 128 lb (58.06 kg)  SpO2: 100%     Wt Readings from Last 3 Encounters:  04/02/16 128 lb (58.06 kg)  03/27/16 121 lb 7.6 oz (55.1 kg)  03/18/16 125 lb 9.6 oz (56.972 kg)    PHYSICAL EXAM: General: Elderly. NAD; Daughter present. Ambulated into the clinic using cane without difficulty HEENT: Normal Neck: supple. JVP 9-10 cm with mild HJR. Carotids 2+ bilaterally; no bruits. No thyromegaly or nodule noted. Cor: PMI normal. RRR. 1/6 SEM. +S3.    Lungs: Course sounds throughout that clear slightly with cough.  Abdomen: soft, NT, ND, no HSM. No bruits or masses. +BS  Extremities: no cyanosis, clubbing, rash. RUE PICC. Scattered ecchymosis on bilateral LEs. 2+ ankle edema. Cellulitis resolved. Ulcer on back on left leg Neuro: alert & orientedx3, cranial nerves grossly intact. Moves all 4 extremities w/o difficulty. Affect pleasant  ASSESSMENT & PLAN: 1.  Chronic systolic CHF: EF 72-53% (4/16).  NICM.  On chronic milrinone 0.25 mcg. Continue bi-weekly lab work.  - NYHA IIIb on milrinone  - Volume status mildly elevated. Continue lasix 80 mg daily.  - Take metolazone 2.5 as needed to try and keep weight 126-128 Make sure she takes with extra 20 meq of potassium.  - Wear compression stockings - Continue K 40 meq q am 20 meq q pm.  BMET today. - Continue hydralazine 10 mg BID and imdur 15 mg daily.  - No ARB/ACE with AKI and hypotension. 2. Carotid stenosis:  - Dopplers 12/2014 40-59% RICA stenosis stable. Repeat as needed 3. Hyperlipidemia:  - Statin d/c'd in June as non-essential med with DNR/DNI status. - Last lipid check 12/2014. Stable. 4.  Aortic stenosis/mitral regurgitation:  - s/p bioprosthetic AVR/MVR stable last echo 01/2015.  5. CKD stage 4 - Stable 6. PAF:   - in NSR on exam.  - Continue apixaban 2.5 mg BID and amio 200 mg daily.  - No bleeding on Eliquis. 7. St Jude CRT-D  - Sees EP 8. DM, Insulin dependent - Per PCP.  9. CKD stage 4  - check BMET today   Arvilla Meres, MD 12:37 PM

## 2016-04-03 ENCOUNTER — Encounter (HOSPITAL_COMMUNITY): Payer: Self-pay

## 2016-04-03 LAB — BASIC METABOLIC PANEL
BUN: 79 mg/dL — AB (ref 4–21)
Creatinine: 1.9 mg/dL — AB (ref 0.5–1.1)
GLUCOSE: 358 mg/dL
Potassium: 5.2 mmol/L (ref 3.4–5.3)
Sodium: 134 mmol/L — AB (ref 137–147)

## 2016-04-03 LAB — HEPATIC FUNCTION PANEL
ALT: 44 U/L — AB (ref 7–35)
AST: 26 U/L (ref 13–35)
Alkaline Phosphatase: 79 U/L (ref 25–125)
BILIRUBIN DIRECT: 0.26 mg/dL (ref 0.01–0.4)
Bilirubin, Total: 0.6 mg/dL

## 2016-04-03 LAB — CBC AND DIFFERENTIAL
HEMATOCRIT: 37 % (ref 36–46)
HEMOGLOBIN: 11.9 g/dL — AB (ref 12.0–16.0)
Platelets: 218 10*3/uL (ref 150–399)
WBC: 7.9 10*3/mL

## 2016-04-03 NOTE — Progress Notes (Signed)
Labs faxed to PCP for elevated glucose 358.  Ave Filter

## 2016-04-06 ENCOUNTER — Other Ambulatory Visit (HOSPITAL_COMMUNITY): Payer: Self-pay | Admitting: Student

## 2016-04-07 DIAGNOSIS — E11622 Type 2 diabetes mellitus with other skin ulcer: Secondary | ICD-10-CM | POA: Diagnosis not present

## 2016-04-11 ENCOUNTER — Other Ambulatory Visit: Payer: Self-pay | Admitting: Internal Medicine

## 2016-04-11 ENCOUNTER — Other Ambulatory Visit (HOSPITAL_COMMUNITY): Payer: Self-pay | Admitting: Internal Medicine

## 2016-04-14 DIAGNOSIS — E11622 Type 2 diabetes mellitus with other skin ulcer: Secondary | ICD-10-CM | POA: Diagnosis not present

## 2016-04-21 ENCOUNTER — Encounter (HOSPITAL_BASED_OUTPATIENT_CLINIC_OR_DEPARTMENT_OTHER): Payer: Medicare Other | Attending: Internal Medicine

## 2016-04-21 DIAGNOSIS — I872 Venous insufficiency (chronic) (peripheral): Secondary | ICD-10-CM | POA: Diagnosis not present

## 2016-04-21 DIAGNOSIS — Z794 Long term (current) use of insulin: Secondary | ICD-10-CM | POA: Insufficient documentation

## 2016-04-21 DIAGNOSIS — I10 Essential (primary) hypertension: Secondary | ICD-10-CM | POA: Diagnosis not present

## 2016-04-21 DIAGNOSIS — Z952 Presence of prosthetic heart valve: Secondary | ICD-10-CM | POA: Insufficient documentation

## 2016-04-21 DIAGNOSIS — E11622 Type 2 diabetes mellitus with other skin ulcer: Secondary | ICD-10-CM | POA: Diagnosis not present

## 2016-04-21 DIAGNOSIS — I429 Cardiomyopathy, unspecified: Secondary | ICD-10-CM | POA: Diagnosis not present

## 2016-04-21 DIAGNOSIS — L97222 Non-pressure chronic ulcer of left calf with fat layer exposed: Secondary | ICD-10-CM | POA: Diagnosis not present

## 2016-04-21 DIAGNOSIS — I509 Heart failure, unspecified: Secondary | ICD-10-CM | POA: Diagnosis not present

## 2016-04-22 ENCOUNTER — Other Ambulatory Visit (HOSPITAL_COMMUNITY): Payer: Self-pay | Admitting: Internal Medicine

## 2016-04-22 ENCOUNTER — Other Ambulatory Visit: Payer: Self-pay | Admitting: Internal Medicine

## 2016-04-25 ENCOUNTER — Other Ambulatory Visit (HOSPITAL_COMMUNITY): Payer: Self-pay | Admitting: Internal Medicine

## 2016-04-28 DIAGNOSIS — E11622 Type 2 diabetes mellitus with other skin ulcer: Secondary | ICD-10-CM | POA: Diagnosis not present

## 2016-05-05 DIAGNOSIS — E11622 Type 2 diabetes mellitus with other skin ulcer: Secondary | ICD-10-CM | POA: Diagnosis not present

## 2016-05-09 ENCOUNTER — Other Ambulatory Visit (HOSPITAL_COMMUNITY): Payer: Self-pay | Admitting: Internal Medicine

## 2016-05-12 ENCOUNTER — Encounter (HOSPITAL_COMMUNITY): Payer: Self-pay | Admitting: Internal Medicine

## 2016-05-12 ENCOUNTER — Ambulatory Visit (HOSPITAL_COMMUNITY)
Admission: RE | Admit: 2016-05-12 | Discharge: 2016-05-12 | Disposition: A | Discharge status: Home / Self Care | Payer: Medicare Other | Source: Ambulatory Visit | Attending: Internal Medicine | Admitting: Internal Medicine

## 2016-05-12 VITALS — BP 112/66 | HR 83 | Wt 128.5 lb

## 2016-05-12 DIAGNOSIS — Z66 Do not resuscitate: Secondary | ICD-10-CM | POA: Diagnosis not present

## 2016-05-12 DIAGNOSIS — I428 Other cardiomyopathies: Secondary | ICD-10-CM | POA: Diagnosis not present

## 2016-05-12 DIAGNOSIS — N184 Chronic kidney disease, stage 4 (severe): Secondary | ICD-10-CM | POA: Diagnosis not present

## 2016-05-12 DIAGNOSIS — I739 Peripheral vascular disease, unspecified: Secondary | ICD-10-CM | POA: Insufficient documentation

## 2016-05-12 DIAGNOSIS — E1122 Type 2 diabetes mellitus with diabetic chronic kidney disease: Secondary | ICD-10-CM | POA: Diagnosis not present

## 2016-05-12 DIAGNOSIS — E785 Hyperlipidemia, unspecified: Secondary | ICD-10-CM | POA: Diagnosis not present

## 2016-05-12 DIAGNOSIS — I480 Paroxysmal atrial fibrillation: Secondary | ICD-10-CM

## 2016-05-12 DIAGNOSIS — I5022 Chronic systolic (congestive) heart failure: Secondary | ICD-10-CM | POA: Diagnosis present

## 2016-05-12 DIAGNOSIS — Z79899 Other long term (current) drug therapy: Secondary | ICD-10-CM | POA: Insufficient documentation

## 2016-05-12 DIAGNOSIS — Z95810 Presence of automatic (implantable) cardiac defibrillator: Secondary | ICD-10-CM | POA: Diagnosis not present

## 2016-05-12 DIAGNOSIS — Z953 Presence of xenogenic heart valve: Secondary | ICD-10-CM

## 2016-05-12 DIAGNOSIS — I130 Hypertensive heart and chronic kidney disease with heart failure and stage 1 through stage 4 chronic kidney disease, or unspecified chronic kidney disease: Secondary | ICD-10-CM | POA: Diagnosis not present

## 2016-05-12 DIAGNOSIS — Z7901 Long term (current) use of anticoagulants: Secondary | ICD-10-CM | POA: Diagnosis not present

## 2016-05-12 NOTE — Progress Notes (Signed)
Medication Samples have been provided to the patient.  Drug name: Eliquis       Strength: 2.5 mg        Qty: 4 boxes  LOT: [id number]  Exp.Date: 9/18  Dosing instructions: 1 tab Twice daily   The patient has been instructed regarding the correct time, dose, and frequency of taking this medication, including desired effects and most common side effects.   Daimen Shovlin 11:33 AM 05/12/2016

## 2016-05-12 NOTE — Patient Instructions (Signed)
Your physician recommends that you schedule a follow-up appointment in: 2 months with echocardiogram  

## 2016-05-12 NOTE — Progress Notes (Signed)
Patient ID: Joanna [name], female   DOB: [birth year], 82 y.o   MRN: [id number]    Advanced Heart Failure Clinic Note   PCP: Dr Yetta Barre ([facility]) CVTS Surgeon: Dr. Cornelius Moras Primary Cardiologist: Dr. Gala Romney DNR/DNI   HPI: Joanna [name] is a 82 year old woman with aortic stenosis s/p AVR/mitral valve replacement (06/15/2013), systolic HF due to NICM, hypertension, diabetes mellitus type II, hyperlipidemia, and peripheral artery disease.  Underwent AVR/MVR with placement of epicardial lead on 06/15/13 with PAF post-op terminated with amiodarone.  Upgraded to [facility] Jude CRT-D 03/22/2015.    Admitted from 5/27 through 03/30/2015 with acute/chronic systolic HF and cardiogenic shock. She was diuresed with IV lasix and milrinone. Unable to wean milrinone and discharged on 0.25 mcg. While hospitalized CRT-D placed on 6/2. She was discharged to [facility].   Readmitted 6/11 with hypoglycemia. Also had sepsis with HCAP and received antibiotic course. She remained on milrinone 0.375 mcg. Discharge weight was 128 pounds.   Admitted 09/24/15 from facility with fall. Xray showed left hip superior and inferior pubic ramus fracture. She was considered stable from a HF standpoint and compliant with medications. Her weight was 130 lbs, consistent with previous home weights.   Labs from 10/23/15 showed Creatinine had trended up slightly from 1.6 -> 1.8, and in speaking with her daughter pts weight had gone up slowly since going back to [facility] after her hip fracture. Her weight on 10/11/15 was 141 lbs. Appt made for 10/24/15 and instructed to take a metolazone that morning.   Presented to ED 01/14/16 s/p fall. Blood sugar had been dropping. Was in 55s at ER. PCP has adjusted her diabetes regimen. She had struck her left lower leg, X-ray negative for acute findings.   Recently treated with abx for cellulitis. [facility]  She presents today for regular follow up. Remains on chronic milrinone 0.25 mcg/kg/min  with biweekly labs. Volume status has been up and down. Weight ranges from 127 to 132. Today she is 128. Daughter following volume status very closely. Takes metolazone about 2x/week as needed. Breathing better. However more fatigued. Edema better controlled. Wound has healed  ICD interrogation: Optivol down. NO VT/AF  05/19/2013 ECHO EF 35% severe AS (low gradient severe AS confirmed by DSE), gradient: 32 mm Hg (S). Peak gradient: 18mm Hg  08/18/13 ECHO EF 45-50% AV working well 2/16 ECHO with EF 20-25%, moderately dilated LV with mild LVH, normal bioprosthetic mitral and aortic valves, moderate TR, PA systolic pressure 54 mmHg.  4/16 ECHO EF 20-25%, diffuse hypokinesis, mild LVH, bioprosthetic aortic valve functioning normally, bioprosthetic mitral valve with mild central MR and mean gradient 4 mmHg.   Labs 06/11/2015: K 4.1 Creatinine 1.9 08/21/2015: K 3.9 Creatinine 1.7   10/22/15 K 2.8, Creatinine 1.82 12/31/15 K 3.2, Creatinine 2.13 01/10/16 K 3.8, Creatinine 2.28 05/01/16 K 4.1, Creatinine 1.88   ROS:  Negative except for as mentioned in HPI and problem list.  Past Medical History:  Diagnosis Date  . Aortic stenosis     a. s/p AVR  . Arthritis   . BUNDLE BRANCH BLOCK, LEFT    . CAROTID ARTERY STENOSIS 01/05/2009  . Chronic combined systolic and diastolic CHF (congestive heart failure) (HCC)    a. RHC 09/2011 RA 8, RV 58/2/11; PA 54/22 (37) PCWP 20; F CO/CI 4.57/2.67 PVR 3.7; b. L main no sig dz, LAD luminal irreg; LCx lg Ramus with luminal irreg, small AV Lcx witout sig dz, RCA luminal irreg; severe mitral annular  calcifications; AV calcified c. EF 45-50% (07/2013);  d. 11/2014 Echo: EF 20-25%, diff HK, nl AV/MV, sev dil LA, mod TR/PR, PASP .  Marland Kitchen CVA 03/14/2010  . Diabetes mellitus   . Fracture, fibula, shaft 01/30/2015   right  . History of shingles   . HTN (hypertension)   . Hyperlipidemia       . Iron deficiency anemia 09/21/2011  . Mitral regurgitation    a. s/p MVR  .  Peripheral artery disease (HCC)   . PERIPHERAL VASCULAR DISEASE 07/30/2007  . S/P aortic valve replacement with bioprosthetic valve 06/15/2013   21 mm [facility] Ease bovine pericardial tissue valve  . S/P mitral valve replacement with bioprosthetic valve 06/15/2013   25 mm [facility] Mitral bovine pericardial tissue valve     Current Outpatient Prescriptions  Medication Sig Dispense Refill  . amiodarone (PACERONE) 200 MG tablet Take 1 tablet (200 mg total) by mouth daily. 90 tablet 3  . apixaban (ELIQUIS) 2.5 MG TABS tablet Take 1 tablet (2.5 mg total) by mouth 2 (two) times daily. 180 tablet 3  . ferrous sulfate 325 (65 FE) MG tablet Take 1 tablet (325 mg total) by mouth 2 (two) times daily with a meal. 180 tablet 1  . furosemide (LASIX) 80 MG tablet Take 1 tablet (80 mg total) by mouth daily. HOLD FOR WEIGHT LESS THAN 125LBS 30 tablet 6  . furosemide (LASIX) 80 MG tablet Take 1 tablet by mouth  daily 90 tablet 3  . hydrALAZINE (APRESOLINE) 10 MG tablet Take 10 mg by mouth 2 (two) times daily.    . Insulin Glargine (TOUJEO SOLOSTAR) 300 UNIT/ML SOPN Inject 7-10 Units into the skin at bedtime.     . Insulin Lispro (HUMALOG KWIKPEN) 200 UNIT/ML SOPN Inject 3 Units into the skin 3 (three) times daily as needed (high blood sugar).     . isosorbide mononitrate (IMDUR) 30 MG 24 hr tablet Take 0.5 tablets (15 mg total) by mouth daily. 45 tablet 6  . levothyroxine (SYNTHROID, LEVOTHROID) 75 MCG tablet Take 1 tablet by mouth  daily 90 tablet 1  . metolazone (ZAROXOLYN) 2.5 MG tablet Take 2.5 mg by mouth daily as needed (blood pressure). Reported on 12/13/2015    . milrinone (PRIMACOR) 20 MG/100ML SOLN infusion Inject 14.575 mcg/min into the vein continuous. 100 mL 0  . potassium chloride SA (K-DUR,KLOR-CON) 20 MEQ tablet Take 2 tablets by mouth two times daily 180 tablet 3  . acetaminophen (TYLENOL) 500 MG tablet Take 500 mg by mouth every 6 (six) hours as needed (pain).     Marland Kitchen  dextromethorphan-guaiFENesin (ROBITUSSIN-DM) 10-100 MG/5ML liquid Take 10 mLs by mouth every 4 (four) hours as needed for cough. Reported on 02/27/2016     No current facility-administered medications for this encounter.    Social History   Social History  . Marital status: Divorced    Spouse name: N/A  . Number of children: N/A  . Years of education: N/A   Social History Main Topics  . Smoking status: Never Smoker  . Smokeless tobacco: Never Used  . Alcohol use No  . Drug use: No  . Sexual activity: No   Other Topics Concern  . None   Social History Narrative   Patient lives alone here in town   She continues to work, helping to bind and oversew books   She is divorced after 23 years of marriage   1 son- '61, minister; 1 dtr '55; 4 grandchildren   End  of life care-provided packet on living well   Family History  Problem Relation Age of Onset  . Colon cancer Mother   . Diabetes Neg Hx   . Coronary artery disease Neg Hx     Vitals:   05/12/16 1048  BP: 112/66  Pulse: 83  SpO2: 99%  Weight: 128 lb 8 oz (58.3 kg)     Wt Readings from Last 3 Encounters:  05/12/16 128 lb 8 oz (58.3 kg)  04/02/16 128 lb (58.1 kg)  03/27/16 121 lb 7.6 oz (55.1 kg)    PHYSICAL EXAM: General: Elderly. NAD; Daughter present. Ambulated into the clinic using cane without difficulty HEENT: Normal Neck: supple. JVP 7 cm with mild HJR. Carotids 2+ bilaterally; no bruits. No thyromegaly or nodule noted. Cor: PMI normal. RRR. 1/6 SEM. +S3.    Lungs: Clear Abdomen: soft, non-tender, non-distended, no HSM. No bruits or masses. +BS  Extremities: no cyanosis, clubbing, rash. RUE PICC. trace ankle edema.  Neuro: alert & orientedx3, cranial nerves grossly intact. Moves all 4 extremities w/o difficulty. Affect pleasant  ASSESSMENT & PLAN: 1. Chronic systolic CHF: EF 16-10% (4/16).  NICM.  On chronic milrinone 0.25 mcg. Continue bi-weekly lab work.  - Stable NYHA III-IIIb on milrinone  - Volume  status looks good today. Continue lasix 80 mg daily.  - Take metolazone 2.5 as needed to try and keep weight 128-129 Make sure she takes with extra 20 meq of potassium.  - Wear compression stockings - Continue hydralazine 10 mg BID and imdur 15 mg daily.  - No BB, ARB/ACE with AKI and hypotension. - Echo at next visit in 2 months 2. Carotid stenosis:  - Dopplers 12/2014 40-59% RICA stenosis stable. Repeat as needed 3. Hyperlipidemia:  - Statin d/c'd in June as non-essential med with DNR/DNI status. - Last lipid check 12/2014. Stable. 4.  Aortic stenosis/mitral regurgitation:  - s/p bioprosthetic AVR/MVR stable last echo 01/2015.  - repeat echo in 2 months 5. CKD stage 4 - Stable. AHC following with routine labs. Watch closely with diuretic changes 6. PAF:   - in NSR on exam.  - Continue apixaban 2.5 mg BID and amio 200 mg daily.  - No bleeding on Eliquis. 7. St Jude CRT-D  - Sees EP 8. DM, Insulin dependent - Per PCP.   Bensimhon, Daniel,MD 11:19 AM

## 2016-05-16 ENCOUNTER — Telehealth (HOSPITAL_COMMUNITY): Payer: Self-pay

## 2016-05-16 NOTE — Telephone Encounter (Signed)
Patient's recent faxed in labs drawn on 05/15/16 stable with potassium slightly elevated at 5.0. Per Otilio Saber PA-C, advised to cut back daily dose of potassium to 20 meq once daily. Reviewed labs and instructions with daughter, aware and agreeable. Will come 05/26/16 for repeat bmet.  Ave Filter

## 2016-05-18 IMAGING — DX DG ANKLE COMPLETE 3+V*R*
3 series · 3 of 3 positions shown · non-contrast
Comparison: 01/30/2015

CLINICAL DATA: Post right ankle surgery 6 weeks ago.

EXAM:
RIGHT ANKLE - COMPLETE 3+ VIEW

[ankle ap]
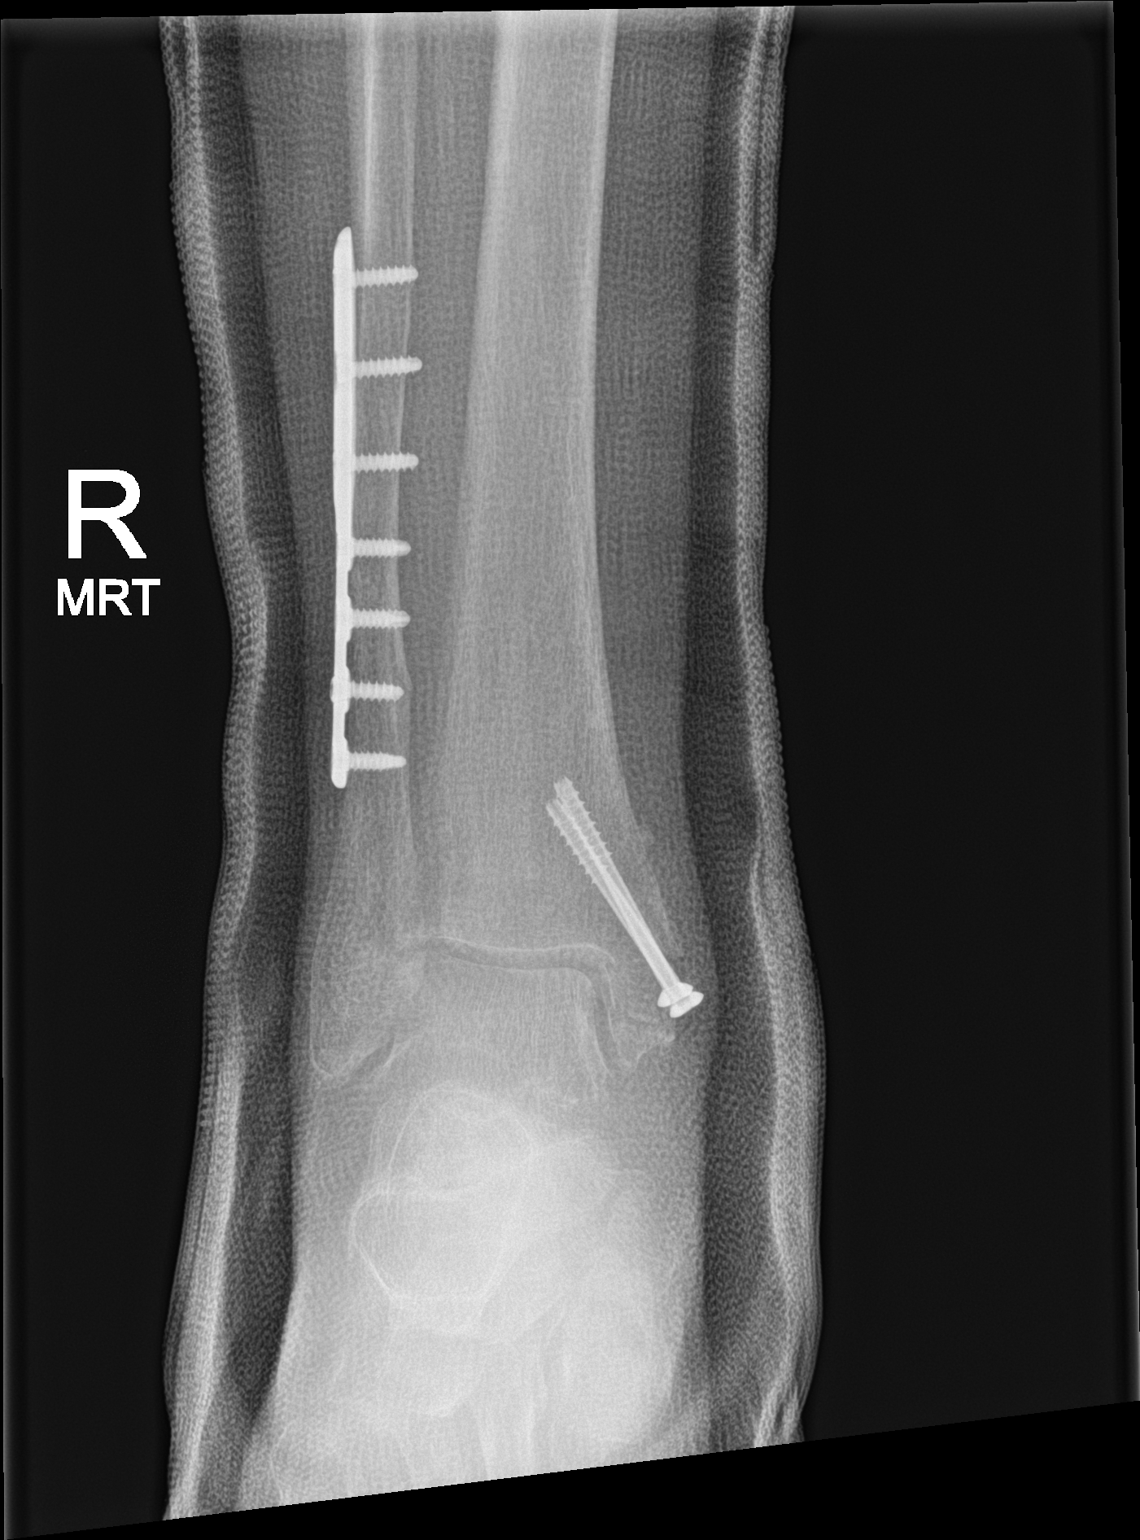

[ankle obl]
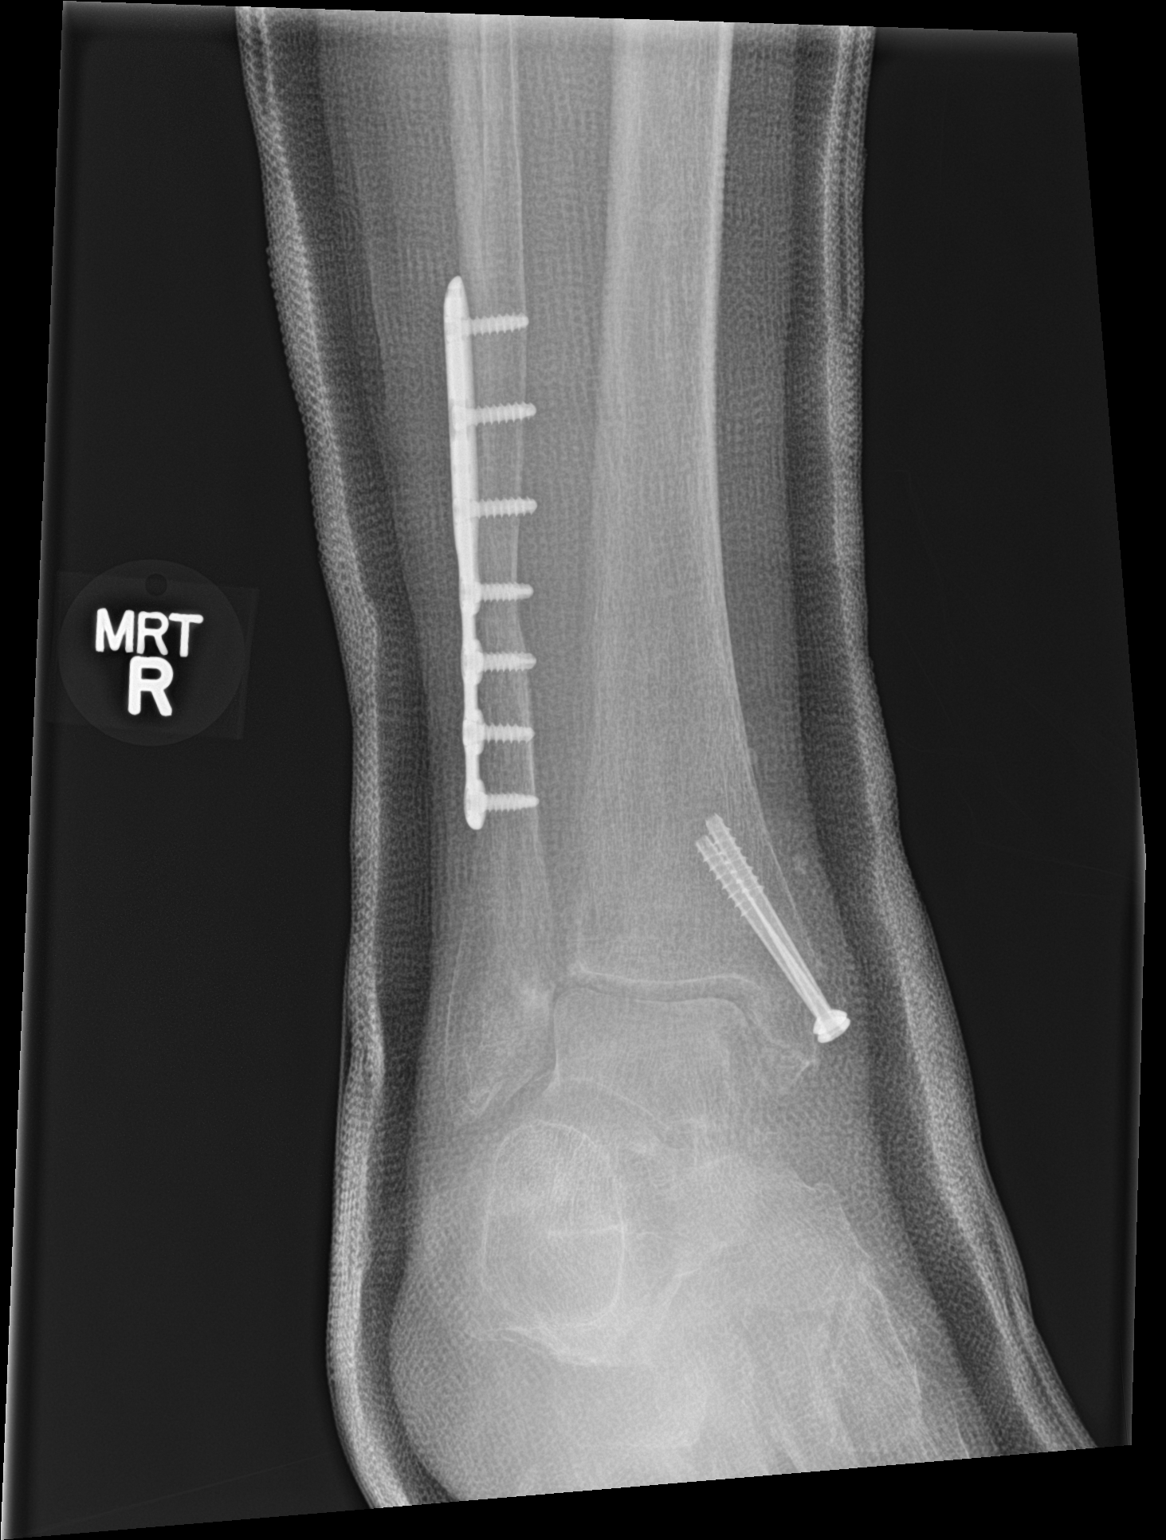

[ankle lat]
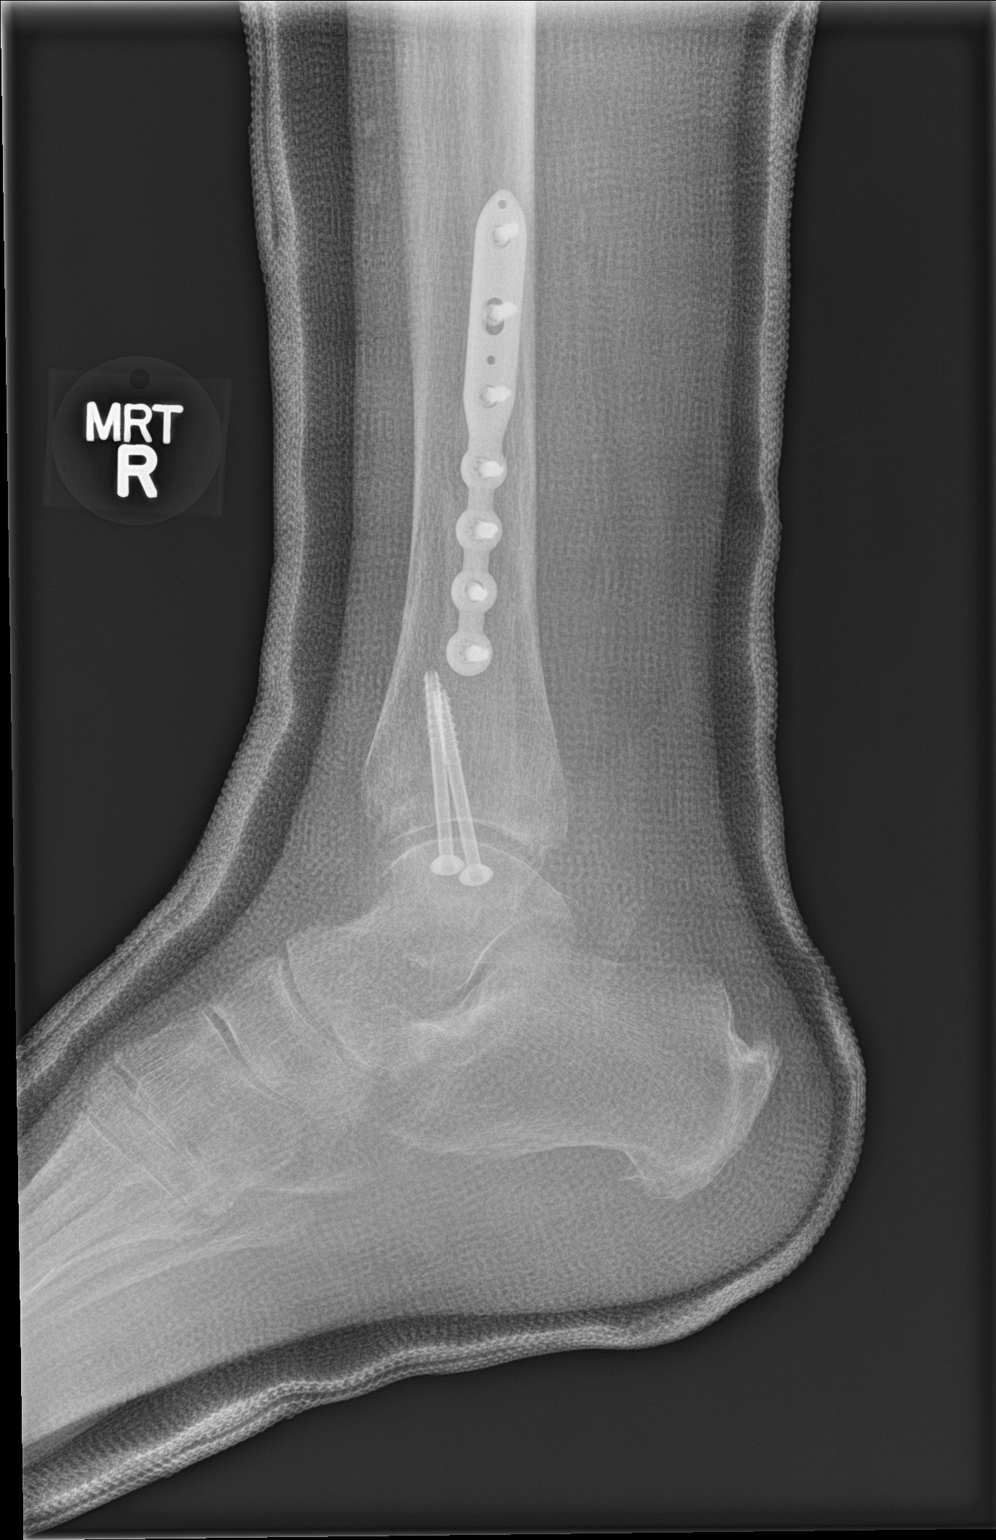

[3 of 3 positions shown; findings below may reference images not displayed]

FINDINGS: Plate and screw fixation device noted across the distal fibular
shaft fracture. Medial malleolar screws in place across the medial
malleolar fractures. Anatomic alignment. No acute bony abnormality.
Fracture lines are difficult to visualize. Fine bony detail is
obscured by overlying cast material.
IMPRESSION: Evidence of healing across the distal fibular shaft fracture and
medial malleolar fractures. Fine bony detail obscured by overlying
cast.

## 2016-05-26 ENCOUNTER — Ambulatory Visit (HOSPITAL_COMMUNITY)
Admission: RE | Admit: 2016-05-26 | Discharge: 2016-05-26 | Disposition: A | Discharge status: Home / Self Care | Payer: Medicare Other | Source: Ambulatory Visit | Attending: Internal Medicine | Admitting: Internal Medicine

## 2016-05-26 DIAGNOSIS — I5022 Chronic systolic (congestive) heart failure: Secondary | ICD-10-CM | POA: Diagnosis not present

## 2016-05-26 LAB — CBC
HCT: 36.4 % (ref 36.0–46.0)
Hemoglobin: 11.8 g/dL — ABNORMAL LOW (ref 12.0–15.0)
MCH: 30.1 pg (ref 26.0–34.0)
MCHC: 32.4 g/dL (ref 30.0–36.0)
MCV: 92.9 fL (ref 78.0–100.0)
PLATELETS: 217 10*3/uL (ref 150–400)
RBC: 3.92 MIL/uL (ref 3.87–5.11)
RDW: 15 % (ref 11.5–15.5)
WBC: 9.3 10*3/uL (ref 4.0–10.5)

## 2016-05-26 LAB — BASIC METABOLIC PANEL
ANION GAP: 12 (ref 5–15)
BUN: 68 mg/dL — ABNORMAL HIGH (ref 6–20)
CALCIUM: 9.2 mg/dL (ref 8.9–10.3)
CHLORIDE: 93 mmol/L — AB (ref 101–111)
CO2: 31 mmol/L (ref 22–32)
CREATININE: 1.98 mg/dL — AB (ref 0.44–1.00)
GFR calc non Af Amer: 22 mL/min — ABNORMAL LOW (ref >60)
GFR, EST AFRICAN AMERICAN: 26 mL/min — AB (ref >60)
Glucose, Bld: 202 mg/dL — ABNORMAL HIGH (ref 65–99)
POTASSIUM: 2.9 mmol/L — AB (ref 3.5–5.1)
SODIUM: 136 mmol/L (ref 135–145)

## 2016-05-26 LAB — MAGNESIUM: MAGNESIUM: 2.3 mg/dL (ref 1.7–2.4)

## 2016-05-26 NOTE — Addendum Note (Signed)
Encounter addended by: Theresia Bough, CMA on: 05/26/2016 10:01 AM<BR>    Actions taken: Order Entry activity accessed, Diagnosis association updated

## 2016-05-30 ENCOUNTER — Other Ambulatory Visit (HOSPITAL_COMMUNITY): Payer: Self-pay | Admitting: Internal Medicine

## 2016-06-02 ENCOUNTER — Other Ambulatory Visit (HOSPITAL_COMMUNITY): Payer: Self-pay | Admitting: Internal Medicine

## 2016-06-03 NOTE — Telephone Encounter (Signed)
Daughter called chf clinic triage line to request refill on amiodarone. Refilled to preferred pharmacy electronically.  Elige Radon, Bettina Gavia, RN

## 2016-06-06 ENCOUNTER — Other Ambulatory Visit (HOSPITAL_COMMUNITY): Payer: Self-pay | Admitting: Internal Medicine

## 2016-06-13 LAB — CUP PACEART INCLINIC DEVICE CHECK
Battery Voltage: 2.95 V
Brady Statistic RA Percent Paced: 1 % — CL
Date Time Interrogation Session: 20170825110221
Implantable Lead Location: 753858
Implantable Lead Location: 753859
Implantable Lead Location: 753860
Lead Channel Pacing Threshold Amplitude: 1.25 V
Lead Channel Pacing Threshold Pulse Width: 0.4 ms
Lead Channel Pacing Threshold Pulse Width: 0.5 ms
Lead Channel Setting Pacing Amplitude: 2 V
Lead Channel Setting Pacing Amplitude: 2 V
Lead Channel Setting Pacing Pulse Width: 0.5 ms
MDC IDC LEAD IMPLANT DT: 20160601
MDC IDC LEAD IMPLANT DT: 20160601
MDC IDC LEAD IMPLANT DT: 20160601
MDC IDC MSMT LEADCHNL LV PACING THRESHOLD AMPLITUDE: 0.75 V
MDC IDC MSMT LEADCHNL LV PACING THRESHOLD PULSEWIDTH: 0.5 ms
MDC IDC MSMT LEADCHNL RA SENSING INTR AMPL: 2.4 mV
MDC IDC MSMT LEADCHNL RV PACING THRESHOLD AMPLITUDE: 1.25 V
MDC IDC MSMT LEADCHNL RV SENSING INTR AMPL: 12 mV
MDC IDC SET LEADCHNL RV PACING AMPLITUDE: 2 V
MDC IDC SET LEADCHNL RV PACING PULSEWIDTH: 0.5 ms
MDC IDC STAT BRADY RV PERCENT PACED: 95 %
Pulse Gen Model: 3242
Pulse Gen Serial Number: 7705940

## 2016-06-17 ENCOUNTER — Ambulatory Visit (INDEPENDENT_AMBULATORY_CARE_PROVIDER_SITE_OTHER): Payer: Medicare Other | Admitting: *Deleted

## 2016-06-17 DIAGNOSIS — I428 Other cardiomyopathies: Secondary | ICD-10-CM

## 2016-06-17 DIAGNOSIS — Z950 Presence of cardiac pacemaker: Secondary | ICD-10-CM | POA: Diagnosis not present

## 2016-06-17 DIAGNOSIS — I480 Paroxysmal atrial fibrillation: Secondary | ICD-10-CM

## 2016-06-18 NOTE — Progress Notes (Signed)
Remote pacemaker transmission.   

## 2016-06-19 ENCOUNTER — Encounter: Payer: Self-pay | Admitting: Cardiology

## 2016-06-20 ENCOUNTER — Other Ambulatory Visit (HOSPITAL_COMMUNITY): Payer: Self-pay | Admitting: Internal Medicine

## 2016-07-02 ENCOUNTER — Other Ambulatory Visit (INDEPENDENT_AMBULATORY_CARE_PROVIDER_SITE_OTHER): Payer: Medicare Other

## 2016-07-02 ENCOUNTER — Ambulatory Visit (INDEPENDENT_AMBULATORY_CARE_PROVIDER_SITE_OTHER): Payer: Medicare Other | Admitting: Internal Medicine

## 2016-07-02 ENCOUNTER — Encounter: Payer: Self-pay | Admitting: Internal Medicine

## 2016-07-02 VITALS — BP 102/56 | HR 79 | Temp 97.8°F | Resp 16 | Ht 65.0 in | Wt 130.2 lb

## 2016-07-02 DIAGNOSIS — N181 Chronic kidney disease, stage 1: Secondary | ICD-10-CM

## 2016-07-02 DIAGNOSIS — I6523 Occlusion and stenosis of bilateral carotid arteries: Secondary | ICD-10-CM

## 2016-07-02 DIAGNOSIS — Z794 Long term (current) use of insulin: Secondary | ICD-10-CM | POA: Diagnosis not present

## 2016-07-02 DIAGNOSIS — Z23 Encounter for immunization: Secondary | ICD-10-CM

## 2016-07-02 DIAGNOSIS — R946 Abnormal results of thyroid function studies: Secondary | ICD-10-CM | POA: Diagnosis not present

## 2016-07-02 DIAGNOSIS — N184 Chronic kidney disease, stage 4 (severe): Secondary | ICD-10-CM

## 2016-07-02 DIAGNOSIS — I10 Essential (primary) hypertension: Secondary | ICD-10-CM

## 2016-07-02 DIAGNOSIS — E0822 Diabetes mellitus due to underlying condition with diabetic chronic kidney disease: Secondary | ICD-10-CM

## 2016-07-02 DIAGNOSIS — I480 Paroxysmal atrial fibrillation: Secondary | ICD-10-CM

## 2016-07-02 DIAGNOSIS — R7989 Other specified abnormal findings of blood chemistry: Secondary | ICD-10-CM

## 2016-07-02 LAB — BASIC METABOLIC PANEL
BUN: 50 mg/dL — ABNORMAL HIGH (ref 6–23)
CHLORIDE: 99 meq/L (ref 96–112)
CO2: 30 meq/L (ref 19–32)
Calcium: 9.2 mg/dL (ref 8.4–10.5)
Creatinine, Ser: 1.97 mg/dL — ABNORMAL HIGH (ref 0.40–1.20)
GFR: 25.68 mL/min — ABNORMAL LOW (ref >60.00)
Glucose, Bld: 228 mg/dL — ABNORMAL HIGH (ref 70–99)
POTASSIUM: 3.7 meq/L (ref 3.5–5.1)
SODIUM: 140 meq/L (ref 135–145)

## 2016-07-02 LAB — HEMOGLOBIN A1C: Hgb A1c MFr Bld: 7.2 % — ABNORMAL HIGH (ref 4.6–6.5)

## 2016-07-02 LAB — MAGNESIUM: MAGNESIUM: 2.1 mg/dL (ref 1.5–2.5)

## 2016-07-02 MED ORDER — APIXABAN 2.5 MG PO TABS: 2.5000 mg | ORAL_TABLET | Freq: Two times a day (BID) | ORAL | 1 refills | Status: AC

## 2016-07-02 NOTE — Progress Notes (Signed)
Pre visit review using our clinic review tool, if applicable. No additional management support is needed unless otherwise documented below in the visit note. 

## 2016-07-02 NOTE — Progress Notes (Signed)
Subjective:  Patient ID: Joanna [name], female    DOB: 07/29/1930  Age: 82 y.o. MRN: [id number]  CC: Hypothyroidism; Diabetes; and Congestive Heart Failure   HPI Joanna [name] presents for ollow-up on the above medical problems. She continues to have wide swings in her blood sugar related to what she is eating. She recently ate [place] food and later that day her blood sugar went up to 453, her daughter gave her 9 units of insulin and a few hours later it was down to 54. She is asymptomatic with these changes in blood sugars. She tells me that her dyspnea on exertion is better than it was a few months ago and she has very minimal edema around her ankles. She's had no recent episodes of chest pain, dizziness, lightheadedness, palpitations, or syncope.  Outpatient Medications Prior to Visit  Medication Sig Dispense Refill  . acetaminophen (TYLENOL) 500 MG tablet Take 500 mg by mouth every 6 (six) hours as needed (pain).     Marland Kitchen. amiodarone (PACERONE) 200 MG tablet Take 1 tablet (200 mg total) by mouth daily. 90 tablet 3  . dextromethorphan-guaiFENesin (ROBITUSSIN-DM) 10-100 MG/5ML liquid Take 10 mLs by mouth every 4 (four) hours as needed for cough. Reported on 02/27/2016    . ferrous sulfate 325 (65 FE) MG tablet Take 1 tablet (325 mg total) by mouth 2 (two) times daily with a meal. 180 tablet 1  . furosemide (LASIX) 80 MG tablet Take 1 tablet (80 mg total) by mouth daily. HOLD FOR WEIGHT LESS THAN 125LBS 30 tablet 6  . hydrALAZINE (APRESOLINE) 10 MG tablet Take 10 mg by mouth 2 (two) times daily.    . Insulin Glargine (TOUJEO SOLOSTAR) 300 UNIT/ML SOPN Inject 7-10 Units into the skin at bedtime.     . Insulin Lispro (HUMALOG KWIKPEN) 200 UNIT/ML SOPN Inject 3 Units into the skin 3 (three) times daily as needed (high blood sugar). Sliding scale    . isosorbide mononitrate (IMDUR) 30 MG 24 hr tablet Take 0.5 tablets (15 mg total) by mouth daily. 45 tablet 6  . levothyroxine (SYNTHROID, LEVOTHROID)  75 MCG tablet Take 1 tablet by mouth  daily 90 tablet 1  . metolazone (ZAROXOLYN) 2.5 MG tablet Take 2.5 mg by mouth daily as needed (blood pressure). Reported on 12/13/2015. Pt only used if there is a more than 3lb weight gain.    . milrinone (PRIMACOR) 20 MG/100ML SOLN infusion Inject 14.575 mcg/min into the vein continuous. 100 mL 0  . potassium chloride SA (K-DUR,KLOR-CON) 20 MEQ tablet Take 2 tablets by mouth two times daily 180 tablet 3  . amiodarone (PACERONE) 200 MG tablet Take 1 tablet (200 mg total) by mouth daily. 30 tablet 6  . apixaban (ELIQUIS) 2.5 MG TABS tablet Take 1 tablet (2.5 mg total) by mouth 2 (two) times daily. 180 tablet 3  . furosemide (LASIX) 80 MG tablet Take 1 tablet by mouth  daily 90 tablet 3   No facility-administered medications prior to visit.     ROS Review of Systems  Constitutional: Positive for fatigue. Negative for appetite change, chills, diaphoresis, fever and unexpected weight change.  HENT: Negative.   Eyes: Negative.   Respiratory: Positive for shortness of breath. Negative for cough, choking, chest tightness, wheezing and stridor.   Cardiovascular: Positive for leg swelling. Negative for chest pain and palpitations.  Gastrointestinal: Negative.  Negative for abdominal pain, constipation, diarrhea, nausea and vomiting.  Endocrine: Negative for polydipsia, polyphagia and polyuria.  Genitourinary: Negative.  Negative for difficulty urinating.  Musculoskeletal: Negative.   Skin: Negative.   Allergic/Immunologic: Negative.   Neurological: Negative.  Negative for dizziness, weakness, light-headedness and numbness.  Hematological: Negative.  Negative for adenopathy. Does not bruise/bleed easily.  Psychiatric/Behavioral: Negative.     Objective:  BP (!) 102/56 (BP Location: Left Arm, Patient Position: Sitting, Cuff Size: Normal)   Pulse 79   Temp 97.8 F (36.6 C) (Oral)   Resp 16   Ht 5\' 5"  (1.651 m)   Wt 130 lb 4 oz (59.1 kg)   SpO2 97%   BMI  21.67 kg/m   BP Readings from Last 3 Encounters:  07/02/16 (!) 102/56  05/12/16 112/66  04/02/16 104/64    Wt Readings from Last 3 Encounters:  07/02/16 130 lb 4 oz (59.1 kg)  05/12/16 128 lb 8 oz (58.3 kg)  04/02/16 128 lb (58.1 kg)    Physical Exam  Constitutional: She is oriented to person, place, and time. No distress.  HENT:  Mouth/Throat: Oropharynx is clear and moist. No oropharyngeal exudate.  Eyes: Conjunctivae are normal. Right eye exhibits no discharge. Left eye exhibits no discharge. No scleral icterus.  Neck: Normal range of motion. Neck supple. No JVD present. No tracheal deviation present. No thyromegaly present.  Cardiovascular: Normal rate, regular rhythm, normal heart sounds and intact distal pulses.  Exam reveals no gallop and no friction rub.   No murmur heard. Pulmonary/Chest: Effort normal and breath sounds normal. No stridor. No respiratory distress. She has no wheezes. She has no rales. She exhibits no tenderness.  Abdominal: Soft. Bowel sounds are normal. She exhibits no distension and no mass. There is no tenderness. There is no rebound and no guarding.  Musculoskeletal: Normal range of motion. She exhibits edema. She exhibits no tenderness or deformity.  There is trace pitting edema around both ankles  Lymphadenopathy:    She has no cervical adenopathy.  Neurological: She is oriented to person, place, and time.  Skin: Skin is warm and dry. No rash noted. She is not diaphoretic. No erythema.  Vitals reviewed.   Lab Results  Component Value Date   WBC 9.3 05/26/2016   HGB 11.8 (L) 05/26/2016   HCT 36.4 05/26/2016   PLT 217 05/26/2016   GLUCOSE 228 (H) 07/02/2016   CHOL 268 (H) 12/31/2015   TRIG 90.0 12/31/2015   HDL 92.50 12/31/2015   LDLDIRECT 120.6 11/28/2013   LDLCALC 158 (H) 12/31/2015   ALT 44 (A) 04/03/2016   AST 26 04/03/2016   NA 140 07/02/2016   K 3.7 07/02/2016   CL 99 07/02/2016   CREATININE 1.97 (H) 07/02/2016   BUN 50 (H)  07/02/2016   CO2 30 07/02/2016   TSH 4.29 07/02/2016   INR 1.30 01/14/2016   HGBA1C 7.2 (H) 07/02/2016   MICROALBUR 2.4 (H) 12/31/2015    No results found.  Assessment & Plan:   Joanna [name] was seen today for hypothyroidism, diabetes and congestive heart failure.  Diagnoses and all orders for this visit:  Need for prophylactic vaccination and inoculation against influenza -     Flu vaccine HIGH DOSE PF (Fluzone High dose)  Essential hypertension- her blood pressure is well-controlled, electrolytes and renal function are stable. -     Magnesium; Future -     Basic metabolic panel; Future  Diabetes mellitus due to underlying condition with stage 1 chronic kidney disease, with long-term current use of insulin (HCC)- her A1c is 7.2%, given her advanced age and comorbid illnesses this is acceptable,  her daughter is doing a good job of using a sliding scale insulin to treat hyperglycemia. -     Basic metabolic panel; Future -     Hemoglobin A1c; Future  CKD (chronic kidney disease) stage 4, GFR 15-29 ml/min (HCC)- Her renal function has improved, she will continue avoid nephrotoxic agents -     Basic metabolic panel; Future  TSH elevation - her TFTs are normal now -     Thyroid Panel With TSH; Future  Need for shingles vaccine -     Varicella-zoster vaccine subcutaneous  PAF (paroxysmal atrial fibrillation) (HCC)- she has good rate and rhythm control, will continue aliquots -     apixaban (ELIQUIS) 2.5 MG TABS tablet; Take 1 tablet (2.5 mg total) by mouth 2 (two) times daily.   I am having Ms. Hinde maintain her ferrous sulfate, acetaminophen, hydrALAZINE, amiodarone, isosorbide mononitrate, Insulin Lispro, furosemide, metolazone, dextromethorphan-guaiFENesin, Insulin Glargine, milrinone, potassium chloride SA, levothyroxine, and apixaban.  Meds ordered this encounter  Medications  . apixaban (ELIQUIS) 2.5 MG TABS tablet    Sig: Take 1 tablet (2.5 mg total) by mouth 2 (two) times  daily.    Dispense:  180 tablet    Refill:  1     Follow-up: Return in about 4 months (around 11/01/2016).  Sanda Linger, MD

## 2016-07-02 NOTE — Patient Instructions (Signed)

## 2016-07-03 ENCOUNTER — Encounter: Payer: Self-pay | Admitting: Internal Medicine

## 2016-07-03 LAB — THYROID PANEL WITH TSH
Free Thyroxine Index: 3 (ref 1.4–3.8)
T3 Uptake: 35 % (ref 22–35)
T4, Total: 8.5 ug/dL (ref 4.5–12.0)
TSH: 4.29 mIU/L

## 2016-07-07 ENCOUNTER — Other Ambulatory Visit (HOSPITAL_COMMUNITY): Payer: Self-pay | Admitting: Internal Medicine

## 2016-07-08 LAB — CUP PACEART REMOTE DEVICE CHECK
Battery Voltage: 2.96 V
Date Time Interrogation Session: 20170919155702
Implantable Lead Location: 753859
Lead Channel Pacing Threshold Amplitude: 1.125 V
Lead Channel Pacing Threshold Pulse Width: 0.4 ms
Lead Channel Sensing Intrinsic Amplitude: 12 mV
Lead Channel Setting Pacing Amplitude: 2 V
Lead Channel Setting Pacing Amplitude: 2.25 V
Lead Channel Setting Pacing Pulse Width: 0.5 ms
MDC IDC LEAD IMPLANT DT: 20160601
MDC IDC LEAD IMPLANT DT: 20160601
MDC IDC LEAD IMPLANT DT: 20160601
MDC IDC LEAD LOCATION: 753858
MDC IDC LEAD LOCATION: 753860
MDC IDC MSMT BATTERY REMAINING LONGEVITY: 88 mo
MDC IDC MSMT BATTERY REMAINING PERCENTAGE: 95 % — AB
MDC IDC MSMT LEADCHNL LV IMPEDANCE VALUE: 430 Ohm
MDC IDC MSMT LEADCHNL LV PACING THRESHOLD AMPLITUDE: 0.625 V
MDC IDC MSMT LEADCHNL LV PACING THRESHOLD PULSEWIDTH: 0.5 ms
MDC IDC MSMT LEADCHNL RA IMPEDANCE VALUE: 400 Ohm
MDC IDC MSMT LEADCHNL RA PACING THRESHOLD AMPLITUDE: 1.25 V
MDC IDC MSMT LEADCHNL RA SENSING INTR AMPL: 1.6 mV
MDC IDC MSMT LEADCHNL RV IMPEDANCE VALUE: 450 Ohm
MDC IDC MSMT LEADCHNL RV PACING THRESHOLD PULSEWIDTH: 0.5 ms
MDC IDC SET LEADCHNL LV PACING PULSEWIDTH: 0.5 ms
MDC IDC SET LEADCHNL RV PACING AMPLITUDE: 2.125
MDC IDC STAT BRADY RA PERCENT PACED: 1 % — AB
Pulse Gen Model: 3242
Pulse Gen Serial Number: 7705940

## 2016-07-09 ENCOUNTER — Encounter: Payer: Self-pay | Admitting: Internal Medicine

## 2016-07-10 ENCOUNTER — Telehealth (HOSPITAL_COMMUNITY): Payer: Self-pay

## 2016-07-10 NOTE — Telephone Encounter (Signed)
[facility] RN called to report they were unsuccessful in getting venipuncture for lab draws (cbc diff, cmet, bnp, mag, lft) Patient refuses any further venipunctures from [facility] and would rather wait until seen in CHF clinic on Monday for labs to be drawn by our staff. Will add on for that appointment.  Ave Filter, RN

## 2016-07-14 ENCOUNTER — Ambulatory Visit (HOSPITAL_COMMUNITY)
Admission: RE | Admit: 2016-07-14 | Discharge: 2016-07-14 | Disposition: A | Discharge status: Home / Self Care | Payer: Medicare Other | Source: Ambulatory Visit | Attending: Internal Medicine | Admitting: Internal Medicine

## 2016-07-14 ENCOUNTER — Ambulatory Visit (HOSPITAL_BASED_OUTPATIENT_CLINIC_OR_DEPARTMENT_OTHER)
Admission: RE | Admit: 2016-07-14 | Discharge: 2016-07-14 | Disposition: A | Discharge status: Home / Self Care | Payer: Medicare Other | Source: Ambulatory Visit | Attending: Internal Medicine | Admitting: Internal Medicine

## 2016-07-14 VITALS — BP 110/62 | HR 79 | Wt 130.0 lb

## 2016-07-14 DIAGNOSIS — I5042 Chronic combined systolic (congestive) and diastolic (congestive) heart failure: Secondary | ICD-10-CM | POA: Diagnosis not present

## 2016-07-14 DIAGNOSIS — M1990 Unspecified osteoarthritis, unspecified site: Secondary | ICD-10-CM | POA: Insufficient documentation

## 2016-07-14 DIAGNOSIS — Z953 Presence of xenogenic heart valve: Secondary | ICD-10-CM | POA: Diagnosis not present

## 2016-07-14 DIAGNOSIS — E785 Hyperlipidemia, unspecified: Secondary | ICD-10-CM | POA: Diagnosis not present

## 2016-07-14 DIAGNOSIS — I5022 Chronic systolic (congestive) heart failure: Secondary | ICD-10-CM

## 2016-07-14 DIAGNOSIS — Z7901 Long term (current) use of anticoagulants: Secondary | ICD-10-CM | POA: Insufficient documentation

## 2016-07-14 DIAGNOSIS — Z66 Do not resuscitate: Secondary | ICD-10-CM | POA: Diagnosis not present

## 2016-07-14 DIAGNOSIS — I480 Paroxysmal atrial fibrillation: Secondary | ICD-10-CM | POA: Insufficient documentation

## 2016-07-14 DIAGNOSIS — I080 Rheumatic disorders of both mitral and aortic valves: Secondary | ICD-10-CM | POA: Diagnosis not present

## 2016-07-14 DIAGNOSIS — Z8673 Personal history of transient ischemic attack (TIA), and cerebral infarction without residual deficits: Secondary | ICD-10-CM | POA: Insufficient documentation

## 2016-07-14 DIAGNOSIS — I739 Peripheral vascular disease, unspecified: Secondary | ICD-10-CM | POA: Insufficient documentation

## 2016-07-14 DIAGNOSIS — Z794 Long term (current) use of insulin: Secondary | ICD-10-CM | POA: Diagnosis not present

## 2016-07-14 DIAGNOSIS — Z8619 Personal history of other infectious and parasitic diseases: Secondary | ICD-10-CM | POA: Diagnosis not present

## 2016-07-14 DIAGNOSIS — E1122 Type 2 diabetes mellitus with diabetic chronic kidney disease: Secondary | ICD-10-CM | POA: Insufficient documentation

## 2016-07-14 DIAGNOSIS — Z9889 Other specified postprocedural states: Secondary | ICD-10-CM | POA: Insufficient documentation

## 2016-07-14 DIAGNOSIS — I429 Cardiomyopathy, unspecified: Secondary | ICD-10-CM | POA: Insufficient documentation

## 2016-07-14 DIAGNOSIS — I130 Hypertensive heart and chronic kidney disease with heart failure and stage 1 through stage 4 chronic kidney disease, or unspecified chronic kidney disease: Secondary | ICD-10-CM | POA: Diagnosis not present

## 2016-07-14 DIAGNOSIS — N184 Chronic kidney disease, stage 4 (severe): Secondary | ICD-10-CM | POA: Insufficient documentation

## 2016-07-14 DIAGNOSIS — I6529 Occlusion and stenosis of unspecified carotid artery: Secondary | ICD-10-CM | POA: Insufficient documentation

## 2016-07-14 DIAGNOSIS — D509 Iron deficiency anemia, unspecified: Secondary | ICD-10-CM | POA: Diagnosis not present

## 2016-07-14 DIAGNOSIS — I447 Left bundle-branch block, unspecified: Secondary | ICD-10-CM | POA: Diagnosis not present

## 2016-07-14 DIAGNOSIS — Z515 Encounter for palliative care: Secondary | ICD-10-CM | POA: Insufficient documentation

## 2016-07-14 LAB — ECHOCARDIOGRAM COMPLETE
AO mean calculated velocity dopler: 118 cm/s
AV Mean grad: 7 mmHg
AV Peak grad: 14 mmHg
AV VEL mean LVOT/AV: 0.25
AV pk vel: 186 cm/s
Ao pk vel: 0.26 m/s
E decel time: 158 ms
E/e' ratio: 27.85
FS: 3 % — AB (ref 28–44)
IVS/LV PW RATIO, ED: 1.29
LA ID, A-P, ES: 45 mm
LA diam end sys: 45 mm
LA diam index: 2.73 cm/m2
LA vol A4C: 95.8 mL
LA vol index: 67.3 mL/m2
LA vol: 111 mL
LV E/e' medial: 27.85
LV E/e'average: 27.85
LV PW d: 11.7 mm — AB (ref 0.6–1.1)
LV e' LATERAL: 4.13 cm/s
LVOT VTI: 7.96 cm
LVOT peak VTI: 0.23 cm
LVOT peak vel: 47.5 cm/s
Lateral S' vel: 8.16 cm/s
MV Dec: 158
MV Peak grad: 5 mmHg
MV pk A vel: 99.9 m/s
MV pk E vel: 115 m/s
PV Reg grad dias: 17 mmHg
PV Reg vel dias: 204 cm/s
RV sys press: 50 mmHg
Reg peak vel: 296 cm/s
TDI e' lateral: 4.13
TDI e' medial: 5
TR max vel: 296 cm/s
VTI: 34.4 cm

## 2016-07-14 LAB — BASIC METABOLIC PANEL
ANION GAP: 10 (ref 5–15)
BUN: 69 mg/dL — AB (ref 6–20)
CALCIUM: 9.9 mg/dL (ref 8.9–10.3)
CO2: 29 mmol/L (ref 22–32)
CREATININE: 2.15 mg/dL — AB (ref 0.44–1.00)
Chloride: 99 mmol/L — ABNORMAL LOW (ref 101–111)
GFR calc Af Amer: 23 mL/min — ABNORMAL LOW (ref >60)
GFR, EST NON AFRICAN AMERICAN: 20 mL/min — AB (ref >60)
GLUCOSE: 168 mg/dL — AB (ref 65–99)
Potassium: 3.7 mmol/L (ref 3.5–5.1)
Sodium: 138 mmol/L (ref 135–145)

## 2016-07-14 NOTE — Progress Notes (Signed)
Patient ID: Joanna [name], female   DOB: 04/29/1930, 82 y.o.   MRN: [id number]    Advanced Heart Failure Clinic Note   PCP: Dr Yetta BarreJones ([facility] [place]) CVTS Surgeon: Dr. Cornelius Moraswen Primary Cardiologist: Dr. Gala RomneyBensimhon DNR/DNI   HPI: Joanna [name] is a 82 year old woman with aortic stenosis s/p AVR/mitral valve replacement (06/15/2013), systolic HF due to NICM, hypertension, diabetes mellitus type II, hyperlipidemia, and peripheral artery disease.  Underwent AVR/MVR with placement of epicardial lead on 06/15/13 with PAF post-op terminated with amiodarone.  Upgraded to [facility] Jude CRT-D 03/22/2015.    Admitted from 5/27 through 03/30/2015 with acute/chronic systolic HF and cardiogenic shock. She was diuresed with IV lasix and milrinone. Unable to wean milrinone and discharged on 0.25 mcg. While hospitalized CRT-D placed on 6/2. She was discharged to [facility].   Readmitted 6/11 with hypoglycemia. Also had sepsis with HCAP and received antibiotic course. She remained on milrinone 0.375 mcg. Discharge weight was 128 [name].   Admitted 09/24/15 from facility with fall. Xray showed left hip superior and inferior pubic ramus fracture. She was considered stable from a HF standpoint and compliant with medications. Her weight was 130 lbs, consistent with previous home weights.   Labs from 10/23/15 showed Creatinine had trended up slightly from 1.6 -> 1.8, and in speaking with her daughter pts weight had gone up slowly since going back to [facility] after her hip fracture. Her weight on 10/11/15 was 141 lbs. Appt made for 10/24/15 and instructed to take a metolazone that morning.   Presented to ED 01/14/16 s/p fall. Blood sugar had been dropping. Was in 4750s at ER. PCP has adjusted her diabetes regimen. She had struck her left lower leg, X-ray negative for acute findings.   She presents today for regular follow up. Remains on chronic milrinone 0.25 mcg/kg/min with biweekly labs. Volume status has been up and down. Weight ranges from  128 to 134. Daughter following volume status very closely. Takes metolazone about 2x/week as needed for weight 133 or greater. Breathing better. Edema better controlled. Wound has healed. No orthopnea or PND. Walking more with walker.   ICD interrogation: Volume status. No VT/AF  05/19/2013 ECHO EF 35% severe AS (low gradient severe AS confirmed by DSE), gradient: 32 mm Hg (S). Peak gradient: 57mm Hg  08/18/13 ECHO EF 45-50% AV working well 2/16 ECHO with EF 20-25%, moderately dilated LV with mild LVH, normal bioprosthetic mitral and aortic valves, moderate TR, PA systolic pressure 54 mmHg.  4/16 ECHO EF 20-25%, diffuse hypokinesis, mild LVH, bioprosthetic aortic valve functioning normally, bioprosthetic mitral valve with mild central MR and mean gradient 4 mmHg.  07/14/16: Echo EF 15% RV severely HK valves working ok (reviewed personally)  Labs 06/11/2015: K 4.1 Creatinine 1.9 08/21/2015: K 3.9 Creatinine 1.7   10/22/15 K 2.8, Creatinine 1.82 12/31/15 K 3.2, Creatinine 2.13 01/10/16 K 3.8, Creatinine 2.28 05/01/16 K 4.1, Creatinine 1.88  07/02/16  k 3.7 Creatinine 1.97  ROS:  Negative except for as mentioned in HPI and problem list.  Past Medical History:  Diagnosis Date  . Aortic stenosis     a. s/p AVR  . Arthritis   . BUNDLE BRANCH BLOCK, LEFT    . CAROTID ARTERY STENOSIS 01/05/2009  . Chronic combined systolic and diastolic CHF (congestive heart failure) (HCC)    a. RHC 09/2011 RA 8, RV 58/2/11; PA 54/22 (37) PCWP 20; F CO/CI 4.57/2.67 PVR 3.7; b. L main no sig dz, LAD luminal irreg; LCx lg Ramus with  luminal irreg, small AV Lcx witout sig dz, RCA luminal irreg; severe mitral annular calcifications; AV calcified c. EF 45-50% (07/2013);  d. 11/2014 Echo: EF 20-25%, diff HK, nl AV/MV, sev dil LA, mod TR/PR, PASP .  Marland Kitchen CVA 03/14/2010  . Diabetes mellitus   . Fracture, fibula, shaft 01/30/2015   right  . History of shingles   . HTN (hypertension)   . Hyperlipidemia       . Iron  deficiency anemia 09/21/2011  . Mitral regurgitation    a. s/p MVR  . Peripheral artery disease (HCC)   . PERIPHERAL VASCULAR DISEASE 07/30/2007  . S/P aortic valve replacement with bioprosthetic valve 06/15/2013   21 mm [facility] Ease bovine pericardial tissue valve  . S/P mitral valve replacement with bioprosthetic valve 06/15/2013   25 mm [facility] Mitral bovine pericardial tissue valve     Current Outpatient Prescriptions  Medication Sig Dispense Refill  . acetaminophen (TYLENOL) 500 MG tablet Take 500 mg by mouth every 6 (six) hours as needed (pain).     Marland Kitchen amiodarone (PACERONE) 200 MG tablet Take 1 tablet (200 mg total) by mouth daily. 90 tablet 3  . apixaban (ELIQUIS) 2.5 MG TABS tablet Take 1 tablet (2.5 mg total) by mouth 2 (two) times daily. 180 tablet 1  . dextromethorphan-guaiFENesin (ROBITUSSIN-DM) 10-100 MG/5ML liquid Take 10 mLs by mouth every 4 (four) hours as needed for cough. Reported on 02/27/2016    . ferrous sulfate 325 (65 FE) MG tablet Take 325 mg by mouth daily with breakfast.    . furosemide (LASIX) 80 MG tablet Take 1 tablet (80 mg total) by mouth daily. HOLD FOR WEIGHT LESS THAN 125LBS 30 tablet 6  . hydrALAZINE (APRESOLINE) 10 MG tablet Take 10 mg by mouth 2 (two) times daily.    . Insulin Glargine (TOUJEO SOLOSTAR) 300 UNIT/ML SOPN Inject 7-10 Units into the skin at bedtime.     . Insulin Lispro (HUMALOG KWIKPEN) 200 UNIT/ML SOPN Inject 3 Units into the skin 3 (three) times daily as needed (high blood sugar). Sliding scale    . isosorbide mononitrate (IMDUR) 30 MG 24 hr tablet Take 0.5 tablets (15 mg total) by mouth daily. 45 tablet 6  . levothyroxine (SYNTHROID, LEVOTHROID) 75 MCG tablet Take 1 tablet by mouth  daily 90 tablet 1  . metolazone (ZAROXOLYN) 2.5 MG tablet Take 2.5 mg by mouth daily as needed (blood pressure). Reported on 12/13/2015. Pt only used if there is a more than 3lb weight gain.    . milrinone (PRIMACOR) 20 MG/100ML SOLN infusion Inject  14.575 mcg/min into the vein continuous. 100 mL 0  . potassium chloride SA (K-DUR,KLOR-CON) 20 MEQ tablet Take 2 tablets by mouth two times daily 180 tablet 3   No current facility-administered medications for this encounter.    Social History   Social History  . Marital status: Divorced    Spouse name: N/A  . Number of children: N/A  . Years of education: N/A   Social History Main Topics  . Smoking status: Never Smoker  . Smokeless tobacco: Never Used  . Alcohol use No  . Drug use: No  . Sexual activity: No   Other Topics Concern  . Not on file   Social History Narrative   Patient lives alone here in town   She continues to work, helping to bind and oversew books   She is divorced after 23 years of marriage   1 son- '61, minister; 1 dtr '55; 4  grandchildren   End of life care-provided packet on living well   Family History  Problem Relation Age of Onset  . Colon cancer Mother   . Diabetes Neg Hx   . Coronary artery disease Neg Hx     Vitals:   07/14/16 1101  BP: 110/62  BP Location: Left Arm  Patient Position: Sitting  Cuff Size: Normal  Pulse: 79  SpO2: 99%  Weight: 130 lb (59 kg)     Wt Readings from Last 3 Encounters:  07/14/16 130 lb (59 kg)  07/02/16 130 lb 4 oz (59.1 kg)  05/12/16 128 lb 8 oz (58.3 kg)    PHYSICAL EXAM: General: Elderly. NAD; Daughter present. Ambulated into the clinic using cane without difficulty HEENT: Normal Neck: supple. JVP 7 cm with mild HJR. Carotids 2+ bilaterally; no bruits. No thyromegaly or nodule noted. Cor: PMI normal. RRR. 1/6 SEM. +S3.    Lungs: Clear Abdomen: soft, non-tender, non-distended, no HSM. No bruits or masses. +BS  Extremities: no cyanosis, clubbing, rash. RUE PICC. trace ankle edema.  Neuro: alert & orientedx3, cranial nerves grossly intact. Moves all 4 extremities w/o difficulty. Affect pleasant  ASSESSMENT & PLAN: 1. Chronic systolic CHF: EF 55-37% (4/16).  NICM.  EF LVEF 15% RV severe HK. On  chronic milrinone 0.25 mcg. Continue bi-weekly lab work.  - Stable NYHA III-IIIb on milrinone  - Volume status looks good today. Continue lasix 80 mg daily.  - Take metolazone 2.5 as needed to try and keep weight 129-131 Make sure she takes with extra 20 meq of potassium.  - Wear compression stockings - Continue hydralazine 10 mg BID and imdur 15 mg daily.  - No BB, ARB/ACE with AKI and hypotension. - Will draw labs today 2. Carotid stenosis:  - Dopplers 12/2014 40-59% RICA stenosis stable. Repeat as needed 3. Hyperlipidemia:  - Statin d/c'd in June as non-essential med with DNR/DNI status. - Last lipid check 12/2014. Stable. 4.  Aortic stenosis/mitral regurgitation:  - s/p bioprosthetic AVR/MVR stable last echo 01/2015.  - repeat echo in 2 months 5. CKD stage 4 - Stable. [facility] following with routine labs. Watch closely with diuretic changes 6. PAF:   - in NSR on exam.  - Continue apixaban 2.5 mg BID and amio 200 mg daily.  - No bleeding on Eliquis. 7. [facility] CRT-D  - Sees EP 8. DM, Insulin dependent - Per PCP.   Aina Rossbach,MD 11:10 AM

## 2016-07-14 NOTE — Patient Instructions (Signed)
Lab today  Your physician recommends that you schedule a follow-up appointment in: 3 months  

## 2016-07-14 NOTE — Progress Notes (Signed)
  Echocardiogram 2D Echocardiogram has been performed.  Joanna [name] 07/14/2016, 11:03 AM

## 2016-07-21 ENCOUNTER — Other Ambulatory Visit (HOSPITAL_COMMUNITY): Payer: Self-pay | Admitting: *Deleted

## 2016-07-22 ENCOUNTER — Encounter: Payer: Self-pay | Admitting: Internal Medicine

## 2016-07-24 ENCOUNTER — Other Ambulatory Visit (HOSPITAL_COMMUNITY): Payer: Self-pay | Admitting: Internal Medicine

## 2016-07-24 ENCOUNTER — Telehealth: Payer: Self-pay | Admitting: Cardiology

## 2016-07-24 NOTE — Telephone Encounter (Signed)
Pt and family called, out of hydralazine - I called in 1 month supply to [facility]  [phone] 10 MG PO bid.  They will call HF clinic in am to have script sent to mail order pharmacy.

## 2016-07-25 ENCOUNTER — Other Ambulatory Visit (HOSPITAL_COMMUNITY): Payer: Self-pay | Admitting: *Deleted

## 2016-07-25 MED ORDER — HYDRALAZINE HCL 10 MG PO TABS: 10.0000 mg | ORAL_TABLET | Freq: Two times a day (BID) | ORAL | 3 refills | Status: AC

## 2016-08-01 ENCOUNTER — Other Ambulatory Visit (HOSPITAL_COMMUNITY): Payer: Self-pay | Admitting: Internal Medicine

## 2016-08-13 ENCOUNTER — Other Ambulatory Visit: Payer: Self-pay | Admitting: Internal Medicine

## 2016-08-13 ENCOUNTER — Other Ambulatory Visit (HOSPITAL_COMMUNITY): Payer: Self-pay | Admitting: Internal Medicine

## 2016-08-13 DIAGNOSIS — E0821 Diabetes mellitus due to underlying condition with diabetic nephropathy: Secondary | ICD-10-CM

## 2016-08-15 ENCOUNTER — Other Ambulatory Visit (HOSPITAL_COMMUNITY): Payer: Self-pay | Admitting: Internal Medicine

## 2016-08-19 ENCOUNTER — Other Ambulatory Visit (HOSPITAL_COMMUNITY): Payer: Self-pay | Admitting: Internal Medicine

## 2016-08-25 ENCOUNTER — Telehealth (HOSPITAL_COMMUNITY): Payer: Self-pay | Admitting: Cardiology

## 2016-08-25 NOTE — Telephone Encounter (Signed)
Abnormal labs received form [facility] drawn 08/21/16 k 5.4  Per vo Amy Clegg,NP Patient should stop potassium   Patients daughter aware and voiced understanding

## 2016-09-01 ENCOUNTER — Encounter (HOSPITAL_COMMUNITY): Payer: Self-pay | Admitting: *Deleted

## 2016-09-01 NOTE — Progress Notes (Signed)
DC completed and signed by Dr Gala Romney, Ivor Messier & Dick aware to p/u

## 2016-09-04 ENCOUNTER — Telehealth: Payer: Self-pay | Admitting: *Deleted

## 2016-09-04 NOTE — Telephone Encounter (Signed)
Patient's daughter, [name], called to request a return kit for patient's Merlin monitor as she is now deceased.  Return kit ordered, should arrive in 7-10 business days.  Patient removed from [facility] remote monitoring.  Routed to Dr. Caryl Comes for review.

## 2016-09-05 ENCOUNTER — Other Ambulatory Visit (HOSPITAL_COMMUNITY): Payer: Self-pay | Admitting: Internal Medicine

## 2016-09-05 NOTE — Telephone Encounter (Signed)
Note:

## 2016-09-19 DEATH — deceased

## 2016-10-01 ENCOUNTER — Other Ambulatory Visit (HOSPITAL_COMMUNITY): Payer: Self-pay | Admitting: Student

## 2016-10-10 ENCOUNTER — Encounter (HOSPITAL_COMMUNITY): Payer: Medicare Other | Admitting: Internal Medicine

## 2016-11-03 ENCOUNTER — Ambulatory Visit: Payer: Medicare Other | Admitting: Internal Medicine

## 2016-11-19 IMAGING — CR DG PELVIS 1-2V
1 series · 1 of 1 positions shown · non-contrast
Comparison: None.

ADDENDUM:
Upon further review, there are left superior and inferior pubic rami
fractures noted. Mild displacement of the superior pubic ramus
fracture near the pubic symphysis.
CLINICAL DATA: Fall today.  Left hip pain

EXAM:
PELVIS - 1-2 VIEW

[pelvis ap]
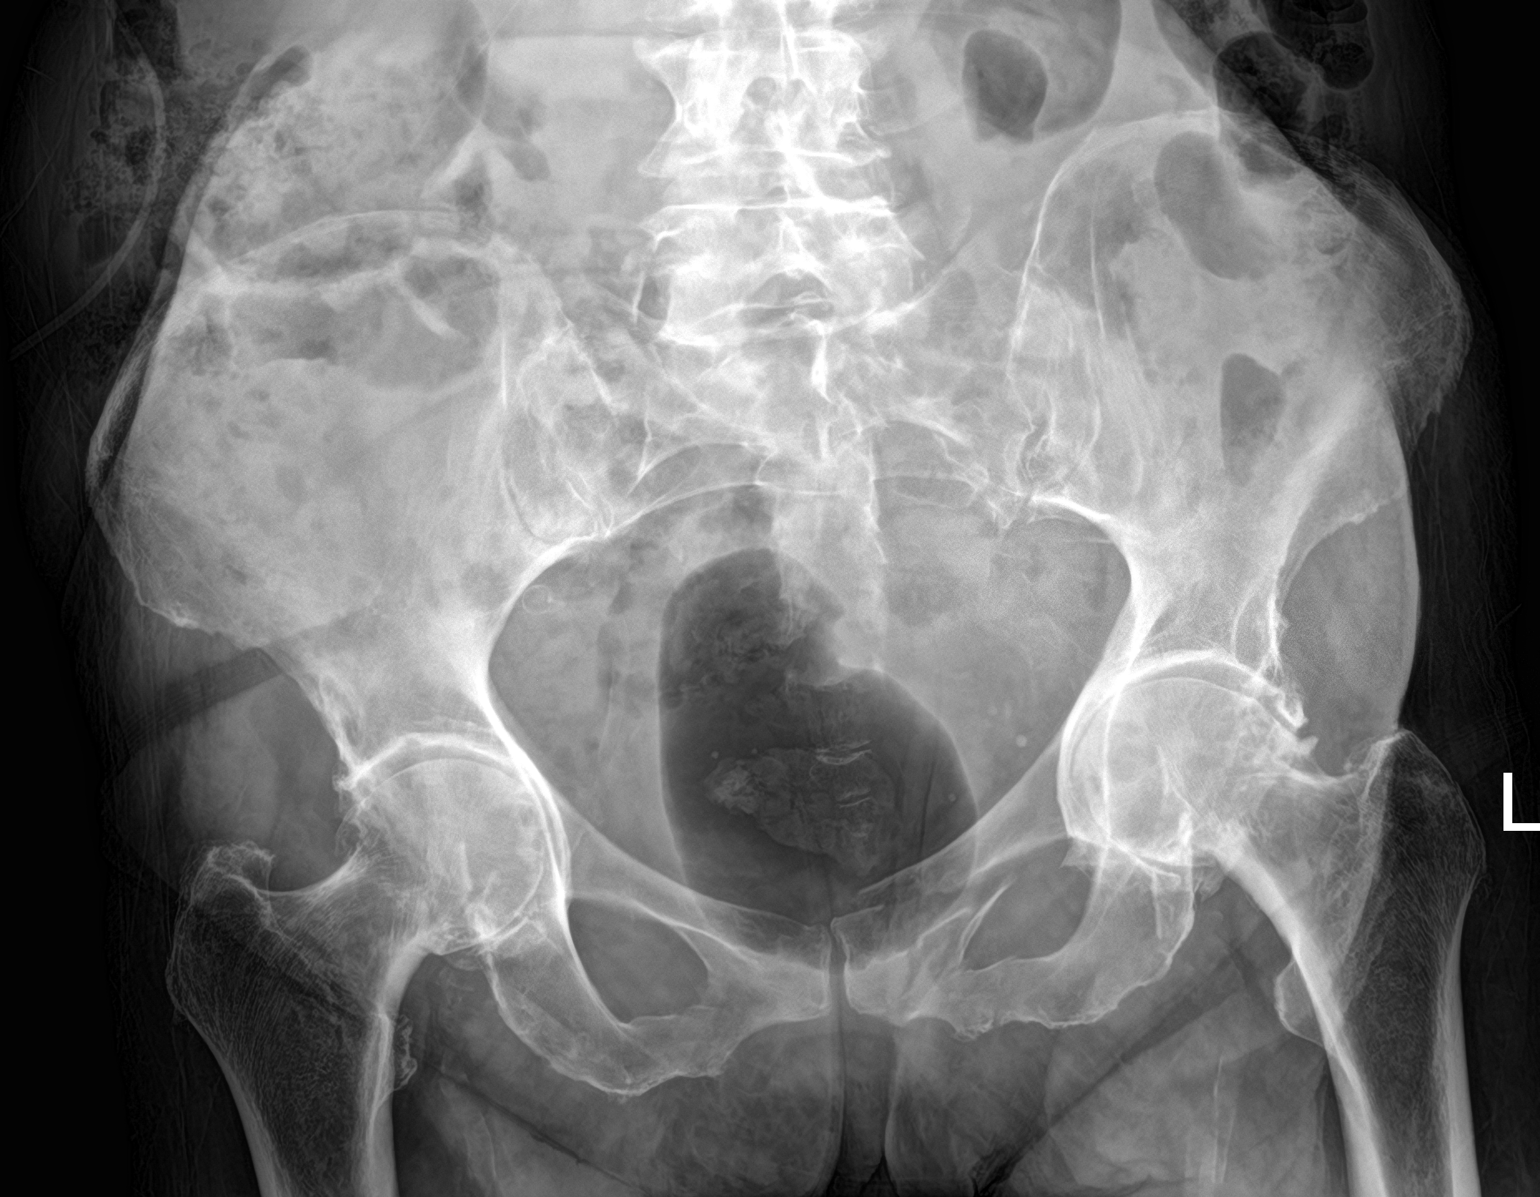

[1 of 1 positions shown; findings below may reference images not displayed]

FINDINGS: Advanced degenerative changes in the hips bilaterally. SI joints are
symmetric and unremarkable. No acute bony abnormality. Specifically,
no fracture, subluxation, or dislocation. Soft tissues are intact.
IMPRESSION: No acute bony abnormality.

## 2016-11-19 IMAGING — DX DG CHEST 1V
1 series · 1 of 1 positions shown · non-contrast
Comparison: Chest radiograph dated 08/16/2015

CLINICAL DATA: 85-year-old female status post fall with left hip
pain. No chest complaint.

EXAM:
CHEST 1 VIEW

[chest pa]
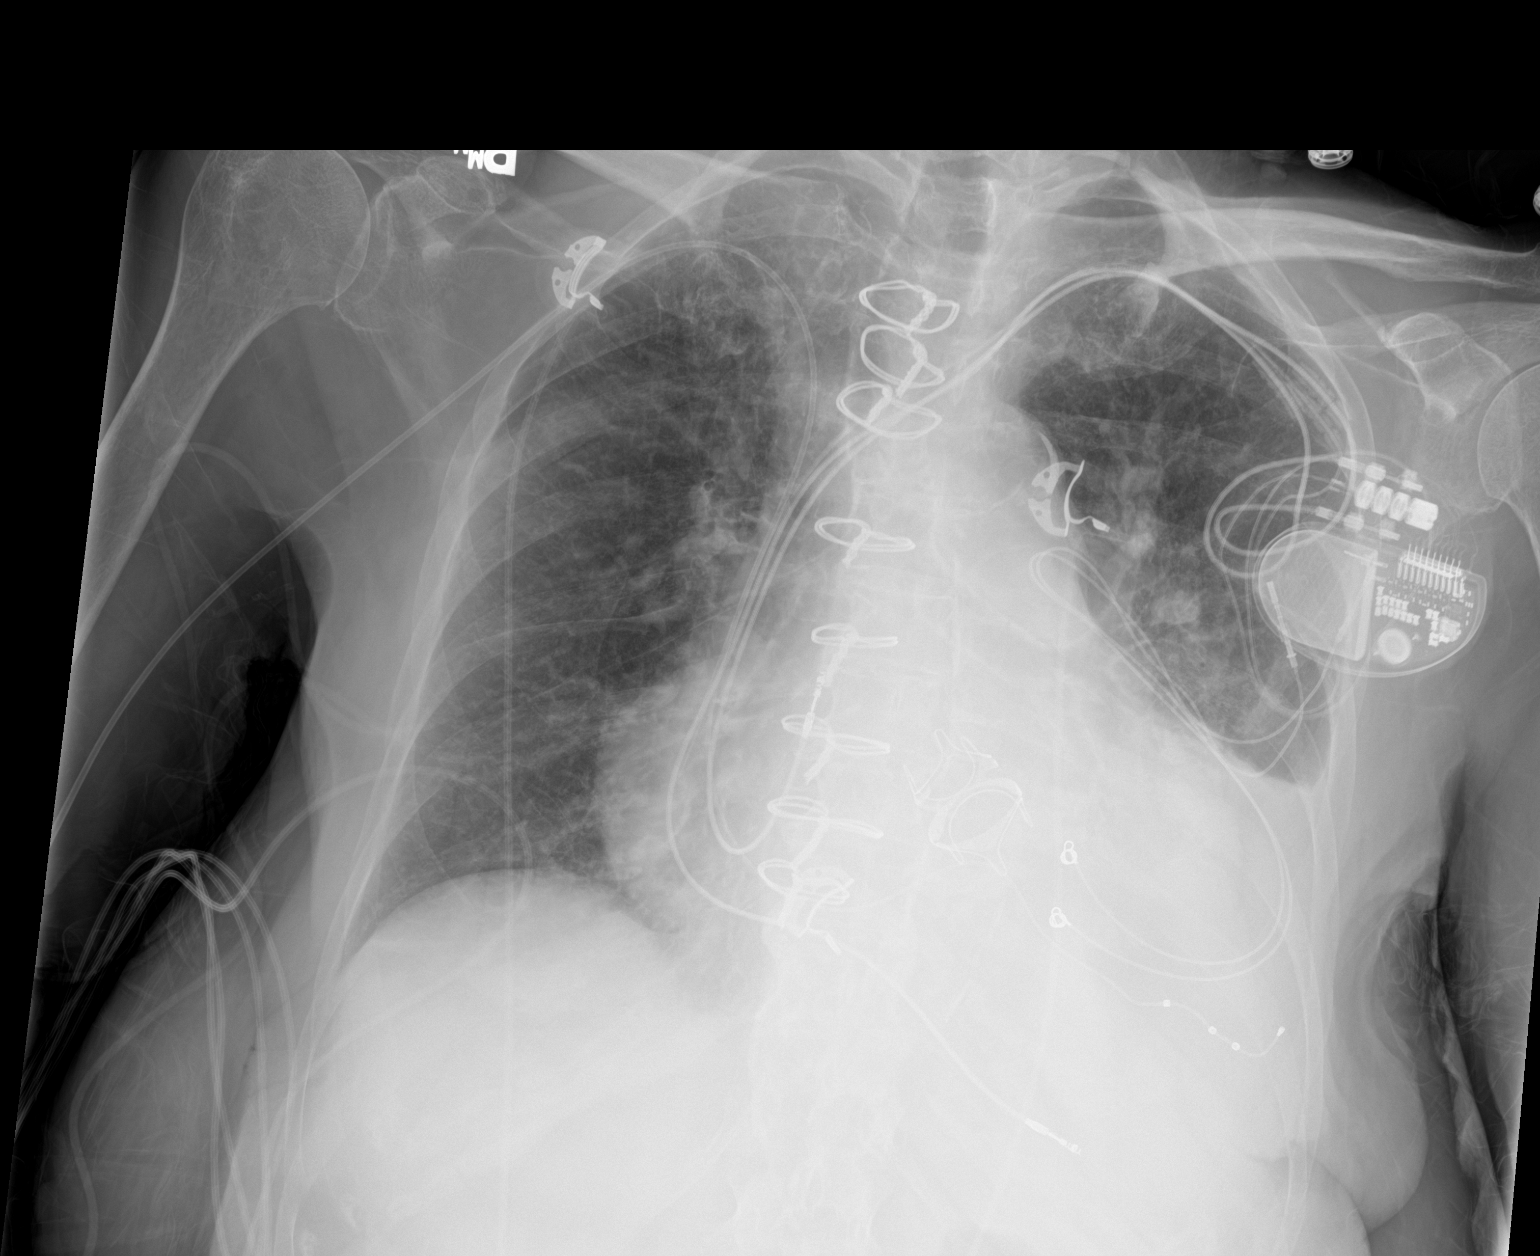

[1 of 1 positions shown; findings below may reference images not displayed]

FINDINGS: Right-sided PICC appears in stable positioning. Single-view of the
chest demonstrate emphysematous changes of the lungs. There is an
area of consolidation in the left lower lobe with silhouetting of
the left hemidiaphragm similar to the prior study. This may be
related to an enlarged heart with associated partial compressive
atelectasis of the left lower lobe. Pneumonia or underlying mass is
not excluded. Clinical correlation is recommended. Stable
cardiomegaly. Mechanical cardiac valve. The osseous structures are
grossly unremarkable.
IMPRESSION: Stable cardiomegaly with an area of retrocardiac opacity at the left
lung base which may represent atelectasis versus pneumonia. Findings
are grossly stable compared the prior study.
